# Patient Record
Sex: Male | Born: 1940 | Race: White | Hispanic: No | Marital: Married | Smoking: Former smoker
Health system: Southern US, Community
[De-identification: ages and names within clinical notes are randomized; demographics above are authoritative.]

## PROBLEM LIST (undated history)

## (undated) DIAGNOSIS — K219 Gastro-esophageal reflux disease without esophagitis: Secondary | ICD-10-CM

## (undated) DIAGNOSIS — N400 Enlarged prostate without lower urinary tract symptoms: Secondary | ICD-10-CM

## (undated) DIAGNOSIS — Z7901 Long term (current) use of anticoagulants: Secondary | ICD-10-CM

## (undated) DIAGNOSIS — I714 Abdominal aortic aneurysm, without rupture, unspecified: Secondary | ICD-10-CM

## (undated) DIAGNOSIS — Z9889 Other specified postprocedural states: Secondary | ICD-10-CM

## (undated) DIAGNOSIS — G43909 Migraine, unspecified, not intractable, without status migrainosus: Secondary | ICD-10-CM

## (undated) DIAGNOSIS — R001 Bradycardia, unspecified: Secondary | ICD-10-CM

## (undated) DIAGNOSIS — I10 Essential (primary) hypertension: Secondary | ICD-10-CM

## (undated) DIAGNOSIS — I351 Nonrheumatic aortic (valve) insufficiency: Secondary | ICD-10-CM

## (undated) DIAGNOSIS — K5790 Diverticulosis of intestine, part unspecified, without perforation or abscess without bleeding: Secondary | ICD-10-CM

## (undated) HISTORY — DX: Benign prostatic hyperplasia without lower urinary tract symptoms: N40.0

## (undated) HISTORY — DX: Essential (primary) hypertension: I10

## (undated) HISTORY — DX: Long term (current) use of anticoagulants: Z79.01

## (undated) HISTORY — DX: Gastro-esophageal reflux disease without esophagitis: K21.9

## (undated) HISTORY — PX: PROSTATE ABLATION: SHX6042

## (undated) HISTORY — DX: Migraine, unspecified, not intractable, without status migrainosus: G43.909

## (undated) HISTORY — DX: Bradycardia, unspecified: R00.1

## (undated) HISTORY — PX: TONSILLECTOMY: SUR1361

## (undated) HISTORY — DX: Abdominal aortic aneurysm, without rupture, unspecified: I71.40

## (undated) HISTORY — DX: Nonrheumatic aortic (valve) insufficiency: I35.1

## (undated) HISTORY — DX: Other specified postprocedural states: Z98.890

## (undated) HISTORY — PX: COLONOSCOPY: SHX174

## (undated) HISTORY — DX: Diverticulosis of intestine, part unspecified, without perforation or abscess without bleeding: K57.90

## (undated) HISTORY — PX: AORTIC VALVE REPLACEMENT: SHX41

## (undated) MED FILL — Decitabine For Inj 50 MG: INTRAVENOUS | Qty: 8 | Status: AC

---

## 1969-02-24 HISTORY — PX: RECONSTRUCTION MID-FACE: SUR1085

## 1999-12-24 ENCOUNTER — Encounter: Payer: Self-pay | Admitting: Cardiology

## 1999-12-24 ENCOUNTER — Encounter: Admission: RE | Admit: 1999-12-24 | Discharge: 1999-12-24 | Payer: Self-pay | Admitting: Cardiology

## 2000-12-24 ENCOUNTER — Encounter: Payer: Self-pay | Admitting: Internal Medicine

## 2000-12-24 ENCOUNTER — Ambulatory Visit (HOSPITAL_COMMUNITY): Admission: RE | Admit: 2000-12-24 | Discharge: 2000-12-24 | Payer: Self-pay | Admitting: Internal Medicine

## 2001-02-02 ENCOUNTER — Encounter: Admission: RE | Admit: 2001-02-02 | Discharge: 2001-02-02 | Payer: Self-pay | Admitting: Surgery

## 2001-02-02 ENCOUNTER — Encounter: Payer: Self-pay | Admitting: Surgery

## 2001-03-01 ENCOUNTER — Encounter: Payer: Self-pay | Admitting: Surgery

## 2001-03-02 ENCOUNTER — Inpatient Hospital Stay (HOSPITAL_COMMUNITY): Admission: RE | Admit: 2001-03-02 | Discharge: 2001-03-07 | Payer: Self-pay | Admitting: Cardiology

## 2001-03-02 ENCOUNTER — Encounter (INDEPENDENT_AMBULATORY_CARE_PROVIDER_SITE_OTHER): Payer: Self-pay | Admitting: *Deleted

## 2001-03-03 ENCOUNTER — Encounter: Payer: Self-pay | Admitting: Surgery

## 2001-03-04 ENCOUNTER — Encounter: Payer: Self-pay | Admitting: Surgery

## 2001-03-05 ENCOUNTER — Encounter: Payer: Self-pay | Admitting: Surgery

## 2001-03-23 ENCOUNTER — Encounter (HOSPITAL_COMMUNITY): Admission: RE | Admit: 2001-03-23 | Discharge: 2001-05-26 | Payer: Self-pay | Admitting: Cardiology

## 2001-03-24 ENCOUNTER — Encounter: Payer: Self-pay | Admitting: Cardiology

## 2001-03-24 ENCOUNTER — Ambulatory Visit (HOSPITAL_COMMUNITY): Admission: RE | Admit: 2001-03-24 | Discharge: 2001-03-24 | Payer: Self-pay | Admitting: Cardiology

## 2001-06-02 ENCOUNTER — Encounter (HOSPITAL_COMMUNITY): Admission: RE | Admit: 2001-06-02 | Discharge: 2001-08-31 | Payer: Self-pay | Admitting: Cardiology

## 2001-09-01 ENCOUNTER — Encounter (HOSPITAL_COMMUNITY): Admission: RE | Admit: 2001-09-01 | Discharge: 2001-11-30 | Payer: Self-pay | Admitting: Cardiology

## 2001-12-25 ENCOUNTER — Encounter (HOSPITAL_COMMUNITY): Admission: RE | Admit: 2001-12-25 | Discharge: 2002-03-25 | Payer: Self-pay | Admitting: Cardiology

## 2004-01-10 ENCOUNTER — Ambulatory Visit: Payer: Self-pay | Admitting: Internal Medicine

## 2004-01-15 ENCOUNTER — Ambulatory Visit: Payer: Self-pay | Admitting: Internal Medicine

## 2004-01-23 ENCOUNTER — Ambulatory Visit: Payer: Self-pay | Admitting: Cardiovascular Disease

## 2004-01-23 ENCOUNTER — Ambulatory Visit: Payer: Self-pay | Admitting: Cardiology

## 2004-01-26 ENCOUNTER — Ambulatory Visit: Payer: Self-pay

## 2004-02-01 ENCOUNTER — Ambulatory Visit: Payer: Self-pay | Admitting: Cardiology

## 2004-02-15 ENCOUNTER — Ambulatory Visit: Payer: Self-pay | Admitting: Internal Medicine

## 2004-02-28 ENCOUNTER — Ambulatory Visit: Payer: Self-pay | Admitting: Cardiology

## 2004-03-04 ENCOUNTER — Ambulatory Visit: Payer: Self-pay | Admitting: Internal Medicine

## 2004-03-07 ENCOUNTER — Ambulatory Visit: Payer: Self-pay | Admitting: Internal Medicine

## 2004-03-14 ENCOUNTER — Ambulatory Visit: Payer: Self-pay | Admitting: Internal Medicine

## 2004-04-15 ENCOUNTER — Ambulatory Visit: Payer: Self-pay | Admitting: Cardiology

## 2004-04-29 ENCOUNTER — Ambulatory Visit: Payer: Self-pay | Admitting: Cardiology

## 2004-05-10 ENCOUNTER — Ambulatory Visit: Payer: Self-pay | Admitting: Internal Medicine

## 2004-05-28 ENCOUNTER — Ambulatory Visit: Payer: Self-pay | Admitting: Cardiology

## 2004-06-20 ENCOUNTER — Ambulatory Visit: Payer: Self-pay | Admitting: Cardiology

## 2004-07-10 ENCOUNTER — Ambulatory Visit: Payer: Self-pay | Admitting: Internal Medicine

## 2004-07-24 ENCOUNTER — Ambulatory Visit: Payer: Self-pay | Admitting: Cardiology

## 2004-08-21 ENCOUNTER — Ambulatory Visit: Payer: Self-pay | Admitting: Cardiology

## 2004-09-20 ENCOUNTER — Ambulatory Visit: Payer: Self-pay | Admitting: Cardiology

## 2004-10-04 ENCOUNTER — Ambulatory Visit: Payer: Self-pay | Admitting: Cardiology

## 2004-10-30 ENCOUNTER — Ambulatory Visit: Payer: Self-pay | Admitting: Cardiology

## 2004-11-22 ENCOUNTER — Ambulatory Visit: Payer: Self-pay | Admitting: Cardiology

## 2004-12-19 ENCOUNTER — Ambulatory Visit: Payer: Self-pay | Admitting: Cardiology

## 2004-12-19 ENCOUNTER — Ambulatory Visit: Payer: Self-pay | Admitting: Internal Medicine

## 2004-12-25 ENCOUNTER — Ambulatory Visit: Payer: Self-pay | Admitting: Internal Medicine

## 2004-12-27 ENCOUNTER — Ambulatory Visit: Payer: Self-pay | Admitting: Cardiology

## 2005-01-24 ENCOUNTER — Ambulatory Visit: Payer: Self-pay | Admitting: Internal Medicine

## 2005-01-31 ENCOUNTER — Ambulatory Visit: Payer: Self-pay | Admitting: Cardiology

## 2005-02-14 ENCOUNTER — Ambulatory Visit: Payer: Self-pay | Admitting: Cardiology

## 2005-02-28 ENCOUNTER — Ambulatory Visit: Payer: Self-pay | Admitting: Cardiology

## 2005-03-19 ENCOUNTER — Ambulatory Visit: Payer: Self-pay | Admitting: Internal Medicine

## 2005-03-28 ENCOUNTER — Ambulatory Visit: Payer: Self-pay | Admitting: Cardiology

## 2005-04-11 ENCOUNTER — Ambulatory Visit: Payer: Self-pay | Admitting: Internal Medicine

## 2005-04-28 ENCOUNTER — Ambulatory Visit: Payer: Self-pay | Admitting: Cardiology

## 2005-05-12 ENCOUNTER — Ambulatory Visit: Payer: Self-pay | Admitting: Cardiology

## 2005-05-29 ENCOUNTER — Ambulatory Visit: Payer: Self-pay | Admitting: Cardiology

## 2005-06-09 ENCOUNTER — Ambulatory Visit: Payer: Self-pay | Admitting: Endocrinology

## 2005-06-13 ENCOUNTER — Ambulatory Visit: Payer: Self-pay | Admitting: Internal Medicine

## 2005-06-26 ENCOUNTER — Ambulatory Visit: Payer: Self-pay | Admitting: Cardiology

## 2005-07-24 ENCOUNTER — Ambulatory Visit: Payer: Self-pay | Admitting: Cardiology

## 2005-08-15 ENCOUNTER — Ambulatory Visit: Payer: Self-pay | Admitting: Cardiology

## 2005-09-08 ENCOUNTER — Ambulatory Visit: Payer: Self-pay | Admitting: Cardiology

## 2005-09-19 ENCOUNTER — Ambulatory Visit: Payer: Self-pay | Admitting: Internal Medicine

## 2005-10-03 ENCOUNTER — Ambulatory Visit: Payer: Self-pay | Admitting: Internal Medicine

## 2005-10-17 ENCOUNTER — Ambulatory Visit: Payer: Self-pay | Admitting: Cardiology

## 2005-10-30 ENCOUNTER — Ambulatory Visit: Payer: Self-pay | Admitting: Cardiology

## 2005-11-20 ENCOUNTER — Ambulatory Visit: Payer: Self-pay | Admitting: Cardiology

## 2005-12-04 ENCOUNTER — Ambulatory Visit: Payer: Self-pay | Admitting: Cardiology

## 2005-12-17 ENCOUNTER — Ambulatory Visit: Payer: Self-pay | Admitting: Internal Medicine

## 2006-01-05 ENCOUNTER — Ambulatory Visit: Payer: Self-pay | Admitting: Cardiology

## 2006-01-12 ENCOUNTER — Ambulatory Visit: Payer: Self-pay | Admitting: Internal Medicine

## 2006-01-12 LAB — CONVERTED CEMR LAB
AST: 32 units/L (ref 0–37)
Albumin: 3.9 g/dL (ref 3.5–5.2)
BUN: 14 mg/dL (ref 6–23)
Calcium: 9.1 mg/dL (ref 8.4–10.5)
Chol/HDL Ratio, serum: 3.2
Cholesterol: 143 mg/dL (ref 0–200)
Creatinine, Ser: 1.3 mg/dL (ref 0.4–1.5)
Eosinophil percent: 2.1 % (ref 0.0–5.0)
HCT: 47.6 % (ref 39.0–52.0)
HDL: 45.3 mg/dL (ref >39.0)
Hemoglobin: 15.9 g/dL (ref 13.0–17.0)
LDL Cholesterol: 82 mg/dL (ref 0–99)
Lymphocytes Relative: 14 % (ref 12.0–46.0)
MCHC: 33.5 g/dL (ref 30.0–36.0)
Neutrophils Relative %: 75.2 % (ref 43.0–77.0)
Nitrite: NEGATIVE
PSA: 6.09 ng/mL — ABNORMAL HIGH (ref 0.10–4.00)
Potassium: 4.4 meq/L (ref 3.5–5.1)
RDW: 12 % (ref 11.5–14.6)
Total Bilirubin: 1.1 mg/dL (ref 0.3–1.2)
Total Protein: 6.1 g/dL (ref 6.0–8.3)
Triglyceride fasting, serum: 80 mg/dL (ref 0–149)
Urine Glucose: NEGATIVE mg/dL

## 2006-01-19 ENCOUNTER — Ambulatory Visit: Payer: Self-pay | Admitting: Internal Medicine

## 2006-01-20 ENCOUNTER — Ambulatory Visit: Payer: Self-pay | Admitting: Cardiology

## 2006-01-27 ENCOUNTER — Encounter: Payer: Self-pay | Admitting: Internal Medicine

## 2006-01-27 ENCOUNTER — Ambulatory Visit: Payer: Self-pay

## 2006-02-10 ENCOUNTER — Ambulatory Visit: Payer: Self-pay | Admitting: Cardiology

## 2006-02-13 ENCOUNTER — Ambulatory Visit: Payer: Self-pay | Admitting: Internal Medicine

## 2006-02-16 ENCOUNTER — Ambulatory Visit (HOSPITAL_COMMUNITY): Admission: RE | Admit: 2006-02-16 | Discharge: 2006-02-16 | Payer: Self-pay | Admitting: Internal Medicine

## 2006-02-26 ENCOUNTER — Ambulatory Visit: Payer: Self-pay | Admitting: Cardiology

## 2006-03-09 ENCOUNTER — Encounter (INDEPENDENT_AMBULATORY_CARE_PROVIDER_SITE_OTHER): Payer: Self-pay | Admitting: *Deleted

## 2006-03-09 ENCOUNTER — Ambulatory Visit (HOSPITAL_COMMUNITY): Admission: RE | Admit: 2006-03-09 | Discharge: 2006-03-09 | Payer: Self-pay | Admitting: Urology

## 2006-03-11 ENCOUNTER — Ambulatory Visit: Payer: Self-pay | Admitting: Internal Medicine

## 2006-03-17 ENCOUNTER — Ambulatory Visit: Payer: Self-pay | Admitting: Cardiology

## 2006-03-23 ENCOUNTER — Ambulatory Visit: Payer: Self-pay | Admitting: Cardiology

## 2006-04-06 ENCOUNTER — Ambulatory Visit: Payer: Self-pay | Admitting: Cardiology

## 2006-04-17 ENCOUNTER — Ambulatory Visit: Payer: Self-pay | Admitting: Internal Medicine

## 2006-05-01 ENCOUNTER — Ambulatory Visit: Payer: Self-pay | Admitting: Internal Medicine

## 2006-05-01 ENCOUNTER — Ambulatory Visit: Payer: Self-pay | Admitting: Cardiology

## 2006-05-22 ENCOUNTER — Ambulatory Visit: Payer: Self-pay | Admitting: Cardiology

## 2006-06-18 ENCOUNTER — Ambulatory Visit (HOSPITAL_COMMUNITY): Admission: RE | Admit: 2006-06-18 | Discharge: 2006-06-18 | Payer: Self-pay | Admitting: Internal Medicine

## 2006-06-18 ENCOUNTER — Ambulatory Visit: Payer: Self-pay | Admitting: Internal Medicine

## 2006-06-19 ENCOUNTER — Ambulatory Visit: Payer: Self-pay | Admitting: Cardiology

## 2006-07-08 ENCOUNTER — Ambulatory Visit: Payer: Self-pay | Admitting: Internal Medicine

## 2006-07-17 ENCOUNTER — Ambulatory Visit: Payer: Self-pay | Admitting: Cardiovascular Disease

## 2006-08-14 ENCOUNTER — Ambulatory Visit: Payer: Self-pay | Admitting: Cardiology

## 2006-09-11 ENCOUNTER — Ambulatory Visit: Payer: Self-pay | Admitting: Cardiovascular Disease

## 2006-10-13 ENCOUNTER — Ambulatory Visit: Payer: Self-pay | Admitting: Cardiology

## 2006-11-10 ENCOUNTER — Ambulatory Visit: Payer: Self-pay | Admitting: Cardiology

## 2006-11-18 ENCOUNTER — Encounter: Payer: Self-pay | Admitting: *Deleted

## 2006-11-18 DIAGNOSIS — R972 Elevated prostate specific antigen [PSA]: Secondary | ICD-10-CM | POA: Insufficient documentation

## 2006-11-18 DIAGNOSIS — G43909 Migraine, unspecified, not intractable, without status migrainosus: Secondary | ICD-10-CM | POA: Insufficient documentation

## 2006-11-18 DIAGNOSIS — I10 Essential (primary) hypertension: Secondary | ICD-10-CM | POA: Insufficient documentation

## 2006-11-18 DIAGNOSIS — E785 Hyperlipidemia, unspecified: Secondary | ICD-10-CM | POA: Insufficient documentation

## 2006-12-08 ENCOUNTER — Ambulatory Visit: Payer: Self-pay | Admitting: Cardiovascular Disease

## 2006-12-28 ENCOUNTER — Ambulatory Visit: Payer: Self-pay | Admitting: Internal Medicine

## 2006-12-28 LAB — CONVERTED CEMR LAB
ALT: 36 units/L (ref 0–53)
Bilirubin Urine: NEGATIVE
Bilirubin, Direct: 0.1 mg/dL (ref 0.0–0.3)
Calcium: 9.5 mg/dL (ref 8.4–10.5)
Cholesterol: 168 mg/dL (ref 0–200)
Eosinophils Absolute: 0.1 10*3/uL (ref 0.0–0.6)
Eosinophils Relative: 1.4 % (ref 0.0–5.0)
GFR calc Af Amer: 78 mL/min
GFR calc non Af Amer: 65 mL/min
Glucose, Bld: 96 mg/dL (ref 70–99)
HDL: 52.1 mg/dL (ref >39.0)
Hemoglobin, Urine: NEGATIVE
Lymphocytes Relative: 11.3 % — ABNORMAL LOW (ref 12.0–46.0)
MCV: 93.8 fL (ref 78.0–100.0)
Monocytes Relative: 9.3 % (ref 3.0–11.0)
Neutro Abs: 5.9 10*3/uL (ref 1.4–7.7)
PSA: 2.65 ng/mL (ref 0.10–4.00)
Platelets: 191 10*3/uL (ref 150–400)
TSH: 0.68 microintl units/mL (ref 0.35–5.50)
Total CHOL/HDL Ratio: 3.2
Triglycerides: 139 mg/dL (ref 0–149)
Urine Glucose: NEGATIVE mg/dL
WBC: 7.5 10*3/uL (ref 4.5–10.5)

## 2007-01-08 ENCOUNTER — Ambulatory Visit: Payer: Self-pay | Admitting: Cardiovascular Disease

## 2007-01-11 ENCOUNTER — Ambulatory Visit: Payer: Self-pay | Admitting: Cardiology

## 2007-02-05 ENCOUNTER — Ambulatory Visit: Payer: Self-pay | Admitting: Internal Medicine

## 2007-03-05 ENCOUNTER — Ambulatory Visit: Payer: Self-pay | Admitting: Cardiology

## 2007-03-12 ENCOUNTER — Ambulatory Visit: Payer: Self-pay | Admitting: Internal Medicine

## 2007-03-12 DIAGNOSIS — R519 Headache, unspecified: Secondary | ICD-10-CM | POA: Insufficient documentation

## 2007-03-12 DIAGNOSIS — N400 Enlarged prostate without lower urinary tract symptoms: Secondary | ICD-10-CM | POA: Insufficient documentation

## 2007-03-12 DIAGNOSIS — R51 Headache: Secondary | ICD-10-CM

## 2007-04-07 ENCOUNTER — Ambulatory Visit: Payer: Self-pay | Admitting: Cardiovascular Disease

## 2007-05-11 ENCOUNTER — Ambulatory Visit: Payer: Self-pay | Admitting: Cardiology

## 2007-06-08 ENCOUNTER — Ambulatory Visit: Payer: Self-pay | Admitting: Cardiology

## 2007-07-06 ENCOUNTER — Ambulatory Visit: Payer: Self-pay | Admitting: Cardiology

## 2007-07-23 ENCOUNTER — Ambulatory Visit: Payer: Self-pay | Admitting: Cardiology

## 2007-07-26 ENCOUNTER — Ambulatory Visit: Payer: Self-pay | Admitting: Cardiology

## 2007-07-29 ENCOUNTER — Telehealth (INDEPENDENT_AMBULATORY_CARE_PROVIDER_SITE_OTHER): Payer: Self-pay | Admitting: *Deleted

## 2007-08-13 ENCOUNTER — Ambulatory Visit: Payer: Self-pay | Admitting: Cardiology

## 2007-09-10 ENCOUNTER — Ambulatory Visit: Payer: Self-pay | Admitting: Cardiovascular Disease

## 2007-09-13 ENCOUNTER — Ambulatory Visit: Payer: Self-pay | Admitting: Internal Medicine

## 2007-09-13 DIAGNOSIS — N281 Cyst of kidney, acquired: Secondary | ICD-10-CM | POA: Insufficient documentation

## 2007-09-13 DIAGNOSIS — M79609 Pain in unspecified limb: Secondary | ICD-10-CM | POA: Insufficient documentation

## 2007-09-14 ENCOUNTER — Encounter: Payer: Self-pay | Admitting: Internal Medicine

## 2007-09-15 LAB — CONVERTED CEMR LAB
BUN: 12 mg/dL (ref 6–23)
Basophils Relative: 1.1 % (ref 0.0–3.0)
CO2: 32 meq/L (ref 19–32)
Chloride: 106 meq/L (ref 96–112)
Creatinine, Ser: 1.2 mg/dL (ref 0.4–1.5)
Eosinophils Relative: 2.6 % (ref 0.0–5.0)
Glucose, Bld: 72 mg/dL (ref 70–99)
Lymphocytes Relative: 11.8 % — ABNORMAL LOW (ref 12.0–46.0)
MCV: 95.4 fL (ref 78.0–100.0)
Monocytes Relative: 6.6 % (ref 3.0–12.0)
Neutrophils Relative %: 77.9 % — ABNORMAL HIGH (ref 43.0–77.0)
RBC: 4.95 M/uL (ref 4.22–5.81)
WBC: 7.6 10*3/uL (ref 4.5–10.5)

## 2007-09-21 ENCOUNTER — Encounter: Payer: Self-pay | Admitting: Internal Medicine

## 2007-10-08 ENCOUNTER — Ambulatory Visit: Payer: Self-pay | Admitting: Cardiovascular Disease

## 2007-10-29 ENCOUNTER — Ambulatory Visit: Payer: Self-pay | Admitting: Cardiovascular Disease

## 2007-11-11 ENCOUNTER — Telehealth: Payer: Self-pay | Admitting: Internal Medicine

## 2007-11-12 ENCOUNTER — Ambulatory Visit: Payer: Self-pay | Admitting: Internal Medicine

## 2007-11-24 ENCOUNTER — Ambulatory Visit: Payer: Self-pay | Admitting: Internal Medicine

## 2007-11-24 DIAGNOSIS — M542 Cervicalgia: Secondary | ICD-10-CM | POA: Insufficient documentation

## 2007-12-10 ENCOUNTER — Ambulatory Visit: Payer: Self-pay | Admitting: Cardiology

## 2008-01-07 ENCOUNTER — Ambulatory Visit: Payer: Self-pay | Admitting: Cardiovascular Disease

## 2008-02-04 ENCOUNTER — Ambulatory Visit: Payer: Self-pay | Admitting: Cardiology

## 2008-02-04 ENCOUNTER — Ambulatory Visit: Payer: Self-pay | Admitting: Internal Medicine

## 2008-02-04 DIAGNOSIS — M545 Low back pain, unspecified: Secondary | ICD-10-CM | POA: Insufficient documentation

## 2008-02-29 ENCOUNTER — Ambulatory Visit: Payer: Self-pay | Admitting: Cardiology

## 2008-02-29 ENCOUNTER — Encounter: Payer: Self-pay | Admitting: Cardiology

## 2008-03-01 ENCOUNTER — Ambulatory Visit: Payer: Self-pay | Admitting: Internal Medicine

## 2008-03-02 ENCOUNTER — Ambulatory Visit: Payer: Self-pay | Admitting: Cardiology

## 2008-03-03 LAB — CONVERTED CEMR LAB
ALT: 41 units/L (ref 0–53)
Basophils Absolute: 0 10*3/uL (ref 0.0–0.1)
Bilirubin Urine: NEGATIVE
Bilirubin, Direct: 0.1 mg/dL (ref 0.0–0.3)
CO2: 32 meq/L (ref 19–32)
Calcium: 9.5 mg/dL (ref 8.4–10.5)
Creatinine, Ser: 1.2 mg/dL (ref 0.4–1.5)
Eosinophils Absolute: 0.1 10*3/uL (ref 0.0–0.7)
GFR calc Af Amer: 78 mL/min
GFR calc non Af Amer: 65 mL/min
HDL: 50.8 mg/dL (ref >39.0)
Hemoglobin: 16.3 g/dL (ref 13.0–17.0)
Ketones, ur: NEGATIVE mg/dL
LDL Cholesterol: 78 mg/dL (ref 0–99)
Leukocytes, UA: NEGATIVE
Lymphocytes Relative: 11.8 % — ABNORMAL LOW (ref 12.0–46.0)
MCHC: 35.1 g/dL (ref 30.0–36.0)
MCV: 95.4 fL (ref 78.0–100.0)
Neutro Abs: 6.2 10*3/uL (ref 1.4–7.7)
PSA: 3.5 ng/mL (ref 0.10–4.00)
RDW: 12.2 % (ref 11.5–14.6)
Sodium: 140 meq/L (ref 135–145)
TSH: 0.67 microintl units/mL (ref 0.35–5.50)
Total Bilirubin: 1.3 mg/dL — ABNORMAL HIGH (ref 0.3–1.2)
Total CHOL/HDL Ratio: 3
Triglycerides: 123 mg/dL (ref 0–149)
Urine Glucose: NEGATIVE mg/dL
Urobilinogen, UA: 0.2 (ref 0.0–1.0)
pH: 6 (ref 5.0–8.0)

## 2008-03-13 ENCOUNTER — Ambulatory Visit: Payer: Self-pay | Admitting: Internal Medicine

## 2008-03-23 ENCOUNTER — Ambulatory Visit: Payer: Self-pay | Admitting: Cardiology

## 2008-04-19 ENCOUNTER — Ambulatory Visit: Payer: Self-pay | Admitting: Cardiology

## 2008-05-15 ENCOUNTER — Ambulatory Visit: Payer: Self-pay | Admitting: Internal Medicine

## 2008-05-15 ENCOUNTER — Encounter: Payer: Self-pay | Admitting: Internal Medicine

## 2008-05-15 ENCOUNTER — Ambulatory Visit: Payer: Self-pay | Admitting: Cardiology

## 2008-05-15 DIAGNOSIS — D485 Neoplasm of uncertain behavior of skin: Secondary | ICD-10-CM | POA: Insufficient documentation

## 2008-06-09 ENCOUNTER — Ambulatory Visit: Payer: Self-pay | Admitting: Internal Medicine

## 2008-07-07 ENCOUNTER — Ambulatory Visit: Payer: Self-pay | Admitting: Cardiology

## 2008-07-18 ENCOUNTER — Encounter (INDEPENDENT_AMBULATORY_CARE_PROVIDER_SITE_OTHER): Payer: Self-pay | Admitting: *Deleted

## 2008-07-21 ENCOUNTER — Ambulatory Visit: Payer: Self-pay | Admitting: Internal Medicine

## 2008-07-21 LAB — CONVERTED CEMR LAB
POC INR: 1.9
Protime: 19.1

## 2008-07-25 ENCOUNTER — Encounter: Payer: Self-pay | Admitting: *Deleted

## 2008-08-11 ENCOUNTER — Ambulatory Visit: Payer: Self-pay | Admitting: Internal Medicine

## 2008-08-11 LAB — CONVERTED CEMR LAB
POC INR: 2.8
Protime: 20.2

## 2008-08-30 ENCOUNTER — Encounter: Payer: Self-pay | Admitting: *Deleted

## 2008-09-01 ENCOUNTER — Ambulatory Visit: Payer: Self-pay | Admitting: Internal Medicine

## 2008-09-01 DIAGNOSIS — D179 Benign lipomatous neoplasm, unspecified: Secondary | ICD-10-CM | POA: Insufficient documentation

## 2008-09-01 LAB — CONVERTED CEMR LAB
ALT: 29 units/L (ref 0–53)
BUN: 20 mg/dL (ref 6–23)
Basophils Relative: 0.2 % (ref 0.0–3.0)
Bilirubin, Direct: 0.2 mg/dL (ref 0.0–0.3)
Calcium: 9.7 mg/dL (ref 8.4–10.5)
Creatinine, Ser: 1.3 mg/dL (ref 0.4–1.5)
Eosinophils Absolute: 0.1 10*3/uL (ref 0.0–0.7)
Eosinophils Relative: 1.8 % (ref 0.0–5.0)
GFR calc non Af Amer: 58.68 mL/min (ref >60)
Hemoglobin: 16.9 g/dL (ref 13.0–17.0)
Lymphocytes Relative: 15.1 % (ref 12.0–46.0)
MCHC: 34.9 g/dL (ref 30.0–36.0)
MCV: 94.5 fL (ref 78.0–100.0)
Monocytes Absolute: 0.7 10*3/uL (ref 0.1–1.0)
Neutro Abs: 4.7 10*3/uL (ref 1.4–7.7)
Neutrophils Relative %: 72.2 % (ref 43.0–77.0)
PSA: 2.41 ng/mL (ref 0.10–4.00)
RBC: 5.14 M/uL (ref 4.22–5.81)
Total Bilirubin: 1.3 mg/dL — ABNORMAL HIGH (ref 0.3–1.2)
WBC: 6.5 10*3/uL (ref 4.5–10.5)

## 2008-09-05 ENCOUNTER — Encounter (INDEPENDENT_AMBULATORY_CARE_PROVIDER_SITE_OTHER): Payer: Self-pay | Admitting: Cardiology

## 2008-09-05 ENCOUNTER — Ambulatory Visit: Payer: Self-pay | Admitting: Cardiovascular Disease

## 2008-09-05 DIAGNOSIS — I712 Thoracic aortic aneurysm, without rupture, unspecified: Secondary | ICD-10-CM | POA: Insufficient documentation

## 2008-09-05 DIAGNOSIS — I359 Nonrheumatic aortic valve disorder, unspecified: Secondary | ICD-10-CM | POA: Insufficient documentation

## 2008-09-05 LAB — CONVERTED CEMR LAB: Prothrombin Time: 21.8 s

## 2008-09-13 ENCOUNTER — Ambulatory Visit: Payer: Self-pay | Admitting: Cardiology

## 2008-09-28 ENCOUNTER — Ambulatory Visit: Payer: Self-pay | Admitting: Cardiology

## 2008-09-28 LAB — CONVERTED CEMR LAB
POC INR: 1.8
Prothrombin Time: 16.4 s

## 2008-10-19 ENCOUNTER — Ambulatory Visit: Payer: Self-pay | Admitting: Internal Medicine

## 2008-10-19 LAB — CONVERTED CEMR LAB: POC INR: 2.2

## 2008-11-03 ENCOUNTER — Ambulatory Visit: Payer: Self-pay | Admitting: Internal Medicine

## 2008-11-03 ENCOUNTER — Encounter: Payer: Self-pay | Admitting: Internal Medicine

## 2008-11-03 DIAGNOSIS — L570 Actinic keratosis: Secondary | ICD-10-CM | POA: Insufficient documentation

## 2008-11-03 DIAGNOSIS — J3489 Other specified disorders of nose and nasal sinuses: Secondary | ICD-10-CM | POA: Insufficient documentation

## 2008-11-24 ENCOUNTER — Ambulatory Visit: Payer: Self-pay | Admitting: Internal Medicine

## 2008-12-15 ENCOUNTER — Ambulatory Visit: Payer: Self-pay | Admitting: Cardiology

## 2008-12-25 ENCOUNTER — Encounter (INDEPENDENT_AMBULATORY_CARE_PROVIDER_SITE_OTHER): Payer: Self-pay | Admitting: *Deleted

## 2008-12-27 ENCOUNTER — Encounter: Payer: Self-pay | Admitting: Internal Medicine

## 2009-01-02 ENCOUNTER — Ambulatory Visit: Payer: Self-pay | Admitting: Cardiology

## 2009-01-23 ENCOUNTER — Ambulatory Visit: Payer: Self-pay | Admitting: Cardiovascular Disease

## 2009-02-20 ENCOUNTER — Encounter (INDEPENDENT_AMBULATORY_CARE_PROVIDER_SITE_OTHER): Payer: Self-pay | Admitting: *Deleted

## 2009-02-20 ENCOUNTER — Ambulatory Visit: Payer: Self-pay | Admitting: Cardiovascular Disease

## 2009-02-27 ENCOUNTER — Ambulatory Visit: Payer: Self-pay | Admitting: Cardiology

## 2009-03-05 ENCOUNTER — Ambulatory Visit: Payer: Self-pay | Admitting: Internal Medicine

## 2009-03-05 LAB — CONVERTED CEMR LAB
BUN: 14 mg/dL (ref 6–23)
CO2: 32 meq/L (ref 19–32)
Glucose, Bld: 103 mg/dL — ABNORMAL HIGH (ref 70–99)
Hgb A1c MFr Bld: 5.4 % (ref 4.6–6.5)
Ketones, ur: NEGATIVE mg/dL
PSA: 2.63 ng/mL (ref 0.10–4.00)
Potassium: 4.9 meq/L (ref 3.5–5.1)
Sodium: 140 meq/L (ref 135–145)
Specific Gravity, Urine: 1.025 (ref 1.000–1.030)
Total Protein, Urine: NEGATIVE mg/dL
Urine Glucose: NEGATIVE mg/dL
pH: 5.5 (ref 5.0–8.0)

## 2009-03-09 ENCOUNTER — Ambulatory Visit: Payer: Self-pay | Admitting: Internal Medicine

## 2009-03-09 DIAGNOSIS — S91309A Unspecified open wound, unspecified foot, initial encounter: Secondary | ICD-10-CM | POA: Insufficient documentation

## 2009-03-20 ENCOUNTER — Ambulatory Visit (HOSPITAL_COMMUNITY): Admission: RE | Admit: 2009-03-20 | Discharge: 2009-03-20 | Payer: Self-pay | Admitting: Cardiology

## 2009-03-20 ENCOUNTER — Ambulatory Visit: Payer: Self-pay | Admitting: Cardiovascular Disease

## 2009-03-20 ENCOUNTER — Encounter: Payer: Self-pay | Admitting: Cardiology

## 2009-03-20 ENCOUNTER — Ambulatory Visit: Payer: Self-pay

## 2009-03-20 LAB — CONVERTED CEMR LAB: POC INR: 2.1

## 2009-04-17 ENCOUNTER — Ambulatory Visit: Payer: Self-pay | Admitting: Cardiology

## 2009-04-17 LAB — CONVERTED CEMR LAB: POC INR: 1.5

## 2009-05-01 ENCOUNTER — Encounter (INDEPENDENT_AMBULATORY_CARE_PROVIDER_SITE_OTHER): Payer: Self-pay | Admitting: Cardiology

## 2009-05-01 ENCOUNTER — Ambulatory Visit: Payer: Self-pay | Admitting: Cardiovascular Disease

## 2009-05-01 LAB — CONVERTED CEMR LAB: POC INR: 1.9

## 2009-05-21 ENCOUNTER — Ambulatory Visit: Payer: Self-pay | Admitting: Internal Medicine

## 2009-06-04 ENCOUNTER — Encounter: Payer: Self-pay | Admitting: Internal Medicine

## 2009-06-11 ENCOUNTER — Ambulatory Visit: Payer: Self-pay | Admitting: Cardiology

## 2009-07-05 ENCOUNTER — Ambulatory Visit: Payer: Self-pay | Admitting: Internal Medicine

## 2009-08-02 ENCOUNTER — Ambulatory Visit: Payer: Self-pay | Admitting: Cardiovascular Disease

## 2009-08-02 LAB — CONVERTED CEMR LAB: POC INR: 2.2

## 2009-08-30 ENCOUNTER — Ambulatory Visit: Payer: Self-pay | Admitting: Internal Medicine

## 2009-09-13 ENCOUNTER — Ambulatory Visit: Payer: Self-pay | Admitting: Cardiology

## 2009-09-14 ENCOUNTER — Ambulatory Visit: Payer: Self-pay | Admitting: Cardiology

## 2009-09-14 DIAGNOSIS — I498 Other specified cardiac arrhythmias: Secondary | ICD-10-CM | POA: Insufficient documentation

## 2009-09-14 DIAGNOSIS — I440 Atrioventricular block, first degree: Secondary | ICD-10-CM | POA: Insufficient documentation

## 2009-09-14 LAB — CONVERTED CEMR LAB
AST: 26 units/L (ref 0–37)
Albumin: 3.9 g/dL (ref 3.5–5.2)
HDL: 48.3 mg/dL (ref >39.00)
LDL Cholesterol: 85 mg/dL (ref 0–99)
Total CHOL/HDL Ratio: 3
Triglycerides: 99 mg/dL (ref 0.0–149.0)
VLDL: 19.8 mg/dL (ref 0.0–40.0)

## 2009-09-18 ENCOUNTER — Ambulatory Visit: Payer: Self-pay | Admitting: Internal Medicine

## 2009-09-18 DIAGNOSIS — R21 Rash and other nonspecific skin eruption: Secondary | ICD-10-CM | POA: Insufficient documentation

## 2009-09-19 LAB — CONVERTED CEMR LAB
Basophils Relative: 0.4 % (ref 0.0–3.0)
CO2: 35 meq/L — ABNORMAL HIGH (ref 19–32)
Calcium: 10.1 mg/dL (ref 8.4–10.5)
Hemoglobin: 16 g/dL (ref 13.0–17.0)
Lymphocytes Relative: 14.8 % (ref 12.0–46.0)
MCHC: 34.6 g/dL (ref 30.0–36.0)
Monocytes Relative: 11.2 % (ref 3.0–12.0)
Neutro Abs: 5.1 10*3/uL (ref 1.4–7.7)
PSA: 2.27 ng/mL (ref 0.10–4.00)
Potassium: 5.4 meq/L — ABNORMAL HIGH (ref 3.5–5.1)
RBC: 4.86 M/uL (ref 4.22–5.81)
Sodium: 140 meq/L (ref 135–145)
Specific Gravity, Urine: 1.015 (ref 1.000–1.030)
Total Protein, Urine: NEGATIVE mg/dL
Urine Glucose: NEGATIVE mg/dL

## 2009-09-27 ENCOUNTER — Ambulatory Visit: Payer: Self-pay | Admitting: Internal Medicine

## 2009-09-27 LAB — CONVERTED CEMR LAB: POC INR: 2.4

## 2009-10-25 ENCOUNTER — Ambulatory Visit: Payer: Self-pay | Admitting: Cardiology

## 2009-10-25 LAB — CONVERTED CEMR LAB: POC INR: 2.5

## 2009-11-26 ENCOUNTER — Ambulatory Visit: Payer: Self-pay | Admitting: Internal Medicine

## 2009-11-26 LAB — CONVERTED CEMR LAB: POC INR: 2.7

## 2009-11-28 ENCOUNTER — Ambulatory Visit: Payer: Self-pay | Admitting: Internal Medicine

## 2009-11-28 DIAGNOSIS — K140 Glossitis: Secondary | ICD-10-CM | POA: Insufficient documentation

## 2009-12-24 ENCOUNTER — Ambulatory Visit: Payer: Self-pay | Admitting: Internal Medicine

## 2009-12-24 LAB — CONVERTED CEMR LAB: POC INR: 2.2

## 2010-01-21 ENCOUNTER — Telehealth: Payer: Self-pay | Admitting: Internal Medicine

## 2010-01-21 ENCOUNTER — Ambulatory Visit: Payer: Self-pay | Admitting: Internal Medicine

## 2010-02-19 ENCOUNTER — Ambulatory Visit: Payer: Self-pay | Admitting: Cardiology

## 2010-03-05 ENCOUNTER — Encounter: Payer: Self-pay | Admitting: Cardiology

## 2010-03-05 ENCOUNTER — Ambulatory Visit
Admission: RE | Admit: 2010-03-05 | Discharge: 2010-03-05 | Payer: Self-pay | Source: Home / Self Care | Attending: Cardiology | Admitting: Cardiology

## 2010-03-17 ENCOUNTER — Encounter: Payer: Self-pay | Admitting: Internal Medicine

## 2010-03-18 ENCOUNTER — Ambulatory Visit: Admit: 2010-03-18 | Payer: Self-pay | Admitting: Cardiology

## 2010-03-19 ENCOUNTER — Ambulatory Visit
Admission: RE | Admit: 2010-03-19 | Discharge: 2010-03-19 | Payer: Self-pay | Source: Home / Self Care | Attending: Internal Medicine | Admitting: Internal Medicine

## 2010-03-19 ENCOUNTER — Ambulatory Visit
Admission: RE | Admit: 2010-03-19 | Discharge: 2010-03-19 | Payer: Self-pay | Source: Home / Self Care | Attending: Cardiovascular Disease | Admitting: Cardiovascular Disease

## 2010-03-21 ENCOUNTER — Encounter: Payer: Self-pay | Admitting: Gastroenterology

## 2010-03-26 NOTE — Medication Information (Signed)
Summary: rov/tm      Allergies Added:  Anticoagulant Therapy  Managed by: Reina Fuse, PharmD Referring MD: Valera Castle MD PCP: Georgina Quint [name] MD Supervising MD: Gala Romney MD, Reuel Boom Indication 1: Aortic Valve Replacement (ICD-V43.3) Indication 2: Aortic Arch Aneurysm (ICD-Aortic) Lab Used: [facility] Site: [facility] INR POC 2.7 INR RANGE 2 - 3  Dietary changes: no    Health status changes: no    Bleeding/hemorrhagic complications: no    Recent/future hospitalizations: no    Any changes in medication regimen? no    Recent/future dental: no  Any missed doses?: no       Is patient compliant with meds? yes       Current Medications (verified): 1)  Warfarin Sodium 5 Mg  Tabs (Warfarin Sodium) .... Take As Diected 2)  Tenormin 25 Mg  Tabs (Atenolol) .... Take 1 By Mouth Qd 3)  Lipitor 10 Mg  Tabs (Atorvastatin Calcium) .... Take 1 By Mouth Qd 4)  Imitrex 50 Mg  Tabs (Sumatriptan Succinate) .... Take 1 By Mouth Once Daily As Needed 5)  Avodart 0.5 Mg Caps (Dutasteride) .Marland Kitchen.. 1 By Mouth Qd 6)  Vitamin D3 1000 Unit  Tabs (Cholecalciferol) .Marland Kitchen.. 1 Once Daily 7)  Voltaren 1 %  Gel (Diclofenac Sodium) .... Two Times A Day  To Qid As Needed 8)  Lomotil 2.5-0.025 Mg Tabs (Diphenoxylate-Atropine) .Marland Kitchen.. 1-2 By Mouth Qid As Needed Diarrhea 9)  Ciprofloxacin Hcl 500 Mg Tabs (Ciprofloxacin Hcl) .... As Needed 10)  Metronidazole 500 Mg Tabs (Metronidazole) .... As Needed 11)  Triamcinolone Acetonide 0.5 % Crea (Triamcinolone Acetonide) .... Use Two Times A Day Prn  Allergies (verified): 1)  ! Codeine 2)  ! * Lamasil  Anticoagulation Management History:      The patient is taking warfarin and comes in today for a routine follow up visit.  Positive risk factors for bleeding include an age of 68 years or older.  The bleeding index is 'intermediate risk'.  Positive CHADS2 values include History of HTN.  Negative CHADS2 values include Age > 68 years old.  The start date was  03/05/2001.  Anticoagulation responsible provider: Jerauld Bostwick MD, Reuel Boom.  INR POC: 2.7.  Cuvette Lot#: [id number].  Exp: 11/2010.    Anticoagulation Management Assessment/Plan:      The patient's current anticoagulation dose is Warfarin sodium 5 mg  tabs: take as diected.  The target INR is 2 - 3.  The next INR is due 12/24/2009.  Anticoagulation instructions were given to patient.  Results were reviewed/authorized by Reina Fuse, PharmD.  He was notified by Reina Fuse PharmD.         Prior Anticoagulation Instructions: INR 2.5 Continue 7.5mg s daily except 5mg s on Tuesdays, Thursdays, and Sundays. Recheck in 4 weeks.   Current Anticoagulation Instructions: INR 2.7  Continue taking Coumadin 1 tab (5 mg) on Sun, Tues, Thur and Coumadin 1.5 tab (7.5 mg) on Mon, Wed, Fri, Sat. Return to clinic in 4 weeks.

## 2010-03-26 NOTE — Medication Information (Signed)
Summary: rov.mp  Anticoagulant Therapy  Managed by: Bethena Midget, RN, BSN Referring MD: Valera Castle MD PCP: Tresa Garter MD Supervising MD: Tenny Craw MD, Gunnar Fusi Indication 1: Aortic Valve Replacement (ICD-V43.3) Indication 2: Aortic Arch Aneurysm (ICD-Aortic) Lab Used: [facility] Site: [facility] INR POC 3.5 INR RANGE 2 - 3  Dietary changes: no    Health status changes: no    Bleeding/hemorrhagic complications: no    Recent/future hospitalizations: no    Any changes in medication regimen? yes       Details: Took Flaygl 500mg s BID for 5 days, finished regimen on Thursday,.  Recent/future dental: no  Any missed doses?: no       Is patient compliant with meds? yes       Allergies: 1)  ! Codeine 2)  ! * Lamasil  Anticoagulation Management History:      The patient is taking warfarin and comes in today for a routine follow up visit.  Positive risk factors for bleeding include an age of 70 years or older.  The bleeding index is 'intermediate risk'.  Positive CHADS2 values include History of HTN.  Negative CHADS2 values include Age > 67 years old.  The start date was 03/05/2001.  Anticoagulation responsible provider: Tenny Craw MD, Gunnar Fusi.  INR POC: 3.5.  Cuvette Lot#: [id number].  Exp: 06/2010.    Anticoagulation Management Assessment/Plan:      The patient's current anticoagulation dose is Warfarin sodium 5 mg  tabs: take as diected.  The target INR is 2 - 3.  The next INR is due 06/11/2009.  Anticoagulation instructions were given to patient.  Results were reviewed/authorized by Bethena Midget, RN, BSN.  He was notified by Bethena Midget, RN, BSN.         Prior Anticoagulation Instructions: INR 1.9  Take 2 tabs today, then 1.5 tabs each Monday, Wednesday, Friday, Saturday and 1 tab on all other days.  Recheck in 2 - 3 weeks.    Current Anticoagulation Instructions: INR 3.5 Skip today then resume 7.5mg s daily except 5mg s Tuesdays, Thursdays and Sundays. Recheck in 3 weeks.

## 2010-03-26 NOTE — Assessment & Plan Note (Signed)
Summary: BLISTERS ON TONGUE/ DIDN'T WANT TO WAIT FOR PM WITH AVP/NWS   Vital Signs:  Patient profile:   70 year old male Height:      72 inches (182.88 cm) Weight:      178 pounds (80.91 kg) O2 Sat:      98 % on Room air Temp:     97.0 degrees F (36.11 degrees C) oral Pulse rate:   54 / minute BP sitting:   142 / 72  (left arm) Cuff size:   regular  Vitals Entered By: Orlan Leavens RMA (November 28, 2009 8:37 AM)  O2 Flow:  Room air CC: Blisters on his tongue Is Patient Diabetic? No   Primary Care Provider:  Tresa Garter MD  CC:  Blisters on his tongue.  History of Present Illness: c/o sore area on mouth onset 10 days ago -progressive course with new blisters each day involves lateral edge and tip of tounge - shallow painful ulcerations - denies accidental trauma to tounge (biting) no other ulcers in mouth or throat no odynophagia; no fever - no other skin rash no hx same    Clinical Review Panels:  Immunizations   Last Tetanus Booster:  Tdap (09/18/2009)   Last Flu Vaccine:  Fluvax 3+ (11/28/2009)  Lipid Management   Cholesterol:  153 (09/13/2009)   LDL (bad choesterol):  85 (09/13/2009)   HDL (good cholesterol):  48.30 (09/13/2009)   Triglycerides:  80 (01/12/2006)  CBC   WBC:  7.1 (09/18/2009)   RBC:  4.86 (09/18/2009)   Hgb:  16.0 (09/18/2009)   Hct:  46.2 (09/18/2009)   Platelets:  177.0 (09/18/2009)   MCV  95.2 (09/18/2009)   MCHC  34.6 (09/18/2009)   RDW  13.2 (09/18/2009)   PMN:  72.1 (09/18/2009)   Lymphs:  14.8 (09/18/2009)   Monos:  11.2 (09/18/2009)   Eosinophils:  1.5 (09/18/2009)   Basophil:  0.4 (09/18/2009)  Complete Metabolic Panel   Glucose:  93 (09/18/2009)   Sodium:  140 (09/18/2009)   Potassium:  5.4 (09/18/2009)   Chloride:  104 (09/18/2009)   CO2:  35 (09/18/2009)   BUN:  15 (09/18/2009)   Creatinine:  1.3 (09/18/2009)   Albumin:  3.9 (09/13/2009)   Total Protein:  5.9 (09/13/2009)   Calcium:  10.1 (09/18/2009)  Total Bili:  0.9 (09/13/2009)   Alk Phos:  66 (09/13/2009)   SGPT (ALT):  30 (09/13/2009)   SGOT (AST):  26 (09/13/2009)   Current Medications (verified): 1)  Warfarin Sodium 5 Mg  Tabs (Warfarin Sodium) .... Take As Diected 2)  Tenormin 25 Mg  Tabs (Atenolol) .... Take 1 By Mouth Qd 3)  Lipitor 10 Mg  Tabs (Atorvastatin Calcium) .... Take 1 By Mouth Qd 4)  Imitrex 50 Mg  Tabs (Sumatriptan Succinate) .... Take 1 By Mouth Once Daily As Needed 5)  Avodart 0.5 Mg Caps (Dutasteride) .Marland Kitchen.. 1 By Mouth Qd 6)  Vitamin D3 1000 Unit  Tabs (Cholecalciferol) .Marland Kitchen.. 1 Once Daily 7)  Voltaren 1 %  Gel (Diclofenac Sodium) .... Two Times A Day  To Qid As Needed 8)  Lomotil 2.5-0.025 Mg Tabs (Diphenoxylate-Atropine) .Marland Kitchen.. 1-2 By Mouth Qid As Needed Diarrhea 9)  Triamcinolone Acetonide 0.5 % Crea (Triamcinolone Acetonide) .... Use Two Times A Day Prn  Allergies (verified): 1)  ! Codeine 2)  ! Corky Crafts  Past History:  Past Medical History: COUMADIN THERAPY (ICD-V58.61) ASCENDING AORTIC ANEURYSM (ICD-441.2) AORTIC INSUFFICIENCY (ICD-424.1) HYPERTENSION (ICD-401.9) HYPERLIPIDEMIA (ICD-272.4) LIPOMA NOS (ICD-214.9) NEOPLASM,  SKIN, UNCERTAIN BEHAVIOR (ICD-238.2) RENAL CYST (ICD-593.2) BENIGN PROSTATIC HYPERTROPHY (ICD-600.00) PROSTATE SPECIFIC ANTIGEN, ELEVATED (ICD-790.93) MIGRAINE, CHRONIC (ICD-346.90) GI Dr Russella Dar  colon 2005 Benign prostatic hypertrophy  Dr Earlyne Iba PSA   Bx 2005  Mild Mitral regurgitation Labile Hypertension  Review of Systems  The patient denies fever, weight loss, hoarseness, chest pain, syncope, and headaches.    Physical Exam  General:  alert, well-developed, well-nourished, and cooperative to examination.    Eyes:  vision grossly intact; pupils equal, round and reactive to light.  conjunctiva and lids normal.    Mouth:  teeth and gums in good repair; tounge with shallow base 1-19mm ulcerations along lateral edge and tip - no bleeding; no thrush; mucous membranes  moist without lesions or ulcers. oropharynx clear without exudate or erythema.  Lungs:  Normal respiratory effort, chest expands symmetrically. Lungs are clear to auscultation, no crackles or wheezes. Heart:  Normal rate and regular rhythm. S1 and S2 normal without gallop, murmur, click, rub or other extra sounds. Crisp valve sound   Impression & Recommendations:  Problem # 1:  GLOSSITIS (ICD-529.0)  suspect viral etiology - emperic valtrax - oral topical mouthwash for pain symptoms  f/u 7-10 with PCP if not improved  Orders: Prescription Created Electronically [phone])  Complete Medication List: 1)  Warfarin Sodium 5 Mg Tabs (Warfarin sodium) .... Take as diected 2)  Tenormin 25 Mg Tabs (Atenolol) .... Take 1 by mouth qd 3)  Lipitor 10 Mg Tabs (Atorvastatin calcium) .... Take 1 by mouth qd 4)  Imitrex 50 Mg Tabs (Sumatriptan succinate) .... Take 1 by mouth once daily as needed 5)  Avodart 0.5 Mg Caps (Dutasteride) .Marland Kitchen.. 1 by mouth qd 6)  Vitamin D3 1000 Unit Tabs (Cholecalciferol) .Marland Kitchen.. 1 once daily 7)  Voltaren 1 % Gel (Diclofenac sodium) .... Two times a day  to qid as needed 8)  Lomotil 2.5-0.025 Mg Tabs (Diphenoxylate-atropine) .Marland Kitchen.. 1-2 by mouth qid as needed diarrhea 9)  Triamcinolone Acetonide 0.5 % Crea (Triamcinolone acetonide) .... Use two times a day prn 10)  Valacyclovir Hcl 1 Gm Tabs (Valacyclovir hcl) .Marland Kitchen.. 1 by mouth three times a day x 7 days 11)  First-bxn Mouthwash Susp (Diphenhyd-lidocaine-nystatin) .... 5 cc by mouth three times a day swish, gargle and spit  Other Orders: Flu Vaccine 94yrs + MEDICARE PATIENTS (U0454) Administration Flu vaccine - MCR (U9811)  Patient Instructions: 1)  it was good to see you today. 2)  medications for mouth sores as discussed and listed below - your prescriptions have been electronically submitted to your pharmacy. Please take as directed. Contact our office if you believe you're having problems with the medication(s).  3)  flu shot  today and talk with the registration team about your coverage for shingles vaccination 4)  Get plenty of rest, drink lots of clear liquids, and use Tylenol or Ibuprofen for fever and comfort. Return in 7-10 days if you're not better:sooner if you're feeling worse. Prescriptions: FIRST-BXN MOUTHWASH  SUSP (DIPHENHYD-LIDOCAINE-NYSTATIN) 5 cc by mouth three times a day swish, gargle and spit  #200cc x 0   Entered and Authorized by:   Newt Lukes MD   Signed by:   Newt Lukes MD on 11/28/2009   Method used:   Electronically to        [facility] [phone]* (retail)       [address]       [place], [place]  [address]       Ph:  [phone]       Fax: [phone]   RxID:   [id number] VALACYCLOVIR HCL 1 GM TABS (VALACYCLOVIR HCL) 1 by mouth three times a day x 7 days  #21 x 0   Entered and Authorized by:   Newt Lukes MD   Signed by:   Newt Lukes MD on 11/28/2009   Method used:   Electronically to        [facility] [phone]* (retail)       [address]       [place], [place]  [address]       Ph: [phone]       Fax: [phone]   RxID:   [id number]  Flu Vaccine Consent Questions     Do you have a history of severe allergic reactions to this vaccine? no    Any prior history of allergic reactions to egg and/or gelatin? no    Do you have a sensitivity to the preservative Thimersol? no    Do you have a past history of Guillan-Barre Syndrome? no    Do you currently have an acute febrile illness? no    Have you ever had a severe reaction to latex? no    Vaccine information given and explained to patient? yes    Are you currently pregnant? no    Lot Number:[id number]   Exp Date:08/24/2010   Site Given  Left Deltoid IM       [facility]  [address] [phone]* (retail)       [address]       [place], [place]  [address]       Ph: [phone]       Fax: [phone]   RxID:   [id number]  .lbmedflu

## 2010-03-26 NOTE — Medication Information (Signed)
Summary: rov   js  Anticoagulant Therapy  Managed by: Shelby Dubin, PharmD, BCPS, CPP Referring MD: Valera Castle MD PCP: Tresa Garter MD Supervising MD: Eden Emms MD, Theron Arista Indication 1: Aortic Valve Replacement (ICD-V43.3) Indication 2: Aortic Arch Aneurysm (ICD-Aortic) Lab Used: [facility] Site: [facility] INR POC 2.1 INR RANGE 2 - 3  Dietary changes: no    Health status changes: no    Bleeding/hemorrhagic complications: no    Recent/future hospitalizations: no    Any changes in medication regimen? no    Recent/future dental: no  Any missed doses?: no       Is patient compliant with meds? yes       Allergies (verified): 1)  ! Codeine 2)  ! * Lamasil  Anticoagulation Management History:      The patient is taking warfarin and comes in today for a routine follow up visit.  Positive risk factors for bleeding include an age of 72 years or older.  The bleeding index is 'intermediate risk'.  Positive CHADS2 values include History of HTN.  Negative CHADS2 values include Age > 73 years old.  The start date was 03/05/2001.  Anticoagulation responsible provider: Eden Emms MD, Theron Arista.  INR POC: 2.1.  Cuvette Lot#: [id number].  Exp: 05/2010.    Anticoagulation Management Assessment/Plan:      The patient's current anticoagulation dose is Warfarin sodium 5 mg  tabs: take as diected.  The target INR is 2 - 3.  The next INR is due 04/17/2009.  Anticoagulation instructions were given to patient.  Results were reviewed/authorized by Shelby Dubin, PharmD, BCPS, CPP.  He was notified by Ysidro Evert, Pharm D Candidate.         Prior Anticoagulation Instructions: INR = 2.1  The patient is to continue with the same dose of coumadin.  This dosage includes: 1 tablet every day except 1.5 tablet on Mondays, Wednesdays, and Fridays.    Recheck in 4 weeks.  Current Anticoagulation Instructions: INR: 2.1 Continue with same dosage of 5mg  tablet daily except 7.5mg  on Mondays, Wednesdays and  Fridays Recheck in 4 weeks

## 2010-03-26 NOTE — Medication Information (Signed)
Summary: rov/sl  Anticoagulant Therapy  Managed by: Cloyde Reams, RN, BSN Referring MD: Valera Castle MD PCP: Tresa Garter MD Supervising MD: Tenny Craw MD, Gunnar Fusi Indication 1: Aortic Valve Replacement (ICD-V43.3) Indication 2: Aortic Arch Aneurysm (ICD-Aortic) Lab Used: [facility] Site: [facility] INR POC 2.2 INR RANGE 2 - 3  Dietary changes: no    Health status changes: no    Bleeding/hemorrhagic complications: no    Recent/future hospitalizations: no    Any changes in medication regimen? yes       Details: Completed anti-viral drug, 10 days ago.   Recent/future dental: no  Any missed doses?: no       Is patient compliant with meds? yes       Allergies: 1)  ! Codeine 2)  ! * Lamasil  Anticoagulation Management History:      The patient is taking warfarin and comes in today for a routine follow up visit.  Positive risk factors for bleeding include an age of 60 years or older.  The bleeding index is 'intermediate risk'.  Positive CHADS2 values include History of HTN.  Negative CHADS2 values include Age > 41 years old.  The start date was 03/05/2001.  Anticoagulation responsible provider: Tenny Craw MD, Gunnar Fusi.  INR POC: 2.2.  Cuvette Lot#: [id number].  Exp: 01/2011.    Anticoagulation Management Assessment/Plan:      The patient's current anticoagulation dose is Warfarin sodium 5 mg  tabs: take as diected.  The target INR is 2 - 3.  The next INR is due 01/21/2010.  Anticoagulation instructions were given to patient.  Results were reviewed/authorized by Cloyde Reams, RN, BSN.  He was notified by Cloyde Reams RN.         Prior Anticoagulation Instructions: INR 2.7  Continue taking Coumadin 1 tab (5 mg) on Sun, Tues, Thur and Coumadin 1.5 tab (7.5 mg) on Mon, Wed, Fri, Sat. Return to clinic in 4 weeks.   Current Anticoagulation Instructions: INR 2.2  Continue on same dosage 1.5 tablets daily except 1 tablet on Sundays, Tuesdays, and Thursdays.  Recheck in 4 weeks.

## 2010-03-26 NOTE — Medication Information (Signed)
Summary: rov/sp  Anticoagulant Therapy  Managed by: Bethena Midget, RN, BSN Referring MD: Valera Castle MD PCP: Tresa Garter MD Supervising MD: Jens Som MD, Arlys John Indication 1: Aortic Valve Replacement (ICD-V43.3) Indication 2: Aortic Arch Aneurysm (ICD-Aortic) Lab Used: [facility] Site: [facility] INR POC 2.5 INR RANGE 2 - 3  Dietary changes: no    Health status changes: no    Bleeding/hemorrhagic complications: no    Recent/future hospitalizations: no    Any changes in medication regimen? no    Recent/future dental: no  Any missed doses?: no       Is patient compliant with meds? yes       Allergies: 1)  ! Codeine 2)  ! * Lamasil  Anticoagulation Management History:      The patient is taking warfarin and comes in today for a routine follow up visit.  Positive risk factors for bleeding include an age of 18 years or older.  The bleeding index is 'intermediate risk'.  Positive CHADS2 values include History of HTN.  Negative CHADS2 values include Age > 47 years old.  The start date was 03/05/2001.  Anticoagulation responsible provider: Jens Som MD, Arlys John.  INR POC: 2.5.  Cuvette Lot#: [id number].  Exp: 11/2010.    Anticoagulation Management Assessment/Plan:      The patient's current anticoagulation dose is Warfarin sodium 5 mg  tabs: take as diected.  The target INR is 2 - 3.  The next INR is due 11/26/2009.  Anticoagulation instructions were given to patient.  Results were reviewed/authorized by Bethena Midget, RN, BSN.  He was notified by Bethena Midget, RN, BSN.         Prior Anticoagulation Instructions: INR 2.4  Continue same dose of 1 1/2 tablets every day except 1 tablet on Sunday, Tuesday and Thursday.  Recheck INR in 4 weeks.   Current Anticoagulation Instructions: INR 2.5 Continue 7.5mg s daily except 5mg s on Tuesdays, Thursdays, and Sundays. Recheck in 4 weeks.

## 2010-03-26 NOTE — Assessment & Plan Note (Signed)
Summary: 6 MTH FU $50--STC   Vital Signs:  Patient profile:   70 year old male Weight:      179 pounds Temp:     96.9 degrees F oral Pulse rate:   41 / minute BP sitting:   112 / 64  (left arm)  Vitals Entered By: Tora Perches (March 09, 2009 8:38 AM) CC: f/u Is Patient Diabetic? No   Primary Care Provider:  Tresa Garter MD  CC:  f/u.  History of Present Illness: The patient presents for a follow up of hypertension, hyperlipidemia. C/o wound on R foot.   Preventive Screening-Counseling & Management  Alcohol-Tobacco     Smoking Status: quit  Current Medications (verified): 1)  Warfarin Sodium 5 Mg  Tabs (Warfarin Sodium) .... Take As Diected 2)  Tenormin 25 Mg  Tabs (Atenolol) .... Take 1 By Mouth Qd 3)  Lipitor 10 Mg  Tabs (Atorvastatin Calcium) .... Take 1 By Mouth Qd 4)  Imitrex 50 Mg  Tabs (Sumatriptan Succinate) .... Take 1 By Mouth Once Daily As Needed 5)  Avodart 0.5 Mg Caps (Dutasteride) .Marland Kitchen.. 1 By Mouth Qd 6)  Vitamin D3 1000 Unit  Tabs (Cholecalciferol) .Marland Kitchen.. 1 Once Daily 7)  Voltaren 1 %  Gel (Diclofenac Sodium) .... Two Times A Day  To Qid As Needed 8)  Lomotil 2.5-0.025 Mg Tabs (Diphenoxylate-Atropine) .Marland Kitchen.. 1-2 By Mouth Qid As Needed Diarrhea 9)  Ciprofloxacin Hcl 500 Mg Tabs (Ciprofloxacin Hcl) .... As Needed 10)  Triamcinolone Acetonide 0.1 % Oint (Triamcinolone Acetonide) .... Use 1-2 Times A Day  Allergies: 1)  ! Codeine 2)  ! 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) AORTIC INSUFFICIENCY (ICD-424.1) HYPERTENSION (ICD-401.9) HYPERLIPIDEMIA (ICD-272.4) LIPOMA NOS (ICD-214.9) NEOPLASM, SKIN, UNCERTAIN BEHAVIOR (ICD-238.2) WELL ADULT EXAM (ICD-V70.0) LOW BACK PAIN (ICD-724.2) NECK PAIN (ICD-723.1) RENAL CYST (ICD-593.2) FOOT PAIN (ICD-729.5) BENIGN PROSTATIC HYPERTROPHY (ICD-600.00) HEADACHE (ICD-784.0) ROUTINE GENERAL MEDICAL  EXAM@HEALTH  CARE FACL (ICD-V70.0) PROSTATE SPECIFIC ANTIGEN, ELEVATED (ICD-790.93) MIGRAINE, CHRONIC (ICD-346.90) GI Dr Russella Dar  colon 2005 Benign prostatic hypertrophy  Dr Serena Colonel, Lelon Huh PSA   Bx 2005  Mild Mitral regurgitation Labile Hypertension  Social History: Last updated: 09/05/2008 Occupation: consulting Married Former Smoker..quit 1977  Review of Systems  The patient denies fever, chest pain, dyspnea on exertion, and melena.    Physical Exam  General:  Well-developed,well-nourished,in no acute distress; alert,appropriate and cooperative throughout examination Nose:  External nasal examination shows no deformity or inflammation. Nasal mucosa are pink and moist without lesions or exudates. Mouth:  Oral mucosa and oropharynx without lesions or exudates.  Teeth in good repair. Neck:  No deformities, masses, or tenderness noted. Lungs:  Normal respiratory effort, chest expands symmetrically. Lungs are clear to auscultation, no crackles or wheezes. Heart:  Normal rate and regular rhythm. S1 and S2 normal without gallop, murmur,There is a crisp click present  Abdomen:  Bowel sounds positive,abdomen soft and non-tender without masses, organomegaly or hernias noted. Msk:  No deformity or scoliosis noted of thoracic or lumbar spine.   Extremities:  No clubbing, cyanosis, edema, or deformity noted with normal full range of motion of all joints.   Neurologic:  No cranial nerve deficits noted. Station and gait are normal. Plantar reflexes are down-going bilaterally. DTRs are symmetrical throughout. Sensory, motor and coordinative functions appear intact. Skin:  R lat foot shws a 6x4 mm wound w/a scab Psych:  Cognition and judgment appear intact. Alert and cooperative with normal  attention span and concentration. No apparent delusions, illusions, hallucinations   Impression & Recommendations:  Problem # 1:  WOUND, FOOT (ICD-892.0) R lat 6 mm - chronic Assessment New Debrided and  dressed  Problem # 2:  HYPERTENSION (ICD-401.9) Assessment: Unchanged  His updated medication list for this problem includes:    Tenormin 25 Mg Tabs (Atenolol) .Marland Kitchen... Take 1 by mouth qd  Problem # 3:  HYPERLIPIDEMIA (ICD-272.4) Assessment: Unchanged  His updated medication list for this problem includes:    Lipitor 10 Mg Tabs (Atorvastatin calcium) .Marland Kitchen... Take 1 by mouth once daily on hold now  Problem # 4:  HYPERTENSION (ICD-401.9) Assessment: Unchanged  His updated medication list for this problem includes:    Tenormin 25 Mg Tabs (Atenolol) .Marland Kitchen... Take 1 by mouth qd  Problem # 5:  FOOT PAIN (ICD-729.5)  Problem # 6:  COUMADIN THERAPY (ICD-V58.61)  Complete Medication List: 1)  Warfarin Sodium 5 Mg Tabs (Warfarin sodium) .... Take as diected 2)  Tenormin 25 Mg Tabs (Atenolol) .... Take 1 by mouth qd 3)  Lipitor 10 Mg Tabs (Atorvastatin calcium) .... Take 1 by mouth qd 4)  Imitrex 50 Mg Tabs (Sumatriptan succinate) .... Take 1 by mouth once daily as needed 5)  Avodart 0.5 Mg Caps (Dutasteride) .Marland Kitchen.. 1 by mouth qd 6)  Vitamin D3 1000 Unit Tabs (Cholecalciferol) .Marland Kitchen.. 1 once daily 7)  Voltaren 1 % Gel (Diclofenac sodium) .... Two times a day  to qid as needed 8)  Lomotil 2.5-0.025 Mg Tabs (Diphenoxylate-atropine) .Marland Kitchen.. 1-2 by mouth qid as needed diarrhea 9)  Ciprofloxacin Hcl 500 Mg Tabs (Ciprofloxacin hcl) .... As needed 10)  Triamcinolone Acetonide 0.1 % Oint (Triamcinolone acetonide) .... Use 1-2 times a day 11)  Metronidazole 500 Mg Tabs (Metronidazole) .Marland Kitchen.. 1 by mouth qid  Patient Instructions: 1)  Please schedule a follow-up appointment in 6 months. 2)  Wound care as we discussed Prescriptions: METRONIDAZOLE 500 MG TABS (METRONIDAZOLE) 1 by mouth qid  #40 x 3   Entered and Authorized by:   Tresa Garter MD   Signed by:   Tresa Garter MD on 03/09/2009   Method used:   Electronically to        [facility] [phone]* (retail)       [address]        [place], [place]  [address]       Ph: [phone]       Fax: [phone]   RxID:   [id number] CIPROFLOXACIN HCL 500 MG TABS (CIPROFLOXACIN HCL) as needed  #20 x 2   Entered and Authorized by:   Tresa Garter MD   Signed by:   Tresa Garter MD on 03/09/2009   Method used:   Electronically to        [facility] [phone]* (retail)       [address]       [place], [place]  [address]       Ph: [phone]       Fax: [phone]   RxID:   [id number]
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
## 2010-03-26 NOTE — Progress Notes (Signed)
Summary: Cipro Rf  Phone Note From Pharmacy   Caller: [facility] [employer] [facility]: rec rf req for Cipro 500 take as directed. # 20. Med is not on list, please advise. Initial call taken by: Lanier Prude, [facility]),  January 21, 2010 9:40 AM  Follow-up for Phone Call        ok with 1 ref Follow-up by: Tresa Garter MD,  January 21, 2010 5:43 PM    New/Updated Medications: CIPRO 500 MG TABS (CIPROFLOXACIN HCL) take as directed by MD Prescriptions: CIPRO 500 MG TABS (CIPROFLOXACIN HCL) take as directed by MD  #20 x 1   Entered by:   Lamar Sprinkles, CMA   Authorized by:   Tresa Garter MD   Signed by:   Lamar Sprinkles, CMA on 01/21/2010   Method used:   Electronically to        [facility] [phone]* (retail)       [address]       [place], [place]  [address]       Ph: [phone]       Fax: [phone]   RxID:   [id number]

## 2010-03-26 NOTE — Medication Information (Signed)
Summary: rov/ewj  Anticoagulant Therapy  Managed by: Weston Brass, PharmD Referring MD: Valera Castle MD PCP: Tresa Garter MD Supervising MD: Gala Romney MD, Reuel Boom Indication 1: Aortic Valve Replacement (ICD-V43.3) Indication 2: Aortic Arch Aneurysm (ICD-Aortic) Lab Used: [facility] Site: [facility] INR POC 2.5 INR RANGE 2 - 3  Dietary changes: no    Health status changes: no    Bleeding/hemorrhagic complications: no    Recent/future hospitalizations: no    Any changes in medication regimen? yes       Details: on cipro a few weeks ago  Recent/future dental: no  Any missed doses?: no       Is patient compliant with meds? yes       Allergies: 1)  ! Codeine 2)  ! * Lamasil  Anticoagulation Management History:      The patient is taking warfarin and comes in today for a routine follow up visit.  Positive risk factors for bleeding include an age of 65 years or older.  The bleeding index is 'intermediate risk'.  Positive CHADS2 values include History of HTN.  Negative CHADS2 values include Age > 42 years old.  The start date was 03/05/2001.  Anticoagulation responsible provider: Bensimhon MD, Reuel Boom.  INR POC: 2.5.  Cuvette Lot#: [id number].  Exp: 09/2010.    Anticoagulation Management Assessment/Plan:      The patient's current anticoagulation dose is Warfarin sodium 5 mg  tabs: take as diected.  The target INR is 2 - 3.  The next INR is due 08/02/2009.  Anticoagulation instructions were given to patient.  Results were reviewed/authorized by Weston Brass, PharmD.  He was notified by Weston Brass PharmD.         Prior Anticoagulation Instructions: INR 2.0  Continue on same dosage 7.5mg  daily except 5mg  on Tuesdays, Thursdays, and Sundays.  Recheck in 4 weeks.    Current Anticoagulation Instructions: INR 2.5  Continue same dose of 1 1/2 tablets every day except 1 tablet on Sunday, Tuesday and Thursday.

## 2010-03-26 NOTE — Assessment & Plan Note (Signed)
Summary: F6M/ANAS  Medications Added CIPROFLOXACIN HCL 500 MG TABS (CIPROFLOXACIN HCL) as needed        Visit Type:  6 mo f/u Primary Provider:  Tresa Garter MD  CC:  sob..denies any other complaints today.  History of Present Illness: Patrick [name] returns today for followup evaluation of his history of aortic aneurysm repair, aortic valve replacement, anticoagulation and history of bradycardia.  His biggest complaint is muscle aches and pain. He also has a lot of fatigue. His concern that maybe the Lipitor.  He denies any chest pain, shortness of breath, tachycardia palpitations, syncope, or dyspnea on exertion. Laboratory data is stable.  His last echocardiogram was a year ago which showed moderate aortic root dilatation, mild aortic insufficiency which is slightly worse, but normal left ventricular chamber size and overall systolic function. There was no evidence of pulmonary hypertension.  Current Medications (verified): 1)  Warfarin Sodium 5 Mg  Tabs (Warfarin Sodium) .... Take As Diected 2)  Tenormin 25 Mg  Tabs (Atenolol) .... Take 1 By Mouth Qd 3)  Lipitor 10 Mg  Tabs (Atorvastatin Calcium) .... Take 1 By Mouth Qd 4)  Imitrex 50 Mg  Tabs (Sumatriptan Succinate) .... Take 1 By Mouth Once Daily As Needed 5)  Avodart 0.5 Mg Caps (Dutasteride) .Marland Kitchen.. 1 By Mouth Qd 6)  Vitamin D3 1000 Unit  Tabs (Cholecalciferol) .Marland Kitchen.. 1 Once Daily 7)  Voltaren 1 %  Gel (Diclofenac Sodium) .... Two Times A Day  To Qid As Needed 8)  Lomotil 2.5-0.025 Mg Tabs (Diphenoxylate-Atropine) .Marland Kitchen.. 1-2 By Mouth Qid As Needed Diarrhea 9)  Ciprofloxacin Hcl 500 Mg Tabs (Ciprofloxacin Hcl) .... As Needed 10)  Triamcinolone Acetonide 0.1 % Oint (Triamcinolone Acetonide) .... Use 1-2 Times A Day  Allergies: 1)  ! Codeine 2)  ! * Lamasil  Review of Systems       other than muscle aches and fatigue, and nothing other than history of present illness  Vital Signs:  Patient profile:   70 year old  male Height:      72 inches Weight:      180 pounds BMI:     24.50 Pulse rate:   58 / minute Pulse rhythm:   regular BP sitting:   138 / 80  (left arm) Cuff size:   large  Vitals Entered By: Danielle Rankin, CMA (February 27, 2009 2:48 PM)  Physical Exam  General:  Well developed, well nourished, in no acute distress. Head:  normocephalic and atraumatic Eyes:  PERRLA/EOM intact; conjunctiva and lids normal. Mouth:  Teeth, gums and palate normal. Oral mucosa normal. Neck:  Neck supple, no JVD. No masses, thyromegaly or abnormal cervical nodes. Chest Keiran Sias:  no deformities or breast masses noted Heart:  regular rate and rhythm, prosthetic S2 splits, no significant aortic insufficiency heard, PMI nondisplaced Abdomen:  no bruits or pulsatile mass Msk:  decreased ROM.  decreased ROM.   Pulses:  pulses normal in all 4 extremities Extremities:  No clubbing or cyanosis. Neurologic:  Alert and oriented x 3. Skin:  Intact without lesions or rashes. Psych:  Normal affect.   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 We will follow 2-D echocardiogram to assess his aortic root which is moderately dilated from his graft and his aortic valve. He had mild aortic insufficiency a year ago which was slightly worse. I suspect this is stable. No change in treatment. Orders: Echocardiogram (Echo)  Problem # 2:  AORTIC INSUFFICIENCY (ICD-424.1)  His updated medication list for this problem includes:    Tenormin 25 Mg Tabs (Atenolol) .Marland Kitchen... Take 1 by mouth qd  His updated medication list for this problem includes:    Tenormin 25 Mg Tabs (Atenolol) .Marland Kitchen... Take 1 by mouth qd  Problem # 3:  HYPERLIPIDEMIA (ICD-272.4) I have asked him to stop his Lipitor for 4 weeks. If his muscle aches get remarkably better, we'll try Crestor 5 mg per day. His updated medication list for this problem includes:    Lipitor 10 Mg Tabs  (Atorvastatin calcium) .Marland Kitchen... Take 1 by mouth qd  His updated medication list for this problem includes:    Lipitor 10 Mg Tabs (Atorvastatin calcium) .Marland Kitchen... Take 1 by mouth qd  Patient Instructions: 1)  Your physician recommends that you schedule a follow-up appointment in: 6 months 2)  Your physician has recommended you make the following change in your medication: please discontinue lipitor for 1 month 3)  Your physician has requested that you have an echocardiogram.  Echocardiography is a painless test that uses sound waves to create images of your heart. It provides your doctor with information about the size and shape of your heart and how well your heart's chambers and valves are working.  This procedure takes approximately one hour. There are no restrictions for this procedure.

## 2010-03-26 NOTE — Letter (Signed)
[facility]   Imported By: Sherian Rein 07/12/2009 09:51:07  _____________________________________________________________________  External Attachment:    Type:   Image     Comment:   External Document

## 2010-03-26 NOTE — Medication Information (Signed)
Summary: rov/tm  Anticoagulant Therapy  Managed by: Weston Brass, PharmD Referring MD: Valera Castle MD PCP: Georgina Quint [name] MD Supervising MD: Tenny Craw MD, Gunnar Fusi Indication 1: Aortic Valve Replacement (ICD-V43.3) Indication 2: Aortic Arch Aneurysm (ICD-Aortic) Lab Used: [facility] Site: [facility] INR POC 2.4 INR RANGE 2 - 3  Dietary changes: no    Health status changes: no    Bleeding/hemorrhagic complications: no    Recent/future hospitalizations: no    Any changes in medication regimen? no    Recent/future dental: no  Any missed doses?: no       Is patient compliant with meds? yes       Allergies: 1)  ! Codeine 2)  ! * Lamasil  Anticoagulation Management History:      The patient is taking warfarin and comes in today for a routine follow up visit.  Positive risk factors for bleeding include an age of 68 years or older.  The bleeding index is 'intermediate risk'.  Positive CHADS2 values include History of HTN.  Negative CHADS2 values include Age > 68 years old.  The start date was 03/05/2001.  Anticoagulation responsible Harrison Zetina: Tenny Craw MD, Gunnar Fusi.  INR POC: 2.4.  Cuvette Lot#: [id number].  Exp: 11/2010.    Anticoagulation Management Assessment/Plan:      The patient's current anticoagulation dose is Warfarin sodium 5 mg  tabs: take as diected.  The target INR is 2 - 3.  The next INR is due 10/25/2009.  Anticoagulation instructions were given to patient.  Results were reviewed/authorized by Weston Brass, PharmD.  He was notified by Weston Brass PharmD.         Prior Anticoagulation Instructions: INR 2.4 Continue 7.5mg s daily except 5mg s on Tuesdays, Thursdays and Sundays. Recheck in 4 weeks.   Current Anticoagulation Instructions: INR 2.4  Continue same dose of 1 1/2 tablets every day except 1 tablet on Sunday, Tuesday and Thursday.  Recheck INR in 4 weeks.

## 2010-03-26 NOTE — Medication Information (Signed)
Summary: rov/ez  Anticoagulant Therapy  Managed by: Bethena Midget, RN, BSN Referring MD: Valera Castle MD PCP: Tresa Garter MD Supervising MD: Jens Som MD, Arlys John Indication 1: Aortic Valve Replacement (ICD-V43.3) Indication 2: Aortic Arch Aneurysm (ICD-Aortic) Lab Used: [facility] Site: [facility] INR POC 1.5 INR RANGE 2 - 3  Dietary changes: no    Health status changes: no    Bleeding/hemorrhagic complications: no    Recent/future hospitalizations: no    Any changes in medication regimen? no    Recent/future dental: no  Any missed doses?: no       Is patient compliant with meds? yes       Allergies: 1)  ! Codeine 2)  ! * Lamasil  Anticoagulation Management History:      The patient is taking warfarin and comes in today for a routine follow up visit.  Positive risk factors for bleeding include an age of 68 years or older.  The bleeding index is 'intermediate risk'.  Positive CHADS2 values include History of HTN.  Negative CHADS2 values include Age > 68 years old.  The start date was 03/05/2001.  Anticoagulation responsible provider: Jens Som MD, Arlys John.  INR POC: 1.5.  Cuvette Lot#: [id number].  Exp: 05/2010.    Anticoagulation Management Assessment/Plan:      The patient's current anticoagulation dose is Warfarin sodium 5 mg  tabs: take as diected.  The target INR is 2 - 3.  The next INR is due 05/01/2009.  Anticoagulation instructions were given to patient.  Results were reviewed/authorized by Bethena Midget, RN, BSN.  He was notified by Bethena Midget, RN, BSN.         Prior Anticoagulation Instructions: INR: 2.1 Continue with same dosage of 5mg  tablet daily except 7.5mg  on Mondays, Wednesdays and Fridays Recheck in 4 weeks   Current Anticoagulation Instructions: INR 1.5 Today 10mg s then resume 5mg s daily except 7.5mg s on Mondays, Wednesdays, and Fridays Recheck in 2 weeks.

## 2010-03-26 NOTE — Letter (Signed)
Summary: Handout Printed  Printed Handout:  - Coumadin Instructions-w/out Meds 

## 2010-03-26 NOTE — Medication Information (Signed)
Summary: rov/tm  Anticoagulant Therapy  Managed by: Bethena Midget, RN, BSN Referring MD: Valera Castle MD PCP: Tresa Garter MD Supervising MD: Jens Som MD, Arlys John Indication 1: Aortic Valve Replacement (ICD-V43.3) Indication 2: Aortic Arch Aneurysm (ICD-Aortic) Lab Used: [facility] Site: [facility] INR POC 1.9 INR RANGE 2 - 3  Dietary changes: no    Health status changes: no    Bleeding/hemorrhagic complications: no    Recent/future hospitalizations: no    Any changes in medication regimen? no    Recent/future dental: no  Any missed doses?: no       Is patient compliant with meds? yes       Current Medications (verified): 1)  Warfarin Sodium 5 Mg  Tabs (Warfarin Sodium) .... Take As Diected 2)  Tenormin 25 Mg  Tabs (Atenolol) .... Take 1 By Mouth Qd 3)  Lipitor 10 Mg  Tabs (Atorvastatin Calcium) .... Take 1 By Mouth Qd 4)  Imitrex 50 Mg  Tabs (Sumatriptan Succinate) .... Take 1 By Mouth Once Daily As Needed 5)  Avodart 0.5 Mg Caps (Dutasteride) .Marland Kitchen.. 1 By Mouth Qd 6)  Vitamin D3 1000 Unit  Tabs (Cholecalciferol) .Marland Kitchen.. 1 Once Daily 7)  Voltaren 1 %  Gel (Diclofenac Sodium) .... Two Times A Day  To Qid As Needed 8)  Lomotil 2.5-0.025 Mg Tabs (Diphenoxylate-Atropine) .Marland Kitchen.. 1-2 By Mouth Qid As Needed Diarrhea 9)  Ciprofloxacin Hcl 500 Mg Tabs (Ciprofloxacin Hcl) .... As Needed 10)  Triamcinolone Acetonide 0.1 % Oint (Triamcinolone Acetonide) .... Use 1-2 Times A Day 11)  Metronidazole 500 Mg Tabs (Metronidazole) .Marland Kitchen.. 1 By Mouth Qid  Allergies (verified): 1)  ! Codeine 2)  ! * Lamasil  Anticoagulation Management History:      The patient is taking warfarin and comes in today for a routine follow up visit.  Positive risk factors for bleeding include an age of 65 years or older.  The bleeding index is 'intermediate risk'.  Positive CHADS2 values include History of HTN.  Negative CHADS2 values include Age > 70 years old.  The start date was 03/05/2001.  Anticoagulation  responsible provider: Jens Som MD, Arlys John.  INR POC: 1.9.  Cuvette Lot#: [id number].  Exp: 06/2010.    Anticoagulation Management Assessment/Plan:      The patient's current anticoagulation dose is Warfarin sodium 5 mg  tabs: take as diected.  The target INR is 2 - 3.  The next INR is due 05/22/2009.  Anticoagulation instructions were given to patient.  Results were reviewed/authorized by Bethena Midget, RN, BSN.  He was notified by Shelby Dubin PharmD, BCPS, CPP.         Prior Anticoagulation Instructions: INR 1.5 Today 10mg s then resume 5mg s daily except 7.5mg s on Mondays, Wednesdays, and Fridays Recheck in 2 weeks.   Current Anticoagulation Instructions: INR 1.9  Take 2 tabs today, then 1.5 tabs each Monday, Wednesday, Friday, Saturday and 1 tab on all other days.  Recheck in 2 - 3 weeks.

## 2010-03-26 NOTE — Medication Information (Signed)
Summary: rov/ewj   Anticoagulant Therapy  Managed by: Weston Brass, PharmD Referring MD: Valera Castle MD PCP: Georgina Quint [name] MD Supervising MD: Tenny Craw MD, Gunnar Fusi Indication 1: Aortic Valve Replacement (ICD-V43.3) Indication 2: Aortic Arch Aneurysm (ICD-Aortic) Lab Used: [facility] Site: [facility] INR POC 3.2 INR RANGE 2 - 3  Dietary changes: no    Health status changes: no    Bleeding/hemorrhagic complications: no    Recent/future hospitalizations: no    Any changes in medication regimen? yes       Details: Took ciprofloxacin, about 2 weeks ago was last dose  Recent/future dental: no  Any missed doses?: no       Is patient compliant with meds? yes       Allergies: 1)  ! Codeine 2)  ! * Lamasil  Anticoagulation Management History:      The patient is taking warfarin and comes in today for a routine follow up visit.  Positive risk factors for bleeding include an age of 65 years or older.  The bleeding index is 'intermediate risk'.  Positive CHADS2 values include History of HTN.  Negative CHADS2 values include Age > 92 years old.  The start date was 03/05/2001.  Anticoagulation responsible provider: Tenny Craw MD, Gunnar Fusi.  INR POC: 3.2.  Cuvette Lot#: [id number].  Exp: 01/2011.    Anticoagulation Management Assessment/Plan:      The patient's current anticoagulation dose is Warfarin sodium 5 mg  tabs: take as diected.  The target INR is 2 - 3.  The next INR is due 02/19/2010.  Anticoagulation instructions were given to patient.  Results were reviewed/authorized by Weston Brass, PharmD.  He was notified by Hoy Register, PharmD [place].         Prior Anticoagulation Instructions: INR 2.2  Continue on same dosage 1.5 tablets daily except 1 tablet on Sundays, Tuesdays, and Thursdays.  Recheck in 4 weeks.    Current Anticoagulation Instructions: INR 3.2 Take 1 today, then resume dose of 7.5 mg everyday except 5 mg on Sunday, Tuesday, and Thursday Recheck INR in 4 weeks

## 2010-03-26 NOTE — Medication Information (Signed)
Summary: rov/sp  Anticoagulant Therapy  Managed by: Weston Brass, PharmD Referring MD: Valera Castle MD PCP: Tresa Garter MD Supervising MD: Eden Emms MD, Theron Arista Indication 1: Aortic Valve Replacement (ICD-V43.3) Indication 2: Aortic Arch Aneurysm (ICD-Aortic) Lab Used: [facility] Site: [facility] INR POC 2.2 INR RANGE 2 - 3  Dietary changes: no    Health status changes: no    Bleeding/hemorrhagic complications: yes       Details: small nosebleeds; suggested normal saline nasal spray  Recent/future hospitalizations: no    Any changes in medication regimen? no    Recent/future dental: no  Any missed doses?: no       Is patient compliant with meds? yes       Allergies: 1)  ! Codeine 2)  ! * Lamasil  Anticoagulation Management History:      The patient is taking warfarin and comes in today for a routine follow up visit.  Positive risk factors for bleeding include an age of 68 years or older.  The bleeding index is 'intermediate risk'.  Positive CHADS2 values include History of HTN.  Negative CHADS2 values include Age > 79 years old.  The start date was 03/05/2001.  Anticoagulation responsible provider: Eden Emms MD, Theron Arista.  INR POC: 2.2.  Cuvette Lot#: [id number].  Exp: 09/2010.    Anticoagulation Management Assessment/Plan:      The patient's current anticoagulation dose is Warfarin sodium 5 mg  tabs: take as diected.  The target INR is 2 - 3.  The next INR is due 08/30/2009.  Anticoagulation instructions were given to patient.  Results were reviewed/authorized by Weston Brass, PharmD.  He was notified by Weston Brass PharmD.         Prior Anticoagulation Instructions: INR 2.5  Continue same dose of 1 1/2 tablets every day except 1 tablet on Sunday, Tuesday and Thursday.   Current Anticoagulation Instructions: INR 2.1  Continue same dose of 7.5mg  daily except 5mg  on Sunday, Tuesday and Thursday

## 2010-03-26 NOTE — Medication Information (Signed)
Summary: rov/sp  Anticoagulant Therapy  Managed by: Bethena Midget, RN, BSN Referring MD: Valera Castle MD PCP: Tresa Garter MD Supervising MD: Tenny Craw MD, Gunnar Fusi Indication 1: Aortic Valve Replacement (ICD-V43.3) Indication 2: Aortic Arch Aneurysm (ICD-Aortic) Lab Used: [facility] Site: [facility] INR POC 2.4 INR RANGE 2 - 3  Dietary changes: no    Health status changes: no    Bleeding/hemorrhagic complications: no    Recent/future hospitalizations: no    Any changes in medication regimen? no    Recent/future dental: no  Any missed doses?: no       Is patient compliant with meds? yes       Allergies: 1)  ! Codeine 2)  ! * Lamasil  Anticoagulation Management History:      The patient is taking warfarin and comes in today for a routine follow up visit.  Positive risk factors for bleeding include an age of 68 years or older.  The bleeding index is 'intermediate risk'.  Positive CHADS2 values include History of HTN.  Negative CHADS2 values include Age > 47 years old.  The start date was 03/05/2001.  Anticoagulation responsible provider: Tenny Craw MD, Gunnar Fusi.  INR POC: 2.4.  Cuvette Lot#: [id number].  Exp: 10/2010.    Anticoagulation Management Assessment/Plan:      The patient's current anticoagulation dose is Warfarin sodium 5 mg  tabs: take as diected.  The target INR is 2 - 3.  The next INR is due 09/27/2009.  Anticoagulation instructions were given to patient.  Results were reviewed/authorized by Bethena Midget, RN, BSN.  He was notified by Bethena Midget, RN, BSN.         Prior Anticoagulation Instructions: INR 2.1  Continue same dose of 7.5mg  daily except 5mg  on Sunday, Tuesday and Thursday   Current Anticoagulation Instructions: INR 2.4 Continue 7.5mg s daily except 5mg s on Tuesdays, Thursdays and Sundays. Recheck in 4 weeks.

## 2010-03-26 NOTE — Medication Information (Signed)
Summary: rov./tm  Anticoagulant Therapy  Managed by: Cloyde Reams, RN, BSN Referring MD: Valera Castle MD PCP: Tresa Garter MD Supervising MD: Shirlee Latch MD, Hashem Goynes Indication 1: Aortic Valve Replacement (ICD-V43.3) Indication 2: Aortic Arch Aneurysm (ICD-Aortic) Lab Used: [facility] Site: [facility] INR POC 2.0 INR RANGE 2 - 3  Dietary changes: no    Health status changes: no    Bleeding/hemorrhagic complications: no    Recent/future hospitalizations: no    Any changes in medication regimen? no    Recent/future dental: no  Any missed doses?: no       Is patient compliant with meds? yes       Allergies: 1)  ! Codeine 2)  ! * Lamasil  Anticoagulation Management History:      The patient is taking warfarin and comes in today for a routine follow up visit.  Positive risk factors for bleeding include an age of 23 years or older.  The bleeding index is 'intermediate risk'.  Positive CHADS2 values include History of HTN.  Negative CHADS2 values include Age > 101 years old.  The start date was 03/05/2001.  Anticoagulation responsible provider: Shirlee Latch MD, Harland Aguiniga.  INR POC: 2.0.  Cuvette Lot#: [id number].  Exp: 06/2010.    Anticoagulation Management Assessment/Plan:      The patient's current anticoagulation dose is Warfarin sodium 5 mg  tabs: take as diected.  The target INR is 2 - 3.  The next INR is due 07/05/2009.  Anticoagulation instructions were given to patient.  Results were reviewed/authorized by Cloyde Reams, RN, BSN.  He was notified by Cloyde Reams RN.         Prior Anticoagulation Instructions: INR 3.5 Skip today then resume 7.5mg s daily except 5mg s Tuesdays, Thursdays and Sundays. Recheck in 3 weeks.   Current Anticoagulation Instructions: INR 2.0  Continue on same dosage 7.5mg  daily except 5mg  on Tuesdays, Thursdays, and Sundays.  Recheck in 4 weeks.
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
## 2010-03-28 NOTE — Medication Information (Signed)
Summary: rov/nb      Allergies Added:  Anticoagulant Therapy  Managed by: Samantha Crimes, PharmD Referring MD: Valera Castle MD PCP: Tresa Garter MD Supervising MD: Myrtis Ser MD, Tinnie Gens Indication 1: Aortic Valve Replacement (ICD-V43.3) Indication 2: Aortic Arch Aneurysm (ICD-Aortic) Lab Used: [facility] Site: [facility] INR POC 2.8 INR RANGE 2 - 3  Dietary changes: no    Health status changes: no    Bleeding/hemorrhagic complications: no    Recent/future hospitalizations: no    Any changes in medication regimen? no    Recent/future dental: no  Any missed doses?: no       Is patient compliant with meds? yes       Current Medications (verified): 1)  Warfarin Sodium 5 Mg  Tabs (Warfarin Sodium) .... Take As Diected 2)  Tenormin 25 Mg  Tabs (Atenolol) .... Take 1 By Mouth Qd 3)  Lipitor 10 Mg  Tabs (Atorvastatin Calcium) .... Take 1 By Mouth Qd 4)  Imitrex 50 Mg  Tabs (Sumatriptan Succinate) .... Take 1 By Mouth Once Daily As Needed 5)  Avodart 0.5 Mg Caps (Dutasteride) .Marland Kitchen.. 1 By Mouth Qd 6)  Vitamin D3 1000 Unit  Tabs (Cholecalciferol) .Marland Kitchen.. 1 Once Daily 7)  Voltaren 1 %  Gel (Diclofenac Sodium) .... Two Times A Day  To Qid As Needed 8)  Lomotil 2.5-0.025 Mg Tabs (Diphenoxylate-Atropine) .Marland Kitchen.. 1-2 By Mouth Qid As Needed Diarrhea 9)  Triamcinolone Acetonide 0.5 % Crea (Triamcinolone Acetonide) .... Use Two Times A Day Prn 10)  First-Bxn Mouthwash  Susp (Diphenhyd-Lidocaine-Nystatin) .... 5 Cc By Mouth Three Times A Day Swish, Gargle and Spit 11)  Cipro 500 Mg Tabs (Ciprofloxacin Hcl) .... Take As Directed By Md  Allergies (verified): 1)  ! Codeine 2)  ! Corky Crafts  Anticoagulation Management History:      Positive risk factors for bleeding include an age of 3 years or older.  The bleeding index is 'intermediate risk'.  Positive CHADS2 values include History of HTN.  Negative CHADS2 values include Age > 76 years old.  The start date was 03/05/2001.  Anticoagulation  responsible provider: Myrtis Ser MD, Tinnie Gens.  INR POC: 2.8.  Exp: 01/2011.    Anticoagulation Management Assessment/Plan:      The patient's current anticoagulation dose is Warfarin sodium 5 mg  tabs: take as diected.  The target INR is 2 - 3.  The next INR is due 03/19/2010.  Anticoagulation instructions were given to patient.  Results were reviewed/authorized by Samantha Crimes, PharmD.         Prior Anticoagulation Instructions: INR 3.2 Take 1 today, then resume dose of 7.5 mg everyday except 5 mg on Sunday, Tuesday, and Thursday Recheck INR in 4 weeks   Current Anticoagulation Instructions: Cont with current regimen Return to clinic on Jan 23rd, 0900 am

## 2010-03-28 NOTE — Assessment & Plan Note (Signed)
Summary: 6 MTH FU--STC   Vital Signs:  Patient profile:   70 year old male Height:      72 inches Weight:      180 pounds BMI:     24.50 Temp:     97.9 degrees F oral Pulse rate:   72 / minute Pulse rhythm:   regular Resp:     16 per minute BP sitting:   120 / 80  (left arm) Cuff size:   regular  Vitals Entered By: Lanier Prude, CMA(AAMA) (March 19, 2010 10:51 AM) CC: 6 mo f/u  Is Patient Diabetic? No   Primary Care Provider:  Tresa Garter MD  CC:  6 mo f/u .  History of Present Illness: The patient presents for a follow up of hypertension, diabetes  Current Medications (verified): 1)  Warfarin Sodium 5 Mg  Tabs (Warfarin Sodium) .... Take As Diected 2)  Tenormin 25 Mg  Tabs (Atenolol) .... Take 1 By Mouth Qd 3)  Lipitor 10 Mg  Tabs (Atorvastatin Calcium) .... Take 1 By Mouth Qd 4)  Imitrex 50 Mg  Tabs (Sumatriptan Succinate) .... Take 1 By Mouth Once Daily As Needed 5)  Avodart 0.5 Mg Caps (Dutasteride) .Marland Kitchen.. 1 By Mouth Qd 6)  Vitamin D3 1000 Unit  Tabs (Cholecalciferol) .Marland Kitchen.. 1 Once Daily 7)  Voltaren 1 %  Gel (Diclofenac Sodium) .... Two Times A Day  To Qid As Needed 8)  Lomotil 2.5-0.025 Mg Tabs (Diphenoxylate-Atropine) .Marland Kitchen.. 1-2 By Mouth Qid As Needed Diarrhea 9)  Triamcinolone Acetonide 0.5 % Crea (Triamcinolone Acetonide) .... Use Two Times A Day Prn 10)  First-Bxn Mouthwash  Susp (Diphenhyd-Lidocaine-Nystatin) .... 5 Cc By Mouth Three Times A Day Swish, Gargle and Spit 11)  Cipro 500 Mg Tabs (Ciprofloxacin Hcl) .... Take As Directed By Md  Allergies (verified): 1)  ! Codeine 2)  ! 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) AORTIC INSUFFICIENCY (ICD-424.1) HYPERTENSION (ICD-401.9) HYPERLIPIDEMIA (ICD-272.4) LIPOMA NOS (ICD-214.9) NEOPLASM, SKIN, UNCERTAIN BEHAVIOR (ICD-238.2) RENAL CYST (ICD-593.2) BENIGN PROSTATIC HYPERTROPHY (ICD-600.00) PROSTATE  SPECIFIC ANTIGEN, ELEVATED (ICD-790.93) MIGRAINE, CHRONIC (ICD-346.90) GI Dr Russella Dar  colon 2005 Benign prostatic hypertrophy  Dr Serena Colonel, Lelon Huh PSA   Bx 2005  Mild Mitral regurgitation Labile Hypertension  Social History: Last updated: 09/05/2008 Occupation: consulting Married Former Smoker..quit 1977  Review of Systems  The patient denies fever, weight loss, dyspnea on exertion, and abdominal pain.         face and neck skin irritation post Rx  Physical Exam  General:  alert, well-developed, well-nourished, and cooperative to examination.    Nose:  External nasal examination shows no deformity or inflammation. Nasal mucosa are pink and moist without lesions or exudates. Mouth:  teeth and gums in good repair; tounge with shallow base 1-32mm ulcerations along lateral edge and tip - no bleeding; no thrush; mucous membranes moist without lesions or ulcers. oropharynx clear without exudate or erythema.  Lungs:  Normal respiratory effort, chest expands symmetrically. Lungs are clear to auscultation, no crackles or wheezes. Heart:  Normal rate and regular rhythm. S1 and S2 normal without gallop, murmur, rub  Crisp valve sound Abdomen:  Bowel sounds positive,abdomen soft and non-tender without masses, organomegaly or hernias noted. Msk:  No deformity or scoliosis noted of thoracic or lumbar spine.   Neurologic:  No cranial nerve deficits noted. Station and gait are normal. Plantar reflexes are down-going bilaterally. DTRs are symmetrical throughout. Sensory, motor and  coordinative functions appear intact. Skin:  erythematous confluent rash on face and neck Psych:  Cognition and judgment appear intact. Alert and cooperative with normal attention span and concentration. No apparent delusions, illusions, hallucinations   Impression & Recommendations:  Problem # 1:  COUMADIN THERAPY (ICD-V58.61) Assessment Unchanged On the regimen of medicine(s) reflected in the chart    Problem # 2:   BRADYCARDIA (ICD-427.89) Assessment: Unchanged  His updated medication list for this problem includes:    Warfarin Sodium 5 Mg Tabs (Warfarin sodium) .Marland Kitchen... Take as diected    Tenormin 25 Mg Tabs (Atenolol) .Marland Kitchen... Take 1 by mouth qd  Echocardiogram:  Study Conclusions    - Left ventricle: The cavity size was normal. Wall thickness was     normal. Systolic function was normal. The estimated ejection     fraction was in the range of 55% to 60%.   - Aortic valve: Normal appearing tissue valve with trivial     periprosthetic AR   - Mitral valve: Mild regurgitation.   - Left atrium: The atrium was mildly dilated.   - Atrial septum: No defect or patent foramen ovale was identified.   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 Consider F/u CT or MRI     to make sure aortic root stable.   Impressions:    - Aortic root has the impression of a graft wrapped in native Ao.     Consider F/u CT or MRI     to make sure aortic root stable.   Transthoracic echocardiography. M-mode, complete 2D, spectral   Doppler, and color Doppler. Height: Height: 195.6cm. Height: 77in.   Weight: Weight: 81.8kg. Weight: 180lb. Body mass index: BMI:   21.4kg/m^2. Body surface area: BSA: 2.74m^2. Blood pressure: 124/72.   Patient status: Outpatient. Location: [facility]  (03/20/2009)  Labs Reviewed: Na: 140 (09/18/2009)   K+: 5.4 (09/18/2009)   CL: 104 (09/18/2009)   HCO3: 35 (09/18/2009) Ca: 10.1 (09/18/2009)   TSH: 0.63 (09/18/2009)   HCO3: 35 (09/18/2009)  Problem # 3:  SKIN RASH (ICD-782.1) post Rx 5FU cream Assessment: New  Problem # 4:  Travel Assessment: Unchanged Cipro as needed Rx refilled  Complete Medication List: 1)  Warfarin Sodium 5 Mg Tabs (Warfarin sodium) .... Take as diected 2)  Tenormin 25 Mg Tabs (Atenolol) .... Take 1 by mouth qd 3)  Lipitor 10 Mg Tabs (Atorvastatin calcium) .... Take 1 by mouth qd 4)  Imitrex 50 Mg Tabs (Sumatriptan  succinate) .... Take 1 by mouth once daily as needed 5)  Avodart 0.5 Mg Caps (Dutasteride) .Marland Kitchen.. 1 by mouth qd 6)  Vitamin D3 1000 Unit Tabs (Cholecalciferol) .Marland Kitchen.. 1 once daily 7)  Voltaren 1 % Gel (Diclofenac sodium) .... Two times a day  to qid as needed 8)  Lomotil 2.5-0.025 Mg Tabs (Diphenoxylate-atropine) .Marland Kitchen.. 1-2 by mouth qid as needed diarrhea 9)  Triamcinolone Acetonide 0.5 % Crea (Triamcinolone acetonide) .... Use two times a day prn 10)  First-bxn Mouthwash Susp (Diphenhyd-lidocaine-nystatin) .... 5 cc by mouth three times a day swish, gargle and spit 11)  Cipro 500 Mg Tabs (Ciprofloxacin hcl) .... Take as directed by md  Patient Instructions: 1)  Please schedule a follow-up appointment in 6 months well w/labs. Prescriptions: CIPRO 500 MG TABS (CIPROFLOXACIN HCL) take as directed by MD  #20 x 1   Entered and Authorized by:   Tresa Garter MD   Signed by:  Tresa Garter MD on 03/19/2010   Method used:   Electronically to        [facility] [phone]* (retail)       [address]       [place], [place]  [address]       Ph: [phone]       Fax: [phone]   RxID:   [id number]    Orders Added: 1)  Est. Patient Level III [44034]

## 2010-03-28 NOTE — Letter (Signed)
Summary: Colonoscopy Date Change Letter  [facility]  [address] [place], [place] [address]   Phone: [phone]  Fax: [phone]      March 21, 2010 MRN: [id number]   Patrick [name] [address] [place], [place]  [address]   Dear [name],   Previously you were recommended to have a repeat colonoscopy around this time. Your chart was recently reviewed by Dr. Claudette Head of [facility]. Follow up colonoscopy is now recommended in February 2015. This revised recommendation is based on current, nationally recognized guidelines for colorectal cancer screening and polyp surveillance. These guidelines are endorsed by the American Cancer Society, [employer] on Colorectal Cancer as well as numerous other major medical organizations.  Please understand that our recommendation assumes that you do not have any new symptoms such as bleeding, a change in bowel habits, anemia, or significant abdominal discomfort. If you do have any concerning GI symptoms or want to discuss the guideline recommendations, please call to arrange an office visit at your earliest convenience. Otherwise we will keep you in our reminder system and contact you 1-2 months prior to the date listed above to schedule your next colonoscopy.  Thank you,  Judie Petit T. Russella Dar, M.D.  [facility] [phone]

## 2010-03-28 NOTE — Medication Information (Signed)
Summary: rov/tm   Anticoagulant Therapy  Managed by: Weston Brass, PharmD Referring MD: Valera Castle MD PCP: Georgina Quint [name] MD Supervising MD: Eden Emms MD, Theron Arista Indication 1: Aortic Valve Replacement (ICD-V43.3) Indication 2: Aortic Arch Aneurysm (ICD-Aortic) Lab Used: [facility] Site: [facility] INR POC 1.8 INR RANGE 2 - 3  Dietary changes: no    Health status changes: no    Bleeding/hemorrhagic complications: no    Recent/future hospitalizations: no    Any changes in medication regimen? no    Recent/future dental: no  Any missed doses?: yes     Details: Missed 1/2 tablet a few days ago  Is patient compliant with meds? yes       Allergies: 1)  ! Codeine 2)  ! * Lamasil  Anticoagulation Management History:      The patient is taking warfarin and comes in today for a routine follow up visit.  Positive risk factors for bleeding include an age of 68 years or older.  The bleeding index is 'intermediate risk'.  Positive CHADS2 values include History of HTN.  Negative CHADS2 values include Age > 68 years old.  The start date was 03/05/2001.  Anticoagulation responsible provider: Eden Emms MD, Theron Arista.  INR POC: 1.8.  Cuvette Lot#: [id number].  Exp: 01/2011.    Anticoagulation Management Assessment/Plan:      The patient's current anticoagulation dose is Warfarin sodium 5 mg  tabs: take as diected.  The target INR is 2 - 3.  The next INR is due 04/15/2010.  Anticoagulation instructions were given to patient.  Results were reviewed/authorized by Weston Brass, PharmD.  He was notified by Linward Headland, PharmD candidate.         Prior Anticoagulation Instructions: Cont with current regimen Return to clinic on Jan 23rd, 0900 am  Current Anticoagulation Instructions: INR 1.8 (goal INR: 2-3)  Take an extra half tablet today (Tuesday).  Resume normal schedule on Wednesday of 1 and 1/2 tablets everyday except 1 tablet on Sundays, Tuesdays, and Thursdays.  Recheck in 4 weeks.

## 2010-04-15 ENCOUNTER — Encounter (INDEPENDENT_AMBULATORY_CARE_PROVIDER_SITE_OTHER): Payer: Medicare Other

## 2010-04-15 ENCOUNTER — Encounter: Payer: Self-pay | Admitting: Cardiovascular Disease

## 2010-04-15 DIAGNOSIS — I359 Nonrheumatic aortic valve disorder, unspecified: Secondary | ICD-10-CM

## 2010-04-15 DIAGNOSIS — Z954 Presence of other heart-valve replacement: Secondary | ICD-10-CM

## 2010-04-15 DIAGNOSIS — I712 Thoracic aortic aneurysm, without rupture, unspecified: Secondary | ICD-10-CM

## 2010-04-15 DIAGNOSIS — Z7901 Long term (current) use of anticoagulants: Secondary | ICD-10-CM

## 2010-04-23 NOTE — Medication Information (Signed)
Summary: rov/sp  Anticoagulant Therapy  Managed by: Bethena Midget, RN, BSN Referring MD: Valera Castle MD PCP: Tresa Garter MD Supervising MD: Clifton James MD, Cristal Deer Indication 1: Aortic Valve Replacement (ICD-V43.3) Indication 2: Aortic Arch Aneurysm (ICD-Aortic) Lab Used: [facility] Site: [facility] INR POC 2.2 INR RANGE 2 - 3  Dietary changes: no    Health status changes: no    Bleeding/hemorrhagic complications: no    Recent/future hospitalizations: no    Any changes in medication regimen? no    Recent/future dental: no  Any missed doses?: no       Is patient compliant with meds? yes       Allergies: 1)  ! Codeine 2)  ! * Lamasil  Anticoagulation Management History:      The patient is taking warfarin and comes in today for a routine follow up visit.  Positive risk factors for bleeding include an age of 68 years or older.  The bleeding index is 'intermediate risk'.  Positive CHADS2 values include History of HTN.  Negative CHADS2 values include Age > 14 years old.  The start date was 03/05/2001.  Anticoagulation responsible provider: Clifton James MD, Cristal Deer.  INR POC: 2.2.  Cuvette Lot#: [id number].  Exp: 03/2011.    Anticoagulation Management Assessment/Plan:      The patient's current anticoagulation dose is Warfarin sodium 5 mg  tabs: take as diected.  The target INR is 2 - 3.  The next INR is due 05/13/2010.  Anticoagulation instructions were given to patient.  Results were reviewed/authorized by Bethena Midget, RN, BSN.  He was notified by Bethena Midget, RN, BSN.         Prior Anticoagulation Instructions: INR 1.8 (goal INR: 2-3)  Take an extra half tablet today (Tuesday).  Resume normal schedule on Wednesday of 1 and 1/2 tablets everyday except 1 tablet on Sundays, Tuesdays, and Thursdays.  Recheck in 4 weeks.    Current Anticoagulation Instructions: INR 2.2 Continue 7.5mg s daily except 5mg s on Sundays, Tuesdays and Thrusdays. Recheck in 4 weeks.

## 2010-05-13 ENCOUNTER — Encounter (INDEPENDENT_AMBULATORY_CARE_PROVIDER_SITE_OTHER): Payer: Medicare Other

## 2010-05-13 ENCOUNTER — Encounter: Payer: Self-pay | Admitting: Cardiology

## 2010-05-13 DIAGNOSIS — Z954 Presence of other heart-valve replacement: Secondary | ICD-10-CM

## 2010-05-13 DIAGNOSIS — I2699 Other pulmonary embolism without acute cor pulmonale: Secondary | ICD-10-CM

## 2010-05-23 NOTE — Medication Information (Signed)
Summary: rov/tm      Allergies Added:  Anticoagulant Therapy  Managed by: Samantha Crimes, PharmD Referring MD: Valera Castle MD PCP: Georgina Quint [name] MD Supervising MD: Jens Som MD, Arlys John Indication 1: Aortic Valve Replacement (ICD-V43.3) Indication 2: Aortic Arch Aneurysm (ICD-Aortic) Lab Used: [facility]  Site: [facility] INR POC 2.3 INR RANGE 2 - 3  Dietary changes: no    Health status changes: no    Bleeding/hemorrhagic complications: no    Recent/future hospitalizations: no    Any changes in medication regimen? no    Recent/future dental: no  Any missed doses?: no       Is patient compliant with meds? yes       Current Medications (verified): 1)  Warfarin Sodium 5 Mg  Tabs (Warfarin Sodium) .... Take As Diected 2)  Tenormin 25 Mg  Tabs (Atenolol) .... Take 1 By Mouth Qd 3)  Lipitor 10 Mg  Tabs (Atorvastatin Calcium) .... Take 1 By Mouth Qd 4)  Imitrex 50 Mg  Tabs (Sumatriptan Succinate) .... Take 1 By Mouth Once Daily As Needed 5)  Avodart 0.5 Mg Caps (Dutasteride) .Marland Kitchen.. 1 By Mouth Qd 6)  Vitamin D3 1000 Unit  Tabs (Cholecalciferol) .Marland Kitchen.. 1 Once Daily 7)  Voltaren 1 %  Gel (Diclofenac Sodium) .... Two Times A Day  To Qid As Needed 8)  Lomotil 2.5-0.025 Mg Tabs (Diphenoxylate-Atropine) .Marland Kitchen.. 1-2 By Mouth Qid As Needed Diarrhea 9)  Triamcinolone Acetonide 0.5 % Crea (Triamcinolone Acetonide) .... Use Two Times A Day Prn 10)  First-Bxn Mouthwash  Susp (Diphenhyd-Lidocaine-Nystatin) .... 5 Cc By Mouth Three Times A Day Swish, Gargle and Spit 11)  Cipro 500 Mg Tabs (Ciprofloxacin Hcl) .... Take As Directed By Md  Allergies (verified): 1)  ! Codeine 2)  ! Corky Crafts  Anticoagulation Management History:      Positive risk factors for bleeding include an age of 68 years or older.  The bleeding index is 'intermediate risk'.  Positive CHADS2 values include History of HTN.  Negative CHADS2 values include Age > 13 years old.  The start date was 03/05/2001.  Anticoagulation  responsible provider: Jens Som MD, Arlys John.  INR POC: 2.3.  Exp: 03/2011.    Anticoagulation Management Assessment/Plan:      The patient's current anticoagulation dose is Warfarin sodium 5 mg  tabs: take as diected.  The target INR is 2 - 3.  The next INR is due 06/04/2010.  Anticoagulation instructions were given to patient.  Results were reviewed/authorized by Samantha Crimes, PharmD.         Prior Anticoagulation Instructions: INR 2.2 Continue 7.5mg s daily except 5mg s on Sundays, Tuesdays and Thrusdays. Recheck in 4 weeks.   Current Anticoagulation Instructions: Return to clinic in 3 weeks 4/10 @ 9:15 am Cont with current regimen

## 2010-05-24 ENCOUNTER — Other Ambulatory Visit: Payer: Self-pay | Admitting: Internal Medicine

## 2010-06-04 ENCOUNTER — Ambulatory Visit (INDEPENDENT_AMBULATORY_CARE_PROVIDER_SITE_OTHER): Payer: Medicare Other | Admitting: *Deleted

## 2010-06-04 DIAGNOSIS — Z7901 Long term (current) use of anticoagulants: Secondary | ICD-10-CM

## 2010-06-04 DIAGNOSIS — I359 Nonrheumatic aortic valve disorder, unspecified: Secondary | ICD-10-CM

## 2010-06-04 DIAGNOSIS — I712 Thoracic aortic aneurysm, without rupture, unspecified: Secondary | ICD-10-CM

## 2010-07-01 ENCOUNTER — Ambulatory Visit (INDEPENDENT_AMBULATORY_CARE_PROVIDER_SITE_OTHER): Payer: Medicare Other | Admitting: *Deleted

## 2010-07-01 DIAGNOSIS — I712 Thoracic aortic aneurysm, without rupture, unspecified: Secondary | ICD-10-CM

## 2010-07-01 DIAGNOSIS — Z7901 Long term (current) use of anticoagulants: Secondary | ICD-10-CM

## 2010-07-01 DIAGNOSIS — I359 Nonrheumatic aortic valve disorder, unspecified: Secondary | ICD-10-CM

## 2010-07-01 LAB — POCT INR: INR: 2.3

## 2010-07-09 NOTE — Assessment & Plan Note (Signed)
[facility]                            CARDIOLOGY OFFICE NOTE   NAME:Patrick [name], Patrick [name]                       MRN:          [id number]  DATE:02/29/2008                            DOB:          12-10-1940    Mr. Hoagland comes in today for followup.   PROBLEM LIST:  1. 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 Jude conduit replacement in January 2003.  His last 2D      echocardiogram in December 2007 was stable.  2. Normal coronary arteries at the time of that catheterization and      procedure.  3. Normal left ventricular function.  4. Labile hypertension.  5. Asymptomatic sinus bradycardia.  He is still asymptomatic.  There      was heart rates in the mid 40s.  6. Hyperlipidemia followed by Dr. Posey Rea.  7. Anticoagulation followed here in our [facility].   He has got a lot of stress going on in his life.  His son has become  disabled with a back injury and his daughter-in-law who just had a baby  6 months ago has developed aggressive MS.  He is on the road all the  time to [place] to take care of his grandchild along with his wife.  He still looks great and still has a great spirit.   MEDICATIONS:  Unchanged since his last visit.  Please refer to the  maintenance medication list.   PHYSICAL EXAMINATION:  VITAL SIGNS:  His blood pressure is 136/80, his  pulse is 46 and regular, his weight is 178.  HEENT:  Normal.  NECK:  Carotid upstrokes were equal bilaterally without bruits, no JVD.  Thyroid is not enlarged.  Trachea is midline.  LUNGS:  Clear to auscultation and percussion.  HEART:  Nondisplaced PMI.  There is a slow rate and rhythm.  He has a  prosthetic S2.  There is no diastolic component.  There is no gallop.  There is no right ventricular lift.  ABDOMEN:  Soft, good bowel sounds.  No midline bruit.  No pulsatile  mass.  EXTREMITIES:  No cyanosis, clubbing, or edema.  Pulses are intact.  NEUROLOGIC:  Intact.   His electrocardiogram shows sinus bradycardia with first-degree AV block  borderline, normal QRS, normal QTC interval.   ASSESSMENT AND PLAN:  Mr. Hoagland is doing well.  We will line him up for  a 2D echocardiogram to reassess his aortic valve, his aortic root, and  his left ventricular function.  He  knows his symptoms are bradycardia which are reviewed again today with  him.  If he is doing well, we will see him back again in 6 months.     Thomas C. Daleen Squibb, MD, [facility]  Electronically Signed    TCW/MedQ  DD: 02/29/2008  DT: 03/01/2008  Job #: [id number]

## 2010-07-09 NOTE — Assessment & Plan Note (Signed)
[facility]                            CARDIOLOGY OFFICE NOTE   NAME:HOLMESBrendyn, Mclaren                       MRN:          [id number]  DATE:01/11/2007                            DOB:          1940-07-03    SUBJECTIVE:  Mr. Woodberry returns today for the management of the  following issues:  1. History of ascending aortic aneurysm with aortic insufficiency,      status post St. Jude conduit replacement in January 2003.  A 2-D      echocardiogram on January 27, 2006, ws stable.  2. Normal coronary arteries.  3. Normal left ventricular systolic function.  4. Labile hypertension.  5. Asymptomatic sinus bradycardia.  6. Hyperlipidemia, followed by Dr. [name], at goal.  7. Anticoagulation.   His biggest problem has been migraines, about three times a week.  He  says he has them when he pushes himself exercising.  He says if he  breathes a little bit deeper and more methodical, it lessens the  intensity or frequency of these.  He denies any other neurological  symptoms.   CURRENT MEDICATIONS:  Are unchanged since his last visit.   OBJECTIVE:  VITAL SIGNS:  Blood pressure today 115/70, pulse 45 and  sinus bradycardia.  His electrocardiogram shows a first-degree AV block  of 220 msec with some T-wave changes inferiorly which is different.  His  weight is 178 pounds.  HEENT:  Ruddy complexion.  Normocephalic and atraumatic  Pupils equal,  round, reactive to light and accommodation.  Extraocular movements  intact.  Sclerae clear.  Facial symmetry is normal.  NECK:  Carotid upstrokes equal bilaterally without bruits.  There is no  jugular venous distention. Thyroid is not enlarged.  Trachea is midline.  LUNGS:  Clear.  HEART:  Reveals a crisp S2.  There is no diastolic murmur, no gallop.  He has a slow rate.  ABDOMEN:  Soft, good bowel sounds, no midline bruits.  EXTREMITIES:  No clubbing, cyanosis or edema.  Pulses intact.  NEUROLOGIC:   Intact.   PLAN:  I have spoken to Dr. Posey Rea about his migraines.  Will try low-  dose verapamil 120 mg sustained daily.  We will stop his Tenormin.   FOLLOWUP:  I will plan on seeing him back from a cardiac standpoint in  six months.    Thomas C. Daleen Squibb, MD, [facility]  Electronically Signed   TCW/MedQ  DD: 01/11/2007  DT: 01/11/2007  Job #: [id number]   cc:   [name], MD
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
## 2010-07-12 NOTE — Procedures (Signed)
[place]. [facility]  Patient:    Patrick [name], Patrick [name] Visit Number: [id number] MRN: [id number]          Service Type: MED Location: 2300 [id number] 01 Attending Physician:  Cleatrice Burke Dictated by:   Guadalupe Maple, M.D. Proc. Date: 03/03/01 Admit Date:  03/02/2001                             Procedure Report  PROCEDURE: Intraoperative transesophageal echocardiography.  ANESTHESIOLOGIST: Guadalupe Maple, M.D.  INDICATIONS FOR PROCEDURE: [name] is a 70 year old white male, with a history of an aneurysm of his ascending aorta and aortic insufficiency.  He is scheduled to undergo aortic valve replacement and root replacement by Dr. Laneta Simmers under general anesthesia.  I was requested to insert a transesophageal echocardiography probe to evaluate the extent of the aortic valve pathology, determine the size of the aneurysm, to assess left ventricular function, and to determine if any other valvular pathology was present.  DESCRIPTION OF PROCEDURE: The patient was brought to the operating room at [facility] and general anesthesia was induced without difficulty.  The trachea was then intubated easily and the transesophageal echocardiography probe inserted into the esophagus without difficulty.  IMPRESSION, PREBYPASS FINDINGS:  1. 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  The valve annulus     measured 2.6 cm.  There was a jet of aortic insufficiency which was     diagonally directed from the area of the medial commissure directed     laterally toward the lateral commissure of the bicuspid valve, with the     jet coming in contact with the anterior leaflet of the mitral valve.     It was graded at 2+ jet.  There was no restriction to opening of the     aortic valve and no significant calcification of the leaflets.  The     aortic root at size of Valsalva measured 4.3 cm, at the sinotubular     ridge measured 3.65 cm in  diameter, and the proximal aorta above the     sinotubular ridge measured 4.7-4.8 cm diameter.  2. Mitral valve appeared normal.  There was good coaptation of the leaflets     without prolapse or fluttering.  There was 1+ to trace mitral     insufficiency.  3. Mild to moderate left ventricular dilation, with normal left ventricular     function and mild to moderate left ventricular hypertrophy.  There was     good contractility in all segments.  The left ventricular wall measured     1.4 cm in diastole at the mid papillary level.  4. Normal appearing right ventricular function.  5. Intact interatrial septum without atrioseptal defect or patent foramen     ovale.  6. The left atrium appeared to be within normal limits of size, without     thrombus in the left atrium or left atrial appendage.  7. Tricuspid valve showed trace to 1+ tricuspid insufficiency.  IMPRESSION, POSTBYPASS FINDINGS:  1. Aortic root and valve replacement using a conduit.  The prosthetic     aortic valve appeared well seated, with no paravalvular insufficiency     appreciated.  There was no pericardial effusion.  2. Left ventricular function again appeared normal, with good left     ventricular contractility.  Ejection fraction estimated at 55-60%.  3. Mitral valve appeared unchanged from the prebypass  study, with normal     appearing mitral valve function. Dictated by:   Guadalupe Maple, M.D. Attending Physician:  Cleatrice Burke DD:  03/03/01 TD:  03/04/01 Job: [id number]

## 2010-07-12 NOTE — Assessment & Plan Note (Signed)
[facility]                           PRIMARY CARE OFFICE NOTE   NAME:Patrick [name], Patrick [name]                       MRN:          [id number]  DATE:01/19/2006                            DOB:          Jan 08, 1941    The patient is a 70 year old male who presents for wellness examination.   PAST MEDICAL HISTORY/FAMILY HISTORY/SOCIAL HISTORY:  As per December 25, 2004 note.   CURRENT MEDICATIONS:  Reviewed with the patient.   ALLERGIES:  Includes CODEINE and LAMISIL.   REVIEW OF SYSTEMS:  No chest pain or shortness of breath.  Chronic  problems with prostatism, occasional migraines.  The rest of the 18-  system review is unremarkable.   PHYSICAL:  Weight 176 pounds, temp 97.5, pulse 47, blood pressure  132/70.  He looks well.  He is in no acute distress.  HEENT:  With moist mucosa.  NECK:  Supple.  No thyromegaly or bruit.  LUNGS:  Clear to auscultation and percussion.  No wheeze or rales.  HEART:  S1, S2, click.  ABDOMEN:  Soft and nontender.  No organomegaly or mass felt.  LOWER EXTREMITIES:  Without edema.  He is alert, oriented, and cooperative.  Denies being depressed.  SKIN:  With aging changes.  RECTAL:  Not done due to pending urology visit.   LABS:  Done January 12, 2006, CBC normal, CMET normal, cholesterol 143,  LDL 82, HDL 45.3, and TSH normal, PSA 6.09.  Urinalysis normal.  EKG  with sinus rhythm, no acute changes.   ASSESSMENT AND PLAN:  1. Wellness examination.  Age/health issues discussed.  Healthy life-      style discussed.  Repeat exam in 12 months.  Will obtain chest x-      ray.  2. Elevated prostate specific antigen/benign prostatic hypertrophy.      He has an appointment with Dr. Aldean Ast next week.  3. Migraine headaches.  He is using Imitrex 100 mg daily p.r.n.,      infrequently.  4. Dyslipidemia.  Continue current therapy.  5. International travel.  I gave him a prescription for Cipro 500 p.o.      b.i.d. p.r.n., Z-Pak  as needed p.r.n. Given the second hepatitis A      vaccine and a flu shot.  6. Artificial valve.  Amoxicillin 2 g 1 hour prior to dental work.  7. Chronic anticoagulation for Coumadin clinic.  Followup with Dr.      Daleen Squibb.     [name], MD  Electronically Signed    AVP/MedQ  DD: 01/20/2006  DT: 01/20/2006  Job #: [id number]   cc:   Courtney [name], M.D.  Thomas C. Wall, MD, [facility]
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
## 2010-07-12 NOTE — Cardiovascular Report (Signed)
[place]. [facility]  Patient:    Patrick [name], Patrick [name] Visit Number: [id number] MRN: [id number]          Service Type: CAT Location: [facility] 2871 01 Attending Physician:  Lenoria Farrier Dictated by:   Everardo Beals Juanda Chance, M.D. [facility] Proc. Date: 03/02/01 Admit Date:  03/02/2001   CC:         Patrick [name], M.D. [facility]  Alleen Borne, M.D.  Cardiopulmonary Lab   Cardiac Catheterization  CLINICAL HISTORY:  [name] is 70 years old and has a known ascending aortic aneurysm.  This has been followed by Dr. Daleen [name] and Dr. Laneta Simmers.  Recently by CT scan, the aneurysm was found to have increased in size to 5.2 cm, and Dr. Laneta Simmers felt it was time to repair the aneurysm.  He is now scheduled for preoperative cardiac catheterization.  PROCEDURE: 1. Cardiac catheterization. 2. Aortic root injection.  CARDIOLOGISTEverardo Beals Juanda Chance, M.D. [facility]  PROCEDURE IN DETAIL:  The procedure was performed via the right femoral artery using a arterial sheath and 6-French preformed coronary catheters.  A frontal arterial punch was performed, and Omnipaque contrast was used.  A #6 left diagnostic catheter was used.  Aortic root injection was performed to evaluate the aneurysm.  The patient tolerated the procedure well and left the laboratory in satisfactory condition.  RESULTS:  Left main coronary artery was free of significant disease.  Left anterior descending artery gave rise to a large diagonal branch, a large septal perforator, two small septal perforators, and a small diagonal branch.  The study was irregular, but there was no significant obstruction.  The left circumflex artery gave rise to a first marginal branch, a second marginal branch, and a posterolateral branch.  These vessels were free of significant disease.  The right coronary artery was a dominate vessel that gave rise to a conus branch, a right ventricular branch, a posterior descending branch, and  two posterolateral branches.  These vessels were free of significant disease.  The left ventriculogram performed in the RAO projection showed good wall motion with no areas of hypokinesis.  The estimated ejection fraction of 60%.  The aortic root injection showed a markedly dilated ascending aortic root. We were unable to assess the aortic insufficiency very well.  HEMODYNAMIC DATA:  The aortic pressure was 125/68 with mean of 89.  The left ventricular pressure was 125/15.  CONCLUSIONS: 1. Large ascending aortic aneurysm. 2. Normal coronary angiography. 3. Normal left ventricular function.  RECOMMENDATIONS:  The patient is scheduled for aneurysm repair by Dr. Laneta Simmers tomorrow. Dictated by:   Everardo Beals Juanda Chance, M.D. [facility] Attending Physician:  Lenoria Farrier DD:  03/02/01 TD:  03/02/01 Job: [id number]

## 2010-07-12 NOTE — Op Note (Signed)
[place]. [facility]  Patient:    Patrick, [name] Visit Number: [id number] MRN: [id number]          Service Type: MED Location: 2300 2305 01 Attending Physician:  Cleatrice Burke Dictated by:   Alleen Borne, M.D. Proc. Date: 03/03/01 Admit Date:  03/02/2001   CC:         CVTS Office  Thomas C. Wall, M.D. [facility]  Cardiac Cath Lab   Operative Report  PREOPERATIVE DIAGNOSES: 1. 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 Ascending aortic aneurysm.  POSTOPERATIVE DIAGNOSES: 1. 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 Ascending aortic aneurysm.  OPERATIONS PERFORMED: 1. Median sternotomy. 2. Extracorporeal circulation. 3. Aortic valve replacement. 4. Replacement of the ascending aorta with a 25 mm St. Jude valve conduit    with reimplantation of the coronary arteries.  SURGEON:  Alleen Borne, M.D.  ASSISTANT:  Gwenith Daily. Tyrone Sage, M.D.  ANESTHESIA:  General endotracheal.  CLINICAL HISTORY:  This patient is a 70 year old gentleman who was previously healthy and who was diagnosed with bicuspid aortic valve around 1984 or 1985. He has been followed with periodic echocardiograms.  His last echocardiogram in November of 2002 showed mild aortic insufficiency with bicuspid aortic valve.  The aortic root measurement was about 4.9 cm.  He underwent a CT scan of the chest at that time that showed his ascending aorta was dilated to a maximum dimension of 5.3 cm.  I saw him recently and repeat CT scan showed an ascending aortic aneurysm beginning at the level of the valve and extending to the takeoff of the innominate artery.  The maximum dimension was about 5 cm to 5.2 cm just above the aortic valve and appeared unchanged from his previous CT scan one year ago.  The descending aorta measured 2.5 cm.  After discussion with the patient it appeared that he was beginning to have symptoms  attributable to his aortic insufficiency with generalized weakness and shortness of breath with exertion.  He also had several episodes of lightheadedness.  After review of his CT scan and echocardiogram, and review of his history I felt the best treatment would be to proceed with aortic valve replacement and replacement of his ascending aorta using a St. Jude valve conduit.  He was brought in for cardiac catheterization, which showed normal coronary arteries and normal left ventricular function.  A root injection showed the ascending aortic aneurysm.  I discussed the operative procedure with the patient and his wife and son, including alternatives, benefits and risks including bleeding, possible blood transfusion, infection, stroke, myocardial infarction, and death.  I also discussed the need for chronic anticoagulation with Coumadin for the rest of his life.  They understood and agreed to proceed.  OPERATIVE PROCEDURE:  The patient was brought to the operating room and placed on the table in the supine position.  After induction of general endotracheal anesthesia a Foley catheter was placed in his bladder using sterile technique.  Then the chest, abdomen and both lower extremities were prepped and draped in the usual sterile manner.  Transesophageal echocardiogram was performed by anesthesiology.  This showed a bicuspid aortic valve with moderate aortic insufficiency.  Left ventricular function was well preserved with some left ventricular hypertrophy.  The right ventricular function appeared normal.  Then the chest was entered through a median sternotomy incision and the pericardium opened in the midline.  Examination of the heart showed good ventricular contractility.  The ascending aorta  was dilated to about 5 cm. The aneurysmal segment began at the level of the annulus and extended up to a point not quite at the innominate artery.  Then the patient was heparinized and when an  adequate activated clotting time was achieved the distal ascending aorta was cannulated using a 20 French aortic cannula for arterial input. Venous outflow was achieved using a two-stage venous cannula for the right atrial appendage.  An antegrade cardioplegia and vent cannula was inserted in the aortic root.  The patient was placed on cardiopulmonary bypass and distal coronary artery was identified.  The patient had no coronary plaque.  Then the aorta was crossclamped and 500 cc of cold blood antegrade cardioplegia was administered in the aortic root.  This was followed by 500 cc of cold blood retrograde cardioplegia through a coronary sinus catheter inserted through the right atrium.  A left ventricular vent had been placed through the right superior pulmonary vein.  Additional doses of cardioplegia were given at about 30-minute intervals to maintain myocardial temperature around 10-15 degrees centigrade.  The ascending aorta was then opened.  The position of the coronary arteries was identified.  The native valve was a functionally bicuspid valve.  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  The valve leaflets were larger than normal and floppy.  The annulus appeared mildly dilated.  Then the right and left coronary ostia were excised as buttons from the aortic wall.  Care was taken to prevent rotation.  The native valve was excised.  The ascending aortic aneurysm was excised.  Both specimens were sent to pathology.  The annulus was incised and a 25 mm St. Jude valve conduit was chosen.  Then a series of pledgetted 2-0 Ethibond horizontal mattress sutures were placed around the annulus with the pledgets in the subannular position.  The sutures  were placed the sewing ring on the valve and the valve lower into place. Sutures were tied sequentially and the valve appeared to seat well.  The leaflets were functioning  normally.  Then the left coronary button was anastomosed to the left side of the conduit in an end-to-side manner using continued 5-0 Prolene suture.  Then the graft was cut to the appropriate length.  The distal anastomosis was then performed between the graft and the distal ascending aorta in an end-to-end manner using continuous 3-0 Prolene suture.  Then the aortic graft was distended with blood.  The right side of the heart was filled with blood and the position of the right coronary button was measured on the graft.  Then the right coronary button was anastomosed to the aortic graft in an end-to-side manner using continuous 5-0 Prolene suture.  The proximal and distal anastomoses of the aorta and the two coronary anastomoses were then lightly coated with BioGlue to aid in hemostasis.  The patient was rewarmed to 37 degrees centigrade.  The left side of the heart was deaired.  Then the head was placed in the Trendelenburg position.  The crossclamp was then removed with a time of 129 minutes.  There was spontaneous return of sinus rhythm.  The proximal and distal aortic suture line appeared hemostatic.  The coronary button anastomoses appeared hemostatic.  The temporary right ventricular and right atrial pacing wires were _____ and brought out through skin.  When the patient was rewarmed to 37 degrees centigrade he was weaned from cardiopulmonary bypass on low-dose dopamine.  Total bypass time  was 175 minutes.  Cardiac function appeared good with a cardiac output of 5 liters a minute.  Transesophageal echocardiogram was performed and the St. Jude valve prosthesis appeared to be functioning normally.  There was no evidence of aortic insufficiency.  There was no mitral regurgitation.  Left ventricular function appeared well preserved.  Right ventricular function appeared well preserved.  Then protamine was given and the venous and aortic cannulae were removed without difficulty.   Hemostasis was achieved without difficulty.  Three chest tubes were placed with two in the posterior pericardium and one in the anterior mediastinum.  The pericardium was reapproximated over the heart.  The sternum was closed with #6 stainless steel wires.  The fascia was closed with a continuous #1 Vicryl suture.  Subcutaneous tissue was closed with continuous 2-0 Vicryl and the skin with 3-0 Vicryl subcuticular closure.  The sponge, needle and instrument counts were correct according to the scrub nurse.  Dry sterile dressings were applied the incisions, around the chest tubes, which were hooked to Pleur-Evac suction.  The patient remained hemodynamically stable and was transported to the SICU in guarded, but stable condition. Dictated by:   Alleen Borne, M.D. Attending Physician:  Cleatrice Burke DD:  03/03/01 TD:  03/03/01 Job: [id number]

## 2010-07-12 NOTE — Assessment & Plan Note (Signed)
[facility]                              CARDIOLOGY OFFICE NOTE   NAME:HOLMESHuber, Mathers                       MRN:          [id number]  DATE:01/20/2006                            DOB:          1940/12/14    Mr. Charters returns today for further management of the following issues:   1. History of ascending aortic aneurysm with aortic insufficiency, status      post St. Jude conduit replacement, January 2003.  He had normal      coronary arteries at that time and a normal left ventricular systolic      function.  A 2-D echocardiogram stable 2005.  He is totally      asymptomatic.  2. Labile hypertension.  3. Hyperlipidemia:  His lipids are at goal as checked by Dr. Posey Rea      recently.  Specifically, cholesterol is 143, HDL 45.3, LDL was 82,      triglycerides 80, LFTs were normal.  4. Anticoagulation:  INR 3.4 today followed by our Coumadin Clinic.   He has no cardiovascular complaints.  He denies any presyncope or syncope.  He is always bradycardic in the high 40s and low 50s on low-dose Tenormin.   His medicines today are Coumadin as directed, Flomax 0.4 mg a day, vitamin C  daily, calcium daily, Lipitor 10 mg daily, Saw palmetto 2 daily, selenium 1  daily, Tenormin 25 mg p.o. daily.   His blood pressure is 130/68.  His pulse is 48 and sinus bradycardic.  EKG  shows mild LVH with sinus bradycardia.  His weight is 174, down 12.  HEENT  is normocephalic and atraumatic.  PERRLA.  Extraocular movements intact.  Sclerae are clear.  Facial symmetry is normal.  Carotid upstrokes are equal  bilaterally without bruits.  There is no JVD.  Thyroid is not enlarged.  Trachea is midline.  Lungs are clear.  Heart reveals a regular rate and  rhythm with a crisp S2.  No diastolic component could be appreciated.  S2  splits.  Abdominal exam is soft with good bowel sounds.  There is no midline  bruit.  There is no hepatomegaly.  Extremities reveal no clubbing,  cyanosis,  or edema.  Pulses are intact.   I am very pleased with how Mr. Yetta Barre is doing.  We have arranged for him to  have a 2-D echocardiogram to follow up on his aortic valve and any aortic  insufficiency.  I have made no changes in his medical program.  We will plan  on seeing him back in a year.     Thomas C. Daleen Squibb, MD, [facility]  Electronically Signed    TCW/MedQ  DD: 01/20/2006  DT: 01/21/2006  Job #: [id number]

## 2010-07-12 NOTE — Consult Note (Signed)
[place]. [facility]  Patient:    Patrick [name], Patrick [name] Visit Number: [id number] MRN: [id number]          Service Type: MED Location: 2000 2039 01 Attending Physician:  Cleatrice Burke Dictated by:   Tereso Newcomer, P.A. Admit Date:  03/02/2001 Discharge Date: 03/07/2001   CC:         Alleen Borne, M.D.             Thomas C. Wall, M.D. [facility]             Sonda Primes, M.D. [facility]                          Consultation Report  DATE OF BIRTH:  Mar 13, 1940  HISTORY OF PRESENT ILLNESS:  Patrick [name] is a 70 year old male who is status post aortic valve replacement with a mechanical St. Jude valve on March 03, 2001 by Dr. Laneta Simmers.  The patient did well postoperatively and had an uneventful hospital course.  He was anticoagulated with Coumadin and is currently being followed in our Coumadin clinic.  Patrick [name] was in cardiac rehab today and noted to the staff that he was having right flank pain for the past 4-5 days.  The first day that he developed this pain, he awoke with it.  He had no pain prior to this time.  He denies any radiation into his groin.  He denies any hematuria.  His pain is worse with deep inspiration.  He has had problems since his surgery due to the endotracheal tube with coughing when he takes deep breaths.  He denies any chest pain.  He has had some migraine headaches over the last several weeks and he has recently been set up with a neurology appointment through our office.  This appointment was scheduled for tomorrow but he is awaiting followup with Dr. Daleen Squibb on Friday, January 31, before he sees the neurologist. He does have a history of pneumothorax.  His pain today is nothing like that. He also has a history of nephrolithiasis and this pain is not like his pain that he has had with kidney stones.  He is walking about 20 minutes a day.  He denies any exertional pain.  He denies any orthopnea, PND, increased lower extremity edema, or  syncope.  He does, however, note myalgias in different areas.  He has noted a burning-type sensation and cramping sensation in his legs at times, his arms at times, etc.  He denies any trauma to his back.  PAST MEDICAL HISTORY:  Significant for BPH, hypertension, hyperlipidemia, spontaneous pneumothorax, nephrolithiasis, and history of ruptured lumbar disk.  MEDICATIONS: 1. Tylox p.r.n. 2. Atenolol 25 mg a day. 3. Lipitor 10 mg a day. 4. Coumadin.  ALLERGIES:  CODEINE.  SOCIAL HISTORY:  He is an ex-smoker.  He quit in 1977.  REVIEW OF SYSTEMS:  Please refer to the HPI.  He denies any dysuria or hematuria.  He does note increased frequency and nocturia.  He denies melena. He did have some scant bright red blood per rectum after a period of constipation a few days ago but has had no bleeding since.  He denies any rashes, fevers, chills.  His cough is nonproductive.  PHYSICAL EXAMINATION:  VITAL SIGNS:  Blood pressure 106/72, pulse 83, weight 166 pounds.  His weight was 167.8 pounds on January 28.  HEENT:  Unremarkable.  NECK:  Without JVD.  LUNGS:  Clear  to auscultation bilaterally with good breath sounds.  HEART:  Normal S1, increased S2.  Regular rate and rhythm.  Good valve sounds.  ABDOMEN:  Positive bowel sounds.  No tenderness, soft.  BACK:  No CVA tenderness noted to percussion.  He does have a localized area in the right flank just posterior to the midaxillary line at about the level of the 10th to 11th rib that is exquisitely tender upon palpation.  EXTREMITIES:  Extremities x4 without edema, full range of motion.  NEUROLOGIC:  Grossly intact.  ASSESSMENT: 1. 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 2. Right flank pain. 3. Myalgias. 4. Hypertension. 5. Hyperlipidemia. 6. History of spontaneous pneumothorax (right  lung). 7. History of nephrolithiasis. 8. History of ruptured lumbar disk.  PLAN:  I discussed the case over the phone with Dr. Daleen Squibb today.  We will go ahead and get an abdominal series to include a chest x-ray to rule out recurrence of his pneumothorax or any other intrathoracic abnormality.  His flank pain does have a strong pleuritic component.  We will also check a KUB and the abdominal series to rule out nephrolithiasis.  We will also send him to the lab today to get a CMET to include LFTs to rule out any elevation in his liver function tests, since he is on Lipitor.  We will also check his electrolytes with a CMP.  We will check a CBC to make sure he does not have any elevations in his white count or anemia.  We will also check a urinalysis to rule out hematuria, which could signify recurrence of his nephrolithiasis. I have given the patient a prescription for Motrin 800 mg t.i.d.  I have discussed the case with Shelby Dubin, our clinical pharmacist.  His INR was 3.0 on January 20.  She feels the Motrin should have no significant interaction with the Coumadin at this point.  I will also ask Patrick [name] to hold his Lipitor until seen back in the office on Friday with Dr. Daleen Squibb.  I have also told Patrick [name] if he has any increase in his symptoms, he is to report to the emergency room as soon as possible.  This patient does have an appointment with Dr. Daleen Squibb on Friday of this week, January 31. Dictated by:   Tereso Newcomer, P.A. Attending Physician:  Cleatrice Burke DD:  03/24/01 TD:  03/24/01 Job: [id number]

## 2010-07-12 NOTE — Procedures (Signed)
[facility]                                  PROCEDURE NOTE   NAME:HOLMESDavier, Tramell                       MRN:          [id number]  DATE:12/17/2005                            DOB:          10-22-1940    PROCEDURE PERFORMED:  Skin biopsy.   PHYSICIAN:  [name], M.D.   INDICATIONS:  Irritated mole, left upper chest wall, 6 mm.  Risks including  excessive bleeding on Coumadin, infection, incomplete procedure and etc were  explained to him in detail.  He agreed to proceed.   DESCRIPTION OF THE PROCEDURE:  The patient was placed in the right decubitus  position.  The area was prepped with Betadine and alcohol.  Local anesthesia  with 0.5 mL of 2% lidocaine with epinephrine was given.  A shaved biopsy  with a sterile blade was performed.  The specimen was sent to the lab.  The  wound was treated with Hyfrecater to complete hemostasis and a Band-Aid with  antibiotic ointment was applied.   CONDITION:  The patient tolerated the procedure well.   COMPLICATIONS:  There were no complications.   SPECIMENS:  The specimen was sent to the lab.   INSTRUCTIONS:  Wound instructions were provided.  I gave him silver nitrate  sticks to use at home as needed for bleeding.            ______________________________  [name], MD      AVP/MedQ  DD:  12/17/2005  DT:  12/18/2005  Job #:  [id number]

## 2010-07-12 NOTE — Discharge Summary (Signed)
[place]. [facility]  Patient:    JAISON, PETRAGLIA Visit Number: [id number] MRN: [id number]          Service Type: MED Location: 2000 2039 01 Attending Physician:  Cleatrice Burke Dictated by:   Adair Patter, P.A. Admit Date:  03/02/2001 Discharge Date: 03/07/2001   CC:         CVTS office  Maisie Fus C. Wall, M.D. [facility]  Patients chart   Discharge Summary  DATE OF BIRTH:  10-Aug-1940.  ADMITTING DIAGNOSIS:  Aortic valve disease.  DISCHARGE DIAGNOSIS:  Aortic valve disease.  HOSPITAL COURSE:  Mr. Spadafore was admitted to [facility] on March 02, 2001.  He was admitted to the hospital secondary to having aortic insufficiency and a bicuspid aortic valve.  He has been followed for this problem for many years.  He was seen and evaluated by Dr. Alleen Borne as an outpatient.  He was also found to have ascending aortic aneurysm.  Dr. Alleen Borne recommended the patient undergo surgical correction of this problem.  The patient was admitted to [facility] on March 02, 2001; at which time, he underwent cardiac catheterization which revealed the patient did not have any coronary artery disease requiring surgical bypass.  On March 03, 2001, the patient underwent an event wall procedure with the placement of an aortic valve in ascending aorta with a St. Jude valve conduit. No complications were noted during the procedure.  Postoperatively, the patient had an uneventful hospital course.  He was anticoagulated with Coumadin and subsequently discharged home on March 07, 2001.  DISCHARGE MEDICATIONS: 1. Tylox 1-2 tablets q.4-6h. p.r.n. pain. 2. Atenolol 25 mg 1 q.d. 3. Lipitor 10 mg 1 q.d. 4. Coumadin (his final Coumadin dose will be dictated by his INR at the time    of discharge).  ACTIVITY:  The patient was to avoid driving, strenuous activity, and lifting heavy objects.  He was told to walk daily.  Continue  his breathing exercises.  DIET:  Low fat and low salt.  WOUND CARE:  The patient was told he could shower and clean his incision with soap and water.  DISPOSITION:  To home.  FOLLOW-UP:  The patient was told to call his cardiologist, Dr. Maisie Fus C. Wall, for an appointment in two weeks.  He was also told to have his INR checked by Dr. Maisie Fus C. Wall in two to three days.  His appointment with Dr. Alleen Borne will be in three weeks.  The office will call to verify time of this appointment.  He was told to bring chest x-rays to Dr. Judithann Sheen office with him. Dictated by:   Adair Patter, P.A. Attending Physician:  Cleatrice Burke DD:  03/06/01 TD:  03/07/01 Job: [id number]

## 2010-07-29 ENCOUNTER — Encounter (INDEPENDENT_AMBULATORY_CARE_PROVIDER_SITE_OTHER): Payer: Medicare Other | Admitting: *Deleted

## 2010-07-29 DIAGNOSIS — Z7901 Long term (current) use of anticoagulants: Secondary | ICD-10-CM

## 2010-07-29 DIAGNOSIS — R0989 Other specified symptoms and signs involving the circulatory and respiratory systems: Secondary | ICD-10-CM

## 2010-07-29 DIAGNOSIS — I712 Thoracic aortic aneurysm, without rupture, unspecified: Secondary | ICD-10-CM

## 2010-07-29 DIAGNOSIS — I359 Nonrheumatic aortic valve disorder, unspecified: Secondary | ICD-10-CM

## 2010-08-01 ENCOUNTER — Other Ambulatory Visit: Payer: Self-pay | Admitting: Internal Medicine

## 2010-08-05 ENCOUNTER — Encounter: Payer: Self-pay | Admitting: Internal Medicine

## 2010-08-05 ENCOUNTER — Other Ambulatory Visit: Payer: Self-pay | Admitting: Internal Medicine

## 2010-08-05 ENCOUNTER — Ambulatory Visit (INDEPENDENT_AMBULATORY_CARE_PROVIDER_SITE_OTHER): Payer: Medicare Other | Admitting: Internal Medicine

## 2010-08-05 DIAGNOSIS — I10 Essential (primary) hypertension: Secondary | ICD-10-CM

## 2010-08-05 DIAGNOSIS — Z7901 Long term (current) use of anticoagulants: Secondary | ICD-10-CM

## 2010-08-05 DIAGNOSIS — E785 Hyperlipidemia, unspecified: Secondary | ICD-10-CM

## 2010-08-05 DIAGNOSIS — R071 Chest pain on breathing: Secondary | ICD-10-CM

## 2010-08-05 DIAGNOSIS — R0789 Other chest pain: Secondary | ICD-10-CM

## 2010-08-05 MED ORDER — CLOTRIMAZOLE-BETAMETHASONE 1-0.05 % EX CREA: TOPICAL_CREAM | Freq: Two times a day (BID) | CUTANEOUS | Status: AC

## 2010-08-05 MED ORDER — CIPROFLOXACIN HCL 500 MG PO TABS: 500.0000 mg | ORAL_TABLET | Freq: Two times a day (BID) | ORAL | Status: DC

## 2010-08-05 NOTE — Telephone Encounter (Signed)
Ok to Rf? 

## 2010-08-05 NOTE — Assessment & Plan Note (Addendum)
Meds given  Procedure Note :    Procedure :   Sonography examination - chest   Indication: R chest pain    Equipment used: Sonosite M-Turbo with HFL38x/13-6 MHz transducer linear probe. The images were stored in the unit and later transferred in storage.  The patient was placed in a sitting position. Both lungs were scanned with most attention to the area of pain.  This study revealed no rib fractures, masses, pneumothorax or effusion  Impression: negative chest [place] scan

## 2010-08-05 NOTE — Patient Instructions (Signed)
Call if worse

## 2010-08-05 NOTE — Progress Notes (Signed)
  Subjective:    Patient ID: Patrick [name], male    DOB: [birth year], 71 y.o.   MRN: [id number]  HPI  C/o R sided rib cage pain that started 4-5 h after playing golf on Sat. Worse with movement. It is better today. He is on Coumadin, statin. F/u HTN  Review of Systems  Constitutional: Negative for fever, chills, diaphoresis, appetite change and fatigue.  HENT: Negative for nosebleeds.   Eyes: Negative for pain.  Respiratory: Negative for cough, choking, chest tightness and wheezing.   Cardiovascular: Negative for leg swelling.  Gastrointestinal: Negative for nausea and constipation.  Genitourinary: Negative for decreased urine volume and genital sores.  Musculoskeletal: Negative for myalgias and back pain.  Skin: Negative for rash.  Neurological: Negative for weakness and light-headedness.  Psychiatric/Behavioral: The patient is not nervous/anxious.      R lower rib cage poster painful on palpation    Objective:   Physical Exam  Constitutional: He is oriented to person, place, and time. He appears well-developed.  HENT:  Mouth/Throat: Oropharynx is clear and moist.  Eyes: Conjunctivae are normal. Pupils are equal, round, and reactive to light.  Neck: Normal range of motion. No JVD present. No thyromegaly present.  Cardiovascular: Normal rate, regular rhythm, normal heart sounds and intact distal pulses.  Exam reveals no gallop and no friction rub.   No murmur heard. Pulmonary/Chest: Effort normal and breath sounds normal. No respiratory distress. He has no wheezes. He has no rales. Tenderness: A couple lower posterior floating ribs are tender to palpation; no crepitus.  Abdominal: Soft. Bowel sounds are normal. He exhibits no distension and no mass. There is no tenderness. There is no rebound and no guarding.  Musculoskeletal: Normal range of motion. He exhibits no edema and no tenderness.  Lymphadenopathy:    He has no cervical adenopathy.  Neurological: He is alert and  oriented to person, place, and time. He has normal reflexes. No cranial nerve deficit. He exhibits normal muscle tone. Coordination normal.  Skin: Skin is warm and dry. No rash noted.  Psychiatric: He has a normal mood and affect. His behavior is normal. Judgment and thought content normal.         L achilles tendon w/rash Assessment & Plan:

## 2010-08-05 NOTE — Assessment & Plan Note (Signed)
Cont Rx 

## 2010-08-18 ENCOUNTER — Encounter: Payer: Self-pay | Admitting: Internal Medicine

## 2010-08-18 NOTE — Assessment & Plan Note (Signed)
On Rx 

## 2010-08-18 NOTE — Assessment & Plan Note (Signed)
Cont Rx 

## 2010-08-26 ENCOUNTER — Ambulatory Visit (INDEPENDENT_AMBULATORY_CARE_PROVIDER_SITE_OTHER): Payer: Medicare Other | Admitting: Internal Medicine

## 2010-08-26 ENCOUNTER — Encounter: Payer: Self-pay | Admitting: Internal Medicine

## 2010-08-26 ENCOUNTER — Ambulatory Visit (INDEPENDENT_AMBULATORY_CARE_PROVIDER_SITE_OTHER)
Admission: RE | Admit: 2010-08-26 | Discharge: 2010-08-26 | Disposition: A | Discharge status: Home / Self Care | Payer: Medicare Other | Source: Ambulatory Visit | Attending: Internal Medicine | Admitting: Internal Medicine

## 2010-08-26 ENCOUNTER — Ambulatory Visit (INDEPENDENT_AMBULATORY_CARE_PROVIDER_SITE_OTHER): Payer: Medicare Other | Admitting: *Deleted

## 2010-08-26 ENCOUNTER — Telehealth: Payer: Self-pay | Admitting: Internal Medicine

## 2010-08-26 DIAGNOSIS — J40 Bronchitis, not specified as acute or chronic: Secondary | ICD-10-CM

## 2010-08-26 DIAGNOSIS — I712 Thoracic aortic aneurysm, without rupture, unspecified: Secondary | ICD-10-CM

## 2010-08-26 DIAGNOSIS — R059 Cough, unspecified: Secondary | ICD-10-CM

## 2010-08-26 DIAGNOSIS — Z7901 Long term (current) use of anticoagulants: Secondary | ICD-10-CM

## 2010-08-26 DIAGNOSIS — R05 Cough: Secondary | ICD-10-CM | POA: Insufficient documentation

## 2010-08-26 DIAGNOSIS — I359 Nonrheumatic aortic valve disorder, unspecified: Secondary | ICD-10-CM

## 2010-08-26 DIAGNOSIS — J310 Chronic rhinitis: Secondary | ICD-10-CM

## 2010-08-26 MED ORDER — BENZONATATE 100 MG PO CAPS: 100.0000 mg | ORAL_CAPSULE | Freq: Three times a day (TID) | ORAL | Status: DC | PRN

## 2010-08-26 MED ORDER — AMOXICILLIN 500 MG PO CAPS: ORAL_CAPSULE | ORAL | Status: DC

## 2010-08-26 MED ORDER — AZITHROMYCIN 250 MG PO TABS: 250.0000 mg | ORAL_TABLET | Freq: Every day | ORAL | Status: AC

## 2010-08-26 MED ORDER — BUDESONIDE-FORMOTEROL FUMARATE 160-4.5 MCG/ACT IN AERO: 2.0000 | INHALATION_SPRAY | Freq: Two times a day (BID) | RESPIRATORY_TRACT | Status: DC

## 2010-08-26 MED ORDER — IPRATROPIUM BROMIDE 0.06 % NA SOLN: 2.0000 | Freq: Three times a day (TID) | NASAL | Status: DC

## 2010-08-26 NOTE — Progress Notes (Signed)
Subjective:    Patient ID: Patrick [name], male    DOB: 06/06/1940, 70 y.o.   MRN: [id number]  HPI  C/o coughing spells x 10 secs x 1 month, dry - day and night, spastic. No fever, no SOB. Worse lately. C/o L nostril running all the time   Review of Systems  Constitutional: Negative for appetite change, fatigue and unexpected weight change.  HENT: Positive for rhinorrhea (left). Negative for nosebleeds, congestion, sore throat, sneezing, trouble swallowing and neck pain.   Eyes: Negative for itching and visual disturbance.  Respiratory: Positive for cough.   Cardiovascular: Negative for chest pain, palpitations and leg swelling.  Gastrointestinal: Negative for nausea, diarrhea, blood in stool and abdominal distention.  Genitourinary: Negative for frequency and hematuria.  Musculoskeletal: Negative for back pain, joint swelling and gait problem.  Skin: Negative for rash.  Neurological: Negative for dizziness, tremors, speech difficulty and weakness.  Psychiatric/Behavioral: Negative for sleep disturbance, dysphoric mood and agitation. The patient is not nervous/anxious.        Objective: Subjective:    Patient ID: Patrick [name], male    DOB: 08/18/1940, 70 y.o.   MRN: [id number]  HPI  C/o R sided rib cage pain that started 4-5 h after playing golf on Sat. Worse with movement. It is better today. He is on Coumadin, statin. F/u HTN  Review of Systems  Constitutional: Negative for fever, chills, diaphoresis, appetite change and fatigue.  HENT: Negative for nosebleeds.   Eyes: Negative for pain.  Respiratory: Negative for cough, choking, chest tightness and wheezing.   Cardiovascular: Negative for leg swelling.  Gastrointestinal: Negative for nausea and constipation.  Genitourinary: Negative for decreased urine volume and genital sores.  Musculoskeletal: Negative for myalgias and back pain.  Skin: Negative for rash.  Neurological: Negative for weakness and light-headedness.    Psychiatric/Behavioral: The patient is not nervous/anxious.      R lower rib cage poster painful on palpation    Objective:   Physical Exam  Constitutional: He is oriented to person, place, and time. He appears well-developed.  HENT:  Mouth/Throat: Oropharynx is clear and moist.  Eyes: Conjunctivae are normal. Pupils are equal, round, and reactive to light.  Neck: Normal range of motion. No JVD present. No thyromegaly present.  Cardiovascular: Normal rate, regular rhythm, normal heart sounds and intact distal pulses.  Exam reveals no gallop and no friction rub.   No murmur heard. Pulmonary/Chest: Effort normal and breath sounds normal. No respiratory distress. He has no wheezes. He has no rales. Tenderness: A couple lower posterior floating ribs are tender to palpation; no crepitus.  Abdominal: Soft. Bowel sounds are normal. He exhibits no distension and no mass. There is no tenderness. There is no rebound and no guarding.  Musculoskeletal: Normal range of motion. He exhibits no edema and no tenderness.  Lymphadenopathy:    He has no cervical adenopathy.  Neurological: He is alert and oriented to person, place, and time. He has normal reflexes. No cranial nerve deficit. He exhibits normal muscle tone. Coordination normal.  Skin: Skin is warm and dry. No rash noted.  Psychiatric: He has a normal mood and affect. His behavior is normal. Judgment and thought content normal.         L achilles tendon w/rash Assessment & Plan:   Subjective:    Patient ID: Patrick [name], male    DOB: 1940/06/17, 70 y.o.   MRN: [id number]  HPI  C/o R sided rib cage pain that started 4-5  h after playing golf on Sat. Worse with movement. It is better today. He is on Coumadin, statin. F/u HTN  Review of Systems  Constitutional: Negative for fever, chills, diaphoresis, appetite change and fatigue.  HENT: Negative for nosebleeds.   Eyes: Negative for pain.  Respiratory: Negative for cough, choking,  chest tightness and wheezing.   Cardiovascular: Negative for leg swelling.  Gastrointestinal: Negative for nausea and constipation.  Genitourinary: Negative for decreased urine volume and genital sores.  Musculoskeletal: Negative for myalgias and back pain.  Skin: Negative for rash.  Neurological: Negative for weakness and light-headedness.  Psychiatric/Behavioral: The patient is not nervous/anxious.      R lower rib cage poster painful on palpation    Objective:   Physical Exam  Constitutional: He is oriented to person, place, and time. He appears well-developed.  HENT:  Mouth/Throat: Oropharynx is clear and moist.  Eyes: Conjunctivae are normal. Pupils are equal, round, and reactive to light.  Neck: Normal range of motion. No JVD present. No thyromegaly present.  Cardiovascular: Normal rate, regular rhythm, normal heart sounds and intact distal pulses.  Exam reveals no gallop and no friction rub.   No murmur heard. Pulmonary/Chest: Effort normal and breath sounds normal. No respiratory distress. He has no wheezes. He has no rales. Tenderness: A couple lower posterior floating ribs are tender to palpation; no crepitus.  Abdominal: Soft. Bowel sounds are normal. He exhibits no distension and no mass. There is no tenderness. There is no rebound and no guarding.  Musculoskeletal: Normal range of motion. He exhibits no edema and no tenderness.  Lymphadenopathy:    He has no cervical adenopathy.  Neurological: He is alert and oriented to person, place, and time. He has normal reflexes. No cranial nerve deficit. He exhibits normal muscle tone. Coordination normal.  Skin: Skin is warm and dry. No rash noted.  Psychiatric: He has a normal mood and affect. His behavior is normal. Judgment and thought content normal.         L achilles tendon w/rash Assessment & Plan:     Physical Exam  Constitutional: He is oriented to person, place, and time. He appears well-developed.  HENT:    Mouth/Throat: Oropharynx is clear and moist.  Eyes: Conjunctivae are normal. Pupils are equal, round, and reactive to light.  Neck: Normal range of motion. No JVD present. No thyromegaly present.  Cardiovascular: Normal rate, regular rhythm, normal heart sounds and intact distal pulses.  Exam reveals no gallop and no friction rub.   No murmur heard.      click  Pulmonary/Chest: Effort normal and breath sounds normal. No respiratory distress. He has no wheezes. He has no rales. He exhibits no tenderness.  Abdominal: Soft. Bowel sounds are normal. He exhibits no distension and no mass. There is no tenderness. There is no rebound and no guarding.  Musculoskeletal: Normal range of motion. He exhibits no edema and no tenderness.  Lymphadenopathy:    He has no cervical adenopathy.  Neurological: He is alert and oriented to person, place, and time. He has normal reflexes. No cranial nerve deficit. He exhibits normal muscle tone. Coordination normal.  Skin: Skin is warm and dry. No rash noted.  Psychiatric: He has a normal mood and affect. His behavior is normal. Judgment and thought content normal.          Assessment & Plan:

## 2010-08-26 NOTE — Assessment & Plan Note (Signed)
Zpac CXR 

## 2010-08-26 NOTE — Telephone Encounter (Signed)
Patrick [name], please, inform patient that CXR is normal Thx

## 2010-08-26 NOTE — Assessment & Plan Note (Signed)
Atrovent nasal 

## 2010-08-26 NOTE — Assessment & Plan Note (Signed)
Symbicort bid See Meds

## 2010-08-26 NOTE — Patient Instructions (Signed)
Call if not better 

## 2010-08-26 NOTE — Assessment & Plan Note (Signed)
Amoxicillin prior to dental work

## 2010-08-27 NOTE — Telephone Encounter (Signed)
Pts wife informed.

## 2010-09-12 ENCOUNTER — Other Ambulatory Visit: Payer: Self-pay | Admitting: Internal Medicine

## 2010-09-13 NOTE — Telephone Encounter (Signed)
Rf phoned in.  

## 2010-09-23 ENCOUNTER — Ambulatory Visit (INDEPENDENT_AMBULATORY_CARE_PROVIDER_SITE_OTHER): Payer: Medicare Other | Admitting: *Deleted

## 2010-09-23 DIAGNOSIS — I712 Thoracic aortic aneurysm, without rupture, unspecified: Secondary | ICD-10-CM

## 2010-09-23 DIAGNOSIS — I359 Nonrheumatic aortic valve disorder, unspecified: Secondary | ICD-10-CM

## 2010-09-23 DIAGNOSIS — Z7901 Long term (current) use of anticoagulants: Secondary | ICD-10-CM

## 2010-10-21 ENCOUNTER — Ambulatory Visit (INDEPENDENT_AMBULATORY_CARE_PROVIDER_SITE_OTHER): Payer: Medicare Other | Admitting: *Deleted

## 2010-10-21 DIAGNOSIS — I712 Thoracic aortic aneurysm, without rupture, unspecified: Secondary | ICD-10-CM

## 2010-10-21 DIAGNOSIS — I359 Nonrheumatic aortic valve disorder, unspecified: Secondary | ICD-10-CM

## 2010-10-21 DIAGNOSIS — Z7901 Long term (current) use of anticoagulants: Secondary | ICD-10-CM

## 2010-10-21 LAB — POCT INR: INR: 2.4

## 2010-11-18 ENCOUNTER — Ambulatory Visit (INDEPENDENT_AMBULATORY_CARE_PROVIDER_SITE_OTHER): Payer: Medicare Other | Admitting: *Deleted

## 2010-11-18 DIAGNOSIS — Z7901 Long term (current) use of anticoagulants: Secondary | ICD-10-CM

## 2010-11-18 DIAGNOSIS — I359 Nonrheumatic aortic valve disorder, unspecified: Secondary | ICD-10-CM

## 2010-11-18 DIAGNOSIS — I712 Thoracic aortic aneurysm, without rupture, unspecified: Secondary | ICD-10-CM

## 2010-11-18 LAB — POCT INR: INR: 3

## 2010-11-21 ENCOUNTER — Other Ambulatory Visit: Payer: Self-pay | Admitting: *Deleted

## 2010-11-21 MED ORDER — ATORVASTATIN CALCIUM 10 MG PO TABS: 10.0000 mg | ORAL_TABLET | Freq: Every day | ORAL | Status: DC

## 2010-12-16 ENCOUNTER — Ambulatory Visit (INDEPENDENT_AMBULATORY_CARE_PROVIDER_SITE_OTHER): Payer: Medicare Other | Admitting: *Deleted

## 2010-12-16 DIAGNOSIS — I712 Thoracic aortic aneurysm, without rupture, unspecified: Secondary | ICD-10-CM

## 2010-12-16 DIAGNOSIS — I359 Nonrheumatic aortic valve disorder, unspecified: Secondary | ICD-10-CM

## 2010-12-16 DIAGNOSIS — Z7901 Long term (current) use of anticoagulants: Secondary | ICD-10-CM

## 2010-12-16 LAB — POCT INR: INR: 1.4

## 2010-12-25 ENCOUNTER — Ambulatory Visit (INDEPENDENT_AMBULATORY_CARE_PROVIDER_SITE_OTHER): Payer: Medicare Other | Admitting: *Deleted

## 2010-12-25 DIAGNOSIS — I712 Thoracic aortic aneurysm, without rupture, unspecified: Secondary | ICD-10-CM

## 2010-12-25 DIAGNOSIS — Z7901 Long term (current) use of anticoagulants: Secondary | ICD-10-CM

## 2010-12-25 DIAGNOSIS — I359 Nonrheumatic aortic valve disorder, unspecified: Secondary | ICD-10-CM

## 2010-12-25 LAB — POCT INR: INR: 1.8

## 2011-01-15 ENCOUNTER — Ambulatory Visit (INDEPENDENT_AMBULATORY_CARE_PROVIDER_SITE_OTHER): Payer: Medicare Other | Admitting: *Deleted

## 2011-01-15 DIAGNOSIS — I359 Nonrheumatic aortic valve disorder, unspecified: Secondary | ICD-10-CM

## 2011-01-15 DIAGNOSIS — I712 Thoracic aortic aneurysm, without rupture, unspecified: Secondary | ICD-10-CM

## 2011-01-15 DIAGNOSIS — Z7901 Long term (current) use of anticoagulants: Secondary | ICD-10-CM

## 2011-01-21 ENCOUNTER — Other Ambulatory Visit: Payer: Self-pay | Admitting: *Deleted

## 2011-01-21 MED ORDER — WARFARIN SODIUM 5 MG PO TABS: 5.0000 mg | ORAL_TABLET | ORAL | Status: DC

## 2011-02-03 ENCOUNTER — Ambulatory Visit (INDEPENDENT_AMBULATORY_CARE_PROVIDER_SITE_OTHER): Payer: Medicare Other | Admitting: *Deleted

## 2011-02-03 DIAGNOSIS — I359 Nonrheumatic aortic valve disorder, unspecified: Secondary | ICD-10-CM

## 2011-02-03 DIAGNOSIS — Z7901 Long term (current) use of anticoagulants: Secondary | ICD-10-CM

## 2011-02-03 DIAGNOSIS — I712 Thoracic aortic aneurysm, without rupture, unspecified: Secondary | ICD-10-CM

## 2011-02-26 ENCOUNTER — Ambulatory Visit (INDEPENDENT_AMBULATORY_CARE_PROVIDER_SITE_OTHER): Payer: Medicare Other | Admitting: *Deleted

## 2011-02-26 ENCOUNTER — Encounter: Payer: Self-pay | Admitting: Cardiology

## 2011-02-26 ENCOUNTER — Ambulatory Visit (INDEPENDENT_AMBULATORY_CARE_PROVIDER_SITE_OTHER): Payer: Medicare Other | Admitting: Cardiology

## 2011-02-26 VITALS — BP 140/72 | HR 45 | Ht 72.0 in | Wt 179.0 lb

## 2011-02-26 DIAGNOSIS — Z7901 Long term (current) use of anticoagulants: Secondary | ICD-10-CM | POA: Diagnosis not present

## 2011-02-26 DIAGNOSIS — I712 Thoracic aortic aneurysm, without rupture, unspecified: Secondary | ICD-10-CM

## 2011-02-26 DIAGNOSIS — Z952 Presence of prosthetic heart valve: Secondary | ICD-10-CM

## 2011-02-26 DIAGNOSIS — I359 Nonrheumatic aortic valve disorder, unspecified: Secondary | ICD-10-CM | POA: Diagnosis not present

## 2011-02-26 DIAGNOSIS — I498 Other specified cardiac arrhythmias: Secondary | ICD-10-CM

## 2011-02-26 NOTE — Assessment & Plan Note (Signed)
He is relatively asymptomatic but will taper off his low-dose atenolol to see if his heart rate will increase and his energy levels will improve.

## 2011-02-26 NOTE — Assessment & Plan Note (Signed)
Status post repair. Stable

## 2011-02-26 NOTE — Patient Instructions (Signed)
Your physician wants you to follow-up in: 1 year.  You will receive a reminder letter in the mail two months in advance. If you don't receive a letter, please call our office to schedule the follow-up appointment.  Your physician has recommended you make the following change in your medication: Change Atenolol dose to 12.5mg  for 1 week then stop taking.

## 2011-02-26 NOTE — Progress Notes (Signed)
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 He is totally asymptomatic with no complaints. He also denies bleeding or melena.  Past Medical History  Diagnosis Date  . Anticoagulant long-term use   . BPH (benign prostatic hyperplasia)     Current Outpatient Prescriptions  Medication Sig Dispense Refill  . amoxicillin (AMOXIL) 500 MG capsule Take 4 caps 1 h prior to dental appointment  20 capsule  0  . atenolol (TENORMIN) 25 MG tablet Take 0.5 tablets (12.5 mg total) by mouth daily.      Marland Kitchen atorvastatin (LIPITOR) 10 MG tablet Take 1 tablet (10 mg total) by mouth daily.  30 tablet  5  . benzonatate (TESSALON PERLES) 100 MG capsule Take 1 capsule (100 mg total) by mouth 3 (three) times daily as needed for cough.  100 capsule  1  . budesonide-formoterol (SYMBICORT) 160-4.5 MCG/ACT inhaler Inhale 2 puffs into the lungs 2 (two) times daily.  1 Inhaler  6  . ciprofloxacin (CIPRO) 500 MG tablet as needed.       . clotrimazole-betamethasone (LOTRISONE) cream Apply topically 2 (two) times daily.  45 g  3  . dutasteride (AVODART) 0.5 MG capsule Take 0.5 mg by mouth daily.        Marland Kitchen ipratropium (ATROVENT) 0.06 % nasal spray Place 2 sprays into the nose 3 (three) times daily.  15 mL  6  . SUMAtriptan (IMITREX) 50 MG tablet Take 25 mg by mouth every 2 (two) hours as needed.       . warfarin (COUMADIN) 5 MG tablet Take 1 tablet (5 mg total) by mouth as directed.  90 tablet  1    Allergies  Allergen Reactions  . Codeine     rash    Family History  Problem Relation Age of Onset  . Hypertension Father   . Hyperlipidemia Father     History   Social History  . Marital Status: Married    Spouse Name: N/A    Number of Children: N/A  . Years of Education: N/A   Occupational History  . Not on file.   Social History Main Topics  . Smoking status: Former Games developer  . Smokeless tobacco: Not on file  . Alcohol Use: Yes  . Drug Use: No  . Sexually  Active: Yes   Other Topics Concern  . Not on file   Social History Narrative  . No narrative on file    ROS ALL NEGATIVE EXCEPT THOSE NOTED IN HPI  PE  General Appearance: well developed, well nourished in no acute distress HEENT: symmetrical face, PERRLA, good dentition  Neck: no JVD, thyromegaly, or adenopathy, trachea midline Chest: symmetric without deformity Cardiac: PMI non-displaced, RRR, normal S1,prosthetic S2 no gallop or murmur Lung: clear to ausculation and percussion Vascular: all pulses full without bruits  Abdominal: nondistended, nontender, good bowel sounds, no HSM, no bruits Extremities: no cyanosis, clubbing or edema, no sign of DVT, no varicosities  Skin: normal color, no rashes Neuro: alert and oriented x 3, non-focal Pysch: normal affect  EKG Sinus bradycardia, rate 45 first degree AV block BMET    Component Value Date/Time   NA 140 09/18/2009 1116   K 5.4* 09/18/2009 1116   CL 104 09/18/2009 1116   CO2 35* 09/18/2009 1116   GLUCOSE 93 09/18/2009 1116   GLUCOSE 96 01/12/2006 1138   BUN 15 09/18/2009 1116   CREATININE 1.3 09/18/2009 1116   CALCIUM 10.1 09/18/2009 1116   GFRNONAA  59.55 09/18/2009 1116   GFRAA 78 03/01/2008 1428    Lipid Panel     Component Value Date/Time   CHOL 153 09/13/2009 0811   TRIG 99.0 09/13/2009 0811   HDL 48.30 09/13/2009 0811   CHOLHDL 3 09/13/2009 0811   VLDL 19.8 09/13/2009 0811   LDLCALC 85 09/13/2009 0811    CBC    Component Value Date/Time   WBC 7.1 09/18/2009 1116   RBC 4.86 09/18/2009 1116   HGB 16.0 09/18/2009 1116   HCT 46.2 09/18/2009 1116   PLT 177.0 09/18/2009 1116   MCV 95.2 09/18/2009 1116   MCHC 34.6 09/18/2009 1116   RDW 13.2 09/18/2009 1116   LYMPHSABS 1.1 09/18/2009 1116   MONOABS 0.8 09/18/2009 1116   EOSABS 0.1 09/18/2009 1116   BASOSABS 0.0 09/18/2009 1116

## 2011-03-26 ENCOUNTER — Ambulatory Visit (INDEPENDENT_AMBULATORY_CARE_PROVIDER_SITE_OTHER): Payer: Medicare Other | Admitting: Endocrinology

## 2011-03-26 ENCOUNTER — Other Ambulatory Visit (INDEPENDENT_AMBULATORY_CARE_PROVIDER_SITE_OTHER): Payer: Medicare Other

## 2011-03-26 ENCOUNTER — Encounter: Payer: Self-pay | Admitting: Endocrinology

## 2011-03-26 DIAGNOSIS — Z7901 Long term (current) use of anticoagulants: Secondary | ICD-10-CM | POA: Insufficient documentation

## 2011-03-26 DIAGNOSIS — R972 Elevated prostate specific antigen [PSA]: Secondary | ICD-10-CM | POA: Diagnosis not present

## 2011-03-26 DIAGNOSIS — Z79899 Other long term (current) drug therapy: Secondary | ICD-10-CM

## 2011-03-26 DIAGNOSIS — I10 Essential (primary) hypertension: Secondary | ICD-10-CM

## 2011-03-26 DIAGNOSIS — E785 Hyperlipidemia, unspecified: Secondary | ICD-10-CM | POA: Diagnosis not present

## 2011-03-26 LAB — CBC WITH DIFFERENTIAL/PLATELET
Basophils Absolute: 0 10*3/uL (ref 0.0–0.1)
Eosinophils Relative: 1.8 % (ref 0.0–5.0)
HCT: 47.9 % (ref 39.0–52.0)
Lymphocytes Relative: 7.9 % — ABNORMAL LOW (ref 12.0–46.0)
Lymphs Abs: 0.6 10*3/uL — ABNORMAL LOW (ref 0.7–4.0)
Monocytes Relative: 10.9 % (ref 3.0–12.0)
Platelets: 158 10*3/uL (ref 150.0–400.0)
WBC: 8 10*3/uL (ref 4.5–10.5)

## 2011-03-26 LAB — URINALYSIS, ROUTINE W REFLEX MICROSCOPIC
Hgb urine dipstick: NEGATIVE
Ketones, ur: NEGATIVE
Specific Gravity, Urine: >=1.03 (ref 1.000–1.030)
Total Protein, Urine: NEGATIVE
Urine Glucose: NEGATIVE
Urobilinogen, UA: 0.2 (ref 0.0–1.0)

## 2011-03-26 LAB — BASIC METABOLIC PANEL
Chloride: 104 mEq/L (ref 96–112)
Potassium: 4.3 mEq/L (ref 3.5–5.1)
Sodium: 141 mEq/L (ref 135–145)

## 2011-03-26 LAB — LIPID PANEL
Cholesterol: 144 mg/dL (ref 0–200)
LDL Cholesterol: 78 mg/dL (ref 0–99)
Triglycerides: 87 mg/dL (ref 0.0–149.0)
VLDL: 17.4 mg/dL (ref 0.0–40.0)

## 2011-03-26 LAB — HEPATIC FUNCTION PANEL
ALT: 30 U/L (ref 0–53)
AST: 38 U/L — ABNORMAL HIGH (ref 0–37)
Alkaline Phosphatase: 69 U/L (ref 39–117)
Bilirubin, Direct: 0.2 mg/dL (ref 0.0–0.3)
Total Bilirubin: 1.3 mg/dL — ABNORMAL HIGH (ref 0.3–1.2)

## 2011-03-26 MED ORDER — CEFUROXIME AXETIL 250 MG PO TABS: 250.0000 mg | ORAL_TABLET | Freq: Two times a day (BID) | ORAL | Status: AC

## 2011-03-26 MED ORDER — PROMETHAZINE-DM 6.25-15 MG/5ML PO SYRP: 5.0000 mL | ORAL_SOLUTION | Freq: Four times a day (QID) | ORAL | Status: AC | PRN

## 2011-03-26 NOTE — Progress Notes (Signed)
Subjective:    Patient ID: Patrick [name], male    DOB: 10/25/1940, 71 y.o.   MRN: [id number]  HPI Pt states 1 week of moderate prod-quality cough in the chest, and slight assoc wheezing.   He recently returned from [place].   He has 8 mos of bilat moderate shoulder pain, in the context of rom.   Past Medical History  Diagnosis Date  . Anticoagulant long-term use   . BPH (benign prostatic hyperplasia)     Past Surgical History  Procedure Date  . Cardiac valve replacement     AVR    History   Social History  . Marital Status: Married    Spouse Name: N/A    Number of Children: N/A  . Years of Education: N/A   Occupational History  . Not on file.   Social History Main Topics  . Smoking status: Former Games developer  . Smokeless tobacco: Not on file  . Alcohol Use: Yes  . Drug Use: No  . Sexually Active: Yes   Other Topics Concern  . Not on file   Social History Narrative  . No narrative on file    Current Outpatient Prescriptions on File Prior to Visit  Medication Sig Dispense Refill  . atorvastatin (LIPITOR) 10 MG tablet Take 1 tablet (10 mg total) by mouth daily.  30 tablet  5  . ciprofloxacin (CIPRO) 500 MG tablet as needed.       . clotrimazole-betamethasone (LOTRISONE) cream Apply topically 2 (two) times daily.  45 g  3  . dutasteride (AVODART) 0.5 MG capsule Take 0.5 mg by mouth daily.        Marland Kitchen ipratropium (ATROVENT) 0.06 % nasal spray Place 2 sprays into the nose 3 (three) times daily.  15 mL  6  . SUMAtriptan (IMITREX) 50 MG tablet Take 25 mg by mouth every 2 (two) hours as needed.       . warfarin (COUMADIN) 5 MG tablet Take 1 tablet (5 mg total) by mouth as directed.  90 tablet  1  . amoxicillin (AMOXIL) 500 MG capsule Take 4 caps 1 h prior to dental appointment  20 capsule  0    Allergies  Allergen Reactions  . Codeine     rash    Family History  Problem Relation Age of Onset  . Hypertension Father   . Hyperlipidemia Father     BP 140/88  Pulse  71  Temp(Src) 97.2 F (36.2 C) (Oral)  Wt 179 lb (81.194 kg)  SpO2 95%  Review of Systems Denies fever, but he has slight sob    Objective:   Physical Exam VITAL SIGNS:  See vs page GENERAL: no distress head: no deformity eyes: no periorbital swelling, no proptosis external nose and ears are normal mouth: no lesion seen Both eac's and tm's are slightly red NECK: There is no palpable thyroid enlargement.  No thyroid nodule is palpable.  No palpable lymphadenopathy at the anterior neck. LUNGS:  Clear to auscultation MSK:  Abduction of both shoulders is limited to 90 degrees, by pain   Lab Results  Component Value Date   WBC 8.0 03/26/2011   HGB 16.6 03/26/2011   HCT 47.9 03/26/2011   PLT 158.0 03/26/2011   GLUCOSE 126* 03/26/2011   CHOL 144 03/26/2011   TRIG 87.0 03/26/2011   HDL 48.90 03/26/2011   LDLCALC 78 03/26/2011   ALT 30 03/26/2011   AST 38* 03/26/2011   NA 141 03/26/2011   K 4.3 03/26/2011  CL 104 03/26/2011   CREATININE 1.1 03/26/2011   BUN 14 03/26/2011   CO2 32 03/26/2011   TSH 0.54 03/26/2011   PSA 2.27 09/18/2009   INR 2.3 03/28/2011   HGBA1C 5.4 03/05/2009      Assessment & Plan:  Acute bronchitis, new.  He declines mdi Htn, with ? Of situational component Shoulder pain, uncertain etiology elev ast, prob due to viral illness

## 2011-03-26 NOTE — Patient Instructions (Addendum)
i have sent 2 prescriptions to your pharmacy:  antibiotic, and cough syrup.  I hope you feel better soon.  If you don't feel better by next week, please call back.    Please schedule a regular physical with dr plotnikov.   blood tests are being requested for you today.  please call [phone] to hear your test results.  You will be prompted to enter the 9-digit "MRN" number that appears at the top left of this page, followed by #.  Then you will hear the message. Call if you want any of these for your shoulder pain:  X-ray, pain medication, or orthopedic doctor. (update: i left message on phone-tree:  You should have liver panel rechecked when you see dr Macario Golds.

## 2011-03-28 ENCOUNTER — Ambulatory Visit (INDEPENDENT_AMBULATORY_CARE_PROVIDER_SITE_OTHER): Payer: Medicare Other

## 2011-03-28 DIAGNOSIS — I712 Thoracic aortic aneurysm, without rupture, unspecified: Secondary | ICD-10-CM | POA: Diagnosis not present

## 2011-03-28 DIAGNOSIS — I359 Nonrheumatic aortic valve disorder, unspecified: Secondary | ICD-10-CM | POA: Diagnosis not present

## 2011-03-28 DIAGNOSIS — Z7901 Long term (current) use of anticoagulants: Secondary | ICD-10-CM

## 2011-03-28 LAB — POCT INR: INR: 2.3

## 2011-04-07 ENCOUNTER — Telehealth: Payer: Self-pay | Admitting: *Deleted

## 2011-04-07 ENCOUNTER — Other Ambulatory Visit: Payer: Self-pay | Admitting: *Deleted

## 2011-04-07 NOTE — Telephone Encounter (Signed)
OK to fill this prescription with additional refills x1 year Thank you!  

## 2011-04-07 NOTE — Telephone Encounter (Signed)
Rf req for warfarin 5 mg is written as 1 po qd as directed. Please advise - pt states he takes 1.5 on some days. Is this correct, if so what should refill quantity be? #90, 135?

## 2011-04-08 ENCOUNTER — Other Ambulatory Visit: Payer: Self-pay | Admitting: Internal Medicine

## 2011-04-08 MED ORDER — WARFARIN SODIUM 5 MG PO TABS: 5.0000 mg | ORAL_TABLET | ORAL | Status: DC

## 2011-04-09 ENCOUNTER — Telehealth: Payer: Self-pay | Admitting: *Deleted

## 2011-04-09 MED ORDER — CIPROFLOXACIN HCL 500 MG PO TABS: 500.0000 mg | ORAL_TABLET | Freq: Two times a day (BID) | ORAL | Status: DC

## 2011-04-09 NOTE — Telephone Encounter (Signed)
Requested Medications     ciprofloxacin (CIPRO) 500 MG tablet [Pharmacy Med Name: CIPROFLOXACIN HCL 500 MG TAB]    take 1 tablet by mouth twice a day for 14 days    Disp: 28 tablet R: 1 Start: 04/08/2011 Class: Normal    Requested on: 08/05/2010    Originally ordered on: 08/05/2010 Last refill: 12/31/2010 Order History and Details

## 2011-04-09 NOTE — Telephone Encounter (Signed)
OK to fill this prescription with additional refills x1 Thank you!  

## 2011-05-09 ENCOUNTER — Ambulatory Visit (INDEPENDENT_AMBULATORY_CARE_PROVIDER_SITE_OTHER): Payer: Medicare Other | Admitting: *Deleted

## 2011-05-09 DIAGNOSIS — I712 Thoracic aortic aneurysm, without rupture, unspecified: Secondary | ICD-10-CM

## 2011-05-09 DIAGNOSIS — Z7901 Long term (current) use of anticoagulants: Secondary | ICD-10-CM

## 2011-05-09 DIAGNOSIS — I359 Nonrheumatic aortic valve disorder, unspecified: Secondary | ICD-10-CM

## 2011-05-27 ENCOUNTER — Encounter: Payer: Self-pay | Admitting: Internal Medicine

## 2011-05-27 ENCOUNTER — Ambulatory Visit (INDEPENDENT_AMBULATORY_CARE_PROVIDER_SITE_OTHER): Payer: Medicare Other | Admitting: Internal Medicine

## 2011-05-27 VITALS — BP 170/90 | HR 84 | Temp 98.2°F | Resp 16 | Wt 179.0 lb

## 2011-05-27 DIAGNOSIS — I10 Essential (primary) hypertension: Secondary | ICD-10-CM | POA: Diagnosis not present

## 2011-05-27 DIAGNOSIS — K5792 Diverticulitis of intestine, part unspecified, without perforation or abscess without bleeding: Secondary | ICD-10-CM

## 2011-05-27 DIAGNOSIS — K5732 Diverticulitis of large intestine without perforation or abscess without bleeding: Secondary | ICD-10-CM

## 2011-05-27 DIAGNOSIS — R05 Cough: Secondary | ICD-10-CM

## 2011-05-27 DIAGNOSIS — R059 Cough, unspecified: Secondary | ICD-10-CM

## 2011-05-27 DIAGNOSIS — G43909 Migraine, unspecified, not intractable, without status migrainosus: Secondary | ICD-10-CM

## 2011-05-27 MED ORDER — DESONIDE 0.05 % EX CREA: 1.0000 "application " | TOPICAL_CREAM | Freq: Every day | CUTANEOUS | Status: DC | PRN

## 2011-05-27 MED ORDER — AMOXICILLIN 500 MG PO CAPS: ORAL_CAPSULE | ORAL | Status: DC

## 2011-05-27 MED ORDER — PSEUDOEPHEDRINE HCL ER 120 MG PO TB12: 120.0000 mg | ORAL_TABLET | Freq: Two times a day (BID) | ORAL | Status: DC | PRN

## 2011-05-27 MED ORDER — CEFUROXIME AXETIL 500 MG PO TABS: ORAL_TABLET | ORAL | Status: DC

## 2011-05-27 MED ORDER — ATENOLOL 25 MG PO TABS: 25.0000 mg | ORAL_TABLET | Freq: Every day | ORAL | Status: DC

## 2011-05-27 MED ORDER — BECLOMETHASONE DIPROPIONATE 80 MCG/ACT IN AERS: 2.0000 | INHALATION_SPRAY | RESPIRATORY_TRACT | Status: DC | PRN

## 2011-05-27 MED ORDER — PROMETHAZINE-CODEINE 6.25-10 MG/5ML PO SYRP: 5.0000 mL | ORAL_SOLUTION | ORAL | Status: DC | PRN

## 2011-05-27 MED ORDER — HYDROCOD POLST-CPM POLST ER 10-8 MG PO CP12: 1.0000 | ORAL_CAPSULE | Freq: Two times a day (BID) | ORAL | Status: DC | PRN

## 2011-05-27 MED ORDER — CIPROFLOXACIN HCL 500 MG PO TABS: 500.0000 mg | ORAL_TABLET | Freq: Two times a day (BID) | ORAL | Status: DC

## 2011-05-27 NOTE — Assessment & Plan Note (Signed)
Qvar x 2-3 wks Tussiocaps prn

## 2011-05-27 NOTE — Assessment & Plan Note (Signed)
Cipro Low res diet x 10 d

## 2011-05-27 NOTE — Assessment & Plan Note (Signed)
Worse - restart Atenolol

## 2011-05-27 NOTE — Patient Instructions (Signed)
Use over-the-counter  "cold" medicines  such as "Tylenol cold" , "Advil cold",  "Mucinex" or" Mucinex D"  for cough and congestion.   Avoid decongestants if you have high blood pressure and use "Afrin" nasal spray for nasal congestion as directed instead. Use" Delsym" or" Robitussin" cough syrup varietis for cough.  You can use plain "Tylenol" or "Advil" for fever, chills and achyness.  

## 2011-05-27 NOTE — Assessment & Plan Note (Signed)
Worse off atenolol - will restart

## 2011-05-27 NOTE — Progress Notes (Signed)
Patient ID: Patrick [name], male   DOB: 1940/09/30, 71 y.o.   MRN: [id number]  Subjective:    Patient ID: Patrick [name], male    DOB: 1940/07/13, 71 y.o.   MRN: [id number]  Cough Associated symptoms include rhinorrhea (left). Pertinent negatives include no chest pain, rash or sore throat.  Abdominal Pain Pertinent negatives include no diarrhea, frequency, hematuria or nausea.    C/o another bout of coughing spells x 10 secs x 1 month, dry - day and night, spastic. No fever, no SOB. Worse lately. C/o nose running all the time C/o abd pain in LLQ - took Cipro - 5 d much better  BP Readings from Last 3 Encounters:  05/27/11 170/90  03/26/11 140/88  02/26/11 140/72   Wt Readings from Last 3 Encounters:  05/27/11 179 lb (81.194 kg)  03/26/11 179 lb (81.194 kg)  02/26/11 179 lb (81.194 kg)      Review of Systems  Constitutional: Negative for appetite change, fatigue and unexpected weight change.  HENT: Positive for rhinorrhea (left). Negative for nosebleeds, congestion, sore throat, sneezing, trouble swallowing and neck pain.   Eyes: Negative for itching and visual disturbance.  Respiratory: Positive for cough.   Cardiovascular: Negative for chest pain, palpitations and leg swelling.  Gastrointestinal: Positive for abdominal pain. Negative for nausea, diarrhea, blood in stool and abdominal distention.  Genitourinary: Negative for frequency and hematuria.  Musculoskeletal: Negative for back pain, joint swelling and gait problem.  Skin: Negative for rash.  Neurological: Negative for dizziness, tremors, speech difficulty and weakness.  Psychiatric/Behavioral: Negative for sleep disturbance, dysphoric mood and agitation. The patient is not nervous/anxious.        Objective: Subjective:    Patient ID: Patrick [name], male    DOB: June 10, 1940, 71 y.o.   MRN: [id number]  HPI  C/o R sided rib cage pain that started 4-5 h after playing golf on Sat. Worse with movement. It is better  today. He is on Coumadin, statin. F/u HTN  Review of Systems  Constitutional: Negative for fever, chills, diaphoresis, appetite change and fatigue.  HENT: Negative for nosebleeds.   Eyes: Negative for pain.  Respiratory: Negative for cough, choking, chest tightness and wheezing.   Cardiovascular: Negative for leg swelling.  Gastrointestinal: Negative for nausea and constipation.  Genitourinary: Negative for decreased urine volume and genital sores.  Musculoskeletal: Negative for myalgias and back pain.  Skin: Negative for rash.  Neurological: Negative for weakness and light-headedness.  Psychiatric/Behavioral: The patient is not nervous/anxious.      R lower rib cage poster painful on palpation    Objective:   Physical Exam  Constitutional: He is oriented to person, place, and time. He appears well-developed.  HENT:  Mouth/Throat: Oropharynx is clear and moist.  Eyes: Conjunctivae are normal. Pupils are equal, round, and reactive to light.  Neck: Normal range of motion. No JVD present. No thyromegaly present.  Cardiovascular: Normal rate, regular rhythm, normal heart sounds and intact distal pulses.  Exam reveals no gallop and no friction rub.   No murmur heard. Pulmonary/Chest: Effort normal and breath sounds normal. No respiratory distress. He has no wheezes. He has no rales. Tenderness: A couple lower posterior floating ribs are tender to palpation; no crepitus.  Abdominal: Soft. Bowel sounds are normal. He exhibits no distension and no mass. There is no tenderness. There is no rebound and no guarding.  Musculoskeletal: Normal range of motion. He exhibits no edema and no tenderness.  Lymphadenopathy:    He  has no cervical adenopathy.  Neurological: He is alert and oriented to person, place, and time. He has normal reflexes. No cranial nerve deficit. He exhibits normal muscle tone. Coordination normal.  Skin: Skin is warm and dry. No rash noted.  Psychiatric: He has a normal  mood and affect. His behavior is normal. Judgment and thought content normal.         L achilles tendon w/rash Assessment & Plan:   Subjective:    Patient ID: Patrick [name], male    DOB: 1940-07-06, 71 y.o.   MRN: [id number]  HPI  C/o R sided rib cage pain that started 4-5 h after playing golf on Sat. Worse with movement. It is better today. He is on Coumadin, statin. F/u HTN  Review of Systems  Constitutional: Negative for fever, chills, diaphoresis, appetite change and fatigue.  HENT: Negative for nosebleeds.   Eyes: Negative for pain.  Respiratory: Negative for cough, choking, chest tightness and wheezing.   Cardiovascular: Negative for leg swelling.  Gastrointestinal: Negative for nausea and constipation.  Genitourinary: Negative for decreased urine volume and genital sores.  Musculoskeletal: Negative for myalgias and back pain.  Skin: Negative for rash.  Neurological: Negative for weakness and light-headedness.  Psychiatric/Behavioral: The patient is not nervous/anxious.      R lower rib cage poster painful on palpation    Objective:   Physical Exam  Constitutional: He is oriented to person, place, and time. He appears well-developed.  HENT:  Mouth/Throat: Oropharynx is clear and moist.  Eyes: Conjunctivae are normal. Pupils are equal, round, and reactive to light.  Neck: Normal range of motion. No JVD present. No thyromegaly present.  Cardiovascular: Normal rate, regular rhythm, normal heart sounds and intact distal pulses.  Exam reveals no gallop and no friction rub.   No murmur heard. Pulmonary/Chest: Effort normal and breath sounds normal. No respiratory distress. He has no wheezes. He has no rales. Tenderness: A couple lower posterior floating ribs are tender to palpation; no crepitus.  Abdominal: Soft. Bowel sounds are normal. He exhibits no distension and no mass. There is no tenderness. There is no rebound and no guarding.  Musculoskeletal: Normal range of  motion. He exhibits no edema and no tenderness.  Lymphadenopathy:    He has no cervical adenopathy.  Neurological: He is alert and oriented to person, place, and time. He has normal reflexes. No cranial nerve deficit. He exhibits normal muscle tone. Coordination normal.  Skin: Skin is warm and dry. No rash noted.  Psychiatric: He has a normal mood and affect. His behavior is normal. Judgment and thought content normal.         L achilles tendon w/rash Assessment & Plan:     Physical Exam  Constitutional: He is oriented to person, place, and time. He appears well-developed.  HENT:  Mouth/Throat: Oropharynx is clear and moist.       Swollen nasal mucosa  Eyes: Conjunctivae are normal. Pupils are equal, round, and reactive to light.  Neck: Normal range of motion. No JVD present. No thyromegaly present.  Cardiovascular: Normal rate, regular rhythm, normal heart sounds and intact distal pulses.  Exam reveals no gallop and no friction rub.   No murmur heard.      click  Pulmonary/Chest: Effort normal and breath sounds normal. No respiratory distress. He has no wheezes. He has no rales. He exhibits no tenderness.  Abdominal: Soft. Bowel sounds are normal. He exhibits no distension and no mass. There is no tenderness. There is  no rebound and no guarding.  Musculoskeletal: Normal range of motion. He exhibits no edema and no tenderness.  Lymphadenopathy:    He has no cervical adenopathy.  Neurological: He is alert and oriented to person, place, and time. He has normal reflexes. No cranial nerve deficit. He exhibits normal muscle tone. Coordination normal.  Skin: Skin is warm and dry. No rash noted.  Psychiatric: He has a normal mood and affect. His behavior is normal. Judgment and thought content normal.          Assessment & Plan:

## 2011-05-29 ENCOUNTER — Other Ambulatory Visit: Payer: Self-pay | Admitting: *Deleted

## 2011-05-29 MED ORDER — TRIAMCINOLONE ACETONIDE 0.1 % EX CREA: 1.0000 "application " | TOPICAL_CREAM | Freq: Two times a day (BID) | CUTANEOUS | Status: DC

## 2011-05-29 NOTE — Telephone Encounter (Signed)
R'cd fax from [facility] for refill of Triamcinolone cream

## 2011-06-20 ENCOUNTER — Ambulatory Visit (INDEPENDENT_AMBULATORY_CARE_PROVIDER_SITE_OTHER): Payer: Medicare Other | Admitting: Pharmacist

## 2011-06-20 DIAGNOSIS — I712 Thoracic aortic aneurysm, without rupture, unspecified: Secondary | ICD-10-CM

## 2011-06-20 DIAGNOSIS — I359 Nonrheumatic aortic valve disorder, unspecified: Secondary | ICD-10-CM

## 2011-06-20 DIAGNOSIS — Z7901 Long term (current) use of anticoagulants: Secondary | ICD-10-CM | POA: Diagnosis not present

## 2011-06-24 ENCOUNTER — Other Ambulatory Visit: Payer: Self-pay | Admitting: Internal Medicine

## 2011-08-01 ENCOUNTER — Ambulatory Visit (INDEPENDENT_AMBULATORY_CARE_PROVIDER_SITE_OTHER): Payer: Medicare Other | Admitting: *Deleted

## 2011-08-01 DIAGNOSIS — Z7901 Long term (current) use of anticoagulants: Secondary | ICD-10-CM

## 2011-08-01 DIAGNOSIS — I359 Nonrheumatic aortic valve disorder, unspecified: Secondary | ICD-10-CM

## 2011-08-01 DIAGNOSIS — I712 Thoracic aortic aneurysm, without rupture, unspecified: Secondary | ICD-10-CM

## 2011-08-15 ENCOUNTER — Other Ambulatory Visit: Payer: Self-pay | Admitting: *Deleted

## 2011-08-15 MED ORDER — SUMATRIPTAN SUCCINATE 50 MG PO TABS: 25.0000 mg | ORAL_TABLET | ORAL | Status: DC | PRN

## 2011-09-12 ENCOUNTER — Ambulatory Visit (INDEPENDENT_AMBULATORY_CARE_PROVIDER_SITE_OTHER): Payer: Medicare Other | Admitting: *Deleted

## 2011-09-12 DIAGNOSIS — I712 Thoracic aortic aneurysm, without rupture, unspecified: Secondary | ICD-10-CM

## 2011-09-12 DIAGNOSIS — Z7901 Long term (current) use of anticoagulants: Secondary | ICD-10-CM | POA: Diagnosis not present

## 2011-09-12 DIAGNOSIS — I359 Nonrheumatic aortic valve disorder, unspecified: Secondary | ICD-10-CM | POA: Diagnosis not present

## 2011-09-22 ENCOUNTER — Telehealth: Payer: Self-pay | Admitting: Internal Medicine

## 2011-09-22 ENCOUNTER — Ambulatory Visit (INDEPENDENT_AMBULATORY_CARE_PROVIDER_SITE_OTHER): Payer: Medicare Other | Admitting: Physician Assistant

## 2011-09-22 ENCOUNTER — Telehealth: Payer: Self-pay | Admitting: Cardiology

## 2011-09-22 ENCOUNTER — Encounter: Payer: Self-pay | Admitting: Physician Assistant

## 2011-09-22 VITALS — BP 166/72 | HR 41 | Ht 72.0 in | Wt 177.0 lb

## 2011-09-22 DIAGNOSIS — Z952 Presence of prosthetic heart valve: Secondary | ICD-10-CM

## 2011-09-22 DIAGNOSIS — Z954 Presence of other heart-valve replacement: Secondary | ICD-10-CM | POA: Diagnosis not present

## 2011-09-22 DIAGNOSIS — I498 Other specified cardiac arrhythmias: Secondary | ICD-10-CM

## 2011-09-22 DIAGNOSIS — G43909 Migraine, unspecified, not intractable, without status migrainosus: Secondary | ICD-10-CM | POA: Diagnosis not present

## 2011-09-22 DIAGNOSIS — I10 Essential (primary) hypertension: Secondary | ICD-10-CM

## 2011-09-22 MED ORDER — DIPHENOXYLATE-ATROPINE 2.5-0.025 MG PO TABS: ORAL_TABLET | ORAL | Status: DC

## 2011-09-22 MED ORDER — ATENOLOL 25 MG PO TABS: ORAL_TABLET | ORAL | Status: DC

## 2011-09-22 MED ORDER — AMLODIPINE BESYLATE 5 MG PO TABS: 5.0000 mg | ORAL_TABLET | Freq: Every day | ORAL | Status: DC

## 2011-09-22 NOTE — Telephone Encounter (Signed)
Patient seen in office today. See encounter note. Tereso Newcomer, PA-C  6:07 PM 09/22/2011

## 2011-09-22 NOTE — Telephone Encounter (Signed)
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

## 2011-09-22 NOTE — Patient Instructions (Addendum)
Your physician has recommended you make the following change in your medication: START NORVASC 5 MG DAILY;  DECREASE ATENOLOL TO 1/2 TABLET DAILY X 2 WEEKS THEN STOP PER SCOTT WEAVER, [facility]  Your physician recommends that you schedule a follow-up appointment in: 11/03/11 @ 8:30 am with Tereso Newcomer, PAC  LOMOTIL 1-2 TABS EVERY 8 HOURS AS NEEDED

## 2011-09-22 NOTE — Telephone Encounter (Signed)
Caller: Patrick [name]; PCP: Sonda Primes; CB#: [phone];  Call regarding Migraine With BP of 150/110 and Heart Rate of 39. Vision changes.   Wants appointment with PCP.  Other emergent sx ruled out.  Advised ED per HTN, Dx or Suspected protocol; Caller repeatedly protested; reinforced need to go to ED immediately and that if sx increase he needs to call 911. Caller states plan to call his cardiologist.

## 2011-09-22 NOTE — Telephone Encounter (Signed)
Pt calls today with increasing blood pressure. His atenolol was restarted by his pcp around early March. He is asymptomatic at this time. He denies Migraine this afternoon. He did have one this morning ( hx chronic migraines)  Pt  called his pcp office and spoke with Amy who according to pt was told to go to the ED. Pt called our office. Pt felt he did NOT need to go to the ED.  BP this morning after Atenolol : 190/110  Hr 39  And 185/106   Hr 36   With migraine  He did not take his Imitrex. He did feel "woozy"  This afternoon:  Blood pressure: 170/101  Hr 41-42.     NO migraine. Asymptomatic. Concerned about blood pressure.  He is also on Coumadin.  Appt for today at 4pm offered to pt after talking with both Lorin Picket and Okey Regal. Pt accepted appointment.  Mylo Red RN

## 2011-09-22 NOTE — Telephone Encounter (Signed)
Patient request return call concerning BP, he can be reached at [phone]

## 2011-09-22 NOTE — Progress Notes (Signed)
[address] [place], [place]  [address] Phone: [phone] Fax:  [phone]  Date:  09/22/2011   Name:  Patrick [name]   DOB:  [birth year]   MRN:  [id number]  PCP:  Sonda Primes, MD  Primary Cardiologist:  Dr. Valera Castle  Primary Electrophysiologist:  None    History of Present Illness: Patrick [name] is a 71 y.o. male who returns for evaluation of BP.    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  [facility] in 2003 demonstrated normal coronary arteries.  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   Last echo 02/2009: EF 55-60%, Normal appearing "tissue valve" with trivial periprosthetic AR, mild MR, mild LAE.  Last seen by Dr. Daleen Squibb 02/2011.  He was bradycardic and his beta blocker titrated down and d/c'd.  Patient had his atenolol restarted by his PCP in April due to worsening migraines.  He has classic migraines with aura.  Patient called his PCP today with elevated blood pressures and migraine headache.  He was advised to go to the emergency room.  He then called our office and was added onto my schedule.  Patient notes visual aura this am.  BP was 190/110.  He has not had his headache begin yet.  BP is better in our office.  No current headache.  No chest pain, dyspnea, syncope, orthopnea, PND, edema.  No facial droop, unilateral weakness, amaurosis fugax.    Potassium  Date/Time Value Range Status  03/26/2011 11:26 AM 4.3  3.5 - 5.1 mEq/L Final     Creatinine, Ser  Date/Time Value Range Status  03/26/2011 11:26 AM 1.1  0.4 - 1.5 mg/dL Final    Past Medical History  Diagnosis Date  . Anticoagulant long-term use   . BPH (benign prostatic hyperplasia)   . 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   Last echo 02/2009: EF 55-60%, Normal appearing "tissue valve" with trivial  periprosthetic AR, mild MR, mild LAE  . HTN (hypertension)   . Migraine headache   . S/P cardiac cath     [facility] in 2003 demonstrated normal coronary arteries    Current Outpatient Prescriptions  Medication Sig Dispense Refill  . amoxicillin (AMOXIL) 500 MG capsule Take 4 caps 1 h prior to dental appointment  20 capsule  0  . atenolol (TENORMIN) 25 MG tablet Take 1 tablet (25 mg total) by mouth daily.  90 tablet  3  . atorvastatin (LIPITOR) 10 MG tablet take 1 tablet by mouth once daily  30 tablet  6  . beclomethasone (QVAR) 80 MCG/ACT inhaler Inhale 2 puffs into the lungs as needed.  1 Inhaler  3  . ciprofloxacin (CIPRO) 500 MG tablet Take 1 tablet (500 mg total) by mouth 2 (two) times daily.  28 tablet  1  . desonide (DESOWEN) 0.05 % cream Apply 1 application topically daily as needed.  60 g  3  . dutasteride (AVODART) 0.5 MG capsule Take 0.5 mg by mouth daily.        Marland Kitchen ipratropium (ATROVENT) 0.06 % nasal spray Place 2 sprays into the nose 3 (three) times daily.  15 mL  6  . SUMAtriptan (IMITREX) 50 MG tablet Take 0.5 tablets (25 mg total) by mouth every 2 (two) hours as needed.  10 tablet  5  .  triamcinolone cream (KENALOG) 0.1 % Apply 1 application topically 2 (two) times daily.  30 g  3  . warfarin (COUMADIN) 5 MG tablet Take 1 tablet (5 mg total) by mouth as directed.  135 tablet  3    Allergies: Allergies  Allergen Reactions  . Codeine     rash    History  Substance Use Topics  . Smoking status: Former Games developer  . Smokeless tobacco: Not on file  . Alcohol Use: Yes     ROS:  Please see the history of present illness.     All other systems reviewed and negative.   PHYSICAL EXAM: VS:  BP 166/72  Pulse 41  Ht 6' (1.829 m)  Wt 177 lb (80.287 kg)  BMI 24.01 kg/m2 Well nourished, well developed, in no acute distress HEENT: normal Neck: no JVD Cardiac:  normal S1, prosthetic S2; RRR; no murmur Lungs:  clear to auscultation bilaterally, no wheezing, rhonchi or rales Abd:  soft, nontender, no hepatomegaly Ext: no edema Skin: warm and dry Neuro:  CNs 2-12 intact, no focal abnormalities noted  EKG:  Sinus Bradycardia, heart rate 45, normal axis, PACs      ASSESSMENT AND PLAN:  1.  Hypertension He had worsening migraine headaches without atenolol.  He is not on a very high dose of atenolol.  This is likely not adequate to control his blood pressure.  I have recommended starting amlodipine 5 mg daily.  Followup in one month.  2.  Migraine headaches I will titrate him off of atenolol.  Calcium channel blockers can also be used for migraine prophylaxis.  He may obtain a benefit from Norvasc.  I have asked him to followup with his PCP as he may need a different prophylactic medication to prevent headaches.  3.  Bradycardia He did feel somewhat lightheaded today.  I think he is symptomatic with his bradycardia.  He will decrease his atenolol to 12.5 mg daily.  I will keep him on this for 2 weeks after starting Norvasc.  This is in an effort to help reduce the frequency of headaches.  After 2 weeks, he will discontinue his atenolol.  4.  Aortic insufficiency, status post aortic valve replacement Coumadin managed by Coumadin clinic.  Recent echo stable.   Signed, Tereso Newcomer, PA-C  5:10 PM 09/22/2011

## 2011-09-23 ENCOUNTER — Ambulatory Visit (INDEPENDENT_AMBULATORY_CARE_PROVIDER_SITE_OTHER): Payer: Medicare Other | Admitting: Internal Medicine

## 2011-09-23 ENCOUNTER — Encounter: Payer: Self-pay | Admitting: Internal Medicine

## 2011-09-23 VITALS — BP 150/100 | HR 64 | Temp 97.5°F | Resp 16 | Wt 176.0 lb

## 2011-09-23 DIAGNOSIS — I498 Other specified cardiac arrhythmias: Secondary | ICD-10-CM | POA: Diagnosis not present

## 2011-09-23 DIAGNOSIS — G43909 Migraine, unspecified, not intractable, without status migrainosus: Secondary | ICD-10-CM

## 2011-09-23 DIAGNOSIS — N529 Male erectile dysfunction, unspecified: Secondary | ICD-10-CM | POA: Diagnosis not present

## 2011-09-23 DIAGNOSIS — I10 Essential (primary) hypertension: Secondary | ICD-10-CM

## 2011-09-23 DIAGNOSIS — M79603 Pain in arm, unspecified: Secondary | ICD-10-CM | POA: Insufficient documentation

## 2011-09-23 DIAGNOSIS — M79609 Pain in unspecified limb: Secondary | ICD-10-CM

## 2011-09-23 MED ORDER — DIPHENOXYLATE-ATROPINE 2.5-0.025 MG PO TABS: ORAL_TABLET | ORAL | Status: DC

## 2011-09-23 MED ORDER — CIPROFLOXACIN HCL 500 MG PO TABS: 500.0000 mg | ORAL_TABLET | Freq: Two times a day (BID) | ORAL | Status: DC

## 2011-09-23 MED ORDER — VARDENAFIL HCL 10 MG PO TBDP: 10.0000 mg | ORAL_TABLET | Freq: Every day | ORAL | Status: DC | PRN

## 2011-09-23 NOTE — Assessment & Plan Note (Signed)
Atenolol was helping w/migraine prophylaxis

## 2011-09-23 NOTE — Telephone Encounter (Signed)
appt today at4:15 

## 2011-09-23 NOTE — Assessment & Plan Note (Signed)
Not too well controlled May need to add ARB

## 2011-09-23 NOTE — Assessment & Plan Note (Signed)
Resolved

## 2011-09-23 NOTE — Progress Notes (Signed)
  Subjective:    Patient ID: Patrick [name], male    DOB: Nov 24, 1940, 71 y.o.   MRN: [id number]  HPI C/o high BP He had a migraine yesterday F/u bradycardia - resolved F/u L arm pain x weeks   Review of Systems  Constitutional: Negative for appetite change, fatigue and unexpected weight change.  HENT: Negative for nosebleeds, congestion, sore throat, sneezing, trouble swallowing and neck pain.   Eyes: Negative for itching and visual disturbance.  Respiratory: Negative for cough.   Cardiovascular: Negative for chest pain, palpitations and leg swelling.  Gastrointestinal: Negative for nausea, diarrhea, blood in stool and abdominal distention.  Genitourinary: Negative for dysuria, urgency, frequency, hematuria and enuresis.  Musculoskeletal: Negative for back pain, joint swelling and gait problem.  Skin: Negative for rash.  Neurological: Positive for headaches. Negative for dizziness, tremors, speech difficulty, weakness and light-headedness.  Psychiatric/Behavioral: Negative for suicidal ideas, disturbed wake/sleep cycle, dysphoric mood and agitation. The patient is not nervous/anxious.     Wt Readings from Last 3 Encounters:  09/23/11 176 lb (79.833 kg)  09/22/11 177 lb (80.287 kg)  05/27/11 179 lb (81.194 kg)   BP Readings from Last 3 Encounters:  09/23/11 150/100  09/22/11 166/72  05/27/11 170/90        Objective:   Physical Exam  Constitutional: He is oriented to person, place, and time. He appears well-developed and well-nourished.  HENT:  Mouth/Throat: Oropharynx is clear and moist.  Eyes: Conjunctivae are normal. Pupils are equal, round, and reactive to light.  Neck: Normal range of motion. No JVD present. No thyromegaly present.  Cardiovascular: Normal rate, regular rhythm, normal heart sounds and intact distal pulses.  Exam reveals no gallop and no friction rub.   No murmur heard. Pulmonary/Chest: Effort normal and breath sounds normal. No respiratory distress. He  has no wheezes. He has no rales. He exhibits no tenderness.  Abdominal: Soft. Bowel sounds are normal. He exhibits no distension and no mass. There is no tenderness. There is no rebound and no guarding.  Musculoskeletal: Normal range of motion. He exhibits tenderness. He exhibits no edema.       L dist deltoid is tender  Lymphadenopathy:    He has no cervical adenopathy.  Neurological: He is alert and oriented to person, place, and time. He has normal reflexes. No cranial nerve deficit. He exhibits normal muscle tone. Coordination normal.  Skin: Skin is warm and dry. No rash noted.  Psychiatric: He has a normal mood and affect. His behavior is normal. Judgment and thought content normal.   Lab Results  Component Value Date   WBC 8.0 03/26/2011   HGB 16.6 03/26/2011   HCT 47.9 03/26/2011   PLT 158.0 03/26/2011   GLUCOSE 126* 03/26/2011   CHOL 144 03/26/2011   TRIG 87.0 03/26/2011   HDL 48.90 03/26/2011   LDLCALC 78 03/26/2011   ALT 30 03/26/2011   AST 38* 03/26/2011   NA 141 03/26/2011   K 4.3 03/26/2011   CL 104 03/26/2011   CREATININE 1.1 03/26/2011   BUN 14 03/26/2011   CO2 32 03/26/2011   TSH 0.54 03/26/2011   PSA 2.27 09/18/2009   INR 3.2 09/12/2011   HGBA1C 5.4 03/05/2009          Assessment & Plan:

## 2011-09-23 NOTE — Assessment & Plan Note (Signed)
staxyn 10 mg prn

## 2011-09-23 NOTE — Assessment & Plan Note (Signed)
We can inject w/seroid He wants to use Voltaren gel first

## 2011-09-23 NOTE — Patient Instructions (Addendum)
BP Readings from Last 3 Encounters:  09/23/11 150/100  09/22/11 166/72  05/27/11 170/90    Goal BP<130/85  Try Staxyn 1 tab as needed

## 2011-10-13 ENCOUNTER — Ambulatory Visit (INDEPENDENT_AMBULATORY_CARE_PROVIDER_SITE_OTHER): Payer: Medicare Other | Admitting: Pharmacist

## 2011-10-13 DIAGNOSIS — I359 Nonrheumatic aortic valve disorder, unspecified: Secondary | ICD-10-CM | POA: Diagnosis not present

## 2011-10-13 DIAGNOSIS — Z7901 Long term (current) use of anticoagulants: Secondary | ICD-10-CM | POA: Diagnosis not present

## 2011-10-13 DIAGNOSIS — I712 Thoracic aortic aneurysm, without rupture, unspecified: Secondary | ICD-10-CM

## 2011-10-13 LAB — POCT INR: INR: 3.6

## 2011-10-24 ENCOUNTER — Ambulatory Visit (INDEPENDENT_AMBULATORY_CARE_PROVIDER_SITE_OTHER): Payer: Medicare Other | Admitting: Pharmacist

## 2011-10-24 DIAGNOSIS — I712 Thoracic aortic aneurysm, without rupture, unspecified: Secondary | ICD-10-CM

## 2011-10-24 DIAGNOSIS — Z7901 Long term (current) use of anticoagulants: Secondary | ICD-10-CM | POA: Diagnosis not present

## 2011-10-24 DIAGNOSIS — I359 Nonrheumatic aortic valve disorder, unspecified: Secondary | ICD-10-CM

## 2011-11-03 ENCOUNTER — Ambulatory Visit (INDEPENDENT_AMBULATORY_CARE_PROVIDER_SITE_OTHER): Payer: Medicare Other | Admitting: Physician Assistant

## 2011-11-03 ENCOUNTER — Encounter: Payer: Self-pay | Admitting: Physician Assistant

## 2011-11-03 VITALS — BP 142/90 | HR 70 | Ht 72.0 in | Wt 176.4 lb

## 2011-11-03 DIAGNOSIS — Z954 Presence of other heart-valve replacement: Secondary | ICD-10-CM

## 2011-11-03 DIAGNOSIS — I10 Essential (primary) hypertension: Secondary | ICD-10-CM | POA: Diagnosis not present

## 2011-11-03 DIAGNOSIS — I498 Other specified cardiac arrhythmias: Secondary | ICD-10-CM | POA: Diagnosis not present

## 2011-11-03 DIAGNOSIS — Z952 Presence of prosthetic heart valve: Secondary | ICD-10-CM

## 2011-11-03 NOTE — Progress Notes (Signed)
[address] [place], [place]  [address] Phone: [phone] Fax:  [phone]  Date:  11/03/2011   Name:  Patrick [name]   DOB:  Dec 30, 1940   MRN:  [id number]  PCP:  Sonda Primes, MD  Primary Cardiologist:  Dr. Valera Castle  Primary Electrophysiologist:  None    History of Present Illness: Patrick [name] is a 71 y.o. male who returns for f/u on BP.    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 Jude conduit in 02/2001 and migraine headaches.  He has had his beta blocker stopped in the past due to bradycardia.  However, due to worsening headaches his beta blocker was restarted. I saw him 09/22/11 due to elevated blood pressures. I started him on amlodipine 5 mg daily and titrated him off of his beta blocker.  He is doing well. Denies chest pain, shortness of breath, syncope. His frequency of migraines has gone down considerably. He has had increasing problems with low back pain. He has a history of HNP. He has been taking large amounts of ibuprofen.   Past Medical History  Diagnosis Date  . Anticoagulant long-term use   . BPH (benign prostatic hyperplasia)   . 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   Last echo 02/2009: EF 55-60%, Normal appearing "tissue valve" with trivial periprosthetic AR, mild MR, mild LAE  . HTN (hypertension)   . Migraine headache   . S/P cardiac cath     [facility] in [facility] demonstrated normal coronary arteries    Current Outpatient Prescriptions  Medication Sig Dispense Refill  . amLODipine (NORVASC) 5 MG tablet Take 1 tablet (5 mg total) by mouth daily.  180 tablet  3  . amoxicillin (AMOXIL) 500 MG capsule Take 4 caps 1 h prior to dental appointment  20 capsule  0  . atorvastatin (LIPITOR) 10 MG tablet take 1 tablet by mouth once daily  30 tablet  6  .  beclomethasone (QVAR) 80 MCG/ACT inhaler Inhale 2 puffs into the lungs as needed.  1 Inhaler  3  . ciprofloxacin (CIPRO) 500 MG tablet Take 1 tablet (500 mg total) by mouth 2 (two) times daily.  28 tablet  1  . desonide (DESOWEN) 0.05 % cream Apply 1 application topically daily as needed.  60 g  3  . diphenoxylate-atropine (LOMOTIL) 2.5-0.025 MG per tablet TAKE 1-2 TABLETS EVERY 8 HOURS AS NEEDED  60 tablet  0  . dutasteride (AVODART) 0.5 MG capsule Take 0.5 mg by mouth daily.        Marland Kitchen ipratropium (ATROVENT) 0.06 % nasal spray Place 2 sprays into the nose 3 (three) times daily.  15 mL  6  . SUMAtriptan (IMITREX) 50 MG tablet Take 0.5 tablets (25 mg total) by mouth every 2 (two) hours as needed.  10 tablet  5  . triamcinolone cream (KENALOG) 0.1 % Apply 1 application topically 2 (two) times daily.  30 g  3  . Vardenafil HCl (STAXYN) 10 MG TBDP Take 10 mg by mouth daily as needed.  10 tablet  1  . warfarin (COUMADIN) 5 MG tablet Take 1 tablet (5 mg total) by mouth as directed.  135 tablet  3    Allergies: Allergies  Allergen Reactions  . Codeine     rash  History  Substance Use Topics  . Smoking status: Former Games developer  . Smokeless tobacco: Not on file  . Alcohol Use: Yes     PHYSICAL EXAM: VS:  BP 142/90  Pulse 70  Ht 6' (1.829 m)  Wt 176 lb 6.4 oz (80.015 kg)  BMI 23.92 kg/m2 Repeat blood pressure by me: 130/80 on the left  Well nourished, well developed, in no acute distress HEENT: normal Neck: no JVD Cardiac:  normal S1, prosthetic S2; RRR; no murmur Lungs:  clear to auscultation bilaterally, no wheezing, rhonchi or rales Abd: soft, nontender, no hepatomegaly Ext: no edema Skin: warm and dry Neuro:  CNs 2-12 intact, no focal abnormalities noted  EKG:  Sinus rhythm, heart rate 70, normal axis, first degree AV block, PR interval 218 ms     ASSESSMENT AND PLAN:  1.  Hypertension:  Better controlled. Continue current therapy.  2.  Migraine headaches:  Management per  primary care.  3.  Bradycardia:  Resolved.  4.  Aortic insufficiency, s/p AVR:  Coumadin managed by Coumadin clinic.    5. Back Pain: Followup with primary care. I advised him to reduce the amount of ibuprofen taken in one day. He can try Tylenol 1000 mg twice a day on a scheduled basis to help until he can see his PCP.  Signed, Tereso Newcomer, PA-C  8:28 AM 11/03/2011

## 2011-11-03 NOTE — Patient Instructions (Addendum)
Your physician wants you to follow-up in: January 2014. You will receive a reminder letter in the mail two months in advance. If you don't receive a letter, please call our office to schedule the follow-up appointment.   NO CHANGES WERE MADE TODAY

## 2011-11-17 ENCOUNTER — Ambulatory Visit (INDEPENDENT_AMBULATORY_CARE_PROVIDER_SITE_OTHER): Payer: Medicare Other

## 2011-11-17 DIAGNOSIS — I359 Nonrheumatic aortic valve disorder, unspecified: Secondary | ICD-10-CM | POA: Diagnosis not present

## 2011-11-17 DIAGNOSIS — Z7901 Long term (current) use of anticoagulants: Secondary | ICD-10-CM | POA: Diagnosis not present

## 2011-11-17 DIAGNOSIS — I712 Thoracic aortic aneurysm, without rupture, unspecified: Secondary | ICD-10-CM | POA: Diagnosis not present

## 2011-12-18 ENCOUNTER — Ambulatory Visit (INDEPENDENT_AMBULATORY_CARE_PROVIDER_SITE_OTHER): Payer: Medicare Other | Admitting: *Deleted

## 2011-12-18 DIAGNOSIS — I712 Thoracic aortic aneurysm, without rupture, unspecified: Secondary | ICD-10-CM | POA: Diagnosis not present

## 2011-12-18 DIAGNOSIS — I359 Nonrheumatic aortic valve disorder, unspecified: Secondary | ICD-10-CM

## 2011-12-18 DIAGNOSIS — Z7901 Long term (current) use of anticoagulants: Secondary | ICD-10-CM

## 2011-12-23 ENCOUNTER — Encounter: Payer: Self-pay | Admitting: Internal Medicine

## 2011-12-23 ENCOUNTER — Ambulatory Visit (INDEPENDENT_AMBULATORY_CARE_PROVIDER_SITE_OTHER): Payer: Medicare Other | Admitting: Internal Medicine

## 2011-12-23 VITALS — BP 130/78 | HR 76 | Temp 97.6°F | Resp 16 | Wt 178.0 lb

## 2011-12-23 DIAGNOSIS — R51 Headache: Secondary | ICD-10-CM

## 2011-12-23 DIAGNOSIS — R222 Localized swelling, mass and lump, trunk: Secondary | ICD-10-CM

## 2011-12-23 DIAGNOSIS — Z7901 Long term (current) use of anticoagulants: Secondary | ICD-10-CM | POA: Diagnosis not present

## 2011-12-23 DIAGNOSIS — I10 Essential (primary) hypertension: Secondary | ICD-10-CM

## 2011-12-23 DIAGNOSIS — Z23 Encounter for immunization: Secondary | ICD-10-CM

## 2011-12-23 NOTE — Assessment & Plan Note (Signed)
Continue with current prescription therapy as reflected on the Med list.  

## 2011-12-23 NOTE — Progress Notes (Signed)
   Subjective:    Patient ID: Patrick [name], male    DOB: 1940/08/31, 71 y.o.   MRN: [id number]  HPI  C/o swelling on thor back and neck -- long time  Review of Systems  Constitutional: Negative for appetite change, fatigue and unexpected weight change.  HENT: Negative for nosebleeds, congestion, sore throat, sneezing, trouble swallowing and neck pain.   Eyes: Negative for itching and visual disturbance.  Respiratory: Negative for cough.   Cardiovascular: Negative for chest pain, palpitations and leg swelling.  Gastrointestinal: Negative for nausea, diarrhea, blood in stool and abdominal distention.  Genitourinary: Negative for dysuria, urgency, frequency, hematuria and enuresis.  Musculoskeletal: Negative for back pain, joint swelling and gait problem.  Skin: Negative for rash.  Neurological: Positive for headaches. Negative for dizziness, tremors, speech difficulty, weakness and light-headedness.  Psychiatric/Behavioral: Negative for suicidal ideas, disturbed wake/sleep cycle, dysphoric mood and agitation. The patient is not nervous/anxious.     Wt Readings from Last 3 Encounters:  12/23/11 178 lb (80.74 kg)  11/03/11 176 lb 6.4 oz (80.015 kg)  09/23/11 176 lb (79.833 kg)   BP Readings from Last 3 Encounters:  12/23/11 130/78  11/03/11 142/90  09/23/11 150/100        Objective:   Physical Exam  Constitutional: He is oriented to person, place, and time. He appears well-developed and well-nourished.  HENT:  Mouth/Throat: Oropharynx is clear and moist.  Eyes: Conjunctivae normal are normal. Pupils are equal, round, and reactive to light.  Neck: Normal range of motion. No JVD present. No thyromegaly present.  Cardiovascular: Normal rate, regular rhythm, normal heart sounds and intact distal pulses.  Exam reveals no gallop and no friction rub.   No murmur heard. Pulmonary/Chest: Effort normal and breath sounds normal. No respiratory distress. He has no wheezes. He has no  rales. He exhibits no tenderness.  Abdominal: Soft. Bowel sounds are normal. He exhibits no distension and no mass. There is no tenderness. There is no rebound and no guarding.  Musculoskeletal: Normal range of motion. He exhibits tenderness. He exhibits no edema.       L dist deltoid is tender  Lymphadenopathy:    He has no cervical adenopathy.  Neurological: He is alert and oriented to person, place, and time. He has normal reflexes. No cranial nerve deficit. He exhibits normal muscle tone. Coordination normal.  Skin: Skin is warm and dry. No rash noted.  Psychiatric: He has a normal mood and affect. His behavior is normal. Judgment and thought content normal.  L scap area oval 6x5 cm swelling, NT, small swelling L posterior knee Lab Results  Component Value Date   WBC 8.0 03/26/2011   HGB 16.6 03/26/2011   HCT 47.9 03/26/2011   PLT 158.0 03/26/2011   GLUCOSE 126* 03/26/2011   CHOL 144 03/26/2011   TRIG 87.0 03/26/2011   HDL 48.90 03/26/2011   LDLCALC 78 03/26/2011   ALT 30 03/26/2011   AST 38* 03/26/2011   NA 141 03/26/2011   K 4.3 03/26/2011   CL 104 03/26/2011   CREATININE 1.1 03/26/2011   BUN 14 03/26/2011   CO2 32 03/26/2011   TSH 0.54 03/26/2011   PSA 2.27 09/18/2009   INR 1.9 12/18/2011   HGBA1C 5.4 03/05/2009          Assessment & Plan:

## 2011-12-23 NOTE — Assessment & Plan Note (Signed)
Continue with current prn prescription therapy as reflected on the Med list.  

## 2011-12-23 NOTE — Assessment & Plan Note (Addendum)
10/13 L upper post chest - probable sebaceous cyst vs other. H also has a small cyst on his L posterior neck Surgical consult Dr Johna Sheriff

## 2012-01-06 ENCOUNTER — Encounter (INDEPENDENT_AMBULATORY_CARE_PROVIDER_SITE_OTHER): Payer: Self-pay | Admitting: General Surgery

## 2012-01-06 ENCOUNTER — Ambulatory Visit (INDEPENDENT_AMBULATORY_CARE_PROVIDER_SITE_OTHER): Payer: Medicare Other | Admitting: General Surgery

## 2012-01-06 VITALS — BP 132/84 | HR 85 | Temp 97.9°F | Ht 72.0 in | Wt 174.6 lb

## 2012-01-06 DIAGNOSIS — L723 Sebaceous cyst: Secondary | ICD-10-CM | POA: Diagnosis not present

## 2012-01-06 DIAGNOSIS — D179 Benign lipomatous neoplasm, unspecified: Secondary | ICD-10-CM

## 2012-01-06 NOTE — Patient Instructions (Signed)
Will need cardiac clearance before scheduling

## 2012-01-19 ENCOUNTER — Ambulatory Visit (INDEPENDENT_AMBULATORY_CARE_PROVIDER_SITE_OTHER): Payer: Medicare Other

## 2012-01-19 DIAGNOSIS — Z7901 Long term (current) use of anticoagulants: Secondary | ICD-10-CM | POA: Diagnosis not present

## 2012-01-19 DIAGNOSIS — I712 Thoracic aortic aneurysm, without rupture, unspecified: Secondary | ICD-10-CM | POA: Diagnosis not present

## 2012-01-19 DIAGNOSIS — I359 Nonrheumatic aortic valve disorder, unspecified: Secondary | ICD-10-CM

## 2012-01-20 ENCOUNTER — Other Ambulatory Visit (INDEPENDENT_AMBULATORY_CARE_PROVIDER_SITE_OTHER): Payer: Self-pay | Admitting: General Surgery

## 2012-01-20 ENCOUNTER — Telehealth (INDEPENDENT_AMBULATORY_CARE_PROVIDER_SITE_OTHER): Payer: Self-pay | Admitting: General Surgery

## 2012-01-20 ENCOUNTER — Encounter (INDEPENDENT_AMBULATORY_CARE_PROVIDER_SITE_OTHER): Payer: Self-pay

## 2012-01-20 ENCOUNTER — Other Ambulatory Visit: Payer: Self-pay | Admitting: Internal Medicine

## 2012-01-20 NOTE — Progress Notes (Signed)
Subjective:     Patient ID: Patrick [name], male   DOB: 1940-07-19, 71 y.o.   MRN: [id number]  HPI We're asked to see the patient in consultation by Dr.[name] to evaluate him for a cyst on his neck and mass on his back. The patient is a 71 year old white male who has had a mass on his back for quite some time. The mass is gradually gotten a little bit larger. It does cause him some discomfort. The area has never drained any fluid. He denies any fevers. He also has a cyst at the angle of his left jaw that periodically gets inflamed and then goes down.  Review of Systems  Constitutional: Negative.   HENT: Negative.   Eyes: Negative.   Respiratory: Negative.   Cardiovascular: Negative.   Gastrointestinal: Negative.   Genitourinary: Negative.   Musculoskeletal: Positive for back pain.  Skin: Negative.   Neurological: Negative.   Hematological: Negative.   Psychiatric/Behavioral: Negative.        Objective:   Physical Exam  Constitutional: He is oriented to person, place, and time. He appears well-developed and well-nourished.  HENT:  Head: Normocephalic and atraumatic.  Eyes: Conjunctivae normal and EOM are normal. Pupils are equal, round, and reactive to light.  Neck: Normal range of motion. Neck supple.       There appears to be a small sebaceous cyst at the angle of his mandible on the left. This is palpable and mobile and isolated and the skin  Cardiovascular: Normal rate, regular rhythm and normal heart sounds.        There is an audible click  Pulmonary/Chest: Effort normal and breath sounds normal.  Abdominal: Soft. Bowel sounds are normal.  Musculoskeletal: Normal range of motion.       The patient has what feels like a larger lipoma on his upper back  Lymphadenopathy:    He has no cervical adenopathy.  Neurological: He is alert and oriented to person, place, and time.  Skin: Skin is warm and dry.  Psychiatric: He has a normal mood and affect. His behavior is normal.        Assessment:     The patient has a lipoma on his upper back that is uncomfortable as well as a sebaceous cyst on the angle of his left jaw that periodically drains. I think he would be reasonable to remove both of these areas. He would also like to have this done. I've discussed with him in detail the risk and benefits of the operation removed both of these areas as well as some of the technical aspects and he understands and wishes to proceed.    Plan:     We will plan to excise the sebaceous cyst on his left jaw and the lipoma on his upper back once we have cardiac clearance and are able to stop his Coumadin.

## 2012-01-20 NOTE — Telephone Encounter (Signed)
Spoke with pt and informed him that I have received his cardiac clearance from Dr. Daleen Squibb and that he can stop his coumadin 5 days before the surgery date.  Informed him that I am taking his surgery orders around to our surgery schedulers and that they should be calling him in the next couple of days to schedule that with him over the phone.  He said this would be fine.

## 2012-02-09 ENCOUNTER — Ambulatory Visit (INDEPENDENT_AMBULATORY_CARE_PROVIDER_SITE_OTHER): Payer: Medicare Other | Admitting: Pharmacist

## 2012-02-09 DIAGNOSIS — Z7901 Long term (current) use of anticoagulants: Secondary | ICD-10-CM

## 2012-02-09 DIAGNOSIS — I712 Thoracic aortic aneurysm, without rupture, unspecified: Secondary | ICD-10-CM | POA: Diagnosis not present

## 2012-02-09 DIAGNOSIS — I359 Nonrheumatic aortic valve disorder, unspecified: Secondary | ICD-10-CM | POA: Diagnosis not present

## 2012-02-11 DIAGNOSIS — N400 Enlarged prostate without lower urinary tract symptoms: Secondary | ICD-10-CM | POA: Diagnosis not present

## 2012-02-12 DIAGNOSIS — Z79899 Other long term (current) drug therapy: Secondary | ICD-10-CM | POA: Diagnosis not present

## 2012-02-20 ENCOUNTER — Encounter (HOSPITAL_COMMUNITY): Payer: Self-pay | Admitting: Respiratory Therapy

## 2012-02-24 NOTE — Pre-Procedure Instructions (Signed)
20 Patrick [name]  02/24/2012   Your procedure is scheduled on:  Wednesday, January 8th   Report to [facility] at 6:30 AM.  Call this number if you have problems the morning of surgery: [phone]   Remember:   Do not eat food or drink any liquids:After Midnight Tuesday.    Take these medicines the morning of surgery with A SIP OF WATER: Norvasc, Avodart   Do not wear jewelry. Do not wear lotions, powders, or colognes. You may  NOT wear deodorant.   Men may shave face and neck.   Do not bring valuables to the hospital.  Contacts, dentures or bridgework may not be worn into surgery.   Leave suitcase in the car. After surgery it may be brought to your room.  For patients admitted to the hospital, checkout time is 11:00 AM the day of discharge.   Patients discharged the day of surgery will not be allowed to drive home AND someone needs to stay with him overnight for the first 24 hrs after surgery.   Name and phone number of your driver:   Special Instructions: Shower using CHG 2 nights before surgery and the night before surgery.  If you shower the day of surgery use CHG.  Use special wash - you have one bottle of CHG for all showers.  You should use approximately 1/3 of the bottle for each shower.   Please read over the following fact sheets that you were given: Pain Booklet, MRSA Information and Surgical Site Infection Prevention

## 2012-02-26 ENCOUNTER — Ambulatory Visit (HOSPITAL_COMMUNITY)
Admission: RE | Admit: 2012-02-26 | Discharge: 2012-02-26 | Disposition: A | Discharge status: Home / Self Care | Payer: Medicare Other | Source: Ambulatory Visit | Attending: Anesthesiology | Admitting: Anesthesiology

## 2012-02-26 ENCOUNTER — Encounter (HOSPITAL_COMMUNITY)
Admission: RE | Admit: 2012-02-26 | Discharge: 2012-02-26 | Disposition: A | Discharge status: Home / Self Care | Payer: Medicare Other | Source: Ambulatory Visit | Attending: General Surgery | Admitting: General Surgery

## 2012-02-26 ENCOUNTER — Encounter (HOSPITAL_COMMUNITY): Payer: Self-pay

## 2012-02-26 DIAGNOSIS — Z01818 Encounter for other preprocedural examination: Secondary | ICD-10-CM | POA: Insufficient documentation

## 2012-02-26 DIAGNOSIS — I719 Aortic aneurysm of unspecified site, without rupture: Secondary | ICD-10-CM | POA: Diagnosis not present

## 2012-02-26 DIAGNOSIS — I10 Essential (primary) hypertension: Secondary | ICD-10-CM | POA: Insufficient documentation

## 2012-02-26 LAB — PROTIME-INR
INR: 1.43 (ref 0.00–1.49)
Prothrombin Time: 17.1 seconds — ABNORMAL HIGH (ref 11.6–15.2)

## 2012-02-26 LAB — COMPREHENSIVE METABOLIC PANEL
ALT: 33 U/L (ref 0–53)
AST: 35 U/L (ref 0–37)
CO2: 27 mEq/L (ref 19–32)
Chloride: 105 mEq/L (ref 96–112)
Creatinine, Ser: 1.06 mg/dL (ref 0.50–1.35)
GFR calc Af Amer: 81 mL/min — ABNORMAL LOW (ref >90)
GFR calc non Af Amer: 70 mL/min — ABNORMAL LOW (ref >90)
Glucose, Bld: 86 mg/dL (ref 70–99)
Sodium: 140 mEq/L (ref 135–145)
Total Bilirubin: 0.6 mg/dL (ref 0.3–1.2)

## 2012-02-26 LAB — CBC
MCH: 31.8 pg (ref 26.0–34.0)
MCHC: 35.7 g/dL (ref 30.0–36.0)
Platelets: 183 10*3/uL (ref 150–400)
RDW: 12.7 % (ref 11.5–15.5)

## 2012-02-26 LAB — SURGICAL PCR SCREEN: Staphylococcus aureus: POSITIVE — AB

## 2012-02-26 NOTE — Progress Notes (Addendum)
02/26/2012  Thursday....Marland KitchenMarland KitchenPatient had AVR & Aneurysm repair with [employer] conduit in Jan. 2003--ejection fraction was 55-60% then...Marland KitchenMarland KitchenDA He will stop his coumadin as instructed by surgeon's office....da Dr. Juanito Doom is cardio....notes in epic and clearance note in chart...da

## 2012-02-27 DIAGNOSIS — N400 Enlarged prostate without lower urinary tract symptoms: Secondary | ICD-10-CM | POA: Diagnosis not present

## 2012-02-27 DIAGNOSIS — N529 Male erectile dysfunction, unspecified: Secondary | ICD-10-CM | POA: Diagnosis not present

## 2012-02-27 DIAGNOSIS — M545 Low back pain, unspecified: Secondary | ICD-10-CM | POA: Diagnosis not present

## 2012-03-02 MED ORDER — CEFAZOLIN SODIUM-DEXTROSE 2-3 GM-% IV SOLR
2.0000 g | INTRAVENOUS | Status: AC
  Administered 2012-03-03: 2 g via INTRAVENOUS
  Filled 2012-03-02: qty 50

## 2012-03-03 ENCOUNTER — Encounter (HOSPITAL_COMMUNITY): Payer: Self-pay | Admitting: Anesthesiology

## 2012-03-03 ENCOUNTER — Encounter (HOSPITAL_COMMUNITY)
Admission: RE | Disposition: A | Discharge status: Home / Self Care | Payer: Self-pay | Source: Ambulatory Visit | Attending: General Surgery

## 2012-03-03 ENCOUNTER — Ambulatory Visit (HOSPITAL_COMMUNITY): Payer: Medicare Other | Admitting: Anesthesiology

## 2012-03-03 ENCOUNTER — Ambulatory Visit (HOSPITAL_COMMUNITY)
Admission: RE | Admit: 2012-03-03 | Discharge: 2012-03-03 | Disposition: A | Discharge status: Home / Self Care | Payer: Medicare Other | Source: Ambulatory Visit | Attending: General Surgery | Admitting: General Surgery

## 2012-03-03 ENCOUNTER — Encounter (HOSPITAL_COMMUNITY): Payer: Self-pay | Admitting: Surgery

## 2012-03-03 DIAGNOSIS — D179 Benign lipomatous neoplasm, unspecified: Secondary | ICD-10-CM

## 2012-03-03 DIAGNOSIS — M2749 Other cysts of jaw: Secondary | ICD-10-CM | POA: Diagnosis not present

## 2012-03-03 DIAGNOSIS — D1739 Benign lipomatous neoplasm of skin and subcutaneous tissue of other sites: Secondary | ICD-10-CM | POA: Diagnosis not present

## 2012-03-03 DIAGNOSIS — I10 Essential (primary) hypertension: Secondary | ICD-10-CM | POA: Insufficient documentation

## 2012-03-03 DIAGNOSIS — L723 Sebaceous cyst: Secondary | ICD-10-CM | POA: Insufficient documentation

## 2012-03-03 HISTORY — PX: EAR CYST EXCISION: SHX22

## 2012-03-03 HISTORY — PX: LIPOMA EXCISION: SHX5283

## 2012-03-03 SURGERY — EXCISION LIPOMA
Anesthesia: Monitor Anesthesia Care | Site: Neck | Wound class: Clean

## 2012-03-03 MED ORDER — PROPOFOL INFUSION 10 MG/ML OPTIME
INTRAVENOUS | Status: DC | PRN
  Administered 2012-03-03: 75 ug/kg/min via INTRAVENOUS

## 2012-03-03 MED ORDER — CHLORHEXIDINE GLUCONATE 4 % EX LIQD: 1.0000 "application " | Freq: Once | CUTANEOUS | Status: DC

## 2012-03-03 MED ORDER — LIDOCAINE HCL (PF) 1 % IJ SOLN
INTRAMUSCULAR | Status: AC
  Filled 2012-03-03: qty 30

## 2012-03-03 MED ORDER — ONDANSETRON HCL 4 MG/2ML IJ SOLN
INTRAMUSCULAR | Status: DC | PRN
  Administered 2012-03-03: 4 mg via INTRAVENOUS

## 2012-03-03 MED ORDER — FENTANYL CITRATE 0.05 MG/ML IJ SOLN
INTRAMUSCULAR | Status: DC | PRN
  Administered 2012-03-03: 50 ug via INTRAVENOUS

## 2012-03-03 MED ORDER — CHLORHEXIDINE GLUCONATE 4 % EX LIQD
1.0000 | Freq: Once | CUTANEOUS | Status: DC
Start: 2012-03-03 — End: 2012-03-03

## 2012-03-03 MED ORDER — PROMETHAZINE HCL 25 MG/ML IJ SOLN: 6.2500 mg | INTRAMUSCULAR | Status: DC | PRN

## 2012-03-03 MED ORDER — OXYCODONE HCL 5 MG/5ML PO SOLN: 5.0000 mg | Freq: Once | ORAL | Status: DC | PRN

## 2012-03-03 MED ORDER — OXYCODONE HCL 5 MG PO TABS: 5.0000 mg | ORAL_TABLET | Freq: Once | ORAL | Status: DC | PRN

## 2012-03-03 MED ORDER — BUPIVACAINE-EPINEPHRINE 0.25% -1:200000 IJ SOLN
INTRAMUSCULAR | Status: DC | PRN
  Administered 2012-03-03: 10 mL

## 2012-03-03 MED ORDER — BUPIVACAINE-EPINEPHRINE PF 0.25-1:200000 % IJ SOLN
INTRAMUSCULAR | Status: AC
  Filled 2012-03-03: qty 30

## 2012-03-03 MED ORDER — MIDAZOLAM HCL 5 MG/5ML IJ SOLN
INTRAMUSCULAR | Status: DC | PRN
  Administered 2012-03-03 (×2): 1 mg via INTRAVENOUS

## 2012-03-03 MED ORDER — HYDROCODONE-ACETAMINOPHEN 5-325 MG PO TABS: 1.0000 | ORAL_TABLET | Freq: Four times a day (QID) | ORAL | Status: DC | PRN

## 2012-03-03 MED ORDER — FENTANYL CITRATE 0.05 MG/ML IJ SOLN: 25.0000 ug | INTRAMUSCULAR | Status: DC | PRN

## 2012-03-03 MED ORDER — LIDOCAINE HCL (CARDIAC) 20 MG/ML IV SOLN
INTRAVENOUS | Status: DC | PRN
  Administered 2012-03-03: 50 mg via INTRAVENOUS

## 2012-03-03 MED ORDER — MIDAZOLAM HCL 2 MG/2ML IJ SOLN: 0.5000 mg | Freq: Once | INTRAMUSCULAR | Status: DC | PRN

## 2012-03-03 MED ORDER — LIDOCAINE HCL 1 % IJ SOLN
INTRAMUSCULAR | Status: DC | PRN
  Administered 2012-03-03: 4 mL

## 2012-03-03 MED ORDER — MEPERIDINE HCL 25 MG/ML IJ SOLN: 6.2500 mg | INTRAMUSCULAR | Status: DC | PRN

## 2012-03-03 MED ORDER — 0.9 % SODIUM CHLORIDE (POUR BTL) OPTIME
TOPICAL | Status: DC | PRN
  Administered 2012-03-03: 1000 mL

## 2012-03-03 MED ORDER — LACTATED RINGERS IV SOLN
INTRAVENOUS | Status: DC | PRN
  Administered 2012-03-03: 08:00:00 via INTRAVENOUS

## 2012-03-03 SURGICAL SUPPLY — 45 items
BLADE SURG 10 STRL SS (BLADE) ×3 IMPLANT
BLADE SURG 15 STRL LF DISP TIS (BLADE) ×2 IMPLANT
BLADE SURG 15 STRL SS (BLADE) ×1
CANISTER SUCTION 2500CC (MISCELLANEOUS) ×3 IMPLANT
CHLORAPREP W/TINT 26ML (MISCELLANEOUS) ×3 IMPLANT
CLEANER TIP ELECTROSURG 2X2 (MISCELLANEOUS) ×3 IMPLANT
CLOTH BEACON ORANGE TIMEOUT ST (SAFETY) ×3 IMPLANT
COVER SURGICAL LIGHT HANDLE (MISCELLANEOUS) ×3 IMPLANT
DECANTER SPIKE VIAL GLASS SM (MISCELLANEOUS) ×3 IMPLANT
DERMABOND ADVANCED (GAUZE/BANDAGES/DRESSINGS) ×2
DERMABOND ADVANCED .7 DNX12 (GAUZE/BANDAGES/DRESSINGS) ×4 IMPLANT
DRAPE EXTREMITY T 121X128X90 (DRAPE) IMPLANT
DRAPE LAPAROSCOPIC ABDOMINAL (DRAPES) IMPLANT
DRAPE ORTHO SPLIT 77X108 STRL (DRAPES) ×1
DRAPE PED LAPAROTOMY (DRAPES) ×3 IMPLANT
DRAPE SURG ORHT 6 SPLT 77X108 (DRAPES) ×2 IMPLANT
DRAPE UTILITY 15X26 W/TAPE STR (DRAPE) ×6 IMPLANT
ELECT CAUTERY BLADE 6.4 (BLADE) ×3 IMPLANT
ELECT REM PT RETURN 9FT ADLT (ELECTROSURGICAL) ×3
ELECTRODE REM PT RTRN 9FT ADLT (ELECTROSURGICAL) ×2 IMPLANT
GAUZE SPONGE 4X4 16PLY XRAY LF (GAUZE/BANDAGES/DRESSINGS) IMPLANT
GLOVE BIO SURGEON STRL SZ7 (GLOVE) ×3 IMPLANT
GLOVE BIO SURGEON STRL SZ7.5 (GLOVE) ×6 IMPLANT
GOWN STRL NON-REIN LRG LVL3 (GOWN DISPOSABLE) ×9 IMPLANT
KIT BASIN OR (CUSTOM PROCEDURE TRAY) ×3 IMPLANT
KIT ROOM TURNOVER OR (KITS) ×3 IMPLANT
NEEDLE HYPO 25X1 1.5 SAFETY (NEEDLE) ×3 IMPLANT
NEEDLE HYPO 30X.5 LL (NEEDLE) ×3 IMPLANT
NS IRRIG 1000ML POUR BTL (IV SOLUTION) ×3 IMPLANT
PACK SURGICAL SETUP 50X90 (CUSTOM PROCEDURE TRAY) ×3 IMPLANT
PAD ARMBOARD 7.5X6 YLW CONV (MISCELLANEOUS) ×6 IMPLANT
PENCIL BUTTON HOLSTER BLD 10FT (ELECTRODE) ×3 IMPLANT
SPECIMEN JAR SMALL (MISCELLANEOUS) ×6 IMPLANT
SPONGE GAUZE 4X4 12PLY (GAUZE/BANDAGES/DRESSINGS) ×3 IMPLANT
SPONGE LAP 18X18 X RAY DECT (DISPOSABLE) ×3 IMPLANT
SUT MNCRL AB 4-0 PS2 18 (SUTURE) ×3 IMPLANT
SUT VIC AB 3-0 SH 27 (SUTURE) ×1
SUT VIC AB 3-0 SH 27XBRD (SUTURE) ×2 IMPLANT
SYR BULB 3OZ (MISCELLANEOUS) ×3 IMPLANT
SYR CONTROL 10ML LL (SYRINGE) ×3 IMPLANT
TOWEL OR 17X24 6PK STRL BLUE (TOWEL DISPOSABLE) ×3 IMPLANT
TOWEL OR 17X26 10 PK STRL BLUE (TOWEL DISPOSABLE) ×3 IMPLANT
TUBE CONNECTING 12X1/4 (SUCTIONS) ×3 IMPLANT
WATER STERILE IRR 1000ML POUR (IV SOLUTION) IMPLANT
YANKAUER SUCT BULB TIP NO VENT (SUCTIONS) ×3 IMPLANT

## 2012-03-03 NOTE — Transfer of Care (Signed)
Immediate Anesthesia Transfer of Care Note  Patient: Patrick [name]  Procedure(s) Performed: Procedure(s) (LRB) with comments: EXCISION LIPOMA (N/A) - lipoma back CYST REMOVAL (Left) - excision cyst left jaw  Patient Location: PACU  Anesthesia Type:MAC  Level of Consciousness: awake, alert  and oriented  Airway & Oxygen Therapy: Patient Spontanous Breathing and Patient connected to nasal cannula oxygen  Post-op Assessment: Report given to PACU RN, Post -op Vital signs reviewed and stable and Patient moving all extremities X 4  Post vital signs: Reviewed and stable  Complications: No apparent anesthesia complications

## 2012-03-03 NOTE — Interval H&P Note (Signed)
History and Physical Interval Note:  03/03/2012 9:47 AM  Patrick [name]  has presented today for surgery, with the diagnosis of cyst left jaw/lipoma back  The various methods of treatment have been discussed with the patient and family. After consideration of risks, benefits and other options for treatment, the patient has consented to  Procedure(s) (LRB) with comments: EXCISION LIPOMA (N/A) - lipoma back CYST REMOVAL (Left) - excision cyst left jaw as a surgical intervention .  The patient's history has been reviewed, patient examined, no change in status, stable for surgery.  I have reviewed the patient's chart and labs.  Questions were answered to the patient's satisfaction.     TOTH III,Mikal Blasdell S

## 2012-03-03 NOTE — Preoperative (Signed)
Beta Blockers   Reason not to administer Beta Blockers: not prescribed 

## 2012-03-03 NOTE — Anesthesia Preprocedure Evaluation (Signed)
Anesthesia Evaluation  Patient identified by MRN, date of birth, ID band Patient awake    Reviewed: Allergy & Precautions, H&P , NPO status , Patient's Chart, lab work & pertinent test results  History of Anesthesia Complications Negative for: history of anesthetic complications  Airway Mallampati: I TM Distance: >3 FB Neck ROM: Full    Dental  (+) Teeth Intact, Caps and Dental Advisory Given   Pulmonary former smoker,  breath sounds clear to auscultation  Pulmonary exam normal       Cardiovascular hypertension, - dysrhythmias + Valvular Problems/Murmurs (s/p AVR, [place], on coumadin (off since 02/27/12), INR 1.4 on 02/26/12) Rhythm:Regular Rate:Normal     Neuro/Psych negative neurological ROS     GI/Hepatic negative GI ROS, Neg liver ROS,   Endo/Other  negative endocrine ROS  Renal/GU negative Renal ROS     Musculoskeletal   Abdominal   Peds  Hematology   Anesthesia Other Findings   Reproductive/Obstetrics                           Anesthesia Physical Anesthesia Plan  ASA: III  Anesthesia Plan: MAC   Post-op Pain Management:    Induction: Intravenous  Airway Management Planned: Simple Face Mask and Natural Airway  Additional Equipment:   Intra-op Plan:   Post-operative Plan:   Informed Consent: I have reviewed the patients History and Physical, chart, labs and discussed the procedure including the risks, benefits and alternatives for the proposed anesthesia with the patient or authorized representative who has indicated his/her understanding and acceptance.   Dental advisory given  Plan Discussed with: CRNA and Surgeon  Anesthesia Plan Comments: (Plan routine monitors, MAC)        Anesthesia Quick Evaluation

## 2012-03-03 NOTE — OR Nursing (Signed)
Procedure 1: Excision left jaw cyst Start: 0846 End: 0856  Procedure 2: Excision of back lipoma Start: 1610 End: 9604

## 2012-03-03 NOTE — Anesthesia Postprocedure Evaluation (Signed)
  Anesthesia Post-op Note  Patient: Patrick [name]  Procedure(s) Performed: Procedure(s) (LRB) with comments: EXCISION LIPOMA (N/A) - lipoma back CYST REMOVAL (Left) - excision cyst left jaw  Patient Location: PACU  Anesthesia Type:MAC  Level of Consciousness: awake, alert , oriented and patient cooperative  Airway and Oxygen Therapy: Patient Spontanous Breathing  Post-op Pain: none  Post-op Assessment: Post-op Vital signs reviewed, Patient's Cardiovascular Status Stable, Respiratory Function Stable, Patent Airway, No signs of Nausea or vomiting and Pain level controlled  Post-op Vital Signs: Reviewed and stable  Complications: No apparent anesthesia complications

## 2012-03-03 NOTE — H&P (Signed)
[name]   01/06/2012 10:10 AM Office Visit  MRN: [id number]   Description: 72 year old male  Provider: Robyne Askew, MD  Department: [facility]        Diagnoses     Lipoma of unspecified site   - Primary    214.9    Sebaceous cyst     706.2      Reason for Visit     Pre-op Exam    eval chest wall mass        Vitals - Last Recorded       BP Pulse Temp Ht Wt BMI    132/84 85 97.9 F (36.6 C) (Temporal) 6' (1.829 m) 174 lb 9.6 oz (79.198 kg) 23.68 kg/m2         SpO2              98%                 Progress Notes     Robyne Askew, MD  01/20/2012 10:08 AM  SignedSubjective:      Patient ID: Patrick [name], male   DOB: [birth year], 72 y.o.   MRN: [id number]   HPI We're asked to see the patient in consultation by Dr.[name] to evaluate him for a cyst on his neck and mass on his back. The patient is a 72 year old white male who has had a mass on his back for quite some time. The mass is gradually gotten a little bit larger. It does cause him some discomfort. The area has never drained any fluid. He denies any fevers. He also has a cyst at the angle of his left jaw that periodically gets inflamed and then goes down.   Review of Systems  Constitutional: Negative.   HENT: Negative.   Eyes: Negative.   Respiratory: Negative.   Cardiovascular: Negative.   Gastrointestinal: Negative.   Genitourinary: Negative.   Musculoskeletal: Positive for back pain.  Skin: Negative.   Neurological: Negative.   Hematological: Negative.   Psychiatric/Behavioral: Negative.       Objective:    Physical Exam  Constitutional: He is oriented to person, place, and time. He appears well-developed and well-nourished.  HENT:   Head: Normocephalic and atraumatic.  Eyes: Conjunctivae normal and EOM are normal. Pupils are equal, round, and reactive to light.  Neck: Normal range of motion. Neck supple.       There appears to be a small sebaceous cyst at the angle  of his mandible on the left. This is palpable and mobile and isolated and the skin  Cardiovascular: Normal rate, regular rhythm and normal heart sounds.        There is an audible click  Pulmonary/Chest: Effort normal and breath sounds normal.  Abdominal: Soft. Bowel sounds are normal.  Musculoskeletal: Normal range of motion.       The patient has what feels like a larger lipoma on his upper back  Lymphadenopathy:    He has no cervical adenopathy.  Neurological: He is alert and oriented to person, place, and time.  Skin: Skin is warm and dry.  Psychiatric: He has a normal mood and affect. His behavior is normal.      Assessment:    The patient has a lipoma on his upper back that is uncomfortable as well as a sebaceous cyst on the angle of his left jaw that periodically drains. I think he would be reasonable to remove both of these areas.  He would also like to have this done. I've discussed with him in detail the risk and benefits of the operation removed both of these areas as well as some of the technical aspects and he understands and wishes to proceed.   Plan:    We will plan to excise the sebaceous cyst on his left jaw and the lipoma on his upper back once we have cardiac clearance and are able to stop his Coumadin.                Not recorded             Patient Instructions     Will need cardiac clearance before scheduling       Level of Service     PR OFFICE CONSULTATION NEW/ESTAB PATIENT 60 MIN [95621]         All Flowsheet Templates (all recorded)     Encounter Vitals Flowsheet    Custom Formula Data Flowsheet    Anthropometrics Flowsheet                   Letters     Letter Information         Status    Caleen Essex III on 01/06/2012 Sent           Referring Provider     Tresa Garter, MD       All Charges for This Encounter       Code Description Service Date Service Provider Modifiers Quantity    [phone] PR OFFICE  OUTPATIENT NEW 60 MINUTES 01/06/2012 Robyne Askew, MD   1    [phone] PR CURRENT TOBACCO NON-USER 01/06/2012 Robyne Askew, MD   1      Routing History       Recipient Method User Date Routed To    Gaylord Shih, MD In [facility] [place] [[id number]] 01/06/2012 (none on file)     Letter: created on 01/06/2012 by Robyne Askew, MD              Other Encounter Related Information     Allergies & Medications         Problem List         History         Patient-Entered Questionnaires   Printed AVS Reports       Printed at   Printed by    01/06/2012 10:37 AM After Visit Summary Robyne Askew, MD        No data filed

## 2012-03-03 NOTE — Op Note (Signed)
03/03/2012  9:32 AM  PATIENT:  Patrick [name]  72 y.o. male  PRE-OPERATIVE DIAGNOSIS:  cyst left jaw/lipoma back  POST-OPERATIVE DIAGNOSIS:  cyst left jaw/lipoma back  PROCEDURE:  Procedure(s) (LRB) with comments: EXCISION LIPOMA (N/A) - lipoma back CYST REMOVAL (Left) - excision cyst left jaw  SURGEON:  Surgeon(s) and Role:    * Robyne Askew, MD - Primary  PHYSICIAN ASSISTANT:   ASSISTANTS: none   ANESTHESIA:   local and IV sedation  EBL:     BLOOD ADMINISTERED:none  DRAINS: none   LOCAL MEDICATIONS USED:  MARCAINE    and LIDOCAINE   SPECIMEN:  Source of Specimen:  cyst from neck and lipoma from back  DISPOSITION OF SPECIMEN:  PATHOLOGY  COUNTS:  YES  TOURNIQUET:  * No tourniquets in log *  DICTATION: .Dragon Dictation After informed consent was obtained patient brought to the operating room and placed in the supine position on the operating table. After adequate IV sedation be given the patient's left neck was prepped with ChloraPrep, allowed to dry, and draped in usual sterile manner. The area around the cyst was infiltrated with quarter percent Marcaine with epinephrine until a good field block was created. The cyst was then excised sharply with a 15 blade knife in an elliptical fashion until the entire cyst was removed. Hemostasis was achieved using the electrocautery. The wound was then closed with interrupted 4-0 Monocryl subcuticular stitches. Dermabond dressings were applied. The patient was then moved into a left side up lateral position and all pressure points were padded. The lipoma on his upper left back was then prepped with ChloraPrep, allowed to dry, and draped in usual sterile manner. The area around the lipoma was infiltrated with 1% lidocaine and quarter percent Marcaine with epinephrine until good field block was created. A small transversely oriented incision was then made overlying the palpable fatty mass with a 15 blade knife. The incision was carried  to the skin and subcutaneous tissue sharply with electrocautery until the lipoma was encountered. The lipoma was then braided from the rest of the tissue by blunt hemostat dissection. The area where the lipoma was was then fulgurated with cautery. The lipoma was removed and sent to pathology for further evaluation. The deep layer the wound was then closed with interrupted 3 Marcos stitches. The skin was then closed with interrupted 4 Monocryl subcuticular stitches. Dermabond dressings were applied. The patient tolerated the procedure well. At the end of the case all needle sponge and instrument counts are correct. The patient was then awakened and taken recovery in stable condition.  PLAN OF CARE: Discharge to home after PACU  PATIENT DISPOSITION:  PACU - hemodynamically stable.   Delay start of Pharmacological VTE agent (>24hrs) due to surgical blood loss or risk of bleeding: not applicable

## 2012-03-04 ENCOUNTER — Encounter (HOSPITAL_COMMUNITY): Payer: Self-pay | Admitting: General Surgery

## 2012-03-09 ENCOUNTER — Ambulatory Visit (INDEPENDENT_AMBULATORY_CARE_PROVIDER_SITE_OTHER): Payer: Medicare Other

## 2012-03-09 DIAGNOSIS — I712 Thoracic aortic aneurysm, without rupture, unspecified: Secondary | ICD-10-CM

## 2012-03-09 DIAGNOSIS — Z7901 Long term (current) use of anticoagulants: Secondary | ICD-10-CM

## 2012-03-09 DIAGNOSIS — I359 Nonrheumatic aortic valve disorder, unspecified: Secondary | ICD-10-CM | POA: Diagnosis not present

## 2012-03-11 ENCOUNTER — Encounter: Payer: Self-pay | Admitting: Cardiology

## 2012-03-26 ENCOUNTER — Ambulatory Visit (INDEPENDENT_AMBULATORY_CARE_PROVIDER_SITE_OTHER): Payer: Medicare Other | Admitting: *Deleted

## 2012-03-26 DIAGNOSIS — Z7901 Long term (current) use of anticoagulants: Secondary | ICD-10-CM | POA: Diagnosis not present

## 2012-03-26 DIAGNOSIS — I712 Thoracic aortic aneurysm, without rupture, unspecified: Secondary | ICD-10-CM

## 2012-03-26 DIAGNOSIS — I359 Nonrheumatic aortic valve disorder, unspecified: Secondary | ICD-10-CM | POA: Diagnosis not present

## 2012-03-29 ENCOUNTER — Encounter: Payer: Self-pay | Admitting: Internal Medicine

## 2012-03-29 ENCOUNTER — Ambulatory Visit (INDEPENDENT_AMBULATORY_CARE_PROVIDER_SITE_OTHER): Payer: Medicare Other | Admitting: Internal Medicine

## 2012-03-29 VITALS — BP 134/84 | HR 91 | Temp 97.5°F | Ht 72.0 in | Wt 173.5 lb

## 2012-03-29 DIAGNOSIS — J069 Acute upper respiratory infection, unspecified: Secondary | ICD-10-CM

## 2012-03-29 NOTE — Progress Notes (Signed)
HPI  Pt presents to the clinic today with c/o cold symptoms x 2 weeks. The worst part is the dry cough. He does not produce any sputum. He was running fevers last week but none this week. He has not taken anything OTC.  He does have sick contacts.  Review of Systems      Past Medical History  Diagnosis Date  . Anticoagulant long-term use   . BPH (benign prostatic hyperplasia)   . 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   Last echo 02/2009: EF 55-60%, Normal appearing "tissue valve" with trivial periprosthetic AR, mild MR, mild LAE  . HTN (hypertension)   . Migraine headache   . S/P cardiac cath     [facility] in 2003 demonstrated normal coronary arteries  . Bradycardia     Beta blocker discontinued    Family History  Problem Relation Age of Onset  . Hypertension Father   . Hyperlipidemia Father   . Cancer Mother     ovarian    History   Social History  . Marital Status: Married    Spouse Name: N/A    Number of Children: N/A  . Years of Education: N/A   Occupational History  . Not on file.   Social History Main Topics  . Smoking status: Former Smoker    Quit date: 08/08/1975  . Smokeless tobacco: Not on file  . Alcohol Use: Yes  . Drug Use: No  . Sexually Active: Yes   Other Topics Concern  . Not on file   Social History Narrative  . No narrative on file    Allergies  Allergen Reactions  . Codeine     rash     Constitutional: Positive headache. Denies fatigue, fever or abrupt weight changes.  HEENT:  Denies eye redness, eye pain, pressure behind the eyes, facial pain, nasal congestion, ear pain, ringing in the ears, wax buildup, runny nose or bloody nose. Respiratory: Positive cough. Denies difficulty breathing or shortness of breath.  Cardiovascular: Denies chest pain, chest tightness, palpitations or swelling in the hands or feet.   No other specific complaints in a complete review of systems  (except as listed in HPI above).  Objective:   BP 134/84  Pulse 91  Temp 97.5 F (36.4 C) (Oral)  Ht 6' (1.829 m)  Wt 173 lb 8 oz (78.699 kg)  BMI 23.53 kg/m2  SpO2 98% Wt Readings from Last 3 Encounters:  03/29/12 173 lb 8 oz (78.699 kg)  03/03/12 170 lb (77.111 kg)  03/03/12 170 lb (77.111 kg)     General: Appears his stated age, well developed, well nourished in NAD. HEENT: Head: normal shape and size; Eyes: sclera white, no icterus, conjunctiva pink, PERRLA and EOMs intact; Ears: Tm's gray and intact, normal light reflex; Nose: mucosa pink and moist, septum midline; Throat/Mouth: + PND. Teeth present, mucosa erythematous and moist, no exudate noted, no lesions or ulcerations noted.  Neck: Neck supple, trachea midline. No massses, lumps or thyromegaly present.  Cardiovascular: Normal rate and rhythm. S1,S2 noted.  No murmur, rubs or gallops noted. No JVD or BLE edema. No carotid bruits noted. Pulmonary/Chest: Normal effort and positive vesicular breath sounds. No respiratory distress. No wheezes, rales or ronchi noted.      Assessment & Plan:   Upper Respiratory Infection, new onset with additional workup required:  Get some rest and drink plenty of water Do salt water gargles for  the sore throat Continue tussi- caps Get OTC delsym as needed  RTC as needed or if symptoms persist.

## 2012-03-29 NOTE — Patient Instructions (Signed)

## 2012-03-30 ENCOUNTER — Encounter (INDEPENDENT_AMBULATORY_CARE_PROVIDER_SITE_OTHER): Payer: Self-pay | Admitting: General Surgery

## 2012-03-30 ENCOUNTER — Ambulatory Visit (INDEPENDENT_AMBULATORY_CARE_PROVIDER_SITE_OTHER): Payer: Medicare Other | Admitting: General Surgery

## 2012-03-30 ENCOUNTER — Ambulatory Visit: Payer: Medicare Other | Admitting: Cardiology

## 2012-03-30 VITALS — BP 122/88 | HR 57 | Temp 97.0°F | Resp 16 | Ht 72.0 in | Wt 174.6 lb

## 2012-03-30 DIAGNOSIS — D179 Benign lipomatous neoplasm, unspecified: Secondary | ICD-10-CM

## 2012-03-30 NOTE — Patient Instructions (Signed)
May return to all normal activities 

## 2012-03-30 NOTE — Progress Notes (Signed)
Subjective:     Patient ID: Patrick [name], male   DOB: November 02, 1940, 72 y.o.   MRN: [id number]  HPI The patient is a 72 year old white male who is about 3 weeks status post excision of a cyst from the left jaw and lipoma from the back. He has done well. He has no complaints today. He denies any pain at either of the operative areas.  Review of Systems     Objective:   Physical Exam On exam both incisions are healing nicely with no evidence of infection or seroma    Assessment:     3 weeks status post excision of cyst and lipoma    Plan:     At this point he may return to all his normal activities without any restrictions. We will plan to see him back on a when necessary basis

## 2012-04-07 ENCOUNTER — Ambulatory Visit (INDEPENDENT_AMBULATORY_CARE_PROVIDER_SITE_OTHER): Payer: Medicare Other | Admitting: Pharmacist

## 2012-04-07 DIAGNOSIS — I712 Thoracic aortic aneurysm, without rupture, unspecified: Secondary | ICD-10-CM | POA: Diagnosis not present

## 2012-04-07 DIAGNOSIS — Z7901 Long term (current) use of anticoagulants: Secondary | ICD-10-CM

## 2012-04-07 DIAGNOSIS — I359 Nonrheumatic aortic valve disorder, unspecified: Secondary | ICD-10-CM

## 2012-04-07 LAB — POCT INR: INR: 2.8

## 2012-05-03 ENCOUNTER — Ambulatory Visit (INDEPENDENT_AMBULATORY_CARE_PROVIDER_SITE_OTHER): Payer: Medicare Other

## 2012-05-03 DIAGNOSIS — I712 Thoracic aortic aneurysm, without rupture, unspecified: Secondary | ICD-10-CM | POA: Diagnosis not present

## 2012-05-03 DIAGNOSIS — I359 Nonrheumatic aortic valve disorder, unspecified: Secondary | ICD-10-CM

## 2012-05-03 DIAGNOSIS — Z7901 Long term (current) use of anticoagulants: Secondary | ICD-10-CM

## 2012-05-04 ENCOUNTER — Encounter: Payer: Self-pay | Admitting: Internal Medicine

## 2012-05-04 ENCOUNTER — Ambulatory Visit (INDEPENDENT_AMBULATORY_CARE_PROVIDER_SITE_OTHER): Payer: Medicare Other | Admitting: Internal Medicine

## 2012-05-04 VITALS — BP 120/70 | HR 72 | Temp 97.2°F | Resp 16 | Wt 174.0 lb

## 2012-05-04 DIAGNOSIS — M545 Low back pain, unspecified: Secondary | ICD-10-CM | POA: Diagnosis not present

## 2012-05-04 NOTE — Assessment & Plan Note (Signed)
R SI area MSK pain - better Stretch, use heat and a spiky ball for massage Naproxen prn w/caution

## 2012-05-04 NOTE — Patient Instructions (Addendum)
Masage, heat, stretching

## 2012-05-04 NOTE — Progress Notes (Signed)
Subjective:     Hip Pain  The incident occurred 2 days ago. The incident occurred in the yard. There was no injury mechanism. The pain is present in the right hip (R sacral area). The quality of the pain is described as burning. The pain is severe. The pain has been improving (resolved after 2 Aleves) since onset. He has tried NSAIDs for the symptoms. The treatment provided significant relief.  Back Pain This is a recurrent problem. The problem occurs 2 to 4 times per day. Associated symptoms include headaches. Pertinent negatives include no chest pain, dysuria or weakness. He has tried NSAIDs for the symptoms. The treatment provided significant relief.      Review of Systems  Constitutional: Negative for appetite change, fatigue and unexpected weight change.  HENT: Negative for nosebleeds, congestion, sore throat, sneezing, trouble swallowing and neck pain.   Eyes: Negative for itching and visual disturbance.  Respiratory: Negative for cough.   Cardiovascular: Negative for chest pain, palpitations and leg swelling.  Gastrointestinal: Negative for nausea, diarrhea, blood in stool and abdominal distention.  Genitourinary: Negative for dysuria, urgency, frequency, hematuria and enuresis.  Musculoskeletal: Positive for back pain. Negative for joint swelling and gait problem.  Skin: Negative for rash.  Neurological: Positive for headaches. Negative for dizziness, tremors, speech difficulty, weakness and light-headedness.  Psychiatric/Behavioral: Negative for suicidal ideas, sleep disturbance, dysphoric mood and agitation. The patient is not nervous/anxious.     Wt Readings from Last 3 Encounters:  05/04/12 174 lb (78.926 kg)  03/30/12 174 lb 9.6 oz (79.198 kg)  03/29/12 173 lb 8 oz (78.699 kg)   BP Readings from Last 3 Encounters:  05/04/12 120/70  03/30/12 122/88  03/29/12 134/84        Objective:   Physical Exam  Constitutional: He is oriented to person, place, and time. He  appears well-developed and well-nourished.  HENT:  Mouth/Throat: Oropharynx is clear and moist.  Eyes: Conjunctivae are normal. Pupils are equal, round, and reactive to light.  Neck: Normal range of motion. No JVD present. No thyromegaly present.  Cardiovascular: Normal rate, regular rhythm, normal heart sounds and intact distal pulses.  Exam reveals no gallop and no friction rub.   No murmur heard. Pulmonary/Chest: Effort normal and breath sounds normal. No respiratory distress. He has no wheezes. He has no rales. He exhibits no tenderness.  Abdominal: Soft. Bowel sounds are normal. He exhibits no distension and no mass. There is no tenderness. There is no rebound and no guarding.  Musculoskeletal: Normal range of motion. He exhibits tenderness. He exhibits no edema.  L dist deltoid is tender  Lymphadenopathy:    He has no cervical adenopathy.  Neurological: He is alert and oriented to person, place, and time. He has normal reflexes. No cranial nerve deficit. He exhibits normal muscle tone. Coordination normal.  Skin: Skin is warm and dry. No rash noted.  Psychiatric: He has a normal mood and affect. His behavior is normal. Judgment and thought content normal.  L scap area oval 6x5 cm swelling, NT, small swelling L posterior knee Lab Results  Component Value Date   WBC 6.9 02/26/2012   HGB 16.3 02/26/2012   HCT 45.6 02/26/2012   PLT 183 02/26/2012   GLUCOSE 86 02/26/2012   CHOL 144 03/26/2011   TRIG 87.0 03/26/2011   HDL 48.90 03/26/2011   LDLCALC 78 03/26/2011   ALT 33 02/26/2012   AST 35 02/26/2012   NA 140 02/26/2012   K 4.5 02/26/2012  CL 105 02/26/2012   CREATININE 1.06 02/26/2012   BUN 17 02/26/2012   CO2 27 02/26/2012   TSH 0.54 03/26/2011   PSA 2.27 09/18/2009   INR 2.3 05/03/2012   HGBA1C 5.4 03/05/2009          Assessment & Plan:

## 2012-05-18 ENCOUNTER — Other Ambulatory Visit: Payer: Self-pay | Admitting: Internal Medicine

## 2012-05-24 ENCOUNTER — Encounter: Payer: Self-pay | Admitting: Cardiology

## 2012-05-24 ENCOUNTER — Ambulatory Visit (INDEPENDENT_AMBULATORY_CARE_PROVIDER_SITE_OTHER): Payer: Medicare Other | Admitting: Cardiology

## 2012-05-24 VITALS — BP 110/70 | HR 66 | Ht 72.0 in | Wt 173.0 lb

## 2012-05-24 DIAGNOSIS — Z954 Presence of other heart-valve replacement: Secondary | ICD-10-CM

## 2012-05-24 DIAGNOSIS — I712 Thoracic aortic aneurysm, without rupture, unspecified: Secondary | ICD-10-CM

## 2012-05-24 NOTE — Assessment & Plan Note (Signed)
He is status post repair and aortic valve replacement in 2003. His last echocardiogram was in 2011. He attributes aortic insufficiency then with mild mitral regurgitation. We'll followup echocardiogram. I'll schedule him for followup in a year with Dr.McAlhaney.

## 2012-05-24 NOTE — Progress Notes (Signed)
HPI Patrick [name] comes in today for evaluation and management of his history of aortic valve replacement and aneurysm repair in 2003. He has done remarkably well. He denies any symptoms of chest pain, angina, palpitations, presyncope, shortness of breath, or dyspnea on exertion.  Past Medical History  Diagnosis Date  . Anticoagulant long-term use   . BPH (benign prostatic hyperplasia)   . 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   Last echo 02/2009: EF 55-60%, Normal appearing "tissue valve" with trivial periprosthetic AR, mild MR, mild LAE  . HTN (hypertension)   . Migraine headache   . S/P cardiac cath     [facility] in 2003 demonstrated normal coronary arteries  . Bradycardia     Beta blocker discontinued    Current Outpatient Prescriptions  Medication Sig Dispense Refill  . amLODipine (NORVASC) 5 MG tablet Take 1 tablet (5 mg total) by mouth daily.  180 tablet  3  . amoxicillin (AMOXIL) 500 MG capsule Take 4 caps 1 h prior to dental appointment  20 capsule  0  . atorvastatin (LIPITOR) 10 MG tablet take 1 tablet by mouth once daily  30 tablet  6  . ciprofloxacin (CIPRO) 500 MG tablet Take 500 mg by mouth 2 (two) times daily. FOR TRAVEL USE WHEN GOING OUT OF THE COUNTRY      . desonide (DESOWEN) 0.05 % cream Apply 1 application topically daily as needed.  60 g  3  . dutasteride (AVODART) 0.5 MG capsule Take 0.5 mg by mouth daily.        Marland Kitchen itraconazole (SPORANOX) 100 MG capsule Take 200 mg by mouth every evening. Take for 7 days      . SUMAtriptan (IMITREX) 50 MG tablet Take 0.5 tablets (25 mg total) by mouth every 2 (two) hours as needed.  10 tablet  5  . warfarin (COUMADIN) 5 MG tablet take 1 tablet by mouth once daily as directed  135 tablet  3   No current facility-administered medications for this visit.    Allergies  Allergen Reactions  . Codeine     rash    Family History  Problem Relation Age of Onset  .  Hypertension Father   . Hyperlipidemia Father   . Cancer Mother     ovarian    History   Social History  . Marital Status: Married    Spouse Name: N/A    Number of Children: N/A  . Years of Education: N/A   Occupational History  . Not on file.   Social History Main Topics  . Smoking status: Former Smoker    Quit date: 08/08/1975  . Smokeless tobacco: Not on file  . Alcohol Use: Yes  . Drug Use: No  . Sexually Active: Yes   Other Topics Concern  . Not on file   Social History Narrative  . No narrative on file    ROS ALL NEGATIVE EXCEPT THOSE NOTED IN HPI  PE  General Appearance: well developed, well nourished in no acute distress HEENT: symmetrical face, PERRLA, good dentition  Neck: no JVD, thyromegaly, or adenopathy, trachea midline Chest: symmetric without deformity Cardiac: PMI non-displaced, RRR, normal S1, prosthetic S2, no gallop, soft systolic murmur muscle border Lung: clear to ausculation and percussion Vascular: all pulses full without bruits  Abdominal: nondistended, nontender, good bowel sounds, no HSM, no bruits Extremities: no cyanosis, clubbing or edema, no sign of DVT, no varicosities  Skin:  normal color, no rashes Neuro: alert and oriented x 3, non-focal Pysch: normal affect  EKG Not repeated  BMET    Component Value Date/Time   NA 140 02/26/2012 1100   K 4.5 02/26/2012 1100   CL 105 02/26/2012 1100   CO2 27 02/26/2012 1100   GLUCOSE 86 02/26/2012 1100   GLUCOSE 96 01/12/2006 1138   BUN 17 02/26/2012 1100   CREATININE 1.06 02/26/2012 1100   CALCIUM 9.7 02/26/2012 1100   GFRNONAA 70* 02/26/2012 1100   GFRAA 81* 02/26/2012 1100    Lipid Panel     Component Value Date/Time   CHOL 144 03/26/2011 1126   TRIG 87.0 03/26/2011 1126   HDL 48.90 03/26/2011 1126   CHOLHDL 3 03/26/2011 1126   VLDL 17.4 03/26/2011 1126   LDLCALC 78 03/26/2011 1126    CBC    Component Value Date/Time   WBC 6.9 02/26/2012 1100   RBC 5.12 02/26/2012 1100   HGB 16.3 02/26/2012 1100    HCT 45.6 02/26/2012 1100   PLT 183 02/26/2012 1100   MCV 89.1 02/26/2012 1100   MCH 31.8 02/26/2012 1100   MCHC 35.7 02/26/2012 1100   RDW 12.7 02/26/2012 1100   LYMPHSABS 0.6* 03/26/2011 1126   MONOABS 0.9 03/26/2011 1126   EOSABS 0.1 03/26/2011 1126   BASOSABS 0.0 03/26/2011 1126

## 2012-05-24 NOTE — Patient Instructions (Addendum)
Your physician recommends that you continue on your current medications as directed. Please refer to the Current Medication list given to you today.  Your physician has requested that you have an echocardiogram. Echocardiography is a painless test that uses sound waves to create images of your heart. It provides your doctor with information about the size and shape of your heart and how well your heart's chambers and valves are working. This procedure takes approximately one hour. There are no restrictions for this procedure.   Your physician wants you to follow-up in: 1 year You will receive a reminder letter in the mail two months in advance. If you don't receive a letter, please call our office to schedule the follow-up appointment.   

## 2012-06-07 ENCOUNTER — Encounter: Payer: Self-pay | Admitting: Cardiology

## 2012-06-07 ENCOUNTER — Ambulatory Visit (INDEPENDENT_AMBULATORY_CARE_PROVIDER_SITE_OTHER): Payer: Medicare Other | Admitting: Pharmacist

## 2012-06-07 DIAGNOSIS — I712 Thoracic aortic aneurysm, without rupture, unspecified: Secondary | ICD-10-CM

## 2012-06-07 DIAGNOSIS — I359 Nonrheumatic aortic valve disorder, unspecified: Secondary | ICD-10-CM

## 2012-06-07 DIAGNOSIS — Z7901 Long term (current) use of anticoagulants: Secondary | ICD-10-CM | POA: Diagnosis not present

## 2012-06-07 LAB — POCT INR: INR: 2.3

## 2012-06-21 ENCOUNTER — Encounter: Payer: Self-pay | Admitting: Internal Medicine

## 2012-06-21 ENCOUNTER — Ambulatory Visit (INDEPENDENT_AMBULATORY_CARE_PROVIDER_SITE_OTHER): Payer: Medicare Other | Admitting: Internal Medicine

## 2012-06-21 ENCOUNTER — Ambulatory Visit (INDEPENDENT_AMBULATORY_CARE_PROVIDER_SITE_OTHER)
Admission: RE | Admit: 2012-06-21 | Discharge: 2012-06-21 | Disposition: A | Discharge status: Home / Self Care | Payer: Medicare Other | Source: Ambulatory Visit | Attending: Internal Medicine | Admitting: Internal Medicine

## 2012-06-21 VITALS — BP 120/64 | HR 64 | Temp 98.6°F | Resp 16 | Wt 172.2 lb

## 2012-06-21 DIAGNOSIS — M25559 Pain in unspecified hip: Secondary | ICD-10-CM

## 2012-06-21 DIAGNOSIS — M545 Low back pain, unspecified: Secondary | ICD-10-CM

## 2012-06-21 DIAGNOSIS — M25552 Pain in left hip: Secondary | ICD-10-CM

## 2012-06-21 DIAGNOSIS — R937 Abnormal findings on diagnostic imaging of other parts of musculoskeletal system: Secondary | ICD-10-CM | POA: Insufficient documentation

## 2012-06-21 DIAGNOSIS — M1610 Unilateral primary osteoarthritis, unspecified hip: Secondary | ICD-10-CM | POA: Diagnosis not present

## 2012-06-21 DIAGNOSIS — M169 Osteoarthritis of hip, unspecified: Secondary | ICD-10-CM | POA: Diagnosis not present

## 2012-06-21 DIAGNOSIS — M5416 Radiculopathy, lumbar region: Secondary | ICD-10-CM | POA: Insufficient documentation

## 2012-06-21 DIAGNOSIS — M47817 Spondylosis without myelopathy or radiculopathy, lumbosacral region: Secondary | ICD-10-CM | POA: Diagnosis not present

## 2012-06-21 MED ORDER — OXYCODONE-ACETAMINOPHEN 7.5-325 MG PO TABS: 1.0000 | ORAL_TABLET | Freq: Four times a day (QID) | ORAL | Status: DC | PRN

## 2012-06-21 NOTE — Progress Notes (Signed)
Subjective:    Patient ID: Patrick [name], male    DOB: 04/14/1940, 72 y.o.   MRN: [id number]  Back Pain This is a recurrent problem. The current episode started more than 1 month ago. The problem occurs constantly. The problem has been gradually worsening since onset. The pain is present in the lumbar spine. The quality of the pain is described as aching. The pain radiates to the left thigh. The pain is at a severity of 6/10. The pain is moderate. The pain is worse during the night. The symptoms are aggravated by bending, position and standing. Stiffness is present at night. Associated symptoms include leg pain (left hip and left thigh). Pertinent negatives include no abdominal pain, bladder incontinence, bowel incontinence, chest pain, dysuria, fever, headaches, numbness, paresis, paresthesias, pelvic pain, perianal numbness, tingling, weakness or weight loss. He has tried walking (tramadol, apap, ibuprofen, hydrocodone) for the symptoms. The treatment provided no relief.      Review of Systems  Constitutional: Negative.  Negative for fever, chills, weight loss, diaphoresis, activity change, appetite change, fatigue and unexpected weight change.  HENT: Negative.   Eyes: Negative.   Respiratory: Negative.  Negative for cough, chest tightness, shortness of breath, wheezing and stridor.   Cardiovascular: Negative.  Negative for chest pain, palpitations and leg swelling.  Gastrointestinal: Negative.  Negative for nausea, vomiting, abdominal pain, diarrhea, constipation and bowel incontinence.  Endocrine: Negative.   Genitourinary: Negative.  Negative for bladder incontinence, dysuria and pelvic pain.  Musculoskeletal: Positive for back pain and arthralgias (left hip). Negative for myalgias, joint swelling and gait problem.  Skin: Negative.  Negative for color change, pallor, rash and wound.  Allergic/Immunologic: Negative.   Neurological: Negative.  Negative for dizziness, tingling, tremors,  seizures, syncope, facial asymmetry, speech difficulty, weakness, light-headedness, numbness, headaches and paresthesias.  Hematological: Negative.  Negative for adenopathy. Does not bruise/bleed easily.  Psychiatric/Behavioral: Negative.        Objective:   Physical Exam  Vitals reviewed. Constitutional: He is oriented to person, place, and time. He appears well-developed and well-nourished. No distress.  HENT:  Head: Normocephalic and atraumatic.  Mouth/Throat: Oropharynx is clear and moist.  Eyes: Conjunctivae are normal. Right eye exhibits no discharge. Left eye exhibits no discharge. No scleral icterus.  Neck: Normal range of motion. Neck supple. No JVD present. No tracheal deviation present. No thyromegaly present.  Cardiovascular: Normal rate, regular rhythm, normal heart sounds and intact distal pulses.  Exam reveals no gallop.   No murmur heard. Pulmonary/Chest: Effort normal and breath sounds normal. No stridor. No respiratory distress. He has no wheezes. He has no rales. He exhibits no tenderness.  Abdominal: Soft. Bowel sounds are normal. He exhibits no distension and no mass. There is no tenderness. There is no rebound and no guarding.  Musculoskeletal: Normal range of motion. He exhibits no edema and no tenderness.       Left hip: Normal. He exhibits normal range of motion, normal strength, no tenderness, no bony tenderness, no swelling, no crepitus, no deformity and no laceration.       Lumbar back: Normal. He exhibits normal range of motion, no tenderness, no bony tenderness, no swelling, no edema, no deformity, no laceration, no pain, no spasm and normal pulse.  Lymphadenopathy:    He has no cervical adenopathy.  Neurological: He is alert and oriented to person, place, and time. He has normal strength. He displays abnormal reflex. He displays no atrophy and no tremor. No cranial nerve deficit or sensory  deficit. He exhibits normal muscle tone. He displays a negative Romberg  sign. He displays no seizure activity. Coordination and gait normal. He displays no Babinski's sign on the right side. He displays no Babinski's sign on the left side.  Reflex Scores:      Tricep reflexes are 1+ on the right side and 1+ on the left side.      Bicep reflexes are 1+ on the right side and 1+ on the left side.      Brachioradialis reflexes are 1+ on the right side and 1+ on the left side.      Patellar reflexes are 1+ on the right side and 0 on the left side.      Achilles reflexes are 1+ on the right side and 0 on the left side. Pos SLR in LLE  Skin: Skin is warm and dry. No rash noted. He is not diaphoretic. No erythema. No pallor.  Psychiatric: He has a normal mood and affect. His behavior is normal. Judgment and thought content normal.      Lab Results  Component Value Date   WBC 6.9 02/26/2012   HGB 16.3 02/26/2012   HCT 45.6 02/26/2012   PLT 183 02/26/2012   GLUCOSE 86 02/26/2012   CHOL 144 03/26/2011   TRIG 87.0 03/26/2011   HDL 48.90 03/26/2011   LDLCALC 78 03/26/2011   ALT 33 02/26/2012   AST 35 02/26/2012   NA 140 02/26/2012   K 4.5 02/26/2012   CL 105 02/26/2012   CREATININE 1.06 02/26/2012   BUN 17 02/26/2012   CO2 27 02/26/2012   TSH 0.54 03/26/2011   PSA 2.27 09/18/2009   INR 2.3 06/07/2012   HGBA1C 5.4 03/05/2009  Dg Lumbar Spine Complete  06/21/2012  *RADIOLOGY REPORT*  Clinical Data: Left lower back and leg pain.  LUMBAR SPINE - COMPLETE 4+ VIEW  Comparison: None.  Findings: Five non-rib bearing lumbar vertebrae.  Minimal dextroconvex scoliosis.  Disc space narrowing and anterior lateral spur formation throughout the lumbar and lower thoracic spine.  No fractures, pars defects or subluxations.  Facet degenerative changes throughout the lumbar spine.  IMPRESSION: Degenerative changes, as described above.   Original Report Authenticated By: Beckie Salts, M.D.    Dg Hip Complete Left  06/21/2012  *RADIOLOGY REPORT*  Clinical Data:  left hip pain, no known injury  LEFT HIP - COMPLETE  2+ VIEW  Comparison: None.  Findings: Three views of the left hip submitted.  No acute fracture or subluxation.  Mild degenerative changes bilateral hip joint with superior acetabular spurring.  IMPRESSION: No acute fracture or subluxation.  Mild degenerative changes bilateral hip joints.   Original Report Authenticated By: Natasha Mead, M.D.       Assessment & Plan:

## 2012-06-21 NOTE — Patient Instructions (Signed)
Back Pain, Adult Low back pain is very common. About 1 in 5 people have back pain.The cause of low back pain is rarely dangerous. The pain often gets better over time.About half of people with a sudden onset of back pain feel better in just 2 weeks. About 8 in 10 people feel better by 6 weeks.  CAUSES Some common causes of back pain include:  Strain of the muscles or ligaments supporting the spine.  Wear and tear (degeneration) of the spinal discs.  Arthritis.  Direct injury to the back. DIAGNOSIS Most of the time, the direct cause of low back pain is not known.However, back pain can be treated effectively even when the exact cause of the pain is unknown.Answering your caregiver's questions about your overall health and symptoms is one of the most accurate ways to make sure the cause of your pain is not dangerous. If your caregiver needs more information, he or she may order lab work or imaging tests (X-rays or MRIs).However, even if imaging tests show changes in your back, this usually does not require surgery. HOME CARE INSTRUCTIONS For many people, back pain returns.Since low back pain is rarely dangerous, it is often a condition that people can learn to manageon their own.   Remain active. It is stressful on the back to sit or stand in one place. Do not sit, drive, or stand in one place for more than 30 minutes at a time. Take short walks on level surfaces as soon as pain allows.Try to increase the length of time you walk each day.  Do not stay in bed.Resting more than 1 or 2 days can delay your recovery.  Do not avoid exercise or work.Your body is made to move.It is not dangerous to be active, even though your back may hurt.Your back will likely heal faster if you return to being active before your pain is gone.  Pay attention to your body when you bend and lift. Many people have less discomfortwhen lifting if they bend their knees, keep the load close to their bodies,and  avoid twisting. Often, the most comfortable positions are those that put less stress on your recovering back.  Find a comfortable position to sleep. Use a firm mattress and lie on your side with your knees slightly bent. If you lie on your back, put a pillow under your knees.  Only take over-the-counter or prescription medicines as directed by your caregiver. Over-the-counter medicines to reduce pain and inflammation are often the most helpful.Your caregiver may prescribe muscle relaxant drugs.These medicines help dull your pain so you can more quickly return to your normal activities and healthy exercise.  Put ice on the injured area.  Put ice in a plastic bag.  Place a towel between your skin and the bag.  Leave the ice on for 15 to 20 minutes, 3 to 4 times a day for the first 2 to 3 days. After that, ice and heat may be alternated to reduce pain and spasms.  Ask your caregiver about trying back exercises and gentle massage. This may be of some benefit.  Avoid feeling anxious or stressed.Stress increases muscle tension and can worsen back pain.It is important to recognize when you are anxious or stressed and learn ways to manage it.Exercise is a great option. SEEK MEDICAL CARE IF:  You have pain that is not relieved with rest or medicine.  You have pain that does not improve in 1 week.  You have new symptoms.  You are generally   not feeling well. SEEK IMMEDIATE MEDICAL CARE IF:   You have pain that radiates from your back into your legs.  You develop new bowel or bladder control problems.  You have unusual weakness or numbness in your arms or legs.  You develop nausea or vomiting.  You develop abdominal pain.  You feel faint. Document Released: 02/10/2005 Document Revised: 08/12/2011 Document Reviewed: 07/01/2010 Exit[employer] Patient Information 2013 [employer].  

## 2012-06-21 NOTE — Assessment & Plan Note (Signed)
He has mild DJD, will try percocet for the pain

## 2012-06-21 NOTE — Assessment & Plan Note (Signed)
He will try percocet for the pain I have ordered an MRI to see if he has spinal stenosis, nerve impingement, spur, tumor, mass, etc

## 2012-06-22 ENCOUNTER — Encounter: Payer: Self-pay | Admitting: Internal Medicine

## 2012-06-22 ENCOUNTER — Telehealth: Payer: Self-pay | Admitting: Cardiology

## 2012-06-22 NOTE — Telephone Encounter (Signed)
New problem    H/O st. Jude aorta valve. 02/2001. Patient does not want to take any chances.    Dr. Yetta Barre is requesting patient to have a MRI.   Patient is having lower back issues.    Please advise

## 2012-06-22 NOTE — Telephone Encounter (Signed)
Called patient back after discussing with Dr.Cooper (DOD). Radiology needs to make decision if MRI is safe because he has a mechanical aortic valve. Spoke with Alycia Rossetti at [facility] at [employer] ( MRI tech) and he advised that the patient call back with his implant card information and he would research this for him. Called patient back and left above information on patient's voice mail.

## 2012-06-23 ENCOUNTER — Other Ambulatory Visit: Payer: Self-pay | Admitting: Internal Medicine

## 2012-06-23 ENCOUNTER — Telehealth: Payer: Self-pay | Admitting: Internal Medicine

## 2012-06-23 DIAGNOSIS — M5416 Radiculopathy, lumbar region: Secondary | ICD-10-CM

## 2012-06-23 DIAGNOSIS — I10 Essential (primary) hypertension: Secondary | ICD-10-CM

## 2012-06-23 DIAGNOSIS — R937 Abnormal findings on diagnostic imaging of other parts of musculoskeletal system: Secondary | ICD-10-CM

## 2012-06-23 NOTE — Telephone Encounter (Signed)
Pt scheduled for  His CT LUMBAR SPINE W CONTRAST on 06-28-2012@1 :00. Pt need an order for BUN & CREATININE THE LAST ONE WAS IN January  PER [facility] TO USE .

## 2012-06-23 NOTE — Telephone Encounter (Signed)
LA - please ask him to come do the required lab work for the CT scan  T. Yetta Barre

## 2012-06-24 ENCOUNTER — Other Ambulatory Visit (INDEPENDENT_AMBULATORY_CARE_PROVIDER_SITE_OTHER): Payer: Medicare Other

## 2012-06-24 ENCOUNTER — Inpatient Hospital Stay: Admission: RE | Admit: 2012-06-24 | Payer: Medicare Other | Source: Ambulatory Visit

## 2012-06-24 DIAGNOSIS — I10 Essential (primary) hypertension: Secondary | ICD-10-CM | POA: Diagnosis not present

## 2012-06-24 LAB — BASIC METABOLIC PANEL
BUN: 20 mg/dL (ref 6–23)
CO2: 29 mEq/L (ref 19–32)
Calcium: 9.4 mg/dL (ref 8.4–10.5)
Creatinine, Ser: 1 mg/dL (ref 0.4–1.5)
Glucose, Bld: 108 mg/dL — ABNORMAL HIGH (ref 70–99)

## 2012-06-24 NOTE — Telephone Encounter (Signed)
LMOVM advising lab work needed and to stop by and have collected.

## 2012-06-28 ENCOUNTER — Encounter: Payer: Self-pay | Admitting: Internal Medicine

## 2012-06-28 ENCOUNTER — Ambulatory Visit (INDEPENDENT_AMBULATORY_CARE_PROVIDER_SITE_OTHER): Payer: Medicare Other

## 2012-06-28 ENCOUNTER — Ambulatory Visit (INDEPENDENT_AMBULATORY_CARE_PROVIDER_SITE_OTHER)
Admission: RE | Admit: 2012-06-28 | Discharge: 2012-06-28 | Disposition: A | Discharge status: Home / Self Care | Payer: Medicare Other | Source: Ambulatory Visit | Attending: Internal Medicine | Admitting: Internal Medicine

## 2012-06-28 ENCOUNTER — Ambulatory Visit (HOSPITAL_COMMUNITY): Payer: Medicare Other | Attending: Cardiology | Admitting: Radiology

## 2012-06-28 DIAGNOSIS — IMO0002 Reserved for concepts with insufficient information to code with codable children: Secondary | ICD-10-CM | POA: Diagnosis not present

## 2012-06-28 DIAGNOSIS — Z7901 Long term (current) use of anticoagulants: Secondary | ICD-10-CM | POA: Diagnosis not present

## 2012-06-28 DIAGNOSIS — I712 Thoracic aortic aneurysm, without rupture, unspecified: Secondary | ICD-10-CM | POA: Diagnosis not present

## 2012-06-28 DIAGNOSIS — M48061 Spinal stenosis, lumbar region without neurogenic claudication: Secondary | ICD-10-CM | POA: Diagnosis not present

## 2012-06-28 DIAGNOSIS — I10 Essential (primary) hypertension: Secondary | ICD-10-CM | POA: Insufficient documentation

## 2012-06-28 DIAGNOSIS — I359 Nonrheumatic aortic valve disorder, unspecified: Secondary | ICD-10-CM

## 2012-06-28 DIAGNOSIS — M5137 Other intervertebral disc degeneration, lumbosacral region: Secondary | ICD-10-CM | POA: Diagnosis not present

## 2012-06-28 DIAGNOSIS — R937 Abnormal findings on diagnostic imaging of other parts of musculoskeletal system: Secondary | ICD-10-CM

## 2012-06-28 DIAGNOSIS — E785 Hyperlipidemia, unspecified: Secondary | ICD-10-CM | POA: Insufficient documentation

## 2012-06-28 DIAGNOSIS — Z09 Encounter for follow-up examination after completed treatment for conditions other than malignant neoplasm: Secondary | ICD-10-CM | POA: Insufficient documentation

## 2012-06-28 DIAGNOSIS — Z954 Presence of other heart-valve replacement: Secondary | ICD-10-CM | POA: Diagnosis not present

## 2012-06-28 DIAGNOSIS — M5416 Radiculopathy, lumbar region: Secondary | ICD-10-CM

## 2012-06-28 DIAGNOSIS — M545 Low back pain, unspecified: Secondary | ICD-10-CM

## 2012-06-28 DIAGNOSIS — M5126 Other intervertebral disc displacement, lumbar region: Secondary | ICD-10-CM | POA: Diagnosis not present

## 2012-06-28 MED ORDER — IOHEXOL 300 MG/ML  SOLN
100.0000 mL | Freq: Once | INTRAMUSCULAR | Status: AC | PRN
  Administered 2012-06-28: 100 mL via INTRAVENOUS

## 2012-06-28 NOTE — Progress Notes (Signed)
Echocardiogram performed.  

## 2012-06-29 ENCOUNTER — Encounter: Payer: Self-pay | Admitting: Internal Medicine

## 2012-06-29 MED ORDER — PREDNISONE 10 MG PO TABS: ORAL_TABLET | ORAL | Status: DC

## 2012-07-02 ENCOUNTER — Encounter: Payer: Self-pay | Admitting: Internal Medicine

## 2012-07-05 DIAGNOSIS — L570 Actinic keratosis: Secondary | ICD-10-CM | POA: Diagnosis not present

## 2012-07-14 ENCOUNTER — Ambulatory Visit: Payer: Medicare Other | Attending: Internal Medicine | Admitting: Physical Therapy

## 2012-07-14 DIAGNOSIS — M545 Low back pain, unspecified: Secondary | ICD-10-CM | POA: Insufficient documentation

## 2012-07-14 DIAGNOSIS — M25559 Pain in unspecified hip: Secondary | ICD-10-CM | POA: Insufficient documentation

## 2012-07-14 DIAGNOSIS — IMO0001 Reserved for inherently not codable concepts without codable children: Secondary | ICD-10-CM | POA: Diagnosis not present

## 2012-07-16 ENCOUNTER — Ambulatory Visit (INDEPENDENT_AMBULATORY_CARE_PROVIDER_SITE_OTHER): Payer: Medicare Other | Admitting: Pharmacist

## 2012-07-16 DIAGNOSIS — I359 Nonrheumatic aortic valve disorder, unspecified: Secondary | ICD-10-CM

## 2012-07-16 DIAGNOSIS — Z7901 Long term (current) use of anticoagulants: Secondary | ICD-10-CM | POA: Diagnosis not present

## 2012-07-16 DIAGNOSIS — I712 Thoracic aortic aneurysm, without rupture, unspecified: Secondary | ICD-10-CM

## 2012-07-16 LAB — POCT INR: INR: 3

## 2012-07-21 ENCOUNTER — Ambulatory Visit: Payer: Medicare Other | Admitting: Physical Therapy

## 2012-07-21 DIAGNOSIS — M545 Low back pain, unspecified: Secondary | ICD-10-CM | POA: Diagnosis not present

## 2012-07-21 DIAGNOSIS — M25559 Pain in unspecified hip: Secondary | ICD-10-CM | POA: Diagnosis not present

## 2012-07-21 DIAGNOSIS — IMO0001 Reserved for inherently not codable concepts without codable children: Secondary | ICD-10-CM | POA: Diagnosis not present

## 2012-07-28 ENCOUNTER — Ambulatory Visit: Payer: Medicare Other | Attending: Internal Medicine | Admitting: Physical Therapy

## 2012-07-28 DIAGNOSIS — M545 Low back pain, unspecified: Secondary | ICD-10-CM | POA: Insufficient documentation

## 2012-07-28 DIAGNOSIS — M25559 Pain in unspecified hip: Secondary | ICD-10-CM | POA: Diagnosis not present

## 2012-07-28 DIAGNOSIS — IMO0001 Reserved for inherently not codable concepts without codable children: Secondary | ICD-10-CM | POA: Insufficient documentation

## 2012-08-04 ENCOUNTER — Ambulatory Visit: Payer: Medicare Other | Admitting: Physical Therapy

## 2012-08-06 ENCOUNTER — Other Ambulatory Visit: Payer: Self-pay | Admitting: Dermatology

## 2012-08-06 DIAGNOSIS — D485 Neoplasm of uncertain behavior of skin: Secondary | ICD-10-CM | POA: Diagnosis not present

## 2012-08-06 DIAGNOSIS — W57XXXA Bitten or stung by nonvenomous insect and other nonvenomous arthropods, initial encounter: Secondary | ICD-10-CM | POA: Diagnosis not present

## 2012-08-06 DIAGNOSIS — L570 Actinic keratosis: Secondary | ICD-10-CM | POA: Diagnosis not present

## 2012-08-06 DIAGNOSIS — T148 Other injury of unspecified body region: Secondary | ICD-10-CM | POA: Diagnosis not present

## 2012-08-06 DIAGNOSIS — Z85828 Personal history of other malignant neoplasm of skin: Secondary | ICD-10-CM | POA: Diagnosis not present

## 2012-08-06 DIAGNOSIS — L821 Other seborrheic keratosis: Secondary | ICD-10-CM | POA: Diagnosis not present

## 2012-08-11 ENCOUNTER — Ambulatory Visit: Payer: Medicare Other | Admitting: Physical Therapy

## 2012-08-13 ENCOUNTER — Ambulatory Visit (INDEPENDENT_AMBULATORY_CARE_PROVIDER_SITE_OTHER): Payer: Medicare Other | Admitting: Pharmacist

## 2012-08-13 DIAGNOSIS — Z7901 Long term (current) use of anticoagulants: Secondary | ICD-10-CM

## 2012-08-13 DIAGNOSIS — I712 Thoracic aortic aneurysm, without rupture, unspecified: Secondary | ICD-10-CM | POA: Diagnosis not present

## 2012-08-13 DIAGNOSIS — I359 Nonrheumatic aortic valve disorder, unspecified: Secondary | ICD-10-CM | POA: Diagnosis not present

## 2012-08-13 LAB — POCT INR: INR: 2.6

## 2012-08-18 ENCOUNTER — Ambulatory Visit: Payer: Medicare Other | Admitting: Physical Therapy

## 2012-09-24 ENCOUNTER — Ambulatory Visit (INDEPENDENT_AMBULATORY_CARE_PROVIDER_SITE_OTHER): Payer: Medicare Other | Admitting: *Deleted

## 2012-09-24 DIAGNOSIS — Z7901 Long term (current) use of anticoagulants: Secondary | ICD-10-CM | POA: Diagnosis not present

## 2012-09-24 DIAGNOSIS — I712 Thoracic aortic aneurysm, without rupture, unspecified: Secondary | ICD-10-CM

## 2012-09-24 DIAGNOSIS — I359 Nonrheumatic aortic valve disorder, unspecified: Secondary | ICD-10-CM | POA: Diagnosis not present

## 2012-09-29 ENCOUNTER — Other Ambulatory Visit: Payer: Self-pay

## 2012-10-06 ENCOUNTER — Other Ambulatory Visit: Payer: Self-pay | Admitting: Internal Medicine

## 2012-11-05 ENCOUNTER — Ambulatory Visit (INDEPENDENT_AMBULATORY_CARE_PROVIDER_SITE_OTHER): Payer: Medicare Other | Admitting: Pharmacist

## 2012-11-05 DIAGNOSIS — I712 Thoracic aortic aneurysm, without rupture, unspecified: Secondary | ICD-10-CM | POA: Diagnosis not present

## 2012-11-05 DIAGNOSIS — I359 Nonrheumatic aortic valve disorder, unspecified: Secondary | ICD-10-CM

## 2012-11-05 DIAGNOSIS — Z7901 Long term (current) use of anticoagulants: Secondary | ICD-10-CM | POA: Diagnosis not present

## 2012-11-22 ENCOUNTER — Other Ambulatory Visit: Payer: Self-pay | Admitting: Dermatology

## 2012-11-22 ENCOUNTER — Other Ambulatory Visit: Payer: Self-pay | Admitting: Internal Medicine

## 2012-11-22 DIAGNOSIS — Z85828 Personal history of other malignant neoplasm of skin: Secondary | ICD-10-CM | POA: Diagnosis not present

## 2012-11-22 DIAGNOSIS — C44319 Basal cell carcinoma of skin of other parts of face: Secondary | ICD-10-CM | POA: Diagnosis not present

## 2012-11-22 DIAGNOSIS — D485 Neoplasm of uncertain behavior of skin: Secondary | ICD-10-CM | POA: Diagnosis not present

## 2012-11-22 DIAGNOSIS — L219 Seborrheic dermatitis, unspecified: Secondary | ICD-10-CM | POA: Diagnosis not present

## 2012-11-22 DIAGNOSIS — L570 Actinic keratosis: Secondary | ICD-10-CM | POA: Diagnosis not present

## 2012-11-24 ENCOUNTER — Other Ambulatory Visit: Payer: Self-pay | Admitting: Internal Medicine

## 2012-11-26 ENCOUNTER — Ambulatory Visit (INDEPENDENT_AMBULATORY_CARE_PROVIDER_SITE_OTHER): Payer: Medicare Other

## 2012-11-26 ENCOUNTER — Ambulatory Visit (INDEPENDENT_AMBULATORY_CARE_PROVIDER_SITE_OTHER): Payer: Medicare Other | Admitting: *Deleted

## 2012-11-26 DIAGNOSIS — Z7901 Long term (current) use of anticoagulants: Secondary | ICD-10-CM | POA: Diagnosis not present

## 2012-11-26 DIAGNOSIS — I712 Thoracic aortic aneurysm, without rupture, unspecified: Secondary | ICD-10-CM

## 2012-11-26 DIAGNOSIS — Z23 Encounter for immunization: Secondary | ICD-10-CM

## 2012-11-26 DIAGNOSIS — I359 Nonrheumatic aortic valve disorder, unspecified: Secondary | ICD-10-CM

## 2012-11-26 LAB — POCT INR: INR: 2.1

## 2012-11-30 ENCOUNTER — Other Ambulatory Visit: Payer: Self-pay | Admitting: *Deleted

## 2012-11-30 MED ORDER — AMOXICILLIN 500 MG PO CAPS: 500.0000 mg | ORAL_CAPSULE | Freq: Four times a day (QID) | ORAL | Status: DC

## 2012-12-14 ENCOUNTER — Other Ambulatory Visit: Payer: Self-pay | Admitting: Physician Assistant

## 2012-12-17 ENCOUNTER — Ambulatory Visit (INDEPENDENT_AMBULATORY_CARE_PROVIDER_SITE_OTHER): Payer: Medicare Other

## 2012-12-17 ENCOUNTER — Encounter: Payer: Self-pay | Admitting: Podiatrist

## 2012-12-17 ENCOUNTER — Ambulatory Visit (INDEPENDENT_AMBULATORY_CARE_PROVIDER_SITE_OTHER): Payer: Medicare Other | Admitting: Podiatrist

## 2012-12-17 VITALS — BP 141/74 | HR 58 | Resp 16 | Ht 72.0 in | Wt 170.0 lb

## 2012-12-17 DIAGNOSIS — R52 Pain, unspecified: Secondary | ICD-10-CM

## 2012-12-17 DIAGNOSIS — L923 Foreign body granuloma of the skin and subcutaneous tissue: Secondary | ICD-10-CM | POA: Diagnosis not present

## 2012-12-17 NOTE — Patient Instructions (Signed)
Soak in epsom salt water.  If the pain continues let us know- it may be a pinched nerve.
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
## 2012-12-24 ENCOUNTER — Ambulatory Visit (INDEPENDENT_AMBULATORY_CARE_PROVIDER_SITE_OTHER): Payer: Medicare Other | Admitting: General Practice

## 2012-12-24 DIAGNOSIS — I359 Nonrheumatic aortic valve disorder, unspecified: Secondary | ICD-10-CM | POA: Diagnosis not present

## 2012-12-24 DIAGNOSIS — I712 Thoracic aortic aneurysm, without rupture, unspecified: Secondary | ICD-10-CM | POA: Diagnosis not present

## 2012-12-24 DIAGNOSIS — Z7901 Long term (current) use of anticoagulants: Secondary | ICD-10-CM

## 2012-12-24 LAB — POCT INR: INR: 2.4

## 2013-01-03 DIAGNOSIS — C44319 Basal cell carcinoma of skin of other parts of face: Secondary | ICD-10-CM | POA: Diagnosis not present

## 2013-01-03 DIAGNOSIS — Z85828 Personal history of other malignant neoplasm of skin: Secondary | ICD-10-CM | POA: Diagnosis not present

## 2013-01-28 ENCOUNTER — Ambulatory Visit (INDEPENDENT_AMBULATORY_CARE_PROVIDER_SITE_OTHER): Payer: Medicare Other | Admitting: *Deleted

## 2013-01-28 DIAGNOSIS — I712 Thoracic aortic aneurysm, without rupture, unspecified: Secondary | ICD-10-CM | POA: Diagnosis not present

## 2013-01-28 DIAGNOSIS — Z7901 Long term (current) use of anticoagulants: Secondary | ICD-10-CM

## 2013-01-28 DIAGNOSIS — I359 Nonrheumatic aortic valve disorder, unspecified: Secondary | ICD-10-CM | POA: Diagnosis not present

## 2013-02-04 ENCOUNTER — Encounter: Payer: Self-pay | Admitting: Gastroenterology

## 2013-02-11 ENCOUNTER — Ambulatory Visit (INDEPENDENT_AMBULATORY_CARE_PROVIDER_SITE_OTHER): Payer: Medicare Other | Admitting: *Deleted

## 2013-02-11 DIAGNOSIS — I359 Nonrheumatic aortic valve disorder, unspecified: Secondary | ICD-10-CM

## 2013-02-11 DIAGNOSIS — I712 Thoracic aortic aneurysm, without rupture, unspecified: Secondary | ICD-10-CM

## 2013-02-11 DIAGNOSIS — Z7901 Long term (current) use of anticoagulants: Secondary | ICD-10-CM

## 2013-02-11 LAB — POCT INR: INR: 2.4

## 2013-03-04 DIAGNOSIS — N529 Male erectile dysfunction, unspecified: Secondary | ICD-10-CM | POA: Diagnosis not present

## 2013-03-04 DIAGNOSIS — R39198 Other difficulties with micturition: Secondary | ICD-10-CM | POA: Diagnosis not present

## 2013-03-04 DIAGNOSIS — Z125 Encounter for screening for malignant neoplasm of prostate: Secondary | ICD-10-CM | POA: Diagnosis not present

## 2013-03-04 DIAGNOSIS — N400 Enlarged prostate without lower urinary tract symptoms: Secondary | ICD-10-CM | POA: Diagnosis not present

## 2013-03-11 ENCOUNTER — Ambulatory Visit (INDEPENDENT_AMBULATORY_CARE_PROVIDER_SITE_OTHER): Payer: Medicare Other | Admitting: *Deleted

## 2013-03-11 DIAGNOSIS — I712 Thoracic aortic aneurysm, without rupture, unspecified: Secondary | ICD-10-CM | POA: Diagnosis not present

## 2013-03-11 DIAGNOSIS — I359 Nonrheumatic aortic valve disorder, unspecified: Secondary | ICD-10-CM

## 2013-03-11 DIAGNOSIS — Z7901 Long term (current) use of anticoagulants: Secondary | ICD-10-CM | POA: Diagnosis not present

## 2013-03-11 LAB — POCT INR: INR: 1.5

## 2013-03-30 ENCOUNTER — Other Ambulatory Visit: Payer: Self-pay | Admitting: Pharmacist

## 2013-03-30 ENCOUNTER — Other Ambulatory Visit: Payer: Self-pay | Admitting: General Practice

## 2013-03-30 ENCOUNTER — Ambulatory Visit (INDEPENDENT_AMBULATORY_CARE_PROVIDER_SITE_OTHER): Payer: Medicare Other | Admitting: *Deleted

## 2013-03-30 ENCOUNTER — Other Ambulatory Visit: Payer: Self-pay | Admitting: Cardiovascular Disease

## 2013-03-30 DIAGNOSIS — I359 Nonrheumatic aortic valve disorder, unspecified: Secondary | ICD-10-CM

## 2013-03-30 DIAGNOSIS — I712 Thoracic aortic aneurysm, without rupture, unspecified: Secondary | ICD-10-CM | POA: Diagnosis not present

## 2013-03-30 DIAGNOSIS — Z5181 Encounter for therapeutic drug level monitoring: Secondary | ICD-10-CM | POA: Diagnosis not present

## 2013-03-30 DIAGNOSIS — Z7901 Long term (current) use of anticoagulants: Secondary | ICD-10-CM | POA: Diagnosis not present

## 2013-03-30 LAB — POCT INR: INR: 4.1

## 2013-03-30 MED ORDER — AMLODIPINE BESYLATE 5 MG PO TABS: 5.0000 mg | ORAL_TABLET | Freq: Every day | ORAL | Status: DC

## 2013-03-30 MED ORDER — WARFARIN SODIUM 5 MG PO TABS: ORAL_TABLET | ORAL | Status: DC

## 2013-04-14 ENCOUNTER — Other Ambulatory Visit: Payer: Self-pay | Admitting: Dermatology

## 2013-04-14 ENCOUNTER — Ambulatory Visit (INDEPENDENT_AMBULATORY_CARE_PROVIDER_SITE_OTHER): Payer: Medicare Other

## 2013-04-14 DIAGNOSIS — D485 Neoplasm of uncertain behavior of skin: Secondary | ICD-10-CM | POA: Diagnosis not present

## 2013-04-14 DIAGNOSIS — L723 Sebaceous cyst: Secondary | ICD-10-CM | POA: Diagnosis not present

## 2013-04-14 DIAGNOSIS — L678 Other hair color and hair shaft abnormalities: Secondary | ICD-10-CM | POA: Diagnosis not present

## 2013-04-14 DIAGNOSIS — I712 Thoracic aortic aneurysm, without rupture, unspecified: Secondary | ICD-10-CM

## 2013-04-14 DIAGNOSIS — Z85828 Personal history of other malignant neoplasm of skin: Secondary | ICD-10-CM | POA: Diagnosis not present

## 2013-04-14 DIAGNOSIS — L905 Scar conditions and fibrosis of skin: Secondary | ICD-10-CM | POA: Diagnosis not present

## 2013-04-14 DIAGNOSIS — Z5181 Encounter for therapeutic drug level monitoring: Secondary | ICD-10-CM | POA: Diagnosis not present

## 2013-04-14 DIAGNOSIS — L738 Other specified follicular disorders: Secondary | ICD-10-CM | POA: Diagnosis not present

## 2013-04-14 DIAGNOSIS — Z7901 Long term (current) use of anticoagulants: Secondary | ICD-10-CM | POA: Diagnosis not present

## 2013-04-14 DIAGNOSIS — I359 Nonrheumatic aortic valve disorder, unspecified: Secondary | ICD-10-CM

## 2013-04-14 DIAGNOSIS — L570 Actinic keratosis: Secondary | ICD-10-CM | POA: Diagnosis not present

## 2013-04-14 LAB — POCT INR: INR: 3.4

## 2013-04-28 ENCOUNTER — Encounter: Payer: Self-pay | Admitting: Internal Medicine

## 2013-05-02 ENCOUNTER — Ambulatory Visit (INDEPENDENT_AMBULATORY_CARE_PROVIDER_SITE_OTHER): Payer: Medicare Other

## 2013-05-02 DIAGNOSIS — I712 Thoracic aortic aneurysm, without rupture, unspecified: Secondary | ICD-10-CM | POA: Diagnosis not present

## 2013-05-02 DIAGNOSIS — Z5181 Encounter for therapeutic drug level monitoring: Secondary | ICD-10-CM | POA: Diagnosis not present

## 2013-05-02 DIAGNOSIS — Z7901 Long term (current) use of anticoagulants: Secondary | ICD-10-CM | POA: Diagnosis not present

## 2013-05-02 DIAGNOSIS — I359 Nonrheumatic aortic valve disorder, unspecified: Secondary | ICD-10-CM | POA: Diagnosis not present

## 2013-05-02 LAB — POCT INR: INR: 1.8

## 2013-05-03 ENCOUNTER — Encounter: Payer: Self-pay | Admitting: Cardiovascular Disease

## 2013-05-03 ENCOUNTER — Telehealth: Payer: Self-pay | Admitting: Cardiovascular Disease

## 2013-05-03 ENCOUNTER — Ambulatory Visit (INDEPENDENT_AMBULATORY_CARE_PROVIDER_SITE_OTHER): Payer: Medicare Other | Admitting: Cardiovascular Disease

## 2013-05-03 VITALS — BP 130/88 | HR 57 | Ht 72.0 in | Wt 176.0 lb

## 2013-05-03 DIAGNOSIS — I712 Thoracic aortic aneurysm, without rupture, unspecified: Secondary | ICD-10-CM | POA: Diagnosis not present

## 2013-05-03 DIAGNOSIS — Z7901 Long term (current) use of anticoagulants: Secondary | ICD-10-CM | POA: Diagnosis not present

## 2013-05-03 DIAGNOSIS — I359 Nonrheumatic aortic valve disorder, unspecified: Secondary | ICD-10-CM

## 2013-05-03 DIAGNOSIS — R0989 Other specified symptoms and signs involving the circulatory and respiratory systems: Secondary | ICD-10-CM

## 2013-05-03 DIAGNOSIS — R0600 Dyspnea, unspecified: Secondary | ICD-10-CM

## 2013-05-03 DIAGNOSIS — R0609 Other forms of dyspnea: Secondary | ICD-10-CM

## 2013-05-03 DIAGNOSIS — I10 Essential (primary) hypertension: Secondary | ICD-10-CM | POA: Diagnosis not present

## 2013-05-03 NOTE — Telephone Encounter (Signed)
Per Dr. Angelena Form he suggested Dr. Risa Grill or Dr. Diona Fanti. I spoke with pt and gave him this information

## 2013-05-03 NOTE — Telephone Encounter (Signed)
New message    Patient was seen today was told about 2 MD at [facility]. Patient stated he did not write it down .     Asking to sent information to  my chart.
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
## 2013-05-03 NOTE — Patient Instructions (Signed)
Your physician wants you to follow-up in:  12 months. You will receive a reminder letter in the mail two months in advance. If you don't receive a letter, please call our office to schedule the follow-up appointment.  Your physician has requested that you have an exercise tolerance test. For further information please visit [email or web address]. Please also follow instruction sheet, as given.    

## 2013-05-04 ENCOUNTER — Telehealth (HOSPITAL_COMMUNITY): Payer: Self-pay

## 2013-05-05 ENCOUNTER — Telehealth (HOSPITAL_COMMUNITY): Payer: Self-pay

## 2013-05-06 ENCOUNTER — Ambulatory Visit (HOSPITAL_COMMUNITY)
Admission: RE | Admit: 2013-05-06 | Discharge: 2013-05-06 | Disposition: A | Discharge status: Home / Self Care | Payer: Medicare Other | Source: Ambulatory Visit | Attending: Cardiovascular Disease | Admitting: Cardiovascular Disease

## 2013-05-06 DIAGNOSIS — I359 Nonrheumatic aortic valve disorder, unspecified: Secondary | ICD-10-CM | POA: Insufficient documentation

## 2013-05-09 ENCOUNTER — Other Ambulatory Visit: Payer: Self-pay | Admitting: Internal Medicine

## 2013-05-09 MED ORDER — SILODOSIN 8 MG PO CAPS: 8.0000 mg | ORAL_CAPSULE | Freq: Every day | ORAL | Status: DC

## 2013-05-10 ENCOUNTER — Telehealth: Payer: Self-pay | Admitting: *Deleted

## 2013-05-10 NOTE — Telephone Encounter (Signed)
Note below copied from message from Dr. Angelena Form. I spoke with pt and reviewed results with him.    -------------------------------------------------------------------------------------------------------- Patrick Massed, MD     Sent: Mon May 09, 2013  3:58 PM      To: Thompson Grayer, RN         Message      Stress test normal. Gerald Stabs

## 2013-05-16 NOTE — Telephone Encounter (Signed)
Patient needs his silodosin (RAPAFLO) 8 MG CAPS medication re-sent to [facility] on [address]. It was sent to [facility] in [place].

## 2013-05-19 ENCOUNTER — Other Ambulatory Visit: Payer: Self-pay | Admitting: *Deleted

## 2013-05-19 MED ORDER — SILODOSIN 8 MG PO CAPS: 8.0000 mg | ORAL_CAPSULE | Freq: Every day | ORAL | Status: DC

## 2013-05-22 ENCOUNTER — Encounter: Payer: Self-pay | Admitting: Internal Medicine

## 2013-05-23 ENCOUNTER — Ambulatory Visit (INDEPENDENT_AMBULATORY_CARE_PROVIDER_SITE_OTHER): Payer: Medicare Other | Admitting: Pharmacist

## 2013-05-23 DIAGNOSIS — I712 Thoracic aortic aneurysm, without rupture, unspecified: Secondary | ICD-10-CM | POA: Diagnosis not present

## 2013-05-23 DIAGNOSIS — I359 Nonrheumatic aortic valve disorder, unspecified: Secondary | ICD-10-CM

## 2013-05-23 DIAGNOSIS — Z5181 Encounter for therapeutic drug level monitoring: Secondary | ICD-10-CM | POA: Diagnosis not present

## 2013-05-23 DIAGNOSIS — Z7901 Long term (current) use of anticoagulants: Secondary | ICD-10-CM | POA: Diagnosis not present

## 2013-05-23 LAB — POCT INR: INR: 1.7

## 2013-05-24 ENCOUNTER — Encounter: Payer: Self-pay | Admitting: Internal Medicine

## 2013-05-25 ENCOUNTER — Other Ambulatory Visit: Payer: Self-pay | Admitting: *Deleted

## 2013-05-25 ENCOUNTER — Other Ambulatory Visit: Payer: Self-pay | Admitting: Internal Medicine

## 2013-05-25 MED ORDER — SILODOSIN 8 MG PO CAPS: 8.0000 mg | ORAL_CAPSULE | Freq: Every day | ORAL | Status: DC

## 2013-05-25 MED ORDER — TAMSULOSIN HCL 0.4 MG PO CAPS: 0.4000 mg | ORAL_CAPSULE | Freq: Every day | ORAL | Status: DC

## 2013-05-30 DIAGNOSIS — N400 Enlarged prostate without lower urinary tract symptoms: Secondary | ICD-10-CM | POA: Diagnosis not present

## 2013-05-30 DIAGNOSIS — R39198 Other difficulties with micturition: Secondary | ICD-10-CM | POA: Diagnosis not present

## 2013-06-01 ENCOUNTER — Encounter: Payer: Self-pay | Admitting: Podiatrist

## 2013-06-01 ENCOUNTER — Ambulatory Visit: Payer: Self-pay | Admitting: Podiatrist

## 2013-06-01 VITALS — BP 141/74 | HR 54 | Resp 12

## 2013-06-01 DIAGNOSIS — B351 Tinea unguium: Secondary | ICD-10-CM

## 2013-06-01 NOTE — Progress Notes (Signed)
Nails 1,2,5 right foot and left 1st  Subjective: Patient presents today for re\re lasering of toenails x4 on his right foot. He states he's very happy that the majority of the toenails on the left foot have resolved. He would like to have the laser treatment performed again and we are starting over today. He denies any new changes or concerns. He is excited to have the laser treatment performed.  Objective: Mycotic appearance of nails 1, 2, 5 right foot and first toenail left foot are noted. The remainder of the nails have gone on to heal completely and appear normal.  Assessment: Mycotic toenails x4  Plan: Lasering carried out today without complication and all protocols were followed. I will see him back in 1-2 months for followup. This will be lasering #2. At this time a thorough debridement will be performed as well.

## 2013-06-06 DIAGNOSIS — N529 Male erectile dysfunction, unspecified: Secondary | ICD-10-CM | POA: Diagnosis not present

## 2013-06-06 DIAGNOSIS — N400 Enlarged prostate without lower urinary tract symptoms: Secondary | ICD-10-CM | POA: Diagnosis not present

## 2013-06-06 DIAGNOSIS — R972 Elevated prostate specific antigen [PSA]: Secondary | ICD-10-CM | POA: Diagnosis not present

## 2013-06-07 ENCOUNTER — Ambulatory Visit (INDEPENDENT_AMBULATORY_CARE_PROVIDER_SITE_OTHER): Payer: Medicare Other | Admitting: *Deleted

## 2013-06-07 DIAGNOSIS — I712 Thoracic aortic aneurysm, without rupture, unspecified: Secondary | ICD-10-CM

## 2013-06-07 DIAGNOSIS — I359 Nonrheumatic aortic valve disorder, unspecified: Secondary | ICD-10-CM | POA: Diagnosis not present

## 2013-06-07 DIAGNOSIS — Z7901 Long term (current) use of anticoagulants: Secondary | ICD-10-CM

## 2013-06-07 DIAGNOSIS — Z5181 Encounter for therapeutic drug level monitoring: Secondary | ICD-10-CM | POA: Diagnosis not present

## 2013-06-07 LAB — POCT INR: INR: 2.4

## 2013-06-10 ENCOUNTER — Other Ambulatory Visit: Payer: Self-pay

## 2013-06-10 MED ORDER — AMLODIPINE BESYLATE 5 MG PO TABS: ORAL_TABLET | ORAL | Status: DC

## 2013-06-21 ENCOUNTER — Ambulatory Visit (INDEPENDENT_AMBULATORY_CARE_PROVIDER_SITE_OTHER): Payer: Medicare Other | Admitting: Pharmacist Clinician (PhC)/ Clinical Pharmacy Specialist

## 2013-06-21 DIAGNOSIS — Z7901 Long term (current) use of anticoagulants: Secondary | ICD-10-CM | POA: Diagnosis not present

## 2013-06-21 DIAGNOSIS — Z5181 Encounter for therapeutic drug level monitoring: Secondary | ICD-10-CM | POA: Diagnosis not present

## 2013-06-21 DIAGNOSIS — I712 Thoracic aortic aneurysm, without rupture, unspecified: Secondary | ICD-10-CM

## 2013-06-21 DIAGNOSIS — I359 Nonrheumatic aortic valve disorder, unspecified: Secondary | ICD-10-CM

## 2013-06-21 LAB — POCT INR: INR: 1.7

## 2013-06-21 MED ORDER — WARFARIN SODIUM 7.5 MG PO TABS: ORAL_TABLET | ORAL | Status: DC

## 2013-07-04 ENCOUNTER — Other Ambulatory Visit: Payer: Self-pay | Admitting: Internal Medicine

## 2013-07-04 ENCOUNTER — Ambulatory Visit (INDEPENDENT_AMBULATORY_CARE_PROVIDER_SITE_OTHER): Payer: Medicare Other

## 2013-07-04 DIAGNOSIS — Z7901 Long term (current) use of anticoagulants: Secondary | ICD-10-CM

## 2013-07-04 DIAGNOSIS — I712 Thoracic aortic aneurysm, without rupture, unspecified: Secondary | ICD-10-CM

## 2013-07-04 DIAGNOSIS — I359 Nonrheumatic aortic valve disorder, unspecified: Secondary | ICD-10-CM

## 2013-07-04 DIAGNOSIS — Z5181 Encounter for therapeutic drug level monitoring: Secondary | ICD-10-CM | POA: Diagnosis not present

## 2013-07-04 DIAGNOSIS — L820 Inflamed seborrheic keratosis: Secondary | ICD-10-CM | POA: Diagnosis not present

## 2013-07-04 DIAGNOSIS — D2360 Other benign neoplasm of skin of unspecified upper limb, including shoulder: Secondary | ICD-10-CM | POA: Diagnosis not present

## 2013-07-04 DIAGNOSIS — L821 Other seborrheic keratosis: Secondary | ICD-10-CM | POA: Diagnosis not present

## 2013-07-04 DIAGNOSIS — L570 Actinic keratosis: Secondary | ICD-10-CM | POA: Diagnosis not present

## 2013-07-04 DIAGNOSIS — L219 Seborrheic dermatitis, unspecified: Secondary | ICD-10-CM | POA: Diagnosis not present

## 2013-07-04 DIAGNOSIS — L819 Disorder of pigmentation, unspecified: Secondary | ICD-10-CM | POA: Diagnosis not present

## 2013-07-04 DIAGNOSIS — Z85828 Personal history of other malignant neoplasm of skin: Secondary | ICD-10-CM | POA: Diagnosis not present

## 2013-07-04 DIAGNOSIS — D239 Other benign neoplasm of skin, unspecified: Secondary | ICD-10-CM | POA: Diagnosis not present

## 2013-07-04 LAB — POCT INR: INR: 2.5

## 2013-07-10 ENCOUNTER — Other Ambulatory Visit: Payer: Self-pay | Admitting: Internal Medicine

## 2013-07-25 ENCOUNTER — Ambulatory Visit (INDEPENDENT_AMBULATORY_CARE_PROVIDER_SITE_OTHER): Payer: Medicare Other | Admitting: *Deleted

## 2013-07-25 DIAGNOSIS — I712 Thoracic aortic aneurysm, without rupture, unspecified: Secondary | ICD-10-CM | POA: Diagnosis not present

## 2013-07-25 DIAGNOSIS — Z7901 Long term (current) use of anticoagulants: Secondary | ICD-10-CM | POA: Diagnosis not present

## 2013-07-25 DIAGNOSIS — Z5181 Encounter for therapeutic drug level monitoring: Secondary | ICD-10-CM

## 2013-07-25 DIAGNOSIS — I359 Nonrheumatic aortic valve disorder, unspecified: Secondary | ICD-10-CM

## 2013-07-25 LAB — POCT INR: INR: 2.6

## 2013-08-08 ENCOUNTER — Encounter: Payer: Self-pay | Admitting: Internal Medicine

## 2013-08-12 ENCOUNTER — Encounter: Payer: Self-pay | Admitting: Podiatrist

## 2013-08-12 ENCOUNTER — Ambulatory Visit (INDEPENDENT_AMBULATORY_CARE_PROVIDER_SITE_OTHER): Payer: Medicare Other | Admitting: Podiatrist

## 2013-08-12 VITALS — BP 147/84 | HR 61 | Resp 18

## 2013-08-12 DIAGNOSIS — B351 Tinea unguium: Secondary | ICD-10-CM

## 2013-08-12 NOTE — Progress Notes (Signed)
Nails 1,2,5 right foot and left 1st  Subjective: Patient presents today for re\re lasering of toenails x4 on his right foot. He states he's very happy that the majority of the toenails on the left foot have resolved. He would like to have the laser treatment performed again and we are starting over today. He denies any new changes or concerns. He is excited to have the laser treatment performed.  Objective: Mycotic appearance of nails 1, 2, 5 right foot and first toenail left foot are noted. The remainder of the nails have gone on to heal completely and appear normal.  Assessment: Mycotic toenails x4  Plan: Lasering carried out today without complication and all protocols were followed. I will see him back in 1-2 months for followup. This will be lasering #3. At this time a thorough debridement will be performed as well.

## 2013-08-22 ENCOUNTER — Ambulatory Visit (INDEPENDENT_AMBULATORY_CARE_PROVIDER_SITE_OTHER): Payer: Medicare Other | Admitting: Pharmacist

## 2013-08-22 DIAGNOSIS — I359 Nonrheumatic aortic valve disorder, unspecified: Secondary | ICD-10-CM

## 2013-08-22 DIAGNOSIS — I712 Thoracic aortic aneurysm, without rupture, unspecified: Secondary | ICD-10-CM | POA: Diagnosis not present

## 2013-08-22 DIAGNOSIS — Z7901 Long term (current) use of anticoagulants: Secondary | ICD-10-CM

## 2013-08-22 DIAGNOSIS — Z5181 Encounter for therapeutic drug level monitoring: Secondary | ICD-10-CM | POA: Diagnosis not present

## 2013-08-22 LAB — POCT INR: INR: 2.9

## 2013-08-25 ENCOUNTER — Ambulatory Visit (INDEPENDENT_AMBULATORY_CARE_PROVIDER_SITE_OTHER): Payer: Medicare Other | Admitting: Internal Medicine

## 2013-08-25 ENCOUNTER — Encounter: Payer: Self-pay | Admitting: Internal Medicine

## 2013-08-25 VITALS — BP 138/78 | HR 64 | Temp 98.4°F | Resp 16 | Wt 174.0 lb

## 2013-08-25 DIAGNOSIS — M65849 Other synovitis and tenosynovitis, unspecified hand: Secondary | ICD-10-CM | POA: Diagnosis not present

## 2013-08-25 DIAGNOSIS — M65839 Other synovitis and tenosynovitis, unspecified forearm: Secondary | ICD-10-CM | POA: Diagnosis not present

## 2013-08-25 DIAGNOSIS — M778 Other enthesopathies, not elsewhere classified: Secondary | ICD-10-CM | POA: Insufficient documentation

## 2013-08-25 MED ORDER — TADALAFIL 5 MG PO TABS: 5.0000 mg | ORAL_TABLET | Freq: Every day | ORAL | Status: DC

## 2013-08-25 NOTE — Progress Notes (Signed)
Pre visit review using our clinic review tool, if applicable. No additional management support is needed unless otherwise documented below in the visit note. 

## 2013-08-25 NOTE — Progress Notes (Signed)
Subjective:     Hip Pain  The incident occurred 2 days ago. The incident occurred in the yard. There was no injury mechanism. The pain is present in the right hip (R sacral area). The quality of the pain is described as burning. The pain is severe. The pain has been improving (resolved after 2 Aleves) since onset. He has tried NSAIDs for the symptoms. The treatment provided significant relief.  Back Pain This is a recurrent problem. The problem occurs 2 to 4 times per day. Associated symptoms include headaches. Pertinent negatives include no chest pain, dysuria or weakness. He has tried NSAIDs for the symptoms. The treatment provided significant relief.  Hand Pain  The incident occurred more than 1 week ago. [place]. Pertinent negatives include no chest pain.      Review of Systems  Constitutional: Negative for appetite change, fatigue and unexpected weight change.  HENT: Negative for congestion, nosebleeds, sneezing, sore throat and trouble swallowing.   Eyes: Negative for itching and visual disturbance.  Respiratory: Negative for cough.   Cardiovascular: Negative for chest pain, palpitations and leg swelling.  Gastrointestinal: Negative for nausea, diarrhea, blood in stool and abdominal distention.  Genitourinary: Negative for dysuria, urgency, frequency, hematuria and enuresis.  Musculoskeletal: Positive for back pain. Negative for gait problem, joint swelling and neck pain.  Skin: Negative for rash.  Neurological: Positive for headaches. Negative for dizziness, tremors, speech difficulty, weakness and light-headedness.  Psychiatric/Behavioral: Negative for suicidal ideas, sleep disturbance, dysphoric mood and agitation. The patient is not nervous/anxious.   AK on legs    Wt Readings from Last 3 Encounters:  08/25/13 174 lb (78.926 kg)  05/03/13 176 lb (79.833 kg)  12/17/12 170 lb (77.111 kg)   BP Readings from Last 3 Encounters:  08/25/13 138/78   08/12/13 147/84  06/01/13 141/74        Objective:   Physical Exam  Constitutional: He is oriented to person, place, and time. He appears well-developed and well-nourished.  HENT:  Mouth/Throat: Oropharynx is clear and moist.  Eyes: Conjunctivae are normal. Pupils are equal, round, and reactive to light.  Neck: Normal range of motion. No JVD present. No thyromegaly present.  Cardiovascular: Normal rate, regular rhythm, normal heart sounds and intact distal pulses.  Exam reveals no gallop and no friction rub.   No murmur heard. Pulmonary/Chest: Effort normal and breath sounds normal. No respiratory distress. He has no wheezes. He has no rales. He exhibits no tenderness.  Abdominal: Soft. Bowel sounds are normal. He exhibits no distension and no mass. There is no tenderness. There is no rebound and no guarding.  Musculoskeletal: Normal range of motion. He exhibits tenderness. He exhibits no edema.  L dist deltoid is tender  Lymphadenopathy:    He has no cervical adenopathy.  Neurological: He is alert and oriented to person, place, and time. He has normal reflexes. No cranial nerve deficit. He exhibits normal muscle tone. Coordination normal.  Skin: Skin is warm and dry. No rash noted.  Psychiatric: He has a normal mood and affect. His behavior is normal. Judgment and thought content normal.    Lab Results  Component Value Date   WBC 6.9 02/26/2012   HGB 16.3 02/26/2012   HCT 45.6 02/26/2012   PLT 183 02/26/2012   GLUCOSE 108* 06/24/2012   CHOL 144 03/26/2011   TRIG 87.0 03/26/2011   HDL 48.90 03/26/2011   LDLCALC 78 03/26/2011   ALT 33 02/26/2012   AST 35  02/26/2012   NA 134* 06/24/2012   K 4.6 06/24/2012   CL 102 06/24/2012   CREATININE 1.0 06/24/2012   BUN 20 06/24/2012   CO2 29 06/24/2012   TSH 0.54 03/26/2011   PSA 2.27 09/18/2009   INR 2.9 08/22/2013   HGBA1C 5.4 03/05/2009          Assessment & Plan:

## 2013-08-25 NOTE — Assessment & Plan Note (Addendum)
7/15 wrist/hand extensors are inflamed ACE wrap Splint Pt declined Xrays

## 2013-08-25 NOTE — Patient Instructions (Signed)
wrist/hand extensors are inflamed - rest

## 2013-08-28 ENCOUNTER — Encounter: Payer: Self-pay | Admitting: Internal Medicine

## 2013-09-01 NOTE — Telephone Encounter (Signed)
Encounter complete. 

## 2013-09-05 DIAGNOSIS — A059 Bacterial foodborne intoxication, unspecified: Secondary | ICD-10-CM | POA: Diagnosis not present

## 2013-09-05 DIAGNOSIS — Z7729 Contact with and (suspected ) exposure to other hazardous substances: Secondary | ICD-10-CM | POA: Diagnosis not present

## 2013-09-08 ENCOUNTER — Encounter: Payer: Self-pay | Admitting: Gastroenterology

## 2013-09-09 ENCOUNTER — Telehealth: Payer: Self-pay | Admitting: *Deleted

## 2013-09-09 NOTE — Telephone Encounter (Signed)
Pt called and inform us that he took some Flagyl last weekend just few tablets, it was an old Rx he states. He took it because he had gone out of the country and started feeling bad. He went to a doctor on Monday and was Dx with Giardia infection. He was prescribed on Monday Flagyl 500mg s TID. He states stool is watery, he feels very weak. He forgot to call us to inform us of med. He denies any HX of stroke or clot, thus instructed pt to skip today and tomorrow dosage, take normal dose Sunday and come in on Monday for appt.

## 2013-09-12 ENCOUNTER — Ambulatory Visit (INDEPENDENT_AMBULATORY_CARE_PROVIDER_SITE_OTHER): Payer: Medicare Other | Admitting: Pharmacist

## 2013-09-12 DIAGNOSIS — Z5181 Encounter for therapeutic drug level monitoring: Secondary | ICD-10-CM | POA: Diagnosis not present

## 2013-09-12 DIAGNOSIS — I712 Thoracic aortic aneurysm, without rupture, unspecified: Secondary | ICD-10-CM | POA: Diagnosis not present

## 2013-09-12 DIAGNOSIS — I359 Nonrheumatic aortic valve disorder, unspecified: Secondary | ICD-10-CM | POA: Diagnosis not present

## 2013-09-12 DIAGNOSIS — Z7901 Long term (current) use of anticoagulants: Secondary | ICD-10-CM | POA: Diagnosis not present

## 2013-09-12 LAB — POCT INR: INR: 4

## 2013-09-16 ENCOUNTER — Telehealth: Payer: Self-pay | Admitting: Internal Medicine

## 2013-09-16 MED ORDER — HYDROCOD POLST-CPM POLST ER 10-8 MG PO CP12: 1.0000 | ORAL_CAPSULE | Freq: Two times a day (BID) | ORAL | Status: DC | PRN

## 2013-09-16 NOTE — Telephone Encounter (Signed)
OK - done Thx 

## 2013-09-16 NOTE — Telephone Encounter (Signed)
Rx faxed to [facility]. Pt informed

## 2013-09-16 NOTE — Telephone Encounter (Signed)
Patient has a cough.  He stated last time he was prescribed tusin tablets.  He is requesting the same again.  He uses [facility] on [address].

## 2013-09-19 ENCOUNTER — Ambulatory Visit (INDEPENDENT_AMBULATORY_CARE_PROVIDER_SITE_OTHER): Payer: Medicare Other | Admitting: Pharmacist

## 2013-09-19 DIAGNOSIS — I712 Thoracic aortic aneurysm, without rupture, unspecified: Secondary | ICD-10-CM | POA: Diagnosis not present

## 2013-09-19 DIAGNOSIS — I359 Nonrheumatic aortic valve disorder, unspecified: Secondary | ICD-10-CM | POA: Diagnosis not present

## 2013-09-19 DIAGNOSIS — Z5181 Encounter for therapeutic drug level monitoring: Secondary | ICD-10-CM

## 2013-09-19 DIAGNOSIS — Z7901 Long term (current) use of anticoagulants: Secondary | ICD-10-CM

## 2013-09-19 LAB — PROTIME-INR
INR: 7.9 ratio (ref 0.8–1.0)
PROTHROMBIN TIME: 82.7 s — AB (ref 9.6–13.1)

## 2013-09-19 LAB — POCT INR: INR: 8

## 2013-09-26 ENCOUNTER — Ambulatory Visit (INDEPENDENT_AMBULATORY_CARE_PROVIDER_SITE_OTHER): Payer: Medicare Other | Admitting: Pharmacist

## 2013-09-26 DIAGNOSIS — Z7901 Long term (current) use of anticoagulants: Secondary | ICD-10-CM

## 2013-09-26 DIAGNOSIS — Z5181 Encounter for therapeutic drug level monitoring: Secondary | ICD-10-CM

## 2013-09-26 DIAGNOSIS — I712 Thoracic aortic aneurysm, without rupture, unspecified: Secondary | ICD-10-CM | POA: Diagnosis not present

## 2013-09-26 DIAGNOSIS — I359 Nonrheumatic aortic valve disorder, unspecified: Secondary | ICD-10-CM | POA: Diagnosis not present

## 2013-09-26 LAB — POCT INR: INR: 3.1

## 2013-10-07 ENCOUNTER — Telehealth: Payer: Self-pay | Admitting: Internal Medicine

## 2013-10-07 ENCOUNTER — Ambulatory Visit (INDEPENDENT_AMBULATORY_CARE_PROVIDER_SITE_OTHER): Payer: Medicare Other | Admitting: Pharmacist

## 2013-10-07 DIAGNOSIS — Z952 Presence of prosthetic heart valve: Secondary | ICD-10-CM

## 2013-10-07 DIAGNOSIS — Z7901 Long term (current) use of anticoagulants: Secondary | ICD-10-CM | POA: Diagnosis not present

## 2013-10-07 DIAGNOSIS — Z5181 Encounter for therapeutic drug level monitoring: Secondary | ICD-10-CM | POA: Diagnosis not present

## 2013-10-07 DIAGNOSIS — I712 Thoracic aortic aneurysm, without rupture, unspecified: Secondary | ICD-10-CM

## 2013-10-07 DIAGNOSIS — R972 Elevated prostate specific antigen [PSA]: Secondary | ICD-10-CM

## 2013-10-07 DIAGNOSIS — I359 Nonrheumatic aortic valve disorder, unspecified: Secondary | ICD-10-CM | POA: Diagnosis not present

## 2013-10-07 DIAGNOSIS — E785 Hyperlipidemia, unspecified: Secondary | ICD-10-CM

## 2013-10-07 LAB — POCT INR: INR: 2.8

## 2013-10-07 NOTE — Telephone Encounter (Signed)
Patient has an appointment on Wed, 8/19  For a follow-up and is calling to ask if [name] can order labs for him to have before his OV. He wants to have his cholesterol checked to see if he may be able to come off of his low dose cholesterol medication and he also wants his PSA level checked. Please advise. Patient would like a call back once lab orders are put in.

## 2013-10-10 NOTE — Telephone Encounter (Signed)
Ok Done Thx 

## 2013-10-11 ENCOUNTER — Other Ambulatory Visit (INDEPENDENT_AMBULATORY_CARE_PROVIDER_SITE_OTHER): Payer: Medicare Other

## 2013-10-11 DIAGNOSIS — Z954 Presence of other heart-valve replacement: Secondary | ICD-10-CM | POA: Diagnosis not present

## 2013-10-11 DIAGNOSIS — E785 Hyperlipidemia, unspecified: Secondary | ICD-10-CM | POA: Diagnosis not present

## 2013-10-11 DIAGNOSIS — Z952 Presence of prosthetic heart valve: Secondary | ICD-10-CM

## 2013-10-11 DIAGNOSIS — R972 Elevated prostate specific antigen [PSA]: Secondary | ICD-10-CM

## 2013-10-11 LAB — CBC WITH DIFFERENTIAL/PLATELET
Basophils Absolute: 0 10*3/uL (ref 0.0–0.1)
Basophils Relative: 0.4 % (ref 0.0–3.0)
EOS ABS: 0.1 10*3/uL (ref 0.0–0.7)
Eosinophils Relative: 1.9 % (ref 0.0–5.0)
HCT: 46.2 % (ref 39.0–52.0)
HEMOGLOBIN: 15.5 g/dL (ref 13.0–17.0)
LYMPHS PCT: 18.4 % (ref 12.0–46.0)
Lymphs Abs: 1 10*3/uL (ref 0.7–4.0)
MCHC: 33.5 g/dL (ref 30.0–36.0)
MCV: 94.6 fl (ref 78.0–100.0)
MONOS PCT: 9.8 % (ref 3.0–12.0)
Monocytes Absolute: 0.5 10*3/uL (ref 0.1–1.0)
NEUTROS ABS: 3.8 10*3/uL (ref 1.4–7.7)
NEUTROS PCT: 69.5 % (ref 43.0–77.0)
Platelets: 163 10*3/uL (ref 150.0–400.0)
RBC: 4.89 Mil/uL (ref 4.22–5.81)
RDW: 13.2 % (ref 11.5–15.5)
WBC: 5.5 10*3/uL (ref 4.0–10.5)

## 2013-10-11 LAB — URINALYSIS
BILIRUBIN URINE: NEGATIVE
Ketones, ur: NEGATIVE
Leukocytes, UA: NEGATIVE
Nitrite: NEGATIVE
Specific Gravity, Urine: 1.025 (ref 1.000–1.030)
TOTAL PROTEIN, URINE-UPE24: NEGATIVE
Urine Glucose: NEGATIVE
Urobilinogen, UA: 0.2 (ref 0.0–1.0)
pH: 5.5 (ref 5.0–8.0)

## 2013-10-11 LAB — TSH: TSH: 0.88 u[IU]/mL (ref 0.35–4.50)

## 2013-10-11 LAB — BASIC METABOLIC PANEL
BUN: 17 mg/dL (ref 6–23)
CALCIUM: 9.2 mg/dL (ref 8.4–10.5)
CO2: 28 meq/L (ref 19–32)
CREATININE: 1.1 mg/dL (ref 0.4–1.5)
Chloride: 106 mEq/L (ref 96–112)
GFR: 72.36 mL/min (ref >60.00)
Glucose, Bld: 83 mg/dL (ref 70–99)
Potassium: 4.4 mEq/L (ref 3.5–5.1)
SODIUM: 140 meq/L (ref 135–145)

## 2013-10-11 LAB — LIPID PANEL
CHOLESTEROL: 114 mg/dL (ref 0–200)
HDL: 39.1 mg/dL (ref >39.00)
LDL Cholesterol: 54 mg/dL (ref 0–99)
NonHDL: 74.9
Total CHOL/HDL Ratio: 3
Triglycerides: 103 mg/dL (ref 0.0–149.0)
VLDL: 20.6 mg/dL (ref 0.0–40.0)

## 2013-10-11 LAB — PSA: PSA: 3.68 ng/mL (ref 0.10–4.00)

## 2013-10-11 NOTE — Telephone Encounter (Signed)
Notified pt with md response.../lmb 

## 2013-10-12 ENCOUNTER — Encounter: Payer: Self-pay | Admitting: Internal Medicine

## 2013-10-12 ENCOUNTER — Ambulatory Visit (INDEPENDENT_AMBULATORY_CARE_PROVIDER_SITE_OTHER): Payer: Medicare Other | Admitting: Internal Medicine

## 2013-10-12 VITALS — BP 122/80 | HR 80 | Temp 98.3°F | Resp 16 | Wt 166.0 lb

## 2013-10-12 DIAGNOSIS — M778 Other enthesopathies, not elsewhere classified: Secondary | ICD-10-CM

## 2013-10-12 DIAGNOSIS — M65839 Other synovitis and tenosynovitis, unspecified forearm: Secondary | ICD-10-CM | POA: Diagnosis not present

## 2013-10-12 DIAGNOSIS — M25569 Pain in unspecified knee: Secondary | ICD-10-CM | POA: Diagnosis not present

## 2013-10-12 DIAGNOSIS — E785 Hyperlipidemia, unspecified: Secondary | ICD-10-CM

## 2013-10-12 DIAGNOSIS — L570 Actinic keratosis: Secondary | ICD-10-CM | POA: Diagnosis not present

## 2013-10-12 DIAGNOSIS — M25562 Pain in left knee: Secondary | ICD-10-CM

## 2013-10-12 DIAGNOSIS — M65849 Other synovitis and tenosynovitis, unspecified hand: Secondary | ICD-10-CM | POA: Diagnosis not present

## 2013-10-12 MED ORDER — ATORVASTATIN CALCIUM 10 MG PO TABS: 5.0000 mg | ORAL_TABLET | Freq: Every day | ORAL | Status: DC

## 2013-10-12 MED ORDER — AMLODIPINE BESYLATE 5 MG PO TABS: 2.5000 mg | ORAL_TABLET | Freq: Every day | ORAL | Status: DC

## 2013-10-12 NOTE — Progress Notes (Signed)
Pre visit review using our clinic review tool, if applicable. No additional management support is needed unless otherwise documented below in the visit note. 

## 2013-10-12 NOTE — Assessment & Plan Note (Addendum)
Medial aspect 8/15 Ice  Voltaren gel Hold/reduce Lipitor

## 2013-10-12 NOTE — Assessment & Plan Note (Signed)
Reduce Lipitor to 5 mg/d or qod

## 2013-10-12 NOTE — Progress Notes (Signed)
   Subjective:    Patient ID: Patrick [name], male    DOB: Mar 17, 1940, 73 y.o.   MRN: [id number]  HPI  The patient presents for a follow-up of  chronic hypertension, chronic dyslipidemia, anticoagulation Patrick [name] would like to reduce or to stop his Norvasc, Lipitor C/o skin lesion on R shin   Review of Systems  Constitutional: Negative for appetite change, fatigue and unexpected weight change.  HENT: Negative for congestion, nosebleeds, sneezing, sore throat and trouble swallowing.   Eyes: Negative for itching and visual disturbance.  Respiratory: Negative for cough.   Cardiovascular: Negative for chest pain, palpitations and leg swelling.  Gastrointestinal: Negative for nausea, diarrhea, blood in stool and abdominal distention.  Genitourinary: Negative for frequency and hematuria.  Musculoskeletal: Positive for arthralgias. Negative for back pain, gait problem, joint swelling and neck pain.  Skin: Negative for rash.  Neurological: Negative for dizziness, tremors, speech difficulty and weakness.  Psychiatric/Behavioral: Negative for sleep disturbance, dysphoric mood and agitation. The patient is not nervous/anxious.        Objective:   Physical Exam  Constitutional: He is oriented to person, place, and time. He appears well-developed. No distress.  NAD  HENT:  Mouth/Throat: Oropharynx is clear and moist.  Eyes: Conjunctivae are normal. Pupils are equal, round, and reactive to light.  Neck: Normal range of motion. No JVD present. No thyromegaly present.  Cardiovascular: Normal rate, regular rhythm, normal heart sounds and intact distal pulses.  Exam reveals no gallop and no friction rub.   No murmur heard. Pulmonary/Chest: Effort normal and breath sounds normal. No respiratory distress. He has no wheezes. He has no rales. He exhibits no tenderness.  Abdominal: Soft. Bowel sounds are normal. He exhibits no distension and no mass. There is no tenderness. There is no rebound and no  guarding.  Musculoskeletal: Normal range of motion. He exhibits no edema and no tenderness.  Lymphadenopathy:    He has no cervical adenopathy.  Neurological: He is alert and oriented to person, place, and time. He has normal reflexes. No cranial nerve deficit. He exhibits normal muscle tone. He displays a negative Romberg sign. Coordination and gait normal.  Skin: Skin is warm and dry. No rash noted.  Psychiatric: He has a normal mood and affect. His behavior is normal. Judgment and thought content normal.  R shin lesion     Procedure Note :     Procedure : Cryosurgery   Indication: Actinic keratosis(es) R lower shin   Risks including unsuccessful procedure , bleeding, infection, bruising, scar, a need for a repeat  procedure and others were explained to the patient in detail as well as the benefits. Informed consent was obtained verbally.    1 lesion(s)  on RLE   was/were treated with liquid nitrogen on a Q-tip in a usual fasion . Band-Aid was applied and antibiotic ointment was given for a later use.   Tolerated well. Complications none.         Assessment & Plan:

## 2013-10-12 NOTE — Assessment & Plan Note (Signed)
Better  Reduce Lipitor

## 2013-10-14 ENCOUNTER — Encounter: Payer: Self-pay | Admitting: Podiatrist

## 2013-10-14 ENCOUNTER — Ambulatory Visit (INDEPENDENT_AMBULATORY_CARE_PROVIDER_SITE_OTHER): Payer: Medicare Other | Admitting: Podiatrist

## 2013-10-14 VITALS — BP 135/77 | HR 64 | Resp 18

## 2013-10-14 DIAGNOSIS — B351 Tinea unguium: Secondary | ICD-10-CM

## 2013-10-14 NOTE — Progress Notes (Signed)
   Subjective:    Patient ID: Patrick [name], male    DOB: Jun 19, 1940, 73 y.o.   MRN: [id number]  HPI i am here to get another laser treatment done and i can tell a little bit of a difference but not much.  Nails 1,2,5 right foot and left 1st     Review of Systems     Objective:   Physical Exam Objective: Mycotic appearance of nails 1, 2, 5 right foot and first toenail left foot are noted. The remainder of the nails have gone on to heal completely and appear normal.      Assessment & Plan:  Assessment: Mycotic toenails x4  Plan: Lasering carried out today without complication and all protocols were followed. I will see him back in 1-2 months for followup. This will be lasering #4.

## 2013-10-27 ENCOUNTER — Ambulatory Visit (INDEPENDENT_AMBULATORY_CARE_PROVIDER_SITE_OTHER): Payer: Medicare Other | Admitting: *Deleted

## 2013-10-27 DIAGNOSIS — Z7901 Long term (current) use of anticoagulants: Secondary | ICD-10-CM

## 2013-10-27 DIAGNOSIS — I712 Thoracic aortic aneurysm, without rupture, unspecified: Secondary | ICD-10-CM

## 2013-10-27 DIAGNOSIS — Z5181 Encounter for therapeutic drug level monitoring: Secondary | ICD-10-CM | POA: Diagnosis not present

## 2013-10-27 DIAGNOSIS — I359 Nonrheumatic aortic valve disorder, unspecified: Secondary | ICD-10-CM

## 2013-10-27 LAB — POCT INR: INR: 1.9

## 2013-11-08 DIAGNOSIS — Z85828 Personal history of other malignant neoplasm of skin: Secondary | ICD-10-CM | POA: Diagnosis not present

## 2013-11-08 DIAGNOSIS — L570 Actinic keratosis: Secondary | ICD-10-CM | POA: Diagnosis not present

## 2013-11-08 DIAGNOSIS — L820 Inflamed seborrheic keratosis: Secondary | ICD-10-CM | POA: Diagnosis not present

## 2013-11-11 ENCOUNTER — Ambulatory Visit (INDEPENDENT_AMBULATORY_CARE_PROVIDER_SITE_OTHER): Payer: Medicare Other

## 2013-11-11 DIAGNOSIS — Z5181 Encounter for therapeutic drug level monitoring: Secondary | ICD-10-CM | POA: Diagnosis not present

## 2013-11-11 DIAGNOSIS — I712 Thoracic aortic aneurysm, without rupture, unspecified: Secondary | ICD-10-CM

## 2013-11-11 DIAGNOSIS — I359 Nonrheumatic aortic valve disorder, unspecified: Secondary | ICD-10-CM

## 2013-11-11 DIAGNOSIS — Z7901 Long term (current) use of anticoagulants: Secondary | ICD-10-CM | POA: Diagnosis not present

## 2013-11-11 LAB — POCT INR: INR: 2.5

## 2013-12-05 ENCOUNTER — Ambulatory Visit (INDEPENDENT_AMBULATORY_CARE_PROVIDER_SITE_OTHER): Payer: Medicare Other | Admitting: *Deleted

## 2013-12-05 DIAGNOSIS — Z7901 Long term (current) use of anticoagulants: Secondary | ICD-10-CM | POA: Diagnosis not present

## 2013-12-05 DIAGNOSIS — Z5181 Encounter for therapeutic drug level monitoring: Secondary | ICD-10-CM

## 2013-12-05 DIAGNOSIS — I712 Thoracic aortic aneurysm, without rupture, unspecified: Secondary | ICD-10-CM

## 2013-12-05 DIAGNOSIS — I359 Nonrheumatic aortic valve disorder, unspecified: Secondary | ICD-10-CM | POA: Diagnosis not present

## 2013-12-05 LAB — POCT INR: INR: 2.3

## 2013-12-21 ENCOUNTER — Other Ambulatory Visit: Payer: Self-pay | Admitting: *Deleted

## 2013-12-21 MED ORDER — WARFARIN SODIUM 7.5 MG PO TABS: ORAL_TABLET | ORAL | Status: DC

## 2013-12-30 DIAGNOSIS — N400 Enlarged prostate without lower urinary tract symptoms: Secondary | ICD-10-CM | POA: Diagnosis not present

## 2014-01-02 ENCOUNTER — Ambulatory Visit (INDEPENDENT_AMBULATORY_CARE_PROVIDER_SITE_OTHER): Payer: Medicare Other | Admitting: *Deleted

## 2014-01-02 ENCOUNTER — Other Ambulatory Visit: Payer: Self-pay | Admitting: Dermatology

## 2014-01-02 DIAGNOSIS — L821 Other seborrheic keratosis: Secondary | ICD-10-CM | POA: Diagnosis not present

## 2014-01-02 DIAGNOSIS — D485 Neoplasm of uncertain behavior of skin: Secondary | ICD-10-CM | POA: Diagnosis not present

## 2014-01-02 DIAGNOSIS — D2271 Melanocytic nevi of right lower limb, including hip: Secondary | ICD-10-CM | POA: Diagnosis not present

## 2014-01-02 DIAGNOSIS — I712 Thoracic aortic aneurysm, without rupture, unspecified: Secondary | ICD-10-CM

## 2014-01-02 DIAGNOSIS — Z7901 Long term (current) use of anticoagulants: Secondary | ICD-10-CM

## 2014-01-02 DIAGNOSIS — I359 Nonrheumatic aortic valve disorder, unspecified: Secondary | ICD-10-CM

## 2014-01-02 DIAGNOSIS — Z5181 Encounter for therapeutic drug level monitoring: Secondary | ICD-10-CM

## 2014-01-02 DIAGNOSIS — D225 Melanocytic nevi of trunk: Secondary | ICD-10-CM | POA: Diagnosis not present

## 2014-01-02 DIAGNOSIS — D2272 Melanocytic nevi of left lower limb, including hip: Secondary | ICD-10-CM | POA: Diagnosis not present

## 2014-01-02 DIAGNOSIS — Z85828 Personal history of other malignant neoplasm of skin: Secondary | ICD-10-CM | POA: Diagnosis not present

## 2014-01-02 DIAGNOSIS — L812 Freckles: Secondary | ICD-10-CM | POA: Diagnosis not present

## 2014-01-02 DIAGNOSIS — L820 Inflamed seborrheic keratosis: Secondary | ICD-10-CM | POA: Diagnosis not present

## 2014-01-02 DIAGNOSIS — L218 Other seborrheic dermatitis: Secondary | ICD-10-CM | POA: Diagnosis not present

## 2014-01-02 DIAGNOSIS — D2361 Other benign neoplasm of skin of right upper limb, including shoulder: Secondary | ICD-10-CM | POA: Diagnosis not present

## 2014-01-02 DIAGNOSIS — D3612 Benign neoplasm of peripheral nerves and autonomic nervous system, upper limb, including shoulder: Secondary | ICD-10-CM | POA: Diagnosis not present

## 2014-01-02 LAB — POCT INR: INR: 1.9

## 2014-01-03 DIAGNOSIS — R972 Elevated prostate specific antigen [PSA]: Secondary | ICD-10-CM | POA: Diagnosis not present

## 2014-01-17 ENCOUNTER — Ambulatory Visit (INDEPENDENT_AMBULATORY_CARE_PROVIDER_SITE_OTHER): Payer: Medicare Other | Admitting: *Deleted

## 2014-01-17 DIAGNOSIS — Z5181 Encounter for therapeutic drug level monitoring: Secondary | ICD-10-CM | POA: Diagnosis not present

## 2014-01-17 DIAGNOSIS — I712 Thoracic aortic aneurysm, without rupture, unspecified: Secondary | ICD-10-CM

## 2014-01-17 DIAGNOSIS — Z7901 Long term (current) use of anticoagulants: Secondary | ICD-10-CM

## 2014-01-17 DIAGNOSIS — I359 Nonrheumatic aortic valve disorder, unspecified: Secondary | ICD-10-CM

## 2014-01-17 LAB — POCT INR: INR: 2.2

## 2014-02-03 ENCOUNTER — Ambulatory Visit (INDEPENDENT_AMBULATORY_CARE_PROVIDER_SITE_OTHER): Payer: Medicare Other | Admitting: *Deleted

## 2014-02-03 ENCOUNTER — Telehealth: Payer: Self-pay | Admitting: Internal Medicine

## 2014-02-03 DIAGNOSIS — Z5181 Encounter for therapeutic drug level monitoring: Secondary | ICD-10-CM | POA: Diagnosis not present

## 2014-02-03 DIAGNOSIS — Z7901 Long term (current) use of anticoagulants: Secondary | ICD-10-CM | POA: Diagnosis not present

## 2014-02-03 DIAGNOSIS — I712 Thoracic aortic aneurysm, without rupture, unspecified: Secondary | ICD-10-CM

## 2014-02-03 DIAGNOSIS — I359 Nonrheumatic aortic valve disorder, unspecified: Secondary | ICD-10-CM

## 2014-02-03 LAB — POCT INR: INR: 2.9

## 2014-02-03 MED ORDER — CIPROFLOXACIN HCL 500 MG PO TABS: 500.0000 mg | ORAL_TABLET | Freq: Two times a day (BID) | ORAL | Status: DC

## 2014-02-03 NOTE — Telephone Encounter (Signed)
Needs Cipro for travel

## 2014-03-03 ENCOUNTER — Ambulatory Visit (INDEPENDENT_AMBULATORY_CARE_PROVIDER_SITE_OTHER): Payer: Medicare Other | Admitting: Pharmacist

## 2014-03-03 DIAGNOSIS — I712 Thoracic aortic aneurysm, without rupture, unspecified: Secondary | ICD-10-CM

## 2014-03-03 DIAGNOSIS — Z5181 Encounter for therapeutic drug level monitoring: Secondary | ICD-10-CM

## 2014-03-03 DIAGNOSIS — Z7901 Long term (current) use of anticoagulants: Secondary | ICD-10-CM

## 2014-03-03 DIAGNOSIS — I359 Nonrheumatic aortic valve disorder, unspecified: Secondary | ICD-10-CM | POA: Diagnosis not present

## 2014-03-03 LAB — POCT INR: INR: 1.5

## 2014-03-09 ENCOUNTER — Encounter: Payer: Self-pay | Admitting: Family Medicine

## 2014-03-09 ENCOUNTER — Ambulatory Visit (INDEPENDENT_AMBULATORY_CARE_PROVIDER_SITE_OTHER): Payer: Medicare Other | Admitting: Family Medicine

## 2014-03-09 ENCOUNTER — Other Ambulatory Visit (INDEPENDENT_AMBULATORY_CARE_PROVIDER_SITE_OTHER): Payer: Medicare Other

## 2014-03-09 VITALS — BP 138/70 | HR 69 | Ht 72.0 in | Wt 173.0 lb

## 2014-03-09 DIAGNOSIS — M25511 Pain in right shoulder: Secondary | ICD-10-CM

## 2014-03-09 DIAGNOSIS — M7551 Bursitis of right shoulder: Secondary | ICD-10-CM

## 2014-03-09 MED ORDER — MELOXICAM 15 MG PO TABS: 15.0000 mg | ORAL_TABLET | Freq: Every day | ORAL | Status: DC

## 2014-03-09 NOTE — Assessment & Plan Note (Signed)
Patient tolerated procedure very well. We discussed over-the-counter medicines and patient was given a prescription for a anti-inflammatory. Discussed this though with patient's Coumadin 20 be wary of thinning of his blood. We discussed icing regimen and home exercises. Patient was given handout. Patient will try these different conservative measures and come back in 3 weeks for further evaluation. Patient does have severe osteophytic changes of the before meals joint and continued have pain I would consider an injection there. Also patient having positive impingement sign we can also do a specific injection into the bursa itself is necessary.

## 2014-03-09 NOTE — Patient Instructions (Addendum)
Good to see you.   Ice 20 minutes 2 times daily. Usually after activity and before bed. Exercises 3 times a week.  Vitamin D 2000 IU daily.  Turmeric 500mg  twice daily.  Meloxicam when you need it. Careful with the blood thinner.  See me again in 3 weeks.

## 2014-03-09 NOTE — Progress Notes (Signed)
Pre visit review using our clinic review tool, if applicable. No additional management support is needed unless otherwise documented below in the visit note. 

## 2014-03-09 NOTE — Progress Notes (Signed)
[name] [address] [place], [place] [address] Phone: [phone] Subjective:    I'm seeing this patient by the request  of: Walker Kehr, MD   CC: right shoulder pain.   [id number] Patrick [name] is a 74 y.o. male coming in with complaint of right shoulder pain.  Patient says he has had this pain for greater than 2 months. Patient notices it first when he was throwing a tennis ball repetitively for his dog. Patient states even with golf it gives him some discomfort. Patient states the pain seems to be worse when he is extending his arm. Patient denies any weakness or numbness. States that he feels a band around the upper humerus. Patient states at night to it is starting to wake him up. Denies any association with any neck pain. It abuts the severity of 7 out of 10 and it is changing his daily activities.     Past medical history, social, surgical and family history all reviewed in electronic medical record.   Review of Systems: No headache, visual changes, nausea, vomiting, diarrhea, constipation, dizziness, abdominal pain, skin rash, fevers, chills, night sweats, weight loss, swollen lymph nodes, body aches, joint swelling, muscle aches, chest pain, shortness of breath, mood changes.   Objective Blood pressure 138/70, pulse 69, height 6' (1.829 m), weight 173 lb (78.472 kg), SpO2 97 %.  General: No apparent distress alert and oriented x3 mood and affect normal, dressed appropriately.  HEENT: Pupils equal, extraocular movements intact  Respiratory: Patient's speak in full sentences and does not appear short of breath  Cardiovascular: No lower extremity edema, non tender, no erythema  Skin: Warm dry intact with no signs of infection or rash on extremities or on axial skeleton.  Abdomen: Soft nontender  Neuro: Cranial nerves II through XII are intact, neurovascularly intact in all extremities with 2+ DTRs and 2+ pulses.  Lymph: No lymphadenopathy  of posterior or anterior cervical chain or axillae bilaterally.  Gait normal with good balance and coordination.  MSK:  Non tender with full range of motion and good stability and symmetric strength and tone of  elbows, wrist, hip, knee and ankles bilaterally.  Shoulder: Right Inspection reveals no abnormalities, atrophy or asymmetry. Palpation is normal with no tenderness over AC joint or bicipital groove. ROM is full in all planes passively. Rotator cuff strength normal throughout. signs of impingement with positive Neer and Hawkin's tests, but negative empty can sign. Speeds and Yergason's tests normal. No labral pathology noted with negative Obrien's, negative clunk and good stability. Normal scapular function observed. No painful arc and no drop arm sign. No apprehension sign  MSK US performed of: Right This study was ordered, performed, and interpreted by Charlann Boxer D.O.  Shoulder:   Supraspinatus:  Appears normal on long and transverse views, Bursal bulge seen with shoulder abduction on impingement view.  Infraspinatus:  Appears normal on long and transverse views. Significant increase in Doppler flow Subscapularis:  Appears normal on long and transverse views. Positive bursa  +++ impingement view! Teres Minor:  Appears normal on long and transverse views. AC joint:  Severe arthritis Glenohumeral Joint:  Appears normal without effusion. Glenoid Labrum:  Intact without visualized tears. Biceps Tendon:  Appears normal on long and transverse views, no fraying of tendon, tendon located in intertubercular groove, no subluxation with shoulder internal or external rotation.  Impression: Subacromial bursitis  Procedure: Real-time Ultrasound Guided Injection of right glenohumeral joint Device: GE Logiq E  Ultrasound  guided injection is preferred based studies that show increased duration, increased effect, greater accuracy, decreased procedural pain, increased response rate with  ultrasound guided versus blind injection.  Verbal informed consent obtained.  Time-out conducted.  Noted no overlying erythema, induration, or other signs of local infection.  Skin prepped in a sterile fashion.  Local anesthesia: Topical Ethyl chloride.  With sterile technique and under real time ultrasound guidance:  Joint visualized.  23g 1  inch needle inserted posterior approach. Pictures taken for needle placement. Patient did have injection of 2 cc of 1% lidocaine, 2 cc of 0.5% Marcaine, and 1.0 cc of Kenalog 40 mg/dL. Completed without difficulty  Pain immediately resolved suggesting accurate placement of the medication.  Advised to call if fevers/chills, erythema, induration, drainage, or persistent bleeding.  Images permanently stored and available for review in the ultrasound unit.  Impression: Technically successful ultrasound guided injection.     Impression and Recommendations:     This case required medical decision making of moderate complexity.

## 2014-03-16 ENCOUNTER — Ambulatory Visit (INDEPENDENT_AMBULATORY_CARE_PROVIDER_SITE_OTHER): Payer: Medicare Other | Admitting: Podiatrist

## 2014-03-16 ENCOUNTER — Ambulatory Visit (INDEPENDENT_AMBULATORY_CARE_PROVIDER_SITE_OTHER): Payer: Medicare Other | Admitting: *Deleted

## 2014-03-16 DIAGNOSIS — I359 Nonrheumatic aortic valve disorder, unspecified: Secondary | ICD-10-CM

## 2014-03-16 DIAGNOSIS — I712 Thoracic aortic aneurysm, without rupture, unspecified: Secondary | ICD-10-CM

## 2014-03-16 DIAGNOSIS — B351 Tinea unguium: Secondary | ICD-10-CM

## 2014-03-16 DIAGNOSIS — Z23 Encounter for immunization: Secondary | ICD-10-CM

## 2014-03-16 DIAGNOSIS — Z5181 Encounter for therapeutic drug level monitoring: Secondary | ICD-10-CM | POA: Diagnosis not present

## 2014-03-16 DIAGNOSIS — Z7901 Long term (current) use of anticoagulants: Secondary | ICD-10-CM | POA: Diagnosis not present

## 2014-03-16 LAB — POCT INR: INR: 1.8

## 2014-03-16 NOTE — Progress Notes (Signed)
   Subjective:    Patient ID: Patrick [name], male    DOB: [birth year], 74 y.o.   MRN: [id number]  Patient presents today for lasering of toenails. He relates that minimal improvement has been noted since the last visit. --Nails 1,2,5 right foot and left 1st       Objective:   Physical Exam Objective: Mycotic appearance of nails 1, 2, 5 right foot and first toenail left foot are noted. The remainder of the nails have gone on to heal completely and appear normal.      Assessment & Plan:  Assessment: Mycotic toenails x4  Plan: Lasering carried out today without complication and all protocols were followed. I will see him back in 1-2 months for followup. This will be lasering #5

## 2014-03-17 ENCOUNTER — Ambulatory Visit: Payer: Medicare Other | Admitting: Podiatrist

## 2014-03-28 ENCOUNTER — Other Ambulatory Visit: Payer: Self-pay | Admitting: Dermatology

## 2014-03-28 DIAGNOSIS — L570 Actinic keratosis: Secondary | ICD-10-CM | POA: Diagnosis not present

## 2014-03-28 DIAGNOSIS — R972 Elevated prostate specific antigen [PSA]: Secondary | ICD-10-CM | POA: Diagnosis not present

## 2014-03-28 DIAGNOSIS — L821 Other seborrheic keratosis: Secondary | ICD-10-CM | POA: Diagnosis not present

## 2014-03-28 DIAGNOSIS — Z85828 Personal history of other malignant neoplasm of skin: Secondary | ICD-10-CM | POA: Diagnosis not present

## 2014-03-28 DIAGNOSIS — D485 Neoplasm of uncertain behavior of skin: Secondary | ICD-10-CM | POA: Diagnosis not present

## 2014-03-31 ENCOUNTER — Ambulatory Visit (INDEPENDENT_AMBULATORY_CARE_PROVIDER_SITE_OTHER): Payer: Medicare Other

## 2014-03-31 DIAGNOSIS — I712 Thoracic aortic aneurysm, without rupture, unspecified: Secondary | ICD-10-CM

## 2014-03-31 DIAGNOSIS — I359 Nonrheumatic aortic valve disorder, unspecified: Secondary | ICD-10-CM | POA: Diagnosis not present

## 2014-03-31 DIAGNOSIS — Z7901 Long term (current) use of anticoagulants: Secondary | ICD-10-CM

## 2014-03-31 DIAGNOSIS — Z5181 Encounter for therapeutic drug level monitoring: Secondary | ICD-10-CM | POA: Diagnosis not present

## 2014-03-31 LAB — POCT INR: INR: 2.5

## 2014-04-06 ENCOUNTER — Other Ambulatory Visit: Payer: Self-pay

## 2014-04-06 DIAGNOSIS — N529 Male erectile dysfunction, unspecified: Secondary | ICD-10-CM | POA: Diagnosis not present

## 2014-04-06 DIAGNOSIS — R972 Elevated prostate specific antigen [PSA]: Secondary | ICD-10-CM | POA: Diagnosis not present

## 2014-04-06 DIAGNOSIS — N400 Enlarged prostate without lower urinary tract symptoms: Secondary | ICD-10-CM | POA: Diagnosis not present

## 2014-04-06 MED ORDER — AMLODIPINE BESYLATE 5 MG PO TABS: 2.5000 mg | ORAL_TABLET | Freq: Every day | ORAL | Status: DC

## 2014-04-18 DIAGNOSIS — L720 Epidermal cyst: Secondary | ICD-10-CM | POA: Diagnosis not present

## 2014-04-18 DIAGNOSIS — Z85828 Personal history of other malignant neoplasm of skin: Secondary | ICD-10-CM | POA: Diagnosis not present

## 2014-04-20 ENCOUNTER — Telehealth: Payer: Self-pay | Admitting: Family Medicine

## 2014-04-20 MED ORDER — MELOXICAM 15 MG PO TABS: 15.0000 mg | ORAL_TABLET | Freq: Every day | ORAL | Status: DC

## 2014-04-20 NOTE — Telephone Encounter (Signed)
Patient requesting refill of meloxicam (MOBIC) 15 MG tablet [[id number]]

## 2014-04-20 NOTE — Telephone Encounter (Signed)
Refill done.  

## 2014-04-24 ENCOUNTER — Ambulatory Visit (INDEPENDENT_AMBULATORY_CARE_PROVIDER_SITE_OTHER): Payer: Medicare Other

## 2014-04-24 DIAGNOSIS — Z7901 Long term (current) use of anticoagulants: Secondary | ICD-10-CM | POA: Diagnosis not present

## 2014-04-24 DIAGNOSIS — I712 Thoracic aortic aneurysm, without rupture, unspecified: Secondary | ICD-10-CM

## 2014-04-24 DIAGNOSIS — I359 Nonrheumatic aortic valve disorder, unspecified: Secondary | ICD-10-CM | POA: Diagnosis not present

## 2014-04-24 DIAGNOSIS — Z5181 Encounter for therapeutic drug level monitoring: Secondary | ICD-10-CM

## 2014-04-24 LAB — POCT INR: INR: 2.4

## 2014-05-04 ENCOUNTER — Other Ambulatory Visit: Payer: Self-pay | Admitting: Internal Medicine

## 2014-05-16 ENCOUNTER — Ambulatory Visit (INDEPENDENT_AMBULATORY_CARE_PROVIDER_SITE_OTHER): Payer: Medicare Other | Admitting: *Deleted

## 2014-05-16 ENCOUNTER — Encounter: Payer: Self-pay | Admitting: Cardiovascular Disease

## 2014-05-16 ENCOUNTER — Ambulatory Visit (INDEPENDENT_AMBULATORY_CARE_PROVIDER_SITE_OTHER): Payer: Medicare Other | Admitting: Cardiovascular Disease

## 2014-05-16 VITALS — BP 140/80 | HR 57 | Ht 72.0 in | Wt 172.0 lb

## 2014-05-16 DIAGNOSIS — I712 Thoracic aortic aneurysm, without rupture, unspecified: Secondary | ICD-10-CM

## 2014-05-16 DIAGNOSIS — I359 Nonrheumatic aortic valve disorder, unspecified: Secondary | ICD-10-CM

## 2014-05-16 DIAGNOSIS — Z5181 Encounter for therapeutic drug level monitoring: Secondary | ICD-10-CM

## 2014-05-16 DIAGNOSIS — Z7901 Long term (current) use of anticoagulants: Secondary | ICD-10-CM | POA: Diagnosis not present

## 2014-05-16 DIAGNOSIS — I10 Essential (primary) hypertension: Secondary | ICD-10-CM

## 2014-05-16 LAB — POCT INR: INR: 2.4
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
## 2014-05-16 NOTE — Patient Instructions (Signed)
Your physician wants you to follow-up in:  12 months.  You will receive a reminder letter in the mail two months in advance. If you don't receive a letter, please call our office to schedule the follow-up appointment.   

## 2014-05-22 ENCOUNTER — Ambulatory Visit (INDEPENDENT_AMBULATORY_CARE_PROVIDER_SITE_OTHER): Payer: Medicare Other | Admitting: Family

## 2014-05-22 ENCOUNTER — Encounter: Payer: Self-pay | Admitting: Family

## 2014-05-22 VITALS — BP 120/78 | HR 58 | Temp 97.7°F | Ht 72.0 in | Wt 174.0 lb

## 2014-05-22 DIAGNOSIS — R6884 Jaw pain: Secondary | ICD-10-CM

## 2014-05-22 MED ORDER — CYCLOBENZAPRINE HCL 10 MG PO TABS: 10.0000 mg | ORAL_TABLET | Freq: Every day | ORAL | Status: DC

## 2014-05-22 MED ORDER — CIPROFLOXACIN HCL 500 MG PO TABS: 500.0000 mg | ORAL_TABLET | Freq: Two times a day (BID) | ORAL | Status: DC

## 2014-05-22 NOTE — Progress Notes (Signed)
   Subjective:    Patient ID: Patrick [name], male    DOB: [birth year], 74 y.o.   MRN: [id number]   HPI:  Patrick [name] is a 74 y.o. male who presents today for an acute visit.  This is a new problem. Associated symptom of jaw pain has been going on for about 3 weeks plus. Indicates no obvious mechanism, but woke up one morning with it. Functionally decreased about to eat secondary pain. Pain is described as achy and pain intensity is dependent upon mouth movement. Has been on mobic for his shoulder which did not help with his jaw. Denies popping, snapping or locking of his jaw. Previous history of grinding his teeth.   Allergies  Allergen Reactions  . Codeine Rash    rash    Current Outpatient Prescriptions on File Prior to Visit  Medication Sig Dispense Refill  . amLODipine (NORVASC) 5 MG tablet Take 0.5 tablets (2.5 mg total) by mouth daily. 30 tablet 5  . amoxicillin (AMOXIL) 500 MG capsule take 1 capsule by mouth four times a day 20 capsule 3  . atorvastatin (LIPITOR) 10 MG tablet Take 1/2 tablet every other day    . desonide (DESOWEN) 0.05 % cream Apply 1 application topically daily as needed. 60 g 3  . meloxicam (MOBIC) 15 MG tablet Take 1 tablet (15 mg total) by mouth daily. 30 tablet 3  . saw palmetto 500 MG capsule Take 500 mg by mouth 2 (two) times daily.    . SUMAtriptan (IMITREX) 50 MG tablet take 1/2 tablet by mouth every 2 hours if needed as directed 10 tablet 5  . tadalafil (CIALIS) 5 MG tablet Take 1 tablet (5 mg total) by mouth daily. 90 tablet 3  . warfarin (COUMADIN) 7.5 MG tablet Take 1 tablet by mouth daily or as directed 90 tablet 1   No current facility-administered medications on file prior to visit.    Review of Systems  Constitutional: Negative for fever and chills.  HENT: Negative for tinnitus.        Positive for jaw pain.       Objective:    BP 120/78 mmHg  Pulse 58  Temp(Src) 97.7 F (36.5 C) (Oral)  Ht 6' (1.829 m)  Wt 174 lb (78.926 kg)   BMI 23.59 kg/m2  SpO2 97% Nursing note and vital signs reviewed.  Physical Exam  Constitutional: He is oriented to person, place, and time. He appears well-developed and well-nourished. No distress.  HENT:  Mouth/Throat: Uvula is midline, oropharynx is clear and moist and mucous membranes are normal. No oral lesions. Normal dentition. No dental abscesses, uvula swelling or dental caries.  No obvious deformity or discoloration. Mild edema of jaw medial to middle of left mandible. No obvious signs of infection. Mild tenderness of jaw present.  Cardiovascular: Normal rate, regular rhythm, normal heart sounds and intact distal pulses.   Pulmonary/Chest: Effort normal and breath sounds normal.  Neurological: He is alert and oriented to person, place, and time.  Skin: Skin is warm and dry.  Psychiatric: He has a normal mood and affect. His behavior is normal. Judgment and thought content normal.       Assessment & Plan:

## 2014-05-22 NOTE — Patient Instructions (Signed)
Thank you for choosing [facility].  Summary/Instructions:  Your prescription(s) have been submitted to your pharmacy or been printed and provided for you. Please take as directed and contact our office if you believe you are having problem(s) with the medication(s) or have any questions.  If your symptoms worsen or fail to improve, please contact our office for further instruction, or in case of emergency go directly to the emergency room at the closest medical facility.   Please follow up with your dentist regarding grinding your teeth.

## 2014-05-22 NOTE — Assessment & Plan Note (Signed)
Patient presents with jaw pain mainly on the left side. Cannot rule out temporomandibular disorder or temporomandibular joint dysfunction. Given history of grinding teeth this is most likely the cause of his jaw pain. Previous NSAIDs have not helped. Start cyclobenzaprine. Follow-up with dentistry for potential mouthguard. Follow-up if symptoms worsen or fail to improve.

## 2014-05-22 NOTE — Progress Notes (Signed)
Pre visit review using our clinic review tool, if applicable. No additional management support is needed unless otherwise documented below in the visit note. 

## 2014-06-05 ENCOUNTER — Ambulatory Visit (INDEPENDENT_AMBULATORY_CARE_PROVIDER_SITE_OTHER): Payer: Medicare Other | Admitting: *Deleted

## 2014-06-05 DIAGNOSIS — Z5181 Encounter for therapeutic drug level monitoring: Secondary | ICD-10-CM

## 2014-06-05 DIAGNOSIS — Z7901 Long term (current) use of anticoagulants: Secondary | ICD-10-CM

## 2014-06-05 DIAGNOSIS — I712 Thoracic aortic aneurysm, without rupture, unspecified: Secondary | ICD-10-CM

## 2014-06-05 DIAGNOSIS — I359 Nonrheumatic aortic valve disorder, unspecified: Secondary | ICD-10-CM

## 2014-06-05 LAB — POCT INR: INR: 1.5

## 2014-06-07 ENCOUNTER — Ambulatory Visit: Payer: Medicare Other | Admitting: Podiatrist

## 2014-06-13 ENCOUNTER — Ambulatory Visit (INDEPENDENT_AMBULATORY_CARE_PROVIDER_SITE_OTHER): Payer: Medicare Other | Admitting: *Deleted

## 2014-06-13 DIAGNOSIS — I712 Thoracic aortic aneurysm, without rupture, unspecified: Secondary | ICD-10-CM

## 2014-06-13 DIAGNOSIS — Z7901 Long term (current) use of anticoagulants: Secondary | ICD-10-CM | POA: Diagnosis not present

## 2014-06-13 DIAGNOSIS — I359 Nonrheumatic aortic valve disorder, unspecified: Secondary | ICD-10-CM

## 2014-06-13 DIAGNOSIS — Z5181 Encounter for therapeutic drug level monitoring: Secondary | ICD-10-CM

## 2014-06-13 LAB — POCT INR: INR: 1.5

## 2014-06-16 ENCOUNTER — Ambulatory Visit: Payer: Medicare Other | Admitting: Podiatrist

## 2014-06-23 ENCOUNTER — Ambulatory Visit (INDEPENDENT_AMBULATORY_CARE_PROVIDER_SITE_OTHER): Payer: Medicare Other | Admitting: *Deleted

## 2014-06-23 DIAGNOSIS — Z7901 Long term (current) use of anticoagulants: Secondary | ICD-10-CM | POA: Diagnosis not present

## 2014-06-23 DIAGNOSIS — I712 Thoracic aortic aneurysm, without rupture, unspecified: Secondary | ICD-10-CM

## 2014-06-23 DIAGNOSIS — Z5181 Encounter for therapeutic drug level monitoring: Secondary | ICD-10-CM

## 2014-06-23 DIAGNOSIS — I359 Nonrheumatic aortic valve disorder, unspecified: Secondary | ICD-10-CM | POA: Diagnosis not present

## 2014-06-23 LAB — POCT INR: INR: 2.2

## 2014-06-28 ENCOUNTER — Ambulatory Visit (INDEPENDENT_AMBULATORY_CARE_PROVIDER_SITE_OTHER): Payer: Medicare Other | Admitting: Podiatrist

## 2014-06-28 ENCOUNTER — Encounter: Payer: Self-pay | Admitting: Podiatrist

## 2014-06-28 DIAGNOSIS — B351 Tinea unguium: Secondary | ICD-10-CM

## 2014-06-30 NOTE — Progress Notes (Signed)
[name] presents today for his second round of lasering treatments on toenails 1, 2 and 5 of the right foot and the left hallux nail. We originally performed the laser treatment in the past which she states successfully cleared multiple nails and he has decided to undergo a second round of treatment of which she has purchased a second package.     Objective:   Physical Exam Objective: Mycotic appearance of nails 1, 2, 5 right foot and first toenail left foot are noted. The remainder of the nails have gone on to clear and appear normal.      Assessment & Plan:  Assessment: Mycotic toenails x4   Plan: Lasering carried out today without complication and all protocols were followed. I will see him back in 1-2 months for followup.

## 2014-07-03 DIAGNOSIS — L812 Freckles: Secondary | ICD-10-CM | POA: Diagnosis not present

## 2014-07-03 DIAGNOSIS — D1801 Hemangioma of skin and subcutaneous tissue: Secondary | ICD-10-CM | POA: Diagnosis not present

## 2014-07-03 DIAGNOSIS — Z85828 Personal history of other malignant neoplasm of skin: Secondary | ICD-10-CM | POA: Diagnosis not present

## 2014-07-03 DIAGNOSIS — L821 Other seborrheic keratosis: Secondary | ICD-10-CM | POA: Diagnosis not present

## 2014-07-03 DIAGNOSIS — L570 Actinic keratosis: Secondary | ICD-10-CM | POA: Diagnosis not present

## 2014-07-03 DIAGNOSIS — D225 Melanocytic nevi of trunk: Secondary | ICD-10-CM | POA: Diagnosis not present

## 2014-07-03 DIAGNOSIS — L218 Other seborrheic dermatitis: Secondary | ICD-10-CM | POA: Diagnosis not present

## 2014-07-04 ENCOUNTER — Telehealth: Payer: Self-pay | Admitting: Internal Medicine

## 2014-07-04 ENCOUNTER — Ambulatory Visit (INDEPENDENT_AMBULATORY_CARE_PROVIDER_SITE_OTHER): Payer: Medicare Other

## 2014-07-04 DIAGNOSIS — Z5181 Encounter for therapeutic drug level monitoring: Secondary | ICD-10-CM

## 2014-07-04 DIAGNOSIS — I712 Thoracic aortic aneurysm, without rupture, unspecified: Secondary | ICD-10-CM

## 2014-07-04 DIAGNOSIS — Z7901 Long term (current) use of anticoagulants: Secondary | ICD-10-CM

## 2014-07-04 DIAGNOSIS — K5730 Diverticulosis of large intestine without perforation or abscess without bleeding: Secondary | ICD-10-CM

## 2014-07-04 DIAGNOSIS — I359 Nonrheumatic aortic valve disorder, unspecified: Secondary | ICD-10-CM

## 2014-07-04 LAB — POCT INR: INR: 2.4

## 2014-07-04 NOTE — Telephone Encounter (Signed)
Pt called in and needs a referral to get a Colonoscopy

## 2014-07-04 NOTE — Telephone Encounter (Signed)
Patient has not been seen by you in the last year, please advise, thanks---i have left message on patients answering machine advising that dr plotnikov is out of office this week but will return next week and we will call him back

## 2014-07-05 NOTE — Telephone Encounter (Signed)
Ok: I'll will refer to GI. Is it for screening? He will need an appt w/GI due to coumadin. Who would he like to see? Thx Thx

## 2014-07-06 ENCOUNTER — Other Ambulatory Visit: Payer: Self-pay | Admitting: *Deleted

## 2014-07-06 MED ORDER — WARFARIN SODIUM 7.5 MG PO TABS: ORAL_TABLET | ORAL | Status: DC

## 2014-07-07 ENCOUNTER — Other Ambulatory Visit: Payer: Self-pay | Admitting: Internal Medicine

## 2014-07-10 NOTE — Telephone Encounter (Signed)
Ok Thx 

## 2014-07-10 NOTE — Telephone Encounter (Signed)
Patient wants to see gi here on 3rd floor, doesn't matter which doctor, and he is currently having INR managed by [address] location

## 2014-07-27 ENCOUNTER — Encounter: Payer: Self-pay | Admitting: Gastroenterology

## 2014-07-27 ENCOUNTER — Ambulatory Visit (INDEPENDENT_AMBULATORY_CARE_PROVIDER_SITE_OTHER): Payer: Medicare Other | Admitting: Pharmacist

## 2014-07-27 DIAGNOSIS — I712 Thoracic aortic aneurysm, without rupture, unspecified: Secondary | ICD-10-CM

## 2014-07-27 DIAGNOSIS — Z5181 Encounter for therapeutic drug level monitoring: Secondary | ICD-10-CM

## 2014-07-27 DIAGNOSIS — I359 Nonrheumatic aortic valve disorder, unspecified: Secondary | ICD-10-CM

## 2014-07-27 DIAGNOSIS — Z7901 Long term (current) use of anticoagulants: Secondary | ICD-10-CM

## 2014-07-27 LAB — POCT INR: INR: 3.5

## 2014-08-07 ENCOUNTER — Encounter: Payer: Self-pay | Admitting: Gastroenterology

## 2014-08-17 ENCOUNTER — Encounter: Payer: Self-pay | Admitting: Podiatry

## 2014-08-17 ENCOUNTER — Ambulatory Visit (INDEPENDENT_AMBULATORY_CARE_PROVIDER_SITE_OTHER): Payer: Medicare Other | Admitting: Podiatry

## 2014-08-17 VITALS — BP 155/92 | HR 60 | Resp 12

## 2014-08-17 DIAGNOSIS — B351 Tinea unguium: Secondary | ICD-10-CM

## 2014-08-17 MED ORDER — FLUCONAZOLE 150 MG PO TABS: ORAL_TABLET | ORAL | Status: DC

## 2014-08-18 ENCOUNTER — Ambulatory Visit (INDEPENDENT_AMBULATORY_CARE_PROVIDER_SITE_OTHER): Payer: Medicare Other

## 2014-08-18 DIAGNOSIS — I712 Thoracic aortic aneurysm, without rupture, unspecified: Secondary | ICD-10-CM

## 2014-08-18 DIAGNOSIS — Z7901 Long term (current) use of anticoagulants: Secondary | ICD-10-CM

## 2014-08-18 DIAGNOSIS — I359 Nonrheumatic aortic valve disorder, unspecified: Secondary | ICD-10-CM

## 2014-08-18 DIAGNOSIS — Z5181 Encounter for therapeutic drug level monitoring: Secondary | ICD-10-CM

## 2014-08-18 LAB — POCT INR: INR: 2.6

## 2014-08-19 NOTE — Progress Notes (Signed)
Subjective:     Patient ID: Patrick [name], male   DOB: Jun 26, 1940, 74 y.o.   MRN: [id number]  HPI patient has 3 nails that are giving him trouble and wants to have them looked at for possible treatment   Review of Systems     Objective:   Physical Exam Neurovascular status intact no health history changes with 3 nails that are quite yellow being present on the big toes and fourth    Assessment:     Mycotic nail infection    Plan:     Were placing him one pill a week Diflucan to toleration for 6 weeks and today laser was accomplished approximate 1500 pulses

## 2014-09-01 ENCOUNTER — Ambulatory Visit (INDEPENDENT_AMBULATORY_CARE_PROVIDER_SITE_OTHER): Payer: Medicare Other | Admitting: *Deleted

## 2014-09-01 DIAGNOSIS — Z7901 Long term (current) use of anticoagulants: Secondary | ICD-10-CM

## 2014-09-01 DIAGNOSIS — I359 Nonrheumatic aortic valve disorder, unspecified: Secondary | ICD-10-CM | POA: Diagnosis not present

## 2014-09-01 DIAGNOSIS — Z5181 Encounter for therapeutic drug level monitoring: Secondary | ICD-10-CM | POA: Diagnosis not present

## 2014-09-01 DIAGNOSIS — I712 Thoracic aortic aneurysm, without rupture, unspecified: Secondary | ICD-10-CM

## 2014-09-01 LAB — POCT INR: INR: 3.3

## 2014-09-14 ENCOUNTER — Telehealth: Payer: Self-pay | Admitting: Internal Medicine

## 2014-09-14 ENCOUNTER — Ambulatory Visit (INDEPENDENT_AMBULATORY_CARE_PROVIDER_SITE_OTHER): Payer: Medicare Other | Admitting: Pharmacist

## 2014-09-14 DIAGNOSIS — I712 Thoracic aortic aneurysm, without rupture, unspecified: Secondary | ICD-10-CM

## 2014-09-14 DIAGNOSIS — Z5181 Encounter for therapeutic drug level monitoring: Secondary | ICD-10-CM

## 2014-09-14 DIAGNOSIS — I359 Nonrheumatic aortic valve disorder, unspecified: Secondary | ICD-10-CM

## 2014-09-14 DIAGNOSIS — Z7901 Long term (current) use of anticoagulants: Secondary | ICD-10-CM

## 2014-09-14 LAB — POCT INR: INR: 6.5

## 2014-09-14 MED ORDER — TADALAFIL 5 MG PO TABS: 5.0000 mg | ORAL_TABLET | Freq: Every day | ORAL | Status: DC

## 2014-09-14 NOTE — Telephone Encounter (Signed)
Patient is requesting a prescription for tadalafil (CIALIS) 5 MG tablet [[id number].  He'll need the prescription for an online [place]

## 2014-09-14 NOTE — Telephone Encounter (Signed)
Done. Thx.

## 2014-09-14 NOTE — Telephone Encounter (Signed)
Left message with male who answered to have pt call back.   RE: I need the fax number to the [facility] to send the pt rx to.

## 2014-09-14 NOTE — Telephone Encounter (Signed)
Fax # is [phone]

## 2014-09-22 ENCOUNTER — Ambulatory Visit (INDEPENDENT_AMBULATORY_CARE_PROVIDER_SITE_OTHER): Payer: Medicare Other | Admitting: *Deleted

## 2014-09-22 DIAGNOSIS — Z7901 Long term (current) use of anticoagulants: Secondary | ICD-10-CM | POA: Diagnosis not present

## 2014-09-22 DIAGNOSIS — I712 Thoracic aortic aneurysm, without rupture, unspecified: Secondary | ICD-10-CM

## 2014-09-22 DIAGNOSIS — I359 Nonrheumatic aortic valve disorder, unspecified: Secondary | ICD-10-CM | POA: Diagnosis not present

## 2014-09-22 DIAGNOSIS — Z5181 Encounter for therapeutic drug level monitoring: Secondary | ICD-10-CM | POA: Diagnosis not present

## 2014-09-22 LAB — POCT INR: INR: 1.6

## 2014-09-28 ENCOUNTER — Telehealth: Payer: Self-pay

## 2014-09-28 NOTE — Telephone Encounter (Signed)
  09/28/2014   RE: Patrick [name] DOB: 1940/05/20 MRN: [id number]   Dear Dr. Angelena Form,    We have scheduled the above patient for an endoscopic procedure. Our records show that he is on anticoagulation therapy.   Please advise as to how long the patient may come off his therapy of coumadin prior to the procedure, which is scheduled for 10/16/14.  Please fax back/ or route the completed form to Marlon Pel, [place]  Sincerely,    Marlon Pel, CMA

## 2014-09-29 ENCOUNTER — Ambulatory Visit (INDEPENDENT_AMBULATORY_CARE_PROVIDER_SITE_OTHER): Payer: Medicare Other | Admitting: *Deleted

## 2014-09-29 DIAGNOSIS — Z5181 Encounter for therapeutic drug level monitoring: Secondary | ICD-10-CM | POA: Diagnosis not present

## 2014-09-29 DIAGNOSIS — I712 Thoracic aortic aneurysm, without rupture, unspecified: Secondary | ICD-10-CM

## 2014-09-29 DIAGNOSIS — I359 Nonrheumatic aortic valve disorder, unspecified: Secondary | ICD-10-CM | POA: Diagnosis not present

## 2014-09-29 DIAGNOSIS — Z7901 Long term (current) use of anticoagulants: Secondary | ICD-10-CM

## 2014-09-29 DIAGNOSIS — R972 Elevated prostate specific antigen [PSA]: Secondary | ICD-10-CM | POA: Diagnosis not present

## 2014-09-29 LAB — POCT INR: INR: 3.6

## 2014-09-29 NOTE — Telephone Encounter (Signed)
Information sent back to GI office.  No further letter required.

## 2014-09-29 NOTE — Telephone Encounter (Signed)
Pt has a mechanical AVR but no other risk factors for stroke.  Per protocol, okay to hold Coumadin x 5 days prior to procedure.  Pt has an appt on 8/16 to discuss instructions

## 2014-09-29 NOTE — Telephone Encounter (Signed)
Thanks. Do I need to write a letter stating this or does the coumadin clinic? Patrick [name]

## 2014-10-03 ENCOUNTER — Encounter: Payer: Self-pay | Admitting: Family Medicine

## 2014-10-03 ENCOUNTER — Other Ambulatory Visit (INDEPENDENT_AMBULATORY_CARE_PROVIDER_SITE_OTHER): Payer: Medicare Other

## 2014-10-03 ENCOUNTER — Ambulatory Visit (INDEPENDENT_AMBULATORY_CARE_PROVIDER_SITE_OTHER): Payer: Medicare Other | Admitting: Family Medicine

## 2014-10-03 VITALS — BP 122/70 | HR 56 | Ht 72.0 in | Wt 175.0 lb

## 2014-10-03 DIAGNOSIS — M129 Arthropathy, unspecified: Secondary | ICD-10-CM

## 2014-10-03 DIAGNOSIS — IMO0002 Reserved for concepts with insufficient information to code with codable children: Secondary | ICD-10-CM

## 2014-10-03 DIAGNOSIS — M25562 Pain in left knee: Secondary | ICD-10-CM

## 2014-10-03 DIAGNOSIS — M1990 Unspecified osteoarthritis, unspecified site: Secondary | ICD-10-CM | POA: Insufficient documentation

## 2014-10-03 NOTE — Telephone Encounter (Signed)
Ladene Artist, MD  Marzella Schlein, CMA           Yes, please do.       Previous Messages     ----- Message -----   From: Marzella Schlein, CMA   Sent: 10/02/2014  3:25 PM    To: Ladene Artist, MD   This patient is on your schedule for 10/11/14 to discuss colon and he is on coumadin. I noticed that they kept his colonoscopy date which is scheduled for 10/16/14. I took it upon myself so the patient would not be inconvenienced to go ahead and send a anticoag letter to his Cardiologist and the coumadin clinic. They answered me back stating patient can hold coumadin 5 days prior. Can I go ahead and at least contact the patient to let him know that he can hold the coumadin but give him instructions the day of the appt?

## 2014-10-03 NOTE — Telephone Encounter (Signed)
Patient notified to hold coumadin 5 days prior to his procedure per coumadin clinic and Dr. Angelena Form. Pt verbalized understanding.

## 2014-10-03 NOTE — Progress Notes (Signed)
Pre visit review using our clinic review tool, if applicable. No additional management support is needed unless otherwise documented below in the visit note. 

## 2014-10-03 NOTE — Assessment & Plan Note (Addendum)
Asian was given an injection today. We discussed home exercises icing protocol and patient will try topical anti-inflammatory. Patient come back in 3-4 weeks for further evaluation. Patient declined physical therapy as well as brace today. Patient will come back sooner if any instability is noted.

## 2014-10-03 NOTE — Patient Instructions (Signed)
Good to see you Ice 20 minutes after activity and before bed pennsaid pinkie amount topically 2 times daily as needed.  Stay active See me again in 3 weeks.

## 2014-10-03 NOTE — Progress Notes (Signed)
[name] [place], [place] [address] Phone: [phone] Subjective:    I'm seeing this patient by the request  of: Walker Kehr, MD   CC: right shoulder pain.   [id number] Patrick [name] is a 74 y.o. male coming in with complaint of left knee pain. Patient was playing golf and states that he had to stop his round secondary to the pain. Patient was given a twisting motion when he felt a severe pain. Patient has had this pain intermittently for multiple months if not years. Patient states that there is some feeling of a mild instability. Patient denies any swelling, denies any radiation of pain just seems to be on the medial aspect of the knee. Rates the severity of pain a 7 out of 10. States that he can even hurt him when he is trying to follow sleep.     Past medical history, social, surgical and family history all reviewed in electronic medical record.   Review of Systems: No headache, visual changes, nausea, vomiting, diarrhea, constipation, dizziness, abdominal pain, skin rash, fevers, chills, night sweats, weight loss, swollen lymph nodes, body aches, joint swelling, muscle aches, chest pain, shortness of breath, mood changes.   Objective Blood pressure 122/70, pulse 56, height 6' (1.829 m), weight 175 lb (79.379 kg), SpO2 98 %.  General: No apparent distress alert and oriented x3 mood and affect normal, dressed appropriately.  HEENT: Pupils equal, extraocular movements intact  Respiratory: Patient's speak in full sentences and does not appear short of breath  Cardiovascular: No lower extremity edema, non tender, no erythema  Skin: Warm dry intact with no signs of infection or rash on extremities or on axial skeleton.  Abdomen: Soft nontender  Neuro: Cranial nerves II through XII are intact, neurovascularly intact in all extremities with 2+ DTRs and 2+ pulses.  Lymph: No lymphadenopathy of posterior or anterior cervical chain or  axillae bilaterally.  Gait normal with good balance and coordination.  MSK:  Non tender with full range of motion and good stability and symmetric strength and tone of  elbows, wrist, hip, and ankles bilaterally.  Knee: Left Normal to inspection with no erythema or effusion or obvious bony abnormalities. Severe tenderness over the medial joint line ROM full in flexion and extension and lower leg rotation. Ligaments with solid consistent endpoints including ACL, PCL, LCL, MCL. Positive Mcmurray's, Apley's, and Thessalonian tests. Non painful patellar compression. Patellar glide with mild crepitus. Patellar and quadriceps tendons unremarkable. Hamstring and quadriceps strength is normal.  Contralateral knee unremarkable  MSK US performed of: Left This study was ordered, performed, and interpreted by Charlann Boxer D.O.  Knee: All structures visualized. Chronic degenerative changes of the medial meniscus noted with some mild displacement but no acute findings. Moderate osteophytic changes noted Patellar Tendon unremarkable on long and transverse views without effusion. No abnormality of prepatellar bursa. LCL and MCL unremarkable on long and transverse views. No abnormality of origin of medial or lateral head of the gastrocnemius.  IMPRESSION:  Moderate arthritis with chronic degenerative meniscus  Procedure: Real-time Ultrasound Guided Injection of left knee Device: GE Logiq E  Ultrasound guided injection is preferred based studies that show increased duration, increased effect, greater accuracy, decreased procedural pain, increased response rate, and decreased cost with ultrasound guided versus blind injection.  Verbal informed consent obtained.  Time-out conducted.  Noted no overlying erythema, induration, or other signs of local infection.  Skin prepped in a sterile fashion.  Local anesthesia:  Topical Ethyl chloride.  With sterile technique and under real time ultrasound guidance:  With a 22-gauge 2 inch needle patient was injected with 4 cc of 0.5% Marcaine and 1 cc of Kenalog 40 mg/dL. This was from a superior lateral approach.  Completed without difficulty  Pain immediately resolved suggesting accurate placement of the medication.  Advised to call if fevers/chills, erythema, induration, drainage, or persistent bleeding.  Images permanently stored and available for review in the ultrasound unit.  Impression: Technically successful ultrasound guided injection.       Impression and Recommendations:     This case required medical decision making of moderate complexity.

## 2014-10-06 ENCOUNTER — Telehealth: Payer: Self-pay | Admitting: *Deleted

## 2014-10-06 ENCOUNTER — Other Ambulatory Visit: Payer: Self-pay | Admitting: *Deleted

## 2014-10-06 DIAGNOSIS — N5201 Erectile dysfunction due to arterial insufficiency: Secondary | ICD-10-CM | POA: Diagnosis not present

## 2014-10-06 DIAGNOSIS — R972 Elevated prostate specific antigen [PSA]: Secondary | ICD-10-CM | POA: Diagnosis not present

## 2014-10-06 MED ORDER — ATORVASTATIN CALCIUM 10 MG PO TABS: 10.0000 mg | ORAL_TABLET | Freq: Every day | ORAL | Status: DC

## 2014-10-06 NOTE — Telephone Encounter (Signed)
Received call from Kristen/pharmacist Atorvastin need to clarify directions. Stacy sent rx in with two direction pls verify what pt need to take...Johny Chess

## 2014-10-06 NOTE — Telephone Encounter (Signed)
[name] does the pt need to take 1/2 tablet or whole tablet on the atorvastatin...Johny Chess

## 2014-10-06 NOTE — Telephone Encounter (Signed)
Pls ask the pt Thx

## 2014-10-09 MED ORDER — ATORVASTATIN CALCIUM 10 MG PO TABS: 5.0000 mg | ORAL_TABLET | ORAL | Status: DC

## 2014-10-09 NOTE — Telephone Encounter (Signed)
Called pt no answer LMOM RTC.../lmb 

## 2014-10-09 NOTE — Telephone Encounter (Signed)
Patient called back and he states he cuts a whole in half and so takes half a pill

## 2014-10-09 NOTE — Telephone Encounter (Signed)
Ok updated record & notified pharmacy with correct sig...Patrick [name]

## 2014-10-10 ENCOUNTER — Ambulatory Visit (INDEPENDENT_AMBULATORY_CARE_PROVIDER_SITE_OTHER): Payer: Medicare Other | Admitting: *Deleted

## 2014-10-10 DIAGNOSIS — Z5181 Encounter for therapeutic drug level monitoring: Secondary | ICD-10-CM

## 2014-10-10 DIAGNOSIS — I712 Thoracic aortic aneurysm, without rupture, unspecified: Secondary | ICD-10-CM

## 2014-10-10 DIAGNOSIS — I359 Nonrheumatic aortic valve disorder, unspecified: Secondary | ICD-10-CM | POA: Diagnosis not present

## 2014-10-10 DIAGNOSIS — Z7901 Long term (current) use of anticoagulants: Secondary | ICD-10-CM | POA: Diagnosis not present

## 2014-10-10 LAB — POCT INR: INR: 1.5

## 2014-10-11 ENCOUNTER — Encounter: Payer: Self-pay | Admitting: Gastroenterology

## 2014-10-11 ENCOUNTER — Ambulatory Visit (INDEPENDENT_AMBULATORY_CARE_PROVIDER_SITE_OTHER): Payer: Medicare Other | Admitting: Gastroenterology

## 2014-10-11 VITALS — BP 128/66 | HR 65 | Ht 72.0 in | Wt 171.2 lb

## 2014-10-11 DIAGNOSIS — Z1211 Encounter for screening for malignant neoplasm of colon: Secondary | ICD-10-CM

## 2014-10-11 DIAGNOSIS — Z7901 Long term (current) use of anticoagulants: Secondary | ICD-10-CM

## 2014-10-11 MED ORDER — NA SULFATE-K SULFATE-MG SULF 17.5-3.13-1.6 GM/177ML PO SOLN: 1.0000 | Freq: Once | ORAL | Status: DC

## 2014-10-11 NOTE — Patient Instructions (Signed)
You have been scheduled for a colonoscopy. Please follow written instructions given to you at your visit today.  Please pick up your prep supplies at the pharmacy within the next 1-3 days. If you use inhalers (even only as needed), please bring them with you on the day of your procedure.  Thank you for choosing me and [facility].  Malcolm T. Stark, Jr., MD., FACG  

## 2014-10-11 NOTE — Progress Notes (Addendum)
    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 He previously underwent colonoscopy in February 2005 which showed sigmoid colon diverticulosis and was otherwise unremarkable. He has no gastrointestinal complaints. Denies weight loss, abdominal pain, constipation, diarrhea, change in stool caliber, melena, hematochezia, nausea, vomiting, dysphagia, reflux symptoms, chest pain.   Review of Systems: Pertinent positive and negative review of systems were noted in the above HPI section. All other review of systems were otherwise negative.  Current Medications, Allergies, Past Medical History, Past Surgical History, Family History and Social History were reviewed in [employer] record.  Physical Exam: General: Well developed, well nourished, no acute distress Head: Normocephalic and atraumatic Eyes:  sclerae anicteric, EOMI Ears: Normal auditory acuity Mouth: No deformity or lesions Neck: Supple, no masses or thyromegaly Lungs: Clear throughout to auscultation Heart: Regular rate and rhythm; no murmurs, rubs or bruits Abdomen: Soft, non tender and non distended. No masses, hepatosplenomegaly or hernias noted. Normal Bowel sounds Rectal: deferred to colonoscopy Musculoskeletal: Symmetrical with no gross deformities  Skin: No lesions on visible extremities Pulses:  Normal pulses noted Extremities: No clubbing, cyanosis, edema or deformities noted Neurological: Alert oriented x 4, grossly nonfocal Cervical Nodes:  No significant cervical adenopathy Inguinal Nodes: No significant inguinal adenopathy Psychological:  Alert and cooperative. Normal mood and affect  Assessment and Recommendations:  1. CRC screening, average risk. The risks (including bleeding, perforation, infection, missed lesions, medication reactions and possible hospitalization or  surgery if complications occur), benefits, and alternatives to colonoscopy with possible biopsy and possible polypectomy were discussed with the patient and they consent to proceed.   2. 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 Hold warfarin 5 days before procedure - will instruct when and how to resume after procedure. Low but real risk of cardiovascular event such as heart attack, stroke, embolism, thrombosis or ischemia/infarct of other organs off warfarin explained and need to seek urgent help if this occurs. The patient consents to proceed. Will communicate by phone or EMR with patient's prescribing provider to confirm that holding warfarin is reasonable in this case.    cc: Cassandria Anger, MD [address] [place], [place] [address]

## 2014-10-13 ENCOUNTER — Encounter: Payer: Self-pay | Admitting: Gastroenterology

## 2014-10-16 ENCOUNTER — Ambulatory Visit (AMBULATORY_SURGERY_CENTER): Payer: Medicare Other | Admitting: Gastroenterology

## 2014-10-16 ENCOUNTER — Encounter: Payer: Self-pay | Admitting: Gastroenterology

## 2014-10-16 VITALS — BP 156/69 | HR 52 | Temp 96.8°F | Resp 16 | Ht 72.0 in | Wt 171.0 lb

## 2014-10-16 DIAGNOSIS — Z886 Allergy status to analgesic agent status: Secondary | ICD-10-CM | POA: Diagnosis not present

## 2014-10-16 DIAGNOSIS — Z1211 Encounter for screening for malignant neoplasm of colon: Secondary | ICD-10-CM

## 2014-10-16 DIAGNOSIS — I10 Essential (primary) hypertension: Secondary | ICD-10-CM | POA: Diagnosis not present

## 2014-10-16 DIAGNOSIS — D120 Benign neoplasm of cecum: Secondary | ICD-10-CM

## 2014-10-16 DIAGNOSIS — M545 Low back pain: Secondary | ICD-10-CM | POA: Diagnosis not present

## 2014-10-16 DIAGNOSIS — G43909 Migraine, unspecified, not intractable, without status migrainosus: Secondary | ICD-10-CM | POA: Diagnosis not present

## 2014-10-16 MED ORDER — SODIUM CHLORIDE 0.9 % IV SOLN: 500.0000 mL | INTRAVENOUS | Status: DC

## 2014-10-16 NOTE — Patient Instructions (Signed)
Discharge instructions given. ]handouts on polyps and diverticulosis. Hold aspirin and all other NSAIDS for 2 weeks. Resume Coumadin today. Check PT/INR WITHIN 1 WEEK. YOU HAD AN ENDOSCOPIC PROCEDURE TODAY AT [facility]:   Refer to the procedure report that was given to you for any specific questions about what was found during the examination.  If the procedure report does not answer your questions, please call your gastroenterologist to clarify.  If you requested that your care partner not be given the details of your procedure findings, then the procedure report has been included in a sealed envelope for you to review at your convenience later.  YOU SHOULD EXPECT: Some feelings of bloating in the abdomen. Passage of more gas than usual.  Walking can help get rid of the air that was put into your GI tract during the procedure and reduce the bloating. If you had a lower endoscopy (such as a colonoscopy or flexible sigmoidoscopy) you may notice spotting of blood in your stool or on the toilet paper. If you underwent a bowel prep for your procedure, you may not have a normal bowel movement for a few days.  Please Note:  You might notice some irritation and congestion in your nose or some drainage.  This is from the oxygen used during your procedure.  There is no need for concern and it should clear up in a day or so.  SYMPTOMS TO REPORT IMMEDIATELY:   Following lower endoscopy (colonoscopy or flexible sigmoidoscopy):  Excessive amounts of blood in the stool  Significant tenderness or worsening of abdominal pains  Swelling of the abdomen that is new, acute  Fever of 100F or higher   For urgent or emergent issues, a gastroenterologist can be reached at any hour by calling [phone].   DIET: Your first meal following the procedure should be a small meal and then it is ok to progress to your normal diet. Heavy or fried foods are harder to digest and may make you feel  nauseous or bloated.  Likewise, meals heavy in dairy and vegetables can increase bloating.  Drink plenty of fluids but you should avoid alcoholic beverages for 24 hours.  ACTIVITY:  You should plan to take it easy for the rest of today and you should NOT DRIVE or use heavy machinery until tomorrow (because of the sedation medicines used during the test).    FOLLOW UP: Our staff will call the number listed on your records the next business day following your procedure to check on you and address any questions or concerns that you may have regarding the information given to you following your procedure. If we do not reach you, we will leave a message.  However, if you are feeling well and you are not experiencing any problems, there is no need to return our call.  We will assume that you have returned to your regular daily activities without incident.  If any biopsies were taken you will be contacted by phone or by letter within the next 1-3 weeks.  Please call us at [phone] if you have not heard about the biopsies in 3 weeks.    SIGNATURES/CONFIDENTIALITY: You and/or your care partner have signed paperwork which will be entered into your electronic medical record.  These signatures attest to the fact that that the information above on your After Visit Summary has been reviewed and is understood.  Full responsibility of the confidentiality of this discharge information lies with you and/or your care-partner.

## 2014-10-16 NOTE — Op Note (Addendum)
[place]  [address] [place], [address]   COLONOSCOPY PROCEDURE REPORT PATIENT: Patrick [name], Patrick [name]  MR#: [id number] BIRTHDATE: 07/07/1940 , 74  yrs. old GENDER: male ENDOSCOPIST: Ladene Artist, MD, [facility] PROCEDURE DATE:  10/16/2014 PROCEDURE:   Colonoscopy, screening and Colonoscopy with snare polypectomy First Screening Colonoscopy - Avg.  risk and is 50 yrs.  old or older - No.  Prior Negative Screening - Now for repeat screening. 10 or more years since last screening  History of Adenoma - Now for follow-up colonoscopy & has been > or = to 3 yrs.  N/A  Polyps removed today? Yes Polyps Removed Today ASA CLASS:   Class III INDICATIONS:Screening for colonic neoplasia and Colorectal Neoplasm Risk Assessment for this procedure is average risk. MEDICATIONS: Monitored anesthesia care and Propofol 200 mg IV DESCRIPTION OF PROCEDURE:   After the risks benefits and alternatives of the procedure were thoroughly explained, informed consent was obtained.  The digital rectal exam revealed no abnormalities of the rectum.   The LB [id number]-H190 [id number]  endoscope was introduced through the anus and advanced to the cecum, which was identified by both the appendix and ileocecal valve. No adverse events experienced.   The quality of the prep was excellent. (Suprep was used)  The instrument was then slowly withdrawn as the colon was fully examined. Estimated blood loss is zero unless otherwise noted in this procedure report.    COLON FINDINGS: A sessile polyp measuring 5 mm in size was found at the cecum.  A polypectomy was performed with a cold snare.  The resection was complete, the polyp tissue was completely retrieved and sent to histology.   There was moderate diverticulosis noted in the sigmoid colon and descending colon with associated luminal narrowing and muscular hypertrophy.   The examination was otherwise normal.  Retroflexed views revealed no abnormalities. The time  to cecum = 4.0 Withdrawal time = 10.2   The scope was withdrawn and the procedure completed. COMPLICATIONS: There were no immediate complications.  ENDOSCOPIC IMPRESSION: 1.   Sessile polyp at the cecum; polypectomy performed with a cold snare 2.   Moderate diverticulosis in the sigmoid colon and descending colon 3.   The examination was otherwise normal  RECOMMENDATIONS: 1.  Hold Aspirin and all other NSAIDS for 2 weeks. 2.  Await pathology results 3.  Repeat colonoscopy in 5 years if polyp adenomatous; otherwise, given your age, no plans for future screening colonoscopies. These type of exam usually stop around age 72. 64.  Resume Coumadin (warfarin) today and have your PT/INR checked within 1 week.  eSigned:  Ladene Artist, MD, [facility] 10/16/2014 8:50 AM Revised: 10/16/2014 8:50 AM

## 2014-10-16 NOTE — Progress Notes (Signed)
Called to room to assist during endoscopic procedure.  Patient ID and intended procedure confirmed with present staff. Received instructions for my participation in the procedure from the performing physician.  

## 2014-10-16 NOTE — Progress Notes (Signed)
Report to PACU, RN, vss, BBS= Clear.  

## 2014-10-17 ENCOUNTER — Telehealth: Payer: Self-pay

## 2014-10-17 NOTE — Telephone Encounter (Signed)
  Follow up Call-  Call back number 10/16/2014  Post procedure Call Back phone  # [phone]  Permission to leave phone message Yes     Patient questions:  Do you have a fever, pain , or abdominal swelling? No. Pain Score  0 *  Have you tolerated food without any problems? Yes.    Have you been able to return to your normal activities? Yes.    Do you have any questions about your discharge instructions: Diet   No. Medications  No. Follow up visit  No.  Do you have questions or concerns about your Care? No.  Actions: * If pain score is 4 or above: No action needed, pain <4.

## 2014-10-23 ENCOUNTER — Encounter: Payer: Self-pay | Admitting: Gastroenterology

## 2014-10-23 ENCOUNTER — Ambulatory Visit (INDEPENDENT_AMBULATORY_CARE_PROVIDER_SITE_OTHER): Payer: Medicare Other | Admitting: *Deleted

## 2014-10-23 DIAGNOSIS — I359 Nonrheumatic aortic valve disorder, unspecified: Secondary | ICD-10-CM

## 2014-10-23 DIAGNOSIS — I712 Thoracic aortic aneurysm, without rupture, unspecified: Secondary | ICD-10-CM

## 2014-10-23 DIAGNOSIS — Z5181 Encounter for therapeutic drug level monitoring: Secondary | ICD-10-CM | POA: Diagnosis not present

## 2014-10-23 DIAGNOSIS — Z7901 Long term (current) use of anticoagulants: Secondary | ICD-10-CM

## 2014-10-23 LAB — POCT INR: INR: 2.1

## 2014-10-31 ENCOUNTER — Telehealth: Payer: Self-pay | Admitting: Family Medicine

## 2014-10-31 NOTE — Telephone Encounter (Signed)
Scheduled pt for 9am tomorrow.

## 2014-10-31 NOTE — Telephone Encounter (Signed)
Had to cancel appt for Friday.  He is requesting to be worked in sooner

## 2014-10-31 NOTE — Telephone Encounter (Signed)
Left msg for pt to return call.

## 2014-11-01 ENCOUNTER — Ambulatory Visit: Payer: Medicare Other | Admitting: Family Medicine

## 2014-11-02 ENCOUNTER — Other Ambulatory Visit (INDEPENDENT_AMBULATORY_CARE_PROVIDER_SITE_OTHER): Payer: Medicare Other

## 2014-11-02 ENCOUNTER — Ambulatory Visit (INDEPENDENT_AMBULATORY_CARE_PROVIDER_SITE_OTHER)
Admission: RE | Admit: 2014-11-02 | Discharge: 2014-11-02 | Disposition: A | Discharge status: Home / Self Care | Payer: Medicare Other | Source: Ambulatory Visit | Attending: Family Medicine | Admitting: Family Medicine

## 2014-11-02 ENCOUNTER — Ambulatory Visit (INDEPENDENT_AMBULATORY_CARE_PROVIDER_SITE_OTHER): Payer: Medicare Other | Admitting: Family Medicine

## 2014-11-02 ENCOUNTER — Encounter: Payer: Self-pay | Admitting: Family Medicine

## 2014-11-02 VITALS — BP 122/82 | HR 53 | Wt 174.0 lb

## 2014-11-02 DIAGNOSIS — M1712 Unilateral primary osteoarthritis, left knee: Secondary | ICD-10-CM | POA: Diagnosis not present

## 2014-11-02 DIAGNOSIS — M25562 Pain in left knee: Secondary | ICD-10-CM

## 2014-11-02 DIAGNOSIS — M2352 Chronic instability of knee, left knee: Secondary | ICD-10-CM

## 2014-11-02 DIAGNOSIS — M25362 Other instability, left knee: Secondary | ICD-10-CM

## 2014-11-02 DIAGNOSIS — M2350 Chronic instability of knee, unspecified knee: Secondary | ICD-10-CM | POA: Insufficient documentation

## 2014-11-02 NOTE — Assessment & Plan Note (Signed)
Patient's pain is still consistent more with a meniscal injury itself of the medial not lateral aspect of the meniscus. We discussed different treatment options and patient elected to try conservative therapy for another 3 weeks. We discussed the possibility of needing advanced imaging the patient has a pacemaker and is unable to do an MRI. Imaging of the ultrasound is consistent and likely enough if surgical intervention is necessary. Patient likely has mild to moderate osteophytic changes and we will get x-rays to further evaluate. This immediately would be a candidate for arthroscopic procedure if needed. Patient will call me in 1-2 weeks until me how he is doing. At that time if any worsening symptoms I would consider referral for surgical intervention. Otherwise hopefully patient will heal in the next 6 weeks conservatively.  Spent  25 minutes with patient face-to-face and had greater than 50% of counseling including as described above in assessment and plan.

## 2014-11-02 NOTE — Patient Instructions (Signed)
Good to see you Lets try physical therapy they will call you Ice is your friend do 2 times daily Wear brace daily for next week then go back to compression sleeve.  We will get xray today and no news is good news  I would consider limiting golf to 1 time a week for 3 weeks.  See me again in 3 weeks.

## 2014-11-02 NOTE — Progress Notes (Signed)
  [name] [address] [place], [place] [address] Phone: [phone] Subjective:    I'm seeing this patient by the request  of: Walker Kehr, MD   CC: right shoulder pain.   [id number] Patrick [name] is a 74 y.o. male coming in with complaint of left knee pain. 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 Patient elected try conservative therapy and injection. States that it approximately do the exercises one may be 2 times a week but continued to have pain. Patient states that maybe he has made 15-20% improvement. Still has a sensation when he makes a twisting motion of severe pain on the medial aspect of the knee. States that going downhill can be very difficult as well as going down stairs. States that it does have some instability noted.     Past medical history, social, surgical and family history all reviewed in electronic medical record.   Review of Systems: No headache, visual changes, nausea, vomiting, diarrhea, constipation, dizziness, abdominal pain, skin rash, fevers, chills, night sweats, weight loss, swollen lymph nodes, body aches, joint swelling, muscle aches, chest pain, shortness of breath, mood changes.   Objective Blood pressure 122/82, pulse 53, weight 174 lb (78.926 kg), SpO2 99 %.  General: No apparent distress alert and oriented x3 mood and affect normal, dressed appropriately.  HEENT: Pupils equal, extraocular movements intact  Respiratory: Patient's speak in full sentences and does not appear short of breath  Cardiovascular: No lower extremity edema, non tender, no erythema  Skin: Warm dry intact with no signs of infection or rash on extremities or on axial skeleton.  Abdomen: Soft nontender  Neuro: Cranial nerves II through XII are intact, neurovascularly intact in all extremities with 2+ DTRs and 2+ pulses.  Lymph: No lymphadenopathy of posterior or  anterior cervical chain or axillae bilaterally.  Gait normal with good balance and coordination.  MSK:  Non tender with full range of motion and good stability and symmetric strength and tone of  elbows, wrist, hip, and ankles bilaterally.  Knee: Left Normal to inspection with no erythema or effusion or obvious bony abnormalities. Severe tenderness over the medial joint line ROM full in flexion and extension and lower leg rotation. Ligaments with solid consistent endpoints including ACL, PCL, LCL, MCL. Pain though with valgus stress Positive Mcmurray's, Apley's, and Thessalonian tests. Non painful patellar compression. Patellar glide with mild crepitus. Patellar and quadriceps tendons unremarkable. Hamstring and quadriceps strength is normal.  Contralateral knee unremarkable  MSK US performed of: Left This study was ordered, performed, and interpreted by Charlann Boxer D.O.  Knee: All structures visualized. Chronic degenerative changes of the medial meniscus noted with continued to space been but decreased hypoechoic changes Patellar Tendon unremarkable on long and transverse views without effusion. No abnormality of prepatellar bursa. LCL and MCL unremarkable on long and transverse views. No abnormality of origin of medial or lateral head of the gastrocnemius.  IMPRESSION:  Moderate arthritis with chronic degenerative meniscus with continued displacement        Impression and Recommendations:     This case required medical decision making of moderate complexity.

## 2014-11-02 NOTE — Progress Notes (Signed)
Pre visit review using our clinic review tool, if applicable. No additional management support is needed unless otherwise documented below in the visit note. 

## 2014-11-03 ENCOUNTER — Ambulatory Visit: Payer: Medicare Other | Admitting: Family Medicine

## 2014-11-07 ENCOUNTER — Telehealth: Payer: Self-pay | Admitting: Cardiovascular Disease

## 2014-11-07 NOTE — Telephone Encounter (Signed)
New message    What dental office are you calling from? Dr Docia Barrier What is your office phone and fax number? Fax [phone] 1. What type of procedure is the patient having performed? Tooth extraction  2. What date is procedure scheduled?  Not scheduled  3. What is your question (ex. Antibiotics prior to procedure, holding medication-we need to know how long dentist wants pt to hold med)?  Can pt hold coumadin and if so for how long

## 2014-11-07 NOTE — Telephone Encounter (Signed)
[facility] for patient to hold Coumadin  X 5 days prior to procedure.  Spoke with pt.  His tooth broke in half today.  Will start holding now in anticipation of removal more quickly.  Information faxed to Dr. Laurice Record office.

## 2014-11-07 NOTE — Telephone Encounter (Signed)
Addendum to previous note: Discussed clearance with Elberta Leatherwood, PharmD.  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

## 2014-11-07 NOTE — Telephone Encounter (Signed)
Will forward to Dr. Angelena Form for review and advisement on pt holding Coumadin for tooth extraction.

## 2014-11-08 NOTE — Telephone Encounter (Signed)
Agree. Thanks

## 2014-11-10 ENCOUNTER — Encounter: Payer: Self-pay | Admitting: Family Medicine

## 2014-11-10 ENCOUNTER — Ambulatory Visit (INDEPENDENT_AMBULATORY_CARE_PROVIDER_SITE_OTHER): Payer: Medicare Other | Admitting: Family Medicine

## 2014-11-10 ENCOUNTER — Other Ambulatory Visit (INDEPENDENT_AMBULATORY_CARE_PROVIDER_SITE_OTHER): Payer: Medicare Other

## 2014-11-10 VITALS — BP 114/72 | HR 54 | Ht 72.0 in | Wt 171.0 lb

## 2014-11-10 DIAGNOSIS — S42432A Displaced fracture (avulsion) of lateral epicondyle of left humerus, initial encounter for closed fracture: Secondary | ICD-10-CM | POA: Diagnosis not present

## 2014-11-10 DIAGNOSIS — M25522 Pain in left elbow: Secondary | ICD-10-CM

## 2014-11-10 NOTE — Progress Notes (Signed)
  [name] [address] [place], [place] [address] Phone: [phone] Subjective:     CC: Left elbow pain  Patrick [name] is a 74 y.o. male coming in with complaint of left elbow pain. Patient states he was putting golf and went to swelling and had severe pain over the left elbow. Patient states then had swelling and significant bruising the next 24 hours. Has been icing it and wearing compression. Noticed that that is improving. Patient initially though did have significant weakness in his wrist as well as hand. Patient states that it seems to be improving slowly. Rates the severity of pain when it occurred as 10 out of 10 and now 6 out of 10. Has taken anti-inflammatory one time and states it was helpful. States that the icing seems to be the best. No numbness noted.     Past medical history, social, surgical and family history all reviewed in electronic medical record.   Review of Systems: No headache, visual changes, nausea, vomiting, diarrhea, constipation, dizziness, abdominal pain, skin rash, fevers, chills, night sweats, weight loss, swollen lymph nodes, body aches, joint swelling, muscle aches, chest pain, shortness of breath, mood changes.   Objective Blood pressure 114/72, pulse 54, height 6' (1.829 m), weight 171 lb (77.565 kg), SpO2 99 %.  General: No apparent distress alert and oriented x3 mood and affect normal, dressed appropriately.  HEENT: Pupils equal, extraocular movements intact  Respiratory: Patient's speak in full sentences and does not appear short of breath  Cardiovascular: No lower extremity edema, non tender, no erythema  Skin: Warm dry intact with no signs of infection or rash on extremities or on axial skeleton.  Abdomen: Soft nontender  Neuro: Cranial nerves II through XII are intact, neurovascularly intact in all extremities with 2+ DTRs and 2+ pulses.  Lymph: No lymphadenopathy of posterior or anterior cervical  chain or axillae bilaterally.  Gait normal with good balance and coordination.  MSK:  Non tender with full range of motion and good stability and symmetric strength and tone of shoulders,  wrist, hip, knee and ankles bilaterally.   Elbow: Left Intermittent swelling noted over the lateral epicondylar region. Patient does have some weakness with resisted wrist extension but full range of motion of the elbow Stable to varus, valgus stress. Negative moving valgus stress test. Severely tender to palpation over the common extensor muscle Ulnar nerve does not sublux. Negative cubital tunnel Tinel's. Contralateral elbow unremarkable  Musculoskeletal ultrasound was performed and interpreted by Charlann Boxer D.O.   Elbow: Left Lateral epicondyle and common extensor tendon origin visualized. Avulsion noted with significant hypoechoic changes with what seems to be a full-thickness tear of the common  extensor tendon  Radial head unremarkable and located in annular ligament Medial epicondyle and common flexor tendon origin visualized.  No edema, effusions, or avulsions seen. Ulnar nerve in cubital tunnel unremarkable. Olecranon and triceps insertion visualized and unremarkable without edema, effusion, or avulsion.  No signs olecranon bursitis. Power doppler signal normal.  IMPRESSION:  Complete tear of the extensor common tendon     Impression and Recommendations:     This case required medical decision making of moderate complexity.

## 2014-11-10 NOTE — Patient Instructions (Signed)
Good to see you Wrist brace day and night for 2 weeks then nightly for another 2 weeks.  Ice is your friend as much as you want pennsaid pinkie amount topically 2 times daily as needed.  Avoid really any lifting if possible See me after [place]

## 2014-11-10 NOTE — Progress Notes (Signed)
Pre visit review using our clinic review tool, if applicable. No additional management support is needed unless otherwise documented below in the visit note. 

## 2014-11-10 NOTE — Assessment & Plan Note (Signed)
Patient doing well overall. Full thickness tear on u/s today.  Concern for possible not healing but strength is good today, mild weakness likely more from pain.  Wrist brace, avoid extension, ice,topical nsaids and patient will stop golf. Patient will do this but will be traveling out of the country for the next 2 weeks. Patient will come back afterwards and we will further evaluate with another ultrasound. Patient will call if any significant weakness or numbness occurs.

## 2014-11-20 ENCOUNTER — Ambulatory Visit (INDEPENDENT_AMBULATORY_CARE_PROVIDER_SITE_OTHER): Payer: Medicare Other | Admitting: *Deleted

## 2014-11-20 DIAGNOSIS — I359 Nonrheumatic aortic valve disorder, unspecified: Secondary | ICD-10-CM | POA: Diagnosis not present

## 2014-11-20 DIAGNOSIS — I712 Thoracic aortic aneurysm, without rupture, unspecified: Secondary | ICD-10-CM

## 2014-11-20 DIAGNOSIS — Z5181 Encounter for therapeutic drug level monitoring: Secondary | ICD-10-CM

## 2014-11-20 DIAGNOSIS — Z7901 Long term (current) use of anticoagulants: Secondary | ICD-10-CM | POA: Diagnosis not present

## 2014-11-20 LAB — POCT INR: INR: 1.8

## 2014-11-23 ENCOUNTER — Ambulatory Visit: Payer: Medicare Other | Admitting: Family Medicine

## 2014-12-06 ENCOUNTER — Ambulatory Visit: Payer: Medicare Other | Admitting: Family Medicine

## 2014-12-11 ENCOUNTER — Ambulatory Visit (INDEPENDENT_AMBULATORY_CARE_PROVIDER_SITE_OTHER): Payer: Medicare Other | Admitting: Family Medicine

## 2014-12-11 ENCOUNTER — Other Ambulatory Visit (INDEPENDENT_AMBULATORY_CARE_PROVIDER_SITE_OTHER): Payer: Medicare Other

## 2014-12-11 ENCOUNTER — Encounter: Payer: Self-pay | Admitting: Family Medicine

## 2014-12-11 ENCOUNTER — Ambulatory Visit (INDEPENDENT_AMBULATORY_CARE_PROVIDER_SITE_OTHER): Payer: Medicare Other | Admitting: *Deleted

## 2014-12-11 ENCOUNTER — Ambulatory Visit: Payer: Medicare Other | Admitting: Family Medicine

## 2014-12-11 VITALS — BP 134/80 | HR 65 | Ht 72.0 in | Wt 175.0 lb

## 2014-12-11 DIAGNOSIS — S42432A Displaced fracture (avulsion) of lateral epicondyle of left humerus, initial encounter for closed fracture: Secondary | ICD-10-CM

## 2014-12-11 DIAGNOSIS — Z5181 Encounter for therapeutic drug level monitoring: Secondary | ICD-10-CM | POA: Diagnosis not present

## 2014-12-11 DIAGNOSIS — I712 Thoracic aortic aneurysm, without rupture, unspecified: Secondary | ICD-10-CM

## 2014-12-11 DIAGNOSIS — M25362 Other instability, left knee: Secondary | ICD-10-CM

## 2014-12-11 DIAGNOSIS — Z7901 Long term (current) use of anticoagulants: Secondary | ICD-10-CM

## 2014-12-11 DIAGNOSIS — I359 Nonrheumatic aortic valve disorder, unspecified: Secondary | ICD-10-CM | POA: Diagnosis not present

## 2014-12-11 DIAGNOSIS — M25522 Pain in left elbow: Secondary | ICD-10-CM

## 2014-12-11 DIAGNOSIS — M25562 Pain in left knee: Secondary | ICD-10-CM | POA: Diagnosis not present

## 2014-12-11 DIAGNOSIS — M2352 Chronic instability of knee, left knee: Secondary | ICD-10-CM

## 2014-12-11 LAB — POCT INR: INR: 2.4

## 2014-12-11 NOTE — Progress Notes (Signed)
[name] [address] [place], [place] [address] Phone: [phone] Subjective:     CC: Left elbow pain  [id number] Patrick [name] is a 74 y.o. male coming in with complaint of left elbow pain. Patient was found to have a full-thickness tear of the aches extensor common tendon. Patient was put in a wrist brace. Patient had to go out of town for the last 3 weeks. Coming back. Started doing some the exercises and has some discomfort but states that he has noticed significant improvement in strength by about 50-75%. Patient denies any numbness, denies any significant weakness and states once again everyday seems to be better. Continues to wear the brace on a very regular basis. Comes out and does the wrist exercises 2-3 times daily.   Patient is still complaining of left knee pain. Patient was seen quite some time and seems to be doing better. Patient was found to have some mild arthritis but otherwise fairly unremarkable. Patient did take a step on some vacation and felt a severe pain on the medial aspect and knee and since then does not feel correct. 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 Patient is having pain when the knee feels unstable.     Past medical history, social, surgical and family history all reviewed in electronic medical record.   Review of Systems: No headache, visual changes, nausea, vomiting, diarrhea, constipation, dizziness, abdominal pain, skin rash, fevers, chills, night sweats, weight loss, swollen lymph nodes, body aches, joint swelling, muscle aches, chest pain, shortness of breath, mood changes.   Objective Blood pressure 134/80, pulse 65, height 6' (1.829 m), weight 175 lb (79.379 kg), SpO2 97 %.  General: No apparent distress alert and oriented x3 mood and affect normal, dressed appropriately.  HEENT: Pupils equal, extraocular movements intact  Respiratory: Patient's speak in full  sentences and does not appear short of breath  Cardiovascular: No lower extremity edema, non tender, no erythema  Skin: Warm dry intact with no signs of infection or rash on extremities or on axial skeleton.  Abdomen: Soft nontender  Neuro: Cranial nerves II through XII are intact, neurovascularly intact in all extremities with 2+ DTRs and 2+ pulses.  Lymph: No lymphadenopathy of posterior or anterior cervical chain or axillae bilaterally.  Gait normal with good balance and coordination.  MSK:  Non tender with full range of motion and good stability and symmetric strength and tone of shoulders,  wrist, hip, and ankles bilaterally.   Knee: Left Normal to inspection with no erythema or effusion or obvious bony abnormalities. Severe tenderness to palpation over the medial joint line ROM full in flexion and extension and lower leg rotation. Ligaments with solid consistent endpoints including ACL, PCL, LCL, MCL. Positive Mcmurray's, Apley's, and Thessalonian tests. Non painful patellar compression. Patellar glide with mild crepitus. Patellar and quadriceps tendons unremarkable. Hamstring and quadriceps strength is normal.    Elbow: Left Decreased swelling compared to previous exam. Still tender to palpation over the lateral epicondylar region and the common extensor muscle. extension but full range of motion of the elbow Stable to varus, valgus stress. Negative moving valgus stress test.  Ulnar nerve does not sublux. Negative cubital tunnel Tinel's. Contralateral elbow unremarkable  Musculoskeletal ultrasound was performed and interpreted by Charlann Boxer D.O.   Elbow: Left Lateral epicondyle and common extensor tendon origin visualized.  Patient continues to have a small avulsion but has had some healing noted. Radial head  unremarkable and located in annular ligament Medial epicondyle and common flexor tendon origin visualized.  No edema, effusions, or avulsions seen. Ulnar nerve in  cubital tunnel unremarkable. Olecranon and triceps insertion visualized and unremarkable without edema, effusion, or avulsion.  No signs olecranon bursitis. Power doppler signal normal.  IMPRESSION:   45% tear still of the extensor common tendon.   MSK US performed of: Left knee This study was ordered, performed, and interpreted by Charlann Boxer D.O.  Knee: All structures visualized. New anterior medial meniscal tear noted with no significant displacement but severe increase in Doppler flow. Patellar Tendon unremarkable on long and transverse views without effusion. No abnormality of prepatellar bursa. MCL has a very small tear distally on the articular side of her proximal he 5-10%.  IMPRESSION:  New medial meniscal tear with partial tear of MCL grade 1  Procedure note After informed written and verbal consent, patient was seated on exam table. Left knee was prepped with alcohol swab and utilizing anterolateral approach, patient's left knee space was injected with 4:1  marcaine 0.5%: Kenalog 40mg /dL. Patient tolerated the procedure well without immediate complications.   Impression and Recommendations:     This case required medical decision making of moderate complexity.

## 2014-12-11 NOTE — Patient Instructions (Addendum)
Great to see you and welcome back Start new exercises for the elbow PT will call you or call us in 48 hours If no better by end of furniture market I will get you in with [facility] or graves

## 2014-12-11 NOTE — Progress Notes (Signed)
Pre visit review using our clinic review tool, if applicable. No additional management support is needed unless otherwise documented below in the visit note. 

## 2014-12-11 NOTE — Assessment & Plan Note (Signed)
Patient is in a new acute tear of the anterior medial horn. This is different than previous exam. Patient was given an injection today with good resolution of pain. Am hoping that this will be beneficial. If he does not respond or the injection wears off too quickly and continues to have instability I do think patient should be referred for possible surgical intervention. Patient does have mild osteophytic changes and he would be a candidate for arthroscopic procedure.  Spent  25 minutes with patient face-to-face and had greater than 50% of counseling including as described above in assessment and plan.

## 2014-12-11 NOTE — Assessment & Plan Note (Signed)
Continues to give patient discomfort. This will be a long process. We discussed again about advance imaging which cannot be done secondary to patient's pacemaker. Patient will continue with the conservative therapy and go to formal physical therapy. This was prescribed today. We discussed continuing the icing regimen. Patient will come back and see me again in 4 weeks for further evaluation and treatment.

## 2014-12-13 ENCOUNTER — Encounter: Payer: Self-pay | Admitting: Family Medicine

## 2014-12-14 ENCOUNTER — Encounter: Payer: Self-pay | Admitting: Family Medicine

## 2014-12-16 ENCOUNTER — Encounter: Payer: Self-pay | Admitting: Family Medicine

## 2014-12-19 MED ORDER — TRAMADOL HCL 50 MG PO TABS: 50.0000 mg | ORAL_TABLET | Freq: Two times a day (BID) | ORAL | Status: DC | PRN

## 2014-12-21 ENCOUNTER — Encounter: Payer: Self-pay | Admitting: Physical Therapy

## 2014-12-21 ENCOUNTER — Ambulatory Visit: Payer: Medicare Other | Attending: Internal Medicine | Admitting: Physical Therapy

## 2014-12-21 DIAGNOSIS — M25562 Pain in left knee: Secondary | ICD-10-CM | POA: Diagnosis not present

## 2014-12-21 DIAGNOSIS — M25522 Pain in left elbow: Secondary | ICD-10-CM | POA: Diagnosis not present

## 2014-12-21 DIAGNOSIS — R262 Difficulty in walking, not elsewhere classified: Secondary | ICD-10-CM | POA: Diagnosis not present
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
## 2014-12-25 ENCOUNTER — Ambulatory Visit: Payer: Medicare Other | Admitting: Podiatry

## 2014-12-27 ENCOUNTER — Ambulatory Visit: Payer: Medicare Other | Attending: Internal Medicine | Admitting: Physical Therapy

## 2014-12-27 ENCOUNTER — Encounter: Payer: Self-pay | Admitting: Physical Therapy

## 2014-12-27 DIAGNOSIS — M25522 Pain in left elbow: Secondary | ICD-10-CM | POA: Insufficient documentation

## 2014-12-27 DIAGNOSIS — M25562 Pain in left knee: Secondary | ICD-10-CM | POA: Diagnosis not present

## 2014-12-27 DIAGNOSIS — R262 Difficulty in walking, not elsewhere classified: Secondary | ICD-10-CM | POA: Insufficient documentation
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
## 2015-01-04 ENCOUNTER — Ambulatory Visit: Payer: Medicare Other | Admitting: Physical Therapy

## 2015-01-04 ENCOUNTER — Encounter: Payer: Self-pay | Admitting: Physical Therapy

## 2015-01-04 DIAGNOSIS — M25522 Pain in left elbow: Secondary | ICD-10-CM | POA: Diagnosis not present

## 2015-01-04 DIAGNOSIS — R262 Difficulty in walking, not elsewhere classified: Secondary | ICD-10-CM

## 2015-01-04 DIAGNOSIS — M25562 Pain in left knee: Secondary | ICD-10-CM | POA: Diagnosis not present
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
## 2015-01-05 ENCOUNTER — Encounter: Payer: Self-pay | Admitting: Podiatry

## 2015-01-05 ENCOUNTER — Ambulatory Visit (INDEPENDENT_AMBULATORY_CARE_PROVIDER_SITE_OTHER): Payer: Medicare Other | Admitting: Podiatry

## 2015-01-05 DIAGNOSIS — B351 Tinea unguium: Secondary | ICD-10-CM

## 2015-01-07 NOTE — Progress Notes (Signed)
Subjective:     Patient ID: Patrick [name], male   DOB: 02-13-1941, 74 y.o.   MRN: [id number]  HPI patient presents for laser #3 stating they are improved but still present and that the Diflucan did raise his bleeding levels   Review of Systems     Objective:   Physical Exam Neurovascular status intact with yellow brittle nailbeds 1-5 both feet that have improved but are still present    Assessment:     Mycotic infection that's improved with laser still present    Plan:     Laser administered today approximate 1500 pulses and advised that we may need to still utilize medicine in the future. Reappoint as needed to reevaluate

## 2015-01-08 ENCOUNTER — Ambulatory Visit (INDEPENDENT_AMBULATORY_CARE_PROVIDER_SITE_OTHER): Payer: Medicare Other | Admitting: *Deleted

## 2015-01-08 DIAGNOSIS — I359 Nonrheumatic aortic valve disorder, unspecified: Secondary | ICD-10-CM | POA: Diagnosis not present

## 2015-01-08 DIAGNOSIS — I712 Thoracic aortic aneurysm, without rupture, unspecified: Secondary | ICD-10-CM

## 2015-01-08 DIAGNOSIS — L812 Freckles: Secondary | ICD-10-CM | POA: Diagnosis not present

## 2015-01-08 DIAGNOSIS — Z7901 Long term (current) use of anticoagulants: Secondary | ICD-10-CM

## 2015-01-08 DIAGNOSIS — L218 Other seborrheic dermatitis: Secondary | ICD-10-CM | POA: Diagnosis not present

## 2015-01-08 DIAGNOSIS — Z5181 Encounter for therapeutic drug level monitoring: Secondary | ICD-10-CM

## 2015-01-08 DIAGNOSIS — D225 Melanocytic nevi of trunk: Secondary | ICD-10-CM | POA: Diagnosis not present

## 2015-01-08 DIAGNOSIS — Z85828 Personal history of other malignant neoplasm of skin: Secondary | ICD-10-CM | POA: Diagnosis not present

## 2015-01-08 DIAGNOSIS — L821 Other seborrheic keratosis: Secondary | ICD-10-CM | POA: Diagnosis not present

## 2015-01-08 DIAGNOSIS — L570 Actinic keratosis: Secondary | ICD-10-CM | POA: Diagnosis not present

## 2015-01-08 LAB — POCT INR: INR: 2.2

## 2015-01-10 ENCOUNTER — Encounter: Payer: Self-pay | Admitting: Physical Therapy

## 2015-01-10 ENCOUNTER — Ambulatory Visit: Payer: Medicare Other | Admitting: Physical Therapy

## 2015-01-10 DIAGNOSIS — M25522 Pain in left elbow: Secondary | ICD-10-CM

## 2015-01-10 DIAGNOSIS — M25562 Pain in left knee: Secondary | ICD-10-CM | POA: Diagnosis not present

## 2015-01-10 DIAGNOSIS — R262 Difficulty in walking, not elsewhere classified: Secondary | ICD-10-CM
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
## 2015-01-12 ENCOUNTER — Other Ambulatory Visit (INDEPENDENT_AMBULATORY_CARE_PROVIDER_SITE_OTHER): Payer: Medicare Other

## 2015-01-12 ENCOUNTER — Encounter: Payer: Self-pay | Admitting: Family Medicine

## 2015-01-12 ENCOUNTER — Telehealth: Payer: Self-pay | Admitting: Internal Medicine

## 2015-01-12 ENCOUNTER — Ambulatory Visit (INDEPENDENT_AMBULATORY_CARE_PROVIDER_SITE_OTHER): Payer: Medicare Other | Admitting: Family Medicine

## 2015-01-12 VITALS — BP 128/82 | HR 56 | Ht 72.0 in | Wt 175.0 lb

## 2015-01-12 DIAGNOSIS — M25562 Pain in left knee: Secondary | ICD-10-CM

## 2015-01-12 DIAGNOSIS — S42432A Displaced fracture (avulsion) of lateral epicondyle of left humerus, initial encounter for closed fracture: Secondary | ICD-10-CM | POA: Diagnosis not present

## 2015-01-12 DIAGNOSIS — E785 Hyperlipidemia, unspecified: Secondary | ICD-10-CM

## 2015-01-12 DIAGNOSIS — N320 Bladder-neck obstruction: Secondary | ICD-10-CM

## 2015-01-12 DIAGNOSIS — M25362 Other instability, left knee: Secondary | ICD-10-CM

## 2015-01-12 DIAGNOSIS — M2352 Chronic instability of knee, left knee: Secondary | ICD-10-CM

## 2015-01-12 NOTE — Assessment & Plan Note (Signed)
Seems to be healed at this moment. Mild positive McMurray still on exam. Patient does have some mild underlying arthritic changes that are likely also contribute. I do not feel that advance imaging is necessary. Encourage him to start increasing activity. We discussed possible bracing of the knee which patient declined. Patient come back in 3 weeks for further evaluation. Likely at that time we will release him.

## 2015-01-12 NOTE — Progress Notes (Signed)
Pre visit review using our clinic review tool, if applicable. No additional management support is needed unless otherwise documented below in the visit note. 

## 2015-01-12 NOTE — Progress Notes (Signed)
[name] [place], [place] [address] Phone: [phone] Subjective:     CC: Left elbow pain follow up  [id number] Patrick [name] is a 74 y.o. male coming in with complaint of left elbow pain. Patient was found to have a full-thickness tear of the  extensor common tendon. Patient has been doing very well. States that there isn't any significant pain but continues to have weakness. Able to do all daily activities. Did well with formal physical therapy. No new symptoms.   Patient is still complaining of left knee pain. Patient was found to have a grade 1 tear of the MCL as well as a meniscal tear. Patient elected try an injection as well as ago to formal physical therapy. Patient states the knee pain is significantly less at night. Patient states that during the day some mild pain especially when he stresses the inside of his knee. Otherwise no significant pain that stops him from activity. Patient was to start golfing that is concerning. It is finishing up with physical therapy at this time.     Past medical history, social, surgical and family history all reviewed in electronic medical record.   Review of Systems: No headache, visual changes, nausea, vomiting, diarrhea, constipation, dizziness, abdominal pain, skin rash, fevers, chills, night sweats, weight loss, swollen lymph nodes, body aches, joint swelling, muscle aches, chest pain, shortness of breath, mood changes.   Objective Blood pressure 128/82, pulse 56, height 6' (1.829 m), weight 175 lb (79.379 kg), SpO2 97 %.  General: No apparent distress alert and oriented x3 mood and affect normal, dressed appropriately.  HEENT: Pupils equal, extraocular movements intact  Respiratory: Patient's speak in full sentences and does not appear short of breath  Cardiovascular: No lower extremity edema, non tender, no erythema  Skin: Warm dry intact with no signs of infection or rash on extremities  or on axial skeleton.  Abdomen: Soft nontender  Neuro: Cranial nerves II through XII are intact, neurovascularly intact in all extremities with 2+ DTRs and 2+ pulses.  Lymph: No lymphadenopathy of posterior or anterior cervical chain or axillae bilaterally.  Gait normal with good balance and coordination.  MSK:  Non tender with full range of motion and good stability and symmetric strength and tone of shoulders,  wrist, hip, and ankles bilaterally.   Knee: Left Normal to inspection with no erythema or effusion or obvious bony abnormalities. Moderate tenderness to palpation over the medial joint line ROM full in flexion and extension and lower leg rotation. Ligaments with solid consistent endpoints including ACL, PCL, LCL, MCL. Mild positive Mcmurray's, Apley's, and Thessalonian tests. Non painful patellar compression. Patellar glide with mild crepitus. Patellar and quadriceps tendons unremarkable. Hamstring and quadriceps strength is normal.    Elbow: Left Mild abnormality noted visually compared to the contralateral side but no significant swelling around common extensor mild discomfort with extension of the wrist first significant amount of force Stable to varus, valgus stress. Negative moving valgus stress test.  Ulnar nerve does not sublux. Negative cubital tunnel Tinel's. Contralateral elbow unremarkable  Musculoskeletal ultrasound was performed and interpreted by Charlann Boxer D.O.   Elbow: Left Lateral epicondyle and common extensor tendon origin visualized. Patient does have what appears to be callus formation noted. Patient's avulsion fracture that was seen still seems to be there at all. No significant healing. Tear of the tendon is significantly improved so. Radial head unremarkable and located in annular ligament Medial epicondyle and common flexor  tendon origin visualized.  No edema, effusions, or avulsions seen. Ulnar nerve in cubital tunnel unremarkable. Olecranon and  triceps insertion visualized and unremarkable without edema, effusion, or avulsion.  No signs olecranon bursitis. Power doppler signal normal.  IMPRESSION:   25% tear still of the extensor common tendon continues to have the small avulsion   MSK US performed of: Left knee This study was ordered, performed, and interpreted by Charlann Boxer D.O.  Knee: All structures visualized. New anterior medial meniscal tear noted with no significant displacement but severe increase in Doppler flow. Patellar Tendon unremarkable on long and transverse views without effusion. No abnormality of prepatellar bursa. MCL has a very small tear distally on the articular side of her proximal he 5-10%.  IMPRESSION:  Meniscus well healed at this time and patient's MCL healed as well.    Impression and Recommendations:     This case required medical decision making of moderate complexity.

## 2015-01-12 NOTE — Assessment & Plan Note (Signed)
Continues to improve. Patient continues to have the avulsion. Patient did have a full-thickness tear and it is impressive how much patient has improve with conservative therapy at this time. We discussed with him though that I do not know if he will have full strength. As long as he does not have any significant discomfort at the cubital be well. Patient will start increasing his activity and get back to golfing but avoid any significant strain on this wrist if possible. Patient then will come back and see me again in 3 weeks after increasing and we'll make sure he is continuing to do well. If not I would consider advanced imaging. She would've completed formal physical therapy by follow-up in 3 weeks.
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
## 2015-01-12 NOTE — Telephone Encounter (Signed)
Pt is needing blood work for PSA, cholesterol, blood profile

## 2015-01-15 NOTE — Telephone Encounter (Signed)
Ok Thx 

## 2015-01-16 NOTE — Telephone Encounter (Signed)
Pt informed

## 2015-01-30 ENCOUNTER — Telehealth: Payer: Self-pay

## 2015-01-30 NOTE — Telephone Encounter (Signed)
Call to the patient to discuss AWV; He has labs to be drawn and hoping we can coordinate visit with lab draw as well as fup with [name], as he has not seen since 2015. Left direct line for call back

## 2015-01-31 ENCOUNTER — Other Ambulatory Visit: Payer: Self-pay | Admitting: *Deleted

## 2015-01-31 ENCOUNTER — Telehealth: Payer: Self-pay | Admitting: Pharmacist

## 2015-01-31 ENCOUNTER — Other Ambulatory Visit (INDEPENDENT_AMBULATORY_CARE_PROVIDER_SITE_OTHER): Payer: Medicare Other

## 2015-01-31 DIAGNOSIS — N320 Bladder-neck obstruction: Secondary | ICD-10-CM

## 2015-01-31 DIAGNOSIS — E785 Hyperlipidemia, unspecified: Secondary | ICD-10-CM | POA: Diagnosis not present

## 2015-01-31 LAB — CBC WITH DIFFERENTIAL/PLATELET
BASOS ABS: 0 10*3/uL (ref 0.0–0.1)
BASOS PCT: 0.3 % (ref 0.0–3.0)
Eosinophils Absolute: 0.1 10*3/uL (ref 0.0–0.7)
Eosinophils Relative: 1.7 % (ref 0.0–5.0)
HCT: 47.9 % (ref 39.0–52.0)
Hemoglobin: 16.3 g/dL (ref 13.0–17.0)
LYMPHS PCT: 18.9 % (ref 12.0–46.0)
Lymphs Abs: 1.1 10*3/uL (ref 0.7–4.0)
MCHC: 34 g/dL (ref 30.0–36.0)
MCV: 96 fl (ref 78.0–100.0)
MONO ABS: 0.6 10*3/uL (ref 0.1–1.0)
Monocytes Relative: 10.6 % (ref 3.0–12.0)
NEUTROS PCT: 68.5 % (ref 43.0–77.0)
Neutro Abs: 4.1 10*3/uL (ref 1.4–7.7)
PLATELETS: 172 10*3/uL (ref 150.0–400.0)
RBC: 4.99 Mil/uL (ref 4.22–5.81)
RDW: 13 % (ref 11.5–15.5)
WBC: 6 10*3/uL (ref 4.0–10.5)

## 2015-01-31 LAB — LIPID PANEL
CHOL/HDL RATIO: 4
Cholesterol: 178 mg/dL (ref 0–200)
HDL: 47.3 mg/dL (ref >39.00)
LDL Cholesterol: 100 mg/dL — ABNORMAL HIGH (ref 0–99)
NONHDL: 130.28
TRIGLYCERIDES: 153 mg/dL — AB (ref 0.0–149.0)
VLDL: 30.6 mg/dL (ref 0.0–40.0)

## 2015-01-31 LAB — HEPATIC FUNCTION PANEL
ALBUMIN: 4.1 g/dL (ref 3.5–5.2)
ALT: 17 U/L (ref 0–53)
AST: 19 U/L (ref 0–37)
Alkaline Phosphatase: 67 U/L (ref 39–117)
Bilirubin, Direct: 0.1 mg/dL (ref 0.0–0.3)
TOTAL PROTEIN: 6 g/dL (ref 6.0–8.3)
Total Bilirubin: 0.6 mg/dL (ref 0.2–1.2)

## 2015-01-31 LAB — PSA: PSA: 10.52 ng/mL — AB (ref 0.10–4.00)

## 2015-01-31 LAB — BASIC METABOLIC PANEL
BUN: 15 mg/dL (ref 6–23)
CHLORIDE: 106 meq/L (ref 96–112)
CO2: 32 mEq/L (ref 19–32)
CREATININE: 1.15 mg/dL (ref 0.40–1.50)
Calcium: 10 mg/dL (ref 8.4–10.5)
GFR: 66.34 mL/min (ref >60.00)
Glucose, Bld: 84 mg/dL (ref 70–99)
Potassium: 5.7 mEq/L — ABNORMAL HIGH (ref 3.5–5.1)
Sodium: 142 mEq/L (ref 135–145)

## 2015-01-31 LAB — TSH: TSH: 0.61 u[IU]/mL (ref 0.35–4.50)

## 2015-01-31 MED ORDER — WARFARIN SODIUM 7.5 MG PO TABS: ORAL_TABLET | ORAL | Status: DC

## 2015-01-31 NOTE — Telephone Encounter (Signed)
ENw Message  Pt requested to speak w/ coumadin clinic about his warfarin rx. Please call back and discuss.

## 2015-01-31 NOTE — Telephone Encounter (Signed)
Called pt back & he requested his Coumadin refill, sent as requested. Please refer to refill encounter.

## 2015-02-01 NOTE — Telephone Encounter (Signed)
Follow up with Patrick [name] regarding AWV; Noted that he has not seen [name] since 2015; has seen [name]; Summaries from [facility] and [place];  The patient needs to schedule annual with [name] to review hx; meds and refills or other new issues.

## 2015-02-07 NOTE — Telephone Encounter (Signed)
This patient called back for AWV; stated he completed labs and did not know the results; PSA was elevated (did not state result) but made 30 minute apt for CPE next Wed (12/21)  at 8:15;  K  Also elevated  Needs AWV; but you can defer to schedule at a later time this coming year.   Tks sh

## 2015-02-08 ENCOUNTER — Ambulatory Visit (INDEPENDENT_AMBULATORY_CARE_PROVIDER_SITE_OTHER): Payer: Medicare Other | Admitting: *Deleted

## 2015-02-08 DIAGNOSIS — I712 Thoracic aortic aneurysm, without rupture, unspecified: Secondary | ICD-10-CM

## 2015-02-08 DIAGNOSIS — I359 Nonrheumatic aortic valve disorder, unspecified: Secondary | ICD-10-CM

## 2015-02-08 DIAGNOSIS — Z5181 Encounter for therapeutic drug level monitoring: Secondary | ICD-10-CM | POA: Diagnosis not present

## 2015-02-08 LAB — POCT INR: INR: 2.2

## 2015-02-08 MED ORDER — WARFARIN SODIUM 7.5 MG PO TABS: ORAL_TABLET | ORAL | Status: DC

## 2015-02-08 NOTE — Telephone Encounter (Signed)
Thx

## 2015-02-12 DIAGNOSIS — Z85828 Personal history of other malignant neoplasm of skin: Secondary | ICD-10-CM | POA: Diagnosis not present

## 2015-02-12 DIAGNOSIS — L300 Nummular dermatitis: Secondary | ICD-10-CM | POA: Diagnosis not present

## 2015-02-12 DIAGNOSIS — D234 Other benign neoplasm of skin of scalp and neck: Secondary | ICD-10-CM | POA: Diagnosis not present

## 2015-02-12 DIAGNOSIS — D485 Neoplasm of uncertain behavior of skin: Secondary | ICD-10-CM | POA: Diagnosis not present

## 2015-02-12 DIAGNOSIS — D239 Other benign neoplasm of skin, unspecified: Secondary | ICD-10-CM | POA: Diagnosis not present

## 2015-02-12 DIAGNOSIS — L570 Actinic keratosis: Secondary | ICD-10-CM | POA: Diagnosis not present

## 2015-02-14 ENCOUNTER — Telehealth: Payer: Self-pay

## 2015-02-14 ENCOUNTER — Ambulatory Visit (INDEPENDENT_AMBULATORY_CARE_PROVIDER_SITE_OTHER): Payer: Medicare Other | Admitting: Internal Medicine

## 2015-02-14 ENCOUNTER — Encounter: Payer: Self-pay | Admitting: Internal Medicine

## 2015-02-14 VITALS — BP 140/90 | HR 63 | Ht 72.0 in | Wt 177.0 lb

## 2015-02-14 DIAGNOSIS — N400 Enlarged prostate without lower urinary tract symptoms: Secondary | ICD-10-CM

## 2015-02-14 DIAGNOSIS — Z0000 Encounter for general adult medical examination without abnormal findings: Secondary | ICD-10-CM | POA: Diagnosis not present

## 2015-02-14 DIAGNOSIS — R972 Elevated prostate specific antigen [PSA]: Secondary | ICD-10-CM | POA: Diagnosis not present

## 2015-02-14 DIAGNOSIS — Z23 Encounter for immunization: Secondary | ICD-10-CM

## 2015-02-14 DIAGNOSIS — J069 Acute upper respiratory infection, unspecified: Secondary | ICD-10-CM

## 2015-02-14 DIAGNOSIS — I10 Essential (primary) hypertension: Secondary | ICD-10-CM | POA: Diagnosis not present

## 2015-02-14 MED ORDER — CIPROFLOXACIN HCL 500 MG PO TABS: 500.0000 mg | ORAL_TABLET | Freq: Two times a day (BID) | ORAL | Status: DC

## 2015-02-14 MED ORDER — LEVOFLOXACIN 500 MG PO TABS: 500.0000 mg | ORAL_TABLET | Freq: Every day | ORAL | Status: DC

## 2015-02-14 MED ORDER — AMOXICILLIN 500 MG PO CAPS: 500.0000 mg | ORAL_CAPSULE | Freq: Four times a day (QID) | ORAL | Status: DC

## 2015-02-14 NOTE — Assessment & Plan Note (Signed)
Dr Risa Grill Levaquin qd Free PSA in 2-3 weeks

## 2015-02-14 NOTE — Assessment & Plan Note (Signed)
On Amlodipine 

## 2015-02-14 NOTE — Patient Instructions (Addendum)
Patrick [name] , Thank you for taking time to come for your Medicare Wellness Visit. I appreciate your ongoing commitment to your health goals. Please review the following plan we discussed and let me know if I can assist you in the future.   Will schedule nurse visit to take Prevnar 13 when you get labs redrawn  I am sending a copy of the [facility] Advanced Directive; Please call for questions.  Added:  See the website for the [employer] for more information regarding Triglycerides.  [employer].es There is an A- Z index with health information;    These are the goals we discussed: Goals    . patient      Will move to [facility]; enjoy the sun and fish       This is a list of the screening recommended for you and due dates:  Health Maintenance  Topic Date Due  . Pneumonia vaccines (2 of 2 - PCV13) 11/26/2013  . Flu Shot  09/25/2014  . Tetanus Vaccine  09/19/2019  . Colon Cancer Screening  10/16/2019  . Shingles Vaccine  Completed     Health Maintenance, Male A healthy lifestyle and preventative care can promote health and wellness.  Maintain regular health, dental, and eye exams.  Eat a healthy diet. Foods like vegetables, fruits, whole grains, low-fat dairy products, and lean protein foods contain the nutrients you need and are low in calories. Decrease your intake of foods high in solid fats, added sugars, and salt. Get information about a proper diet from your health care provider, if necessary.  Regular physical exercise is one of the most important things you can do for your health. Most adults should get at least 150 minutes of moderate-intensity exercise (any activity that increases your heart rate and causes you to sweat) each week. In addition, most adults need muscle-strengthening exercises on 2 or more days a week.   Maintain a healthy weight. The body mass index (BMI) is a screening tool to identify possible weight problems. It  provides an estimate of body fat based on height and weight. Your health care provider can find your BMI and can help you achieve or maintain a healthy weight. For males 20 years and older:  A BMI below 18.5 is considered underweight.  A BMI of 18.5 to 24.9 is normal.  A BMI of 25 to 29.9 is considered overweight.  A BMI of 30 and above is considered obese.  Maintain normal blood lipids and cholesterol by exercising and minimizing your intake of saturated fat. Eat a balanced diet with plenty of fruits and vegetables. Blood tests for lipids and cholesterol should begin at age 84 and be repeated every 5 years. If your lipid or cholesterol levels are high, you are over age 79, or you are at high risk for heart disease, you may need your cholesterol levels checked more frequently.Ongoing high lipid and cholesterol levels should be treated with medicines if diet and exercise are not working.  If you smoke, find out from your health care provider how to quit. If you do not use tobacco, do not start.  Lung cancer screening is recommended for adults aged 2-80 years who are at high risk for developing lung cancer because of a history of smoking. A yearly low-dose CT scan of the lungs is recommended for people who have at least a 30-pack-year history of smoking and are current smokers or have quit within the past 15 years. A pack year of smoking  is smoking an average of 1 pack of cigarettes a day for 1 year (for example, a 30-pack-year history of smoking could mean smoking 1 pack a day for 30 years or 2 packs a day for 15 years). Yearly screening should continue until the smoker has stopped smoking for at least 15 years. Yearly screening should be stopped for people who develop a health problem that would prevent them from having lung cancer treatment.  If you choose to drink alcohol, do not have more than 2 drinks per day. One drink is considered to be 12 oz (360 mL) of beer, 5 oz (150 mL) of wine, or 1.5  oz (45 mL) of liquor.  Avoid the use of street drugs. Do not share needles with anyone. Ask for help if you need support or instructions about stopping the use of drugs.  High blood pressure causes heart disease and increases the risk of stroke. High blood pressure is more likely to develop in:  People who have blood pressure in the end of the normal range (100-139/85-89 mm Hg).  People who are overweight or obese.  People who are African American.  If you are 16-58 years of age, have your blood pressure checked every 3-5 years. If you are 77 years of age or older, have your blood pressure checked every year. You should have your blood pressure measured twice--once when you are at a hospital or clinic, and once when you are not at a hospital or clinic. Record the average of the two measurements. To check your blood pressure when you are not at a hospital or clinic, you can use:  An automated blood pressure machine at a pharmacy.  A home blood pressure monitor.  If you are 18-52 years old, ask your health care provider if you should take aspirin to prevent heart disease.  Diabetes screening involves taking a blood sample to check your fasting blood sugar level. This should be done once every 3 years after age 48 if you are at a normal weight and without risk factors for diabetes. Testing should be considered at a younger age or be carried out more frequently if you are overweight and have at least 1 risk factor for diabetes.  Colorectal cancer can be detected and often prevented. Most routine colorectal cancer screening begins at the age of 38 and continues through age 15. However, your health care provider may recommend screening at an earlier age if you have risk factors for colon cancer. On a yearly basis, your health care provider may provide home test kits to check for hidden blood in the stool. A small camera at the end of a tube may be used to directly examine the colon (sigmoidoscopy or  colonoscopy) to detect the earliest forms of colorectal cancer. Talk to your health care provider about this at age 72 when routine screening begins. A direct exam of the colon should be repeated every 5-10 years through age 1, unless early forms of precancerous polyps or small growths are found.  People who are at an increased risk for hepatitis B should be screened for this virus. You are considered at high risk for hepatitis B if:  You were born in a country where hepatitis B occurs often. Talk with your health care provider about which countries are considered high risk.  Your parents were born in a high-risk country and you have not received a shot to protect against hepatitis B (hepatitis B vaccine).  You have HIV or AIDS.  You use needles to inject street drugs.  You live with, or have sex with, someone who has hepatitis B.  You are a man who has sex with other men (MSM).  You get hemodialysis treatment.  You take certain medicines for conditions like cancer, organ transplantation, and autoimmune conditions.  Hepatitis C blood testing is recommended for all people born from [birth year] through 1963 and any individual with known risk factors for hepatitis C.  Healthy men should no longer receive prostate-specific antigen (PSA) blood tests as part of routine cancer screening. Talk to your health care provider about prostate cancer screening.  Testicular cancer screening is not recommended for adolescents or adult males who have no symptoms. Screening includes self-exam, a health care provider exam, and other screening tests. Consult with your health care provider about any symptoms you have or any concerns you have about testicular cancer.  Practice safe sex. Use condoms and avoid high-risk sexual practices to reduce the spread of sexually transmitted infections (STIs).  You should be screened for STIs, including gonorrhea and chlamydia if:  You are sexually active and are younger than  24 years.  You are older than 24 years, and your health care provider tells you that you are at risk for this type of infection.  Your sexual activity has changed since you were last screened, and you are at an increased risk for chlamydia or gonorrhea. Ask your health care provider if you are at risk.  If you are at risk of being infected with HIV, it is recommended that you take a prescription medicine daily to prevent HIV infection. This is called pre-exposure prophylaxis (PrEP). You are considered at risk if:  You are a man who has sex with other men (MSM).  You are a heterosexual man who is sexually active with multiple partners.  You take drugs by injection.  You are sexually active with a partner who has HIV.  Talk with your health care provider about whether you are at high risk of being infected with HIV. If you choose to begin PrEP, you should first be tested for HIV. You should then be tested every 3 months for as long as you are taking PrEP.  Use sunscreen. Apply sunscreen liberally and repeatedly throughout the day. You should seek shade when your shadow is shorter than you. Protect yourself by wearing long sleeves, pants, a wide-brimmed hat, and sunglasses year round whenever you are outdoors.  Tell your health care provider of new moles or changes in moles, especially if there is a change in shape or color. Also, tell your health care provider if a mole is larger than the size of a pencil eraser.  A one-time screening for abdominal aortic aneurysm (AAA) and surgical repair of large AAAs by ultrasound is recommended for men aged 81-75 years who are current or former smokers.  Stay current with your vaccines (immunizations).   This information is not intended to replace advice given to you by your health care provider. Make sure you discuss any questions you have with your health care provider.   Document Released: 08/09/2007 Document Revised: 03/03/2014 Document Reviewed:  07/08/2010 Elsevier Interactive Patient Education [employer].

## 2015-02-14 NOTE — Progress Notes (Signed)
Pre visit review using our clinic review tool, if applicable. No additional management support is needed unless otherwise documented below in the visit note. 

## 2015-02-14 NOTE — Progress Notes (Signed)
Subjective:  Patient ID: Patrick [name], male    DOB: 20-Oct-1940  Age: 74 y.o. MRN: [id number]  CC: No chief complaint on file.   HPI [name] presents for a well exam.  Outpatient Prescriptions Prior to Visit  Medication Sig Dispense Refill  . amLODipine (NORVASC) 5 MG tablet Take 0.5 tablets (2.5 mg total) by mouth daily. 30 tablet 5  . atorvastatin (LIPITOR) 10 MG tablet Take 0.5 tablets (5 mg total) by mouth every other day. 30 tablet 11  . cyclobenzaprine (FLEXERIL) 10 MG tablet Take 1 tablet (10 mg total) by mouth at bedtime. 20 tablet 0  . desonide (DESOWEN) 0.05 % cream Apply 1 application topically daily as needed. 60 g 3  . STAXYN 10 MG TBDP Take 10 mg by mouth every morning.  0  . SUMAtriptan (IMITREX) 50 MG tablet take 1/2 tablet by mouth every 2 hours if needed as directed 9 tablet 3  . tadalafil (CIALIS) 5 MG tablet Take 1 tablet (5 mg total) by mouth daily. 90 tablet 3  . warfarin (COUMADIN) 7.5 MG tablet Take 1 tablet by mouth daily or as directed 100 tablet 1  . amoxicillin (AMOXIL) 500 MG capsule take 1 capsule by mouth four times a day 20 capsule 3  . traMADol (ULTRAM) 50 MG tablet Take 1 tablet (50 mg total) by mouth every 12 (twelve) hours as needed. (Patient not taking: Reported on 02/14/2015) 60 tablet 3  . ciprofloxacin (CIPRO) 500 MG tablet Take 1 tablet (500 mg total) by mouth 2 (two) times daily. (Patient not taking: Reported on 02/14/2015) 20 tablet 2   No facility-administered medications prior to visit.    ROS Review of Systems  Constitutional: Negative for appetite change, fatigue and unexpected weight change.  HENT: Negative for congestion, nosebleeds, sneezing, sore throat and trouble swallowing.   Eyes: Negative for itching and visual disturbance.  Respiratory: Positive for shortness of breath. Negative for cough.   Cardiovascular: Negative for chest pain, palpitations and leg swelling.  Gastrointestinal: Negative for nausea, diarrhea, blood  in stool and abdominal distention.  Genitourinary: Negative for frequency and hematuria.  Musculoskeletal: Negative for back pain, joint swelling, gait problem and neck pain.  Skin: Negative for rash.  Neurological: Negative for dizziness, tremors, speech difficulty and weakness.  Psychiatric/Behavioral: Negative for sleep disturbance, dysphoric mood and agitation. The patient is nervous/anxious.   prostate NE (per urology pending)  Objective:  BP 140/90 mmHg  Pulse 63  Ht 6' (1.829 m)  Wt 177 lb (80.287 kg)  BMI 24.00 kg/m2  SpO2 96%  BP Readings from Last 3 Encounters:  02/14/15 140/90  01/12/15 128/82  12/11/14 134/80    Wt Readings from Last 3 Encounters:  02/14/15 177 lb (80.287 kg)  01/12/15 175 lb (79.379 kg)  12/11/14 175 lb (79.379 kg)    Physical Exam  Constitutional: He is oriented to person, place, and time. He appears well-developed. No distress.  NAD  HENT:  Mouth/Throat: Oropharynx is clear and moist.  Eyes: Conjunctivae are normal. Pupils are equal, round, and reactive to light.  Neck: Normal range of motion. No JVD present. No thyromegaly present.  Cardiovascular: Normal rate, regular rhythm, normal heart sounds and intact distal pulses.  Exam reveals no gallop and no friction rub.   No murmur heard. Pulmonary/Chest: Effort normal and breath sounds normal. No respiratory distress. He has no wheezes. He has no rales. He exhibits no tenderness.  Abdominal: Soft. Bowel sounds are normal. He exhibits no  distension and no mass. There is no tenderness. There is no rebound and no guarding.  Musculoskeletal: Normal range of motion. He exhibits no edema or tenderness.  Lymphadenopathy:    He has no cervical adenopathy.  Neurological: He is alert and oriented to person, place, and time. He has normal reflexes. No cranial nerve deficit. He exhibits normal muscle tone. He displays a negative Romberg sign. Coordination and gait normal.  Skin: Skin is warm and dry. No  rash noted.  Psychiatric: He has a normal mood and affect. His behavior is normal. Judgment and thought content normal.  mech heart valve click   Lab Results  Component Value Date   WBC 6.0 01/31/2015   HGB 16.3 01/31/2015   HCT 47.9 01/31/2015   PLT 172.0 01/31/2015   GLUCOSE 84 01/31/2015   CHOL 178 01/31/2015   TRIG 153.0* 01/31/2015   HDL 47.30 01/31/2015   LDLCALC 100* 01/31/2015   ALT 17 01/31/2015   AST 19 01/31/2015   NA 142 01/31/2015   K 5.7* 01/31/2015   CL 106 01/31/2015   CREATININE 1.15 01/31/2015   BUN 15 01/31/2015   CO2 32 01/31/2015   TSH 0.61 01/31/2015   PSA 10.52* 01/31/2015   INR 2.2 02/08/2015   HGBA1C 5.4 03/05/2009    [place] Extrem Low Left Ltd  11/03/2014  MSK US performed of: Left This study was ordered, performed, and interpreted by Charlann Boxer D.O. Knee: All structures visualized. Chronic degenerative changes of the medial meniscus noted with continued to space been but decreased hypoechoic changes Patellar Tendon unremarkable on long and transverse views without effusion. No abnormality of prepatellar bursa. LCL and MCL unremarkable on long and transverse views. No abnormality of origin of medial or lateral head of the gastrocnemius. 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 EXAM: LEFT KNEE - COMPLETE 4+ VIEW COMPARISON:  None in PACs FINDINGS: The bones are adequately mineralized. There is minimal narrowing of the medial joint compartment. There is beaking of the lateral tibial spine. There is minimal calcification of the lateral meniscus. There is also mild calcification of the articular cartilage of the patella. There is a spur from the superior articular margin of the patella. The proximal fibula is intact. There is no significant joint effusion. 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 There is no acute bony abnormality. Electronically Signed   By: David  Martinique M.D.   On: 11/02/2014 12:43    Assessment & Plan:   Diagnoses and all orders for this visit:  Well adult exam  Essential hypertension  Acute upper respiratory infection  BPH (benign prostatic hyperplasia) -     PSA, total and free; Future  Routine history and physical examination of adult  Need for immunization against influenza -     Flu vaccine HIGH DOSE PF (Fluzone High dose)  Elevated PSA  Other orders -     ciprofloxacin (CIPRO) 500 MG tablet; Take 1 tablet (500 mg total) by mouth 2 (two) times daily. -     amoxicillin (AMOXIL) 500 MG capsule; Take 1 capsule (500 mg total) by mouth 4 (four) times daily. -     levofloxacin (LEVAQUIN) 500 MG tablet; Take 1 tablet (500 mg total) by mouth daily.   I have changed Mr. Grein's amoxicillin. I am also having him start on levofloxacin.  Additionally, I am having him maintain his desonide, amLODipine, cyclobenzaprine, SUMAtriptan, tadalafil, atorvastatin, STAXYN, traMADol, warfarin, and ciprofloxacin.  Meds ordered this encounter  Medications  . ciprofloxacin (CIPRO) 500 MG tablet    Sig: Take 1 tablet (500 mg total) by mouth 2 (two) times daily.    Dispense:  20 tablet    Refill:  2  . amoxicillin (AMOXIL) 500 MG capsule    Sig: Take 1 capsule (500 mg total) by mouth 4 (four) times daily.    Dispense:  20 capsule    Refill:  3  . levofloxacin (LEVAQUIN) 500 MG tablet    Sig: Take 1 tablet (500 mg total) by mouth daily.    Dispense:  10 tablet    Refill:  0     Follow-up: Return for a follow-up visit.  Walker Kehr, MD

## 2015-02-14 NOTE — Assessment & Plan Note (Signed)
12/16 Dr Risa Grill Levaquin x 10 d Repeat PSA/free PSA in 2-3 wks before appt w/Dr Risa Grill 03/05/15

## 2015-02-14 NOTE — Assessment & Plan Note (Signed)
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
## 2015-02-14 NOTE — Assessment & Plan Note (Signed)
Levaquin

## 2015-02-14 NOTE — Telephone Encounter (Signed)
Stated that I have a copy of cone's AD and will leave it in an envelope for him to pick up when he comes in for lab and agreed with plan Advanced Directive in envelope

## 2015-02-27 ENCOUNTER — Other Ambulatory Visit: Payer: Medicare Other

## 2015-02-27 DIAGNOSIS — N400 Enlarged prostate without lower urinary tract symptoms: Secondary | ICD-10-CM

## 2015-02-27 DIAGNOSIS — R972 Elevated prostate specific antigen [PSA]: Secondary | ICD-10-CM | POA: Diagnosis not present

## 2015-02-28 LAB — PSA, TOTAL AND FREE
PSA FREE PCT: 23 % — AB (ref >25)
PSA, Free: 1.81 ng/mL
PSA: 7.89 ng/mL — AB (ref <=4.00)

## 2015-03-01 ENCOUNTER — Encounter: Payer: Self-pay | Admitting: Internal Medicine

## 2015-03-05 DIAGNOSIS — Z0000 Encounter for general adult medical examination without abnormal findings: Secondary | ICD-10-CM | POA: Diagnosis not present

## 2015-03-05 DIAGNOSIS — R972 Elevated prostate specific antigen [PSA]: Secondary | ICD-10-CM | POA: Diagnosis not present

## 2015-03-05 DIAGNOSIS — R338 Other retention of urine: Secondary | ICD-10-CM | POA: Diagnosis not present

## 2015-03-05 DIAGNOSIS — N401 Enlarged prostate with lower urinary tract symptoms: Secondary | ICD-10-CM | POA: Diagnosis not present

## 2015-03-05 DIAGNOSIS — R3912 Poor urinary stream: Secondary | ICD-10-CM | POA: Diagnosis not present

## 2015-03-23 ENCOUNTER — Ambulatory Visit (INDEPENDENT_AMBULATORY_CARE_PROVIDER_SITE_OTHER): Payer: Medicare Other

## 2015-03-23 ENCOUNTER — Ambulatory Visit: Payer: Medicare Other | Admitting: Internal Medicine

## 2015-03-23 DIAGNOSIS — Z5181 Encounter for therapeutic drug level monitoring: Secondary | ICD-10-CM | POA: Diagnosis not present

## 2015-03-23 DIAGNOSIS — I712 Thoracic aortic aneurysm, without rupture, unspecified: Secondary | ICD-10-CM

## 2015-03-23 DIAGNOSIS — I359 Nonrheumatic aortic valve disorder, unspecified: Secondary | ICD-10-CM | POA: Diagnosis not present

## 2015-03-23 LAB — POCT INR: INR: 2

## 2015-04-16 ENCOUNTER — Other Ambulatory Visit: Payer: Self-pay | Admitting: Cardiovascular Disease

## 2015-04-16 ENCOUNTER — Telehealth: Payer: Self-pay | Admitting: *Deleted

## 2015-04-16 NOTE — Telephone Encounter (Signed)
Received clearance form from [employer]. Pt needs dental implant. They are asking if pt can come off coumadin and for how many days.  Pt stopped last fall when he had tooth removed.  Discussed with Fuller Canada, PharmD and need to know how many days dentist would like pt to hold.  I spoke with Linus Orn at dental office and she will check with dentist.  Linus Orn aware pt needs antibiotic prior to dental work

## 2015-04-16 NOTE — Telephone Encounter (Signed)
Rx request sent to pharmacy.  

## 2015-04-17 NOTE — Telephone Encounter (Signed)
Spoke with Olivia Mackie at Dr. Laurice Record office. Dr. Docia Barrier gave ok for pt to hold Coumadin for only 3 days prior to dental implant. Pt is ok to hold without Lovenox bridge. He has AVR but no other risk factors for stroke. Has held Coumadin x5 days last fall for procedure with no Lovenox bridge (see phone note 11/07/14). Dentist office will prescribe abx prior to dental work. Clearance form faxed back to dental office [phone] on 04/17/15.

## 2015-04-17 NOTE — Telephone Encounter (Signed)
Follow up      Pt needs to stay off coumadin for 5 days prior to dental implant

## 2015-04-26 ENCOUNTER — Telehealth: Payer: Self-pay | Admitting: *Deleted

## 2015-04-26 NOTE — Telephone Encounter (Signed)
Pt called for instructions in regards to restarting Coumadin, he held Monday, Tuesday and Wednesdays dose for oral surgery today. Thus after discussing with Dr Hedy Camara, Sherian Rein D he will just restart normal dose without any booster doses. He has F/U on 05/04/15.

## 2015-05-03 ENCOUNTER — Telehealth: Payer: Self-pay | Admitting: Internal Medicine

## 2015-05-03 MED ORDER — ATORVASTATIN CALCIUM 10 MG PO TABS: 5.0000 mg | ORAL_TABLET | Freq: Every day | ORAL | Status: DC

## 2015-05-03 NOTE — Telephone Encounter (Signed)
Called left vm advising  °

## 2015-05-03 NOTE — Telephone Encounter (Signed)
rx done erx 

## 2015-05-03 NOTE — Telephone Encounter (Signed)
Please advise in PCP's absence. Thanks! 

## 2015-05-03 NOTE — Telephone Encounter (Signed)
Patient called in regarding atorvastatin (LIPITOR) 10 MG tablet [id number] . There has been a discrepancy with the dosage,   We had RX .5 every other day At dr plotnikov's direction, which was to monitor as the patient sees fit, the patient switch the dosage himself to .5 daily. He has run out of the prescription early due to this change. The pharmacy will not agree to a refill because it is early. The patient states that he is out of the medication and is buying per diem at this point. He is asking that we call in a new script at this point to [facility] [address].   Please give him a call to provide clarity as to how we will need to proceed.

## 2015-05-04 ENCOUNTER — Ambulatory Visit (INDEPENDENT_AMBULATORY_CARE_PROVIDER_SITE_OTHER): Payer: Medicare Other | Admitting: *Deleted

## 2015-05-04 DIAGNOSIS — I359 Nonrheumatic aortic valve disorder, unspecified: Secondary | ICD-10-CM

## 2015-05-04 DIAGNOSIS — I712 Thoracic aortic aneurysm, without rupture, unspecified: Secondary | ICD-10-CM

## 2015-05-04 DIAGNOSIS — Z5181 Encounter for therapeutic drug level monitoring: Secondary | ICD-10-CM | POA: Diagnosis not present

## 2015-05-04 LAB — POCT INR: INR: 2.1

## 2015-05-16 ENCOUNTER — Other Ambulatory Visit (INDEPENDENT_AMBULATORY_CARE_PROVIDER_SITE_OTHER): Payer: Medicare Other

## 2015-05-16 ENCOUNTER — Ambulatory Visit (INDEPENDENT_AMBULATORY_CARE_PROVIDER_SITE_OTHER): Payer: Medicare Other | Admitting: Internal Medicine

## 2015-05-16 ENCOUNTER — Encounter: Payer: Self-pay | Admitting: Internal Medicine

## 2015-05-16 VITALS — BP 150/80 | HR 70 | Temp 98.4°F | Wt 177.0 lb

## 2015-05-16 DIAGNOSIS — G8929 Other chronic pain: Secondary | ICD-10-CM | POA: Diagnosis not present

## 2015-05-16 DIAGNOSIS — K219 Gastro-esophageal reflux disease without esophagitis: Secondary | ICD-10-CM

## 2015-05-16 DIAGNOSIS — R1013 Epigastric pain: Secondary | ICD-10-CM

## 2015-05-16 LAB — HEPATIC FUNCTION PANEL
ALT: 19 U/L (ref 0–53)
AST: 22 U/L (ref 0–37)
Albumin: 4.2 g/dL (ref 3.5–5.2)
Alkaline Phosphatase: 67 U/L (ref 39–117)
BILIRUBIN DIRECT: 0.2 mg/dL (ref 0.0–0.3)
BILIRUBIN TOTAL: 0.9 mg/dL (ref 0.2–1.2)
Total Protein: 6.5 g/dL (ref 6.0–8.3)

## 2015-05-16 LAB — CBC WITH DIFFERENTIAL/PLATELET
BASOS ABS: 0 10*3/uL (ref 0.0–0.1)
BASOS PCT: 0.1 % (ref 0.0–3.0)
EOS ABS: 0.1 10*3/uL (ref 0.0–0.7)
Eosinophils Relative: 0.8 % (ref 0.0–5.0)
HEMATOCRIT: 45.8 % (ref 39.0–52.0)
Hemoglobin: 15.8 g/dL (ref 13.0–17.0)
LYMPHS PCT: 9.9 % — AB (ref 12.0–46.0)
Lymphs Abs: 0.8 10*3/uL (ref 0.7–4.0)
MCHC: 34.5 g/dL (ref 30.0–36.0)
MCV: 93.8 fl (ref 78.0–100.0)
MONO ABS: 0.5 10*3/uL (ref 0.1–1.0)
Monocytes Relative: 6.5 % (ref 3.0–12.0)
NEUTROS ABS: 6.5 10*3/uL (ref 1.4–7.7)
Neutrophils Relative %: 82.7 % — ABNORMAL HIGH (ref 43.0–77.0)
PLATELETS: 162 10*3/uL (ref 150.0–400.0)
RBC: 4.89 Mil/uL (ref 4.22–5.81)
RDW: 13.1 % (ref 11.5–15.5)
WBC: 7.9 10*3/uL (ref 4.0–10.5)

## 2015-05-16 LAB — BASIC METABOLIC PANEL
BUN: 14 mg/dL (ref 6–23)
CALCIUM: 9.8 mg/dL (ref 8.4–10.5)
CO2: 31 meq/L (ref 19–32)
CREATININE: 1.27 mg/dL (ref 0.40–1.50)
Chloride: 104 mEq/L (ref 96–112)
GFR: 59.11 mL/min — ABNORMAL LOW (ref >60.00)
Glucose, Bld: 166 mg/dL — ABNORMAL HIGH (ref 70–99)
Potassium: 4.4 mEq/L (ref 3.5–5.1)
Sodium: 140 mEq/L (ref 135–145)

## 2015-05-16 LAB — SEDIMENTATION RATE: Sed Rate: 3 mm/hr (ref 0–22)

## 2015-05-16 LAB — H. PYLORI ANTIBODY, IGG: H PYLORI IGG: POSITIVE — AB

## 2015-05-16 MED ORDER — PANTOPRAZOLE SODIUM 40 MG PO TBEC: 40.0000 mg | DELAYED_RELEASE_TABLET | Freq: Every day | ORAL | Status: DC

## 2015-05-16 NOTE — Progress Notes (Signed)
Pre visit review using our clinic review tool, if applicable. No additional management support is needed unless otherwise documented below in the visit note. 

## 2015-05-16 NOTE — Progress Notes (Signed)
Subjective:  Patient ID: Patrick [name], male    DOB: [birth year]  Age: 75 y.o. MRN: [id number]  CC: No chief complaint on file.   HPI Patrick [name] presents for abd pain and acid -Jan 2017 after eating a burger and a milk shake. Pepcid AC helps. GERD at night if he quits Pepcid  Outpatient Prescriptions Prior to Visit  Medication Sig Dispense Refill  . amLODipine (NORVASC) 5 MG tablet take 1/2 tablet by mouth once daily 30 tablet 10  . atorvastatin (LIPITOR) 10 MG tablet Take 0.5 tablets (5 mg total) by mouth daily. 30 tablet 11  . ciprofloxacin (CIPRO) 500 MG tablet Take 1 tablet (500 mg total) by mouth 2 (two) times daily. 20 tablet 2  . cyclobenzaprine (FLEXERIL) 10 MG tablet Take 1 tablet (10 mg total) by mouth at bedtime. 20 tablet 0  . desonide (DESOWEN) 0.05 % cream Apply 1 application topically daily as needed. 60 g 3  . STAXYN 10 MG TBDP Take 10 mg by mouth every morning.  0  . SUMAtriptan (IMITREX) 50 MG tablet take 1/2 tablet by mouth every 2 hours if needed as directed 9 tablet 3  . tadalafil (CIALIS) 5 MG tablet Take 1 tablet (5 mg total) by mouth daily. 90 tablet 3  . traMADol (ULTRAM) 50 MG tablet Take 1 tablet (50 mg total) by mouth every 12 (twelve) hours as needed. 60 tablet 3  . warfarin (COUMADIN) 7.5 MG tablet Take 1 tablet by mouth daily or as directed 100 tablet 1  . amoxicillin (AMOXIL) 500 MG capsule Take 1 capsule (500 mg total) by mouth 4 (four) times daily. 20 capsule 3  . levofloxacin (LEVAQUIN) 500 MG tablet Take 1 tablet (500 mg total) by mouth daily. 10 tablet 0   No facility-administered medications prior to visit.    ROS Review of Systems  Constitutional: Negative for appetite change, fatigue and unexpected weight change.  HENT: Negative for congestion, nosebleeds, sneezing, sore throat and trouble swallowing.   Eyes: Negative for itching and visual disturbance.  Respiratory: Negative for cough.   Cardiovascular: Negative for chest pain,  palpitations and leg swelling.  Gastrointestinal: Positive for abdominal pain. Negative for nausea, vomiting, diarrhea, blood in stool, abdominal distention and rectal pain.  Genitourinary: Negative for frequency and hematuria.  Musculoskeletal: Negative for back pain, joint swelling, gait problem and neck pain.  Skin: Negative for rash.  Neurological: Negative for dizziness, tremors, speech difficulty and weakness.  Psychiatric/Behavioral: Negative for sleep disturbance, dysphoric mood and agitation. The patient is not nervous/anxious.     Objective:  BP 150/80 mmHg  Pulse 70  Temp(Src) 98.4 F (36.9 C) (Oral)  Wt 177 lb (80.287 kg)  SpO2 96%  BP Readings from Last 3 Encounters:  05/16/15 150/80  02/14/15 140/90  01/12/15 128/82    Wt Readings from Last 3 Encounters:  05/16/15 177 lb (80.287 kg)  02/14/15 177 lb (80.287 kg)  01/12/15 175 lb (79.379 kg)    Physical Exam  Constitutional: He is oriented to person, place, and time. He appears well-developed. No distress.  NAD  HENT:  Mouth/Throat: Oropharynx is clear and moist.  Eyes: Conjunctivae are normal. Pupils are equal, round, and reactive to light.  Neck: Normal range of motion. No JVD present. No thyromegaly present.  Cardiovascular: Normal rate, regular rhythm, normal heart sounds and intact distal pulses.  Exam reveals no gallop and no friction rub.   No murmur heard. Pulmonary/Chest: Effort normal and breath sounds normal. No  respiratory distress. He has no wheezes. He has no rales. He exhibits no tenderness.  Abdominal: Soft. Bowel sounds are normal. He exhibits no distension and no mass. There is no tenderness. There is no rebound and no guarding.  Musculoskeletal: Normal range of motion. He exhibits no edema or tenderness.  Lymphadenopathy:    He has no cervical adenopathy.  Neurological: He is alert and oriented to person, place, and time. He has normal reflexes. No cranial nerve deficit. He exhibits normal  muscle tone. He displays a negative Romberg sign. Coordination and gait normal.  Skin: Skin is warm and dry. No rash noted.  Psychiatric: He has a normal mood and affect. His behavior is normal. Judgment and thought content normal.    Lab Results  Component Value Date   WBC 7.9 05/16/2015   HGB 15.8 05/16/2015   HCT 45.8 05/16/2015   PLT 162.0 05/16/2015   GLUCOSE 166* 05/16/2015   CHOL 178 01/31/2015   TRIG 153.0* 01/31/2015   HDL 47.30 01/31/2015   LDLCALC 100* 01/31/2015   ALT 19 05/16/2015   AST 22 05/16/2015   NA 140 05/16/2015   K 4.4 05/16/2015   CL 104 05/16/2015   CREATININE 1.27 05/16/2015   BUN 14 05/16/2015   CO2 31 05/16/2015   TSH 0.61 01/31/2015   PSA 7.89* 02/27/2015   INR 2.1 05/04/2015   HGBA1C 5.4 03/05/2009    [place] Extrem Low Left Ltd  11/03/2014  MSK US performed of: Left This study was ordered, performed, and interpreted by Charlann Boxer D.O. Knee: All structures visualized. Chronic degenerative changes of the medial meniscus noted with continued to space been but decreased hypoechoic changes Patellar Tendon unremarkable on long and transverse views without effusion. No abnormality of prepatellar bursa. LCL and MCL unremarkable on long and transverse views. No abnormality of origin of medial or lateral head of the gastrocnemius. 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 EXAM: LEFT KNEE - COMPLETE 4+ VIEW COMPARISON:  None in PACs FINDINGS: The bones are adequately mineralized. There is minimal narrowing of the medial joint compartment. There is beaking of the lateral tibial spine. There is minimal calcification of the lateral meniscus. There is also mild calcification of the articular cartilage of the patella. There is a spur from the superior articular margin of the patella. The proximal fibula is  intact. There is no significant joint effusion. 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 There is no acute bony abnormality. Electronically Signed   By: David  Martinique M.D.   On: 11/02/2014 12:43    Assessment & Plan:   Diagnoses and all orders for this visit:  Gastroesophageal reflux disease without esophagitis -     Basic metabolic panel; Future -     CBC with Differential/Platelet; Future -     H. pylori antibody, IgG; Future -     Hepatic function panel; Future -     Sedimentation rate; Future -     US Abdomen Complete  Abdominal pain, chronic, epigastric -     Basic metabolic panel; Future -     CBC with Differential/Platelet; Future -     H. pylori antibody, IgG; Future -     Hepatic function panel; Future -     Sedimentation rate; Future -     US Abdomen Complete  Other  orders -     pantoprazole (PROTONIX) 40 MG tablet; Take 1 tablet (40 mg total) by mouth daily.  I have discontinued Mr. Blaisdell's amoxicillin and levofloxacin. I am also having him start on pantoprazole. Additionally, I am having him maintain his desonide, cyclobenzaprine, SUMAtriptan, tadalafil, STAXYN, traMADol, warfarin, ciprofloxacin, amLODipine, and atorvastatin.  Meds ordered this encounter  Medications  . pantoprazole (PROTONIX) 40 MG tablet    Sig: Take 1 tablet (40 mg total) by mouth daily.    Dispense:  30 tablet    Refill:  5     Follow-up: Return in about 6 weeks (around 06/27/2015) for a follow-up visit.  Walker Kehr, MD

## 2015-05-17 ENCOUNTER — Telehealth: Payer: Self-pay | Admitting: *Deleted

## 2015-05-17 DIAGNOSIS — R1013 Epigastric pain: Secondary | ICD-10-CM

## 2015-05-17 NOTE — Telephone Encounter (Signed)
Per [facility] Kidney needing order change to go to [place]...Johny Chess

## 2015-05-19 ENCOUNTER — Encounter: Payer: Self-pay | Admitting: Internal Medicine

## 2015-05-21 ENCOUNTER — Other Ambulatory Visit: Payer: Self-pay | Admitting: Internal Medicine

## 2015-05-21 MED ORDER — METRONIDAZOLE 250 MG PO TABS: 250.0000 mg | ORAL_TABLET | Freq: Four times a day (QID) | ORAL | Status: DC

## 2015-05-21 MED ORDER — BISMUTH 262 MG PO CHEW: 524.0000 mg | CHEWABLE_TABLET | Freq: Four times a day (QID) | ORAL | Status: DC

## 2015-05-21 MED ORDER — DOXYCYCLINE HYCLATE 100 MG PO TABS: 100.0000 mg | ORAL_TABLET | Freq: Two times a day (BID) | ORAL | Status: DC

## 2015-05-21 MED ORDER — PANTOPRAZOLE SODIUM 40 MG PO TBEC: 40.0000 mg | DELAYED_RELEASE_TABLET | Freq: Every day | ORAL | Status: DC

## 2015-05-22 ENCOUNTER — Encounter: Payer: Self-pay | Admitting: Internal Medicine

## 2015-06-01 ENCOUNTER — Ambulatory Visit
Admission: RE | Admit: 2015-06-01 | Discharge: 2015-06-01 | Disposition: A | Discharge status: Home / Self Care | Payer: Medicare Other | Source: Ambulatory Visit | Attending: Internal Medicine | Admitting: Internal Medicine

## 2015-06-01 ENCOUNTER — Other Ambulatory Visit: Payer: Medicare Other

## 2015-06-01 DIAGNOSIS — N281 Cyst of kidney, acquired: Secondary | ICD-10-CM | POA: Diagnosis not present

## 2015-06-04 ENCOUNTER — Ambulatory Visit (INDEPENDENT_AMBULATORY_CARE_PROVIDER_SITE_OTHER): Payer: Medicare Other

## 2015-06-04 DIAGNOSIS — I712 Thoracic aortic aneurysm, without rupture, unspecified: Secondary | ICD-10-CM

## 2015-06-04 DIAGNOSIS — Z5181 Encounter for therapeutic drug level monitoring: Secondary | ICD-10-CM | POA: Diagnosis not present

## 2015-06-04 DIAGNOSIS — I359 Nonrheumatic aortic valve disorder, unspecified: Secondary | ICD-10-CM

## 2015-06-04 LAB — POCT INR: INR: 4.9

## 2015-06-11 ENCOUNTER — Ambulatory Visit (INDEPENDENT_AMBULATORY_CARE_PROVIDER_SITE_OTHER): Payer: Medicare Other | Admitting: Cardiovascular Disease

## 2015-06-11 ENCOUNTER — Ambulatory Visit (INDEPENDENT_AMBULATORY_CARE_PROVIDER_SITE_OTHER): Payer: Medicare Other | Admitting: Pharmacist

## 2015-06-11 ENCOUNTER — Encounter: Payer: Self-pay | Admitting: Cardiovascular Disease

## 2015-06-11 VITALS — BP 130/80 | HR 62 | Ht 72.0 in | Wt 172.4 lb

## 2015-06-11 DIAGNOSIS — I10 Essential (primary) hypertension: Secondary | ICD-10-CM | POA: Diagnosis not present

## 2015-06-11 DIAGNOSIS — I359 Nonrheumatic aortic valve disorder, unspecified: Secondary | ICD-10-CM

## 2015-06-11 DIAGNOSIS — Z5181 Encounter for therapeutic drug level monitoring: Secondary | ICD-10-CM

## 2015-06-11 DIAGNOSIS — I712 Thoracic aortic aneurysm, without rupture, unspecified: Secondary | ICD-10-CM

## 2015-06-11 LAB — POCT INR: INR: 2.4

## 2015-06-11 NOTE — Patient Instructions (Signed)
Medication Instructions:  Your physician recommends that you continue on your current medications as directed. Please refer to the Current Medication list given to you today.   Labwork: none  Testing/Procedures: Your physician has requested that you have an echocardiogram. Echocardiography is a painless test that uses sound waves to create images of your heart. It provides your doctor with information about the size and shape of your heart and how well your heart's chambers and valves are working. This procedure takes approximately one hour. There are no restrictions for this procedure. To be done in one year--week or so prior to appt with Dr. Angelena Form    Follow-Up: Your physician wants you to follow-up in: 12 months. You will receive a reminder letter in the mail two months in advance. If you don't receive a letter, please call our office to schedule the follow-up appointment.   Any Other Special Instructions Will Be Listed Below (If Applicable).     If you need a refill on your cardiac medications before your next appointment, please call your pharmacy.
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
## 2015-06-27 ENCOUNTER — Ambulatory Visit (INDEPENDENT_AMBULATORY_CARE_PROVIDER_SITE_OTHER): Payer: Medicare Other | Admitting: Internal Medicine

## 2015-06-27 ENCOUNTER — Encounter: Payer: Self-pay | Admitting: Internal Medicine

## 2015-06-27 VITALS — BP 120/74 | HR 64 | Wt 174.0 lb

## 2015-06-27 DIAGNOSIS — Z7901 Long term (current) use of anticoagulants: Secondary | ICD-10-CM

## 2015-06-27 DIAGNOSIS — K219 Gastro-esophageal reflux disease without esophagitis: Secondary | ICD-10-CM | POA: Diagnosis not present

## 2015-06-27 DIAGNOSIS — Z7189 Other specified counseling: Secondary | ICD-10-CM

## 2015-06-27 DIAGNOSIS — Z7184 Encounter for health counseling related to travel: Secondary | ICD-10-CM | POA: Insufficient documentation

## 2015-06-27 MED ORDER — METRONIDAZOLE 500 MG PO TABS: 500.0000 mg | ORAL_TABLET | Freq: Three times a day (TID) | ORAL | Status: DC

## 2015-06-27 NOTE — Assessment & Plan Note (Signed)
Flagyl Rx Cipro Rx Vaccination reviewed

## 2015-06-27 NOTE — Assessment & Plan Note (Signed)
On Coumadin 

## 2015-06-27 NOTE — Progress Notes (Signed)
Pre visit review using our clinic review tool, if applicable. No additional management support is needed unless otherwise documented below in the visit note. 

## 2015-06-27 NOTE — Progress Notes (Signed)
Subjective:  Patient ID: Patrick [name], male    DOB: 1940/12/06  Age: 75 y.o. MRN: [id number]  CC: No chief complaint on file.   HPI Patrick [name] presents for GERD f/u - doing well on Protonix  Outpatient Prescriptions Prior to Visit  Medication Sig Dispense Refill  . amLODipine (NORVASC) 5 MG tablet take 1/2 tablet by mouth once daily 30 tablet 10  . atorvastatin (LIPITOR) 10 MG tablet Take 0.5 tablets (5 mg total) by mouth daily. 30 tablet 11  . desonide (DESOWEN) 0.05 % cream Apply 1 application topically daily as needed. 60 g 3  . pantoprazole (PROTONIX) 40 MG tablet Take 1 tablet (40 mg total) by mouth daily. 30 tablet 5  . STAXYN 10 MG TBDP Take 10 mg by mouth daily as needed.   0  . SUMAtriptan (IMITREX) 50 MG tablet take 1/2 tablet by mouth every 2 hours if needed as directed 9 tablet 3  . tadalafil (CIALIS) 5 MG tablet Take 1 tablet (5 mg total) by mouth daily. 90 tablet 3  . warfarin (COUMADIN) 7.5 MG tablet Take 1 tablet by mouth daily or as directed 100 tablet 1   No facility-administered medications prior to visit.    ROS Review of Systems  Constitutional: Negative for appetite change, fatigue and unexpected weight change.  HENT: Negative for congestion, nosebleeds, sneezing, sore throat and trouble swallowing.   Eyes: Negative for itching and visual disturbance.  Respiratory: Negative for cough.   Cardiovascular: Negative for chest pain, palpitations and leg swelling.  Gastrointestinal: Negative for nausea, vomiting, abdominal pain, diarrhea, blood in stool and abdominal distention.  Genitourinary: Negative for frequency and hematuria.  Musculoskeletal: Negative for back pain, joint swelling, gait problem and neck pain.  Skin: Negative for rash.  Neurological: Negative for dizziness, tremors, speech difficulty and weakness.  Psychiatric/Behavioral: Negative for sleep disturbance, dysphoric mood and agitation. The patient is not nervous/anxious.     Objective:    BP 120/74 mmHg  Pulse 64  Wt 174 lb (78.926 kg)  SpO2 96%  BP Readings from Last 3 Encounters:  06/27/15 120/74  06/11/15 130/80  05/16/15 150/80    Wt Readings from Last 3 Encounters:  06/27/15 174 lb (78.926 kg)  06/11/15 172 lb 6.4 oz (78.2 kg)  05/16/15 177 lb (80.287 kg)    Physical Exam  Constitutional: He is oriented to person, place, and time. He appears well-developed. No distress.  NAD  HENT:  Mouth/Throat: Oropharynx is clear and moist.  Eyes: Conjunctivae are normal. Pupils are equal, round, and reactive to light.  Neck: Normal range of motion. No JVD present. No thyromegaly present.  Cardiovascular: Normal rate, regular rhythm, normal heart sounds and intact distal pulses.  Exam reveals no gallop and no friction rub.   No murmur heard. Pulmonary/Chest: Effort normal and breath sounds normal. No respiratory distress. He has no wheezes. He has no rales. He exhibits no tenderness.  Abdominal: Soft. Bowel sounds are normal. He exhibits no distension and no mass. There is no tenderness. There is no rebound and no guarding.  Musculoskeletal: Normal range of motion. He exhibits no edema or tenderness.  Lymphadenopathy:    He has no cervical adenopathy.  Neurological: He is alert and oriented to person, place, and time. He has normal reflexes. No cranial nerve deficit. He exhibits normal muscle tone. He displays a negative Romberg sign. Coordination and gait normal.  Skin: Skin is warm and dry. No rash noted.  Psychiatric: He has a  normal mood and affect. His behavior is normal. Judgment and thought content normal.    Lab Results  Component Value Date   WBC 7.9 05/16/2015   HGB 15.8 05/16/2015   HCT 45.8 05/16/2015   PLT 162.0 05/16/2015   GLUCOSE 166* 05/16/2015   CHOL 178 01/31/2015   TRIG 153.0* 01/31/2015   HDL 47.30 01/31/2015   LDLCALC 100* 01/31/2015   ALT 19 05/16/2015   AST 22 05/16/2015   NA 140 05/16/2015   K 4.4 05/16/2015   CL 104 05/16/2015    CREATININE 1.27 05/16/2015   BUN 14 05/16/2015   CO2 31 05/16/2015   TSH 0.61 01/31/2015   PSA 7.89* 02/27/2015   INR 2.4 06/11/2015   HGBA1C 5.4 03/05/2009    US Abdomen Complete  06/01/2015  CLINICAL DATA:  Chronic epigastric abdominal pain. EXAM: ABDOMEN ULTRASOUND COMPLETE COMPARISON:  CT scan of March 10, 2006. FINDINGS: Gallbladder: 2 non mobile echogenic foci are noted which are non shadowing, most consistent with small polyps. These measure 4 mm. No definite gallstones or gallbladder wall thickening is noted. No pericholecystic fluid is noted. No sonographic Murphy's sign is noted. Common bile duct: Diameter: 2.2 mm which is within normal limits. Liver: Multiple cysts are noted, with the largest measuring 4.8 cm in the right hepatic lobe. Within normal limits in parenchymal echogenicity. IVC: No abnormality visualized. Pancreas: Visualized portion unremarkable. Spleen: Size and appearance within normal limits. Right Kidney: Length: 10.4 cm. Multiple simple cysts are noted with the largest measuring 9 cm. Echogenicity within normal limits. No mass or hydronephrosis visualized. Left Kidney: Length: 11.5 cm. 4.7 cm simple cyst is noted in midpole. Echogenicity within normal limits. No mass or hydronephrosis visualized. Abdominal aorta: No aneurysm visualized. Other findings: None. IMPRESSION: Simple hepatic and bilateral renal cysts are noted. 2 small gallbladder polyps are noted. No other significant abnormality seen in the abdomen. Electronically Signed   By: Marijo Conception, M.D.   On: 06/01/2015 08:55    Assessment & Plan:   There are no diagnoses linked to this encounter. I am having Patrick [name] maintain his desonide, SUMAtriptan, tadalafil, STAXYN, warfarin, amLODipine, atorvastatin, and pantoprazole.  No orders of the defined types were placed in this encounter.     Follow-up: No Follow-up on file.  Patrick Kehr, MD

## 2015-06-27 NOTE — Assessment & Plan Note (Addendum)
5/17 doing well on Protonix Ba swallow or EGD suggested, especially if sx's change

## 2015-07-02 ENCOUNTER — Ambulatory Visit (INDEPENDENT_AMBULATORY_CARE_PROVIDER_SITE_OTHER): Payer: Medicare Other | Admitting: *Deleted

## 2015-07-02 DIAGNOSIS — I359 Nonrheumatic aortic valve disorder, unspecified: Secondary | ICD-10-CM | POA: Diagnosis not present

## 2015-07-02 DIAGNOSIS — I712 Thoracic aortic aneurysm, without rupture, unspecified: Secondary | ICD-10-CM

## 2015-07-02 DIAGNOSIS — Z5181 Encounter for therapeutic drug level monitoring: Secondary | ICD-10-CM

## 2015-07-02 LAB — POCT INR: INR: 1.9

## 2015-07-05 ENCOUNTER — Encounter: Payer: Self-pay | Admitting: Podiatry

## 2015-07-05 ENCOUNTER — Ambulatory Visit (INDEPENDENT_AMBULATORY_CARE_PROVIDER_SITE_OTHER): Payer: Medicare Other | Admitting: Podiatry

## 2015-07-05 DIAGNOSIS — B351 Tinea unguium: Secondary | ICD-10-CM

## 2015-07-06 NOTE — Progress Notes (Signed)
Subjective:     Patient ID: Patrick [name], male   DOB: [birth year], 75 y.o.   MRN: [id number]  HPI patient presents stating my nails are still thickened   Review of Systems     Objective:   Physical Exam Neurovascular status intact with continued thickness of hallux nail bilateral and several smaller nails despite laser and Diflucan    Assessment:     Resistant fungal infection    Plan:     Mechanical debridement of nails accomplished and discussed that there is nothing else at this point that we can do and I do not recommend further laser

## 2015-07-10 DIAGNOSIS — L738 Other specified follicular disorders: Secondary | ICD-10-CM | POA: Diagnosis not present

## 2015-07-10 DIAGNOSIS — L812 Freckles: Secondary | ICD-10-CM | POA: Diagnosis not present

## 2015-07-10 DIAGNOSIS — L570 Actinic keratosis: Secondary | ICD-10-CM | POA: Diagnosis not present

## 2015-07-10 DIAGNOSIS — Z85828 Personal history of other malignant neoplasm of skin: Secondary | ICD-10-CM | POA: Diagnosis not present

## 2015-07-10 DIAGNOSIS — D485 Neoplasm of uncertain behavior of skin: Secondary | ICD-10-CM | POA: Diagnosis not present

## 2015-07-10 DIAGNOSIS — L309 Dermatitis, unspecified: Secondary | ICD-10-CM | POA: Diagnosis not present

## 2015-07-10 DIAGNOSIS — L821 Other seborrheic keratosis: Secondary | ICD-10-CM | POA: Diagnosis not present

## 2015-07-19 ENCOUNTER — Encounter: Payer: Self-pay | Admitting: Family Medicine

## 2015-07-19 ENCOUNTER — Ambulatory Visit (INDEPENDENT_AMBULATORY_CARE_PROVIDER_SITE_OTHER): Payer: Medicare Other | Admitting: Family Medicine

## 2015-07-19 ENCOUNTER — Other Ambulatory Visit: Payer: Self-pay

## 2015-07-19 VITALS — BP 120/80 | HR 50 | Ht 72.0 in | Wt 177.0 lb

## 2015-07-19 DIAGNOSIS — M25511 Pain in right shoulder: Secondary | ICD-10-CM | POA: Diagnosis not present

## 2015-07-19 NOTE — Patient Instructions (Signed)
Good to see you  Ice 20 minutes 2 times daily. Usually after activity and before bed. Avoid any movement where you can't see your hands in your peripheral vision.  pennsaid pinkie amount topically 2 times daily as needed.  Stay active  [place] day  See me again in 4 weeks if not gone.
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
## 2015-07-19 NOTE — Progress Notes (Signed)
Pre visit review using our clinic review tool, if applicable. No additional management support is needed unless otherwise documented below in the visit note. 

## 2015-07-24 ENCOUNTER — Ambulatory Visit (INDEPENDENT_AMBULATORY_CARE_PROVIDER_SITE_OTHER): Payer: Medicare Other

## 2015-07-24 DIAGNOSIS — I712 Thoracic aortic aneurysm, without rupture, unspecified: Secondary | ICD-10-CM

## 2015-07-24 DIAGNOSIS — Z5181 Encounter for therapeutic drug level monitoring: Secondary | ICD-10-CM | POA: Diagnosis not present

## 2015-07-24 DIAGNOSIS — I359 Nonrheumatic aortic valve disorder, unspecified: Secondary | ICD-10-CM

## 2015-07-24 LAB — POCT INR: INR: 2.2

## 2015-08-06 DIAGNOSIS — H43812 Vitreous degeneration, left eye: Secondary | ICD-10-CM | POA: Diagnosis not present

## 2015-08-09 ENCOUNTER — Other Ambulatory Visit: Payer: Self-pay | Admitting: Cardiovascular Disease

## 2015-08-24 ENCOUNTER — Ambulatory Visit (INDEPENDENT_AMBULATORY_CARE_PROVIDER_SITE_OTHER): Payer: Medicare Other

## 2015-08-24 DIAGNOSIS — I359 Nonrheumatic aortic valve disorder, unspecified: Secondary | ICD-10-CM | POA: Diagnosis not present

## 2015-08-24 DIAGNOSIS — I712 Thoracic aortic aneurysm, without rupture, unspecified: Secondary | ICD-10-CM

## 2015-08-24 DIAGNOSIS — R972 Elevated prostate specific antigen [PSA]: Secondary | ICD-10-CM | POA: Diagnosis not present

## 2015-08-24 DIAGNOSIS — Z5181 Encounter for therapeutic drug level monitoring: Secondary | ICD-10-CM

## 2015-08-24 LAB — POCT INR: INR: 4.1

## 2015-09-03 DIAGNOSIS — N401 Enlarged prostate with lower urinary tract symptoms: Secondary | ICD-10-CM | POA: Diagnosis not present

## 2015-09-03 DIAGNOSIS — R972 Elevated prostate specific antigen [PSA]: Secondary | ICD-10-CM | POA: Diagnosis not present

## 2015-09-03 DIAGNOSIS — R3912 Poor urinary stream: Secondary | ICD-10-CM | POA: Diagnosis not present

## 2015-09-04 DIAGNOSIS — H43812 Vitreous degeneration, left eye: Secondary | ICD-10-CM | POA: Diagnosis not present

## 2015-09-14 ENCOUNTER — Ambulatory Visit (INDEPENDENT_AMBULATORY_CARE_PROVIDER_SITE_OTHER): Payer: Medicare Other | Admitting: *Deleted

## 2015-09-14 DIAGNOSIS — I712 Thoracic aortic aneurysm, without rupture, unspecified: Secondary | ICD-10-CM

## 2015-09-14 DIAGNOSIS — I359 Nonrheumatic aortic valve disorder, unspecified: Secondary | ICD-10-CM

## 2015-09-14 DIAGNOSIS — Z5181 Encounter for therapeutic drug level monitoring: Secondary | ICD-10-CM | POA: Diagnosis not present

## 2015-09-14 LAB — POCT INR: INR: 2.3

## 2015-09-20 DIAGNOSIS — Z85828 Personal history of other malignant neoplasm of skin: Secondary | ICD-10-CM | POA: Diagnosis not present

## 2015-09-20 DIAGNOSIS — L570 Actinic keratosis: Secondary | ICD-10-CM | POA: Diagnosis not present

## 2015-09-21 ENCOUNTER — Other Ambulatory Visit: Payer: Self-pay | Admitting: *Deleted

## 2015-09-21 MED ORDER — TADALAFIL 5 MG PO TABS: 5.0000 mg | ORAL_TABLET | Freq: Every day | ORAL | 1 refills | Status: DC

## 2015-09-21 NOTE — Telephone Encounter (Signed)
Rec'd fax from [facility] pt requesting refills on his Cialis. Faxed form back to [phone] with approval.../lmb

## 2015-10-12 ENCOUNTER — Ambulatory Visit (INDEPENDENT_AMBULATORY_CARE_PROVIDER_SITE_OTHER): Payer: Medicare Other | Admitting: Pharmacist

## 2015-10-12 DIAGNOSIS — Z5181 Encounter for therapeutic drug level monitoring: Secondary | ICD-10-CM | POA: Diagnosis not present

## 2015-10-12 DIAGNOSIS — I359 Nonrheumatic aortic valve disorder, unspecified: Secondary | ICD-10-CM | POA: Diagnosis not present

## 2015-10-12 DIAGNOSIS — I712 Thoracic aortic aneurysm, without rupture, unspecified: Secondary | ICD-10-CM

## 2015-10-12 LAB — POCT INR: INR: 2.2

## 2015-11-09 ENCOUNTER — Ambulatory Visit (INDEPENDENT_AMBULATORY_CARE_PROVIDER_SITE_OTHER): Payer: Medicare Other | Admitting: Pharmacist

## 2015-11-09 DIAGNOSIS — I359 Nonrheumatic aortic valve disorder, unspecified: Secondary | ICD-10-CM

## 2015-11-09 DIAGNOSIS — Z5181 Encounter for therapeutic drug level monitoring: Secondary | ICD-10-CM

## 2015-11-09 DIAGNOSIS — I712 Thoracic aortic aneurysm, without rupture, unspecified: Secondary | ICD-10-CM

## 2015-11-09 LAB — POCT INR: INR: 3.6

## 2015-11-30 ENCOUNTER — Ambulatory Visit (INDEPENDENT_AMBULATORY_CARE_PROVIDER_SITE_OTHER): Payer: Medicare Other | Admitting: Pharmacist

## 2015-11-30 DIAGNOSIS — I359 Nonrheumatic aortic valve disorder, unspecified: Secondary | ICD-10-CM

## 2015-11-30 DIAGNOSIS — I712 Thoracic aortic aneurysm, without rupture, unspecified: Secondary | ICD-10-CM

## 2015-11-30 DIAGNOSIS — Z5181 Encounter for therapeutic drug level monitoring: Secondary | ICD-10-CM

## 2015-11-30 LAB — POCT INR: INR: 2

## 2015-12-19 ENCOUNTER — Other Ambulatory Visit: Payer: Self-pay | Admitting: Internal Medicine

## 2015-12-28 ENCOUNTER — Ambulatory Visit (INDEPENDENT_AMBULATORY_CARE_PROVIDER_SITE_OTHER): Payer: Medicare Other | Admitting: *Deleted

## 2015-12-28 DIAGNOSIS — I359 Nonrheumatic aortic valve disorder, unspecified: Secondary | ICD-10-CM | POA: Diagnosis not present

## 2015-12-28 DIAGNOSIS — I712 Thoracic aortic aneurysm, without rupture, unspecified: Secondary | ICD-10-CM

## 2015-12-28 DIAGNOSIS — Z5181 Encounter for therapeutic drug level monitoring: Secondary | ICD-10-CM

## 2015-12-28 LAB — POCT INR: INR: 2.9

## 2016-01-15 DIAGNOSIS — L821 Other seborrheic keratosis: Secondary | ICD-10-CM | POA: Diagnosis not present

## 2016-01-15 DIAGNOSIS — Z85828 Personal history of other malignant neoplasm of skin: Secondary | ICD-10-CM | POA: Diagnosis not present

## 2016-01-15 DIAGNOSIS — L218 Other seborrheic dermatitis: Secondary | ICD-10-CM | POA: Diagnosis not present

## 2016-01-15 DIAGNOSIS — L812 Freckles: Secondary | ICD-10-CM | POA: Diagnosis not present

## 2016-01-15 DIAGNOSIS — L570 Actinic keratosis: Secondary | ICD-10-CM | POA: Diagnosis not present

## 2016-01-15 DIAGNOSIS — L853 Xerosis cutis: Secondary | ICD-10-CM | POA: Diagnosis not present

## 2016-01-19 DIAGNOSIS — Z23 Encounter for immunization: Secondary | ICD-10-CM | POA: Diagnosis not present

## 2016-01-21 ENCOUNTER — Other Ambulatory Visit: Payer: Self-pay | Admitting: Internal Medicine

## 2016-01-22 ENCOUNTER — Ambulatory Visit (INDEPENDENT_AMBULATORY_CARE_PROVIDER_SITE_OTHER): Payer: Medicare Other | Admitting: Pharmacist

## 2016-01-22 DIAGNOSIS — I359 Nonrheumatic aortic valve disorder, unspecified: Secondary | ICD-10-CM

## 2016-01-22 DIAGNOSIS — I712 Thoracic aortic aneurysm, without rupture, unspecified: Secondary | ICD-10-CM

## 2016-01-22 DIAGNOSIS — Z5181 Encounter for therapeutic drug level monitoring: Secondary | ICD-10-CM

## 2016-01-22 LAB — POCT INR: INR: 2.7

## 2016-02-08 ENCOUNTER — Ambulatory Visit (INDEPENDENT_AMBULATORY_CARE_PROVIDER_SITE_OTHER): Payer: Medicare Other | Admitting: Internal Medicine

## 2016-02-08 ENCOUNTER — Encounter: Payer: Self-pay | Admitting: Internal Medicine

## 2016-02-08 DIAGNOSIS — M7711 Lateral epicondylitis, right elbow: Secondary | ICD-10-CM

## 2016-02-08 DIAGNOSIS — M7710 Lateral epicondylitis, unspecified elbow: Secondary | ICD-10-CM | POA: Insufficient documentation

## 2016-02-08 MED ORDER — DICLOFENAC SODIUM 2 % TD SOLN: 1.0000 "application " | Freq: Four times a day (QID) | TRANSDERMAL | 3 refills | Status: DC | PRN

## 2016-02-08 NOTE — Progress Notes (Signed)
Pre visit review using our clinic review tool, if applicable. No additional management support is needed unless otherwise documented below in the visit note. 

## 2016-02-08 NOTE — Progress Notes (Signed)
Subjective:  Patient ID: Patrick [name], male    DOB: 11-Apr-1940  Age: 75 y.o. MRN: [id number]  CC: Elbow Injury (Right elbow pain after repetitive heavy lifting while in [place]. States he hears "a popping" sound now when bending)   HPI Patrick [name] presents for R elbow pain and popping - better  C/o R inguinal area was painful - now resolved  Outpatient Medications Prior to Visit  Medication Sig Dispense Refill  . amLODipine (NORVASC) 5 MG tablet take 1/2 tablet by mouth once daily 30 tablet 10  . amoxicillin (AMOXIL) 500 MG capsule take 1 capsule by mouth four times a day 20 capsule 3  . atorvastatin (LIPITOR) 10 MG tablet Take 0.5 tablets (5 mg total) by mouth daily. 30 tablet 11  . pantoprazole (PROTONIX) 40 MG tablet take 1 tablet by mouth twice a day for 2 weeks WITH ANTIBIOTIC-THEN CONTINUE ONCE A DAY 30 tablet 5  . STAXYN 10 MG TBDP Take 10 mg by mouth daily as needed.   0  . SUMAtriptan (IMITREX) 50 MG tablet take 1/2 tablet by mouth every 2 hours if needed as directed 9 tablet 3  . tadalafil (CIALIS) 5 MG tablet Take 1 tablet (5 mg total) by mouth daily. 90 tablet 1  . warfarin (COUMADIN) 7.5 MG tablet take 1 tablet by mouth once daily 90 tablet 1  . desonide (DESOWEN) 0.05 % cream Apply 1 application topically daily as needed. (Patient not taking: Reported on 02/08/2016) 60 g 3   No facility-administered medications prior to visit.     ROS Review of Systems  Constitutional: Negative for appetite change, fatigue and unexpected weight change.  HENT: Negative for congestion, nosebleeds, sneezing, sore throat and trouble swallowing.   Eyes: Negative for itching and visual disturbance.  Respiratory: Negative for cough.   Cardiovascular: Negative for chest pain, palpitations and leg swelling.  Gastrointestinal: Negative for abdominal distention, blood in stool, diarrhea and nausea.  Genitourinary: Negative for frequency and hematuria.  Musculoskeletal: Positive for  arthralgias. Negative for back pain, gait problem, joint swelling and neck pain.  Skin: Negative for rash.  Neurological: Negative for dizziness, tremors, speech difficulty and weakness.  Psychiatric/Behavioral: Negative for agitation, dysphoric mood and sleep disturbance. The patient is not nervous/anxious.     Objective:  BP 140/82   Pulse (!) 51   Wt 179 lb (81.2 kg)   SpO2 97%   BMI 24.28 kg/m   BP Readings from Last 3 Encounters:  02/08/16 140/82  07/19/15 120/80  06/27/15 120/74    Wt Readings from Last 3 Encounters:  02/08/16 179 lb (81.2 kg)  07/19/15 177 lb (80.3 kg)  06/27/15 174 lb (78.9 kg)    Physical Exam  Constitutional: He is oriented to person, place, and time. He appears well-developed. No distress.  NAD  HENT:  Mouth/Throat: Oropharynx is clear and moist.  Eyes: Conjunctivae are normal. Pupils are equal, round, and reactive to light.  Neck: Normal range of motion. No JVD present. No thyromegaly present.  Cardiovascular: Normal rate, regular rhythm, normal heart sounds and intact distal pulses.  Exam reveals no gallop and no friction rub.   No murmur heard. Pulmonary/Chest: Effort normal and breath sounds normal. No respiratory distress. He has no wheezes. He has no rales. He exhibits no tenderness.  Abdominal: Soft. Bowel sounds are normal. He exhibits no distension and no mass. There is no tenderness. There is no rebound and no guarding.  Musculoskeletal: Normal range of motion. He exhibits  no edema or tenderness.  Lymphadenopathy:    He has no cervical adenopathy.  Neurological: He is alert and oriented to person, place, and time. He has normal reflexes. No cranial nerve deficit. He exhibits normal muscle tone. He displays a negative Romberg sign. Coordination and gait normal.  Skin: Skin is warm and dry. No rash noted.  Psychiatric: He has a normal mood and affect. His behavior is normal. Judgment and thought content normal.  R lat epicondyl is tender;  some clicking is noticeable with movement, fullness on palpation  Lab Results  Component Value Date   WBC 7.9 05/16/2015   HGB 15.8 05/16/2015   HCT 45.8 05/16/2015   PLT 162.0 05/16/2015   GLUCOSE 166 (H) 05/16/2015   CHOL 178 01/31/2015   TRIG 153.0 (H) 01/31/2015   HDL 47.30 01/31/2015   LDLCALC 100 (H) 01/31/2015   ALT 19 05/16/2015   AST 22 05/16/2015   NA 140 05/16/2015   K 4.4 05/16/2015   CL 104 05/16/2015   CREATININE 1.27 05/16/2015   BUN 14 05/16/2015   CO2 31 05/16/2015   TSH 0.61 01/31/2015   PSA 7.89 (H) 02/27/2015   INR 2.7 01/22/2016   HGBA1C 5.4 03/05/2009    US Abdomen Complete  Result Date: 06/01/2015 CLINICAL DATA:  Chronic epigastric abdominal pain. EXAM: ABDOMEN ULTRASOUND COMPLETE COMPARISON:  CT scan of March 10, 2006. FINDINGS: Gallbladder: 2 non mobile echogenic foci are noted which are non shadowing, most consistent with small polyps. These measure 4 mm. No definite gallstones or gallbladder wall thickening is noted. No pericholecystic fluid is noted. No sonographic Murphy's sign is noted. Common bile duct: Diameter: 2.2 mm which is within normal limits. Liver: Multiple cysts are noted, with the largest measuring 4.8 cm in the right hepatic lobe. Within normal limits in parenchymal echogenicity. IVC: No abnormality visualized. Pancreas: Visualized portion unremarkable. Spleen: Size and appearance within normal limits. Right Kidney: Length: 10.4 cm. Multiple simple cysts are noted with the largest measuring 9 cm. Echogenicity within normal limits. No mass or hydronephrosis visualized. Left Kidney: Length: 11.5 cm. 4.7 cm simple cyst is noted in midpole. Echogenicity within normal limits. No mass or hydronephrosis visualized. Abdominal aorta: No aneurysm visualized. Other findings: None. IMPRESSION: Simple hepatic and bilateral renal cysts are noted. 2 small gallbladder polyps are noted. No other significant abnormality seen in the abdomen. Electronically Signed    By: Marijo Conception, M.D.   On: 06/01/2015 08:55    Assessment & Plan:   There are no diagnoses linked to this encounter. I am having Patrick [name] maintain his desonide, SUMAtriptan, STAXYN, amLODipine, atorvastatin, warfarin, tadalafil, pantoprazole, and amoxicillin.  No orders of the defined types were placed in this encounter.    Follow-up: No Follow-up on file.  Walker Kehr, MD

## 2016-02-08 NOTE — Assessment & Plan Note (Signed)
Pennsaid Elbow sleeve Ice/heat Sports Med consult if not better

## 2016-02-08 NOTE — Patient Instructions (Signed)
McKenzie inflatable back pillow   Tennis Elbow Tennis elbow (lateral epicondylitis) is inflammation of the outer tendons of your forearm close to your elbow. Your tendons attach your muscles to your bones. The outer tendons of your forearm are used to extend your wrist, and they attach on the outside part of your elbow. Tennis elbow is often found in people who play tennis, but anyone may get the condition from repeatedly extending the wrist or turning the forearm. What are the causes? This condition is caused by repeatedly extending your wrist and using your hands. It can result from sports or work that requires repetitive forearm movements. Tennis elbow may also be caused by an injury. What increases the risk? You have a higher risk of developing tennis elbow if you play tennis or another racquet sport. You also have a higher risk if you frequently use your hands for work. This condition is also more likely to develop in:  Musicians.  Carpenters, painters, and plumbers.  Cooks.  Cashiers.  People who work in [employer].  Architect workers.  Butchers.  People who use computers. What are the signs or symptoms? Symptoms of this condition include:  Pain and tenderness in your forearm and the outer part of your elbow. You may only feel the pain when you use your arm, or you may feel it even when you are not using your arm.  A burning feeling that runs from your elbow through your arm.  Weak grip in your hands. How is this diagnosed? This condition may be diagnosed by medical history and physical exam. You may also have other tests, including:  X-rays.  MRI. How is this treated? Your health care provider will recommend lifestyle adjustments, such as resting and icing your arm. Treatment may also include:  Medicines for inflammation. This may include shots of cortisone if your pain continues.  Physical therapy. This may include massage or exercises.  An elbow  brace. Surgery may eventually be recommended if your pain does not go away with treatment. Follow these instructions at home: Activity  Rest your elbow and wrist as directed by your health care provider. Try to avoid any activities that caused the problem until your health care provider says that you can do them again.  If a physical therapist teaches you exercises, do all of them as directed.  If you lift an object, lift it with your palm facing upward. This lowers the stress on your elbow. Lifestyle  If your tennis elbow is caused by sports, check your equipment and make sure that:  You are using it correctly.  It is the best fit for you.  If your tennis elbow is caused by work, take breaks frequently, if you are able. Talk with your manager about how to best perform tasks in a way that is safe.  If your tennis elbow is caused by computer use, talk with your manager about any changes that can be made to your work environment. General instructions  If directed, apply ice to the painful area:  Put ice in a plastic bag.  Place a towel between your skin and the bag.  Leave the ice on for 20 minutes, 2-3 times per day.  Take medicines only as directed by your health care provider.  If you were given a brace, wear it as directed by your health care provider.  Keep all follow-up visits as directed by your health care provider. This is important. Contact a health care provider if:  Your pain  does not get better with treatment.  Your pain gets worse.  You have numbness or weakness in your forearm, hand, or fingers. This information is not intended to replace advice given to you by your health care provider. Make sure you discuss any questions you have with your health care provider. Document Released: 02/10/2005 Document Revised: 10/11/2015 Document Reviewed: 02/06/2014 Elsevier Interactive Patient Education  2017 [employer].

## 2016-02-12 ENCOUNTER — Other Ambulatory Visit: Payer: Self-pay | Admitting: Cardiovascular Disease

## 2016-02-14 ENCOUNTER — Telehealth: Payer: Self-pay | Admitting: Internal Medicine

## 2016-02-14 NOTE — Telephone Encounter (Signed)
Spoke with patient to schedule annual wellness visit. Pt stated that he would like to wait to schedule his annual wellness visit for the later part of 2018.

## 2016-03-04 ENCOUNTER — Ambulatory Visit (INDEPENDENT_AMBULATORY_CARE_PROVIDER_SITE_OTHER): Payer: Medicare Other | Admitting: Pharmacist

## 2016-03-04 DIAGNOSIS — I712 Thoracic aortic aneurysm, without rupture, unspecified: Secondary | ICD-10-CM

## 2016-03-04 DIAGNOSIS — Z5181 Encounter for therapeutic drug level monitoring: Secondary | ICD-10-CM

## 2016-03-04 DIAGNOSIS — I359 Nonrheumatic aortic valve disorder, unspecified: Secondary | ICD-10-CM | POA: Diagnosis not present

## 2016-03-04 LAB — POCT INR: INR: 2.6

## 2016-03-14 DIAGNOSIS — R972 Elevated prostate specific antigen [PSA]: Secondary | ICD-10-CM | POA: Diagnosis not present

## 2016-03-26 DIAGNOSIS — N401 Enlarged prostate with lower urinary tract symptoms: Secondary | ICD-10-CM | POA: Diagnosis not present

## 2016-03-26 DIAGNOSIS — R3912 Poor urinary stream: Secondary | ICD-10-CM | POA: Diagnosis not present

## 2016-03-26 DIAGNOSIS — R972 Elevated prostate specific antigen [PSA]: Secondary | ICD-10-CM | POA: Diagnosis not present

## 2016-03-26 DIAGNOSIS — R3914 Feeling of incomplete bladder emptying: Secondary | ICD-10-CM | POA: Diagnosis not present

## 2016-03-28 ENCOUNTER — Encounter: Payer: Self-pay | Admitting: Cardiovascular Disease

## 2016-03-30 ENCOUNTER — Encounter: Payer: Self-pay | Admitting: Internal Medicine

## 2016-03-31 ENCOUNTER — Encounter: Payer: Self-pay | Admitting: Cardiovascular Disease

## 2016-03-31 ENCOUNTER — Other Ambulatory Visit: Payer: Self-pay | Admitting: Internal Medicine

## 2016-03-31 MED ORDER — AMLODIPINE BESYLATE 5 MG PO TABS: 5.0000 mg | ORAL_TABLET | Freq: Every day | ORAL | 11 refills | Status: DC

## 2016-04-15 ENCOUNTER — Ambulatory Visit (INDEPENDENT_AMBULATORY_CARE_PROVIDER_SITE_OTHER): Payer: Medicare Other | Admitting: *Deleted

## 2016-04-15 DIAGNOSIS — I712 Thoracic aortic aneurysm, without rupture, unspecified: Secondary | ICD-10-CM

## 2016-04-15 DIAGNOSIS — Z5181 Encounter for therapeutic drug level monitoring: Secondary | ICD-10-CM

## 2016-04-15 DIAGNOSIS — I359 Nonrheumatic aortic valve disorder, unspecified: Secondary | ICD-10-CM | POA: Diagnosis not present

## 2016-04-15 LAB — POCT INR: INR: 2.6

## 2016-04-28 DIAGNOSIS — R972 Elevated prostate specific antigen [PSA]: Secondary | ICD-10-CM | POA: Diagnosis not present

## 2016-05-02 ENCOUNTER — Other Ambulatory Visit: Payer: Self-pay | Admitting: Cardiovascular Disease

## 2016-05-02 ENCOUNTER — Other Ambulatory Visit: Payer: Self-pay | Admitting: Internal Medicine

## 2016-05-05 ENCOUNTER — Ambulatory Visit (INDEPENDENT_AMBULATORY_CARE_PROVIDER_SITE_OTHER): Payer: Medicare Other

## 2016-05-05 DIAGNOSIS — I359 Nonrheumatic aortic valve disorder, unspecified: Secondary | ICD-10-CM

## 2016-05-05 DIAGNOSIS — I712 Thoracic aortic aneurysm, without rupture, unspecified: Secondary | ICD-10-CM

## 2016-05-05 DIAGNOSIS — R972 Elevated prostate specific antigen [PSA]: Secondary | ICD-10-CM | POA: Diagnosis not present

## 2016-05-05 DIAGNOSIS — R3912 Poor urinary stream: Secondary | ICD-10-CM | POA: Diagnosis not present

## 2016-05-05 DIAGNOSIS — N401 Enlarged prostate with lower urinary tract symptoms: Secondary | ICD-10-CM | POA: Diagnosis not present

## 2016-05-05 DIAGNOSIS — Z5181 Encounter for therapeutic drug level monitoring: Secondary | ICD-10-CM | POA: Diagnosis not present

## 2016-05-05 LAB — POCT INR: INR: 1.6

## 2016-05-05 MED ORDER — AMLODIPINE BESYLATE 5 MG PO TABS: 5.0000 mg | ORAL_TABLET | Freq: Every day | ORAL | 11 refills | Status: DC

## 2016-05-05 NOTE — Telephone Encounter (Signed)
Order Providers   Prescribing Provider Encounter Provider  Cassandria Anger, MD Cassandria Anger, MD  Medication Detail    Disp Refills Start End   amLODipine (NORVASC) 5 MG tablet 30 tablet 11 03/31/2016    Sig - Route: Take 1 tablet (5 mg total) by mouth daily. - Oral   E-Prescribing Status: Receipt confirmed by pharmacy (03/31/2016 5:43 PM EST)   Pharmacy   [facility] [place]

## 2016-05-07 ENCOUNTER — Other Ambulatory Visit: Payer: Self-pay | Admitting: Internal Medicine

## 2016-05-07 ENCOUNTER — Encounter: Payer: Self-pay | Admitting: Internal Medicine

## 2016-05-08 ENCOUNTER — Ambulatory Visit (INDEPENDENT_AMBULATORY_CARE_PROVIDER_SITE_OTHER): Payer: Medicare Other | Admitting: General Practice

## 2016-05-08 DIAGNOSIS — Z23 Encounter for immunization: Secondary | ICD-10-CM

## 2016-05-09 ENCOUNTER — Telehealth: Payer: Self-pay

## 2016-05-09 ENCOUNTER — Other Ambulatory Visit: Payer: Self-pay | Admitting: Internal Medicine

## 2016-05-09 MED ORDER — TADALAFIL 5 MG PO TABS: 5.0000 mg | ORAL_TABLET | Freq: Every day | ORAL | 5 refills | Status: DC

## 2016-05-09 NOTE — Telephone Encounter (Signed)
Rx Tadalafil 5 mg sent to [facility] per patient request and not [facility] @ fax number [phone]

## 2016-05-12 MED ORDER — TADALAFIL 5 MG PO TABS: 5.0000 mg | ORAL_TABLET | Freq: Every day | ORAL | 1 refills | Status: DC | PRN

## 2016-05-16 ENCOUNTER — Other Ambulatory Visit: Payer: Self-pay | Admitting: Internal Medicine

## 2016-05-16 MED ORDER — TADALAFIL 5 MG PO TABS: 5.0000 mg | ORAL_TABLET | Freq: Every day | ORAL | 6 refills | Status: DC | PRN

## 2016-05-19 ENCOUNTER — Telehealth: Payer: Self-pay | Admitting: Internal Medicine

## 2016-05-19 ENCOUNTER — Ambulatory Visit (INDEPENDENT_AMBULATORY_CARE_PROVIDER_SITE_OTHER): Payer: Medicare Other | Admitting: *Deleted

## 2016-05-19 DIAGNOSIS — I712 Thoracic aortic aneurysm, without rupture, unspecified: Secondary | ICD-10-CM

## 2016-05-19 DIAGNOSIS — Z5181 Encounter for therapeutic drug level monitoring: Secondary | ICD-10-CM | POA: Diagnosis not present

## 2016-05-19 DIAGNOSIS — I359 Nonrheumatic aortic valve disorder, unspecified: Secondary | ICD-10-CM | POA: Diagnosis not present

## 2016-05-19 LAB — POCT INR: INR: 2.3

## 2016-05-19 NOTE — Telephone Encounter (Signed)
Spoke with patient, he stated that he is not interested in scheduling awv. He stated that he will call office back to schedule a visit with [name].

## 2016-05-27 ENCOUNTER — Encounter: Payer: Self-pay | Admitting: Internal Medicine

## 2016-05-28 ENCOUNTER — Other Ambulatory Visit: Payer: Self-pay | Admitting: Internal Medicine

## 2016-05-28 MED ORDER — PANTOPRAZOLE SODIUM 40 MG PO TBEC: 40.0000 mg | DELAYED_RELEASE_TABLET | Freq: Every day | ORAL | 3 refills | Status: DC

## 2016-06-05 DIAGNOSIS — H43393 Other vitreous opacities, bilateral: Secondary | ICD-10-CM | POA: Diagnosis not present

## 2016-06-16 ENCOUNTER — Ambulatory Visit (INDEPENDENT_AMBULATORY_CARE_PROVIDER_SITE_OTHER): Payer: Medicare Other | Admitting: *Deleted

## 2016-06-16 DIAGNOSIS — Z5181 Encounter for therapeutic drug level monitoring: Secondary | ICD-10-CM

## 2016-06-16 DIAGNOSIS — I359 Nonrheumatic aortic valve disorder, unspecified: Secondary | ICD-10-CM

## 2016-06-16 DIAGNOSIS — I712 Thoracic aortic aneurysm, without rupture, unspecified: Secondary | ICD-10-CM

## 2016-06-16 LAB — POCT INR: INR: 3.3

## 2016-06-17 ENCOUNTER — Encounter: Payer: Self-pay | Admitting: Internal Medicine

## 2016-06-19 ENCOUNTER — Other Ambulatory Visit: Payer: Self-pay | Admitting: Internal Medicine

## 2016-06-19 NOTE — Telephone Encounter (Signed)
Last filled 06/2013, okay to fill?

## 2016-06-21 ENCOUNTER — Encounter: Payer: Self-pay | Admitting: Internal Medicine

## 2016-06-23 NOTE — Telephone Encounter (Signed)
Please advise 

## 2016-06-30 ENCOUNTER — Ambulatory Visit (HOSPITAL_COMMUNITY): Payer: Medicare Other | Attending: Cardiology

## 2016-06-30 ENCOUNTER — Other Ambulatory Visit: Payer: Self-pay

## 2016-06-30 DIAGNOSIS — I359 Nonrheumatic aortic valve disorder, unspecified: Secondary | ICD-10-CM | POA: Diagnosis not present

## 2016-06-30 DIAGNOSIS — Z952 Presence of prosthetic heart valve: Secondary | ICD-10-CM | POA: Diagnosis not present

## 2016-06-30 DIAGNOSIS — Z9889 Other specified postprocedural states: Secondary | ICD-10-CM | POA: Diagnosis not present

## 2016-07-01 ENCOUNTER — Encounter: Payer: Self-pay | Admitting: Internal Medicine

## 2016-07-02 ENCOUNTER — Telehealth: Payer: Self-pay | Admitting: Cardiovascular Disease

## 2016-07-02 NOTE — Telephone Encounter (Signed)
f/u Message  Pt returning RN call about echo. Please call back to discuss

## 2016-07-02 NOTE — Telephone Encounter (Signed)
I spoke with pt and reviewed echo results with him.  

## 2016-07-07 ENCOUNTER — Ambulatory Visit (INDEPENDENT_AMBULATORY_CARE_PROVIDER_SITE_OTHER): Payer: Medicare Other

## 2016-07-07 ENCOUNTER — Encounter: Payer: Self-pay | Admitting: Internal Medicine

## 2016-07-07 DIAGNOSIS — Z5181 Encounter for therapeutic drug level monitoring: Secondary | ICD-10-CM | POA: Diagnosis not present

## 2016-07-07 DIAGNOSIS — I359 Nonrheumatic aortic valve disorder, unspecified: Secondary | ICD-10-CM

## 2016-07-07 DIAGNOSIS — I712 Thoracic aortic aneurysm, without rupture, unspecified: Secondary | ICD-10-CM

## 2016-07-07 LAB — POCT INR: INR: 2.4

## 2016-07-07 MED ORDER — TADALAFIL 5 MG PO TABS: 5.0000 mg | ORAL_TABLET | Freq: Every day | ORAL | 0 refills | Status: DC | PRN

## 2016-07-07 NOTE — Telephone Encounter (Signed)
MD ok'd to refill Cialis see attached Printed script and faxed to [facility]aph[facility],  Please print/fax  Thx  ----- Message -----  From: Earnstine Regal, MA  Sent: 07/07/2016 10:59 AM  To: Cassandria Anger, MD  Subject: FW: Non-Urgent Medical Question

## 2016-07-14 DIAGNOSIS — L814 Other melanin hyperpigmentation: Secondary | ICD-10-CM | POA: Diagnosis not present

## 2016-07-14 DIAGNOSIS — L812 Freckles: Secondary | ICD-10-CM | POA: Diagnosis not present

## 2016-07-14 DIAGNOSIS — L820 Inflamed seborrheic keratosis: Secondary | ICD-10-CM | POA: Diagnosis not present

## 2016-07-14 DIAGNOSIS — L821 Other seborrheic keratosis: Secondary | ICD-10-CM | POA: Diagnosis not present

## 2016-07-14 DIAGNOSIS — Z85828 Personal history of other malignant neoplasm of skin: Secondary | ICD-10-CM | POA: Diagnosis not present

## 2016-07-14 DIAGNOSIS — L570 Actinic keratosis: Secondary | ICD-10-CM | POA: Diagnosis not present

## 2016-07-14 DIAGNOSIS — L720 Epidermal cyst: Secondary | ICD-10-CM | POA: Diagnosis not present

## 2016-08-04 ENCOUNTER — Ambulatory Visit (INDEPENDENT_AMBULATORY_CARE_PROVIDER_SITE_OTHER): Payer: Medicare Other

## 2016-08-04 ENCOUNTER — Other Ambulatory Visit: Payer: Self-pay | Admitting: Cardiovascular Disease

## 2016-08-04 DIAGNOSIS — I712 Thoracic aortic aneurysm, without rupture, unspecified: Secondary | ICD-10-CM

## 2016-08-04 DIAGNOSIS — Z5181 Encounter for therapeutic drug level monitoring: Secondary | ICD-10-CM | POA: Diagnosis not present

## 2016-08-04 DIAGNOSIS — I359 Nonrheumatic aortic valve disorder, unspecified: Secondary | ICD-10-CM

## 2016-08-04 LAB — POCT INR: INR: 2.3

## 2016-08-13 ENCOUNTER — Encounter: Payer: Self-pay | Admitting: Cardiovascular Disease

## 2016-08-19 ENCOUNTER — Encounter: Payer: Self-pay | Admitting: Internal Medicine

## 2016-08-19 ENCOUNTER — Ambulatory Visit (INDEPENDENT_AMBULATORY_CARE_PROVIDER_SITE_OTHER): Payer: Medicare Other | Admitting: Internal Medicine

## 2016-08-19 VITALS — BP 132/84 | HR 69 | Ht 72.0 in | Wt 175.0 lb

## 2016-08-19 DIAGNOSIS — I10 Essential (primary) hypertension: Secondary | ICD-10-CM

## 2016-08-19 DIAGNOSIS — R1031 Right lower quadrant pain: Secondary | ICD-10-CM

## 2016-08-19 DIAGNOSIS — R109 Unspecified abdominal pain: Secondary | ICD-10-CM | POA: Insufficient documentation

## 2016-08-19 MED ORDER — DOXYCYCLINE HYCLATE 100 MG PO TABS: 100.0000 mg | ORAL_TABLET | Freq: Two times a day (BID) | ORAL | 0 refills | Status: DC

## 2016-08-19 NOTE — Assessment & Plan Note (Signed)
stable overall by history and exam, recent data reviewed with pt, and pt to continue medical treatment as before,  to f/u any worsening symptoms or concerns BP Readings from Last 3 Encounters:  08/19/16 132/84  02/08/16 140/82  07/19/15 120/80

## 2016-08-19 NOTE — Patient Instructions (Signed)
Please take all new medication as prescribed - the antibiotic  Please continue all other medications as before, and refills have been done if requested.  Please have the pharmacy call with any other refills you may need.  Please keep your appointments with your specialists as you may have planned  You will be contacted regarding the referral for: CT scan for tomorrow  Please go to the LAB in the Basement (turn left off the elevator) for the tests to be done FIRST THING in the AM  You will be contacted by phone if any changes need to be made immediately.  Otherwise, you will receive a letter about your results with an explanation, but please check with MyChart first.  Please remember to sign up for MyChart if you have not done so, as this will be important to you in the future with finding out test results, communicating by private email, and scheduling acute appointments online when needed.

## 2016-08-19 NOTE — Assessment & Plan Note (Signed)
RLQ/pelvic in nature of unclear etiology, exam benign but has significant discomfort, have high suspicion for stone but cannot r/o lower grade prostatitis given recent biopsy; for doxy course asd, labs in AM including UA and cx, and CT abd/pelvis no CM to r/o stone or other signifcant abnormality
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
## 2016-08-20 ENCOUNTER — Other Ambulatory Visit (INDEPENDENT_AMBULATORY_CARE_PROVIDER_SITE_OTHER): Payer: Medicare Other

## 2016-08-20 DIAGNOSIS — I440 Atrioventricular block, first degree: Secondary | ICD-10-CM | POA: Diagnosis not present

## 2016-08-20 DIAGNOSIS — I10 Essential (primary) hypertension: Secondary | ICD-10-CM | POA: Diagnosis not present

## 2016-08-20 DIAGNOSIS — N401 Enlarged prostate with lower urinary tract symptoms: Secondary | ICD-10-CM | POA: Diagnosis not present

## 2016-08-20 DIAGNOSIS — R1031 Right lower quadrant pain: Secondary | ICD-10-CM

## 2016-08-20 DIAGNOSIS — R3914 Feeling of incomplete bladder emptying: Secondary | ICD-10-CM | POA: Diagnosis not present

## 2016-08-20 LAB — URINALYSIS, ROUTINE W REFLEX MICROSCOPIC
Bilirubin Urine: NEGATIVE
HGB URINE DIPSTICK: NEGATIVE
Ketones, ur: NEGATIVE
LEUKOCYTES UA: NEGATIVE
NITRITE: NEGATIVE
PH: 5.5 (ref 5.0–8.0)
Specific Gravity, Urine: >=1.03 — AB (ref 1.000–1.030)
TOTAL PROTEIN, URINE-UPE24: NEGATIVE
URINE GLUCOSE: NEGATIVE
Urobilinogen, UA: 0.2 (ref 0.0–1.0)
WBC, UA: NONE SEEN (ref 0–?)

## 2016-08-20 LAB — CBC WITH DIFFERENTIAL/PLATELET
BASOS ABS: 0 10*3/uL (ref 0.0–0.1)
Basophils Relative: 0.3 % (ref 0.0–3.0)
Eosinophils Absolute: 0.1 10*3/uL (ref 0.0–0.7)
Eosinophils Relative: 1.2 % (ref 0.0–5.0)
HCT: 42.8 % (ref 39.0–52.0)
Hemoglobin: 15 g/dL (ref 13.0–17.0)
LYMPHS ABS: 0.5 10*3/uL — AB (ref 0.7–4.0)
Lymphocytes Relative: 8.8 % — ABNORMAL LOW (ref 12.0–46.0)
MCHC: 35.1 g/dL (ref 30.0–36.0)
MCV: 96.7 fl (ref 78.0–100.0)
MONO ABS: 0.5 10*3/uL (ref 0.1–1.0)
Monocytes Relative: 8.8 % (ref 3.0–12.0)
NEUTROS ABS: 4.5 10*3/uL (ref 1.4–7.7)
Neutrophils Relative %: 80.9 % — ABNORMAL HIGH (ref 43.0–77.0)
PLATELETS: 118 10*3/uL — AB (ref 150.0–400.0)
RBC: 4.42 Mil/uL (ref 4.22–5.81)
RDW: 14.2 % (ref 11.5–15.5)
WBC: 5.6 10*3/uL (ref 4.0–10.5)

## 2016-08-20 LAB — BASIC METABOLIC PANEL
BUN: 16 mg/dL (ref 6–23)
CHLORIDE: 104 meq/L (ref 96–112)
CO2: 29 meq/L (ref 19–32)
Calcium: 9.8 mg/dL (ref 8.4–10.5)
Creatinine, Ser: 1.24 mg/dL (ref 0.40–1.50)
GFR: 60.55 mL/min (ref >60.00)
GLUCOSE: 128 mg/dL — AB (ref 70–99)
POTASSIUM: 4.2 meq/L (ref 3.5–5.1)
SODIUM: 137 meq/L (ref 135–145)

## 2016-08-20 LAB — HEMOGLOBIN A1C: Hgb A1c MFr Bld: 5.3 % (ref 4.6–6.5)

## 2016-08-20 LAB — HEPATIC FUNCTION PANEL
ALBUMIN: 4.1 g/dL (ref 3.5–5.2)
ALK PHOS: 76 U/L (ref 39–117)
ALT: 16 U/L (ref 0–53)
AST: 22 U/L (ref 0–37)
BILIRUBIN DIRECT: 0.1 mg/dL (ref 0.0–0.3)
TOTAL PROTEIN: 6 g/dL (ref 6.0–8.3)
Total Bilirubin: 0.8 mg/dL (ref 0.2–1.2)

## 2016-08-20 LAB — LIPASE: Lipase: 26 U/L (ref 11.0–59.0)

## 2016-08-21 ENCOUNTER — Ambulatory Visit (INDEPENDENT_AMBULATORY_CARE_PROVIDER_SITE_OTHER)
Admission: RE | Admit: 2016-08-21 | Discharge: 2016-08-21 | Disposition: A | Discharge status: Home / Self Care | Payer: Medicare Other | Source: Ambulatory Visit | Attending: Internal Medicine | Admitting: Internal Medicine

## 2016-08-21 DIAGNOSIS — K5730 Diverticulosis of large intestine without perforation or abscess without bleeding: Secondary | ICD-10-CM | POA: Diagnosis not present

## 2016-08-21 DIAGNOSIS — R1031 Right lower quadrant pain: Secondary | ICD-10-CM

## 2016-08-22 LAB — URINE CULTURE: Organism ID, Bacteria: NO GROWTH

## 2016-09-01 ENCOUNTER — Ambulatory Visit (INDEPENDENT_AMBULATORY_CARE_PROVIDER_SITE_OTHER): Payer: Medicare Other | Admitting: Cardiovascular Disease

## 2016-09-01 ENCOUNTER — Encounter: Payer: Self-pay | Admitting: Cardiovascular Disease

## 2016-09-01 ENCOUNTER — Ambulatory Visit (INDEPENDENT_AMBULATORY_CARE_PROVIDER_SITE_OTHER): Payer: Medicare Other

## 2016-09-01 VITALS — BP 110/64 | HR 56 | Ht 72.0 in | Wt 174.0 lb

## 2016-09-01 DIAGNOSIS — I712 Thoracic aortic aneurysm, without rupture, unspecified: Secondary | ICD-10-CM

## 2016-09-01 DIAGNOSIS — I359 Nonrheumatic aortic valve disorder, unspecified: Secondary | ICD-10-CM

## 2016-09-01 DIAGNOSIS — I714 Abdominal aortic aneurysm, without rupture, unspecified: Secondary | ICD-10-CM

## 2016-09-01 DIAGNOSIS — I10 Essential (primary) hypertension: Secondary | ICD-10-CM

## 2016-09-01 DIAGNOSIS — Z5181 Encounter for therapeutic drug level monitoring: Secondary | ICD-10-CM

## 2016-09-01 LAB — POCT INR: INR: 2.5
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
## 2016-09-01 NOTE — Patient Instructions (Addendum)
Medication Instructions:  Your physician recommends that you continue on your current medications as directed. Please refer to the Current Medication list given to you today.   Labwork: NONE ORDERED  Testing/Procedures: NONE ORDERED  Follow-Up: 1. Your physician wants you to follow-up in: [place] DR. Angelena Form You will receive a reminder letter in the mail two months in advance. If you don't receive a letter, please call our office to schedule the follow-up appointment.  2. YOU ARE BEING REFERRED TO [facility] TO SEE EITHER DR. Trula Slade OR DR. CAIN DX AAA; PER DR. MCALHANY  Any Other Special Instructions Will Be Listed Below (If Applicable).     If you need a refill on your cardiac medications before your next appointment, please call your pharmacy.

## 2016-09-05 ENCOUNTER — Encounter: Payer: Self-pay | Admitting: Internal Medicine

## 2016-09-15 ENCOUNTER — Other Ambulatory Visit: Payer: Self-pay

## 2016-09-15 ENCOUNTER — Ambulatory Visit: Payer: Medicare Other | Admitting: Internal Medicine

## 2016-09-16 ENCOUNTER — Encounter: Payer: Self-pay | Admitting: Vascular Surgery

## 2016-10-01 ENCOUNTER — Ambulatory Visit (INDEPENDENT_AMBULATORY_CARE_PROVIDER_SITE_OTHER): Payer: Medicare Other | Admitting: Vascular Surgery

## 2016-10-01 ENCOUNTER — Encounter: Payer: Self-pay | Admitting: Vascular Surgery

## 2016-10-01 DIAGNOSIS — I714 Abdominal aortic aneurysm, without rupture, unspecified: Secondary | ICD-10-CM

## 2016-10-01 NOTE — Progress Notes (Signed)
Patient name: Patrick [name] MRN: [id number] DOB: 1940/08/11 Sex: male   REASON FOR CONSULT:    Saccular abdominal aortic aneurysm. The the consult is requested by Dr. Darlina Guys.  HPI:   Patrick [name] is a pleasant 76 y.o. 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 I have reviewed the records that were sent with the patient. The patient was seen on 09/01/2016. 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 He is maintained on chronic Coumadin therapy.   He had a CT scan recently looking for a kidney stone and an incidental finding was a saccular abdominal aortic aneurysm. The patient denies any abdominal pain or back pain. Of note, no renal or ureteral calculi was identified.  He is unaware of any family history of aneurysmal disease.  Past Medical History:  Diagnosis Date  . Anticoagulant long-term use   . 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   Last echo 02/2009: EF 55-60%, Normal appearing "tissue valve" with trivial periprosthetic AR, mild MR, mild LAE  . BPH (benign prostatic hyperplasia)   . Bradycardia    Beta blocker discontinued  . Diverticulosis   . HTN (hypertension)   . Migraine headache   . S/P cardiac cath    [facility] in 2003 demonstrated normal coronary arteries    Family History  Problem Relation Age of Onset  . Cancer Mother        ovarian  . CAD Father   . Colon cancer Neg Hx     SOCIAL HISTORY: He quit tobacco in 1977. Social History   Social History  . Marital status: Married    Spouse name: N/A  . Number of children: 2  . Years of education: N/A   Occupational History  . [employer] Self Employed   Social History Main Topics  . Smoking status: Former Smoker    Packs/day: 1.00    Years: 10.00    Types: Cigarettes    Quit date:  08/08/1975  . Smokeless tobacco: Never Used  . Alcohol use 0.0 oz/week     Comment: ONLY DRINK ON FRIDAYS  . Drug use: No  . Sexual activity: Yes   Other Topics Concern  . Not on file   Social History Narrative  . No narrative on file    Allergies  Allergen Reactions  . Codeine Rash    rash    Current Outpatient Prescriptions  Medication Sig Dispense Refill  . amLODipine (NORVASC) 5 MG tablet Take 1 tablet (5 mg total) by mouth daily. 30 tablet 11  . amoxicillin (AMOXIL) 500 MG capsule take 1 capsule by mouth four times a day 20 capsule 3  . atorvastatin (LIPITOR) 10 MG tablet take 1/2 tablet by mouth once daily 30 tablet 2  . desonide (DESOWEN) 0.05 % cream Apply 1 application topically daily as needed. 60 g 3  . pantoprazole (PROTONIX) 40 MG tablet Take 1 tablet (40 mg total) by mouth daily. 90 tablet 3  . STAXYN 10 MG TBDP Take 10 mg by mouth daily as needed.   0  . SUMAtriptan (IMITREX) 50 MG tablet take 1/2 tablet by mouth every 2 hours if needed as directed 9 tablet 3  . tadalafil (CIALIS) 5 MG tablet Take 1 tablet (5 mg total) by mouth  daily as needed for erectile dysfunction. 90 tablet 0  . warfarin (COUMADIN) 7.5 MG tablet take 1 tablet by mouth once daily 90 tablet 1   No current facility-administered medications for this visit.     REVIEW OF SYSTEMS:  [X]  denotes positive finding, [ ]  denotes negative finding Cardiac  Comments:  Chest pain or chest pressure:    Shortness of breath upon exertion:    Short of breath when lying flat:    Irregular heart rhythm:        Vascular    Pain in calf, thigh, or hip brought on by ambulation:    Pain in feet at night that wakes you up from your sleep:     Blood clot in your veins:    Leg swelling:         Pulmonary    Oxygen at home:    Productive cough:     Wheezing:         Neurologic    Sudden weakness in arms or legs:     Sudden numbness in arms or legs:     Sudden onset of difficulty speaking or slurred  speech:    Temporary loss of vision in one eye:     Problems with dizziness:         Gastrointestinal    Blood in stool:     Vomited blood:         Genitourinary    Burning when urinating:     Blood in urine:        Psychiatric    Major depression:         Hematologic    Bleeding problems:    Problems with blood clotting too easily:        Skin    Rashes or ulcers:        Constitutional    Fever or chills:     PHYSICAL EXAM:   There were no vitals filed for this visit. Epic was down during his visit and his vitals are documented as follows. Blood pressure 143/84. Saturation 97%. Heart rate is 65. Temperature is 97.7.  GENERAL: The patient is a well-nourished male, in no acute distress. The vital signs are documented above. CARDIAC: There is a regular rate and rhythm.  VASCULAR: I do not detect carotid bruits. He has palpable femoral, popliteal, and pedal pulses bilaterally. PULMONARY: There is good air exchange bilaterally without wheezing or rales. ABDOMEN: Soft and non-tender with normal pitched bowel sounds. I cannot palpate his aneurysm. MUSCULOSKELETAL: There are no major deformities or cyanosis. NEUROLOGIC: No focal weakness or paresthesias are detected. SKIN: There are no ulcers or rashes noted. PSYCHIATRIC: The patient has a normal affect.  DATA:    CT ANGIOGRAM ABDOMEN PELVIS: I have reviewed his CT angiogram which shows a focal saccular aneurysm in the mid infrarenal aorta on the left lateral side. The aorta above and below this is not dilated.  ECHO: I have reviewed his echo that was done on 06/30/2016. This showed an ejection fraction of 60-65%. He had normal LV function.  MEDICAL ISSUES:   SACCULAR INFRARENAL ABDOMINAL AORTIC ANEURYSM: Although the patient's aneurysm is very small, this is a focal saccular aneurysm which I think needs to be addressed given the risk of rupture. I think he could likely be addressed with an endovascular graft using one piece.  I will need to discuss preoperative cardiac clearance and a Coumadin bridge with Dr. Angelena Form. The patient has Atripla and and would  not like to schedule his surgery until after September 13. Although ideally be nice to have this fixed before then he states that this trip cannot be canceled and he understands the risks involved. We have reviewed the indications for repair and the potential complications. We'll hopefully get his surgery scheduled in early September.  Deitra Mayo [facility] [phone]

## 2016-10-02 ENCOUNTER — Telehealth: Payer: Self-pay | Admitting: *Deleted

## 2016-10-02 NOTE — Telephone Encounter (Signed)
-----   Message from Angelia Mould, MD sent at 10/02/2016  9:29 AM EDT ----- Regarding: FW: AAA No cardiac clearance and stop Coumadin 5 days prior to EVAR See below. Thanks Gerald Stabs  ----- Message ----- From: Burnell Blanks, MD Sent: 10/02/2016   8:38 AM To: Angelia Mould, MD Subject: RE: AAA                                        Gerald Stabs, I just saw him in July and he was doing well. He does not need stress testing. He has no prior stroke so we do not need to bridge coumadin for his procedure with his mechanical aortic valve. If he had atrial fib, prior stroke or mechanical mitral valve then we would brdige.  He can hold coumadin 5 days before your planned procedure. Do you need me to put this in a letter for the chart? Gerald Stabs  ----- Message ----- From: Angelia Mould, MD Sent: 10/01/2016   6:59 PM To: Burnell Blanks, MD Subject: AAA                                            Gerald Stabs,   This patient has a saccular aneurysm which puts it at increased risk for rupture despite its small size. I think this can likely be addressed with an endovascular graft fairly simply. He does have a significant cardiac history and has had previous aortic valve replacement.   Does he need preoperative cardiac clearance?  What we do recommend with respect to a bridge for his Coumadin?  Thanks, Gerald Stabs

## 2016-10-02 NOTE — Telephone Encounter (Signed)
Coumadin instruction as per Dr. Angelena Form;  Cleared from cardiac standpoint.

## 2016-10-09 ENCOUNTER — Other Ambulatory Visit: Payer: Self-pay | Admitting: *Deleted

## 2016-10-13 ENCOUNTER — Ambulatory Visit (INDEPENDENT_AMBULATORY_CARE_PROVIDER_SITE_OTHER): Payer: Medicare Other | Admitting: *Deleted

## 2016-10-13 DIAGNOSIS — I712 Thoracic aortic aneurysm, without rupture, unspecified: Secondary | ICD-10-CM

## 2016-10-13 DIAGNOSIS — I359 Nonrheumatic aortic valve disorder, unspecified: Secondary | ICD-10-CM

## 2016-10-13 DIAGNOSIS — Z5181 Encounter for therapeutic drug level monitoring: Secondary | ICD-10-CM | POA: Diagnosis not present

## 2016-10-13 LAB — POCT INR: INR: 3.2

## 2016-10-13 NOTE — Patient Instructions (Addendum)
Last dose on 11/07/16 for procedure on 11/13/16.   Resume Coumadin after Surgery at normal dose.   Call with any questions [phone]

## 2016-11-07 ENCOUNTER — Ambulatory Visit (INDEPENDENT_AMBULATORY_CARE_PROVIDER_SITE_OTHER): Payer: Medicare Other | Admitting: Internal Medicine

## 2016-11-07 ENCOUNTER — Ambulatory Visit (INDEPENDENT_AMBULATORY_CARE_PROVIDER_SITE_OTHER): Payer: Medicare Other | Admitting: Pharmacist

## 2016-11-07 ENCOUNTER — Encounter: Payer: Self-pay | Admitting: Internal Medicine

## 2016-11-07 ENCOUNTER — Other Ambulatory Visit (INDEPENDENT_AMBULATORY_CARE_PROVIDER_SITE_OTHER): Payer: Medicare Other

## 2016-11-07 VITALS — BP 154/82 | HR 49 | Temp 98.0°F | Ht 72.0 in | Wt 174.0 lb

## 2016-11-07 DIAGNOSIS — R55 Syncope and collapse: Secondary | ICD-10-CM

## 2016-11-07 DIAGNOSIS — N320 Bladder-neck obstruction: Secondary | ICD-10-CM | POA: Diagnosis not present

## 2016-11-07 DIAGNOSIS — E785 Hyperlipidemia, unspecified: Secondary | ICD-10-CM

## 2016-11-07 DIAGNOSIS — R21 Rash and other nonspecific skin eruption: Secondary | ICD-10-CM

## 2016-11-07 DIAGNOSIS — Z5181 Encounter for therapeutic drug level monitoring: Secondary | ICD-10-CM

## 2016-11-07 DIAGNOSIS — H538 Other visual disturbances: Secondary | ICD-10-CM | POA: Diagnosis not present

## 2016-11-07 DIAGNOSIS — Z0000 Encounter for general adult medical examination without abnormal findings: Secondary | ICD-10-CM | POA: Diagnosis not present

## 2016-11-07 LAB — CBC WITH DIFFERENTIAL/PLATELET
BASOS PCT: 0.3 % (ref 0.0–3.0)
Basophils Absolute: 0 10*3/uL (ref 0.0–0.1)
EOS PCT: 1.4 % (ref 0.0–5.0)
Eosinophils Absolute: 0.1 10*3/uL (ref 0.0–0.7)
HCT: 44.2 % (ref 39.0–52.0)
Hemoglobin: 15.4 g/dL (ref 13.0–17.0)
LYMPHS ABS: 0.6 10*3/uL — AB (ref 0.7–4.0)
Lymphocytes Relative: 16.6 % (ref 12.0–46.0)
MCHC: 34.9 g/dL (ref 30.0–36.0)
MCV: 97.6 fl (ref 78.0–100.0)
MONO ABS: 0.3 10*3/uL (ref 0.1–1.0)
Monocytes Relative: 9.3 % (ref 3.0–12.0)
NEUTROS ABS: 2.6 10*3/uL (ref 1.4–7.7)
NEUTROS PCT: 72.4 % (ref 43.0–77.0)
PLATELETS: 121 10*3/uL — AB (ref 150.0–400.0)
RBC: 4.53 Mil/uL (ref 4.22–5.81)
RDW: 13.1 % (ref 11.5–15.5)
WBC: 3.6 10*3/uL — ABNORMAL LOW (ref 4.0–10.5)

## 2016-11-07 LAB — BASIC METABOLIC PANEL
BUN: 16 mg/dL (ref 6–23)
CHLORIDE: 104 meq/L (ref 96–112)
CO2: 31 mEq/L (ref 19–32)
CREATININE: 1.14 mg/dL (ref 0.40–1.50)
Calcium: 9.7 mg/dL (ref 8.4–10.5)
GFR: 66.68 mL/min (ref >60.00)
Glucose, Bld: 92 mg/dL (ref 70–99)
Potassium: 4.7 mEq/L (ref 3.5–5.1)
Sodium: 140 mEq/L (ref 135–145)

## 2016-11-07 LAB — HEPATIC FUNCTION PANEL
ALT: 16 U/L (ref 0–53)
AST: 18 U/L (ref 0–37)
Albumin: 4.3 g/dL (ref 3.5–5.2)
Alkaline Phosphatase: 83 U/L (ref 39–117)
BILIRUBIN TOTAL: 0.7 mg/dL (ref 0.2–1.2)
Bilirubin, Direct: 0.1 mg/dL (ref 0.0–0.3)
Total Protein: 6.2 g/dL (ref 6.0–8.3)

## 2016-11-07 LAB — URINALYSIS
BILIRUBIN URINE: NEGATIVE
HGB URINE DIPSTICK: NEGATIVE
KETONES UR: NEGATIVE
Leukocytes, UA: NEGATIVE
Nitrite: NEGATIVE
Specific Gravity, Urine: 1.015 (ref 1.000–1.030)
TOTAL PROTEIN, URINE-UPE24: NEGATIVE
URINE GLUCOSE: NEGATIVE
UROBILINOGEN UA: 0.2 (ref 0.0–1.0)
pH: 7 (ref 5.0–8.0)

## 2016-11-07 LAB — PROTIME-INR
INR: 4.2 ratio — AB (ref 0.8–1.0)
Prothrombin Time: 45.2 s — ABNORMAL HIGH (ref 9.6–13.1)

## 2016-11-07 LAB — LIPID PANEL
CHOL/HDL RATIO: 2
Cholesterol: 151 mg/dL (ref 0–200)
HDL: 63.7 mg/dL (ref >39.00)
LDL Cholesterol: 69 mg/dL (ref 0–99)
NONHDL: 87.32
TRIGLYCERIDES: 91 mg/dL (ref 0.0–149.0)
VLDL: 18.2 mg/dL (ref 0.0–40.0)

## 2016-11-07 LAB — TSH: TSH: 0.8 u[IU]/mL (ref 0.35–4.50)

## 2016-11-07 LAB — PSA: PSA: 8.48 ng/mL — ABNORMAL HIGH (ref 0.10–4.00)

## 2016-11-07 MED ORDER — KETOCONAZOLE 2 % EX CREA: 1.0000 "application " | TOPICAL_CREAM | Freq: Two times a day (BID) | CUTANEOUS | 1 refills | Status: DC

## 2016-11-07 MED ORDER — ATORVASTATIN CALCIUM 10 MG PO TABS: 10.0000 mg | ORAL_TABLET | Freq: Every day | ORAL | 11 refills | Status: DC

## 2016-11-07 NOTE — Assessment & Plan Note (Signed)

## 2016-11-07 NOTE — Progress Notes (Signed)
Subjective:  Patient ID: Patrick [name], male    DOB: 20-Oct-1940  Age: 76 y.o. MRN: [id number]  CC: No chief complaint on file.   HPI Patrick [name] presents for dyslipidemia, HTN, migraines f/u. Needs a [facility] well exam C/o weak spells in [place] last week - almost passed out x2 in two days. He felt very weak and his vision was distorted x 30 seconds each time...  Surgery is pending on 9/20. Patrick [name] is supposed to hold his coumadin starting today:   "SACCULAR INFRARENAL ABDOMINAL AORTIC ANEURYSM: Although the patient's aneurysm is very small, this is a focal saccular aneurysm which I think needs to be addressed given the risk of rupture. I think he could likely be addressed with an endovascular graft using one piece. I will need to discuss preoperative cardiac clearance and a Coumadin bridge with Dr. Angelena Form. The patient has Atripla and and would not like to schedule his surgery until after September 13. Although ideally be nice to have this fixed before then he states that this trip cannot be canceled and he understands the risks involved. We have reviewed the indications for repair and the potential complications. We'll hopefully get his surgery scheduled in early September.  Patrick [name] [facility] [phone]"  Outpatient Medications Prior to Visit  Medication Sig Dispense Refill  . amLODipine (NORVASC) 5 MG tablet Take 1 tablet (5 mg total) by mouth daily. 30 tablet 11  . amoxicillin (AMOXIL) 500 MG capsule take 1 capsule by mouth four times a day 20 capsule 3  . atorvastatin (LIPITOR) 10 MG tablet take 1/2 tablet by mouth once daily 30 tablet 2  . desonide (DESOWEN) 0.05 % cream Apply 1 application topically daily as needed. 60 g 3  . pantoprazole (PROTONIX) 40 MG tablet Take 1 tablet (40 mg total) by mouth daily. 90 tablet 3  . STAXYN 10 MG TBDP Take 10 mg by mouth daily as needed.   0  . SUMAtriptan (IMITREX) 50 MG tablet take 1/2  tablet by mouth every 2 hours if needed as directed 9 tablet 3  . tadalafil (CIALIS) 5 MG tablet Take 1 tablet (5 mg total) by mouth daily as needed for erectile dysfunction. 90 tablet 0  . warfarin (COUMADIN) 7.5 MG tablet take 1 tablet by mouth once daily 90 tablet 1   No facility-administered medications prior to visit.     ROS Review of Systems  Constitutional: Negative for appetite change, fatigue and unexpected weight change.  HENT: Negative for congestion, nosebleeds, sneezing, sore throat and trouble swallowing.   Eyes: Negative for itching and visual disturbance.  Respiratory: Negative for cough.   Cardiovascular: Negative for chest pain, palpitations and leg swelling.  Gastrointestinal: Negative for abdominal distention, blood in stool, diarrhea and nausea.  Genitourinary: Negative for frequency and hematuria.  Musculoskeletal: Negative for back pain, gait problem, joint swelling and neck pain.  Skin: Negative for rash.  Neurological: Positive for syncope. Negative for dizziness, tremors, speech difficulty and weakness.  Psychiatric/Behavioral: Negative for agitation, dysphoric mood and sleep disturbance. The patient is not nervous/anxious.     Objective:  BP (!) 154/82 (BP Location: Left Arm, Patient Position: Sitting, Cuff Size: Normal)   Pulse (!) 49   Temp 98 F (36.7 C) (Oral)   Ht 6' (1.829 m)   Wt 174 lb (78.9 kg)   SpO2 98%   BMI 23.60 kg/m   BP Readings from Last 3 Encounters:  11/07/16 (!) 154/82  09/01/16 110/64  08/19/16 132/84    Wt Readings from Last 3 Encounters:  11/07/16 174 lb (78.9 kg)  09/01/16 174 lb (78.9 kg)  08/19/16 175 lb (79.4 kg)    Physical Exam  Constitutional: He is oriented to person, place, and time. He appears well-developed. No distress.  NAD  HENT:  Mouth/Throat: Oropharynx is clear and moist.  Eyes: Pupils are equal, round, and reactive to light. Conjunctivae are normal.  Neck: Normal range of motion. No JVD present. No  thyromegaly present.  Cardiovascular: Normal rate, regular rhythm, normal heart sounds and intact distal pulses.  Exam reveals no gallop and no friction rub.   No murmur heard. Pulmonary/Chest: Effort normal and breath sounds normal. No respiratory distress. He has no wheezes. He has no rales. He exhibits no tenderness.  Abdominal: Soft. Bowel sounds are normal. He exhibits no distension and no mass. There is no tenderness. There is no rebound and no guarding.  Musculoskeletal: Normal range of motion. He exhibits no edema or tenderness.  Lymphadenopathy:    He has no cervical adenopathy.  Neurological: He is alert and oriented to person, place, and time. He has normal reflexes. No cranial nerve deficit. He exhibits normal muscle tone. He displays a negative Romberg sign. Coordination and gait normal.  Skin: Skin is warm and dry. No rash noted.  Psychiatric: He has a normal mood and affect. His behavior is normal. Judgment and thought content normal.  rash on L ankle Heart click is crisp  Procedure: EKG Indication: near-syncope Impression: NSR. No acute changes.   Lab Results  Component Value Date   WBC 5.6 08/20/2016   HGB 15.0 08/20/2016   HCT 42.8 08/20/2016   PLT 118.0 (L) 08/20/2016   GLUCOSE 128 (H) 08/20/2016   CHOL 178 01/31/2015   TRIG 153.0 (H) 01/31/2015   HDL 47.30 01/31/2015   LDLCALC 100 (H) 01/31/2015   ALT 16 08/20/2016   AST 22 08/20/2016   NA 137 08/20/2016   K 4.2 08/20/2016   CL 104 08/20/2016   CREATININE 1.24 08/20/2016   BUN 16 08/20/2016   CO2 29 08/20/2016   TSH 0.61 01/31/2015   PSA 7.89 (H) 02/27/2015   INR 3.2 10/13/2016   HGBA1C 5.3 08/20/2016    Ct Renal Stone Study  Result Date: 08/21/2016 CLINICAL DATA:  Persistent right lower quadrant tenderness over the last 6 weeks, history of kidney stones EXAM: CT ABDOMEN AND PELVIS WITHOUT CONTRAST TECHNIQUE: Multidetector CT imaging of the abdomen and pelvis was performed following the standard  protocol without IV contrast. COMPARISON:  CT urogram 03/10/2006 FINDINGS: Lower chest: The lung bases are clear. The heart is mildly enlarged. Median sternotomy sutures are present. Hepatobiliary: Well-circumscribed low-attenuation structures scattered throughout the liver consistent with hepatic cysts several of which have developed in the interval. No suspicious hepatic abnormality is seen. The gallbladder is contracted and no calcified gallstones are seen. Pancreas: The pancreas is normal in size and the pancreatic duct is not dilated. Spleen: The spleen is unremarkable. Adrenals/Urinary Tract: The adrenal glands appear normal. Bilateral renal cysts have enlarged since the prior CT of 2008. The largest cyst emanates from the lateral right kidney measuring 8.9 x 6.4 cm. A cyst in the mid left kidney measures 4.9 x 5.0 cm. No hydronephrosis is seen. No renal calculi are seen. The ureters are normal in caliber. The urinary bladder is not well distended and appears slightly thick-walled possibly due to a degree of bladder outlet obstruction. Stomach/Bowel: The stomach is  moderately distended with fluid and food debris. No small bowel distention is seen. Multiple rectosigmoid colon diverticula are present. No diverticulitis is seen. No other abnormality of the colon is seen. The terminal ileum and the appendix are moderately well visualized with no abnormality noted. Vascular/Lymphatic: Mild to moderate abdominal aortic atherosclerosis is present. There is a saccular aneurysm of the immediately infrarenal abdominal aorta on the left measuring 1.1 x 1.6 cm on coronal image. No adenopathy is seen. Reproductive: The prostate is significantly enlarged measuring 6.2 x 5.4 cm. This may result in a degree of bladder outlet obstruction. Other: None. Musculoskeletal: There is diffuse degenerative disc disease throughout the lumbar spine. A sclerotic lesion is noted within the left anterior acetabulum most likely benign. This  focus of sclerosis was present in 2008 and is slightly more prominent currently. IMPRESSION: 1. No renal or ureteral calculi are seen. No hydronephrosis is noted. 2. Significant enlargement of the prostate may result in a degree of bladder outlet obstruction. 3. Multiple rectosigmoid colon diverticula but no diverticulitis. 4. Mild to moderate abdominal aortic atherosclerosis. 5. Saccular aneurysm from the immediate infrarenal abdominal aorta extending to the left measuring 1.1 x 1.6 cm. Electronically Signed   By: Ivar Drape M.D.   On: 08/21/2016 11:37    Assessment & Plan:   There are no diagnoses linked to this encounter. I am having Mr. Cordaro maintain his desonide, STAXYN, amoxicillin, atorvastatin, amLODipine, pantoprazole, SUMAtriptan, tadalafil, and warfarin.  No orders of the defined types were placed in this encounter.    Follow-up: No Follow-up on file.  Walker Kehr, MD

## 2016-11-07 NOTE — Assessment & Plan Note (Addendum)
?  TIA vs other  Go to ER if re-occurred   EKG Labs Cardiol ref Carotid doppler US Head CT Do not stop coumadin - we will need postpone surgery w/Dr Scot Dock on 11/13/16 I spoke w/Dr Linard Millers, cardiology

## 2016-11-07 NOTE — Assessment & Plan Note (Signed)
L ankle ?ringworm vs other Rx ketoconazole cream bid

## 2016-11-07 NOTE — Assessment & Plan Note (Signed)
Go to ER if re-occurred   EKG Labs Cardiol ref Carotid doppler US Head CT Do not stop coumadin - we will need postpone surgery w/Dr Scot Dock on 11/13/16 I spoke w/Dr Linard Millers, cardiology

## 2016-11-10 ENCOUNTER — Telehealth: Payer: Self-pay | Admitting: Cardiovascular Disease

## 2016-11-10 ENCOUNTER — Encounter: Payer: Self-pay | Admitting: Internal Medicine

## 2016-11-10 DIAGNOSIS — R972 Elevated prostate specific antigen [PSA]: Secondary | ICD-10-CM | POA: Diagnosis not present

## 2016-11-10 NOTE — Telephone Encounter (Signed)
I spoke with pt and scheduled him to see Dr. Angelena Form on 11/13/16 at 10:20

## 2016-11-10 NOTE — Telephone Encounter (Signed)
Can we add him onto my schedule Thursday at 7:40 or 10:20? Thanks, chris

## 2016-11-10 NOTE — Telephone Encounter (Signed)
New message   Outgoing call to patient due to epic referral -   Patient stated he wanted to be seen by MD not the APP.  Patient aware that MD nurse will be calling him back .

## 2016-11-11 ENCOUNTER — Other Ambulatory Visit: Payer: Self-pay

## 2016-11-11 ENCOUNTER — Ambulatory Visit (INDEPENDENT_AMBULATORY_CARE_PROVIDER_SITE_OTHER)
Admission: RE | Admit: 2016-11-11 | Discharge: 2016-11-11 | Disposition: A | Discharge status: Home / Self Care | Payer: Medicare Other | Source: Ambulatory Visit | Attending: Internal Medicine | Admitting: Internal Medicine

## 2016-11-11 DIAGNOSIS — R55 Syncope and collapse: Secondary | ICD-10-CM | POA: Diagnosis not present

## 2016-11-11 DIAGNOSIS — H538 Other visual disturbances: Secondary | ICD-10-CM

## 2016-11-12 ENCOUNTER — Ambulatory Visit (HOSPITAL_COMMUNITY)
Admission: RE | Admit: 2016-11-12 | Discharge: 2016-11-12 | Disposition: A | Discharge status: Home / Self Care | Payer: Medicare Other | Source: Ambulatory Visit | Attending: Cardiovascular Disease | Admitting: Cardiovascular Disease

## 2016-11-12 DIAGNOSIS — I10 Essential (primary) hypertension: Secondary | ICD-10-CM | POA: Diagnosis not present

## 2016-11-12 DIAGNOSIS — H538 Other visual disturbances: Secondary | ICD-10-CM | POA: Diagnosis not present

## 2016-11-12 DIAGNOSIS — R55 Syncope and collapse: Secondary | ICD-10-CM | POA: Insufficient documentation

## 2016-11-12 DIAGNOSIS — Z87891 Personal history of nicotine dependence: Secondary | ICD-10-CM | POA: Diagnosis not present

## 2016-11-13 ENCOUNTER — Inpatient Hospital Stay (HOSPITAL_COMMUNITY): Admission: RE | Admit: 2016-11-13 | Payer: Medicare Other | Source: Ambulatory Visit | Admitting: Vascular Surgery

## 2016-11-13 ENCOUNTER — Encounter: Payer: Self-pay | Admitting: Cardiovascular Disease

## 2016-11-13 ENCOUNTER — Encounter (HOSPITAL_COMMUNITY): Admission: RE | Payer: Self-pay | Source: Ambulatory Visit

## 2016-11-13 ENCOUNTER — Ambulatory Visit (INDEPENDENT_AMBULATORY_CARE_PROVIDER_SITE_OTHER): Payer: Medicare Other | Admitting: Cardiovascular Disease

## 2016-11-13 VITALS — BP 128/82 | HR 62 | Ht 72.0 in | Wt 174.0 lb

## 2016-11-13 DIAGNOSIS — I359 Nonrheumatic aortic valve disorder, unspecified: Secondary | ICD-10-CM | POA: Diagnosis not present

## 2016-11-13 DIAGNOSIS — I714 Abdominal aortic aneurysm, without rupture, unspecified: Secondary | ICD-10-CM

## 2016-11-13 DIAGNOSIS — I712 Thoracic aortic aneurysm, without rupture, unspecified: Secondary | ICD-10-CM

## 2016-11-13 DIAGNOSIS — R42 Dizziness and giddiness: Secondary | ICD-10-CM

## 2016-11-13 DIAGNOSIS — I10 Essential (primary) hypertension: Secondary | ICD-10-CM

## 2016-11-13 SURGERY — INSERTION, ENDOVASCULAR STENT GRAFT, AORTA, ABDOMINAL
Anesthesia: General
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
## 2016-11-13 NOTE — Patient Instructions (Signed)
Medication Instructions:  Your physician recommends that you continue on your current medications as directed. Please refer to the Current Medication list given to you today.   Labwork: none  Testing/Procedures: none  Follow-Up: Your physician recommends that you schedule a follow-up appointment in: 12 months. Please call our office in June 2019 to schedule this appointment.     Any Other Special Instructions Will Be Listed Below (If Applicable).     If you need a refill on your cardiac medications before your next appointment, please call your pharmacy.

## 2016-11-17 ENCOUNTER — Other Ambulatory Visit: Payer: Self-pay | Admitting: *Deleted

## 2016-11-17 ENCOUNTER — Ambulatory Visit (INDEPENDENT_AMBULATORY_CARE_PROVIDER_SITE_OTHER): Payer: Medicare Other | Admitting: *Deleted

## 2016-11-17 DIAGNOSIS — Z5181 Encounter for therapeutic drug level monitoring: Secondary | ICD-10-CM | POA: Diagnosis not present

## 2016-11-17 DIAGNOSIS — N401 Enlarged prostate with lower urinary tract symptoms: Secondary | ICD-10-CM | POA: Diagnosis not present

## 2016-11-17 DIAGNOSIS — I359 Nonrheumatic aortic valve disorder, unspecified: Secondary | ICD-10-CM | POA: Diagnosis not present

## 2016-11-17 DIAGNOSIS — I712 Thoracic aortic aneurysm, without rupture, unspecified: Secondary | ICD-10-CM

## 2016-11-17 DIAGNOSIS — R3912 Poor urinary stream: Secondary | ICD-10-CM | POA: Diagnosis not present

## 2016-11-17 DIAGNOSIS — R972 Elevated prostate specific antigen [PSA]: Secondary | ICD-10-CM | POA: Diagnosis not present

## 2016-11-17 LAB — POCT INR: INR: 2

## 2016-11-21 NOTE — Pre-Procedure Instructions (Signed)
Patrick [name]  11/21/2016      [id number], [place] [address] Phone: [phone] Fax: [phone]    Your procedure is scheduled on November 28, 2016.  Report to [facility] Admitting at 530 AM.  Call this number if you have problems the morning of surgery:  [phone]   Remember:  Do not eat food or drink liquids after midnight.  Take these medicines the morning of surgery with A SIP OF WATER amlodipine (norvasc), pantoprazole (protonix).  Stop coumadin as instructed by your surgeon  7 days prior to surgery STOP taking any Aspirin, Aleve, Naproxen, Ibuprofen, Motrin, Advil, Goody's, BC's, all herbal medications, fish oil, and all vitamins   Do not wear jewelry, make-up or nail polish.  Do not wear lotions, powders, or perfumes, or deoderant.  Do not shave 48 hours prior to surgery.  Men may shave face and neck.  Do not bring valuables to the hospital.  [facility] is not responsible for any belongings or valuables.  Contacts, dentures or bridgework may not be worn into surgery.  Leave your suitcase in the car.  After surgery it may be brought to your room.  For patients admitted to the hospital, discharge time will be determined by your treatment team.  Patients discharged the day of surgery will not be allowed to drive home.   Special instructions:   [place]- Preparing For Surgery  Before surgery, you can play an important role. Because skin is not sterile, your skin needs to be as free of germs as possible. You can reduce the number of germs on your skin by washing with CHG (chlorahexidine gluconate) Soap before surgery.  CHG is an antiseptic cleaner which kills germs and bonds with the skin to continue killing germs even after washing.  Please do not use if you have an allergy to CHG or antibacterial soaps. If your skin becomes reddened/irritated stop using the CHG.   Do not shave (including legs and underarms) for at least 48 hours prior to first CHG shower. It is OK to shave your face.  Please follow these instructions carefully.   1. Shower the NIGHT BEFORE SURGERY and the MORNING OF SURGERY with CHG.   2. If you chose to wash your hair, wash your hair first as usual with your normal shampoo.  3. After you shampoo, rinse your hair and body thoroughly to remove the shampoo.  4. Use CHG as you would any other liquid soap. You can apply CHG directly to the skin and wash gently with a scrungie or a clean washcloth.   5. Apply the CHG Soap to your body ONLY FROM THE NECK DOWN.  Do not use on open wounds or open sores. Avoid contact with your eyes, ears, mouth and genitals (private parts). Wash genitals (private parts) with your normal soap.  6. Wash thoroughly, paying special attention to the area where your surgery will be performed.  7. Thoroughly rinse your body with warm water from the neck down.  8. DO NOT shower/wash with your normal soap after using and rinsing off the CHG Soap.  9. Pat yourself dry with a CLEAN TOWEL.   10. Wear CLEAN PAJAMAS   11. Place CLEAN SHEETS on your bed the night of your first shower and DO NOT SLEEP WITH PETS.    Day of Surgery: Do not apply any deodorants/lotions. Please wear clean clothes to the hospital/surgery  center.     Please read over the following fact sheets that you were given. Pain Booklet, Coughing and Deep Breathing, MRSA Information and Surgical Site Infection Prevention

## 2016-11-24 ENCOUNTER — Ambulatory Visit (INDEPENDENT_AMBULATORY_CARE_PROVIDER_SITE_OTHER): Payer: Medicare Other

## 2016-11-24 ENCOUNTER — Encounter (HOSPITAL_COMMUNITY)
Admission: RE | Admit: 2016-11-24 | Discharge: 2016-11-24 | Disposition: A | Discharge status: Home / Self Care | Payer: Medicare Other | Source: Ambulatory Visit | Attending: Vascular Surgery | Admitting: Vascular Surgery

## 2016-11-24 ENCOUNTER — Encounter (HOSPITAL_COMMUNITY): Payer: Self-pay

## 2016-11-24 DIAGNOSIS — Z01812 Encounter for preprocedural laboratory examination: Secondary | ICD-10-CM | POA: Insufficient documentation

## 2016-11-24 DIAGNOSIS — I10 Essential (primary) hypertension: Secondary | ICD-10-CM | POA: Diagnosis not present

## 2016-11-24 DIAGNOSIS — Z87891 Personal history of nicotine dependence: Secondary | ICD-10-CM | POA: Diagnosis not present

## 2016-11-24 DIAGNOSIS — Z01818 Encounter for other preprocedural examination: Secondary | ICD-10-CM | POA: Insufficient documentation

## 2016-11-24 DIAGNOSIS — Z0183 Encounter for blood typing: Secondary | ICD-10-CM | POA: Diagnosis not present

## 2016-11-24 DIAGNOSIS — Z7901 Long term (current) use of anticoagulants: Secondary | ICD-10-CM | POA: Diagnosis not present

## 2016-11-24 DIAGNOSIS — Z952 Presence of prosthetic heart valve: Secondary | ICD-10-CM | POA: Insufficient documentation

## 2016-11-24 DIAGNOSIS — Z23 Encounter for immunization: Secondary | ICD-10-CM | POA: Diagnosis not present

## 2016-11-24 DIAGNOSIS — Z79899 Other long term (current) drug therapy: Secondary | ICD-10-CM | POA: Diagnosis not present

## 2016-11-24 LAB — COMPREHENSIVE METABOLIC PANEL
ALBUMIN: 3.9 g/dL (ref 3.5–5.0)
ALK PHOS: 69 U/L (ref 38–126)
ALT: 20 U/L (ref 17–63)
ANION GAP: 7 (ref 5–15)
AST: 23 U/L (ref 15–41)
BUN: 18 mg/dL (ref 6–20)
CALCIUM: 9.1 mg/dL (ref 8.9–10.3)
CO2: 21 mmol/L — AB (ref 22–32)
Chloride: 109 mmol/L (ref 101–111)
Creatinine, Ser: 1.3 mg/dL — ABNORMAL HIGH (ref 0.61–1.24)
GFR calc Af Amer: >60 mL/min (ref >60)
GFR calc non Af Amer: 52 mL/min — ABNORMAL LOW (ref >60)
GLUCOSE: 102 mg/dL — AB (ref 65–99)
Potassium: 4.4 mmol/L (ref 3.5–5.1)
SODIUM: 137 mmol/L (ref 135–145)
Total Bilirubin: 1.3 mg/dL — ABNORMAL HIGH (ref 0.3–1.2)
Total Protein: 5.9 g/dL — ABNORMAL LOW (ref 6.5–8.1)

## 2016-11-24 LAB — TYPE AND SCREEN
ABO/RH(D): O POS
ANTIBODY SCREEN: NEGATIVE

## 2016-11-24 LAB — URINALYSIS, ROUTINE W REFLEX MICROSCOPIC
Bilirubin Urine: NEGATIVE
GLUCOSE, UA: NEGATIVE mg/dL
HGB URINE DIPSTICK: NEGATIVE
Ketones, ur: NEGATIVE mg/dL
Leukocytes, UA: NEGATIVE
Nitrite: NEGATIVE
PH: 5 (ref 5.0–8.0)
Protein, ur: NEGATIVE mg/dL
SPECIFIC GRAVITY, URINE: 1.019 (ref 1.005–1.030)

## 2016-11-24 LAB — BLOOD GAS, ARTERIAL
Acid-Base Excess: 0.4 mmol/L (ref 0.0–2.0)
Bicarbonate: 24.1 mmol/L (ref 20.0–28.0)
DRAWN BY: 470591
FIO2: 21
O2 Saturation: 95.7 %
PATIENT TEMPERATURE: 98.6
PH ART: 7.44 (ref 7.350–7.450)
pCO2 arterial: 36 mmHg (ref 32.0–48.0)
pO2, Arterial: 79.1 mmHg — ABNORMAL LOW (ref 83.0–108.0)

## 2016-11-24 LAB — SURGICAL PCR SCREEN
MRSA, PCR: NEGATIVE
STAPHYLOCOCCUS AUREUS: NEGATIVE

## 2016-11-24 LAB — CBC
HCT: 40.6 % (ref 39.0–52.0)
HEMOGLOBIN: 14 g/dL (ref 13.0–17.0)
MCH: 32.3 pg (ref 26.0–34.0)
MCHC: 34.5 g/dL (ref 30.0–36.0)
MCV: 93.8 fL (ref 78.0–100.0)
Platelets: 118 10*3/uL — ABNORMAL LOW (ref 150–400)
RBC: 4.33 MIL/uL (ref 4.22–5.81)
RDW: 13.2 % (ref 11.5–15.5)
WBC: 3.6 10*3/uL — AB (ref 4.0–10.5)

## 2016-11-24 LAB — PROTIME-INR
INR: 1.87
Prothrombin Time: 21.3 seconds — ABNORMAL HIGH (ref 11.4–15.2)

## 2016-11-24 LAB — APTT: aPTT: 35 seconds (ref 24–36)

## 2016-11-24 LAB — ABO/RH: ABO/RH(D): O POS

## 2016-11-24 MED ORDER — CHLORHEXIDINE GLUCONATE CLOTH 2 % EX PADS: 6.0000 | MEDICATED_PAD | Freq: Once | CUTANEOUS | Status: DC

## 2016-11-25 NOTE — Progress Notes (Signed)
Anesthesia Chart Review:  Pt is a 76 year old male scheduled for abdominal aortic endovascular stent graft on 11/28/2016 with Deitra Mayo, MD  - PCP is Lew Dawes, MS - Cardiologist is Lauree Chandler, MD who cleared pt for surgery at last office visit 11/13/16  PMH includes:  HTN, aortic insufficiency (s/p AV replacement and aortic root replacement 2003), bradycardia, HTN. Former smoker. BMI 24  Medications include: Amlodipine, Lipitor, Protonix, tadalfil, Coumadin. Pt stopped coumadin 11/22/16.   BP 139/64   Pulse 65   Temp 36.5 C   Resp 18   Ht 6' (1.829 m)   Wt 174 lb 1.6 oz (79 kg)   SpO2 96%   BMI 23.61 kg/m   Preoperative labs reviewed.  PT 21.3, PTT normal. Will recheck PT DOS.   EKG 11/07/16: sinus bradycardia (53 bpm).   Carotid duplex 11/12/16:  - Normal carotid arteries, bilaterally. - Normal subclavian arteries, bilaterally. - Patent vertebral arteries with antegrade flow.  Echo 06/30/16:  - Left ventricle: The cavity size was normal. Wall thickness was increased in a pattern of mild LVH. Systolic function was normal. The estimated ejection fraction was in the range of 60% to 65%. Wall motion was normal; there were no regional wall motion abnormalities. Doppler parameters are consistent with abnormal left ventricular relaxation (grade 1 diastolic dysfunction). - Aortic valve: A mechanical prosthesis was present. There was trivial regurgitation. - Aortic root: The aortic root was mildly dilated. - Mitral valve: There was mild regurgitation. - Right ventricle: The cavity size was mildly dilated. - Right atrium: The atrium was mildly dilated. - Impressions:  Normal LV function; mild diastolic dysfunction; s/p AVR with normal gradients and trace AI; mildly dilated aortic root; mild MR; mild RAE and RVE; mild TR.  Cardiac cath 03/02/01:  1. Large ascending aortic aneurysm. 2. Normal coronary angiography. 3. Normal left ventricular function.  If labs  acceptable DOS, I anticipate pt can proceed with surgery as scheduled.   Willeen Cass, FNP-BC [facility] Stay [facility]/Anesthesiology Phone: [phone] 11/25/2016 3:11 PM

## 2016-11-27 NOTE — Anesthesia Preprocedure Evaluation (Addendum)
Anesthesia Evaluation  Patient identified by MRN, date of birth, ID band Patient awake    Reviewed: Allergy & Precautions, H&P , NPO status , Patient's Chart, lab work & pertinent test results  History of Anesthesia Complications Negative for: history of anesthetic complications  Airway Mallampati: I  TM Distance: >3 FB Neck ROM: Full    Dental  (+) Teeth Intact, Caps, Dental Advisory Given   Pulmonary former smoker,    breath sounds clear to auscultation       Cardiovascular Exercise Tolerance: Good hypertension, + Peripheral Vascular Disease  (-) dysrhythmias + Valvular Problems/Murmurs (s/p AVR, [place], on coumadin (off since 02/27/12), INR 1.4 on 02/26/12)  Rhythm:Regular Rate:Bradycardia + Systolic Click TTE 08/6718 - Mild LVH. Ejection fraction was in the range of 60% to 65%.Grade 1 diastolic dysfunction. - Aortic valve: A mechanical prosthesis was present. There was trivial regurgitation. - Aortic root: The aortic root was mildly dilated. - Mitral valve: There was mild regurgitation. - Right ventricle: The cavity size was mildly dilated. - Right atrium: The atrium was mildly dilated.   Neuro/Psych negative neurological ROS  negative psych ROS   GI/Hepatic Neg liver ROS, GERD  Controlled and Medicated,  Endo/Other  negative endocrine ROS  Renal/GU Renal InsufficiencyRenal disease     Musculoskeletal  (+) Arthritis ,   Abdominal   Peds  Hematology Thrombocytopenia   Anesthesia Other Findings   Reproductive/Obstetrics                            Anesthesia Physical  Anesthesia Plan  ASA: III  Anesthesia Plan: General   Post-op Pain Management:    Induction: Intravenous  PONV Risk Score and Plan: 3 and Ondansetron, Dexamethasone and Treatment may vary due to age or medical condition  Airway Management Planned: Oral ETT  Additional Equipment: Arterial line  Intra-op Plan:    Post-operative Plan: Extubation in OR  Informed Consent: I have reviewed the patients History and Physical, chart, labs and discussed the procedure including the risks, benefits and alternatives for the proposed anesthesia with the patient or authorized representative who has indicated his/her understanding and acceptance.   Dental advisory given  Plan Discussed with: CRNA  Anesthesia Plan Comments:         Anesthesia Quick Evaluation

## 2016-11-28 ENCOUNTER — Inpatient Hospital Stay (HOSPITAL_COMMUNITY): Payer: Medicare Other | Admitting: Emergency Medicine

## 2016-11-28 ENCOUNTER — Inpatient Hospital Stay (HOSPITAL_COMMUNITY): Payer: Medicare Other

## 2016-11-28 ENCOUNTER — Encounter (HOSPITAL_COMMUNITY)
Admission: RE | Disposition: A | Discharge status: Home / Self Care | Payer: Self-pay | Source: Ambulatory Visit | Attending: Vascular Surgery

## 2016-11-28 ENCOUNTER — Other Ambulatory Visit: Payer: Self-pay

## 2016-11-28 ENCOUNTER — Inpatient Hospital Stay (HOSPITAL_COMMUNITY)
Admission: RE | Admit: 2016-11-28 | Discharge: 2016-11-29 | DRG: 269 | Disposition: A | Discharge status: Home / Self Care | Payer: Medicare Other | Source: Ambulatory Visit | Attending: Vascular Surgery | Admitting: Vascular Surgery

## 2016-11-28 ENCOUNTER — Inpatient Hospital Stay (HOSPITAL_COMMUNITY): Payer: Medicare Other | Admitting: Vascular Surgery

## 2016-11-28 ENCOUNTER — Encounter (HOSPITAL_COMMUNITY): Payer: Self-pay

## 2016-11-28 DIAGNOSIS — G43909 Migraine, unspecified, not intractable, without status migrainosus: Secondary | ICD-10-CM | POA: Diagnosis present

## 2016-11-28 DIAGNOSIS — Z9889 Other specified postprocedural states: Secondary | ICD-10-CM

## 2016-11-28 DIAGNOSIS — N400 Enlarged prostate without lower urinary tract symptoms: Secondary | ICD-10-CM | POA: Diagnosis present

## 2016-11-28 DIAGNOSIS — I714 Abdominal aortic aneurysm, without rupture, unspecified: Secondary | ICD-10-CM | POA: Diagnosis present

## 2016-11-28 DIAGNOSIS — Z87891 Personal history of nicotine dependence: Secondary | ICD-10-CM | POA: Diagnosis not present

## 2016-11-28 DIAGNOSIS — Z5181 Encounter for therapeutic drug level monitoring: Secondary | ICD-10-CM | POA: Diagnosis not present

## 2016-11-28 DIAGNOSIS — Z8249 Family history of ischemic heart disease and other diseases of the circulatory system: Secondary | ICD-10-CM | POA: Diagnosis not present

## 2016-11-28 DIAGNOSIS — Z95828 Presence of other vascular implants and grafts: Secondary | ICD-10-CM | POA: Diagnosis not present

## 2016-11-28 DIAGNOSIS — Z952 Presence of prosthetic heart valve: Secondary | ICD-10-CM

## 2016-11-28 DIAGNOSIS — Z885 Allergy status to narcotic agent status: Secondary | ICD-10-CM

## 2016-11-28 DIAGNOSIS — N289 Disorder of kidney and ureter, unspecified: Secondary | ICD-10-CM | POA: Diagnosis present

## 2016-11-28 DIAGNOSIS — E785 Hyperlipidemia, unspecified: Secondary | ICD-10-CM | POA: Diagnosis not present

## 2016-11-28 DIAGNOSIS — Z8041 Family history of malignant neoplasm of ovary: Secondary | ICD-10-CM | POA: Diagnosis not present

## 2016-11-28 DIAGNOSIS — K5790 Diverticulosis of intestine, part unspecified, without perforation or abscess without bleeding: Secondary | ICD-10-CM | POA: Diagnosis present

## 2016-11-28 DIAGNOSIS — Z7901 Long term (current) use of anticoagulants: Secondary | ICD-10-CM

## 2016-11-28 DIAGNOSIS — K219 Gastro-esophageal reflux disease without esophagitis: Secondary | ICD-10-CM | POA: Diagnosis present

## 2016-11-28 DIAGNOSIS — Z8679 Personal history of other diseases of the circulatory system: Secondary | ICD-10-CM

## 2016-11-28 DIAGNOSIS — I739 Peripheral vascular disease, unspecified: Secondary | ICD-10-CM | POA: Diagnosis present

## 2016-11-28 DIAGNOSIS — I10 Essential (primary) hypertension: Secondary | ICD-10-CM | POA: Diagnosis present

## 2016-11-28 DIAGNOSIS — I517 Cardiomegaly: Secondary | ICD-10-CM | POA: Diagnosis not present

## 2016-11-28 HISTORY — PX: ABDOMINAL AORTIC ENDOVASCULAR STENT GRAFT: SHX5707

## 2016-11-28 LAB — CBC
HEMATOCRIT: 38.4 % — AB (ref 39.0–52.0)
HEMOGLOBIN: 13.4 g/dL (ref 13.0–17.0)
MCH: 32.9 pg (ref 26.0–34.0)
MCHC: 34.9 g/dL (ref 30.0–36.0)
MCV: 94.3 fL (ref 78.0–100.0)
Platelets: 95 10*3/uL — ABNORMAL LOW (ref 150–400)
RBC: 4.07 MIL/uL — AB (ref 4.22–5.81)
RDW: 13.2 % (ref 11.5–15.5)
WBC: 4.1 10*3/uL (ref 4.0–10.5)

## 2016-11-28 LAB — BASIC METABOLIC PANEL
Anion gap: 4 — ABNORMAL LOW (ref 5–15)
BUN: 15 mg/dL (ref 6–20)
CHLORIDE: 109 mmol/L (ref 101–111)
CO2: 24 mmol/L (ref 22–32)
Calcium: 8.5 mg/dL — ABNORMAL LOW (ref 8.9–10.3)
Creatinine, Ser: 1.08 mg/dL (ref 0.61–1.24)
GFR calc Af Amer: >60 mL/min (ref >60)
GFR calc non Af Amer: >60 mL/min (ref >60)
GLUCOSE: 113 mg/dL — AB (ref 65–99)
POTASSIUM: 4.1 mmol/L (ref 3.5–5.1)
SODIUM: 137 mmol/L (ref 135–145)

## 2016-11-28 LAB — MAGNESIUM: MAGNESIUM: 1.9 mg/dL (ref 1.7–2.4)

## 2016-11-28 LAB — PROTIME-INR
INR: 1.1
INR: 1.18
PROTHROMBIN TIME: 14.1 s (ref 11.4–15.2)
Prothrombin Time: 14.9 seconds (ref 11.4–15.2)

## 2016-11-28 LAB — APTT: aPTT: 38 seconds — ABNORMAL HIGH (ref 24–36)

## 2016-11-28 SURGERY — INSERTION, ENDOVASCULAR STENT GRAFT, AORTA, ABDOMINAL
Anesthesia: General

## 2016-11-28 MED ORDER — OXYCODONE-ACETAMINOPHEN 5-325 MG PO TABS: 1.0000 | ORAL_TABLET | Freq: Four times a day (QID) | ORAL | 0 refills | Status: DC | PRN

## 2016-11-28 MED ORDER — AMLODIPINE BESYLATE 5 MG PO TABS
5.0000 mg | ORAL_TABLET | Freq: Every day | ORAL | Status: DC
  Administered 2016-11-29: 5 mg via ORAL
  Filled 2016-11-28: qty 1

## 2016-11-28 MED ORDER — ONDANSETRON HCL 4 MG/2ML IJ SOLN
INTRAMUSCULAR | Status: AC
  Filled 2016-11-28: qty 2

## 2016-11-28 MED ORDER — DOCUSATE SODIUM 100 MG PO CAPS
100.0000 mg | ORAL_CAPSULE | Freq: Every day | ORAL | Status: DC
  Administered 2016-11-29: 100 mg via ORAL
  Filled 2016-11-28: qty 1

## 2016-11-28 MED ORDER — PROTAMINE SULFATE 10 MG/ML IV SOLN
INTRAVENOUS | Status: DC | PRN
  Administered 2016-11-28: 40 mg via INTRAVENOUS

## 2016-11-28 MED ORDER — HYDRALAZINE HCL 20 MG/ML IJ SOLN: 5.0000 mg | INTRAMUSCULAR | Status: DC | PRN

## 2016-11-28 MED ORDER — HEPARIN SODIUM (PORCINE) 1000 UNIT/ML IJ SOLN
INTRAMUSCULAR | Status: AC
  Filled 2016-11-28: qty 1

## 2016-11-28 MED ORDER — HEPARIN SODIUM (PORCINE) 1000 UNIT/ML IJ SOLN
INTRAMUSCULAR | Status: DC | PRN
  Administered 2016-11-28: 8000 [IU] via INTRAVENOUS

## 2016-11-28 MED ORDER — PHENOL 1.4 % MT LIQD: 1.0000 | OROMUCOSAL | Status: DC | PRN

## 2016-11-28 MED ORDER — ACETAMINOPHEN 650 MG RE SUPP: 325.0000 mg | RECTAL | Status: DC | PRN

## 2016-11-28 MED ORDER — ATORVASTATIN CALCIUM 10 MG PO TABS
10.0000 mg | ORAL_TABLET | Freq: Every day | ORAL | Status: DC
  Administered 2016-11-28: 10 mg via ORAL
  Filled 2016-11-28: qty 1

## 2016-11-28 MED ORDER — MIDAZOLAM HCL 2 MG/2ML IJ SOLN
INTRAMUSCULAR | Status: DC | PRN
  Administered 2016-11-28: 1 mg via INTRAVENOUS

## 2016-11-28 MED ORDER — SUGAMMADEX SODIUM 200 MG/2ML IV SOLN
INTRAVENOUS | Status: AC
  Filled 2016-11-28: qty 2

## 2016-11-28 MED ORDER — HYDROMORPHONE HCL 1 MG/ML IJ SOLN: 0.5000 mg | INTRAMUSCULAR | Status: DC | PRN

## 2016-11-28 MED ORDER — PANTOPRAZOLE SODIUM 40 MG PO TBEC
40.0000 mg | DELAYED_RELEASE_TABLET | Freq: Every day | ORAL | Status: DC
  Administered 2016-11-28 – 2016-11-29 (×2): 40 mg via ORAL
  Filled 2016-11-28 (×2): qty 1

## 2016-11-28 MED ORDER — LIDOCAINE 2% (20 MG/ML) 5 ML SYRINGE
INTRAMUSCULAR | Status: DC | PRN
  Administered 2016-11-28: 60 mg via INTRAVENOUS

## 2016-11-28 MED ORDER — FENTANYL CITRATE (PF) 100 MCG/2ML IJ SOLN
INTRAMUSCULAR | Status: AC
  Administered 2016-11-28: 50 ug via INTRAVENOUS
  Filled 2016-11-28: qty 2

## 2016-11-28 MED ORDER — LABETALOL HCL 5 MG/ML IV SOLN: 10.0000 mg | INTRAVENOUS | Status: DC | PRN

## 2016-11-28 MED ORDER — FENTANYL CITRATE (PF) 100 MCG/2ML IJ SOLN
INTRAMUSCULAR | Status: DC | PRN
  Administered 2016-11-28 (×3): 50 ug via INTRAVENOUS

## 2016-11-28 MED ORDER — SODIUM CHLORIDE 0.9 % IV SOLN
INTRAVENOUS | Status: DC
  Administered 2016-11-28 – 2016-11-29 (×2): via INTRAVENOUS

## 2016-11-28 MED ORDER — PROPOFOL 10 MG/ML IV BOLUS
INTRAVENOUS | Status: DC | PRN
  Administered 2016-11-28: 150 mg via INTRAVENOUS

## 2016-11-28 MED ORDER — ROCURONIUM BROMIDE 10 MG/ML (PF) SYRINGE
PREFILLED_SYRINGE | INTRAVENOUS | Status: DC | PRN
  Administered 2016-11-28: 60 mg via INTRAVENOUS

## 2016-11-28 MED ORDER — METOPROLOL TARTRATE 5 MG/5ML IV SOLN: 2.0000 mg | INTRAVENOUS | Status: DC | PRN

## 2016-11-28 MED ORDER — WARFARIN SODIUM 7.5 MG PO TABS
7.5000 mg | ORAL_TABLET | Freq: Once | ORAL | Status: AC
  Administered 2016-11-28: 7.5 mg via ORAL
  Filled 2016-11-28: qty 1

## 2016-11-28 MED ORDER — HEPARIN (PORCINE) IN NACL 100-0.45 UNIT/ML-% IJ SOLN: 1200.0000 [IU]/h | INTRAMUSCULAR | Status: DC

## 2016-11-28 MED ORDER — ALUM & MAG HYDROXIDE-SIMETH 200-200-20 MG/5ML PO SUSP: 15.0000 mL | ORAL | Status: DC | PRN

## 2016-11-28 MED ORDER — SODIUM CHLORIDE 0.9 % IV SOLN: INTRAVENOUS | Status: DC

## 2016-11-28 MED ORDER — MAGNESIUM SULFATE 2 GM/50ML IV SOLN
2.0000 g | Freq: Every day | INTRAVENOUS | Status: DC | PRN
  Filled 2016-11-28: qty 50

## 2016-11-28 MED ORDER — DESONIDE 0.05 % EX OINT
1.0000 "application " | TOPICAL_OINTMENT | Freq: Every day | CUTANEOUS | Status: DC | PRN
  Filled 2016-11-28: qty 15

## 2016-11-28 MED ORDER — PHENYLEPHRINE HCL 10 MG/ML IJ SOLN
INTRAVENOUS | Status: DC | PRN
  Administered 2016-11-28: 50 ug/min via INTRAVENOUS

## 2016-11-28 MED ORDER — PROTAMINE SULFATE 10 MG/ML IV SOLN
INTRAVENOUS | Status: AC
  Filled 2016-11-28: qty 5

## 2016-11-28 MED ORDER — ENOXAPARIN SODIUM 40 MG/0.4ML ~~LOC~~ SOLN: 40.0000 mg | SUBCUTANEOUS | Status: DC

## 2016-11-28 MED ORDER — SUGAMMADEX SODIUM 200 MG/2ML IV SOLN
INTRAVENOUS | Status: DC | PRN
  Administered 2016-11-28: 200 mg via INTRAVENOUS

## 2016-11-28 MED ORDER — ACETAMINOPHEN 325 MG PO TABS: 325.0000 mg | ORAL_TABLET | ORAL | Status: DC | PRN

## 2016-11-28 MED ORDER — POTASSIUM CHLORIDE CRYS ER 20 MEQ PO TBCR
20.0000 meq | EXTENDED_RELEASE_TABLET | Freq: Every day | ORAL | Status: DC | PRN
Start: 2016-11-28 — End: 2016-11-29

## 2016-11-28 MED ORDER — MIDAZOLAM HCL 2 MG/2ML IJ SOLN
INTRAMUSCULAR | Status: AC
  Filled 2016-11-28: qty 2

## 2016-11-28 MED ORDER — 0.9 % SODIUM CHLORIDE (POUR BTL) OPTIME
TOPICAL | Status: DC | PRN
Start: 2016-11-28 — End: 2016-11-28
  Administered 2016-11-28: 1000 mL

## 2016-11-28 MED ORDER — ROCURONIUM BROMIDE 10 MG/ML (PF) SYRINGE
PREFILLED_SYRINGE | INTRAVENOUS | Status: AC
  Filled 2016-11-28: qty 5

## 2016-11-28 MED ORDER — DEXAMETHASONE SODIUM PHOSPHATE 10 MG/ML IJ SOLN
INTRAMUSCULAR | Status: AC
  Filled 2016-11-28: qty 1

## 2016-11-28 MED ORDER — WARFARIN SODIUM 5 MG PO TABS: 5.0000 mg | ORAL_TABLET | Freq: Once | ORAL | Status: DC

## 2016-11-28 MED ORDER — SODIUM CHLORIDE 0.9 % IV SOLN
INTRAVENOUS | Status: DC | PRN
  Administered 2016-11-28: 07:00:00

## 2016-11-28 MED ORDER — FENTANYL CITRATE (PF) 250 MCG/5ML IJ SOLN
INTRAMUSCULAR | Status: AC
  Filled 2016-11-28: qty 5

## 2016-11-28 MED ORDER — ONDANSETRON HCL 4 MG/2ML IJ SOLN
INTRAMUSCULAR | Status: DC | PRN
  Administered 2016-11-28: 4 mg via INTRAVENOUS

## 2016-11-28 MED ORDER — WARFARIN - PHYSICIAN DOSING INPATIENT: Freq: Every day | Status: DC

## 2016-11-28 MED ORDER — SODIUM CHLORIDE 0.9 % IV SOLN: 500.0000 mL | Freq: Once | INTRAVENOUS | Status: DC | PRN

## 2016-11-28 MED ORDER — LACTATED RINGERS IV SOLN
INTRAVENOUS | Status: DC | PRN
  Administered 2016-11-28: 07:00:00 via INTRAVENOUS

## 2016-11-28 MED ORDER — GUAIFENESIN-DM 100-10 MG/5ML PO SYRP: 15.0000 mL | ORAL_SOLUTION | ORAL | Status: DC | PRN

## 2016-11-28 MED ORDER — ONDANSETRON HCL 4 MG/2ML IJ SOLN: 4.0000 mg | Freq: Once | INTRAMUSCULAR | Status: DC | PRN

## 2016-11-28 MED ORDER — TADALAFIL 5 MG PO TABS
5.0000 mg | ORAL_TABLET | Freq: Every day | ORAL | Status: DC | PRN
Start: 2016-11-28 — End: 2016-11-28

## 2016-11-28 MED ORDER — PROPOFOL 10 MG/ML IV BOLUS
INTRAVENOUS | Status: AC
  Filled 2016-11-28: qty 20

## 2016-11-28 MED ORDER — GLYCOPYRROLATE 0.2 MG/ML IJ SOLN
INTRAMUSCULAR | Status: DC | PRN
  Administered 2016-11-28: 0.2 mg via INTRAVENOUS

## 2016-11-28 MED ORDER — AMOXICILLIN 500 MG PO CAPS
500.0000 mg | ORAL_CAPSULE | Freq: Three times a day (TID) | ORAL | Status: DC
  Administered 2016-11-28 – 2016-11-29 (×3): 500 mg via ORAL
  Filled 2016-11-28 (×3): qty 1

## 2016-11-28 MED ORDER — LIDOCAINE 2% (20 MG/ML) 5 ML SYRINGE
INTRAMUSCULAR | Status: AC
  Filled 2016-11-28: qty 5

## 2016-11-28 MED ORDER — DEXTROSE 5 % IV SOLN
INTRAVENOUS | Status: AC
  Filled 2016-11-28: qty 1.5

## 2016-11-28 MED ORDER — OXYCODONE-ACETAMINOPHEN 5-325 MG PO TABS
1.0000 | ORAL_TABLET | ORAL | Status: DC | PRN
  Administered 2016-11-28: 2 via ORAL
  Administered 2016-11-29: 1 via ORAL
  Filled 2016-11-28: qty 2
  Filled 2016-11-28: qty 1

## 2016-11-28 MED ORDER — SUMATRIPTAN SUCCINATE 50 MG PO TABS
50.0000 mg | ORAL_TABLET | ORAL | Status: DC | PRN
  Filled 2016-11-28: qty 1

## 2016-11-28 MED ORDER — FENTANYL CITRATE (PF) 100 MCG/2ML IJ SOLN
25.0000 ug | INTRAMUSCULAR | Status: DC | PRN
  Administered 2016-11-28 (×2): 50 ug via INTRAVENOUS

## 2016-11-28 MED ORDER — DEXAMETHASONE SODIUM PHOSPHATE 10 MG/ML IJ SOLN
INTRAMUSCULAR | Status: DC | PRN
  Administered 2016-11-28: 10 mg via INTRAVENOUS

## 2016-11-28 MED ORDER — PHENYLEPHRINE 40 MCG/ML (10ML) SYRINGE FOR IV PUSH (FOR BLOOD PRESSURE SUPPORT)
PREFILLED_SYRINGE | INTRAVENOUS | Status: DC | PRN
  Administered 2016-11-28: 120 ug via INTRAVENOUS
  Administered 2016-11-28: 80 ug via INTRAVENOUS
  Administered 2016-11-28: 120 ug via INTRAVENOUS

## 2016-11-28 MED ORDER — IODIXANOL 320 MG/ML IV SOLN
INTRAVENOUS | Status: DC | PRN
Start: 2016-11-28 — End: 2016-11-28
  Administered 2016-11-28: 40.4 mL via INTRAVENOUS

## 2016-11-28 MED ORDER — DEXTROSE 5 % IV SOLN
1.5000 g | INTRAVENOUS | Status: AC
  Administered 2016-11-28: 1.5 g via INTRAVENOUS

## 2016-11-28 MED ORDER — HEPARIN (PORCINE) IN NACL 100-0.45 UNIT/ML-% IJ SOLN
1200.0000 [IU]/h | INTRAMUSCULAR | Status: DC
  Administered 2016-11-28: 1200 [IU]/h via INTRAVENOUS
  Filled 2016-11-28 (×2): qty 250

## 2016-11-28 MED ORDER — ONDANSETRON HCL 4 MG/2ML IJ SOLN: 4.0000 mg | Freq: Four times a day (QID) | INTRAMUSCULAR | Status: DC | PRN

## 2016-11-28 SURGICAL SUPPLY — 49 items
BLADE CLIPPER SURG (BLADE) ×2 IMPLANT
CANISTER SUCT 3000ML PPV (MISCELLANEOUS) ×2 IMPLANT
CATH ACCU-VU SIZ PIG 5F 70CM (CATHETERS) IMPLANT
CATH ANGIO 5F BER2 65CM (CATHETERS) ×4 IMPLANT
CATH BEACON 5.038 65CM KMP-01 (CATHETERS) IMPLANT
CATH OMNI FLUSH .035X70CM (CATHETERS) ×4 IMPLANT
COVER PROBE W GEL 5X96 (DRAPES) ×2 IMPLANT
DERMABOND ADVANCED (GAUZE/BANDAGES/DRESSINGS) ×1
DERMABOND ADVANCED .7 DNX12 (GAUZE/BANDAGES/DRESSINGS) ×1 IMPLANT
DEVICE CLOSURE PERCLS PRGLD 6F (VASCULAR PRODUCTS) ×2 IMPLANT
DRSG TEGADERM 2-3/8X2-3/4 SM (GAUZE/BANDAGES/DRESSINGS) ×4 IMPLANT
DRSG TEGADERM 4X4.75 (GAUZE/BANDAGES/DRESSINGS) ×2 IMPLANT
DRYSEAL FLEXSHEATH 18FR 33CM (SHEATH) ×2
ELECT REM PT RETURN 9FT ADLT (ELECTROSURGICAL) ×4
ELECTRODE REM PT RTRN 9FT ADLT (ELECTROSURGICAL) ×2 IMPLANT
ENDOPROSTHESIS THORAC 21X21X10 (Endovascular Graft) ×1 IMPLANT
ENDOPROTH THORACIC 21X21X10 (Endovascular Graft) ×2 IMPLANT
GAUZE SPONGE 2X2 8PLY STRL LF (GAUZE/BANDAGES/DRESSINGS) ×1 IMPLANT
GLOVE BIO SURGEON STRL SZ7.5 (GLOVE) ×2 IMPLANT
GLOVE BIOGEL PI IND STRL 8 (GLOVE) ×1 IMPLANT
GLOVE BIOGEL PI INDICATOR 8 (GLOVE) ×1
GLOVE ECLIPSE 7.5 STRL STRAW (GLOVE) ×4 IMPLANT
GLOVE SURG SS PI 7.0 STRL IVOR (GLOVE) ×2 IMPLANT
GOWN STRL REUS W/ TWL LRG LVL3 (GOWN DISPOSABLE) ×2 IMPLANT
GOWN STRL REUS W/TWL LRG LVL3 (GOWN DISPOSABLE) ×2
GRAFT BALLN CATH 65CM (STENTS) ×1 IMPLANT
KIT BASIN OR (CUSTOM PROCEDURE TRAY) ×2 IMPLANT
KIT ROOM TURNOVER OR (KITS) ×2 IMPLANT
NEEDLE PERC 18GX7CM (NEEDLE) ×2 IMPLANT
NS IRRIG 1000ML POUR BTL (IV SOLUTION) ×2 IMPLANT
PACK ENDOVASCULAR (PACKS) ×2 IMPLANT
PAD ARMBOARD 7.5X6 YLW CONV (MISCELLANEOUS) ×4 IMPLANT
PERCLOSE PROGLIDE 6F (VASCULAR PRODUCTS) ×4
SET MICROPUNCTURE 5F STIFF (MISCELLANEOUS) ×2 IMPLANT
SHEATH AVANTI 11CM 8FR (MISCELLANEOUS) IMPLANT
SHEATH BRITE TIP 8FR 23CM (MISCELLANEOUS) IMPLANT
SHEATH DRYSEAL FLEX 18FR 33CM (SHEATH) ×2 IMPLANT
SHEATH PINNACLE 8F 10CM (SHEATH) ×2 IMPLANT
SPONGE GAUZE 2X2 STER 10/PKG (GAUZE/BANDAGES/DRESSINGS) ×1
STAPLER VISISTAT (STAPLE) IMPLANT
STENT GRAFT BALLN CATH 65CM (STENTS) ×1
STOPCOCK MORSE 400PSI 3WAY (MISCELLANEOUS) ×2 IMPLANT
SUT PROLENE 5 0 C 1 24 (SUTURE) ×2 IMPLANT
SUT VICRYL 4-0 PS2 18IN ABS (SUTURE) ×4 IMPLANT
SYR 30ML LL (SYRINGE) ×2 IMPLANT
TRAY FOLEY W/METER SILVER 16FR (SET/KITS/TRAYS/PACK) ×2 IMPLANT
TUBING HIGH PRESSURE 120CM (CONNECTOR) ×2 IMPLANT
WIRE AMPLATZ SS-J .035X180CM (WIRE) ×4 IMPLANT
WIRE BENTSON .035X145CM (WIRE) ×2 IMPLANT

## 2016-11-28 NOTE — Transfer of Care (Signed)
Immediate Anesthesia Transfer of Care Note  Patient: ORVAN PAPADAKIS  Procedure(s) Performed: ABDOMINAL AORTIC ENDOVASCULAR STENT GRAFT GORE ONE PIECE STENT (N/A )  Patient Location: PACU  Anesthesia Type:General  Level of Consciousness: awake, alert , oriented and patient cooperative  Airway & Oxygen Therapy: Patient Spontanous Breathing and Patient connected to nasal cannula oxygen  Post-op Assessment: Report given to RN, Post -op Vital signs reviewed and stable and Patient moving all extremities  Post vital signs: Reviewed and stable  Last Vitals:  Vitals:   11/28/16 0723 11/28/16 0724  BP:    Pulse: (!) 51 (!) 49  Resp: 10 (!) 7  Temp:    SpO2: 97% 97%    Last Pain:  Vitals:   11/28/16 0603  TempSrc: Oral      Patients Stated Pain Goal: 3 (31/59/45 8592)  Complications: No apparent anesthesia complications

## 2016-11-28 NOTE — Progress Notes (Signed)
    Will give one dose of Coumadin 5 mg tonight.  Patient has mechanical aortic valve.  Pending cardiology note if he needs to be bridged with Lovenox or just restart normal Coumadin dose plan for discharge home tomorrow.      Instruction from DR. [facility] Cardiology consult called for instruction on Lovenox verses resume Coumadin dose.   Isra Lindy MAUREEN PA-C

## 2016-11-28 NOTE — Consult Note (Signed)
Cardiology Consult    Patient ID: Patrick [name]; [id number]; Mar 06, 1940   Admit date: 11/28/2016 Date of Consult: 11/28/2016  Primary Care Provider: Cassandria Anger, MD Primary Cardiologist: Dr. Angelena Form   Patient Profile    Patrick [name] is a 76 y.o. 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 Scot Dock.   History of Present Illness    Patrick [name] was evaluated by Dr. Angelena Form in 08/2016 and reported doing well from a cardiac perspective at that time, denying any recent chest pain or dyspnea on exertion. 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 He was evaluated by Dr. Scot Dock and surgical repair was recommended due to the risk of rupture. This was reviewed with Dr. Angelena Form and it was recommended he plan to hold Coumadin for 5 days prior to the procedure and that he would not need bridging due to no history of PAF or prior CVA.    He presented to [facility] on 11/28/2016 for repair of the aneurysm with placement of a 21 mm x 21 mm x 10 cm CTAG device. Arteriogram showed no evidence of a Type 1 endoleak but there was a small Type 2 endoleak.   In talking with the patient in PACU, he reports his pain is currently well-controlled. No chest pain or palpitations. Breathing is at baseline. He is maintaining sinus bradycardia on telemetry with HR in the mid-40's to 50's.   He denies any prior history of significant bleeding on Coumadin. No known history of CVA, TIA's, PE, or DVT.   Past Medical History   Past Medical History:  Diagnosis Date  . Anticoagulant long-term use   . Aortic insufficiency    a. s/p aortic valve replacement and aneurysm repair with  a St. Jude conduit in 02/2001.  b. 06/2016: echo showed EF of 60-65%, Grade 1 DD, and normal functioning of the mechanical aortic prosthesis.  Marland Kitchen BPH (benign prostatic hyperplasia)   . Bradycardia    Beta blocker discontinued  . Diverticulosis   . HTN (hypertension)   . Migraine headache   . S/P cardiac cath    [facility] in 2003 demonstrated normal coronary arteries     Allergies:   Allergies  Allergen Reactions  . Codeine Rash    Home Medications:   Home Medications:  Prior to Admission medications   Medication Sig Start Date End Date Taking? Authorizing Provider  amLODipine (NORVASC) 5 MG tablet Take 1 tablet (5 mg total) by mouth daily. 05/05/16  Yes Jerline Pain, MD  amoxicillin (AMOXIL) 500 MG capsule take 1 capsule by mouth four times a day Patient taking differently: premed 01/21/16  Yes [name], [name], MD  atorvastatin (LIPITOR) 10 MG tablet Take 1 tablet (10 mg total) by mouth daily. 11/07/16  Yes [name], [name], MD  desonide (DESOWEN) 0.05 % cream Apply 1 application topically daily as needed. Patient taking differently: Apply 1 application topically daily as needed (rash).  05/27/11  Yes [name], [name], MD  pantoprazole (PROTONIX) 40 MG tablet Take 1 tablet (40 mg total) by mouth daily. 05/28/16  Yes [name], [name], MD  SUMAtriptan (IMITREX) 50 MG tablet take  1/2 tablet by mouth every 2 hours if needed as directed 07/16/16  Yes [name], [name], MD  tadalafil (CIALIS) 5 MG tablet Take 1 tablet (5 mg total) by mouth daily as needed for erectile dysfunction. Patient taking differently: Take 5 mg by mouth daily as needed (blood pressure).  07/07/16  Yes [name], [name], MD  warfarin (COUMADIN) 7.5 MG tablet take 1 tablet by mouth once daily Patient taking differently: Take 7.5mg  by mouth once daily 08/05/16  Yes Burnell Blanks, MD    Inpatient Medications    Scheduled Meds: . warfarin  5 mg Oral ONCE-1800   Continuous Infusions: . sodium  chloride    . sodium chloride     PRN Meds: sodium chloride, fentaNYL (SUBLIMAZE) injection, hydrALAZINE, labetalol, metoprolol tartrate, ondansetron (ZOFRAN) IV  Family History    Family History  Problem Relation Age of Onset  . Cancer Mother        ovarian  . CAD Father   . Colon cancer Neg Hx     Social History    Social History   Social History  . Marital status: Married    Spouse name: N/A  . Number of children: 2  . Years of education: N/A   Occupational History  . [employer] Self Employed   Social History Main Topics  . Smoking status: Former Smoker    Packs/day: 1.00    Years: 10.00    Types: Cigarettes    Quit date: 08/08/1975  . Smokeless tobacco: Never Used  . Alcohol use 0.0 oz/week     Comment: ONLY DRINK ON FRIDAYS  . Drug use: No  . Sexual activity: Yes   Other Topics Concern  . Not on file   Social History Narrative  . No narrative on file     Review of Systems    General:  No chills, fever, night sweats or weight changes.  Cardiovascular:  No chest pain, dyspnea on exertion, edema, orthopnea, palpitations, paroxysmal nocturnal dyspnea. Dermatological: No rash, lesions/masses Respiratory: No cough, dyspnea Urologic: No hematuria, dysuria Abdominal:   No nausea, vomiting, diarrhea, bright red blood per rectum, melena, or hematemesis. Positive for mild abdominal pain.  Neurologic:  No visual changes, wkns, changes in mental status.  All other systems reviewed and are otherwise negative except as noted above.  Physical Exam/Data    Blood pressure 106/65, pulse (!) 52, temperature (!) 97.4 F (36.3 C), resp. rate 12, height 6' (1.829 m), weight 174 lb (78.9 kg), SpO2 97 %.  General: Pleasant, Caucasian male appearing in NAD Psych: Normal affect. Neuro: Alert and oriented X 3. Moves all extremities spontaneously. HEENT: Normal  Neck: Supple without bruits or JVD. Lungs:  Resp regular and unlabored, CTA without wheezing or  rales. Heart: RRR no s3, s4, crisp mechanical valve sounds present.  Abdomen: Soft, non-tender, non-distended, BS + x 4.  Extremities: No clubbing, cyanosis or edema. DP/PT/Radials 2+ and equal bilaterally.   EKG:  No new tracings. EKG from 11/07/2016 showed sinus bradycardia, HR 53, with no acute ST or T-wave changes.   Telemetry:  Telemetry was personally reviewed and demonstrates:  Sinus bradycardia, HR in mid-40's to 50's with occasional PAC's.    Labs/Studies     Relevant CV Studies:  Echocardiogram: 06/2016 Study Conclusions  - Left ventricle: The cavity size was normal. Wall thickness was   increased in a pattern of mild LVH. Systolic function was normal.   The estimated ejection fraction was in the range of 60% to  65%.   Wall motion was normal; there were no regional wall motion   abnormalities. Doppler parameters are consistent with abnormal   left ventricular relaxation (grade 1 diastolic dysfunction). - Aortic valve: A mechanical prosthesis was present. There was   trivial regurgitation. - Aortic root: The aortic root was mildly dilated. - Mitral valve: There was mild regurgitation. - Right ventricle: The cavity size was mildly dilated. - Right atrium: The atrium was mildly dilated.  Impressions:  - Normal LV function; mild diastolic dysfunction; s/p AVR with   normal gradients and trace AI; mildly dilated aortic root; mild   MR; mild RAE and RVE; mild TR.  Laboratory Data:  Chemistry  Recent Labs Lab 11/24/16 1340 11/28/16 0900  NA 137 137  K 4.4 4.1  CL 109 109  CO2 21* 24  GLUCOSE 102* 113*  BUN 18 15  CREATININE 1.30* 1.08  CALCIUM 9.1 8.5*  GFRNONAA 52* >60  GFRAA >60 >60  ANIONGAP 7 4*     Recent Labs Lab 11/24/16 1340  PROT 5.9*  ALBUMIN 3.9  AST 23  ALT 20  ALKPHOS 69  BILITOT 1.3*   Hematology  Recent Labs Lab 11/24/16 1340 11/28/16 0900  WBC 3.6* 4.1  RBC 4.33 4.07*  HGB 14.0 13.4  HCT 40.6 38.4*  MCV 93.8 94.3   MCH 32.3 32.9  MCHC 34.5 34.9  RDW 13.2 13.2  PLT 118* 95*   Cardiac EnzymesNo results for input(s): TROPONINI in the last 168 hours. No results for input(s): TROPIPOC in the last 168 hours.  BNPNo results for input(s): BNP, PROBNP in the last 168 hours.  DDimer No results for input(s): DDIMER in the last 168 hours.  Radiology/Studies:  Dg Chest Port 1 View  Result Date: 11/28/2016 CLINICAL DATA:  Postop.  Aortic stent graft. EXAM: PORTABLE CHEST 1 VIEW COMPARISON:  02/26/2012 FINDINGS: Postop changes from aortic valve replacement are noted. The heart is mildly enlarged. Normal vascularity. Visualize lungs are clear. Left costophrenic angle excluded from the study. No pneumothorax. IMPRESSION: Cardiomegaly without edema. Electronically Signed   By: Marybelle Killings M.D.   On: 11/28/2016 09:38   Dg Abd Portable 1v  Result Date: 11/28/2016 CLINICAL DATA:  stent graft placement EXAM: PORTABLE ABDOMEN - 1 VIEW COMPARISON:  CT abdomen and pelvis August 21, 2016 FINDINGS: These in the stent the midline in the expected level of the aorta extending from the wound L4. There is no bowel dilatation or air-fluid level to suggest bowel obstruction. No free air. There is the 6 x 3 mm calcification to the right A1 L2 common the potential calculus. There is a Foley catheter in urinary bladder. IMPRESSION: Stent in the midline of the abdomen, presumably within the aorta. No bowel obstruction or free air. Questionable calculus medial right kidney region. Electronically Signed   By: Lowella Grip III M.D.   On: 11/28/2016 09:39   Assessment & Plan    1. History of Bicuspid Aortic Valve - s/p AVR with St. Jude mechanical valve in 2003 with aortic root replacement. Most recent echo in 06/2016 showed the mechanical prosthesis was functioning normally.  - Coumadin was held for 5 days prior to his procedure and he was not bridged due to no known history of CVA, TIA's, PE, or DVT.  - will review with Dr. Irish Lack, but  would recommend restarting Coumadin (scheduled to receive 5mg  at 1800) with Heparin bridge once safe from a surgical perspective as anticoagulation has already been held for 5 days.  Likely does not need to be therapeutic prior to discharge but should be trending toward goal of > 2.0.  2. 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  - underwent surgical repair by Dr. Scot Dock with placement of a 21 mm x 21 mm x 10 cm CTAG device. Arteriogram showed no evidence of a Type 1 endoleak but there was a small Type 2 endoleak.  - per Vascular Surgery.   3. HTN - BP stable at 106/65 - 142/79 since admission.  - continue PTA Amlodipine 5mg  daily.   Signed, Erma Heritage, PA-C 11/28/2016, 10:35 AM Pager: [phone]  I have examined the patient and reviewed assessment and plan and discussed with patient.  Agree with above as stated.  Patient with St. Jude Aortic valve.  Crisp S2 click.  THis appears to be functioning well.  If safe from a femoral access standpoint, would start IV heparin drip for the time he is in the hospital.  Start COumadin tonight.  He takes 7.5 mg daily, so would give that dose.  He has been off of Coumadin for 5 days.  OK to be off for 7 days consecultively, so if heparin can be given safely while here, that would be fine.  WOuld not do Lovenox bridge given that it cannot be reversed easily or quickly.  He will need an INR check in the office next week.   Larae Grooms

## 2016-11-28 NOTE — Anesthesia Postprocedure Evaluation (Signed)
Anesthesia Post Note  Patient: Patrick [name]  Procedure(s) Performed: ABDOMINAL AORTIC ENDOVASCULAR STENT GRAFT GORE ONE PIECE STENT (N/A )     Patient location during evaluation: PACU Anesthesia Type: General Level of consciousness: awake and alert Pain management: pain level controlled Vital Signs Assessment: post-procedure vital signs reviewed and stable Respiratory status: spontaneous breathing, nonlabored ventilation and respiratory function stable Cardiovascular status: blood pressure returned to baseline and stable Postop Assessment: no apparent nausea or vomiting Anesthetic complications: no    Last Vitals:  Vitals:   11/28/16 0945 11/28/16 1000  BP: 106/68 106/65  Pulse: (!) 53 (!) 52  Resp: 12 12  Temp:    SpO2:      Last Pain:  Vitals:   11/28/16 1012  TempSrc:   PainSc: Asleep    LLE Motor Response: Purposeful movement;Responds to commands (11/28/16 1000) LLE Sensation: Full sensation (11/28/16 1000) RLE Motor Response: Purposeful movement;Responds to commands (11/28/16 1000) RLE Sensation: Full sensation (11/28/16 1000)      Audry Pili

## 2016-11-28 NOTE — Op Note (Signed)
    NAME: Patrick [name]    MRN: [id number] DOB: 1940/04/21    DATE OF OPERATION: 11/28/2016  PREOP DIAGNOSIS:    Saccular aneurysm of infrarenal abdominal aorta  POSTOP DIAGNOSIS:    Same  PROCEDURE:    Ultrasound-guided access to the right common femoral artery Perclose right common femoral artery 2 Aortogram Same Endovascular repair of saccular aneurysm infrarenal abdominal aorta  SURGEON: Judeth Cornfield. Scot Dock, MD, FACS  ASSIST: Benjamine Sprague PA  ANESTHESIA:  Gen.   EBL:  Minimal  INDICATIONS:    Patrick [name] is a 76 y.o. male  who was found to have a saccular aneurysm of his infrarenal aorta. He presents for elective repair.  FINDINGS:   Completion arteriogram shows a graft in excellent position with no evidence of type I endoleak.. There was a small type II endoleak.  TECHNIQUE:    The patient was taken to the operating room and received a general anesthetic. Both groins and the abdomen were prepped and draped in usual sterile fashion. Under ultrasound guidance, after the skin was anesthetized, the right common femoral artery was cannulated with a micropuncture needle and a micropuncture sheath introduced over a wire. I then advanced a Benson wire through the micropuncture sheath. The tract over the wire was dilated with an 8 [place] dilator. 2 Perclose devices were then deployed. The initial device was rotated 15 medially. The second device was rotated 15 laterally. Next an 8 French sheath was placed.  I then exchanged the [facility] wire for a Amplatz wire using a Omni Flush catheter. The 8 French sheath was then removed and an 76 French sheath placed over the wire and positioned above the aortic bifurcation. I then used a buddy wire system to place the Omni flush catheter at the level of the renal arteries. An arteriotomy was obtained to demonstrate the location of the renal arteries, the saccular aneurysm, and the aortic bifurcation. The 21 mm x 21 mm x 10 cm CTAG  device was selected and positioned below the renal arteries and above the bifurcation to exclude the saccular aneurysm. This was deployed without difficulty. In pulling system out of the 18 French sheath the distal end of the deployment device fractured and was remaining within the 18 [place] sheath. Therefore this 16 French sheath was removed which contained the fragment and pressure held while a new 18 French sheath was placed over the Amplatz wire.  The sheath was positioned below the graft. Then advanced the wire through the graft and a pigtail catheter was positioned above the graft and completion arteriogram was obtained. This showed an excellent result with the graft in good position. It was positioned below the renal arteries and above the aortic bifurcation. There was a long seal zone both proximally and distally with no evidence of type I endoleak. There was a slight blush in the saccular aneurysm related to a small type II endoleak.  This sheath was removed and pressure held for hemostasis. The 2 previously placed Perclose devices were secured with good hemostasis. The wire was then removed and again the sutures secured. The sutures were then cut and the skin closed with a 4-0 Vicryl suture. There was good hemostasis. Dermabond was applied. Patient tolerated the procedure well and was transferred to the recovery stable condition. All needle and sponge counts were correct.  Deitra Mayo, MD, FACS [name]  DATE OF DICTATION:   11/28/2016

## 2016-11-28 NOTE — Anesthesia Procedure Notes (Signed)
Arterial Line Insertion Start/End10/06/2016 7:10 AM, 11/28/2016 7:15 AM Performed by: Lerry Cordrey E, CRNA  Lidocaine 1% used for infiltration and patient sedated Right, radial was placed Catheter size: 20 G  Attempts: 1 (inserted by Lerry Paterson, SRNA) Procedure performed without using ultrasound guided technique. Following insertion, dressing applied and Biopatch. Post procedure assessment: normal

## 2016-11-28 NOTE — H&P (Signed)
Patient name: Patrick [name]           MRN: [id number]        DOB: 12/20/1940          Sex: male   REASON FOR ADMISSION:    Saccular abdominal aortic aneurysm. The the consult is requested by Dr. Darlina Guys.  HPI:   Patrick [name] is a pleasant 76 y.o. 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 I have reviewed the records that were sent with the patient. The patient was seen on 09/01/2016. 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 He is maintained on chronic Coumadin therapy.   He had a CT scan recently looking for a kidney stone and an incidental finding was a saccular abdominal aortic aneurysm. The patient denies any abdominal pain or back pain. Of note, no renal or ureteral calculi was identified.  He is unaware of any family history of aneurysmal disease.      Past Medical History:  Diagnosis Date  . Anticoagulant long-term use   . 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   Last echo 02/2009: EF 55-60%, Normal appearing "tissue valve" with trivial periprosthetic AR, mild MR, mild LAE  . BPH (benign prostatic hyperplasia)   . Bradycardia    Beta blocker discontinued  . Diverticulosis   . HTN (hypertension)   . Migraine headache   . S/P cardiac cath    [facility] in 2003 demonstrated normal coronary arteries         Family History  Problem Relation Age of Onset  . Cancer Mother        ovarian  . CAD Father   . Colon cancer Neg Hx     SOCIAL HISTORY: He quit tobacco in 1977. Social History        Social History  . Marital status: Married    Spouse name: N/A  . Number of children: 2  . Years of education: N/A       Occupational History  . [employer] Employ[employer]         Social History Main Topics  . Smoking  status: Former Smoker    Packs/day: 1.00    Years: 10.00    Types: Cigarettes    Quit date: 08/08/1975  . Smokeless tobacco: Never Used  . Alcohol use 0.0 oz/week     Comment: ONLY DRINK ON FRIDAYS  . Drug use: No  . Sexual activity: Yes       Other Topics Concern  . Not on file      Social History Narrative  . No narrative on file         Allergies  Allergen Reactions  . Codeine Rash    rash          Current Outpatient Prescriptions  Medication Sig Dispense Refill  . amLODipine (NORVASC) 5 MG tablet Take 1 tablet (5 mg total) by mouth daily. 30 tablet 11  . amoxicillin (AMOXIL) 500 MG capsule take 1 capsule by mouth four times a day 20 capsule 3  . atorvastatin (LIPITOR) 10 MG tablet take 1/2 tablet by mouth once daily 30 tablet 2  . desonide (DESOWEN) 0.05 % cream Apply 1 application topically daily as needed. 60 g 3  . pantoprazole (PROTONIX) 40  MG tablet Take 1 tablet (40 mg total) by mouth daily. 90 tablet 3  . STAXYN 10 MG TBDP Take 10 mg by mouth daily as needed.   0  . SUMAtriptan (IMITREX) 50 MG tablet take 1/2 tablet by mouth every 2 hours if needed as directed 9 tablet 3  . tadalafil (CIALIS) 5 MG tablet Take 1 tablet (5 mg total) by mouth daily as needed for erectile dysfunction. 90 tablet 0  . warfarin (COUMADIN) 7.5 MG tablet take 1 tablet by mouth once daily 90 tablet 1   No current facility-administered medications for this visit.     REVIEW OF SYSTEMS:  [X]  denotes positive finding, [ ]  denotes negative finding Cardiac  Comments:  Chest pain or chest pressure:    Shortness of breath upon exertion:    Short of breath when lying flat:    Irregular heart rhythm:        Vascular    Pain in calf, thigh, or hip brought on by ambulation:    Pain in feet at night that wakes you up from your sleep:     Blood clot in your veins:    Leg swelling:         Pulmonary    Oxygen at home:    Productive  cough:     Wheezing:         Neurologic    Sudden weakness in arms or legs:     Sudden numbness in arms or legs:     Sudden onset of difficulty speaking or slurred speech:    Temporary loss of vision in one eye:     Problems with dizziness:         Gastrointestinal    Blood in stool:     Vomited blood:         Genitourinary    Burning when urinating:     Blood in urine:        Psychiatric    Major depression:         Hematologic    Bleeding problems:    Problems with blood clotting too easily:        Skin    Rashes or ulcers:        Constitutional    Fever or chills:     PHYSICAL EXAM:   Vitals:   11/28/16 0603  BP: (!) 142/79  Pulse: (!) 57  Resp: 20  Temp: 97.9 F (36.6 C)  SpO2: 98%   GENERAL: The patient is a well-nourished male, in no acute distress. The vital signs are documented above. CARDIAC: There is a regular rate and rhythm.  VASCULAR: I do not detect carotid bruits. He has palpable femoral, popliteal, and pedal pulses bilaterally. PULMONARY: There is good air exchange bilaterally without wheezing or rales. ABDOMEN: Soft and non-tender with normal pitched bowel sounds. I cannot palpate his aneurysm. MUSCULOSKELETAL: There are no major deformities or cyanosis. NEUROLOGIC: No focal weakness or paresthesias are detected. SKIN: There are no ulcers or rashes noted. PSYCHIATRIC: The patient has a normal affect.  DATA:    CT ANGIOGRAM ABDOMEN PELVIS: I have reviewed his CT angiogram which shows a focal saccular aneurysm in the mid infrarenal aorta on the left lateral side. The aorta above and below this is not dilated.  ECHO: I have reviewed his echo that was done on 06/30/2016. This showed an ejection fraction of 60-65%. He had normal LV function.  MEDICAL ISSUES:   SACCULAR INFRARENAL ABDOMINAL AORTIC ANEURYSM: Although  the patient's aneurysm is very small, this is a focal  saccular aneurysm which I think needs to be addressed given the risk of rupture. I think he could likely be addressed with an endovascular graft using one piece. I have discussed preoperative cardiac clearance and a Coumadin bridge with Dr. Angelena Form.  We have reviewed the indications for repair and the potential complications. All of his questions were answered and he is agreeable to proceed.   Deitra Mayo

## 2016-11-28 NOTE — Anesthesia Procedure Notes (Addendum)
Procedure Name: Intubation Date/Time: 11/28/2016 7:41 AM Performed by: Sampson Si E Pre-anesthesia Checklist: Patient identified, Suction available and Patient being monitored Patient Re-evaluated:Patient Re-evaluated prior to induction Oxygen Delivery Method: Circle system utilized Preoxygenation: Pre-oxygenation with 100% oxygen Induction Type: IV induction Ventilation: Mask ventilation without difficulty Laryngoscope Size: Mac and 4 Grade View: Grade II Tube type: Oral Tube size: 7.5 mm Number of attempts: 1 Airway Equipment and Method: Stylet and Oral airway Placement Confirmation: ETT inserted through vocal cords under direct vision,  positive ETCO2 and breath sounds checked- equal and bilateral Secured at: 22 cm Tube secured with: Tape Dental Injury: Teeth and Oropharynx as per pre-operative assessment  Comments: Intubation performed by Lerry Paterson, SRNA

## 2016-11-28 NOTE — Progress Notes (Addendum)
[facility] for heparin  Indication: mechanical AVR  Allergies  Allergen Reactions  . Codeine Rash    Patient Measurements: Height: 6' (182.9 cm) Weight: 174 lb (78.9 kg) IBW/kg (Calculated) : 77.6  Vital Signs: Temp: 97.5 F (36.4 C) (10/05 1300) Temp Source: Oral (10/05 0603) BP: 108/68 (10/05 1300) Pulse Rate: 55 (10/05 1300)  Labs:  Recent Labs  11/28/16 0612 11/28/16 0900  HGB  --  13.4  HCT  --  38.4*  PLT  --  95*  APTT  --  38*  LABPROT 14.1 14.9  INR 1.10 1.18  CREATININE  --  1.08    Estimated Creatinine Clearance: 65.9 mL/min (by C-G formula based on SCr of 1.08 mg/dL).   Medical History: Past Medical History:  Diagnosis Date  . Anticoagulant long-term use   . Aortic insufficiency    a. 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  b. 06/2016: echo showed EF of 60-65%, Grade 1 DD, and normal functioning of the mechanical aortic prosthesis.  Marland Kitchen BPH (benign prostatic hyperplasia)   . Bradycardia    Beta blocker discontinued  . Diverticulosis   . HTN (hypertension)   . Migraine headache   . S/P cardiac cath    [facility] in 2003 demonstrated normal coronary arteries     Assessment: 76 yo male on warfarin PTA mechanical AVR. Warfarin held 5 days prior to planned vascular procedure. S/p sx for saccular infrarenal abdominal aortic aneurysm on 10/5. Per vascular start heparin infusion this evening at 15:00, warfarin also to resume tonight with dosing per vascular.  PTA dosing: 7.5 mg daily   Goal of Therapy:  INR 2-3 Heparin level 0.3-0.5 units/ml Monitor platelets by anticoagulation protocol: Yes   Plan:  1. Begin heparin infusion at 1200 units/hr at 15:00 this evening  2. Heparin level 8 hours after staring heparin drip  3. Warfarin dosing per MD 4. Daily INR's ordered   Vincenza Hews, PharmD, BCPS 11/28/2016, 1:37 PM

## 2016-11-29 DIAGNOSIS — Z5181 Encounter for therapeutic drug level monitoring: Secondary | ICD-10-CM

## 2016-11-29 DIAGNOSIS — Z7901 Long term (current) use of anticoagulants: Secondary | ICD-10-CM

## 2016-11-29 LAB — CBC
HEMATOCRIT: 36.8 % — AB (ref 39.0–52.0)
HEMOGLOBIN: 12.8 g/dL — AB (ref 13.0–17.0)
MCH: 33 pg (ref 26.0–34.0)
MCHC: 34.8 g/dL (ref 30.0–36.0)
MCV: 94.8 fL (ref 78.0–100.0)
Platelets: 106 10*3/uL — ABNORMAL LOW (ref 150–400)
RBC: 3.88 MIL/uL — AB (ref 4.22–5.81)
RDW: 13.1 % (ref 11.5–15.5)
WBC: 7.8 10*3/uL (ref 4.0–10.5)

## 2016-11-29 LAB — BASIC METABOLIC PANEL
Anion gap: 8 (ref 5–15)
BUN: 16 mg/dL (ref 6–20)
CHLORIDE: 106 mmol/L (ref 101–111)
CO2: 22 mmol/L (ref 22–32)
CREATININE: 1.19 mg/dL (ref 0.61–1.24)
Calcium: 8.8 mg/dL — ABNORMAL LOW (ref 8.9–10.3)
GFR calc non Af Amer: 58 mL/min — ABNORMAL LOW (ref >60)
Glucose, Bld: 169 mg/dL — ABNORMAL HIGH (ref 65–99)
POTASSIUM: 4.1 mmol/L (ref 3.5–5.1)
Sodium: 136 mmol/L (ref 135–145)

## 2016-11-29 LAB — PROTIME-INR
INR: 1.22
Prothrombin Time: 15.3 seconds — ABNORMAL HIGH (ref 11.4–15.2)

## 2016-11-29 LAB — HEPARIN LEVEL (UNFRACTIONATED)
Heparin Unfractionated: 0.54 IU/mL (ref 0.30–0.70)
Heparin Unfractionated: 0.59 IU/mL (ref 0.30–0.70)

## 2016-11-29 NOTE — Progress Notes (Signed)
[facility] for heparin  Indication: mechanical AVR  Allergies  Allergen Reactions  . Codeine Rash    Patient Measurements: Height: 6' (182.9 cm) Weight: 174 lb (78.9 kg) IBW/kg (Calculated) : 77.6  Vital Signs: Temp: 97.9 F (36.6 C) (10/05 2000) Temp Source: Axillary (10/05 2000) BP: 100/60 (10/06 0000) Pulse Rate: 53 (10/06 0000)  Labs:  Recent Labs  11/28/16 0612 11/28/16 0900 11/29/16 0004  HGB  --  13.4  --   HCT  --  38.4*  --   PLT  --  95*  --   APTT  --  38*  --   LABPROT 14.1 14.9  --   INR 1.10 1.18  --   HEPARINUNFRC  --   --  0.54  CREATININE  --  1.08  --     Estimated Creatinine Clearance: 65.9 mL/min (by C-G formula based on SCr of 1.08 mg/dL).   Assessment: 76 yo male on warfarin PTA mechanical AVR. Warfarin held 5 days prior to planned vascular procedure. S/p sx for saccular infrarenal abdominal aortic aneurysm on 10/5. Per vascular start heparin infusion this evening at 15:00, warfarin also to resume tonight with dosing per vascular.  PTA dosing: 7.5 mg daily   Initial hep lvl within goal  Goal of Therapy:  INR 2-3 Heparin level 0.3-0.5 units/ml Monitor platelets by anticoagulation protocol: Yes   Plan:  1. Continue heparin infusion 1200 units/hr 2. Warfarin dosing per MD 3. Daily INR HL  Levester Fresh, PharmD, BCPS, BCCCP Clinical Pharmacist Clinical phone for 11/29/2016 from 7a-3:30p: [phone] If after 3:30p, please call main pharmacy at: [phone] 11/29/2016 12:51 AM

## 2016-11-29 NOTE — Progress Notes (Signed)
Subjective: Interval History: none.. Comfortable. Has voided after Foley out.   Objective: Vital signs in last 24 hours: Temp:  [97.5 F (36.4 C)-97.9 F (36.6 C)] 97.7 F (36.5 C) (10/06 0454) Pulse Rate:  [39-75] 45 (10/06 0500) Resp:  [12-23] 13 (10/06 0500) BP: (100-145)/(55-97) 118/64 (10/06 0500) SpO2:  [93 %-97 %] 95 % (10/06 0500) Arterial Line BP: (136-156)/(60-73) 136/63 (10/05 1300) Weight:  [173 lb 8 oz (78.7 kg)] 173 lb 8 oz (78.7 kg) (10/06 0454)  Intake/Output from previous day: 10/05 0701 - 10/06 0700 In: 950 [I.V.:600] Out: 2350 [Urine:2300; Blood:50] Intake/Output this shift: Total I/O In: 120 [P.O.:120] Out: -   Right groin without hematoma. Palpable popliteal and dorsalis pedis pulses bilaterally. Abdomen benign  Lab Results:  Recent Labs  11/28/16 0900 11/29/16 0309  WBC 4.1 7.8  HGB 13.4 12.8*  HCT 38.4* 36.8*  PLT 95* 106*   BMET  Recent Labs  11/28/16 0900 11/29/16 0309  NA 137 136  K 4.1 4.1  CL 109 106  CO2 24 22  GLUCOSE 113* 169*  BUN 15 16  CREATININE 1.08 1.19  CALCIUM 8.5* 8.8*    Studies/Results: Ct Head Wo Contrast  Result Date: 11/11/2016 CLINICAL DATA:  Near syncope, blurred vision. EXAM: CT HEAD WITHOUT CONTRAST TECHNIQUE: Contiguous axial images were obtained from the base of the skull through the vertex without intravenous contrast. COMPARISON:  None. FINDINGS: Brain: No evidence of acute infarction, hemorrhage, hydrocephalus, extra-axial collection or mass lesion/mass effect. Vascular: No hyperdense vessel or unexpected calcification. Skull: Normal. Negative for fracture or focal lesion. Sinuses/Orbits: No acute finding. Other: None. IMPRESSION: Normal head CT. Electronically Signed   By: Marijo Conception, M.D.   On: 11/11/2016 11:01   Dg Chest Port 1 View  Result Date: 11/28/2016 CLINICAL DATA:  Postop.  Aortic stent graft. EXAM: PORTABLE CHEST 1 VIEW COMPARISON:  02/26/2012 FINDINGS: Postop changes from aortic valve  replacement are noted. The heart is mildly enlarged. Normal vascularity. Visualize lungs are clear. Left costophrenic angle excluded from the study. No pneumothorax. IMPRESSION: Cardiomegaly without edema. Electronically Signed   By: Marybelle Killings M.D.   On: 11/28/2016 09:38   Dg Abd Portable 1v  Result Date: 11/28/2016 CLINICAL DATA:  stent graft placement EXAM: PORTABLE ABDOMEN - 1 VIEW COMPARISON:  CT abdomen and pelvis August 21, 2016 FINDINGS: These in the stent the midline in the expected level of the aorta extending from the wound L4. There is no bowel dilatation or air-fluid level to suggest bowel obstruction. No free air. There is the 6 x 3 mm calcification to the right A1 L2 common the potential calculus. There is a Foley catheter in urinary bladder. IMPRESSION: Stent in the midline of the abdomen, presumably within the aorta. No bowel obstruction or free air. Questionable calculus medial right kidney region. Electronically Signed   By: Lowella Grip III M.D.   On: 11/28/2016 09:39   Anti-infectives: Anti-infectives    Start     Dose/Rate Route Frequency Ordered Stop   11/28/16 1400  amoxicillin (AMOXIL) capsule 500 mg     500 mg Oral Every 8 hours 11/28/16 1346     11/28/16 0552  dextrose 5 % with cefUROXime (ZINACEF) ADS Med    Comments:  Bobbie Stack   : cabinet override      11/28/16 0552 11/28/16 0745   11/28/16 0547  cefUROXime (ZINACEF) 1.5 g in dextrose 5 % 50 mL IVPB     1.5 g 100 mL/hr over  30 Minutes Intravenous 30 min pre-op 11/28/16 0547 11/28/16 0745      Assessment/Plan: s/p Procedure(s): ABDOMINAL AORTIC ENDOVASCULAR STENT GRAFT GORE ONE PIECE STENT (N/A) Stable for discharge. Follow-up in one month   LOS: 1 day   Sherine Cortese 11/29/2016, 10:52 AM

## 2016-11-29 NOTE — Progress Notes (Signed)
Subjective:  Denies SSCP, palpitations or Dyspnea Going home per [facility] heparin stopped   Objective:  Vitals:   11/29/16 0300 11/29/16 0400 11/29/16 0454 11/29/16 0500  BP: 105/66 (!) 105/59 (!) 145/67 118/64  Pulse: (!) 48 (!) 51 (!) 39 (!) 45  Resp: 16 13 14 13   Temp:   97.7 F (36.5 C)   TempSrc:   Oral   SpO2: 96% 95% 96% 95%  Weight:   173 lb 8 oz (78.7 kg)   Height:        Intake/Output from previous day:  Intake/Output Summary (Last 24 hours) at 11/29/16 1049 Last data filed at 11/29/16 0900  Gross per 24 hour  Intake              470 ml  Output             2150 ml  Net            -1680 ml    Physical Exam: Affect appropriate Healthy:  appears stated age HEENT: normal Neck supple with no adenopathy JVP normal no bruits no thyromegaly Lungs clear with no wheezing and good diaphragmatic motion Heart:  Z5/G3 click crisp  no murmur, no rub, gallop or click PMI normal Abdomen: benighn, BS positve, no tenderness, no AAA no bruit.  No HSM or HJR RFA A no hematoma  No edema Neuro non-focal Skin warm and dry No muscular weakness   Lab Results: Basic Metabolic Panel:  Recent Labs  11/28/16 0900 11/29/16 0309  NA 137 136  K 4.1 4.1  CL 109 106  CO2 24 22  GLUCOSE 113* 169*  BUN 15 16  CREATININE 1.08 1.19  CALCIUM 8.5* 8.8*  MG 1.9  --    Liver Function Tests: No results for input(s): AST, ALT, ALKPHOS, BILITOT, PROT, ALBUMIN in the last 72 hours. No results for input(s): LIPASE, AMYLASE in the last 72 hours. CBC:  Recent Labs  11/28/16 0900 11/29/16 0309  WBC 4.1 7.8  HGB 13.4 12.8*  HCT 38.4* 36.8*  MCV 94.3 94.8  PLT 95* 106*    Imaging: Dg Chest Port 1 View  Result Date: 11/28/2016 CLINICAL DATA:  Postop.  Aortic stent graft. EXAM: PORTABLE CHEST 1 VIEW COMPARISON:  02/26/2012 FINDINGS: Postop changes from aortic valve replacement are noted. The heart is mildly enlarged. Normal vascularity. Visualize lungs are clear. Left  costophrenic angle excluded from the study. No pneumothorax. IMPRESSION: Cardiomegaly without edema. Electronically Signed   By: Marybelle Killings M.D.   On: 11/28/2016 09:38   Dg Abd Portable 1v  Result Date: 11/28/2016 CLINICAL DATA:  stent graft placement EXAM: PORTABLE ABDOMEN - 1 VIEW COMPARISON:  CT abdomen and pelvis August 21, 2016 FINDINGS: These in the stent the midline in the expected level of the aorta extending from the wound L4. There is no bowel dilatation or air-fluid level to suggest bowel obstruction. No free air. There is the 6 x 3 mm calcification to the right A1 L2 common the potential calculus. There is a Foley catheter in urinary bladder. IMPRESSION: Stent in the midline of the abdomen, presumably within the aorta. No bowel obstruction or free air. Questionable calculus medial right kidney region. Electronically Signed   By: Lowella Grip III M.D.   On: 11/28/2016 09:39    Cardiac Studies:  ECG: SB rate 60 no acute ST changes    Telemetry: SR no arrhythmia   Echo:  06/30/16 EF 60-65% normal mechanical AVR   Medications:   .  amLODipine  5 mg Oral Daily  . amoxicillin  500 mg Oral Q8H  . atorvastatin  10 mg Oral QHS  . docusate sodium  100 mg Oral Daily  . pantoprazole  40 mg Oral Daily  . Warfarin - Physician Dosing Inpatient   Does not apply q1800     . sodium chloride    . sodium chloride 125 mL/hr at 11/29/16 0652  . heparin 1,200 Units/hr (11/28/16 1542)  . magnesium sulfate 1 - 4 g bolus IVPB      Assessment/Plan:  Anticoagulation: no need for lovenox bridge per JV/CM. AVR with no PaF or previous stroke resume home coumadin dose. Has INR check at our office 10/12 AVR:  Normal exam and normal function by recent echo AAA: post EVAR RFA A with no hematoma f/u Patrick [name] [facility] 11/29/2016, 10:49 AM

## 2016-11-30 ENCOUNTER — Encounter (HOSPITAL_COMMUNITY): Payer: Self-pay | Admitting: Vascular Surgery

## 2016-12-01 ENCOUNTER — Encounter: Payer: Self-pay | Admitting: Internal Medicine

## 2016-12-03 IMAGING — US US ABDOMEN COMPLETE
1 series · 13 of 25 positions shown · non-contrast
Comparison: CT scan of March 10, 2006.

CLINICAL DATA: Chronic epigastric abdominal pain.

EXAM:
ABDOMEN ULTRASOUND COMPLETE

[Series 1: us abdomen complete · 0.24mm/px · 13 of 87 slices shown]
[im 1/87]
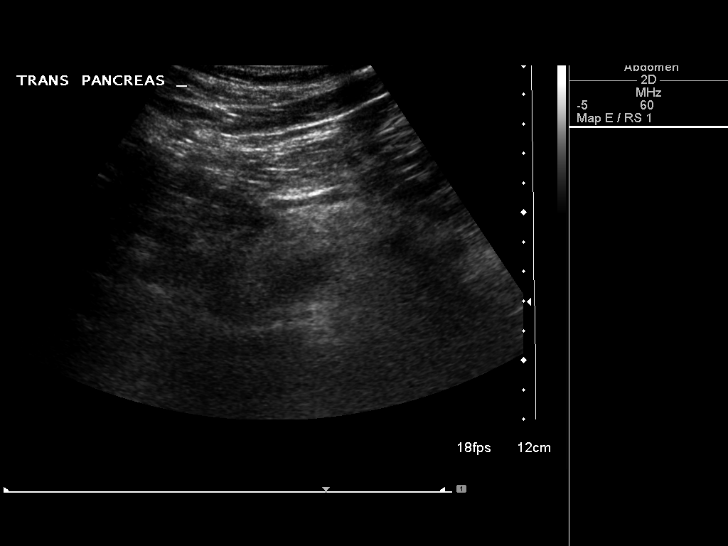
[im 8/87]
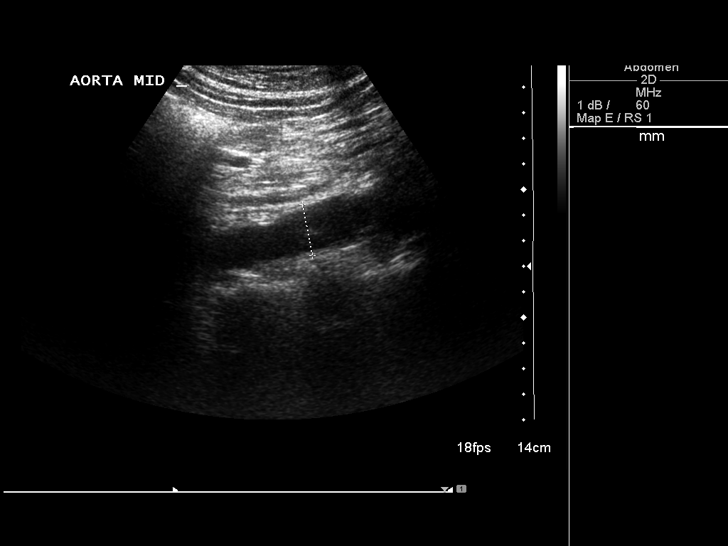
[im 15/87]
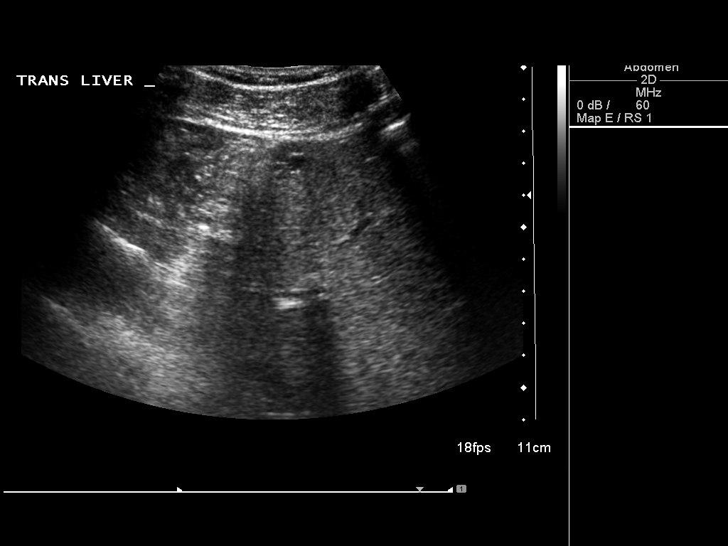
[im 22/87]
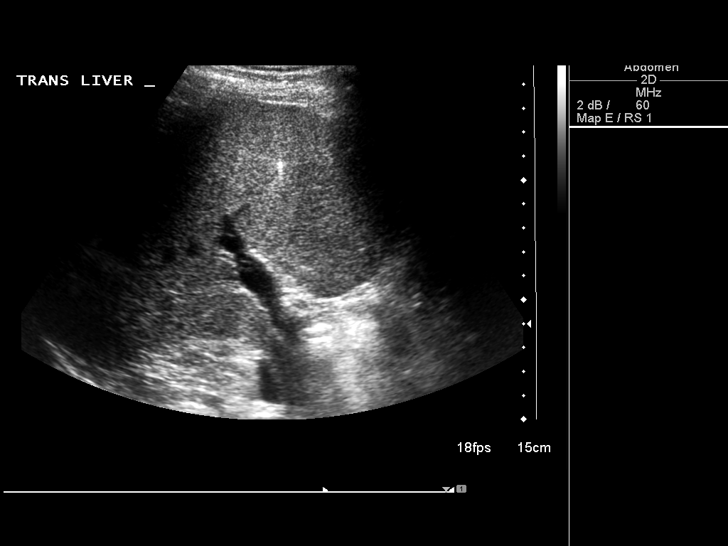
[im 29/87]
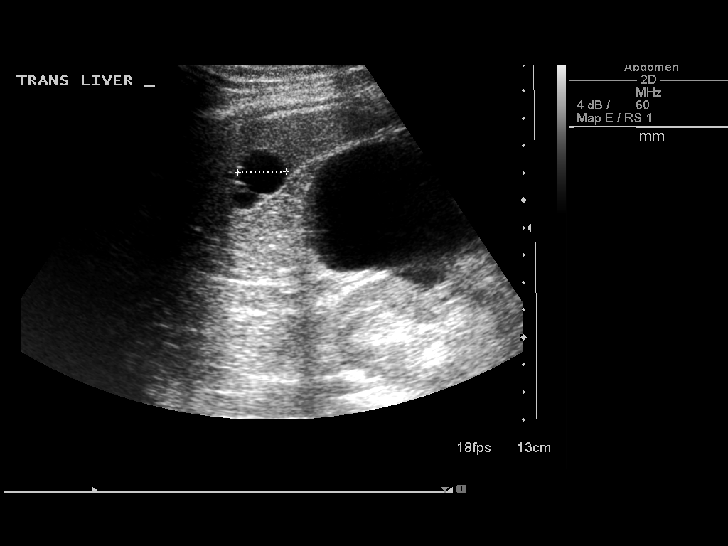
[im 36/87]
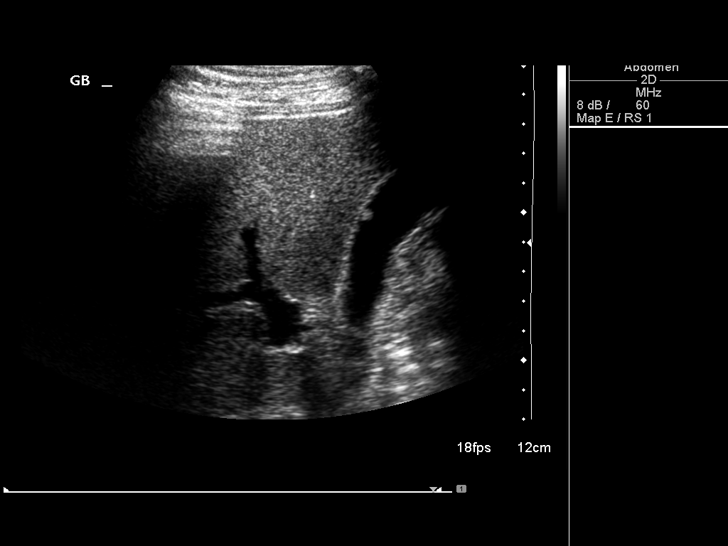
[im 44/87]
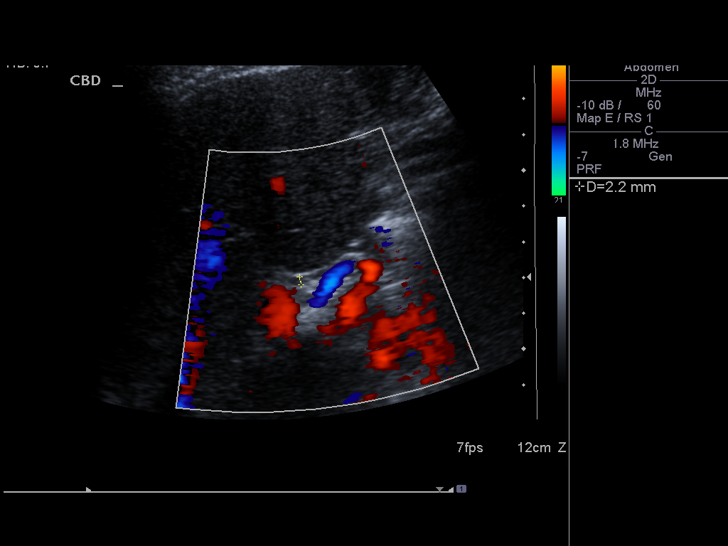
[im 51/87]
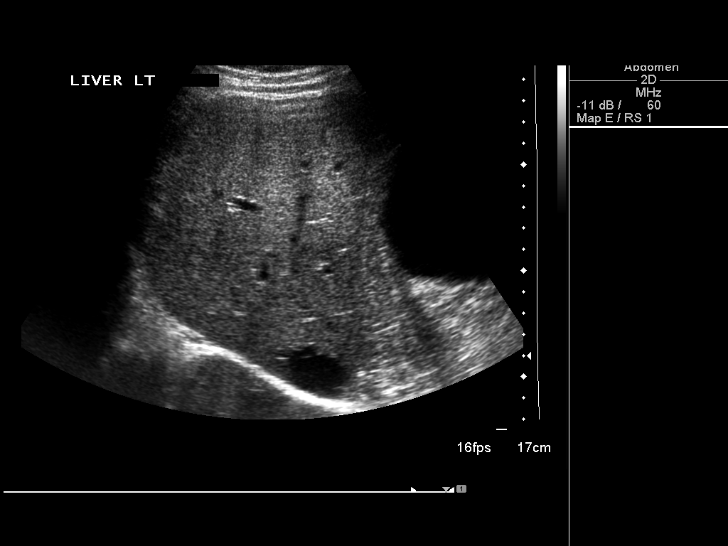
[im 58/87]
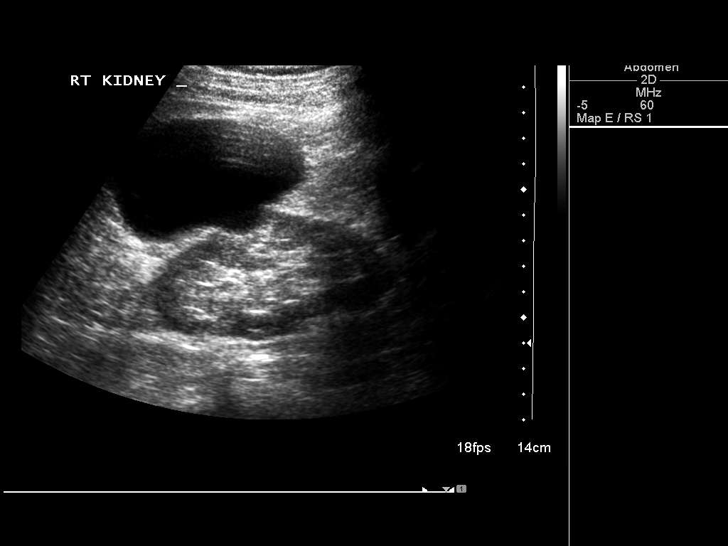
[im 65/87]
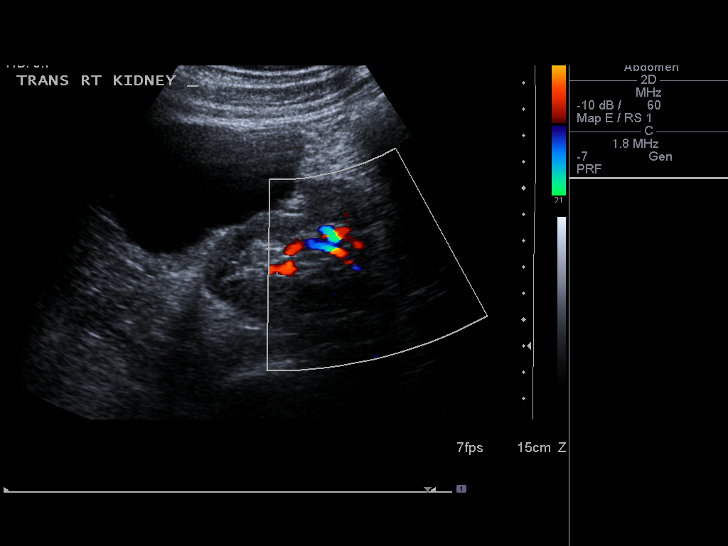
[im 72/87]
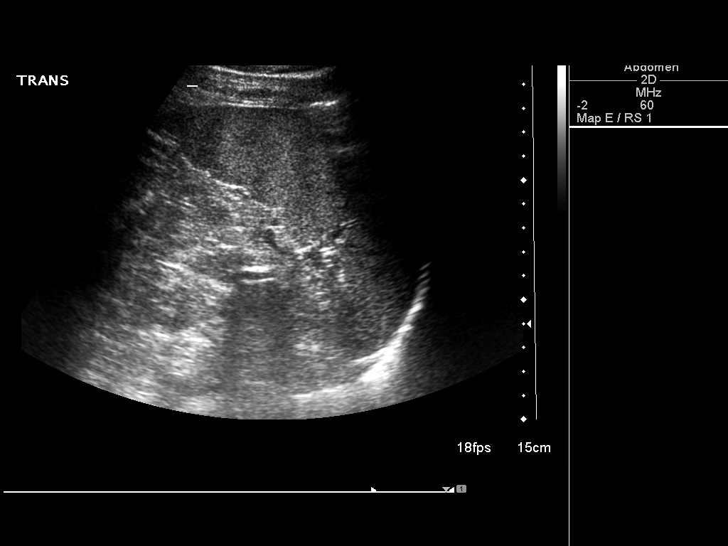
[im 79/87]
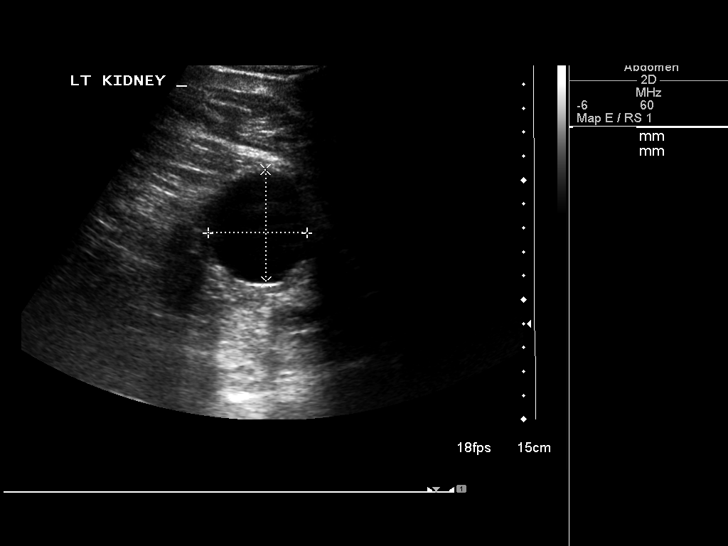
[im 87/87]
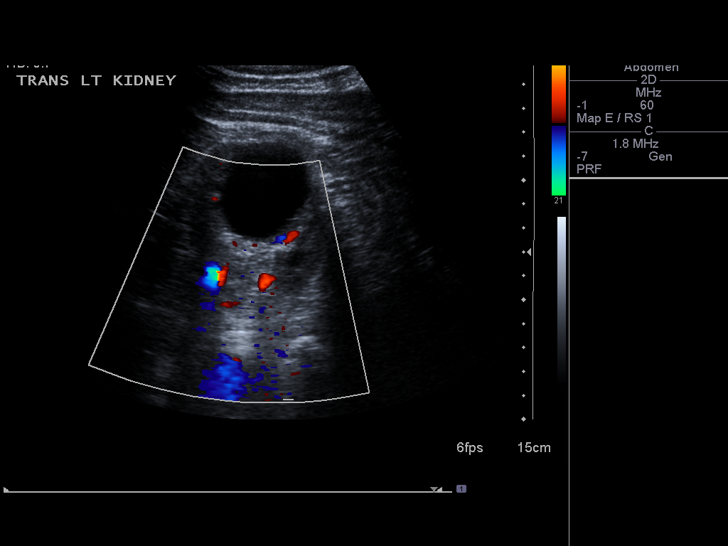

[13 of 25 positions shown; findings below may reference images not displayed]

FINDINGS: Gallbladder: 2 non mobile echogenic foci are noted which are non
shadowing, most consistent with small polyps. These measure 4 mm. No
definite gallstones or gallbladder wall thickening is noted. No
pericholecystic fluid is noted. No sonographic Murphy's sign is
noted.

Common bile duct: Diameter: 2.2 mm which is within normal limits.

Liver: Multiple cysts are noted, with the largest measuring 4.8 cm
in the right hepatic lobe. Within normal limits in parenchymal
echogenicity.

IVC: No abnormality visualized.

Pancreas: Visualized portion unremarkable.

Spleen: Size and appearance within normal limits.

Right Kidney: Length: 10.4 cm. Multiple simple cysts are noted with
the largest measuring 9 cm. Echogenicity within normal limits. No
mass or hydronephrosis visualized.

Left Kidney: Length: 11.5 cm. 4.7 cm simple cyst is noted in
midpole. Echogenicity within normal limits. No mass or
hydronephrosis visualized.

Abdominal aorta: No aneurysm visualized.

Other findings: None.
IMPRESSION: Simple hepatic and bilateral renal cysts are noted. 2 small
gallbladder polyps are noted. No other significant abnormality seen
in the abdomen.

## 2016-12-05 ENCOUNTER — Ambulatory Visit (INDEPENDENT_AMBULATORY_CARE_PROVIDER_SITE_OTHER): Payer: Medicare Other | Admitting: *Deleted

## 2016-12-05 DIAGNOSIS — I359 Nonrheumatic aortic valve disorder, unspecified: Secondary | ICD-10-CM | POA: Diagnosis not present

## 2016-12-05 DIAGNOSIS — I712 Thoracic aortic aneurysm, without rupture, unspecified: Secondary | ICD-10-CM

## 2016-12-05 DIAGNOSIS — Z5181 Encounter for therapeutic drug level monitoring: Secondary | ICD-10-CM

## 2016-12-05 LAB — POCT INR: INR: 1.7

## 2016-12-08 ENCOUNTER — Telehealth: Payer: Self-pay | Admitting: Vascular Surgery

## 2016-12-08 NOTE — Telephone Encounter (Signed)
Sched appts 12/31/16; CTA at 12:00 at [place]. MD at 1:00. Lm on hm#.

## 2016-12-08 NOTE — Telephone Encounter (Signed)
-----   Message from Mena Goes, RN sent at 11/28/2016  9:32 AM EDT ----- Regarding: 1 month CTA Abdomen Pelvis and CSD appt   ----- Message ----- From: Angelia Mould, MD Sent: 11/28/2016   9:04 AM To: [facility] Charge Pool Subject: charge and f/u                                  PROCEDURE:   Ultrasound-guided access to the right common femoral artery Perclose right common femoral artery 2 Aortogram Same Endovascular repair of saccular aneurysm infrarenal abdominal aorta  SURGEON: Judeth Cornfield. Scot Dock, MD, FACS  ASSIST: Benjamine Sprague PA  He will need a CT scan (CT angiogram abdomen pelvis) in 1 month with a follow up visit at that time. Thank you. CD  Cammy Brochure codes were [phone], [phone]

## 2016-12-09 NOTE — Discharge Summary (Signed)
[facility] Discharge Summary   Patient ID:  Patrick [name] MRN: [id number] DOB/AGE: 30-Jan-1941 76 y.o.  Admit date: 11/28/2016 Discharge date:11/29/2016 Date of Surgery: 11/28/2016 Surgeon: Surgeon(s): Angelia Mould, MD  Admission Diagnosis: aortic aneurysm  Discharge Diagnoses:  aortic aneurysm  Secondary Diagnoses: Past Medical History:  Diagnosis Date  . Anticoagulant long-term use   . Aortic insufficiency    a. 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  b. 06/2016: echo showed EF of 60-65%, Grade 1 DD, and normal functioning of the mechanical aortic prosthesis.  Marland Kitchen BPH (benign prostatic hyperplasia)   . Bradycardia    Beta blocker discontinued  . Diverticulosis   . HTN (hypertension)   . Migraine headache   . S/P cardiac cath    [facility] in 2003 demonstrated normal coronary arteries    Procedure(s): ABDOMINAL AORTIC ENDOVASCULAR STENT GRAFT GORE ONE PIECE STENT  Discharged Condition: good  HPI:  REASON FOR ADMISSION:   Saccular abdominal aortic aneurysm. The the consult is requested by Dr. Darlina Guys.  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 I have reviewed the records that were sent with the patient. The patient was seen on 09/01/2016. 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 He is maintained on chronic Coumadin therapy.   He had a CT scan recently looking for a kidney stone and anincidental finding was a saccular abdominal aortic aneurysm. The patient denies any abdominal pain or back pain. Of note, no renal or ureteral calculi was identified.  He is unaware of any family history of aneurysmal disease.    Hospital Course:  Patrick [name] is a 76 y.o. male is S/P Procedure(s): ABDOMINAL  AORTIC ENDOVASCULAR STENT GRAFT GORE ONE PIECE STENT Will give one dose of Coumadin 5 mg tonight.  Patient has mechanical aortic valve.  Pending cardiology note if he needs to be bridged with Lovenox or just restart normal Coumadin dose plan for discharge home tomorrow.      Instruction from DR. [facility] Cardiology consult called for instruction on Lovenox verses resume Coumadin dose.   [name] PA-C  POD#1 Right groin without hematoma. Palpable popliteal and dorsalis pedis pulses bilaterally. Abdomen benign.   Stable for discharge uneventful stay over night.      Consults:  Treatment Team:  Lbcardiology, Rounding, MD  Significant Diagnostic Studies: CBC Lab Results  Component Value Date   WBC 7.8 11/29/2016   HGB 12.8 (L) 11/29/2016   HCT 36.8 (L) 11/29/2016   MCV 94.8 11/29/2016   PLT 106 (L) 11/29/2016    BMET    Component Value Date/Time   NA 136 11/29/2016 0309   K 4.1 11/29/2016 0309   CL 106 11/29/2016 0309   CO2 22 11/29/2016 0309   GLUCOSE 169 (H) 11/29/2016 0309   GLUCOSE 96 01/12/2006 1138   BUN 16 11/29/2016 0309   CREATININE 1.19 11/29/2016 0309   CALCIUM 8.8 (L) 11/29/2016 0309   GFRNONAA 58 (L) 11/29/2016 0309   GFRAA >60 11/29/2016 0309   COAG Lab Results  Component Value Date   INR 1.7 12/05/2016   INR 1.22 11/29/2016   INR 1.18 11/28/2016   PROTIME 20.2 08/11/2008   PROTIME 19.1 07/21/2008     Disposition:  Discharge to :Home  Discharge Instructions    Call MD for:  redness, tenderness, or signs of infection (pain, swelling, bleeding, redness, odor or green/yellow discharge around incision site)    Complete by:  As directed    Call MD for:  severe or increased pain, loss or decreased feeling  in affected limb(s)    Complete by:  As directed    Call MD for:  temperature >100.5    Complete by:  As directed    Discharge instructions    Complete by:  As directed    You may shower in 24 hours.  Follow up with coumadin  clinic as instructed.   Driving Restrictions    Complete by:  As directed    No driving for 1 week   Lifting restrictions    Complete by:  As directed    No heavy lifting for 3-4 weeks   Resume previous diet    Complete by:  As directed      Allergies as of 11/29/2016      Reactions   Codeine Rash      Medication List    TAKE these medications   amLODipine 5 MG tablet Commonly known as:  NORVASC Take 1 tablet (5 mg total) by mouth daily.   amoxicillin 500 MG capsule Commonly known as:  AMOXIL take 1 capsule by mouth four times a day What changed:  See the new instructions.   atorvastatin 10 MG tablet Commonly known as:  LIPITOR Take 1 tablet (10 mg total) by mouth daily.   desonide 0.05 % cream Commonly known as:  DESOWEN Apply 1 application topically daily as needed. What changed:  reasons to take this   oxyCODONE-acetaminophen 5-325 MG tablet Commonly known as:  PERCOCET/ROXICET Take 1 tablet by mouth every 6 (six) hours as needed.   pantoprazole 40 MG tablet Commonly known as:  PROTONIX Take 1 tablet (40 mg total) by mouth daily.   SUMAtriptan 50 MG tablet Commonly known as:  IMITREX take 1/2 tablet by mouth every 2 hours if needed as directed   tadalafil 5 MG tablet Commonly known as:  CIALIS Take 1 tablet (5 mg total) by mouth daily as needed for erectile dysfunction. What changed:  reasons to take this   warfarin 7.5 MG tablet Commonly known as:  COUMADIN take 1 tablet by mouth once daily What changed:  See the new instructions.      Verbal and written Discharge instructions given to the patient. Wound care per Discharge AVS Follow-up Information    Angelia Mould, MD Follow up in 4 week(s).   Specialties:  Vascular Surgery, Cardiology Why:  office will call Contact information: [place] [address] [phone]        Burnell Blanks, MD Follow up on 12/05/2016.   Specialty:  Cardiology Why:  Appointment  for Coumadin (INR Check) on 12/05/2016 at 3:00PM.  Contact information: [place] [address] [phone]           Signed: Roxy Horseman 12/09/2016, 4:45 PM - For [facility] Registry use --- Instructions: Press F2 to tab through selections.  Delete question if not applicable.   Post-op:  Time to Extubation: [x ] In OR, [ ]  < 12 hrs, [ ]  12-24 hrs, [ ]  >=24 hrs Vasopressors Req. Post-op: No MI: [x ] No, [ ]  Troponin only, [ ]  EKG or Clinical New Arrhythmia: No CHF: No ICU Stay: 0 days Transfusion: No  If yes, 0  units given  Complications: Resp failure: [x ] none, [ ]  Pneumonia, [ ]  Ventilator Chg in renal function: [x ] none, [ ]  Inc. Cr > 0.5, [ ]  Temp. Dialysis, [ ]  Permanent dialysis Leg ischemia: [x ] No, [ ]  Yes, no Surgery needed, [ ]  Yes, Surgery needed, [ ]  Amputation Bowel ischemia: [x ] No, [ ]  Medical Rx, [ ]  Surgical Rx Wound complication: [x ] No, [ ]  Superficial separation/infection, [ ]  Return to OR Return to OR: No  Return to OR for bleeding: No Stroke: [ ]  None, [ ]  Minor, [ ]  Major  Discharge medications: Statin use:  Yes ASA use:  No  for medical reason   Plavix use:  No  for medical reason   Beta blocker use:  No  for medical reason    Anticoagulation: Coumadin

## 2016-12-19 ENCOUNTER — Ambulatory Visit (INDEPENDENT_AMBULATORY_CARE_PROVIDER_SITE_OTHER): Payer: Medicare Other | Admitting: *Deleted

## 2016-12-19 DIAGNOSIS — Z5181 Encounter for therapeutic drug level monitoring: Secondary | ICD-10-CM

## 2016-12-19 DIAGNOSIS — I359 Nonrheumatic aortic valve disorder, unspecified: Secondary | ICD-10-CM

## 2016-12-19 DIAGNOSIS — I712 Thoracic aortic aneurysm, without rupture, unspecified: Secondary | ICD-10-CM

## 2016-12-19 LAB — POCT INR: INR: 2.9

## 2016-12-31 ENCOUNTER — Ambulatory Visit (INDEPENDENT_AMBULATORY_CARE_PROVIDER_SITE_OTHER): Payer: Medicare Other | Admitting: Vascular Surgery

## 2016-12-31 ENCOUNTER — Ambulatory Visit
Admission: RE | Admit: 2016-12-31 | Discharge: 2016-12-31 | Disposition: A | Discharge status: Home / Self Care | Payer: Medicare Other | Source: Ambulatory Visit | Attending: Vascular Surgery | Admitting: Vascular Surgery

## 2016-12-31 ENCOUNTER — Encounter: Payer: Self-pay | Admitting: Vascular Surgery

## 2016-12-31 VITALS — BP 139/79 | HR 53 | Temp 97.1°F | Resp 18 | Ht 72.0 in | Wt 175.0 lb

## 2016-12-31 DIAGNOSIS — I714 Abdominal aortic aneurysm, without rupture, unspecified: Secondary | ICD-10-CM

## 2016-12-31 DIAGNOSIS — Z8679 Personal history of other diseases of the circulatory system: Secondary | ICD-10-CM

## 2016-12-31 DIAGNOSIS — Z48812 Encounter for surgical aftercare following surgery on the circulatory system: Secondary | ICD-10-CM

## 2016-12-31 DIAGNOSIS — Z9889 Other specified postprocedural states: Secondary | ICD-10-CM

## 2016-12-31 MED ORDER — IOPAMIDOL (ISOVUE-370) INJECTION 76%
75.0000 mL | Freq: Once | INTRAVENOUS | Status: AC | PRN
  Administered 2016-12-31: 75 mL via INTRAVENOUS

## 2016-12-31 NOTE — Progress Notes (Signed)
   Patient name: Patrick [name] MRN: [id number] DOB: [birth year] Sex: male  REASON FOR VISIT:   Follow-up after EVAR  HPI:   Patrick [name] is a pleasant 76 y.o. male who was found to have a saccular aneurysm of his infrarenal aorta.  He underwent endovascular repair of this on 11/28/2016.  He presents for his first follow-up visit.  He has no specific complaints.  He has resumed his normal activities and is already playing golf.  Current Outpatient Medications  Medication Sig Dispense Refill  . amLODipine (NORVASC) 5 MG tablet Take 1 tablet (5 mg total) by mouth daily. 30 tablet 11  . atorvastatin (LIPITOR) 10 MG tablet Take 1 tablet (10 mg total) by mouth daily. 30 tablet 11  . pantoprazole (PROTONIX) 40 MG tablet Take 1 tablet (40 mg total) by mouth daily. 90 tablet 3  . SUMAtriptan (IMITREX) 50 MG tablet take 1/2 tablet by mouth every 2 hours if needed as directed 9 tablet 3  . tadalafil (CIALIS) 5 MG tablet Take 1 tablet (5 mg total) by mouth daily as needed for erectile dysfunction. (Patient taking differently: Take 5 mg by mouth daily as needed (blood pressure). ) 90 tablet 0  . warfarin (COUMADIN) 7.5 MG tablet take 1 tablet by mouth once daily (Patient taking differently: Take 7.5mg  by mouth once daily) 90 tablet 1  . amoxicillin (AMOXIL) 500 MG capsule take 1 capsule by mouth four times a day (Patient not taking: Reported on 12/31/2016) 20 capsule 3  . desonide (DESOWEN) 0.05 % cream Apply 1 application topically daily as needed. (Patient not taking: Reported on 12/31/2016) 60 g 3  . oxyCODONE-acetaminophen (PERCOCET/ROXICET) 5-325 MG tablet Take 1 tablet by mouth every 6 (six) hours as needed. (Patient not taking: Reported on 12/31/2016) 6 tablet 0   No current facility-administered medications for this visit.     REVIEW OF SYSTEMS:  [X]  denotes positive finding, [ ]  denotes negative finding Cardiac  Comments:  Chest pain or chest pressure:    Shortness of breath upon exertion:      Short of breath when lying flat:    Irregular heart rhythm:    Constitutional    Fever or chills:     PHYSICAL EXAM:   Vitals:   12/31/16 1321  BP: 139/79  Pulse: (!) 53  Resp: 18  Temp: (!) 97.1 F (36.2 C)  TempSrc: Oral  SpO2: 100%  Weight: 175 lb (79.4 kg)  Height: 6' (1.829 m)    GENERAL: The patient is a well-nourished male, in no acute distress. The vital signs are documented above. CARDIOVASCULAR: There is a regular rate and rhythm. PULMONARY: There is good air exchange bilaterally without wheezing or rales. His right groin incision is healing nicely. He has palpable pedal pulses.  DATA:   CT ANGIO OF ABDOMEN PELVIS: I have reviewed his CT angiogram of the abdomen and pelvis.  The stent graft is in excellent position with no evidence of endoleak.  Saccular aneurysm is unchanged in size.  MEDICAL ISSUES:   STATUS POST ENDOVASCULAR REPAIR OF SACCULAR ABDOMINAL AORTIC ANEURYSM: The patient is doing well status post endovascular repair of his saccular abdominal aortic aneurysm.  His CT scan looks good with the graft in good position.  I have ordered an ultrasound in 1 year and I will see him back at that time.  Deitra Mayo [facility] [place] [phone]

## 2017-01-08 NOTE — Addendum Note (Signed)
Addended by: Lianne Cure A on: 01/08/2017 12:22 PM   Modules accepted: Orders

## 2017-01-09 ENCOUNTER — Encounter: Payer: Self-pay | Admitting: Internal Medicine

## 2017-01-09 DIAGNOSIS — L821 Other seborrheic keratosis: Secondary | ICD-10-CM | POA: Diagnosis not present

## 2017-01-09 DIAGNOSIS — L812 Freckles: Secondary | ICD-10-CM | POA: Diagnosis not present

## 2017-01-09 DIAGNOSIS — L570 Actinic keratosis: Secondary | ICD-10-CM | POA: Diagnosis not present

## 2017-01-09 DIAGNOSIS — Z85828 Personal history of other malignant neoplasm of skin: Secondary | ICD-10-CM | POA: Diagnosis not present

## 2017-01-14 ENCOUNTER — Ambulatory Visit (INDEPENDENT_AMBULATORY_CARE_PROVIDER_SITE_OTHER): Payer: Medicare Other | Admitting: *Deleted

## 2017-01-14 DIAGNOSIS — Z5181 Encounter for therapeutic drug level monitoring: Secondary | ICD-10-CM | POA: Diagnosis not present

## 2017-01-14 DIAGNOSIS — Z952 Presence of prosthetic heart valve: Secondary | ICD-10-CM | POA: Diagnosis not present

## 2017-01-14 DIAGNOSIS — I359 Nonrheumatic aortic valve disorder, unspecified: Secondary | ICD-10-CM

## 2017-01-14 DIAGNOSIS — I712 Thoracic aortic aneurysm, without rupture, unspecified: Secondary | ICD-10-CM

## 2017-01-14 LAB — POCT INR: INR: 3.1

## 2017-01-14 NOTE — Patient Instructions (Signed)
Today Nov 21st take 1/2 tablet then continue taking 1 tablet (7.5mg ) daily. Repeat INR 4 weeks. Call with any questions  [phone]

## 2017-02-11 ENCOUNTER — Telehealth: Payer: Self-pay | Admitting: Internal Medicine

## 2017-02-11 ENCOUNTER — Ambulatory Visit (INDEPENDENT_AMBULATORY_CARE_PROVIDER_SITE_OTHER): Payer: Medicare Other

## 2017-02-11 DIAGNOSIS — I712 Thoracic aortic aneurysm, without rupture, unspecified: Secondary | ICD-10-CM

## 2017-02-11 DIAGNOSIS — I359 Nonrheumatic aortic valve disorder, unspecified: Secondary | ICD-10-CM

## 2017-02-11 DIAGNOSIS — Z5181 Encounter for therapeutic drug level monitoring: Secondary | ICD-10-CM

## 2017-02-11 LAB — POCT INR: INR: 3

## 2017-02-11 MED ORDER — CIPROFLOXACIN HCL 500 MG PO TABS: 500.0000 mg | ORAL_TABLET | Freq: Two times a day (BID) | ORAL | 1 refills | Status: DC

## 2017-02-11 MED ORDER — DIPHENOXYLATE-ATROPINE 2.5-0.025 MG PO TABS: 1.0000 | ORAL_TABLET | Freq: Four times a day (QID) | ORAL | 1 refills | Status: DC | PRN

## 2017-02-11 NOTE — Patient Instructions (Signed)
Skip today's dosage of Coumadin since on Cipro, then resume same dosage 1 tablet (7.5mg ) daily.  Repeat INR 4 weeks. Call with any questions or new medications  [phone]

## 2017-02-11 NOTE — Telephone Encounter (Signed)
Done. Thx.

## 2017-02-11 NOTE — Telephone Encounter (Signed)
Pt came in to the office for a refill of his   CIPRO and LOMOTIL Please advise on both POF

## 2017-02-12 NOTE — Telephone Encounter (Signed)
Notified pt rx has been sent to your local pharmacy.../lmb  

## 2017-02-20 ENCOUNTER — Other Ambulatory Visit: Payer: Self-pay | Admitting: *Deleted

## 2017-02-20 MED ORDER — WARFARIN SODIUM 7.5 MG PO TABS: 7.5000 mg | ORAL_TABLET | Freq: Every day | ORAL | 1 refills | Status: DC

## 2017-03-03 DIAGNOSIS — L820 Inflamed seborrheic keratosis: Secondary | ICD-10-CM | POA: Diagnosis not present

## 2017-03-03 DIAGNOSIS — Z85828 Personal history of other malignant neoplasm of skin: Secondary | ICD-10-CM | POA: Diagnosis not present

## 2017-03-09 ENCOUNTER — Ambulatory Visit (INDEPENDENT_AMBULATORY_CARE_PROVIDER_SITE_OTHER): Payer: Medicare Other | Admitting: *Deleted

## 2017-03-09 DIAGNOSIS — I712 Thoracic aortic aneurysm, without rupture, unspecified: Secondary | ICD-10-CM

## 2017-03-09 DIAGNOSIS — Z5181 Encounter for therapeutic drug level monitoring: Secondary | ICD-10-CM | POA: Diagnosis not present

## 2017-03-09 DIAGNOSIS — I359 Nonrheumatic aortic valve disorder, unspecified: Secondary | ICD-10-CM

## 2017-03-09 LAB — POCT INR: INR: 1.8

## 2017-03-09 NOTE — Patient Instructions (Signed)
Description   Today take 1.5 tablets, then continue your same dosage 1 tablet (7.5mg ) daily.  Repeat INR 4 weeks. Call with any questions or new medications  [phone]

## 2017-04-06 ENCOUNTER — Ambulatory Visit (INDEPENDENT_AMBULATORY_CARE_PROVIDER_SITE_OTHER): Payer: Medicare Other

## 2017-04-06 ENCOUNTER — Other Ambulatory Visit: Payer: Self-pay

## 2017-04-06 DIAGNOSIS — I712 Thoracic aortic aneurysm, without rupture, unspecified: Secondary | ICD-10-CM

## 2017-04-06 DIAGNOSIS — I359 Nonrheumatic aortic valve disorder, unspecified: Secondary | ICD-10-CM

## 2017-04-06 DIAGNOSIS — Z5181 Encounter for therapeutic drug level monitoring: Secondary | ICD-10-CM

## 2017-04-06 LAB — POCT INR: INR: 1.4

## 2017-04-06 NOTE — Telephone Encounter (Signed)
Pt seen in Coumadin Clinic stating he needs dental pre med called into his pharmacy [facility]	[place].  Thanks

## 2017-04-06 NOTE — Patient Instructions (Signed)
Description   Take 1.5 tablets today and tomorrow, then start taking 1 tablet (7.5mg ) daily except 1.5 tablets on Mondays.  Repeat INR 2 weeks. Call with any questions or new medications  [phone]

## 2017-04-08 MED ORDER — AMOXICILLIN 500 MG PO TABS: ORAL_TABLET | ORAL | 1 refills | Status: DC

## 2017-04-08 NOTE — Telephone Encounter (Signed)
Amoxicillin is listed on med list as 500 mg 4 times daily. This has been filled in the past by primary care.  I spoke with pt and confirmed he has been taking 4 tablets by mouth one hour prior to dental appointments.  Pt aware this is the correct dosing instructions.  Will send new prescription to Kristopher Oppenheim at [facility].  Pt reports it is usually filled for 20 tablets.

## 2017-04-20 ENCOUNTER — Ambulatory Visit (INDEPENDENT_AMBULATORY_CARE_PROVIDER_SITE_OTHER): Payer: Medicare Other | Admitting: Pharmacist

## 2017-04-20 DIAGNOSIS — I712 Thoracic aortic aneurysm, without rupture, unspecified: Secondary | ICD-10-CM

## 2017-04-20 DIAGNOSIS — Z5181 Encounter for therapeutic drug level monitoring: Secondary | ICD-10-CM | POA: Diagnosis not present

## 2017-04-20 DIAGNOSIS — I359 Nonrheumatic aortic valve disorder, unspecified: Secondary | ICD-10-CM

## 2017-04-20 LAB — POCT INR: INR: 3.4

## 2017-04-20 NOTE — Patient Instructions (Signed)
Take 0.5 tablets today, then continue taking 1 tablet (7.5mg ) daily except 1.5 tablets on Mondays.  Repeat INR 2 weeks. Call with any questions or new medications  [phone]

## 2017-05-04 ENCOUNTER — Ambulatory Visit (INDEPENDENT_AMBULATORY_CARE_PROVIDER_SITE_OTHER): Payer: Medicare Other | Admitting: *Deleted

## 2017-05-04 DIAGNOSIS — I359 Nonrheumatic aortic valve disorder, unspecified: Secondary | ICD-10-CM

## 2017-05-04 DIAGNOSIS — I712 Thoracic aortic aneurysm, without rupture, unspecified: Secondary | ICD-10-CM

## 2017-05-04 DIAGNOSIS — Z5181 Encounter for therapeutic drug level monitoring: Secondary | ICD-10-CM | POA: Diagnosis not present

## 2017-05-04 LAB — POCT INR: INR: 3.1

## 2017-05-04 NOTE — Patient Instructions (Signed)
Description   Today take 1/2 tablet then continue taking 1 tablet (7.5mg ) daily. Repeat INR 3 weeks. Call with any questions or new medications  [phone]

## 2017-05-09 ENCOUNTER — Other Ambulatory Visit: Payer: Self-pay | Admitting: Cardiology

## 2017-05-18 DIAGNOSIS — Z85828 Personal history of other malignant neoplasm of skin: Secondary | ICD-10-CM | POA: Diagnosis not present

## 2017-05-18 DIAGNOSIS — L308 Other specified dermatitis: Secondary | ICD-10-CM | POA: Diagnosis not present

## 2017-05-25 ENCOUNTER — Ambulatory Visit (INDEPENDENT_AMBULATORY_CARE_PROVIDER_SITE_OTHER): Payer: Medicare Other | Admitting: Pharmacist

## 2017-05-25 DIAGNOSIS — I359 Nonrheumatic aortic valve disorder, unspecified: Secondary | ICD-10-CM | POA: Diagnosis not present

## 2017-05-25 DIAGNOSIS — I712 Thoracic aortic aneurysm, without rupture, unspecified: Secondary | ICD-10-CM

## 2017-05-25 DIAGNOSIS — Z5181 Encounter for therapeutic drug level monitoring: Secondary | ICD-10-CM

## 2017-05-25 LAB — POCT INR: INR: 1.6

## 2017-05-25 NOTE — Patient Instructions (Signed)
Description   Take 1.5 tablets today and tomorrow, then continue taking 1 tablet (7.5mg ) daily. Repeat INR 2 weeks. Call with any questions or new medications #[phone]

## 2017-06-09 ENCOUNTER — Ambulatory Visit (INDEPENDENT_AMBULATORY_CARE_PROVIDER_SITE_OTHER): Payer: Medicare Other | Admitting: Pharmacist

## 2017-06-09 DIAGNOSIS — I712 Thoracic aortic aneurysm, without rupture, unspecified: Secondary | ICD-10-CM

## 2017-06-09 DIAGNOSIS — I359 Nonrheumatic aortic valve disorder, unspecified: Secondary | ICD-10-CM

## 2017-06-09 DIAGNOSIS — Z5181 Encounter for therapeutic drug level monitoring: Secondary | ICD-10-CM

## 2017-06-09 LAB — POCT INR: INR: 3.1

## 2017-06-09 NOTE — Patient Instructions (Signed)
Description   Take 1/2 tablet today, then continue taking 1 tablet (7.5mg ) daily. Repeat INR 3 weeks. Call with any questions or new medications #[phone]

## 2017-06-18 DIAGNOSIS — R972 Elevated prostate specific antigen [PSA]: Secondary | ICD-10-CM | POA: Diagnosis not present

## 2017-06-18 DIAGNOSIS — N401 Enlarged prostate with lower urinary tract symptoms: Secondary | ICD-10-CM | POA: Diagnosis not present

## 2017-06-18 DIAGNOSIS — R3912 Poor urinary stream: Secondary | ICD-10-CM | POA: Diagnosis not present

## 2017-06-26 DIAGNOSIS — H2513 Age-related nuclear cataract, bilateral: Secondary | ICD-10-CM | POA: Diagnosis not present

## 2017-06-28 ENCOUNTER — Other Ambulatory Visit: Payer: Self-pay | Admitting: Internal Medicine

## 2017-06-30 ENCOUNTER — Ambulatory Visit (INDEPENDENT_AMBULATORY_CARE_PROVIDER_SITE_OTHER): Payer: Medicare Other

## 2017-06-30 DIAGNOSIS — I359 Nonrheumatic aortic valve disorder, unspecified: Secondary | ICD-10-CM

## 2017-06-30 DIAGNOSIS — I712 Thoracic aortic aneurysm, without rupture, unspecified: Secondary | ICD-10-CM

## 2017-06-30 DIAGNOSIS — Z5181 Encounter for therapeutic drug level monitoring: Secondary | ICD-10-CM | POA: Diagnosis not present

## 2017-06-30 LAB — POCT INR: INR: 3.3

## 2017-06-30 NOTE — Patient Instructions (Signed)
Description   Skip today's dosage of Coumadin, then continue taking 1 tablet (7.5mg ) daily. Repeat INR 3 weeks. Call with any questions or new medications #[phone]

## 2017-07-10 DIAGNOSIS — L570 Actinic keratosis: Secondary | ICD-10-CM | POA: Diagnosis not present

## 2017-07-10 DIAGNOSIS — Z85828 Personal history of other malignant neoplasm of skin: Secondary | ICD-10-CM | POA: Diagnosis not present

## 2017-07-10 DIAGNOSIS — D225 Melanocytic nevi of trunk: Secondary | ICD-10-CM | POA: Diagnosis not present

## 2017-07-10 DIAGNOSIS — L812 Freckles: Secondary | ICD-10-CM | POA: Diagnosis not present

## 2017-07-10 DIAGNOSIS — L821 Other seborrheic keratosis: Secondary | ICD-10-CM | POA: Diagnosis not present

## 2017-07-21 ENCOUNTER — Ambulatory Visit (INDEPENDENT_AMBULATORY_CARE_PROVIDER_SITE_OTHER): Payer: Medicare Other

## 2017-07-21 DIAGNOSIS — Z5181 Encounter for therapeutic drug level monitoring: Secondary | ICD-10-CM | POA: Diagnosis not present

## 2017-07-21 DIAGNOSIS — I359 Nonrheumatic aortic valve disorder, unspecified: Secondary | ICD-10-CM | POA: Diagnosis not present

## 2017-07-21 DIAGNOSIS — I712 Thoracic aortic aneurysm, without rupture, unspecified: Secondary | ICD-10-CM

## 2017-07-21 LAB — POCT INR: INR: 2 (ref 2.0–3.0)

## 2017-07-21 NOTE — Patient Instructions (Signed)
Description   Continue on same dosage 1 tablet (7.5mg) daily. Repeat INR 4 weeks. Call with any questions or new medications #[phone]     

## 2017-08-06 DIAGNOSIS — L570 Actinic keratosis: Secondary | ICD-10-CM | POA: Diagnosis not present

## 2017-08-06 DIAGNOSIS — L820 Inflamed seborrheic keratosis: Secondary | ICD-10-CM | POA: Diagnosis not present

## 2017-08-06 DIAGNOSIS — Z85828 Personal history of other malignant neoplasm of skin: Secondary | ICD-10-CM | POA: Diagnosis not present

## 2017-08-18 ENCOUNTER — Ambulatory Visit (INDEPENDENT_AMBULATORY_CARE_PROVIDER_SITE_OTHER): Payer: Medicare Other

## 2017-08-18 DIAGNOSIS — I712 Thoracic aortic aneurysm, without rupture, unspecified: Secondary | ICD-10-CM

## 2017-08-18 DIAGNOSIS — Z5181 Encounter for therapeutic drug level monitoring: Secondary | ICD-10-CM

## 2017-08-18 DIAGNOSIS — I359 Nonrheumatic aortic valve disorder, unspecified: Secondary | ICD-10-CM

## 2017-08-18 LAB — POCT INR: INR: 2 (ref 2.0–3.0)

## 2017-08-18 NOTE — Patient Instructions (Signed)
Description   Continue on same dosage 1 tablet (7.5mg ) daily. Repeat INR 4 weeks. Call with any questions or new medications #[phone]

## 2017-08-30 ENCOUNTER — Other Ambulatory Visit: Payer: Self-pay | Admitting: Cardiovascular Disease

## 2017-08-31 ENCOUNTER — Telehealth: Payer: Self-pay | Admitting: Cardiovascular Disease

## 2017-08-31 NOTE — Telephone Encounter (Addendum)
Pharm please address coumadin, only 2 teeth.     Primary Cardiologist: Lauree Chandler, MD  Chart reviewed as part of pre-operative protocol coverage. Given past medical history and time since last visit, based on ACC/AHA guidelines, RUSHI CHASEN would be at acceptable risk for the planned procedure without further cardiovascular testing.   With his AVR of aortic valve he will need premedication with amoxicillin - pt has this.  Will send message about coumadin when pharmacy has reviewed.    I will route this recommendation to the requesting party via Epic fax function and remove from pre-op pool.  Please call with questions.  Cecilie Kicks, NP 08/31/2017, 3:05 PM

## 2017-08-31 NOTE — Telephone Encounter (Signed)
Patient with diagnosis of St. Jude aortic valve on warfarin for anticoagulation.    Procedure: 2 molars extracted Date of procedure: 09/02/17  Patient does not appear to have other risk factors for stroke or AF.    CrCl 60.2 Platelet count 106  Per office protocol, patient can hold warfarin for 1-2 days prior to procedure (as procedure is 2 days from now)   Patient will not need bridging with Lovenox (enoxaparin) around procedure.  If not bridging, patient should restart warfarin on the evening of procedure or day after, at discretion of procedure MD

## 2017-08-31 NOTE — Telephone Encounter (Signed)
1. What dental office are you calling from?  2. Dr Natividad Brood 3. What is your office phone number? [phone]   4. What is your fax number?[phone]  5. What type of procedure is the patient having performed?  2 lower Molar Extracted   6. What date is procedure scheduled or is the patient there now? 09-02-17   7. What is your question (ex. Antibiotics prior to procedure, holding medication-we need to know how long dentist wants pt to hold med)? Can pt hold his Plavix today and tomorrow?

## 2017-09-01 NOTE — Telephone Encounter (Signed)
See attached

## 2017-09-01 NOTE — Telephone Encounter (Signed)
Spoke to pt about holding warfarin 1-2 days before procedure. Pt stated he will skip his dose today seeing as appointment is tomorrow 09/02/17. Pt verbalized understanding.

## 2017-09-04 ENCOUNTER — Ambulatory Visit (INDEPENDENT_AMBULATORY_CARE_PROVIDER_SITE_OTHER): Payer: Medicare Other | Admitting: *Deleted

## 2017-09-04 DIAGNOSIS — I359 Nonrheumatic aortic valve disorder, unspecified: Secondary | ICD-10-CM

## 2017-09-04 DIAGNOSIS — I712 Thoracic aortic aneurysm, without rupture, unspecified: Secondary | ICD-10-CM

## 2017-09-04 DIAGNOSIS — Z5181 Encounter for therapeutic drug level monitoring: Secondary | ICD-10-CM | POA: Diagnosis not present

## 2017-09-04 LAB — POCT INR: INR: 2.3 (ref 2.0–3.0)

## 2017-09-04 NOTE — Patient Instructions (Signed)
Description   Continue on same dosage 1 tablet (7.5mg ) daily. Repeat INR 4 weeks. Call with any questions or new medications #[phone]

## 2017-09-15 DIAGNOSIS — N401 Enlarged prostate with lower urinary tract symptoms: Secondary | ICD-10-CM | POA: Diagnosis not present

## 2017-09-15 DIAGNOSIS — N138 Other obstructive and reflux uropathy: Secondary | ICD-10-CM | POA: Diagnosis not present

## 2017-10-01 ENCOUNTER — Other Ambulatory Visit: Payer: Self-pay | Admitting: Internal Medicine

## 2017-10-05 ENCOUNTER — Ambulatory Visit (INDEPENDENT_AMBULATORY_CARE_PROVIDER_SITE_OTHER): Payer: Medicare Other | Admitting: *Deleted

## 2017-10-05 DIAGNOSIS — I359 Nonrheumatic aortic valve disorder, unspecified: Secondary | ICD-10-CM | POA: Diagnosis not present

## 2017-10-05 DIAGNOSIS — I712 Thoracic aortic aneurysm, without rupture, unspecified: Secondary | ICD-10-CM

## 2017-10-05 DIAGNOSIS — Z5181 Encounter for therapeutic drug level monitoring: Secondary | ICD-10-CM | POA: Diagnosis not present

## 2017-10-05 LAB — POCT INR: INR: 1.7 — AB (ref 2.0–3.0)

## 2017-10-05 NOTE — Patient Instructions (Signed)
Description   Today take 1.5 tablets, then Continue on same dosage 1 tablet (7.5mg ) daily. Repeat INR 4 weeks. Call with any questions or new medications #[phone]

## 2017-11-02 ENCOUNTER — Ambulatory Visit (INDEPENDENT_AMBULATORY_CARE_PROVIDER_SITE_OTHER): Payer: Medicare Other

## 2017-11-02 ENCOUNTER — Other Ambulatory Visit: Payer: Self-pay | Admitting: Cardiology

## 2017-11-02 DIAGNOSIS — I712 Thoracic aortic aneurysm, without rupture, unspecified: Secondary | ICD-10-CM

## 2017-11-02 DIAGNOSIS — I359 Nonrheumatic aortic valve disorder, unspecified: Secondary | ICD-10-CM

## 2017-11-02 DIAGNOSIS — Z5181 Encounter for therapeutic drug level monitoring: Secondary | ICD-10-CM | POA: Diagnosis not present

## 2017-11-02 LAB — POCT INR: INR: 2.5 (ref 2.0–3.0)

## 2017-11-02 NOTE — Patient Instructions (Signed)
Description   Continue on same dosage 1 tablet (7.5mg ) daily. Repeat INR 4 weeks. Call with any questions or new medications #[phone]

## 2017-11-16 ENCOUNTER — Telehealth: Payer: Self-pay | Admitting: Internal Medicine

## 2017-11-16 MED ORDER — ATORVASTATIN CALCIUM 10 MG PO TABS: 10.0000 mg | ORAL_TABLET | Freq: Every day | ORAL | 11 refills | Status: DC

## 2017-11-16 NOTE — Telephone Encounter (Signed)
RX sent

## 2017-11-16 NOTE — Telephone Encounter (Signed)
Copied from [place] [phone]. Topic: Quick Communication - Rx Refill/Question >> Nov 16, 2017  9:40 AM Oliver Pila B wrote: Medication: atorvastatin (LIPITOR) 10 MG tablet [[id number]]   Pt's medication was sent to the wrong pharmacy; pt's medication is needing to go to the [facility] on file

## 2017-11-25 ENCOUNTER — Ambulatory Visit (INDEPENDENT_AMBULATORY_CARE_PROVIDER_SITE_OTHER): Payer: Medicare Other | Admitting: Cardiovascular Disease

## 2017-11-25 ENCOUNTER — Encounter: Payer: Self-pay | Admitting: Cardiovascular Disease

## 2017-11-25 VITALS — BP 138/84 | HR 71 | Ht 72.0 in | Wt 177.4 lb

## 2017-11-25 DIAGNOSIS — I712 Thoracic aortic aneurysm, without rupture, unspecified: Secondary | ICD-10-CM

## 2017-11-25 DIAGNOSIS — I10 Essential (primary) hypertension: Secondary | ICD-10-CM | POA: Diagnosis not present

## 2017-11-25 DIAGNOSIS — I359 Nonrheumatic aortic valve disorder, unspecified: Secondary | ICD-10-CM | POA: Diagnosis not present

## 2017-11-25 NOTE — Patient Instructions (Signed)
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
## 2017-11-26 NOTE — Addendum Note (Signed)
Addended by: Mendel Ryder on: 11/26/2017 10:15 AM   Modules accepted: Orders

## 2017-11-30 ENCOUNTER — Ambulatory Visit (INDEPENDENT_AMBULATORY_CARE_PROVIDER_SITE_OTHER): Payer: Medicare Other

## 2017-11-30 ENCOUNTER — Other Ambulatory Visit: Payer: Self-pay | Admitting: Cardiology

## 2017-11-30 DIAGNOSIS — I359 Nonrheumatic aortic valve disorder, unspecified: Secondary | ICD-10-CM

## 2017-11-30 DIAGNOSIS — I712 Thoracic aortic aneurysm, without rupture, unspecified: Secondary | ICD-10-CM

## 2017-11-30 DIAGNOSIS — Z5181 Encounter for therapeutic drug level monitoring: Secondary | ICD-10-CM

## 2017-11-30 LAB — POCT INR: INR: 4.7 — AB (ref 2.0–3.0)

## 2017-11-30 NOTE — Patient Instructions (Signed)
Description   Skip today and tomorrow's dosage of Coumadin, then start taking 1 tablet (7.5mg ) daily except 1/2 tablet on Mondays. Repeat INR 1 week. Call with any questions or new medications #[phone]

## 2017-12-08 ENCOUNTER — Ambulatory Visit (INDEPENDENT_AMBULATORY_CARE_PROVIDER_SITE_OTHER): Payer: Medicare Other

## 2017-12-08 DIAGNOSIS — I712 Thoracic aortic aneurysm, without rupture, unspecified: Secondary | ICD-10-CM

## 2017-12-08 DIAGNOSIS — Z5181 Encounter for therapeutic drug level monitoring: Secondary | ICD-10-CM

## 2017-12-08 DIAGNOSIS — I359 Nonrheumatic aortic valve disorder, unspecified: Secondary | ICD-10-CM | POA: Diagnosis not present

## 2017-12-08 LAB — POCT INR: INR: 2.1 (ref 2.0–3.0)

## 2017-12-08 NOTE — Patient Instructions (Signed)
Description   Continue on same dosage 1 tablet (7.5mg ) daily.  Repeat INR 2 weeks. Call with any questions or new medications #[phone]

## 2017-12-22 ENCOUNTER — Other Ambulatory Visit (INDEPENDENT_AMBULATORY_CARE_PROVIDER_SITE_OTHER): Payer: Medicare Other

## 2017-12-22 ENCOUNTER — Ambulatory Visit (INDEPENDENT_AMBULATORY_CARE_PROVIDER_SITE_OTHER): Payer: Medicare Other | Admitting: Pharmacist

## 2017-12-22 ENCOUNTER — Ambulatory Visit (INDEPENDENT_AMBULATORY_CARE_PROVIDER_SITE_OTHER): Payer: Medicare Other | Admitting: Internal Medicine

## 2017-12-22 ENCOUNTER — Encounter: Payer: Self-pay | Admitting: Internal Medicine

## 2017-12-22 VITALS — BP 126/72 | HR 57 | Temp 98.1°F | Ht 72.0 in | Wt 182.0 lb

## 2017-12-22 DIAGNOSIS — Z5181 Encounter for therapeutic drug level monitoring: Secondary | ICD-10-CM

## 2017-12-22 DIAGNOSIS — E785 Hyperlipidemia, unspecified: Secondary | ICD-10-CM | POA: Diagnosis not present

## 2017-12-22 DIAGNOSIS — I10 Essential (primary) hypertension: Secondary | ICD-10-CM | POA: Diagnosis not present

## 2017-12-22 DIAGNOSIS — I712 Thoracic aortic aneurysm, without rupture, unspecified: Secondary | ICD-10-CM

## 2017-12-22 DIAGNOSIS — M19049 Primary osteoarthritis, unspecified hand: Secondary | ICD-10-CM | POA: Insufficient documentation

## 2017-12-22 DIAGNOSIS — G43809 Other migraine, not intractable, without status migrainosus: Secondary | ICD-10-CM

## 2017-12-22 DIAGNOSIS — I359 Nonrheumatic aortic valve disorder, unspecified: Secondary | ICD-10-CM | POA: Diagnosis not present

## 2017-12-22 DIAGNOSIS — N401 Enlarged prostate with lower urinary tract symptoms: Secondary | ICD-10-CM | POA: Diagnosis not present

## 2017-12-22 DIAGNOSIS — R972 Elevated prostate specific antigen [PSA]: Secondary | ICD-10-CM | POA: Diagnosis not present

## 2017-12-22 DIAGNOSIS — Z23 Encounter for immunization: Secondary | ICD-10-CM

## 2017-12-22 DIAGNOSIS — R338 Other retention of urine: Secondary | ICD-10-CM | POA: Diagnosis not present

## 2017-12-22 DIAGNOSIS — M722 Plantar fascial fibromatosis: Secondary | ICD-10-CM | POA: Insufficient documentation

## 2017-12-22 LAB — URINALYSIS
Bilirubin Urine: NEGATIVE
Hgb urine dipstick: NEGATIVE
KETONES UR: NEGATIVE
Leukocytes, UA: NEGATIVE
Nitrite: NEGATIVE
PH: 6.5 (ref 5.0–8.0)
SPECIFIC GRAVITY, URINE: 1.015 (ref 1.000–1.030)
TOTAL PROTEIN, URINE-UPE24: NEGATIVE
URINE GLUCOSE: NEGATIVE
Urobilinogen, UA: 0.2 (ref 0.0–1.0)

## 2017-12-22 LAB — BASIC METABOLIC PANEL
BUN: 17 mg/dL (ref 6–23)
CHLORIDE: 103 meq/L (ref 96–112)
CO2: 31 mEq/L (ref 19–32)
CREATININE: 1.36 mg/dL (ref 0.40–1.50)
Calcium: 9.6 mg/dL (ref 8.4–10.5)
GFR: 54.23 mL/min — AB (ref >60.00)
Glucose, Bld: 101 mg/dL — ABNORMAL HIGH (ref 70–99)
POTASSIUM: 3.9 meq/L (ref 3.5–5.1)
Sodium: 140 mEq/L (ref 135–145)

## 2017-12-22 LAB — CBC WITH DIFFERENTIAL/PLATELET
BASOS PCT: 0.2 % (ref 0.0–3.0)
Basophils Absolute: 0 10*3/uL (ref 0.0–0.1)
EOS ABS: 0 10*3/uL (ref 0.0–0.7)
EOS PCT: 1.2 % (ref 0.0–5.0)
HEMATOCRIT: 42.8 % (ref 39.0–52.0)
HEMOGLOBIN: 15.1 g/dL (ref 13.0–17.0)
LYMPHS PCT: 16.5 % (ref 12.0–46.0)
Lymphs Abs: 0.6 10*3/uL — ABNORMAL LOW (ref 0.7–4.0)
MCHC: 35.3 g/dL (ref 30.0–36.0)
MCV: 99 fl (ref 78.0–100.0)
MONOS PCT: 9.2 % (ref 3.0–12.0)
Monocytes Absolute: 0.3 10*3/uL (ref 0.1–1.0)
NEUTROS ABS: 2.7 10*3/uL (ref 1.4–7.7)
Neutrophils Relative %: 72.9 % (ref 43.0–77.0)
PLATELETS: 101 10*3/uL — AB (ref 150.0–400.0)
RBC: 4.32 Mil/uL (ref 4.22–5.81)
RDW: 13.8 % (ref 11.5–15.5)
WBC: 3.6 10*3/uL — AB (ref 4.0–10.5)

## 2017-12-22 LAB — LIPID PANEL
CHOLESTEROL: 139 mg/dL (ref 0–200)
HDL: 45.8 mg/dL (ref >39.00)
NonHDL: 92.85
Total CHOL/HDL Ratio: 3
Triglycerides: 214 mg/dL — ABNORMAL HIGH (ref 0.0–149.0)
VLDL: 42.8 mg/dL — AB (ref 0.0–40.0)

## 2017-12-22 LAB — PSA: PSA: 7.43 ng/mL — AB (ref 0.10–4.00)

## 2017-12-22 LAB — LDL CHOLESTEROL, DIRECT: Direct LDL: 67 mg/dL

## 2017-12-22 LAB — HEPATIC FUNCTION PANEL
ALT: 21 U/L (ref 0–53)
AST: 19 U/L (ref 0–37)
Albumin: 4.5 g/dL (ref 3.5–5.2)
Alkaline Phosphatase: 74 U/L (ref 39–117)
BILIRUBIN DIRECT: 0.1 mg/dL (ref 0.0–0.3)
BILIRUBIN TOTAL: 0.7 mg/dL (ref 0.2–1.2)
Total Protein: 6.3 g/dL (ref 6.0–8.3)

## 2017-12-22 LAB — TSH: TSH: 0.82 u[IU]/mL (ref 0.35–4.50)

## 2017-12-22 LAB — POCT INR: INR: 2.7 (ref 2.0–3.0)

## 2017-12-22 MED ORDER — BUTALBITAL-APAP-CAFFEINE 50-325-40 MG PO TABS: 1.0000 | ORAL_TABLET | Freq: Two times a day (BID) | ORAL | 3 refills | Status: DC | PRN

## 2017-12-22 NOTE — Assessment & Plan Note (Signed)
B thumbs

## 2017-12-22 NOTE — Patient Instructions (Addendum)
Brix jar opener [place], [employer] shoes

## 2017-12-22 NOTE — Assessment & Plan Note (Signed)
PSA

## 2017-12-22 NOTE — Patient Instructions (Signed)
Description   Continue on same dosage 1 tablet (7.5mg ) daily.  Repeat INR 3 weeks. Call with any questions or new medications #[phone]

## 2017-12-22 NOTE — Assessment & Plan Note (Signed)
Amlodipine.

## 2017-12-22 NOTE — Assessment & Plan Note (Signed)
Fioricet - rare use  Potential benefits of a long term rare Fioricet  use as well as potential risks  and complications were explained to the patient and were aknowledged.

## 2017-12-22 NOTE — Progress Notes (Addendum)
Subjective:  Patient ID: JENNER ROSIER, male    DOB: 11/01/1940  Age: 77 y.o. MRN: [id number]  CC: No chief complaint on file.   HPI HARSHAL SIRMON presents for HTN, dyslipidemia, HAs, elev PSA/BPH C/o heel pain B x 3 mo, very painful in am R TMJ pain  Outpatient Medications Prior to Visit  Medication Sig Dispense Refill  . amLODipine (NORVASC) 5 MG tablet Take 1 tablet (5 mg total) by mouth daily. 90 tablet 3  . amoxicillin (AMOXIL) 500 MG tablet Take 4 tablets by mouth one hour prior to dental appointments 20 tablet 1  . atorvastatin (LIPITOR) 10 MG tablet Take 1 tablet (10 mg total) by mouth daily. 30 tablet 11  . desonide (DESOWEN) 0.05 % cream Apply 1 application topically daily as needed. 60 g 3  . diphenoxylate-atropine (LOMOTIL) 2.5-0.025 MG tablet Take 1 tablet by mouth 4 (four) times daily as needed for diarrhea or loose stools. 60 tablet 1  . pantoprazole (PROTONIX) 40 MG tablet TAKE ONE TABLET BY MOUTH DAILY 90 tablet 0  . SUMAtriptan (IMITREX) 50 MG tablet take 1/2 tablet by mouth every 2 hours if needed as directed 9 tablet 3  . tadalafil (CIALIS) 5 MG tablet Take 1 tablet (5 mg total) by mouth daily as needed for erectile dysfunction. (Patient taking differently: Take 5 mg by mouth daily as needed (blood pressure). ) 90 tablet 0  . tamsulosin (FLOMAX) 0.4 MG CAPS capsule Take 0.4 mg by mouth daily.    Marland Kitchen warfarin (COUMADIN) 7.5 MG tablet TAKE ONE TABLET BY MOUTH DAILY 90 tablet 1   No facility-administered medications prior to visit.     ROS: Review of Systems  Constitutional: Negative for appetite change, fatigue and unexpected weight change.  HENT: Negative for congestion, nosebleeds, sneezing, sore throat and trouble swallowing.   Eyes: Negative for itching and visual disturbance.  Respiratory: Negative for cough.   Cardiovascular: Negative for chest pain, palpitations and leg swelling.  Gastrointestinal: Negative for abdominal distention, blood in stool,  diarrhea and nausea.  Genitourinary: Negative for frequency and hematuria.  Musculoskeletal: Negative for back pain, gait problem, joint swelling and neck pain.  Skin: Negative for rash.  Neurological: Negative for dizziness, tremors, speech difficulty and weakness.  Psychiatric/Behavioral: Negative for agitation, dysphoric mood, sleep disturbance and suicidal ideas. The patient is nervous/anxious.     Objective:  BP 126/72 (BP Location: Left Arm, Patient Position: Sitting, Cuff Size: Normal)   Pulse (!) 57   Temp 98.1 F (36.7 C) (Oral)   Ht 6' (1.829 m)   Wt 182 lb (82.6 kg)   SpO2 98%   BMI 24.68 kg/m   BP Readings from Last 3 Encounters:  12/22/17 126/72  11/25/17 138/84  12/31/16 139/79    Wt Readings from Last 3 Encounters:  12/22/17 182 lb (82.6 kg)  11/25/17 177 lb 6.4 oz (80.5 kg)  12/31/16 175 lb (79.4 kg)    Physical Exam  Constitutional: He is oriented to person, place, and time. He appears well-developed. No distress.  NAD  HENT:  Mouth/Throat: Oropharynx is clear and moist.  Eyes: Pupils are equal, round, and reactive to light. Conjunctivae are normal.  Neck: Normal range of motion. No JVD present. No thyromegaly present.  Cardiovascular: Normal rate, regular rhythm, normal heart sounds and intact distal pulses. Exam reveals no gallop and no friction rub.  No murmur heard. Pulmonary/Chest: Effort normal and breath sounds normal. No respiratory distress. He has no wheezes. He has no  rales. He exhibits no tenderness.  Abdominal: Soft. Bowel sounds are normal. He exhibits no distension and no mass. There is no tenderness. There is no rebound and no guarding.  Musculoskeletal: Normal range of motion. He exhibits no edema or tenderness.  Lymphadenopathy:    He has no cervical adenopathy.  Neurological: He is alert and oriented to person, place, and time. He has normal reflexes. No cranial nerve deficit. He exhibits normal muscle tone. He displays a negative  Romberg sign. Coordination and gait normal.  Skin: Skin is warm and dry. No rash noted.  Psychiatric: He has a normal mood and affect. His behavior is normal. Judgment and thought content normal.  B thumbs - painful B heels painful  Lab Results  Component Value Date   WBC 7.8 11/29/2016   HGB 12.8 (L) 11/29/2016   HCT 36.8 (L) 11/29/2016   PLT 106 (L) 11/29/2016   GLUCOSE 169 (H) 11/29/2016   CHOL 151 11/07/2016   TRIG 91.0 11/07/2016   HDL 63.70 11/07/2016   LDLCALC 69 11/07/2016   ALT 20 11/24/2016   AST 23 11/24/2016   NA 136 11/29/2016   K 4.1 11/29/2016   CL 106 11/29/2016   CREATININE 1.19 11/29/2016   BUN 16 11/29/2016   CO2 22 11/29/2016   TSH 0.80 11/07/2016   PSA 8.48 (H) 11/07/2016   INR 2.7 12/22/2017   HGBA1C 5.3 08/20/2016    Ct Angio Abdomen Pelvis  W &/or Wo Contrast  Result Date: 12/31/2016 CLINICAL DATA:  Follow-up endovascular repair of infrarenal abdominal aortic aneurysm. EXAM: CTA ABDOMEN AND PELVIS WITH CONTRAST TECHNIQUE: Multidetector CT imaging of the abdomen and pelvis was performed using the standard protocol during bolus administration of intravenous contrast. Multiplanar reconstructed images and MIPs were obtained and reviewed to evaluate the vascular anatomy. CONTRAST:  75 cc Isovue 370 COMPARISON:  CT abdomen pelvis - 08/21/2016 FINDINGS: VASCULAR Aorta: Post endovascular repair of the abdominal aorta. The abdominal aortic stent is well apposed against the walls of the infrarenal abdominal aorta as well as the distal abdominal aorta. Saccular aneurysm arising from the left side of the infrarenal abdominal aorta is unchanged measuring 1.8 x 1.2 cm (coronal image 54, series 601), previously, 1.6 x 1.1 cm. There is no definitive persistent filling of the excluded saccular aneurysm. The abdominal aorta is widely patent without hemodynamically significant stenosis. No abdominal aortic dissection or periaortic stranding. Celiac: Widely patent without  hemodynamically significant narrowing. Conventional branching pattern. SMA: There is a minimal amount of mixed calcified and noncalcified atherosclerotic plaque involving the left side of the origin the SMA, not resulting in hemodynamically significant stenosis. Conventional branching pattern. The distal tributaries the SMA are widely patent without discrete intraluminal filling defect to suggest distal embolism. Renals: Solitary bilaterally. The bilateral renal arteries are widely patent without hemodynamically significant narrowing. No vessel irregularity to suggest FMD. IMA: Occluded at its origin with early reconstitution via collateral supply from the SMA. Inflow: There is a minimal amount of mixed calcified and noncalcified atherosclerotic plaque within the bilateral common iliac artery's, not resulting in hemodynamically significant stenosis. The bilateral internal iliac arteries are mildly disease though patent of normal caliber. The bilateral external iliac artery's are mildly tortuous though patent and of normal caliber. Proximal Outflow: The bilateral common, superficial and deep femoral artery is are of normal caliber and widely patent without hemodynamically significant narrowing. Veins: The IVC and pelvic venous system is widely patent. Review of the MIP images confirms the above findings. NON-VASCULAR Lower  chest: Limited visualization of lower thorax demonstrates minimal dependent subpleural ground-glass atelectasis. No focal airspace opacities. No pleural effusion. Borderline cardiomegaly. Post median sternotomy and aortic valve repair. No pericardial effusion. Hepatobiliary: Normal hepatic contour. Grossly unchanged hepatic cysts with dominant cyst within the dome of the right lobe of the liver measuring 5.3 x 3.0 cm an additional cyst within the caudal aspect the right lobe of the liver measuring 2.1 cm. Additional subcentimeter hypoattenuating hepatic lesions are too small to accurately  characterize though favored to represent additional hepatic cysts. Normal appearance of the gallbladder given degree distention. No radiopaque gallstones. No ascites. Pancreas: Normal appearance of the pancreas Spleen: Normal appearance of the spleen. Note is made of a small splenule. Adrenals/Urinary Tract: There is symmetric enhancement and excretion of the bilateral kidneys. Re- demonstrated bilateral renal cysts with dominant exophytic cyst arising from the interpolar aspect the right kidney measuring 8.3 x 6.5 x 10.3 cm and dominant left-sided renal cysts measuring 5.5 x 4.7 x 4.3 cm. No renal stones. Minimal amount of bilateral perinephric stranding. No urinary obstruction. Normal appearance of the bilateral adrenal glands. There is mass effect of the prostate upon the undersurface of the urinary bladder. Otherwise, normal appearance of the urinary bladder. Stomach/Bowel: Rather extensive colonic diverticulosis without evidence of superimposed acute diverticulitis. Moderate colonic stool burden without evidence of enteric obstruction. Normal appearance of the terminal ileum and appendix. No pneumoperitoneum, pneumatosis or portal venous gas. Lymphatic: Scattered retroperitoneal lymph nodes are numerous though individually not enlarged by size criteria. No bulky retroperitoneal, mesenteric, pelvic or inguinal lymphadenopathy. Reproductive: The prostate is enlarged with mass effect on the undersurface urinary bladder. Dystrophic calcifications are seen within the prostate gland. No free fluid in the pelvic cul-de-sac. Other: Tiny mesenteric fat containing periumbilical hernia. Musculoskeletal: No acute or aggressive osseous abnormalities. Moderate severe multilevel lumbar spine DDD, worse at L1-L2, L3-L4 and L5-S1 with disc space height loss, endplate irregularity and sclerosis. A bone island is again noted within the left acetabular roof. IMPRESSION: VASCULAR 1. Post endovascular repair of focal saccular  aneurysm arising from the left side of the infrarenal abdominal aorta without evidence of complication or persistent filling of the excluded focal saccular aneurysm. Aortic aneurysm NOS (ICD10-I71.9). 2.  Aortic Atherosclerosis (ICD10-I70.0). NON-VASCULAR 1. Extensive colonic diverticulosis without evidence of diverticulitis. 2. Grossly unchanged renal and hepatic cysts. 3. The prostatomegaly with mass effect on the undersurface of the urinary bladder. If not recently performed, correlation with DRE is recommended. Electronically Signed   By: Sandi Mariscal M.D.   On: 12/31/2016 13:59    Assessment & Plan:   There are no diagnoses linked to this encounter.   No orders of the defined types were placed in this encounter.    Follow-up: No follow-ups on file.  Walker Kehr, MD

## 2017-12-22 NOTE — Assessment & Plan Note (Signed)
Ice  new shoes Arch supports

## 2017-12-24 DIAGNOSIS — H2513 Age-related nuclear cataract, bilateral: Secondary | ICD-10-CM | POA: Diagnosis not present

## 2017-12-24 DIAGNOSIS — H43811 Vitreous degeneration, right eye: Secondary | ICD-10-CM | POA: Diagnosis not present

## 2017-12-25 DIAGNOSIS — L738 Other specified follicular disorders: Secondary | ICD-10-CM | POA: Diagnosis not present

## 2017-12-25 DIAGNOSIS — L570 Actinic keratosis: Secondary | ICD-10-CM | POA: Diagnosis not present

## 2017-12-25 DIAGNOSIS — L821 Other seborrheic keratosis: Secondary | ICD-10-CM | POA: Diagnosis not present

## 2017-12-25 DIAGNOSIS — L820 Inflamed seborrheic keratosis: Secondary | ICD-10-CM | POA: Diagnosis not present

## 2017-12-25 DIAGNOSIS — Z85828 Personal history of other malignant neoplasm of skin: Secondary | ICD-10-CM | POA: Diagnosis not present

## 2017-12-25 DIAGNOSIS — L245 Irritant contact dermatitis due to other chemical products: Secondary | ICD-10-CM | POA: Diagnosis not present

## 2017-12-25 DIAGNOSIS — L812 Freckles: Secondary | ICD-10-CM | POA: Diagnosis not present

## 2017-12-27 ENCOUNTER — Encounter

## 2017-12-31 ENCOUNTER — Other Ambulatory Visit: Payer: Self-pay | Admitting: Internal Medicine

## 2018-01-11 ENCOUNTER — Ambulatory Visit (INDEPENDENT_AMBULATORY_CARE_PROVIDER_SITE_OTHER): Payer: Medicare Other

## 2018-01-11 ENCOUNTER — Other Ambulatory Visit: Payer: Self-pay

## 2018-01-11 DIAGNOSIS — Z5181 Encounter for therapeutic drug level monitoring: Secondary | ICD-10-CM

## 2018-01-11 DIAGNOSIS — I712 Thoracic aortic aneurysm, without rupture, unspecified: Secondary | ICD-10-CM

## 2018-01-11 DIAGNOSIS — I359 Nonrheumatic aortic valve disorder, unspecified: Secondary | ICD-10-CM

## 2018-01-11 LAB — POCT INR: INR: 1.5 — AB (ref 2.0–3.0)

## 2018-01-11 NOTE — Patient Instructions (Signed)
Description   Continue on same dosage 1 tablet (7.5mg ) daily.  Repeat INR 2 weeks. [place]  Call with any questions or new medications #[phone]

## 2018-01-12 ENCOUNTER — Other Ambulatory Visit: Payer: Self-pay

## 2018-01-12 ENCOUNTER — Encounter: Payer: Self-pay | Admitting: Urology

## 2018-01-12 ENCOUNTER — Ambulatory Visit (INDEPENDENT_AMBULATORY_CARE_PROVIDER_SITE_OTHER): Payer: Medicare Other | Admitting: Urology

## 2018-01-12 VITALS — BP 127/75 | HR 62 | Ht 72.0 in | Wt 173.0 lb

## 2018-01-12 DIAGNOSIS — N138 Other obstructive and reflux uropathy: Secondary | ICD-10-CM

## 2018-01-12 DIAGNOSIS — N401 Enlarged prostate with lower urinary tract symptoms: Secondary | ICD-10-CM

## 2018-01-12 LAB — BLADDER SCAN AMB NON-IMAGING: Scan Result: 102
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
## 2018-01-13 ENCOUNTER — Other Ambulatory Visit (HOSPITAL_COMMUNITY): Payer: Medicare Other

## 2018-01-13 ENCOUNTER — Ambulatory Visit: Payer: Medicare Other | Admitting: Vascular Surgery

## 2018-01-13 ENCOUNTER — Telehealth: Payer: Self-pay | Admitting: Pharmacist

## 2018-01-13 NOTE — Telephone Encounter (Signed)
Patient with diagnosis of mechanical AVR without afib or other risk factors on warfarin for anticoagulation.    Procedure: HOLEP Date of procedure: TBD  Per office protocol, patient can hold warfarin for 5 days prior to procedure.   Patient will not need bridging with Lovenox (enoxaparin) around procedure.

## 2018-01-14 ENCOUNTER — Telehealth: Payer: Self-pay | Admitting: Radiology

## 2018-01-14 NOTE — Telephone Encounter (Signed)
Spoke with surgery center - they need pt off all anticoagulation for minimum of 48 hours post op. Ok to hold warfarin 5 days prior to procedure and resume 2 days after.

## 2018-01-14 NOTE — Telephone Encounter (Signed)
Pt emailed back that will need to be off anticoagulation for 5 days post procedure as well.  LM for Roselyn Reef, Surgery coordinator to determine exact length of hold. Since coumadin takes 3-5 days to assert its effect, we would ideally have patient resume coumadin evening of or day after procedure. Will await call back from surgeon's office before calling patient to discuss.

## 2018-01-14 NOTE — Telephone Encounter (Signed)
Per Dr Diamantina Providence, patient will need to hold warfarin 5 days prior to [facility] & for 48 hours post op. He may have a Loxenox bridge prior to surgery but no anticoagulation for 48 hours post op.

## 2018-01-20 ENCOUNTER — Ambulatory Visit (INDEPENDENT_AMBULATORY_CARE_PROVIDER_SITE_OTHER): Payer: Medicare Other

## 2018-01-20 DIAGNOSIS — I712 Thoracic aortic aneurysm, without rupture, unspecified: Secondary | ICD-10-CM

## 2018-01-20 DIAGNOSIS — I359 Nonrheumatic aortic valve disorder, unspecified: Secondary | ICD-10-CM | POA: Diagnosis not present

## 2018-01-20 DIAGNOSIS — Z5181 Encounter for therapeutic drug level monitoring: Secondary | ICD-10-CM

## 2018-01-20 LAB — POCT INR: INR: 3.5 — AB (ref 2.0–3.0)

## 2018-01-20 NOTE — Patient Instructions (Signed)
Description   Skip today's dosage of Coumadin, then resume same dosage 1 tablet (7.5mg ) daily.  Repeat INR 2 weeks. Going [place] of [place]12[place]9  Call with any questions or new medications #[phone]

## 2018-02-01 ENCOUNTER — Other Ambulatory Visit: Payer: Self-pay | Admitting: Internal Medicine

## 2018-02-01 MED ORDER — CIPROFLOXACIN HCL 500 MG PO TABS: 500.0000 mg | ORAL_TABLET | Freq: Two times a day (BID) | ORAL | 1 refills | Status: AC

## 2018-02-03 ENCOUNTER — Ambulatory Visit (INDEPENDENT_AMBULATORY_CARE_PROVIDER_SITE_OTHER): Payer: Medicare Other | Admitting: *Deleted

## 2018-02-03 DIAGNOSIS — N138 Other obstructive and reflux uropathy: Secondary | ICD-10-CM | POA: Diagnosis not present

## 2018-02-03 DIAGNOSIS — I712 Thoracic aortic aneurysm, without rupture, unspecified: Secondary | ICD-10-CM

## 2018-02-03 DIAGNOSIS — N401 Enlarged prostate with lower urinary tract symptoms: Secondary | ICD-10-CM | POA: Diagnosis not present

## 2018-02-03 DIAGNOSIS — I359 Nonrheumatic aortic valve disorder, unspecified: Secondary | ICD-10-CM

## 2018-02-03 DIAGNOSIS — I714 Abdominal aortic aneurysm, without rupture: Secondary | ICD-10-CM | POA: Diagnosis not present

## 2018-02-03 DIAGNOSIS — Z5181 Encounter for therapeutic drug level monitoring: Secondary | ICD-10-CM

## 2018-02-03 LAB — POCT INR: INR: 3.5 — AB (ref 2.0–3.0)

## 2018-02-03 NOTE — Patient Instructions (Signed)
Description   Skip today's dosage of Coumadin, then resume same dosage 1 tablet (7.5mg ) daily.  Repeat INR 2 weeks.   Call with any questions or new medications #[phone]

## 2018-02-05 DIAGNOSIS — H2513 Age-related nuclear cataract, bilateral: Secondary | ICD-10-CM | POA: Diagnosis not present

## 2018-02-10 ENCOUNTER — Ambulatory Visit (INDEPENDENT_AMBULATORY_CARE_PROVIDER_SITE_OTHER): Payer: Medicare Other | Admitting: Vascular Surgery

## 2018-02-10 ENCOUNTER — Ambulatory Visit (HOSPITAL_COMMUNITY)
Admission: RE | Admit: 2018-02-10 | Discharge: 2018-02-10 | Disposition: A | Discharge status: Home / Self Care | Payer: Medicare Other | Source: Ambulatory Visit | Attending: Vascular Surgery | Admitting: Vascular Surgery

## 2018-02-10 ENCOUNTER — Encounter: Payer: Self-pay | Admitting: Vascular Surgery

## 2018-02-10 ENCOUNTER — Other Ambulatory Visit: Payer: Self-pay

## 2018-02-10 VITALS — BP 113/77 | HR 55 | Temp 98.0°F | Resp 20 | Ht 72.0 in | Wt 173.0 lb

## 2018-02-10 DIAGNOSIS — I714 Abdominal aortic aneurysm, without rupture, unspecified: Secondary | ICD-10-CM

## 2018-02-10 DIAGNOSIS — Z48812 Encounter for surgical aftercare following surgery on the circulatory system: Secondary | ICD-10-CM | POA: Diagnosis not present

## 2018-02-10 NOTE — Progress Notes (Signed)
Patient name: Patrick [name] MRN: [id number] DOB: Sep 29, 1940 Sex: male  REASON FOR VISIT:   Follow-up after endovascular aneurysm repair.  HPI:   Patrick [name] is a pleasant 77 y.o. male who underwent endovascular repair of a saccular aneurysm on 11/28/2016.  He comes in for routine follow-up visit.  He denies any abdominal pain or back pain.  He has had some issues with his prostate which is enlarged and is being considered for an ablation procedure in [place].  There have been no other significant changes to his medical history.  Past Medical History:  Diagnosis Date  . Anticoagulant long-term use   . Aortic insufficiency    a. 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  b. 06/2016: echo showed EF of 60-65%, Grade 1 DD, and normal functioning of the mechanical aortic prosthesis.  Marland Kitchen BPH (benign prostatic hyperplasia)   . Bradycardia    Beta blocker discontinued  . Diverticulosis   . HTN (hypertension)   . Migraine headache   . S/P cardiac cath    [facility] in 2003 demonstrated normal coronary arteries    Family History  Problem Relation Age of Onset  . Cancer Mother        ovarian  . CAD Father   . Colon cancer Neg Hx     SOCIAL HISTORY: Social History   Tobacco Use  . Smoking status: Former Smoker    Packs/day: 1.00    Years: 10.00    Pack years: 10.00    Types: Cigarettes    Last attempt to quit: 08/08/1975    Years since quitting: 42.5  . Smokeless tobacco: Never Used  Substance Use Topics  . Alcohol use: Yes    Alcohol/week: 0.0 standard drinks    Comment: ONLY DRINK ON FRIDAYS    Allergies  Allergen Reactions  . Codeine Rash    Current Outpatient Medications  Medication Sig Dispense Refill  . amLODipine (NORVASC) 5 MG tablet Take 1 tablet (5 mg total) by mouth daily. 90 tablet 3  . amoxicillin (AMOXIL) 500 MG tablet Take 4 tablets by mouth one hour prior to dental appointments 20 tablet 1  . atorvastatin  (LIPITOR) 10 MG tablet Take 1 tablet (10 mg total) by mouth daily. 30 tablet 11  . ciprofloxacin (CIPRO) 500 MG tablet Take 1 tablet (500 mg total) by mouth 2 (two) times daily for 14 days. 28 tablet 1  . desonide (DESOWEN) 0.05 % cream Apply 1 application topically daily as needed. 60 g 3  . diphenoxylate-atropine (LOMOTIL) 2.5-0.025 MG tablet Take 1 tablet by mouth 4 (four) times daily as needed for diarrhea or loose stools. 60 tablet 1  . pantoprazole (PROTONIX) 40 MG tablet TAKE ONE TABLET BY MOUTH DAILY 90 tablet 3  . SUMAtriptan (IMITREX) 50 MG tablet take 1/2 tablet by mouth every 2 hours if needed as directed 9 tablet 3  . tadalafil (CIALIS) 5 MG tablet Take 1 tablet (5 mg total) by mouth daily as needed for erectile dysfunction. (Patient taking differently: Take 5 mg by mouth daily as needed (blood pressure). ) 90 tablet 0  . tamsulosin (FLOMAX) 0.4 MG CAPS capsule Take 0.4 mg by mouth daily.    Marland Kitchen warfarin (COUMADIN) 7.5 MG tablet TAKE ONE TABLET BY MOUTH DAILY 90 tablet 1  . butalbital-acetaminophen-caffeine (FIORICET, ESGIC) 50-325-40 MG tablet Take 1-2 tablets by mouth 2 (two) times daily as needed for headache or migraine. (Patient not taking: Reported  on 02/10/2018) 90 tablet 3   No current facility-administered medications for this visit.     REVIEW OF SYSTEMS:  [X]  denotes positive finding, [ ]  denotes negative finding Cardiac  Comments:  Chest pain or chest pressure:    Shortness of breath upon exertion:    Short of breath when lying flat:    Irregular heart rhythm:        Vascular    Pain in calf, thigh, or hip brought on by ambulation:    Pain in feet at night that wakes you up from your sleep:     Blood clot in your veins:    Leg swelling:         Pulmonary    Oxygen at home:    Productive cough:  x  he has a cold  Wheezing:         Neurologic    Sudden weakness in arms or legs:     Sudden numbness in arms or legs:     Sudden onset of difficulty speaking or  slurred speech:    Temporary loss of vision in one eye:     Problems with dizziness:         Gastrointestinal    Blood in stool:     Vomited blood:         Genitourinary    Burning when urinating:     Blood in urine:        Psychiatric    Major depression:         Hematologic    Bleeding problems:    Problems with blood clotting too easily:        Skin    Rashes or ulcers:        Constitutional    Fever or chills:     PHYSICAL EXAM:   Vitals:   02/10/18 0953  BP: 113/77  Pulse: (!) 55  Resp: 20  Temp: 98 F (36.7 C)  SpO2: 98%  Weight: 173 lb (78.5 kg)  Height: 6' (1.829 m)    GENERAL: The patient is a well-nourished male, in no acute distress. The vital signs are documented above. CARDIAC: There is a regular rate and rhythm.  VASCULAR: I do not detect carotid bruits. He has palpable femoral pulses.  Both feet are warm and well-perfused.  He has no significant lower extremity swelling. PULMONARY: There is good air exchange bilaterally without wheezing or rales. ABDOMEN: Soft and non-tender with normal pitched bowel sounds.  MUSCULOSKELETAL: There are no major deformities or cyanosis. NEUROLOGIC: No focal weakness or paresthesias are detected. SKIN: There are no ulcers or rashes noted. PSYCHIATRIC: The patient has a normal affect.  DATA:    DUPLEX ABDOMINAL AORTA: I have independently interpreted his duplex of the abdominal aorta today.  The maximum diameter of his infrarenal aorta is 2.56 cm.  No complicating features are noted.  MEDICAL ISSUES:   STATUS POST ENDOVASCULAR ANEURYSM REPAIR: The patient is doing well status post endovascular repair of his saccular aneurysm.  I think we can stretch his follow-up out to 1 year and I have ordered a follow-up carotid duplex scan at that time.  He knows to call sooner if he has problems.  Fortunately he is not a smoker.  He is on a statin.  He is not on aspirin because he is on Coumadin for his aortic valve  replacement.  Deitra Mayo [facility] [phone]

## 2018-02-11 DIAGNOSIS — L570 Actinic keratosis: Secondary | ICD-10-CM | POA: Diagnosis not present

## 2018-02-11 DIAGNOSIS — Z85828 Personal history of other malignant neoplasm of skin: Secondary | ICD-10-CM | POA: Diagnosis not present

## 2018-02-11 DIAGNOSIS — L820 Inflamed seborrheic keratosis: Secondary | ICD-10-CM | POA: Diagnosis not present

## 2018-02-19 ENCOUNTER — Ambulatory Visit (INDEPENDENT_AMBULATORY_CARE_PROVIDER_SITE_OTHER): Payer: Medicare Other | Admitting: Pharmacist

## 2018-02-19 DIAGNOSIS — Z5181 Encounter for therapeutic drug level monitoring: Secondary | ICD-10-CM | POA: Diagnosis not present

## 2018-02-19 DIAGNOSIS — I712 Thoracic aortic aneurysm, without rupture, unspecified: Secondary | ICD-10-CM

## 2018-02-19 DIAGNOSIS — I359 Nonrheumatic aortic valve disorder, unspecified: Secondary | ICD-10-CM | POA: Diagnosis not present

## 2018-02-19 LAB — POCT INR: INR: 3.7 — AB (ref 2.0–3.0)

## 2018-02-19 NOTE — Patient Instructions (Signed)
Description   Skip today's dosage of Coumadin, then start taking 1 tablet daily except for 1/2 tablet on Sundays.  Repeat INR 2 weeks.   Call with any questions or new medications #[phone]

## 2018-02-26 DIAGNOSIS — N139 Obstructive and reflux uropathy, unspecified: Secondary | ICD-10-CM | POA: Diagnosis not present

## 2018-02-26 DIAGNOSIS — I714 Abdominal aortic aneurysm, without rupture: Secondary | ICD-10-CM | POA: Diagnosis not present

## 2018-02-26 DIAGNOSIS — N138 Other obstructive and reflux uropathy: Secondary | ICD-10-CM | POA: Diagnosis not present

## 2018-02-26 DIAGNOSIS — N401 Enlarged prostate with lower urinary tract symptoms: Secondary | ICD-10-CM | POA: Diagnosis not present

## 2018-03-01 ENCOUNTER — Other Ambulatory Visit: Payer: Self-pay | Admitting: Cardiovascular Disease

## 2018-03-04 ENCOUNTER — Telehealth: Payer: Self-pay

## 2018-03-04 NOTE — Telephone Encounter (Signed)
Pt called states he is having a prostate procedure on 04/13/2018 by Dr Joellen Jersey at [facility], [facility]ual[facility] and interventional radiology clinic.  Manuela Schwartz is his nurse and primary contact [phone].  They have requested pt to hold Coumadin 5 days prior to procedure, want INR less than 1.8 per pt report.  Pt has upcoming appt tomorrow with Coumadin Clinic.

## 2018-03-04 NOTE — Telephone Encounter (Signed)
Patient with diagnosis of mechanical AVR without afib or other risk factors on warfarin for anticoagulation.    Procedure: prostate surgery Date of procedure: 04/13/18  Per office protocol, patient can hold warfarin for 5 days prior to procedure.    Patient will not need bridging with Lovenox (enoxaparin) around procedure.

## 2018-03-05 ENCOUNTER — Ambulatory Visit (INDEPENDENT_AMBULATORY_CARE_PROVIDER_SITE_OTHER): Payer: Medicare Other | Admitting: *Deleted

## 2018-03-05 DIAGNOSIS — I712 Thoracic aortic aneurysm, without rupture, unspecified: Secondary | ICD-10-CM

## 2018-03-05 DIAGNOSIS — Z5181 Encounter for therapeutic drug level monitoring: Secondary | ICD-10-CM

## 2018-03-05 DIAGNOSIS — I359 Nonrheumatic aortic valve disorder, unspecified: Secondary | ICD-10-CM | POA: Diagnosis not present

## 2018-03-05 LAB — POCT INR: INR: 2.4 (ref 2.0–3.0)

## 2018-03-05 NOTE — Patient Instructions (Signed)
Description   Continue taking 1 tablet daily.  Repeat INR 4 weeks.   Call with any questions or new medications #[phone]

## 2018-03-15 ENCOUNTER — Ambulatory Visit: Payer: Medicare Other | Admitting: Urology

## 2018-03-16 DIAGNOSIS — M79675 Pain in left toe(s): Secondary | ICD-10-CM | POA: Diagnosis not present

## 2018-03-16 DIAGNOSIS — L602 Onychogryphosis: Secondary | ICD-10-CM | POA: Diagnosis not present

## 2018-03-16 DIAGNOSIS — M79674 Pain in right toe(s): Secondary | ICD-10-CM | POA: Diagnosis not present

## 2018-03-16 DIAGNOSIS — B351 Tinea unguium: Secondary | ICD-10-CM | POA: Diagnosis not present

## 2018-03-16 DIAGNOSIS — L603 Nail dystrophy: Secondary | ICD-10-CM | POA: Diagnosis not present

## 2018-03-16 DIAGNOSIS — B353 Tinea pedis: Secondary | ICD-10-CM | POA: Diagnosis not present

## 2018-03-16 NOTE — Telephone Encounter (Signed)
-----   Message from Billey Co, MD sent at 03/16/2018  9:16 AM EST ----- Regarding: FOLLOW UP Please schedule follow up w/me in 4 months with IPSS and PVR, thanks  Nickolas Madrid, MD 03/16/2018

## 2018-03-16 NOTE — Telephone Encounter (Signed)
App made and mailed to patient   MV

## 2018-03-26 DIAGNOSIS — Z85828 Personal history of other malignant neoplasm of skin: Secondary | ICD-10-CM | POA: Diagnosis not present

## 2018-03-26 DIAGNOSIS — L820 Inflamed seborrheic keratosis: Secondary | ICD-10-CM | POA: Diagnosis not present

## 2018-03-26 DIAGNOSIS — L853 Xerosis cutis: Secondary | ICD-10-CM | POA: Diagnosis not present

## 2018-04-05 ENCOUNTER — Ambulatory Visit (INDEPENDENT_AMBULATORY_CARE_PROVIDER_SITE_OTHER): Payer: Medicare Other | Admitting: Pharmacist

## 2018-04-05 DIAGNOSIS — I359 Nonrheumatic aortic valve disorder, unspecified: Secondary | ICD-10-CM

## 2018-04-05 DIAGNOSIS — Z5181 Encounter for therapeutic drug level monitoring: Secondary | ICD-10-CM

## 2018-04-05 DIAGNOSIS — I712 Thoracic aortic aneurysm, without rupture, unspecified: Secondary | ICD-10-CM

## 2018-04-05 LAB — POCT INR: INR: 2.5 (ref 2.0–3.0)

## 2018-04-05 NOTE — Patient Instructions (Addendum)
Continue taking 1 tablet daily.  Hold coumadin starting 2/13. Resume when coumadin when instructed by doctor preforming procedure. Repeat INR on 2/27. Call with any questions or new medications #[phone]

## 2018-04-13 DIAGNOSIS — N138 Other obstructive and reflux uropathy: Secondary | ICD-10-CM | POA: Diagnosis not present

## 2018-04-13 DIAGNOSIS — N401 Enlarged prostate with lower urinary tract symptoms: Secondary | ICD-10-CM | POA: Diagnosis not present
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
## 2018-04-19 ENCOUNTER — Ambulatory Visit (INDEPENDENT_AMBULATORY_CARE_PROVIDER_SITE_OTHER)
Admission: RE | Admit: 2018-04-19 | Discharge: 2018-04-19 | Disposition: A | Discharge status: Home / Self Care | Payer: Medicare Other | Source: Ambulatory Visit | Attending: Family Medicine | Admitting: Family Medicine

## 2018-04-19 ENCOUNTER — Ambulatory Visit (INDEPENDENT_AMBULATORY_CARE_PROVIDER_SITE_OTHER): Payer: Medicare Other | Admitting: Family Medicine

## 2018-04-19 ENCOUNTER — Encounter: Payer: Self-pay | Admitting: Family Medicine

## 2018-04-19 VITALS — BP 120/82 | HR 59 | Ht 72.0 in | Wt 177.0 lb

## 2018-04-19 DIAGNOSIS — M7050 Other bursitis of knee, unspecified knee: Secondary | ICD-10-CM | POA: Insufficient documentation

## 2018-04-19 DIAGNOSIS — M25561 Pain in right knee: Secondary | ICD-10-CM

## 2018-04-19 DIAGNOSIS — G8929 Other chronic pain: Secondary | ICD-10-CM

## 2018-04-19 NOTE — Patient Instructions (Signed)
Good to see you  Pes anserine bursitis Thigh compression sleeve with a lot of walking or working out.  [email or web address] size medium pennsaid pinkie amount topically 2 times daily as needed.  Xray downstairs Exercises 3 times a week.  Avoid repetitive extension of the knee.  Maybe more biking or elliptical  See me again in 4 weeks

## 2018-04-19 NOTE — Assessment & Plan Note (Signed)
Past anserine bursitis.  We discussed icing regimen and home exercise, topical anti-inflammatories, discussed which activities of doing which wants to avoid.  If worsening pain we will consider injection.  Follow-up again in 4 weeks

## 2018-04-22 ENCOUNTER — Ambulatory Visit (INDEPENDENT_AMBULATORY_CARE_PROVIDER_SITE_OTHER): Payer: Medicare Other | Admitting: *Deleted

## 2018-04-22 DIAGNOSIS — I359 Nonrheumatic aortic valve disorder, unspecified: Secondary | ICD-10-CM

## 2018-04-22 DIAGNOSIS — I712 Thoracic aortic aneurysm, without rupture, unspecified: Secondary | ICD-10-CM

## 2018-04-22 DIAGNOSIS — Z5181 Encounter for therapeutic drug level monitoring: Secondary | ICD-10-CM

## 2018-04-22 LAB — POCT INR: INR: 2.2 (ref 2.0–3.0)

## 2018-04-22 NOTE — Patient Instructions (Signed)
Description   Continue taking 1 tablet daily.  Recheck INR in 4 weeks. Call with any questions or new medications #[phone]

## 2018-05-17 ENCOUNTER — Ambulatory Visit: Payer: Medicare Other | Admitting: Family Medicine

## 2018-05-18 ENCOUNTER — Telehealth: Payer: Self-pay

## 2018-05-18 NOTE — Telephone Encounter (Signed)

## 2018-05-20 ENCOUNTER — Other Ambulatory Visit: Payer: Self-pay

## 2018-05-20 ENCOUNTER — Ambulatory Visit (INDEPENDENT_AMBULATORY_CARE_PROVIDER_SITE_OTHER): Payer: Medicare Other

## 2018-05-20 DIAGNOSIS — I359 Nonrheumatic aortic valve disorder, unspecified: Secondary | ICD-10-CM

## 2018-05-20 DIAGNOSIS — I712 Thoracic aortic aneurysm, without rupture, unspecified: Secondary | ICD-10-CM

## 2018-05-20 DIAGNOSIS — Z5181 Encounter for therapeutic drug level monitoring: Secondary | ICD-10-CM | POA: Diagnosis not present

## 2018-05-20 LAB — POCT INR: INR: 3.7 — AB (ref 2.0–3.0)

## 2018-05-20 NOTE — Patient Instructions (Signed)
Description   Called spoke with pt advised to skip today's dosage of Coumadin, then resume same dosage 1 tablet daily.  Recheck INR in 3 weeks. Call with any questions or new medications #[phone]

## 2018-06-02 ENCOUNTER — Other Ambulatory Visit: Payer: Self-pay | Admitting: Cardiovascular Disease

## 2018-06-02 IMAGING — DX DG CHEST 1V PORT
1 series · 1 of 1 positions shown · non-contrast
Comparison: 02/26/2012

CLINICAL DATA: Postop.  Aortic stent graft.

EXAM:
PORTABLE CHEST 1 VIEW

[chest]
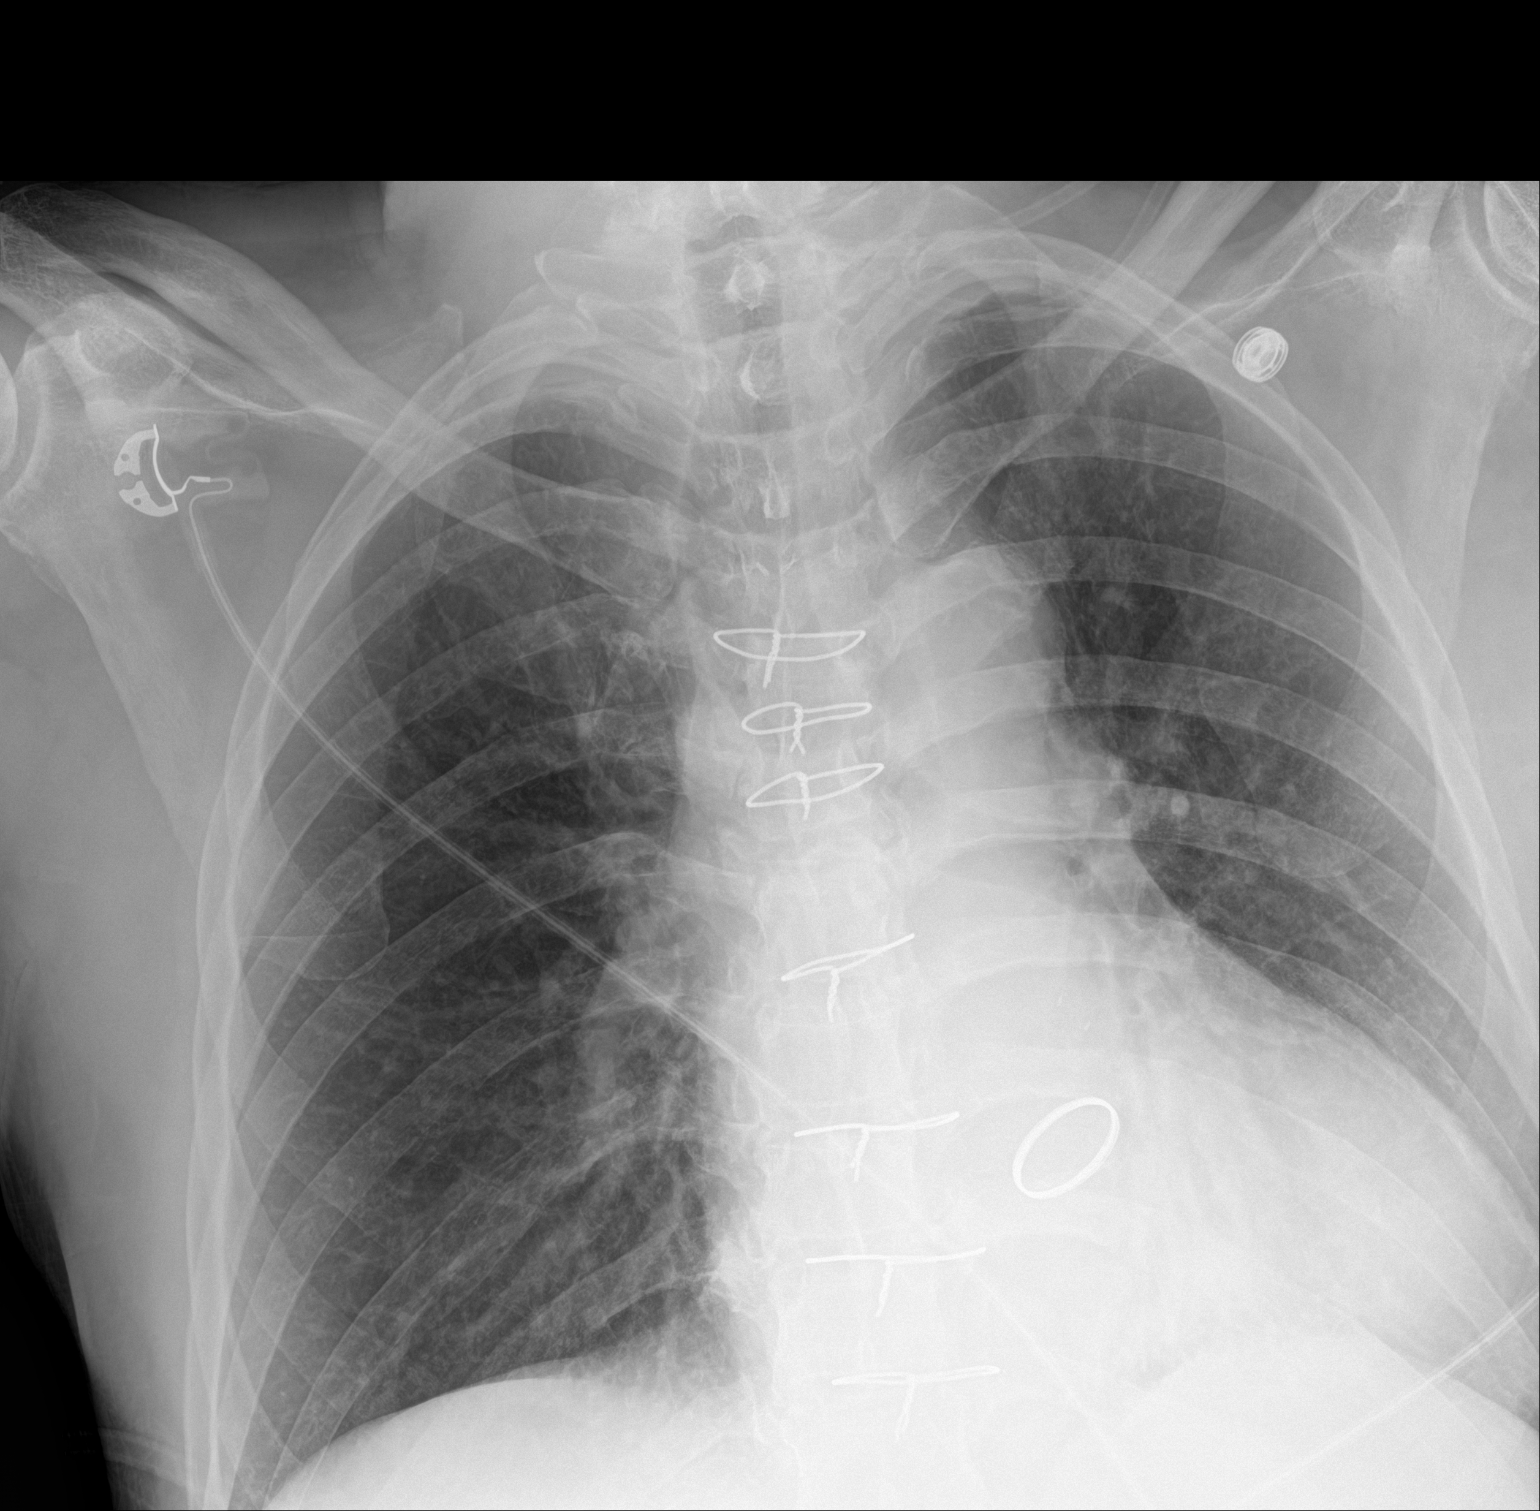

[1 of 1 positions shown; findings below may reference images not displayed]

FINDINGS: Postop changes from aortic valve replacement are noted. The heart is
mildly enlarged. Normal vascularity. Visualize lungs are clear. Left
costophrenic angle excluded from the study. No pneumothorax.
IMPRESSION: Cardiomegaly without edema.

## 2018-06-07 ENCOUNTER — Telehealth: Payer: Self-pay

## 2018-06-07 NOTE — Telephone Encounter (Signed)

## 2018-06-08 ENCOUNTER — Ambulatory Visit (INDEPENDENT_AMBULATORY_CARE_PROVIDER_SITE_OTHER): Payer: Medicare Other

## 2018-06-08 ENCOUNTER — Other Ambulatory Visit: Payer: Self-pay

## 2018-06-08 DIAGNOSIS — I712 Thoracic aortic aneurysm, without rupture, unspecified: Secondary | ICD-10-CM

## 2018-06-08 DIAGNOSIS — Z5181 Encounter for therapeutic drug level monitoring: Secondary | ICD-10-CM | POA: Diagnosis not present

## 2018-06-08 DIAGNOSIS — I359 Nonrheumatic aortic valve disorder, unspecified: Secondary | ICD-10-CM | POA: Diagnosis not present

## 2018-06-08 LAB — POCT INR: INR: 2.7 (ref 2.0–3.0)

## 2018-06-08 NOTE — Patient Instructions (Signed)
Description   Called spoke with pt advised to continue on same dosage 1 tablet daily.  Recheck INR in 4 weeks. Call with any questions or new medications #[phone]

## 2018-06-22 DIAGNOSIS — L308 Other specified dermatitis: Secondary | ICD-10-CM | POA: Diagnosis not present

## 2018-06-22 DIAGNOSIS — L821 Other seborrheic keratosis: Secondary | ICD-10-CM | POA: Diagnosis not present

## 2018-06-22 DIAGNOSIS — L812 Freckles: Secondary | ICD-10-CM | POA: Diagnosis not present

## 2018-06-22 DIAGNOSIS — Z85828 Personal history of other malignant neoplasm of skin: Secondary | ICD-10-CM | POA: Diagnosis not present

## 2018-06-22 DIAGNOSIS — L570 Actinic keratosis: Secondary | ICD-10-CM | POA: Diagnosis not present

## 2018-07-05 ENCOUNTER — Telehealth: Payer: Self-pay

## 2018-07-05 NOTE — Telephone Encounter (Signed)

## 2018-07-06 ENCOUNTER — Ambulatory Visit (INDEPENDENT_AMBULATORY_CARE_PROVIDER_SITE_OTHER): Payer: Medicare Other

## 2018-07-06 ENCOUNTER — Other Ambulatory Visit: Payer: Self-pay

## 2018-07-06 DIAGNOSIS — Z5181 Encounter for therapeutic drug level monitoring: Secondary | ICD-10-CM | POA: Diagnosis not present

## 2018-07-06 DIAGNOSIS — I359 Nonrheumatic aortic valve disorder, unspecified: Secondary | ICD-10-CM

## 2018-07-06 DIAGNOSIS — I712 Thoracic aortic aneurysm, without rupture, unspecified: Secondary | ICD-10-CM

## 2018-07-06 LAB — POCT INR: INR: 2.8 (ref 2.0–3.0)

## 2018-07-06 NOTE — Patient Instructions (Signed)
Description   Called spoke with pt advised to continue on same dosage 1 tablet daily.  Recheck INR in 6 weeks. Call with any questions or new medications #[phone]

## 2018-07-15 ENCOUNTER — Ambulatory Visit: Payer: Medicare Other | Admitting: Urology

## 2018-07-20 ENCOUNTER — Ambulatory Visit: Payer: Medicare Other | Admitting: Urology

## 2018-07-26 ENCOUNTER — Other Ambulatory Visit: Payer: Self-pay

## 2018-07-26 ENCOUNTER — Other Ambulatory Visit: Payer: Medicare Other

## 2018-07-26 DIAGNOSIS — N401 Enlarged prostate with lower urinary tract symptoms: Secondary | ICD-10-CM

## 2018-07-26 DIAGNOSIS — N138 Other obstructive and reflux uropathy: Secondary | ICD-10-CM | POA: Diagnosis not present

## 2018-07-27 LAB — PSA: Prostate Specific Ag, Serum: 1.7 ng/mL (ref 0.0–4.0)

## 2018-08-04 DIAGNOSIS — H2513 Age-related nuclear cataract, bilateral: Secondary | ICD-10-CM | POA: Diagnosis not present

## 2018-08-10 ENCOUNTER — Telehealth: Payer: Self-pay

## 2018-08-10 ENCOUNTER — Other Ambulatory Visit: Payer: Self-pay | Admitting: Internal Medicine

## 2018-08-10 MED ORDER — TADALAFIL 20 MG PO TABS: 20.0000 mg | ORAL_TABLET | ORAL | 5 refills | Status: DC | PRN

## 2018-08-10 NOTE — Telephone Encounter (Signed)

## 2018-08-16 ENCOUNTER — Other Ambulatory Visit: Payer: Self-pay

## 2018-08-16 ENCOUNTER — Ambulatory Visit (INDEPENDENT_AMBULATORY_CARE_PROVIDER_SITE_OTHER): Payer: Medicare Other | Admitting: Urology

## 2018-08-16 ENCOUNTER — Encounter: Payer: Self-pay | Admitting: Urology

## 2018-08-16 VITALS — BP 154/84 | HR 77 | Ht 72.0 in | Wt 170.0 lb

## 2018-08-16 DIAGNOSIS — N401 Enlarged prostate with lower urinary tract symptoms: Secondary | ICD-10-CM | POA: Diagnosis not present

## 2018-08-16 DIAGNOSIS — N138 Other obstructive and reflux uropathy: Secondary | ICD-10-CM

## 2018-08-16 DIAGNOSIS — N529 Male erectile dysfunction, unspecified: Secondary | ICD-10-CM | POA: Diagnosis not present

## 2018-08-16 LAB — BLADDER SCAN AMB NON-IMAGING

## 2018-08-16 NOTE — Progress Notes (Signed)
   08/16/2018 2:05 PM   Patrick [name] Jul 04, 1940 [id number]  Reason for visit: Follow up BPH  HPI: I saw Patrick [name] back in urology clinic for follow-up of BPH.  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  He was initially evaluated by [facility] in [place] with a negative biopsy for PSA of 8, with ultrasound showing 120 g prostate.  He is extremely pleased with his urinary symptoms after PAE, and he reports he is voiding with a strong stream, emptying well, and nocturia has resolved.  PVR in clinic today was 39 cc.  He does have some worsening erectile dysfunction after PAT, and was recently prescribed 20 mg on demand Cialis by his PCP.  PSA 07/26/2018 was 1.7 after PAE.    ROS: Please see flowsheet from today's date for complete review of systems.  Physical Exam: BP (!) 154/84 (BP Location: Left Arm, Patient Position: Sitting)   Pulse 77   Ht 6' (1.829 m)   Wt 170 lb (77.1 kg)   BMI 23.06 kg/m    Constitutional:  Alert and oriented, No acute distress. Respiratory: Normal respiratory effort, no increased work of breathing. GI: Abdomen is soft, nontender, nondistended, no abdominal masses Skin: No rashes, bruises or suspicious lesions. Neurologic: Grossly intact, no focal deficits, moving all 4 extremities. 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  He has had complete resolution of his urinary symptoms and is currently doing very well.  He was recently prescribed a trial of 20 mg on demand Cialis by his PCP.  He is also considering getting shockwave penile treatments for ED at a men's health clinic in [place].  RTC 1 year for PVR  A total of 10  minutes were spent face-to-face with the patient, greater than 50% was spent in patient education, counseling, and coordination of care regarding BPH, PAE, and options for erectile dysfunction.   Billey Co, [facility] [address], [place], [place] [address] [phone]

## 2018-08-17 ENCOUNTER — Ambulatory Visit (INDEPENDENT_AMBULATORY_CARE_PROVIDER_SITE_OTHER): Payer: Medicare Other

## 2018-08-17 DIAGNOSIS — I712 Thoracic aortic aneurysm, without rupture, unspecified: Secondary | ICD-10-CM

## 2018-08-17 DIAGNOSIS — I359 Nonrheumatic aortic valve disorder, unspecified: Secondary | ICD-10-CM | POA: Diagnosis not present

## 2018-08-17 DIAGNOSIS — Z5181 Encounter for therapeutic drug level monitoring: Secondary | ICD-10-CM

## 2018-08-17 LAB — POCT INR: INR: 3 (ref 2.0–3.0)

## 2018-08-17 NOTE — Patient Instructions (Signed)
Description   Continue on same dosage 1 tablet daily.  Recheck INR in 6 weeks. Call with any questions or new medications #[phone]

## 2018-08-30 ENCOUNTER — Other Ambulatory Visit: Payer: Self-pay

## 2018-08-30 ENCOUNTER — Encounter: Payer: Self-pay | Admitting: Internal Medicine

## 2018-08-30 ENCOUNTER — Other Ambulatory Visit (INDEPENDENT_AMBULATORY_CARE_PROVIDER_SITE_OTHER): Payer: Medicare Other

## 2018-08-30 ENCOUNTER — Ambulatory Visit (INDEPENDENT_AMBULATORY_CARE_PROVIDER_SITE_OTHER): Payer: Medicare Other | Admitting: Internal Medicine

## 2018-08-30 ENCOUNTER — Other Ambulatory Visit: Payer: Self-pay | Admitting: Internal Medicine

## 2018-08-30 DIAGNOSIS — R972 Elevated prostate specific antigen [PSA]: Secondary | ICD-10-CM | POA: Diagnosis not present

## 2018-08-30 DIAGNOSIS — I10 Essential (primary) hypertension: Secondary | ICD-10-CM | POA: Diagnosis not present

## 2018-08-30 DIAGNOSIS — N401 Enlarged prostate with lower urinary tract symptoms: Secondary | ICD-10-CM

## 2018-08-30 DIAGNOSIS — Z7901 Long term (current) use of anticoagulants: Secondary | ICD-10-CM | POA: Diagnosis not present

## 2018-08-30 DIAGNOSIS — R1011 Right upper quadrant pain: Secondary | ICD-10-CM | POA: Insufficient documentation

## 2018-08-30 DIAGNOSIS — D696 Thrombocytopenia, unspecified: Secondary | ICD-10-CM

## 2018-08-30 DIAGNOSIS — M79644 Pain in right finger(s): Secondary | ICD-10-CM | POA: Insufficient documentation

## 2018-08-30 DIAGNOSIS — R338 Other retention of urine: Secondary | ICD-10-CM

## 2018-08-30 LAB — HEPATIC FUNCTION PANEL
ALT: 19 U/L (ref 0–53)
AST: 18 U/L (ref 0–37)
Albumin: 4.6 g/dL (ref 3.5–5.2)
Alkaline Phosphatase: 81 U/L (ref 39–117)
Bilirubin, Direct: 0.2 mg/dL (ref 0.0–0.3)
Total Bilirubin: 0.8 mg/dL (ref 0.2–1.2)
Total Protein: 6.7 g/dL (ref 6.0–8.3)

## 2018-08-30 LAB — CBC WITH DIFFERENTIAL/PLATELET
Basophils Absolute: 0 10*3/uL (ref 0.0–0.1)
Basophils Relative: 0.2 % (ref 0.0–3.0)
Eosinophils Absolute: 0 10*3/uL (ref 0.0–0.7)
Eosinophils Relative: 1.3 % (ref 0.0–5.0)
HCT: 41.9 % (ref 39.0–52.0)
Hemoglobin: 14.5 g/dL (ref 13.0–17.0)
Lymphocytes Relative: 15.2 % (ref 12.0–46.0)
Lymphs Abs: 0.4 10*3/uL — ABNORMAL LOW (ref 0.7–4.0)
MCHC: 34.5 g/dL (ref 30.0–36.0)
MCV: 101 fl — ABNORMAL HIGH (ref 78.0–100.0)
Monocytes Absolute: 0.3 10*3/uL (ref 0.1–1.0)
Monocytes Relative: 8.8 % (ref 3.0–12.0)
Neutro Abs: 2.2 10*3/uL (ref 1.4–7.7)
Neutrophils Relative %: 74.5 % (ref 43.0–77.0)
Platelets: 87 10*3/uL — ABNORMAL LOW (ref 150.0–400.0)
RBC: 4.15 Mil/uL — ABNORMAL LOW (ref 4.22–5.81)
RDW: 14.3 % (ref 11.5–15.5)
WBC: 2.9 10*3/uL — ABNORMAL LOW (ref 4.0–10.5)

## 2018-08-30 LAB — BASIC METABOLIC PANEL
BUN: 17 mg/dL (ref 6–23)
CO2: 30 mEq/L (ref 19–32)
Calcium: 9.8 mg/dL (ref 8.4–10.5)
Chloride: 105 mEq/L (ref 96–112)
Creatinine, Ser: 1.32 mg/dL (ref 0.40–1.50)
GFR: 52.72 mL/min — ABNORMAL LOW (ref >60.00)
Glucose, Bld: 88 mg/dL (ref 70–99)
Potassium: 4.1 mEq/L (ref 3.5–5.1)
Sodium: 141 mEq/L (ref 135–145)

## 2018-08-30 NOTE — Assessment & Plan Note (Signed)
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

## 2018-08-30 NOTE — Assessment & Plan Note (Signed)
S/p PAE (prostate artery embolization)

## 2018-08-30 NOTE — Progress Notes (Signed)
Hematology consultation

## 2018-08-30 NOTE — Assessment & Plan Note (Signed)
S/p AVR

## 2018-08-30 NOTE — Assessment & Plan Note (Signed)
On Amlodipine 

## 2018-08-30 NOTE — Progress Notes (Signed)
Subjective:  Patient ID: Patrick [name], male    DOB: 07/21/1940  Age: 78 y.o. MRN: [id number]  CC: No chief complaint on file.   HPI Patrick [name] presents for pain in the RUQ x 2 weeks like knife lasting seconds sporadic. No n/v, fever. C/o R index finger turning blue  Outpatient Medications Prior to Visit  Medication Sig Dispense Refill  . amLODipine (NORVASC) 5 MG tablet Take 1 tablet (5 mg total) by mouth daily. 90 tablet 3  . amoxicillin (AMOXIL) 500 MG tablet Take 4 tablets by mouth one hour prior to dental appointments 20 tablet 1  . atorvastatin (LIPITOR) 10 MG tablet Take 1 tablet (10 mg total) by mouth daily. 30 tablet 11  . butalbital-acetaminophen-caffeine (FIORICET, ESGIC) 50-325-40 MG tablet Take 1-2 tablets by mouth 2 (two) times daily as needed for headache or migraine. 90 tablet 3  . desonide (DESOWEN) 0.05 % cream Apply 1 application topically daily as needed. 60 g 3  . diphenoxylate-atropine (LOMOTIL) 2.5-0.025 MG tablet Take 1 tablet by mouth 4 (four) times daily as needed for diarrhea or loose stools. 60 tablet 1  . pantoprazole (PROTONIX) 40 MG tablet TAKE ONE TABLET BY MOUTH DAILY 90 tablet 3  . SUMAtriptan (IMITREX) 50 MG tablet take 1/2 tablet by mouth every 2 hours if needed as directed 9 tablet 3  . tadalafil (CIALIS) 5 MG tablet Take 5 mg by mouth daily as needed (BPH).    Marland Kitchen warfarin (COUMADIN) 7.5 MG tablet TAKE 1 TABLET BY MOUTH DAILY 90 tablet 0   No facility-administered medications prior to visit.     ROS: Review of Systems  Constitutional: Negative for appetite change, fatigue and unexpected weight change.  HENT: Negative for congestion, nosebleeds, sneezing, sore throat and trouble swallowing.   Eyes: Negative for itching and visual disturbance.  Respiratory: Negative for cough.   Cardiovascular: Negative for chest pain, palpitations and leg swelling.  Gastrointestinal: Positive for abdominal pain. Negative for abdominal distention, blood in  stool, diarrhea and nausea.  Genitourinary: Negative for frequency and hematuria.  Musculoskeletal: Positive for arthralgias. Negative for back pain, gait problem, joint swelling and neck pain.  Skin: Negative for rash.  Neurological: Negative for dizziness, tremors, speech difficulty and weakness.  Psychiatric/Behavioral: Negative for agitation, dysphoric mood and sleep disturbance. The patient is not nervous/anxious.     Objective:  BP 126/64 (BP Location: Left Arm, Patient Position: Sitting, Cuff Size: Normal)   Pulse (!) 59   Temp 98.1 F (36.7 C) (Oral)   Ht 6' (1.829 m)   Wt 170 lb (77.1 kg)   SpO2 98%   BMI 23.06 kg/m   BP Readings from Last 3 Encounters:  08/30/18 126/64  08/16/18 (!) 154/84  04/19/18 120/82    Wt Readings from Last 3 Encounters:  08/30/18 170 lb (77.1 kg)  08/16/18 170 lb (77.1 kg)  04/19/18 177 lb (80.3 kg)    Physical Exam Constitutional:      General: He is not in acute distress.    Appearance: He is well-developed.     Comments: NAD  Eyes:     Conjunctiva/sclera: Conjunctivae normal.     Pupils: Pupils are equal, round, and reactive to light.  Neck:     Musculoskeletal: Normal range of motion.     Thyroid: No thyromegaly.     Vascular: No JVD.  Cardiovascular:     Rate and Rhythm: Normal rate and regular rhythm.     Heart sounds: Normal heart sounds.  No murmur. No friction rub. No gallop.   Pulmonary:     Effort: Pulmonary effort is normal. No respiratory distress.     Breath sounds: Normal breath sounds. No wheezing or rales.  Chest:     Chest wall: No tenderness.  Abdominal:     General: Bowel sounds are normal. There is no distension.     Palpations: Abdomen is soft. There is no mass.     Tenderness: There is no abdominal tenderness. There is no guarding or rebound.  Musculoskeletal: Normal range of motion.        General: No tenderness.  Lymphadenopathy:     Cervical: No cervical adenopathy.  Skin:    General: Skin is warm  and dry.     Findings: No rash.  Neurological:     Mental Status: He is alert and oriented to person, place, and time.     Cranial Nerves: No cranial nerve deficit.     Motor: No abnormal muscle tone.     Coordination: Coordination normal.     Gait: Gait normal.     Deep Tendon Reflexes: Reflexes are normal and symmetric.  Psychiatric:        Behavior: Behavior normal.        Thought Content: Thought content normal.        Judgment: Judgment normal.   RUQ abd wall is w/pain R index DIP w/OA  Lab Results  Component Value Date   WBC 3.6 (L) 12/22/2017   HGB 15.1 12/22/2017   HCT 42.8 12/22/2017   PLT 101.0 (L) 12/22/2017   GLUCOSE 101 (H) 12/22/2017   CHOL 139 12/22/2017   TRIG 214.0 (H) 12/22/2017   HDL 45.80 12/22/2017   LDLDIRECT 67.0 12/22/2017   LDLCALC 69 11/07/2016   ALT 21 12/22/2017   AST 19 12/22/2017   NA 140 12/22/2017   K 3.9 12/22/2017   CL 103 12/22/2017   CREATININE 1.36 12/22/2017   BUN 17 12/22/2017   CO2 31 12/22/2017   TSH 0.82 12/22/2017   PSA 7.43 (H) 12/22/2017   INR 3.0 08/17/2018   HGBA1C 5.3 08/20/2016    Dg Knee Complete 4 Views Right  Result Date: 04/19/2018 CLINICAL DATA:  Chronic right knee pain.  No injury. EXAM: RIGHT KNEE - COMPLETE 4+ VIEW COMPARISON:  None. FINDINGS: Mild degenerative changes over the medial compartment and patellofemoral joints. No evidence of joint effusion. No fracture or dislocation. IMPRESSION: No acute findings. Mild osteoarthritic change. Electronically Signed   By: Marin Olp M.D.   On: 04/19/2018 17:57    Assessment & Plan:   There are no diagnoses linked to this encounter.   No orders of the defined types were placed in this encounter.    Follow-up: No follow-ups on file.  Walker Kehr, MD

## 2018-09-02 ENCOUNTER — Other Ambulatory Visit: Payer: Self-pay | Admitting: Cardiovascular Disease

## 2018-09-16 ENCOUNTER — Ambulatory Visit
Admission: RE | Admit: 2018-09-16 | Discharge: 2018-09-16 | Disposition: A | Discharge status: Home / Self Care | Payer: Medicare Other | Source: Ambulatory Visit | Attending: Internal Medicine | Admitting: Internal Medicine

## 2018-09-16 DIAGNOSIS — R1011 Right upper quadrant pain: Secondary | ICD-10-CM

## 2018-09-16 DIAGNOSIS — K8020 Calculus of gallbladder without cholecystitis without obstruction: Secondary | ICD-10-CM | POA: Diagnosis not present

## 2018-09-16 DIAGNOSIS — K7689 Other specified diseases of liver: Secondary | ICD-10-CM | POA: Diagnosis not present

## 2018-09-16 DIAGNOSIS — K824 Cholesterolosis of gallbladder: Secondary | ICD-10-CM | POA: Diagnosis not present

## 2018-09-21 ENCOUNTER — Other Ambulatory Visit: Payer: Self-pay

## 2018-09-21 ENCOUNTER — Ambulatory Visit (INDEPENDENT_AMBULATORY_CARE_PROVIDER_SITE_OTHER): Payer: Medicare Other | Admitting: Internal Medicine

## 2018-09-21 ENCOUNTER — Telehealth: Payer: Self-pay | Admitting: Internal Medicine

## 2018-09-21 DIAGNOSIS — K8020 Calculus of gallbladder without cholecystitis without obstruction: Secondary | ICD-10-CM

## 2018-09-21 DIAGNOSIS — J069 Acute upper respiratory infection, unspecified: Secondary | ICD-10-CM

## 2018-09-21 DIAGNOSIS — R1011 Right upper quadrant pain: Secondary | ICD-10-CM | POA: Diagnosis not present

## 2018-09-21 MED ORDER — HYDROCODONE-HOMATROPINE 5-1.5 MG/5ML PO SYRP: 5.0000 mL | ORAL_SOLUTION | ORAL | 0 refills | Status: DC | PRN

## 2018-09-21 MED ORDER — AZITHROMYCIN 250 MG PO TABS: ORAL_TABLET | ORAL | 0 refills | Status: DC

## 2018-09-21 NOTE — Telephone Encounter (Addendum)
Pharmacy calling with a possible drug interaction between the  azithromycin (ZITHROMAX Z-PAK) 250 MG tablet The dr just prescribed and the  warfarin (COUMADIN) 7.5 MG tablet  Please call back and advise if ok   Kristopher Oppenheim at [place], [place] - [place] [phone] (Phone) [phone] (Fax)

## 2018-09-21 NOTE — Progress Notes (Signed)
Virtual Visit via Video Note  I connected with [name] on 09/21/18 at  2:00 PM EDT by a video enabled telemedicine application and verified that I am speaking with the correct person using two identifiers.   I discussed the limitations of evaluation and management by telemedicine and the availability of in person appointments. The patient expressed understanding and agreed to proceed.  History of Present Illness: We need to follow-up on GS C/o cough x 2-3 wks There has been no runny nose,chest pain, shortness of breath, abdominal pain, diarrhea, constipation, arthralgias, skin rashes.   Observations/Objective: The patient appears to be in no acute distress, looks well.  Assessment and Plan:  See my Assessment and Plan. Follow Up Instructions:    I discussed the assessment and treatment plan with the patient. The patient was provided an opportunity to ask questions and all were answered. The patient agreed with the plan and demonstrated an understanding of the instructions.   The patient was advised to call back or seek an in-person evaluation if the symptoms worsen or if the condition fails to improve as anticipated.  I provided face-to-face time during this encounter. We were at different locations.   Walker Kehr, MD

## 2018-09-22 NOTE — Telephone Encounter (Signed)
I'm aware Thx

## 2018-09-22 NOTE — Telephone Encounter (Signed)
Left detailed message informing pharmacy of below.

## 2018-09-23 ENCOUNTER — Other Ambulatory Visit: Payer: Self-pay | Admitting: Internal Medicine

## 2018-09-23 DIAGNOSIS — K8020 Calculus of gallbladder without cholecystitis without obstruction: Secondary | ICD-10-CM

## 2018-09-23 DIAGNOSIS — R1011 Right upper quadrant pain: Secondary | ICD-10-CM

## 2018-09-26 ENCOUNTER — Encounter: Payer: Self-pay | Admitting: Internal Medicine

## 2018-09-26 DIAGNOSIS — K8020 Calculus of gallbladder without cholecystitis without obstruction: Secondary | ICD-10-CM | POA: Insufficient documentation

## 2018-09-26 NOTE — Assessment & Plan Note (Signed)
Likely viral.  Hycodan cough syrup provided.  The patient declined COVID-19 test (he said he had similar symptoms last year)

## 2018-09-26 NOTE — Assessment & Plan Note (Signed)
No abdominal pain at present.  Will obtain a surgical consultation regarding gallstones

## 2018-09-26 NOTE — Assessment & Plan Note (Signed)
Obtain surgical consultation

## 2018-09-28 ENCOUNTER — Ambulatory Visit (INDEPENDENT_AMBULATORY_CARE_PROVIDER_SITE_OTHER): Payer: Medicare Other | Admitting: *Deleted

## 2018-09-28 ENCOUNTER — Other Ambulatory Visit: Payer: Self-pay

## 2018-09-28 DIAGNOSIS — I712 Thoracic aortic aneurysm, without rupture, unspecified: Secondary | ICD-10-CM

## 2018-09-28 DIAGNOSIS — Z5181 Encounter for therapeutic drug level monitoring: Secondary | ICD-10-CM | POA: Diagnosis not present

## 2018-09-28 DIAGNOSIS — I359 Nonrheumatic aortic valve disorder, unspecified: Secondary | ICD-10-CM

## 2018-09-28 LAB — POCT INR: INR: 2.7 (ref 2.0–3.0)

## 2018-09-28 NOTE — Patient Instructions (Signed)
Description   Continue on same dosage 1 tablet daily.  Recheck INR in 6 weeks. Call with any questions or new medications #[phone]

## 2018-10-04 ENCOUNTER — Other Ambulatory Visit: Payer: Self-pay

## 2018-10-04 ENCOUNTER — Inpatient Hospital Stay (HOSPITAL_BASED_OUTPATIENT_CLINIC_OR_DEPARTMENT_OTHER): Payer: Medicare Other | Admitting: Hematology & Oncology

## 2018-10-04 ENCOUNTER — Encounter: Payer: Self-pay | Admitting: Hematology & Oncology

## 2018-10-04 ENCOUNTER — Inpatient Hospital Stay: Payer: Medicare Other | Attending: Hematology & Oncology

## 2018-10-04 VITALS — BP 143/75 | HR 54 | Temp 97.5°F | Resp 18 | Wt 170.5 lb

## 2018-10-04 DIAGNOSIS — Z952 Presence of prosthetic heart valve: Secondary | ICD-10-CM | POA: Diagnosis not present

## 2018-10-04 DIAGNOSIS — D72819 Decreased white blood cell count, unspecified: Secondary | ICD-10-CM | POA: Diagnosis not present

## 2018-10-04 DIAGNOSIS — D696 Thrombocytopenia, unspecified: Secondary | ICD-10-CM | POA: Insufficient documentation

## 2018-10-04 DIAGNOSIS — Z7901 Long term (current) use of anticoagulants: Secondary | ICD-10-CM | POA: Diagnosis not present

## 2018-10-04 DIAGNOSIS — Z87891 Personal history of nicotine dependence: Secondary | ICD-10-CM | POA: Insufficient documentation

## 2018-10-04 DIAGNOSIS — D485 Neoplasm of uncertain behavior of skin: Secondary | ICD-10-CM

## 2018-10-04 DIAGNOSIS — I351 Nonrheumatic aortic (valve) insufficiency: Secondary | ICD-10-CM | POA: Diagnosis not present

## 2018-10-04 DIAGNOSIS — K8689 Other specified diseases of pancreas: Secondary | ICD-10-CM

## 2018-10-04 DIAGNOSIS — K869 Disease of pancreas, unspecified: Secondary | ICD-10-CM | POA: Insufficient documentation

## 2018-10-04 LAB — CMP (CANCER CENTER ONLY)
ALT: 27 U/L (ref 0–44)
AST: 25 U/L (ref 15–41)
Albumin: 4.1 g/dL (ref 3.5–5.0)
Alkaline Phosphatase: 74 U/L (ref 38–126)
Anion gap: 6 (ref 5–15)
BUN: 16 mg/dL (ref 8–23)
CO2: 31 mmol/L (ref 22–32)
Calcium: 9.3 mg/dL (ref 8.9–10.3)
Chloride: 105 mmol/L (ref 98–111)
Creatinine: 1.22 mg/dL (ref 0.61–1.24)
GFR, Est AFR Am: >60 mL/min (ref >60)
GFR, Estimated: 57 mL/min — ABNORMAL LOW (ref >60)
Glucose, Bld: 117 mg/dL — ABNORMAL HIGH (ref 70–99)
Potassium: 4.5 mmol/L (ref 3.5–5.1)
Sodium: 142 mmol/L (ref 135–145)
Total Bilirubin: 0.8 mg/dL (ref 0.3–1.2)
Total Protein: 6.1 g/dL — ABNORMAL LOW (ref 6.5–8.1)

## 2018-10-04 LAB — RETIC PANEL
Immature Retic Fract: 11.8 % (ref 2.3–15.9)
RBC.: 4.11 MIL/uL — ABNORMAL LOW (ref 4.22–5.81)
Retic Count, Absolute: 73.2 10*3/uL (ref 19.0–186.0)
Retic Ct Pct: 1.8 % (ref 0.4–3.1)
Reticulocyte Hemoglobin: 38.7 pg (ref >27.9)

## 2018-10-04 LAB — CBC WITH DIFFERENTIAL (CANCER CENTER ONLY)
Abs Immature Granulocytes: 0.04 10*3/uL (ref 0.00–0.07)
Basophils Absolute: 0 10*3/uL (ref 0.0–0.1)
Basophils Relative: 0 %
Eosinophils Absolute: 0.1 10*3/uL (ref 0.0–0.5)
Eosinophils Relative: 1 %
HCT: 41.2 % (ref 39.0–52.0)
Hemoglobin: 14 g/dL (ref 13.0–17.0)
Immature Granulocytes: 1 %
Lymphocytes Relative: 13 %
Lymphs Abs: 0.5 10*3/uL — ABNORMAL LOW (ref 0.7–4.0)
MCH: 33.9 pg (ref 26.0–34.0)
MCHC: 34 g/dL (ref 30.0–36.0)
MCV: 99.8 fL (ref 80.0–100.0)
Monocytes Absolute: 0.3 10*3/uL (ref 0.1–1.0)
Monocytes Relative: 7 %
Neutro Abs: 2.7 10*3/uL (ref 1.7–7.7)
Neutrophils Relative %: 78 %
Platelet Count: 92 10*3/uL — ABNORMAL LOW (ref 150–400)
RBC: 4.13 MIL/uL — ABNORMAL LOW (ref 4.22–5.81)
RDW: 13.3 % (ref 11.5–15.5)
WBC Count: 3.5 10*3/uL — ABNORMAL LOW (ref 4.0–10.5)
nRBC: 0 % (ref 0.0–0.2)

## 2018-10-04 LAB — SAVE SMEAR(SSMR), FOR PROVIDER SLIDE REVIEW

## 2018-10-04 LAB — VITAMIN B12: Vitamin B-12: 534 pg/mL (ref 180–914)
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
## 2018-10-05 LAB — PROTEIN ELECTROPHORESIS, SERUM, WITH REFLEX
A/G Ratio: 1.6 (ref 0.7–1.7)
Albumin ELP: 3.7 g/dL (ref 2.9–4.4)
Alpha-1-Globulin: 0.3 g/dL (ref 0.0–0.4)
Alpha-2-Globulin: 0.4 g/dL (ref 0.4–1.0)
Beta Globulin: 0.9 g/dL (ref 0.7–1.3)
Gamma Globulin: 0.7 g/dL (ref 0.4–1.8)
Globulin, Total: 2.3 g/dL (ref 2.2–3.9)
Total Protein ELP: 6 g/dL (ref 6.0–8.5)

## 2018-10-06 LAB — ERYTHROPOIETIN: Erythropoietin: 47.3 m[IU]/mL — ABNORMAL HIGH (ref 2.6–18.5)

## 2018-10-07 ENCOUNTER — Ambulatory Visit (HOSPITAL_BASED_OUTPATIENT_CLINIC_OR_DEPARTMENT_OTHER)
Admission: RE | Admit: 2018-10-07 | Discharge: 2018-10-07 | Disposition: A | Discharge status: Home / Self Care | Payer: Medicare Other | Source: Ambulatory Visit | Attending: Hematology & Oncology | Admitting: Hematology & Oncology

## 2018-10-07 ENCOUNTER — Encounter (HOSPITAL_BASED_OUTPATIENT_CLINIC_OR_DEPARTMENT_OTHER): Payer: Self-pay

## 2018-10-07 ENCOUNTER — Other Ambulatory Visit: Payer: Self-pay

## 2018-10-07 DIAGNOSIS — I517 Cardiomegaly: Secondary | ICD-10-CM | POA: Diagnosis not present

## 2018-10-07 DIAGNOSIS — K7689 Other specified diseases of liver: Secondary | ICD-10-CM | POA: Diagnosis not present

## 2018-10-07 DIAGNOSIS — N281 Cyst of kidney, acquired: Secondary | ICD-10-CM | POA: Diagnosis not present

## 2018-10-07 DIAGNOSIS — K8689 Other specified diseases of pancreas: Secondary | ICD-10-CM | POA: Insufficient documentation

## 2018-10-07 DIAGNOSIS — I700 Atherosclerosis of aorta: Secondary | ICD-10-CM | POA: Diagnosis not present

## 2018-10-07 DIAGNOSIS — K5730 Diverticulosis of large intestine without perforation or abscess without bleeding: Secondary | ICD-10-CM | POA: Diagnosis not present

## 2018-10-07 MED ORDER — IOHEXOL 300 MG/ML  SOLN
100.0000 mL | Freq: Once | INTRAMUSCULAR | Status: AC | PRN
  Administered 2018-10-07: 100 mL via INTRAVENOUS

## 2018-10-11 ENCOUNTER — Encounter: Payer: Self-pay | Admitting: Hematology & Oncology

## 2018-10-13 ENCOUNTER — Other Ambulatory Visit: Payer: Self-pay | Admitting: Internal Medicine

## 2018-10-13 DIAGNOSIS — I77819 Aortic ectasia, unspecified site: Secondary | ICD-10-CM

## 2018-10-13 DIAGNOSIS — Z01812 Encounter for preprocedural laboratory examination: Secondary | ICD-10-CM

## 2018-10-13 MED ORDER — AZITHROMYCIN 250 MG PO TABS: ORAL_TABLET | ORAL | 0 refills | Status: DC

## 2018-10-13 MED ORDER — HYDROXYCHLOROQUINE SULFATE 200 MG PO TABS: ORAL_TABLET | ORAL | 0 refills | Status: DC

## 2018-10-14 ENCOUNTER — Other Ambulatory Visit: Payer: Self-pay | Admitting: Internal Medicine

## 2018-10-14 MED ORDER — HYDROCODONE-HOMATROPINE 5-1.5 MG/5ML PO SYRP: 5.0000 mL | ORAL_SOLUTION | ORAL | 0 refills | Status: DC | PRN

## 2018-10-26 DIAGNOSIS — R6889 Other general symptoms and signs: Secondary | ICD-10-CM | POA: Diagnosis not present

## 2018-10-27 ENCOUNTER — Other Ambulatory Visit: Payer: Self-pay | Admitting: Emergency Medicine

## 2018-10-27 DIAGNOSIS — Z20822 Contact with and (suspected) exposure to covid-19: Secondary | ICD-10-CM

## 2018-10-27 DIAGNOSIS — R6889 Other general symptoms and signs: Secondary | ICD-10-CM | POA: Diagnosis not present

## 2018-10-28 LAB — NOVEL CORONAVIRUS, NAA: SARS-CoV-2, NAA: NOT DETECTED

## 2018-10-29 ENCOUNTER — Other Ambulatory Visit: Payer: Self-pay | Admitting: Internal Medicine

## 2018-11-09 ENCOUNTER — Other Ambulatory Visit: Payer: Medicare Other

## 2018-11-18 ENCOUNTER — Other Ambulatory Visit: Payer: Medicare Other | Admitting: *Deleted

## 2018-11-18 ENCOUNTER — Ambulatory Visit (INDEPENDENT_AMBULATORY_CARE_PROVIDER_SITE_OTHER): Payer: Medicare Other | Admitting: *Deleted

## 2018-11-18 ENCOUNTER — Other Ambulatory Visit: Payer: Self-pay

## 2018-11-18 DIAGNOSIS — I712 Thoracic aortic aneurysm, without rupture, unspecified: Secondary | ICD-10-CM

## 2018-11-18 DIAGNOSIS — Z5181 Encounter for therapeutic drug level monitoring: Secondary | ICD-10-CM | POA: Diagnosis not present

## 2018-11-18 DIAGNOSIS — I77819 Aortic ectasia, unspecified site: Secondary | ICD-10-CM

## 2018-11-18 DIAGNOSIS — Z01812 Encounter for preprocedural laboratory examination: Secondary | ICD-10-CM | POA: Diagnosis not present

## 2018-11-18 DIAGNOSIS — I359 Nonrheumatic aortic valve disorder, unspecified: Secondary | ICD-10-CM | POA: Diagnosis not present

## 2018-11-18 LAB — BASIC METABOLIC PANEL
BUN/Creatinine Ratio: 10 (ref 10–24)
BUN: 13 mg/dL (ref 8–27)
CO2: 27 mmol/L (ref 20–29)
Calcium: 9.4 mg/dL (ref 8.6–10.2)
Chloride: 103 mmol/L (ref 96–106)
Creatinine, Ser: 1.25 mg/dL (ref 0.76–1.27)
GFR calc Af Amer: 64 mL/min/{1.73_m2} (ref >59)
GFR calc non Af Amer: 56 mL/min/{1.73_m2} — ABNORMAL LOW (ref >59)
Glucose: 105 mg/dL — ABNORMAL HIGH (ref 65–99)
Potassium: 4.5 mmol/L (ref 3.5–5.2)
Sodium: 140 mmol/L (ref 134–144)

## 2018-11-18 LAB — POCT INR: INR: 3.7 — AB (ref 2.0–3.0)

## 2018-11-18 NOTE — Patient Instructions (Signed)
Description   Hold today, then continue on same dosage 1 tablet daily.  Recheck INR in 3 weeks. Call with any questions or new medications #[phone]

## 2018-11-19 ENCOUNTER — Other Ambulatory Visit: Payer: Medicare Other

## 2018-11-19 ENCOUNTER — Ambulatory Visit (INDEPENDENT_AMBULATORY_CARE_PROVIDER_SITE_OTHER)
Admission: RE | Admit: 2018-11-19 | Discharge: 2018-11-19 | Disposition: A | Discharge status: Home / Self Care | Payer: Medicare Other | Source: Ambulatory Visit | Attending: Cardiovascular Disease | Admitting: Cardiovascular Disease

## 2018-11-19 DIAGNOSIS — I77810 Thoracic aortic ectasia: Secondary | ICD-10-CM | POA: Diagnosis not present

## 2018-11-19 DIAGNOSIS — I77819 Aortic ectasia, unspecified site: Secondary | ICD-10-CM

## 2018-11-19 MED ORDER — IOHEXOL 350 MG/ML SOLN
100.0000 mL | Freq: Once | INTRAVENOUS | Status: AC | PRN
  Administered 2018-11-19: 100 mL via INTRAVENOUS

## 2018-11-22 ENCOUNTER — Telehealth: Payer: Self-pay | Admitting: *Deleted

## 2018-11-22 DIAGNOSIS — E042 Nontoxic multinodular goiter: Secondary | ICD-10-CM

## 2018-11-22 DIAGNOSIS — I712 Thoracic aortic aneurysm, without rupture, unspecified: Secondary | ICD-10-CM

## 2018-11-22 NOTE — Telephone Encounter (Signed)
I spoke with pt and reviewed results with him.  He would like to proceed with thyroid ultrasound.  Will order to be done at [place] at [facility].

## 2018-11-22 NOTE — Telephone Encounter (Signed)
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 We will follow this with yearly CT scans. He also has thyroid nodules. Can we let him know that he should have a dedicated thyroid ultrasound to better evaluate this. We can arrange this for him if he would like. Thanks, Gerald Stabs

## 2018-11-24 ENCOUNTER — Other Ambulatory Visit: Payer: Self-pay | Admitting: Cardiovascular Disease

## 2018-11-26 ENCOUNTER — Ambulatory Visit (INDEPENDENT_AMBULATORY_CARE_PROVIDER_SITE_OTHER)
Admission: RE | Admit: 2018-11-26 | Discharge: 2018-11-26 | Disposition: A | Discharge status: Home / Self Care | Payer: Medicare Other | Source: Ambulatory Visit | Attending: Family Medicine | Admitting: Family Medicine

## 2018-11-26 ENCOUNTER — Ambulatory Visit: Payer: Self-pay

## 2018-11-26 ENCOUNTER — Other Ambulatory Visit: Payer: Self-pay

## 2018-11-26 ENCOUNTER — Ambulatory Visit (INDEPENDENT_AMBULATORY_CARE_PROVIDER_SITE_OTHER): Payer: Medicare Other | Admitting: Family Medicine

## 2018-11-26 VITALS — BP 138/78 | HR 79 | Ht 72.0 in | Wt 172.0 lb

## 2018-11-26 DIAGNOSIS — M79672 Pain in left foot: Secondary | ICD-10-CM

## 2018-11-26 DIAGNOSIS — Z23 Encounter for immunization: Secondary | ICD-10-CM

## 2018-11-26 DIAGNOSIS — M25472 Effusion, left ankle: Secondary | ICD-10-CM

## 2018-11-26 DIAGNOSIS — M19072 Primary osteoarthritis, left ankle and foot: Secondary | ICD-10-CM | POA: Diagnosis not present

## 2018-11-26 NOTE — Assessment & Plan Note (Signed)
Left ankle effusion.  Discussed Tylenol for pain relief.  X-rays are pending.  Patient has a large effusion of the ankle joint and likely does have a intra-articular fracture.  Discussed with patient icing regimen, home exercise, which activities to avoid.  Worsening pain will consider the possibility of mild pain medication but I think I would avoid it overall.

## 2018-11-26 NOTE — Patient Instructions (Signed)
4000Vit D Wear boot until we see you in 3 weeks Xray downstairs
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
## 2018-11-26 NOTE — Assessment & Plan Note (Addendum)
Arthritis of the left foot.  Discussed posture and ergonomics, discussed cam walker.  Concern for potential injury also to the Lisfranc.  Difficult to tell secondary to the underlying arthritis.  Due to the amount of effusion concern for potential OCD.  X-rays pending but if continues to have problems with the ankle will need to consider the possibility of referring her to the gym.  Discussed topical anti-inflammatories.  Follow-up again in 4 to 8 weeks

## 2018-11-27 ENCOUNTER — Other Ambulatory Visit: Payer: Self-pay | Admitting: Internal Medicine

## 2018-11-27 ENCOUNTER — Encounter: Payer: Self-pay | Admitting: Family Medicine

## 2018-11-27 DIAGNOSIS — R634 Abnormal weight loss: Secondary | ICD-10-CM

## 2018-11-29 ENCOUNTER — Other Ambulatory Visit: Payer: Self-pay | Admitting: Cardiovascular Disease

## 2018-11-29 DIAGNOSIS — E042 Nontoxic multinodular goiter: Secondary | ICD-10-CM

## 2018-11-30 ENCOUNTER — Other Ambulatory Visit: Payer: Self-pay | Admitting: Cardiovascular Disease

## 2018-11-30 ENCOUNTER — Ambulatory Visit
Admission: RE | Admit: 2018-11-30 | Discharge: 2018-11-30 | Disposition: A | Discharge status: Home / Self Care | Payer: Medicare Other | Source: Ambulatory Visit | Attending: Cardiovascular Disease | Admitting: Cardiovascular Disease

## 2018-11-30 ENCOUNTER — Other Ambulatory Visit (INDEPENDENT_AMBULATORY_CARE_PROVIDER_SITE_OTHER): Payer: Medicare Other

## 2018-11-30 DIAGNOSIS — R634 Abnormal weight loss: Secondary | ICD-10-CM

## 2018-11-30 DIAGNOSIS — E042 Nontoxic multinodular goiter: Secondary | ICD-10-CM | POA: Diagnosis not present

## 2018-11-30 LAB — URINALYSIS
Bilirubin Urine: NEGATIVE
Hgb urine dipstick: NEGATIVE
Ketones, ur: NEGATIVE
Leukocytes,Ua: NEGATIVE
Nitrite: NEGATIVE
Specific Gravity, Urine: 1.02 (ref 1.000–1.030)
Total Protein, Urine: NEGATIVE
Urine Glucose: NEGATIVE
Urobilinogen, UA: 0.2 (ref 0.0–1.0)
pH: 6 (ref 5.0–8.0)

## 2018-11-30 LAB — CBC WITH DIFFERENTIAL/PLATELET
Basophils Absolute: 0 10*3/uL (ref 0.0–0.1)
Basophils Relative: 0.3 % (ref 0.0–3.0)
Eosinophils Absolute: 0 10*3/uL (ref 0.0–0.7)
Eosinophils Relative: 1.5 % (ref 0.0–5.0)
HCT: 42.8 % (ref 39.0–52.0)
Hemoglobin: 14.5 g/dL (ref 13.0–17.0)
Lymphocytes Relative: 22.8 % (ref 12.0–46.0)
Lymphs Abs: 0.6 10*3/uL — ABNORMAL LOW (ref 0.7–4.0)
MCHC: 33.9 g/dL (ref 30.0–36.0)
MCV: 102 fl — ABNORMAL HIGH (ref 78.0–100.0)
Monocytes Absolute: 0.3 10*3/uL (ref 0.1–1.0)
Monocytes Relative: 9.7 % (ref 3.0–12.0)
Neutro Abs: 1.7 10*3/uL (ref 1.4–7.7)
Neutrophils Relative %: 65.7 % (ref 43.0–77.0)
Platelets: 84 10*3/uL — ABNORMAL LOW (ref 150.0–400.0)
RBC: 4.19 Mil/uL — ABNORMAL LOW (ref 4.22–5.81)
RDW: 14.8 % (ref 11.5–15.5)
WBC: 2.6 10*3/uL — ABNORMAL LOW (ref 4.0–10.5)

## 2018-11-30 LAB — HEPATIC FUNCTION PANEL
ALT: 17 U/L (ref 0–53)
AST: 18 U/L (ref 0–37)
Albumin: 4.4 g/dL (ref 3.5–5.2)
Alkaline Phosphatase: 75 U/L (ref 39–117)
Bilirubin, Direct: 0.1 mg/dL (ref 0.0–0.3)
Total Bilirubin: 0.6 mg/dL (ref 0.2–1.2)
Total Protein: 6.7 g/dL (ref 6.0–8.3)

## 2018-11-30 LAB — T3, FREE: T3, Free: 3.4 pg/mL (ref 2.3–4.2)

## 2018-11-30 LAB — TSH: TSH: 0.56 u[IU]/mL (ref 0.35–4.50)

## 2018-11-30 LAB — T4, FREE: Free T4: 1 ng/dL (ref 0.60–1.60)

## 2018-11-30 NOTE — Telephone Encounter (Signed)
Warfarin refill received and we sent it over on 11/25/2018. Called the pharmacy and spoke with Verdis Frederickson and she confirmed she has it. Confirmed 90 tablets for a 90 day supply.

## 2018-12-02 ENCOUNTER — Telehealth: Payer: Self-pay | Admitting: *Deleted

## 2018-12-02 MED ORDER — TADALAFIL 5 MG PO TABS: 5.0000 mg | ORAL_TABLET | ORAL | 3 refills | Status: DC

## 2018-12-02 NOTE — Telephone Encounter (Signed)
OK to refill. He can take it every other day instead of every day  Thanks Nickolas Madrid, MD 12/02/2018

## 2018-12-02 NOTE — Telephone Encounter (Signed)
  Cialis 5mg  for BPH -refilled    OK to refill. He can take it every other day instead of every day

## 2018-12-06 ENCOUNTER — Other Ambulatory Visit: Payer: Self-pay

## 2018-12-06 ENCOUNTER — Ambulatory Visit (INDEPENDENT_AMBULATORY_CARE_PROVIDER_SITE_OTHER): Payer: Medicare Other | Admitting: Pharmacist

## 2018-12-06 DIAGNOSIS — I712 Thoracic aortic aneurysm, without rupture, unspecified: Secondary | ICD-10-CM

## 2018-12-06 DIAGNOSIS — I359 Nonrheumatic aortic valve disorder, unspecified: Secondary | ICD-10-CM

## 2018-12-06 DIAGNOSIS — Z5181 Encounter for therapeutic drug level monitoring: Secondary | ICD-10-CM | POA: Diagnosis not present

## 2018-12-06 LAB — POCT INR: INR: 1.8 — AB (ref 2.0–3.0)

## 2018-12-06 NOTE — Patient Instructions (Signed)
Description   Take 1.5 tablets today, then continue on same dosage 1 tablet daily.  Recheck INR in 2 weeks. Call with any questions or new medications #[phone]

## 2018-12-11 ENCOUNTER — Other Ambulatory Visit: Payer: Self-pay | Admitting: Internal Medicine

## 2018-12-11 DIAGNOSIS — E041 Nontoxic single thyroid nodule: Secondary | ICD-10-CM

## 2018-12-13 NOTE — Addendum Note (Signed)
Addended by: Karren Cobble on: 12/13/2018 09:36 AM   Modules accepted: Orders

## 2018-12-14 ENCOUNTER — Other Ambulatory Visit: Payer: Self-pay | Admitting: Cardiology

## 2018-12-16 ENCOUNTER — Other Ambulatory Visit: Payer: Self-pay

## 2018-12-16 ENCOUNTER — Other Ambulatory Visit: Payer: Self-pay | Admitting: Cardiovascular Disease

## 2018-12-16 DIAGNOSIS — Z20822 Contact with and (suspected) exposure to covid-19: Secondary | ICD-10-CM

## 2018-12-17 ENCOUNTER — Telehealth: Payer: Self-pay | Admitting: Pharmacist

## 2018-12-17 ENCOUNTER — Telehealth: Payer: Self-pay | Admitting: *Deleted

## 2018-12-17 NOTE — Telephone Encounter (Signed)
   Primary Cardiologist: Lauree Chandler, MD  Chart reviewed as part of pre-operative protocol coverage. Given past medical history and time since last visit, based on ACC/AHA guidelines, Patrick [name] would be at acceptable risk for the planned procedure without further cardiovascular testing.   Per pharmacy, patient with diagnosis of mechanical AVR on warfarin for anticoagulation.    Per office protocol, patient can hold warfarin for 5 days prior to procedure.    Patient will NOT need bridging with Lovenox (enoxaparin) around procedure.  I will route this recommendation to the requesting party via Epic fax function and remove from pre-op pool.  Please call with questions.  Kathyrn Drown, NP 12/17/2018, 11:11 AM

## 2018-12-17 NOTE — Telephone Encounter (Signed)
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    Procedure:  THYROID BX  Date of procedure: TBD  Per office protocol, patient can hold warfarin for 5 days prior to procedure.    Patient will NOT need bridging with Lovenox (enoxaparin) around procedure.

## 2018-12-17 NOTE — Telephone Encounter (Signed)
Patient called to cancel coumadin appointment. States he has COVID. Patient will call back at another time to reschedule

## 2018-12-17 NOTE — Telephone Encounter (Signed)
   [facility] Pre-operative Risk Assessment    Request for surgical clearance:  1. What type of surgery is being performed? THYROID BX   2. When is this surgery scheduled? TBD   3. What type of clearance is required (medical clearance vs. Pharmacy clearance to hold med vs. Both)? PHARMACY  4. Are there any medications that need to be held prior to surgery and how long? COUMADIN   5. Practice name and name of physician performing surgery? [facility]; DOCTOR NOT LISTED AS THE RADIOLOGIST CHANGE EVERYDAY FOR PROCEDURES   6. What is your office phone number [phone]    7.   What is your office fax number [phone]  8.   Anesthesia type (None, local, MAC, general) ? NOT LISTED; LOCAL ?    Patrick [name] 12/17/2018, 10:24 AM  _________________________________________________________________   (provider comments below)

## 2018-12-18 LAB — NOVEL CORONAVIRUS, NAA: SARS-CoV-2, NAA: NOT DETECTED

## 2018-12-20 ENCOUNTER — Other Ambulatory Visit: Payer: Self-pay | Admitting: Internal Medicine

## 2018-12-20 DIAGNOSIS — Z20828 Contact with and (suspected) exposure to other viral communicable diseases: Secondary | ICD-10-CM

## 2018-12-20 DIAGNOSIS — Z20822 Contact with and (suspected) exposure to covid-19: Secondary | ICD-10-CM

## 2018-12-20 NOTE — Progress Notes (Signed)
Needs covid test

## 2018-12-21 ENCOUNTER — Ambulatory Visit: Payer: Medicare Other | Admitting: Family Medicine

## 2018-12-21 ENCOUNTER — Telehealth: Payer: Self-pay | Admitting: Cardiovascular Disease

## 2018-12-21 NOTE — Telephone Encounter (Signed)
New message:    Patient calling because he has been tested twice for covid he dose not have it, but his wife has it. Patient has appt coming on Monday and he is not sure what he should do. Please call patient back.

## 2018-12-21 NOTE — Telephone Encounter (Signed)
New Message     Pt is calling because his wife has COVID and has had tested positive 10 days ago. He says he has tested NEG twice and is wondering if he can come into the office still     Please call

## 2018-12-22 NOTE — Telephone Encounter (Signed)
Spoke with patient after reviewing with Dr. Angelena Form.  Pt has been test and is planning to test again Thursday or Friday this week.  Adv him that he should keep his cardiology appointment Monday unless he gets any symptoms at all then he will cancel.

## 2018-12-22 NOTE — Telephone Encounter (Signed)
See other telephone encounter from today for more details.

## 2018-12-24 ENCOUNTER — Other Ambulatory Visit: Payer: Self-pay

## 2018-12-24 DIAGNOSIS — Z20828 Contact with and (suspected) exposure to other viral communicable diseases: Secondary | ICD-10-CM | POA: Diagnosis not present

## 2018-12-24 DIAGNOSIS — Z20822 Contact with and (suspected) exposure to covid-19: Secondary | ICD-10-CM

## 2018-12-26 LAB — NOVEL CORONAVIRUS, NAA: SARS-CoV-2, NAA: NOT DETECTED

## 2018-12-27 ENCOUNTER — Ambulatory Visit (INDEPENDENT_AMBULATORY_CARE_PROVIDER_SITE_OTHER): Payer: Medicare Other | Admitting: Cardiovascular Disease

## 2018-12-27 ENCOUNTER — Other Ambulatory Visit: Payer: Self-pay

## 2018-12-27 ENCOUNTER — Encounter: Payer: Self-pay | Admitting: Cardiovascular Disease

## 2018-12-27 VITALS — BP 142/82 | HR 54 | Ht 72.0 in | Wt 174.4 lb

## 2018-12-27 DIAGNOSIS — I714 Abdominal aortic aneurysm, without rupture, unspecified: Secondary | ICD-10-CM

## 2018-12-27 DIAGNOSIS — I10 Essential (primary) hypertension: Secondary | ICD-10-CM

## 2018-12-27 DIAGNOSIS — I712 Thoracic aortic aneurysm, without rupture, unspecified: Secondary | ICD-10-CM

## 2018-12-27 DIAGNOSIS — I359 Nonrheumatic aortic valve disorder, unspecified: Secondary | ICD-10-CM | POA: Diagnosis not present

## 2018-12-27 MED ORDER — AMLODIPINE BESYLATE 5 MG PO TABS: 5.0000 mg | ORAL_TABLET | Freq: Every day | ORAL | 3 refills | Status: DC
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
## 2018-12-27 NOTE — Patient Instructions (Signed)
Medication Instructions:  No changes *If you need a refill on your cardiac medications before your next appointment, please call your pharmacy*  Lab Work: none If you have labs (blood work) drawn today and your tests are completely normal, you will receive your results only by: Marland Kitchen MyChart Message (if you have MyChart) OR . A paper copy in the mail If you have any lab test that is abnormal or we need to change your treatment, we will call you to review the results.  Testing/Procedures: Repeat chest ct in September 2021 to monitor thoracic aortic aneurysm.  Follow-Up: At [facility], you and your health needs are our priority.  As part of our continuing mission to provide you with exceptional heart care, we have created designated Provider Care Teams.  These Care Teams include your primary Cardiologist (physician) and Advanced Practice Providers (APPs -  Physician Assistants and Nurse Practitioners) who all work together to provide you with the care you need, when you need it.  Your next appointment:   12 months  The format for your next appointment:   In Person  Provider:   Lauree Chandler, MD  Other Instructions

## 2018-12-28 ENCOUNTER — Ambulatory Visit (INDEPENDENT_AMBULATORY_CARE_PROVIDER_SITE_OTHER): Payer: Medicare Other | Admitting: Pharmacist

## 2018-12-28 ENCOUNTER — Ambulatory Visit: Payer: Self-pay

## 2018-12-28 ENCOUNTER — Encounter: Payer: Self-pay | Admitting: Family Medicine

## 2018-12-28 ENCOUNTER — Ambulatory Visit (INDEPENDENT_AMBULATORY_CARE_PROVIDER_SITE_OTHER): Payer: Medicare Other | Admitting: Family Medicine

## 2018-12-28 VITALS — BP 120/70 | HR 58 | Ht 72.0 in | Wt 174.0 lb

## 2018-12-28 DIAGNOSIS — L820 Inflamed seborrheic keratosis: Secondary | ICD-10-CM | POA: Diagnosis not present

## 2018-12-28 DIAGNOSIS — G8929 Other chronic pain: Secondary | ICD-10-CM | POA: Diagnosis not present

## 2018-12-28 DIAGNOSIS — I359 Nonrheumatic aortic valve disorder, unspecified: Secondary | ICD-10-CM | POA: Diagnosis not present

## 2018-12-28 DIAGNOSIS — Z85828 Personal history of other malignant neoplasm of skin: Secondary | ICD-10-CM | POA: Diagnosis not present

## 2018-12-28 DIAGNOSIS — D225 Melanocytic nevi of trunk: Secondary | ICD-10-CM | POA: Diagnosis not present

## 2018-12-28 DIAGNOSIS — M19072 Primary osteoarthritis, left ankle and foot: Secondary | ICD-10-CM

## 2018-12-28 DIAGNOSIS — I712 Thoracic aortic aneurysm, without rupture, unspecified: Secondary | ICD-10-CM

## 2018-12-28 DIAGNOSIS — M25572 Pain in left ankle and joints of left foot: Secondary | ICD-10-CM

## 2018-12-28 DIAGNOSIS — L570 Actinic keratosis: Secondary | ICD-10-CM | POA: Diagnosis not present

## 2018-12-28 DIAGNOSIS — Z5181 Encounter for therapeutic drug level monitoring: Secondary | ICD-10-CM

## 2018-12-28 DIAGNOSIS — L821 Other seborrheic keratosis: Secondary | ICD-10-CM | POA: Diagnosis not present

## 2018-12-28 LAB — POCT INR: INR: 3.2 — AB (ref 2.0–3.0)
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
## 2018-12-28 NOTE — Assessment & Plan Note (Signed)
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  Patient did have still an ankle effusion noted on ultrasound.  Patient declined any type of aspiration.  We discussed the possibility of an OCD and patient would not want any advanced imaging because it would not change medical management.  Patient will follow up with him again for day weeks for further evaluation and treatment.

## 2018-12-28 NOTE — Patient Instructions (Signed)
Hold coumadin today, then continue on same dosage 1 tablet daily.  Start holding for biopsy on 11/12. Recheck INR on 11/17 prior to biopsy and then again on 11/24 . Call with any questions or new medications #[phone]

## 2018-12-28 NOTE — Patient Instructions (Signed)
Heel lift in the toe of shoe Ok to start increasing activity Let me know if swelling increases If you get worse let me know and we can get MRI or do injection

## 2018-12-30 NOTE — Telephone Encounter (Signed)
No long term side effects to worry about. Lets just go back to the 5mg  daily since that worked better before, the max dose is 20mg , so room to increase if needed.  OKi, ok to refill his prescription, thanks  Nickolas Madrid, MD 12/30/2018

## 2019-01-03 ENCOUNTER — Inpatient Hospital Stay: Payer: Medicare Other | Attending: Hematology & Oncology | Admitting: Hematology & Oncology

## 2019-01-03 ENCOUNTER — Encounter: Payer: Self-pay | Admitting: Hematology & Oncology

## 2019-01-03 ENCOUNTER — Other Ambulatory Visit: Payer: Self-pay

## 2019-01-03 ENCOUNTER — Inpatient Hospital Stay: Payer: Medicare Other

## 2019-01-03 VITALS — BP 139/75 | HR 56 | Temp 97.1°F | Resp 18 | Wt 172.2 lb

## 2019-01-03 DIAGNOSIS — D708 Other neutropenia: Secondary | ICD-10-CM

## 2019-01-03 DIAGNOSIS — Z79899 Other long term (current) drug therapy: Secondary | ICD-10-CM | POA: Insufficient documentation

## 2019-01-03 DIAGNOSIS — D72819 Decreased white blood cell count, unspecified: Secondary | ICD-10-CM

## 2019-01-03 DIAGNOSIS — K8689 Other specified diseases of pancreas: Secondary | ICD-10-CM

## 2019-01-03 DIAGNOSIS — D696 Thrombocytopenia, unspecified: Secondary | ICD-10-CM | POA: Diagnosis not present

## 2019-01-03 DIAGNOSIS — Z7901 Long term (current) use of anticoagulants: Secondary | ICD-10-CM | POA: Diagnosis not present

## 2019-01-03 DIAGNOSIS — Z952 Presence of prosthetic heart valve: Secondary | ICD-10-CM | POA: Insufficient documentation

## 2019-01-03 HISTORY — DX: Thrombocytopenia, unspecified: D69.6

## 2019-01-03 HISTORY — DX: Decreased white blood cell count, unspecified: D72.819

## 2019-01-03 LAB — CBC WITH DIFFERENTIAL (CANCER CENTER ONLY)
Abs Immature Granulocytes: 0.01 10*3/uL (ref 0.00–0.07)
Basophils Absolute: 0 10*3/uL (ref 0.0–0.1)
Basophils Relative: 0 %
Eosinophils Absolute: 0 10*3/uL (ref 0.0–0.5)
Eosinophils Relative: 1 %
HCT: 42.4 % (ref 39.0–52.0)
Hemoglobin: 14.7 g/dL (ref 13.0–17.0)
Immature Granulocytes: 0 %
Lymphocytes Relative: 14 %
Lymphs Abs: 0.5 10*3/uL — ABNORMAL LOW (ref 0.7–4.0)
MCH: 34.7 pg — ABNORMAL HIGH (ref 26.0–34.0)
MCHC: 34.7 g/dL (ref 30.0–36.0)
MCV: 100 fL (ref 80.0–100.0)
Monocytes Absolute: 0.3 10*3/uL (ref 0.1–1.0)
Monocytes Relative: 8 %
Neutro Abs: 2.6 10*3/uL (ref 1.7–7.7)
Neutrophils Relative %: 77 %
Platelet Count: 79 10*3/uL — ABNORMAL LOW (ref 150–400)
RBC: 4.24 MIL/uL (ref 4.22–5.81)
RDW: 13.8 % (ref 11.5–15.5)
WBC Count: 3.3 10*3/uL — ABNORMAL LOW (ref 4.0–10.5)
nRBC: 0 % (ref 0.0–0.2)

## 2019-01-03 LAB — CMP (CANCER CENTER ONLY)
ALT: 27 U/L (ref 0–44)
AST: 22 U/L (ref 15–41)
Albumin: 4.7 g/dL (ref 3.5–5.0)
Alkaline Phosphatase: 65 U/L (ref 38–126)
Anion gap: 4 — ABNORMAL LOW (ref 5–15)
BUN: 16 mg/dL (ref 8–23)
CO2: 31 mmol/L (ref 22–32)
Calcium: 9.8 mg/dL (ref 8.9–10.3)
Chloride: 105 mmol/L (ref 98–111)
Creatinine: 1.24 mg/dL (ref 0.61–1.24)
GFR, Est AFR Am: >60 mL/min (ref >60)
GFR, Estimated: 56 mL/min — ABNORMAL LOW (ref >60)
Glucose, Bld: 95 mg/dL (ref 70–99)
Potassium: 4.4 mmol/L (ref 3.5–5.1)
Sodium: 140 mmol/L (ref 135–145)
Total Bilirubin: 0.7 mg/dL (ref 0.3–1.2)
Total Protein: 6.4 g/dL — ABNORMAL LOW (ref 6.5–8.1)

## 2019-01-03 LAB — PLATELET BY CITRATE

## 2019-01-03 LAB — SAVE SMEAR(SSMR), FOR PROVIDER SLIDE REVIEW

## 2019-01-03 NOTE — Progress Notes (Signed)
Hematology and Oncology Follow Up Visit  Patrick [name] [id number] November 22, 1940 78 y.o. 01/03/2019   Principle Diagnosis:   Thrombocytopenia and leukopenia  Current Therapy:    Observation     Interim History:  [name] is back for his second office visit.  We first saw him back in August.  At that time, I thought that he might have had an element of immune based thrombocytopenia.  He certainly could also have an element of myelodysplasia.  He was totally asymptomatic.  As such, I really do not think that we had to do anything invasive such as a bone marrow biopsy.  He is doing okay.  He is on Coumadin because he has the artificial heart valve.  He is going be traveling again.  Sounds like he is going back to [place].  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  He has a lot of fascinating stories that he tells.  He has had no issues with bleeding.  He has had no bruising.  There is been no fever.  He told me his wife had the coronavirus.  Thankfully, it sounds like he was spared this.  Overall, I think his performance status is ECOG 0.  Medications:  Current Outpatient Medications:  .  amLODipine (NORVASC) 5 MG tablet, Take 1 tablet (5 mg total) by mouth daily., Disp: 90 tablet, Rfl: 3 .  atorvastatin (LIPITOR) 10 MG tablet, TAKE ONE TABLET BY MOUTH DAILY, Disp: 90 tablet, Rfl: 3 .  desonide (DESOWEN) 0.05 % cream, Apply 1 application topically daily as needed., Disp: 60 g, Rfl: 3 .  pantoprazole (PROTONIX) 40 MG tablet, TAKE ONE TABLET BY MOUTH DAILY, Disp: 90 tablet, Rfl: 3 .  tadalafil (CIALIS) 5 MG tablet, Take 1 tablet (5 mg total) by mouth every other day., Disp: 15 tablet, Rfl: 3 .  triamcinolone cream (KENALOG) 0.1 %, Apply 1 application topically as needed. , Disp: , Rfl:  .  warfarin (COUMADIN) 7.5 MG tablet, TAKE ONE TABLET BY MOUTH DAILY, Disp: 90 tablet, Rfl: 0 .  amoxicillin (AMOXIL) 500 MG tablet, Take 4 tablets by mouth one hour prior to dental appointments  (Patient not taking: Reported on 01/03/2019), Disp: 20 tablet, Rfl: 1 .  diphenoxylate-atropine (LOMOTIL) 2.5-0.025 MG tablet, Take 1 tablet by mouth 4 (four) times daily as needed for diarrhea or loose stools. (Patient not taking: Reported on 01/03/2019), Disp: 60 tablet, Rfl: 1 .  SUMAtriptan (IMITREX) 50 MG tablet, take 1/2 tablet by mouth every 2 hours if needed as directed (Patient not taking: Reported on 01/03/2019), Disp: 9 tablet, Rfl: 3  Allergies:  Allergies  Allergen Reactions  . Avodart [Dutasteride]     gynecomastia  . Codeine Rash  . Terbinafine Itching    Past Medical History, Surgical history, Social history, and Family History were reviewed and updated.  Review of Systems: Review of Systems  Constitutional: Negative.   HENT:  Negative.   Eyes: Negative.   Respiratory: Negative.   Cardiovascular: Negative.   Gastrointestinal: Negative.   Endocrine: Negative.   Genitourinary: Negative.    Musculoskeletal: Negative.   Skin: Negative.   Neurological: Negative.   Hematological: Negative.   Psychiatric/Behavioral: Negative.     Physical Exam:  weight is 172 lb 4 oz (78.1 kg). His temporal temperature is 97.1 F (36.2 C) (abnormal). His blood pressure is 139/75 and his pulse is 56 (abnormal). His respiration is 18 and oxygen saturation is 100%.   Wt Readings from Last 3 Encounters:  01/03/19 172  lb 4 oz (78.1 kg)  12/28/18 174 lb (78.9 kg)  12/27/18 174 lb 6.4 oz (79.1 kg)    Physical Exam Vitals signs reviewed.  HENT:     Head: Normocephalic and atraumatic.  Eyes:     Pupils: Pupils are equal, round, and reactive to light.  Neck:     Musculoskeletal: Normal range of motion.  Cardiovascular:     Rate and Rhythm: Normal rate and regular rhythm.     Heart sounds: Normal heart sounds.     Comments: Cardiac exam shows a regular rate and rhythm.  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  I hear no murmurs or rubs or bruits. Pulmonary:      Effort: Pulmonary effort is normal.     Breath sounds: Normal breath sounds.  Abdominal:     General: Bowel sounds are normal.     Palpations: Abdomen is soft.  Musculoskeletal: Normal range of motion.        General: No tenderness or deformity.  Lymphadenopathy:     Cervical: No cervical adenopathy.  Skin:    General: Skin is warm and dry.     Findings: No erythema or rash.  Neurological:     Mental Status: He is alert and oriented to person, place, and time.  Psychiatric:        Behavior: Behavior normal.        Thought Content: Thought content normal.        Judgment: Judgment normal.      Lab Results  Component Value Date   WBC 3.3 (L) 01/03/2019   HGB 14.7 01/03/2019   HCT 42.4 01/03/2019   MCV 100.0 01/03/2019   PLT 79 (L) 01/03/2019     Chemistry      Component Value Date/Time   NA 140 01/03/2019 1106   NA 140 11/18/2018 1000   K 4.4 01/03/2019 1106   CL 105 01/03/2019 1106   CO2 31 01/03/2019 1106   BUN 16 01/03/2019 1106   BUN 13 11/18/2018 1000   CREATININE 1.24 01/03/2019 1106      Component Value Date/Time   CALCIUM 9.8 01/03/2019 1106   ALKPHOS 65 01/03/2019 1106   AST 22 01/03/2019 1106   ALT 27 01/03/2019 1106   BILITOT 0.7 01/03/2019 1106       Impression and Plan: [name] is a 78 year old white male.  He has mild leukopenia and thrombocytopenia.  I looked at his blood smear.  I really do not see anything that looked suspicious on the blood smear.  I still think that myelodysplasia is a possibility.  However, I just do not think that we have to put him through any bone marrow biopsies at this point.  We will get him through the holidays and most of winter.  I will see him back in March.   Volanda Napoleon, MD 11/9/202012:58 PM

## 2019-01-10 ENCOUNTER — Other Ambulatory Visit: Payer: Self-pay | Admitting: *Deleted

## 2019-01-10 MED ORDER — TADALAFIL 5 MG PO TABS: 5.0000 mg | ORAL_TABLET | Freq: Every day | ORAL | 3 refills | Status: DC

## 2019-01-11 ENCOUNTER — Ambulatory Visit
Admission: RE | Admit: 2019-01-11 | Discharge: 2019-01-11 | Disposition: A | Discharge status: Home / Self Care | Payer: Medicare Other | Source: Ambulatory Visit | Attending: Internal Medicine | Admitting: Internal Medicine

## 2019-01-11 ENCOUNTER — Other Ambulatory Visit (HOSPITAL_COMMUNITY)
Admission: RE | Admit: 2019-01-11 | Discharge: 2019-01-11 | Disposition: A | Discharge status: Home / Self Care | Payer: Medicare Other | Source: Ambulatory Visit | Attending: Internal Medicine | Admitting: Internal Medicine

## 2019-01-11 ENCOUNTER — Other Ambulatory Visit: Payer: Self-pay

## 2019-01-11 ENCOUNTER — Ambulatory Visit (INDEPENDENT_AMBULATORY_CARE_PROVIDER_SITE_OTHER): Payer: Medicare Other | Admitting: *Deleted

## 2019-01-11 DIAGNOSIS — Z5181 Encounter for therapeutic drug level monitoring: Secondary | ICD-10-CM | POA: Diagnosis not present

## 2019-01-11 DIAGNOSIS — E041 Nontoxic single thyroid nodule: Secondary | ICD-10-CM | POA: Diagnosis not present

## 2019-01-11 DIAGNOSIS — I359 Nonrheumatic aortic valve disorder, unspecified: Secondary | ICD-10-CM | POA: Diagnosis not present

## 2019-01-11 DIAGNOSIS — I712 Thoracic aortic aneurysm, without rupture, unspecified: Secondary | ICD-10-CM

## 2019-01-11 LAB — POCT INR: INR: 1 — AB (ref 2.0–3.0)

## 2019-01-11 NOTE — Patient Instructions (Signed)
Description   Restart Coumadin today-if okay by physcian. Take 1.5 tablets today and tomorrow then go back to your normal dose 1 tablet every day. Recheck INR 1 week post procedure. Call with any questions or new medications #[phone]

## 2019-01-12 ENCOUNTER — Other Ambulatory Visit: Payer: Self-pay | Admitting: Internal Medicine

## 2019-01-12 LAB — CYTOLOGY - NON PAP

## 2019-01-12 MED ORDER — AZITHROMYCIN 250 MG PO TABS: ORAL_TABLET | ORAL | 0 refills | Status: DC

## 2019-01-17 ENCOUNTER — Other Ambulatory Visit: Payer: Self-pay

## 2019-01-17 DIAGNOSIS — Z20828 Contact with and (suspected) exposure to other viral communicable diseases: Secondary | ICD-10-CM | POA: Diagnosis not present

## 2019-01-17 DIAGNOSIS — Z20822 Contact with and (suspected) exposure to covid-19: Secondary | ICD-10-CM

## 2019-01-18 ENCOUNTER — Ambulatory Visit (INDEPENDENT_AMBULATORY_CARE_PROVIDER_SITE_OTHER): Payer: Medicare Other | Admitting: *Deleted

## 2019-01-18 ENCOUNTER — Other Ambulatory Visit: Payer: Self-pay

## 2019-01-18 DIAGNOSIS — I712 Thoracic aortic aneurysm, without rupture, unspecified: Secondary | ICD-10-CM

## 2019-01-18 DIAGNOSIS — I359 Nonrheumatic aortic valve disorder, unspecified: Secondary | ICD-10-CM

## 2019-01-18 DIAGNOSIS — Z5181 Encounter for therapeutic drug level monitoring: Secondary | ICD-10-CM | POA: Diagnosis not present

## 2019-01-18 LAB — POCT INR: INR: 2 (ref 2.0–3.0)

## 2019-01-18 NOTE — Patient Instructions (Signed)
Description   Continue to take 1 tablet every day. Recheck INR 3  weeks. Call with any questions or new medications #[phone]

## 2019-01-19 LAB — NOVEL CORONAVIRUS, NAA: SARS-CoV-2, NAA: NOT DETECTED

## 2019-02-08 ENCOUNTER — Ambulatory Visit (INDEPENDENT_AMBULATORY_CARE_PROVIDER_SITE_OTHER): Payer: Medicare Other | Admitting: *Deleted

## 2019-02-08 ENCOUNTER — Other Ambulatory Visit: Payer: Self-pay

## 2019-02-08 DIAGNOSIS — I712 Thoracic aortic aneurysm, without rupture, unspecified: Secondary | ICD-10-CM

## 2019-02-08 DIAGNOSIS — Z5181 Encounter for therapeutic drug level monitoring: Secondary | ICD-10-CM | POA: Diagnosis not present

## 2019-02-08 DIAGNOSIS — I359 Nonrheumatic aortic valve disorder, unspecified: Secondary | ICD-10-CM | POA: Diagnosis not present

## 2019-02-08 LAB — POCT INR: INR: 3.3 — AB (ref 2.0–3.0)

## 2019-02-08 NOTE — Patient Instructions (Signed)
Description   Do not take any Warfarin today then continue to take 1 tablet every day. Recheck INR 3 weeks. Call with any questions or new medications #[phone]

## 2019-03-01 ENCOUNTER — Ambulatory Visit (INDEPENDENT_AMBULATORY_CARE_PROVIDER_SITE_OTHER): Payer: Medicare Other | Admitting: *Deleted

## 2019-03-01 ENCOUNTER — Other Ambulatory Visit: Payer: Self-pay | Admitting: Pharmacist

## 2019-03-01 ENCOUNTER — Other Ambulatory Visit: Payer: Self-pay

## 2019-03-01 DIAGNOSIS — I712 Thoracic aortic aneurysm, without rupture, unspecified: Secondary | ICD-10-CM

## 2019-03-01 DIAGNOSIS — Z5181 Encounter for therapeutic drug level monitoring: Secondary | ICD-10-CM

## 2019-03-01 DIAGNOSIS — I359 Nonrheumatic aortic valve disorder, unspecified: Secondary | ICD-10-CM

## 2019-03-01 LAB — POCT INR: INR: 3.4 — AB (ref 2.0–3.0)

## 2019-03-01 MED ORDER — WARFARIN SODIUM 7.5 MG PO TABS: 7.5000 mg | ORAL_TABLET | Freq: Every day | ORAL | 0 refills | Status: DC

## 2019-03-01 NOTE — Patient Instructions (Signed)
Description   Do not take any Warfarin today then continue to take 1 tablet every day.  Eat extra servings of greens while you are on the antibiotic. Recheck INR 1 week. Call with any questions or new medications #[phone]

## 2019-03-02 ENCOUNTER — Other Ambulatory Visit: Payer: Self-pay | Admitting: Cardiovascular Disease

## 2019-03-02 NOTE — Telephone Encounter (Signed)
Spoke with pt & he is using [facility] not [facility]. I removed [facility] off [facility] list per pt.

## 2019-03-03 ENCOUNTER — Other Ambulatory Visit: Payer: Self-pay | Admitting: Cardiovascular Disease

## 2019-03-04 ENCOUNTER — Other Ambulatory Visit: Payer: Self-pay | Admitting: Cardiovascular Disease

## 2019-03-04 DIAGNOSIS — H2513 Age-related nuclear cataract, bilateral: Secondary | ICD-10-CM | POA: Diagnosis not present

## 2019-03-04 DIAGNOSIS — H25013 Cortical age-related cataract, bilateral: Secondary | ICD-10-CM | POA: Diagnosis not present

## 2019-03-04 DIAGNOSIS — H52203 Unspecified astigmatism, bilateral: Secondary | ICD-10-CM | POA: Diagnosis not present

## 2019-03-04 NOTE — Telephone Encounter (Signed)
Spoke with pt yesterday regarding where to send the refill (see note from yesterday) and [facility] is not longer in the pt's network so called them to let them know that the pt is using [facility] at this time, she updated her system while on the phone. Will deny this one to [facility] at this time since one was sent to [facility] per pt.

## 2019-03-08 ENCOUNTER — Ambulatory Visit (INDEPENDENT_AMBULATORY_CARE_PROVIDER_SITE_OTHER): Payer: Medicare Other | Admitting: *Deleted

## 2019-03-08 ENCOUNTER — Other Ambulatory Visit: Payer: Self-pay

## 2019-03-08 DIAGNOSIS — Z5181 Encounter for therapeutic drug level monitoring: Secondary | ICD-10-CM

## 2019-03-08 DIAGNOSIS — I359 Nonrheumatic aortic valve disorder, unspecified: Secondary | ICD-10-CM

## 2019-03-08 DIAGNOSIS — I712 Thoracic aortic aneurysm, without rupture, unspecified: Secondary | ICD-10-CM

## 2019-03-08 LAB — POCT INR: INR: 3.8 — AB (ref 2.0–3.0)

## 2019-03-08 NOTE — Patient Instructions (Signed)
Description   Hold today, then start taking 1 tablet daily except for 1/2 a tablet on Sundays. Recheck INR in 3 weeks. Call with any questions or new medications #[phone]

## 2019-03-12 ENCOUNTER — Ambulatory Visit: Payer: Medicare Other | Attending: Internal Medicine

## 2019-03-12 DIAGNOSIS — Z23 Encounter for immunization: Secondary | ICD-10-CM | POA: Diagnosis not present

## 2019-03-12 NOTE — Progress Notes (Signed)
   Covid-19 Vaccination Clinic  Name:  Patrick [name]    MRN: [id number] DOB: 1940-12-14  03/12/2019  [name] was observed post Covid-19 immunization for 15 minutes without incidence. He was provided with Vaccine Information Sheet and instruction to access the V-Safe system.   [name] was instructed to call 911 with any severe reactions post vaccine: Marland Kitchen Difficulty breathing  . Swelling of your face and throat  . A fast heartbeat  . A bad rash all over your body  . Dizziness and weakness    Immunizations Administered    Name Date Dose VIS Date Route   Pfizer COVID-19 Vaccine 03/12/2019  1:22 PM 0.3 mL 02/04/2019 Intramuscular   Manufacturer: [employer], [employer]   Lot: [id number]   [place]: [id number]

## 2019-03-15 DIAGNOSIS — H25011 Cortical age-related cataract, right eye: Secondary | ICD-10-CM | POA: Diagnosis not present

## 2019-03-15 DIAGNOSIS — H2511 Age-related nuclear cataract, right eye: Secondary | ICD-10-CM | POA: Diagnosis not present

## 2019-03-15 DIAGNOSIS — H25811 Combined forms of age-related cataract, right eye: Secondary | ICD-10-CM | POA: Diagnosis not present

## 2019-03-15 DIAGNOSIS — H52201 Unspecified astigmatism, right eye: Secondary | ICD-10-CM | POA: Diagnosis not present

## 2019-03-22 ENCOUNTER — Other Ambulatory Visit: Payer: Self-pay

## 2019-03-22 ENCOUNTER — Telehealth (HOSPITAL_COMMUNITY): Payer: Self-pay

## 2019-03-22 DIAGNOSIS — I714 Abdominal aortic aneurysm, without rupture, unspecified: Secondary | ICD-10-CM

## 2019-03-22 NOTE — Telephone Encounter (Signed)

## 2019-03-23 ENCOUNTER — Encounter: Payer: Self-pay | Admitting: Vascular Surgery

## 2019-03-23 ENCOUNTER — Other Ambulatory Visit: Payer: Self-pay

## 2019-03-23 ENCOUNTER — Ambulatory Visit (HOSPITAL_COMMUNITY)
Admission: RE | Admit: 2019-03-23 | Discharge: 2019-03-23 | Disposition: A | Discharge status: Home / Self Care | Payer: Medicare Other | Source: Ambulatory Visit | Attending: Vascular Surgery | Admitting: Vascular Surgery

## 2019-03-23 ENCOUNTER — Ambulatory Visit (INDEPENDENT_AMBULATORY_CARE_PROVIDER_SITE_OTHER): Payer: Medicare Other | Admitting: Vascular Surgery

## 2019-03-23 VITALS — BP 141/84 | HR 55 | Temp 97.9°F | Resp 18 | Ht 72.0 in | Wt 175.0 lb

## 2019-03-23 DIAGNOSIS — Z48812 Encounter for surgical aftercare following surgery on the circulatory system: Secondary | ICD-10-CM

## 2019-03-23 DIAGNOSIS — I714 Abdominal aortic aneurysm, without rupture, unspecified: Secondary | ICD-10-CM

## 2019-03-23 NOTE — Progress Notes (Signed)
Patient name: Patrick [name] MRN: [id number] DOB: August 09, 1940 Sex: male  REASON FOR VISIT:   Follow-up after endovascular aneurysm repair.  HPI:   Patrick [name] is a pleasant 79 y.o. male who I last saw on 02/10/2018.  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  When I saw him last, by duplex the maximum diameter of his infrarenal aorta was 2.6 cm.  He was not a smoker.  He was on a statin but was not on aspirin because of his Coumadin for aortic valve replacement.  Since I saw him last he is doing well.  He did undergo a unique procedure at [facility] for BPH.  This is prostatic aortic embolization which apparently shrinks the prostate and he had an excellent result with that and is very happy with that.  He denies any abdominal pain or back pain.  He does occasionally have flareups of diverticulitis.  He is not a smoker.  He is on a statin.  He does not take aspirin because he is on Coumadin as he is status past aortic valve replacement by Dr. Arvid Right.  Past Medical History:  Diagnosis Date  . Anticoagulant long-term use   . Aortic insufficiency    a. 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  b. 06/2016: echo showed EF of 60-65%, Grade 1 DD, and normal functioning of the mechanical aortic prosthesis.  Marland Kitchen BPH (benign prostatic hyperplasia)   . Bradycardia    Beta blocker discontinued  . Diverticulosis   . HTN (hypertension)   . 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  . Migraine headache   . S/P cardiac cath    [facility] in 2003 demonstrated normal coronary arteries  . Thrombocytopenia ([place]) 01/03/2019    Family History  Problem Relation Age of Onset  . Cancer Mother        ovarian  . CAD Father   . Colon cancer Neg Hx     SOCIAL HISTORY: Social History   Tobacco Use  . Smoking status: Former Smoker    Packs/day: 1.00    Years: 10.00    Pack years: 10.00    Types: Cigarettes    Quit date: 08/08/1975    Years since quitting:  43.6  . Smokeless tobacco: Never Used  Substance Use Topics  . Alcohol use: Yes    Alcohol/week: 0.0 standard drinks    Comment: ONLY DRINK ON FRIDAYS    Allergies  Allergen Reactions  . Codeine Rash  . Terbinafine Itching    Current Outpatient Medications  Medication Sig Dispense Refill  . amLODipine (NORVASC) 5 MG tablet Take 1 tablet (5 mg total) by mouth daily. 90 tablet 3  . atorvastatin (LIPITOR) 10 MG tablet TAKE ONE TABLET BY MOUTH DAILY 90 tablet 3  . desonide (DESOWEN) 0.05 % cream Apply 1 application topically daily as needed. 60 g 3  . diphenoxylate-atropine (LOMOTIL) 2.5-0.025 MG tablet Take 1 tablet by mouth 4 (four) times daily as needed for diarrhea or loose stools. 60 tablet 1  . pantoprazole (PROTONIX) 40 MG tablet TAKE ONE TABLET BY MOUTH DAILY 90 tablet 3  . SUMAtriptan (IMITREX) 50 MG tablet take 1/2 tablet by mouth every 2 hours if needed as directed 9 tablet 3  . tadalafil (CIALIS) 5 MG tablet Take 1 tablet (5 mg total) by mouth daily. 30 tablet 3  . triamcinolone cream (KENALOG) 0.1 % Apply 1 application topically as needed.     . warfarin (  COUMADIN) 7.5 MG tablet Take 1 tablet (7.5 mg total) by mouth daily. 90 tablet 0  . amoxicillin (AMOXIL) 500 MG tablet Take 4 tablets by mouth one hour prior to dental appointments (Patient not taking: Reported on 01/03/2019) 20 tablet 1  . azithromycin (ZITHROMAX Z-PAK) 250 MG tablet As directed (Patient not taking: Reported on 03/23/2019) 6 tablet 0   No current facility-administered medications for this visit.    REVIEW OF SYSTEMS:  [X]  denotes positive finding, [ ]  denotes negative finding Cardiac  Comments:  Chest pain or chest pressure:    Shortness of breath upon exertion:    Short of breath when lying flat:    Irregular heart rhythm:        Vascular    Pain in calf, thigh, or hip brought on by ambulation:    Pain in feet at night that wakes you up from your sleep:     Blood clot in your veins:    Leg  swelling:         Pulmonary    Oxygen at home:    Productive cough:     Wheezing:         Neurologic    Sudden weakness in arms or legs:     Sudden numbness in arms or legs:     Sudden onset of difficulty speaking or slurred speech:    Temporary loss of vision in one eye:     Problems with dizziness:         Gastrointestinal    Blood in stool:     Vomited blood:         Genitourinary    Burning when urinating:     Blood in urine:        Psychiatric    Major depression:         Hematologic    Bleeding problems:    Problems with blood clotting too easily:        Skin    Rashes or ulcers:        Constitutional    Fever or chills:     PHYSICAL EXAM:   Vitals:   03/23/19 0835  BP: (!) 141/84  Pulse: (!) 55  Resp: 18  Temp: 97.9 F (36.6 C)  SpO2: 99%  Weight: 175 lb (79.4 kg)  Height: 6' (1.829 m)    GENERAL: The patient is a well-nourished male, in no acute distress. The vital signs are documented above. CARDIAC: There is a regular rate and rhythm.  VASCULAR: I do not detect carotid bruits. He has palpable femoral pulses and palpable pedal pulses bilaterally. He has no significant lower extremity swelling. PULMONARY: There is good air exchange bilaterally without wheezing or rales. ABDOMEN: Soft and non-tender with normal pitched bowel sounds.  I do not palpate an aneurysm. MUSCULOSKELETAL: There are no major deformities or cyanosis. NEUROLOGIC: No focal weakness or paresthesias are detected. SKIN: There are no ulcers or rashes noted. PSYCHIATRIC: The patient has a normal affect.  DATA:    DUPLEX ABDOMINAL AORTA: I have independently interpreted the duplex of his abdominal aorta.  The maximum diameter is 2.57 cm which is unchanged compared to the study a year ago.  He does have a left renal cyst.  MEDICAL ISSUES:   STATUS POST ENDOVASCULAR REPAIR OF SACCULAR ANEURYSM: The patient is undergone successful endovascular repair of a saccular aneurysm in  October 2018.  This was repaired with a 21 mm x 21 mm x 10 cm covered stent.  He has been doing well and his graft is in good position with no evidence of endoleak and no change in his aorta.  For this reason I think we can stretch his follow-up out to 2 years.  I will order a follow-up aortic duplex in 2 years and I will see him back at that time.  He knows to call sooner if he has problems.  Deitra Mayo [facility] [phone]

## 2019-03-24 ENCOUNTER — Other Ambulatory Visit: Payer: Self-pay | Admitting: *Deleted

## 2019-03-24 DIAGNOSIS — I714 Abdominal aortic aneurysm, without rupture, unspecified: Secondary | ICD-10-CM

## 2019-03-28 ENCOUNTER — Other Ambulatory Visit: Payer: Self-pay

## 2019-03-28 ENCOUNTER — Ambulatory Visit (INDEPENDENT_AMBULATORY_CARE_PROVIDER_SITE_OTHER): Payer: Medicare Other | Admitting: *Deleted

## 2019-03-28 DIAGNOSIS — I359 Nonrheumatic aortic valve disorder, unspecified: Secondary | ICD-10-CM

## 2019-03-28 DIAGNOSIS — I712 Thoracic aortic aneurysm, without rupture, unspecified: Secondary | ICD-10-CM

## 2019-03-28 DIAGNOSIS — Z5181 Encounter for therapeutic drug level monitoring: Secondary | ICD-10-CM

## 2019-03-28 LAB — POCT INR: INR: 2.4 (ref 2.0–3.0)

## 2019-03-28 NOTE — Patient Instructions (Signed)
Description   Continue taking 1 tablet daily except for 1/2 a tablet on Sundays. Recheck INR in 4 weeks. Call with any questions or new medications #[phone]. Will be starting Jantoven by 03/17/19

## 2019-03-29 DIAGNOSIS — H25812 Combined forms of age-related cataract, left eye: Secondary | ICD-10-CM | POA: Diagnosis not present

## 2019-03-29 DIAGNOSIS — H25012 Cortical age-related cataract, left eye: Secondary | ICD-10-CM | POA: Diagnosis not present

## 2019-03-29 DIAGNOSIS — H52202 Unspecified astigmatism, left eye: Secondary | ICD-10-CM | POA: Diagnosis not present

## 2019-03-29 DIAGNOSIS — H2512 Age-related nuclear cataract, left eye: Secondary | ICD-10-CM | POA: Diagnosis not present

## 2019-04-02 ENCOUNTER — Ambulatory Visit: Payer: Medicare Other | Attending: Internal Medicine

## 2019-04-02 DIAGNOSIS — Z23 Encounter for immunization: Secondary | ICD-10-CM | POA: Insufficient documentation

## 2019-04-02 NOTE — Progress Notes (Signed)
   Covid-19 Vaccination Clinic  Name:  Patrick [name]    MRN: [id number] DOB: [birth year]  04/02/2019  [name] was observed post Covid-19 immunization for 15 minutes without incidence. He was provided with Vaccine Information Sheet and instruction to access the V-Safe system.   [name] was instructed to call 911 with any severe reactions post vaccine: Marland Kitchen Difficulty breathing  . Swelling of your face and throat  . A fast heartbeat  . A bad rash all over your body  . Dizziness and weakness    Immunizations Administered    Name Date Dose VIS Date Route   Pfizer COVID-19 Vaccine 04/02/2019  1:47 AM 0.3 mL 02/04/2019 Intramuscular   Manufacturer: [place]   Lot: [id number]   [place]: [id number]

## 2019-04-18 ENCOUNTER — Telehealth: Payer: Self-pay | Admitting: Internal Medicine

## 2019-04-18 NOTE — Telephone Encounter (Signed)
Received call from [place] at [employer]. States pt had called lab for covid results. States pt is at an airport in [place] for flight to [place], has negative covid report but report does not include his name. Pt is requesting results emailed to him to show to airport officials. [facility] cannot email results. NT called practice, spoke to [place] regarding situation. Email address verified. States will review with team.  Of note, testing was from 01/17/2019.

## 2019-04-19 NOTE — Telephone Encounter (Signed)
Spoke with pt yesterday and advised him to access his mychart and he can see the results.

## 2019-04-29 ENCOUNTER — Ambulatory Visit (INDEPENDENT_AMBULATORY_CARE_PROVIDER_SITE_OTHER): Payer: Medicare Other | Admitting: *Deleted

## 2019-04-29 ENCOUNTER — Other Ambulatory Visit: Payer: Self-pay

## 2019-04-29 DIAGNOSIS — Z5181 Encounter for therapeutic drug level monitoring: Secondary | ICD-10-CM | POA: Diagnosis not present

## 2019-04-29 DIAGNOSIS — I712 Thoracic aortic aneurysm, without rupture, unspecified: Secondary | ICD-10-CM

## 2019-04-29 DIAGNOSIS — I359 Nonrheumatic aortic valve disorder, unspecified: Secondary | ICD-10-CM | POA: Diagnosis not present

## 2019-04-29 LAB — POCT INR: INR: 2.3 (ref 2.0–3.0)

## 2019-04-29 NOTE — Patient Instructions (Signed)
Description   Continue taking 1 tablet daily except for 1/2 tablet on Sundays. Recheck INR in 5 weeks. Call with any questions or new medications #[phone]. Started [place] in January 2021.

## 2019-05-02 ENCOUNTER — Encounter: Payer: Self-pay | Admitting: Hematology & Oncology

## 2019-05-02 ENCOUNTER — Other Ambulatory Visit: Payer: Self-pay

## 2019-05-02 ENCOUNTER — Inpatient Hospital Stay: Payer: Medicare Other | Attending: Hematology & Oncology

## 2019-05-02 ENCOUNTER — Inpatient Hospital Stay (HOSPITAL_BASED_OUTPATIENT_CLINIC_OR_DEPARTMENT_OTHER): Payer: Medicare Other | Admitting: Hematology & Oncology

## 2019-05-02 VITALS — BP 134/78 | HR 60 | Temp 97.3°F | Resp 17 | Wt 173.0 lb

## 2019-05-02 DIAGNOSIS — D696 Thrombocytopenia, unspecified: Secondary | ICD-10-CM

## 2019-05-02 DIAGNOSIS — D72819 Decreased white blood cell count, unspecified: Secondary | ICD-10-CM | POA: Diagnosis not present

## 2019-05-02 LAB — CBC WITH DIFFERENTIAL (CANCER CENTER ONLY)
Abs Immature Granulocytes: 0.01 10*3/uL (ref 0.00–0.07)
Basophils Absolute: 0 10*3/uL (ref 0.0–0.1)
Basophils Relative: 0 %
Eosinophils Absolute: 0 10*3/uL (ref 0.0–0.5)
Eosinophils Relative: 1 %
HCT: 41.1 % (ref 39.0–52.0)
Hemoglobin: 14.4 g/dL (ref 13.0–17.0)
Immature Granulocytes: 0 %
Lymphocytes Relative: 18 %
Lymphs Abs: 0.6 10*3/uL — ABNORMAL LOW (ref 0.7–4.0)
MCH: 35 pg — ABNORMAL HIGH (ref 26.0–34.0)
MCHC: 35 g/dL (ref 30.0–36.0)
MCV: 99.8 fL (ref 80.0–100.0)
Monocytes Absolute: 0.2 10*3/uL (ref 0.1–1.0)
Monocytes Relative: 7 %
Neutro Abs: 2.6 10*3/uL (ref 1.7–7.7)
Neutrophils Relative %: 74 %
Platelet Count: 83 10*3/uL — ABNORMAL LOW (ref 150–400)
RBC: 4.12 MIL/uL — ABNORMAL LOW (ref 4.22–5.81)
RDW: 13.7 % (ref 11.5–15.5)
WBC Count: 3.5 10*3/uL — ABNORMAL LOW (ref 4.0–10.5)
nRBC: 0 % (ref 0.0–0.2)

## 2019-05-02 LAB — CMP (CANCER CENTER ONLY)
ALT: 23 U/L (ref 0–44)
AST: 21 U/L (ref 15–41)
Albumin: 4.6 g/dL (ref 3.5–5.0)
Alkaline Phosphatase: 75 U/L (ref 38–126)
Anion gap: 5 (ref 5–15)
BUN: 17 mg/dL (ref 8–23)
CO2: 31 mmol/L (ref 22–32)
Calcium: 10.2 mg/dL (ref 8.9–10.3)
Chloride: 105 mmol/L (ref 98–111)
Creatinine: 1.28 mg/dL — ABNORMAL HIGH (ref 0.61–1.24)
GFR, Est AFR Am: >60 mL/min (ref >60)
GFR, Estimated: 54 mL/min — ABNORMAL LOW (ref >60)
Glucose, Bld: 98 mg/dL (ref 70–99)
Potassium: 4.6 mmol/L (ref 3.5–5.1)
Sodium: 141 mmol/L (ref 135–145)
Total Bilirubin: 0.8 mg/dL (ref 0.3–1.2)
Total Protein: 6.6 g/dL (ref 6.5–8.1)

## 2019-05-02 LAB — PLATELET BY CITRATE

## 2019-05-02 LAB — SAVE SMEAR(SSMR), FOR PROVIDER SLIDE REVIEW

## 2019-05-02 NOTE — Progress Notes (Signed)
Hematology and Oncology Follow Up Visit  Patrick [name] [id number] April 08, 1940 79 y.o. 05/02/2019   Principle Diagnosis:   Thrombocytopenia and leukopenia  Current Therapy:    Observation     Interim History:  Patrick [name] is back for his follow-up.  We last saw him back last year.  Since we last saw him, he actually has been down to [place].  He is in the [employer].  He says I doubt on result, it is quite bad with the coronavirus.  He has had his vaccines.  He has had cataract surgery also.  He has had no problems with infection.  He has had no bleeding.  He has had no bruising.  There is been no nausea or vomiting.  His appetite has been good.  He had a fairly quiet holiday season.   Overall, I think his performance status is ECOG 0.  Medications:  Current Outpatient Medications:  .  amLODipine (NORVASC) 5 MG tablet, Take 1 tablet (5 mg total) by mouth daily., Disp: 90 tablet, Rfl: 3 .  amoxicillin (AMOXIL) 500 MG tablet, Take 4 tablets by mouth one hour prior to dental appointments (Patient not taking: Reported on 01/03/2019), Disp: 20 tablet, Rfl: 1 .  atorvastatin (LIPITOR) 10 MG tablet, TAKE ONE TABLET BY MOUTH DAILY, Disp: 90 tablet, Rfl: 3 .  azithromycin (ZITHROMAX Z-PAK) 250 MG tablet, As directed (Patient not taking: Reported on 03/23/2019), Disp: 6 tablet, Rfl: 0 .  desonide (DESOWEN) 0.05 % cream, Apply 1 application topically daily as needed., Disp: 60 g, Rfl: 3 .  diphenoxylate-atropine (LOMOTIL) 2.5-0.025 MG tablet, Take 1 tablet by mouth 4 (four) times daily as needed for diarrhea or loose stools., Disp: 60 tablet, Rfl: 1 .  pantoprazole (PROTONIX) 40 MG tablet, TAKE ONE TABLET BY MOUTH DAILY, Disp: 90 tablet, Rfl: 3 .  SUMAtriptan (IMITREX) 50 MG tablet, take 1/2 tablet by mouth every 2 hours if needed as directed, Disp: 9 tablet, Rfl: 3 .  tadalafil (CIALIS) 5 MG tablet, Take 1 tablet (5 mg total) by mouth daily., Disp: 30 tablet, Rfl: 3 .  triamcinolone  cream (KENALOG) 0.1 %, Apply 1 application topically as needed. , Disp: , Rfl:  .  warfarin (COUMADIN) 7.5 MG tablet, Take 1 tablet (7.5 mg total) by mouth daily., Disp: 90 tablet, Rfl: 0  Allergies:  Allergies  Allergen Reactions  . Codeine Rash  . Terbinafine Itching    Past Medical History, Surgical history, Social history, and Family History were reviewed and updated.  Review of Systems: Review of Systems  Constitutional: Negative.   HENT:  Negative.   Eyes: Negative.   Respiratory: Negative.   Cardiovascular: Negative.   Gastrointestinal: Negative.   Endocrine: Negative.   Genitourinary: Negative.    Musculoskeletal: Negative.   Skin: Negative.   Neurological: Negative.   Hematological: Negative.   Psychiatric/Behavioral: Negative.     Physical Exam:  weight is 173 lb (78.5 kg). His temporal temperature is 97.3 F (36.3 C) (abnormal). His blood pressure is 134/78 and his pulse is 60. His respiration is 17 and oxygen saturation is 99%.   Wt Readings from Last 3 Encounters:  05/02/19 173 lb (78.5 kg)  03/23/19 175 lb (79.4 kg)  01/03/19 172 lb 4 oz (78.1 kg)    Physical Exam Vitals reviewed.  HENT:     Head: Normocephalic and atraumatic.  Eyes:     Pupils: Pupils are equal, round, and reactive to light.  Cardiovascular:     Rate and  Rhythm: Normal rate and regular rhythm.     Heart sounds: Normal heart sounds.     Comments: Cardiac exam shows a regular rate and rhythm.  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  I hear no murmurs or rubs or bruits. Pulmonary:     Effort: Pulmonary effort is normal.     Breath sounds: Normal breath sounds.  Abdominal:     General: Bowel sounds are normal.     Palpations: Abdomen is soft.  Musculoskeletal:        General: No tenderness or deformity. Normal range of motion.     Cervical back: Normal range of motion.  Lymphadenopathy:     Cervical: No cervical adenopathy.  Skin:    General: Skin is  warm and dry.     Findings: No erythema or rash.  Neurological:     Mental Status: He is alert and oriented to person, place, and time.  Psychiatric:        Behavior: Behavior normal.        Thought Content: Thought content normal.        Judgment: Judgment normal.      Lab Results  Component Value Date   WBC 3.5 (L) 05/02/2019   HGB 14.4 05/02/2019   HCT 41.1 05/02/2019   MCV 99.8 05/02/2019   PLT 83 (L) 05/02/2019     Chemistry      Component Value Date/Time   NA 140 01/03/2019 1106   NA 140 11/18/2018 1000   K 4.4 01/03/2019 1106   CL 105 01/03/2019 1106   CO2 31 01/03/2019 1106   BUN 16 01/03/2019 1106   BUN 13 11/18/2018 1000   CREATININE 1.24 01/03/2019 1106      Component Value Date/Time   CALCIUM 9.8 01/03/2019 1106   ALKPHOS 65 01/03/2019 1106   AST 22 01/03/2019 1106   ALT 27 01/03/2019 1106   BILITOT 0.7 01/03/2019 1106       Impression and Plan: Patrick [name] is a 79 year old white male.  He has mild leukopenia and thrombocytopenia.  I looked at his blood smear.  I really do not see anything that looked suspicious on the blood smear.  I still think that myelodysplasia is a possibility.  However, I just do not think that we have to put him through any bone marrow biopsies at this point.  We will get him back in 6 months now.  I think this would be reasonable.  He certainly knows to give [place] a call if he has problems before then.     Volanda Napoleon, MD 3/8/20212:53 PM

## 2019-06-02 ENCOUNTER — Ambulatory Visit (INDEPENDENT_AMBULATORY_CARE_PROVIDER_SITE_OTHER): Payer: Medicare Other | Admitting: *Deleted

## 2019-06-02 ENCOUNTER — Other Ambulatory Visit: Payer: Self-pay

## 2019-06-02 DIAGNOSIS — I712 Thoracic aortic aneurysm, without rupture, unspecified: Secondary | ICD-10-CM

## 2019-06-02 DIAGNOSIS — I359 Nonrheumatic aortic valve disorder, unspecified: Secondary | ICD-10-CM

## 2019-06-02 DIAGNOSIS — Z5181 Encounter for therapeutic drug level monitoring: Secondary | ICD-10-CM

## 2019-06-02 LAB — POCT INR: INR: 2.8 (ref 2.0–3.0)

## 2019-06-02 NOTE — Patient Instructions (Addendum)
Description   Continue taking 1 tablet daily. Recheck INR in 4- 5 weeks. Call with any questions or new medications #[phone]. Started [place] in January 2021.

## 2019-06-16 ENCOUNTER — Other Ambulatory Visit: Payer: Self-pay | Admitting: Cardiovascular Disease

## 2019-06-23 ENCOUNTER — Other Ambulatory Visit: Payer: Self-pay

## 2019-06-23 ENCOUNTER — Encounter: Payer: Self-pay | Admitting: Internal Medicine

## 2019-06-23 ENCOUNTER — Ambulatory Visit (INDEPENDENT_AMBULATORY_CARE_PROVIDER_SITE_OTHER): Payer: Medicare Other | Admitting: Internal Medicine

## 2019-06-23 VITALS — BP 136/72 | HR 55 | Temp 98.2°F | Ht 72.0 in | Wt 176.0 lb

## 2019-06-23 DIAGNOSIS — R202 Paresthesia of skin: Secondary | ICD-10-CM | POA: Diagnosis not present

## 2019-06-23 DIAGNOSIS — M1990 Unspecified osteoarthritis, unspecified site: Secondary | ICD-10-CM | POA: Diagnosis not present

## 2019-06-23 DIAGNOSIS — E559 Vitamin D deficiency, unspecified: Secondary | ICD-10-CM | POA: Diagnosis not present

## 2019-06-23 DIAGNOSIS — Z7901 Long term (current) use of anticoagulants: Secondary | ICD-10-CM | POA: Diagnosis not present

## 2019-06-23 DIAGNOSIS — E785 Hyperlipidemia, unspecified: Secondary | ICD-10-CM | POA: Diagnosis not present

## 2019-06-23 DIAGNOSIS — R972 Elevated prostate specific antigen [PSA]: Secondary | ICD-10-CM

## 2019-06-23 DIAGNOSIS — I10 Essential (primary) hypertension: Secondary | ICD-10-CM | POA: Diagnosis not present

## 2019-06-23 NOTE — Progress Notes (Signed)
Subjective:  Patient ID: Patrick [name], male    DOB: 10/26/1940  Age: 79 y.o. MRN: [id number]  CC: No chief complaint on file.   HPI Patrick [name] presents for BPH, HTN, OA f/u  Outpatient Medications Prior to Visit  Medication Sig Dispense Refill  . amLODipine (NORVASC) 5 MG tablet Take 1 tablet (5 mg total) by mouth daily. 90 tablet 3  . atorvastatin (LIPITOR) 10 MG tablet TAKE ONE TABLET BY MOUTH DAILY 90 tablet 3  . desonide (DESOWEN) 0.05 % cream Apply 1 application topically daily as needed. 60 g 3  . diphenoxylate-atropine (LOMOTIL) 2.5-0.025 MG tablet Take 1 tablet by mouth 4 (four) times daily as needed for diarrhea or loose stools. 60 tablet 1  . JANTOVEN 7.5 MG tablet TAKE ONE TABLET BY MOUTH ONE TIME DAILY 90 tablet 0  . pantoprazole (PROTONIX) 40 MG tablet TAKE ONE TABLET BY MOUTH DAILY 90 tablet 3  . SUMAtriptan (IMITREX) 50 MG tablet take 1/2 tablet by mouth every 2 hours if needed as directed 9 tablet 3  . tadalafil (CIALIS) 5 MG tablet Take 1 tablet (5 mg total) by mouth daily. 30 tablet 3  . triamcinolone cream (KENALOG) 0.1 % Apply 1 application topically as needed.     Marland Kitchen amoxicillin (AMOXIL) 500 MG tablet Take 4 tablets by mouth one hour prior to dental appointments (Patient not taking: Reported on 06/23/2019) 20 tablet 1  . azithromycin (ZITHROMAX Z-PAK) 250 MG tablet As directed (Patient not taking: Reported on 03/23/2019) 6 tablet 0   No facility-administered medications prior to visit.    ROS: Review of Systems  Constitutional: Negative for appetite change, fatigue and unexpected weight change.  HENT: Negative for congestion, nosebleeds, sneezing, sore throat and trouble swallowing.   Eyes: Negative for itching and visual disturbance.  Respiratory: Negative for cough.   Cardiovascular: Negative for chest pain, palpitations and leg swelling.  Gastrointestinal: Negative for abdominal distention, blood in stool, diarrhea and nausea.  Genitourinary: Negative  for frequency and hematuria.  Musculoskeletal: Positive for arthralgias. Negative for back pain, gait problem, joint swelling and neck pain.  Skin: Negative for rash.  Neurological: Negative for dizziness, tremors, speech difficulty and weakness.  Psychiatric/Behavioral: Negative for agitation, dysphoric mood, sleep disturbance and suicidal ideas. The patient is not nervous/anxious.     Objective:  BP 136/72 (BP Location: Left Arm, Patient Position: Sitting, Cuff Size: Large)   Pulse (!) 55   Temp 98.2 F (36.8 C) (Oral)   Ht 6' (1.829 m)   Wt 176 lb (79.8 kg)   SpO2 98%   BMI 23.87 kg/m   BP Readings from Last 3 Encounters:  06/23/19 136/72  05/02/19 134/78  03/23/19 (!) 141/84    Wt Readings from Last 3 Encounters:  06/23/19 176 lb (79.8 kg)  05/02/19 173 lb (78.5 kg)  03/23/19 175 lb (79.4 kg)    Physical Exam Constitutional:      General: He is not in acute distress.    Appearance: He is well-developed.     Comments: NAD  Eyes:     Conjunctiva/sclera: Conjunctivae normal.     Pupils: Pupils are equal, round, and reactive to light.  Neck:     Thyroid: No thyromegaly.     Vascular: No JVD.  Cardiovascular:     Rate and Rhythm: Normal rate and regular rhythm.     Heart sounds: Normal heart sounds. No murmur. No friction rub. No gallop.   Pulmonary:     Effort:  Pulmonary effort is normal. No respiratory distress.     Breath sounds: Normal breath sounds. No wheezing or rales.  Chest:     Chest wall: No tenderness.  Abdominal:     General: Bowel sounds are normal. There is no distension.     Palpations: Abdomen is soft. There is no mass.     Tenderness: There is no abdominal tenderness. There is no guarding or rebound.  Musculoskeletal:        General: No tenderness. Normal range of motion.     Cervical back: Normal range of motion.  Lymphadenopathy:     Cervical: No cervical adenopathy.  Skin:    General: Skin is warm and dry.     Findings: No rash.    Neurological:     Mental Status: He is alert and oriented to person, place, and time.     Cranial Nerves: No cranial nerve deficit.     Motor: No abnormal muscle tone.     Coordination: Coordination normal.     Gait: Gait normal.     Deep Tendon Reflexes: Reflexes are normal and symmetric.  Psychiatric:        Behavior: Behavior normal.        Thought Content: Thought content normal.        Judgment: Judgment normal.     Lab Results  Component Value Date   WBC 3.5 (L) 05/02/2019   HGB 14.4 05/02/2019   HCT 41.1 05/02/2019   PLT 83 (L) 05/02/2019   GLUCOSE 98 05/02/2019   CHOL 139 12/22/2017   TRIG 214.0 (H) 12/22/2017   HDL 45.80 12/22/2017   LDLDIRECT 67.0 12/22/2017   LDLCALC 69 11/07/2016   ALT 23 05/02/2019   AST 21 05/02/2019   NA 141 05/02/2019   K 4.6 05/02/2019   CL 105 05/02/2019   CREATININE 1.28 (H) 05/02/2019   BUN 17 05/02/2019   CO2 31 05/02/2019   TSH 0.56 11/30/2018   PSA 7.43 (H) 12/22/2017   INR 2.8 06/02/2019   HGBA1C 5.3 08/20/2016    VAS [place] EVAR DUPLEX  Result Date: 03/23/2019 Endovascular Aortic Repair Study (EVAR) Vascular Interventions: 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 Limitations: Air/bowel gas.  Comparison Study: No significant change since prior exam Performing Technologist: Alvia Grove RVT  Examination Guidelines: A complete evaluation includes B-mode imaging, spectral Doppler, color Doppler, and power Doppler as needed of all accessible portions of each vessel. Bilateral testing is considered an integral part of a complete examination. Limited examinations for reoccurring indications may be performed as noted.  Endovascular Aortic Repair (EVAR): +----------+----------------+-------------------+-------------------+           Diameter AP (cm)Diameter Trans (cm)Velocities (cm/sec) +----------+----------------+-------------------+-------------------+ Aorta     2.57            2.4                1                    +----------+----------------+-------------------+-------------------+ Right Limb1.33            1.17               1                   +----------+----------------+-------------------+-------------------+ Left Limb 1.53            1.42               1                   +----------+----------------+-------------------+-------------------+  Summary: Abdominal Aorta: 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 The largest aortic diameter remains essentially unchanged compared to prior exam. Previous diameter measurement was 2.4 cm obtained on 11/28/2016. NOTE: 5.71 x 5.88 left renal cyst.  *See table(s) above for measurements and observations.  Electronically signed by Deitra Mayo MD on 03/23/2019 at 9:34:42 AM.   Final     Assessment & Plan:    Follow-up: No follow-ups on file.  Walker Kehr, MD

## 2019-06-23 NOTE — Patient Instructions (Addendum)
Voltaren gel CBD cream CBD gummies  SebaMed soap, lotion

## 2019-06-27 DIAGNOSIS — Z85828 Personal history of other malignant neoplasm of skin: Secondary | ICD-10-CM | POA: Diagnosis not present

## 2019-06-27 DIAGNOSIS — L821 Other seborrheic keratosis: Secondary | ICD-10-CM | POA: Diagnosis not present

## 2019-06-27 DIAGNOSIS — C44319 Basal cell carcinoma of skin of other parts of face: Secondary | ICD-10-CM | POA: Diagnosis not present

## 2019-06-27 DIAGNOSIS — R202 Paresthesia of skin: Secondary | ICD-10-CM | POA: Insufficient documentation

## 2019-06-27 DIAGNOSIS — L570 Actinic keratosis: Secondary | ICD-10-CM | POA: Diagnosis not present

## 2019-06-27 DIAGNOSIS — E559 Vitamin D deficiency, unspecified: Secondary | ICD-10-CM | POA: Insufficient documentation

## 2019-06-27 DIAGNOSIS — L820 Inflamed seborrheic keratosis: Secondary | ICD-10-CM | POA: Diagnosis not present

## 2019-06-27 DIAGNOSIS — C4401 Basal cell carcinoma of skin of lip: Secondary | ICD-10-CM | POA: Diagnosis not present

## 2019-06-27 DIAGNOSIS — D485 Neoplasm of uncertain behavior of skin: Secondary | ICD-10-CM | POA: Diagnosis not present

## 2019-06-27 NOTE — Assessment & Plan Note (Signed)
On Amlodipine 

## 2019-06-27 NOTE — Assessment & Plan Note (Signed)
S/p PAE (prostate artery embolization - doing well

## 2019-06-27 NOTE — Assessment & Plan Note (Signed)
Vit D 

## 2019-06-27 NOTE — Assessment & Plan Note (Signed)
Labs

## 2019-06-27 NOTE — Assessment & Plan Note (Signed)
On Coumadin 

## 2019-06-27 NOTE — Assessment & Plan Note (Signed)
Meloxicam rare Risk of bleeding - pt is aware

## 2019-06-28 ENCOUNTER — Other Ambulatory Visit: Payer: Self-pay

## 2019-06-28 ENCOUNTER — Other Ambulatory Visit (INDEPENDENT_AMBULATORY_CARE_PROVIDER_SITE_OTHER): Payer: Medicare Other

## 2019-06-28 DIAGNOSIS — E785 Hyperlipidemia, unspecified: Secondary | ICD-10-CM

## 2019-06-28 DIAGNOSIS — R972 Elevated prostate specific antigen [PSA]: Secondary | ICD-10-CM

## 2019-06-28 DIAGNOSIS — R202 Paresthesia of skin: Secondary | ICD-10-CM

## 2019-06-28 DIAGNOSIS — E559 Vitamin D deficiency, unspecified: Secondary | ICD-10-CM

## 2019-06-28 LAB — CBC WITH DIFFERENTIAL/PLATELET
Basophils Absolute: 0 10*3/uL (ref 0.0–0.1)
Basophils Relative: 0.4 % (ref 0.0–3.0)
Eosinophils Absolute: 0 10*3/uL (ref 0.0–0.7)
Eosinophils Relative: 1.6 % (ref 0.0–5.0)
HCT: 39 % (ref 39.0–52.0)
Hemoglobin: 13.7 g/dL (ref 13.0–17.0)
Lymphocytes Relative: 16 % (ref 12.0–46.0)
Lymphs Abs: 0.5 10*3/uL — ABNORMAL LOW (ref 0.7–4.0)
MCHC: 35.2 g/dL (ref 30.0–36.0)
MCV: 101.6 fl — ABNORMAL HIGH (ref 78.0–100.0)
Monocytes Absolute: 0.2 10*3/uL (ref 0.1–1.0)
Monocytes Relative: 8.2 % (ref 3.0–12.0)
Neutro Abs: 2.2 10*3/uL (ref 1.4–7.7)
Neutrophils Relative %: 73.8 % (ref 43.0–77.0)
Platelets: 67 10*3/uL — ABNORMAL LOW (ref 150.0–400.0)
RBC: 3.84 Mil/uL — ABNORMAL LOW (ref 4.22–5.81)
RDW: 14.3 % (ref 11.5–15.5)
WBC: 2.9 10*3/uL — ABNORMAL LOW (ref 4.0–10.5)

## 2019-06-28 LAB — URINALYSIS
Bilirubin Urine: NEGATIVE
Ketones, ur: NEGATIVE
Leukocytes,Ua: NEGATIVE
Nitrite: NEGATIVE
Specific Gravity, Urine: 1.025 (ref 1.000–1.030)
Total Protein, Urine: NEGATIVE
Urine Glucose: NEGATIVE
Urobilinogen, UA: 0.2 (ref 0.0–1.0)
pH: 5.5 (ref 5.0–8.0)

## 2019-06-28 LAB — TSH: TSH: 0.97 u[IU]/mL (ref 0.35–4.50)

## 2019-06-28 LAB — HEPATIC FUNCTION PANEL
ALT: 22 U/L (ref 0–53)
AST: 20 U/L (ref 0–37)
Albumin: 4.2 g/dL (ref 3.5–5.2)
Alkaline Phosphatase: 79 U/L (ref 39–117)
Bilirubin, Direct: 0.1 mg/dL (ref 0.0–0.3)
Total Bilirubin: 0.7 mg/dL (ref 0.2–1.2)
Total Protein: 5.9 g/dL — ABNORMAL LOW (ref 6.0–8.3)

## 2019-06-28 LAB — BASIC METABOLIC PANEL
BUN: 14 mg/dL (ref 6–23)
CO2: 30 mEq/L (ref 19–32)
Calcium: 9.1 mg/dL (ref 8.4–10.5)
Chloride: 105 mEq/L (ref 96–112)
Creatinine, Ser: 1.16 mg/dL (ref 0.40–1.50)
GFR: 61.06 mL/min (ref >60.00)
Glucose, Bld: 108 mg/dL — ABNORMAL HIGH (ref 70–99)
Potassium: 4.1 mEq/L (ref 3.5–5.1)
Sodium: 139 mEq/L (ref 135–145)

## 2019-06-28 LAB — LIPID PANEL
Cholesterol: 129 mg/dL (ref 0–200)
HDL: 43.9 mg/dL (ref >39.00)
LDL Cholesterol: 66 mg/dL (ref 0–99)
NonHDL: 84.81
Total CHOL/HDL Ratio: 3
Triglycerides: 96 mg/dL (ref 0.0–149.0)
VLDL: 19.2 mg/dL (ref 0.0–40.0)

## 2019-06-28 LAB — VITAMIN D 25 HYDROXY (VIT D DEFICIENCY, FRACTURES): VITD: 103.96 ng/mL (ref 30.00–100.00)

## 2019-06-28 LAB — PSA: PSA: 1.66 ng/mL (ref 0.10–4.00)

## 2019-06-28 LAB — VITAMIN B12: Vitamin B-12: 490 pg/mL (ref 211–911)

## 2019-06-29 ENCOUNTER — Other Ambulatory Visit: Payer: Self-pay

## 2019-06-29 DIAGNOSIS — N138 Other obstructive and reflux uropathy: Secondary | ICD-10-CM

## 2019-06-29 DIAGNOSIS — N401 Enlarged prostate with lower urinary tract symptoms: Secondary | ICD-10-CM

## 2019-06-29 DIAGNOSIS — N529 Male erectile dysfunction, unspecified: Secondary | ICD-10-CM

## 2019-06-29 MED ORDER — TADALAFIL 5 MG PO TABS: 5.0000 mg | ORAL_TABLET | Freq: Every day | ORAL | 1 refills | Status: DC

## 2019-06-29 NOTE — Telephone Encounter (Signed)
RX sent.       Dr Diamantina [place]:  Would you please renew my prescription for taladafil but return the amount to 30  of the 5mg  pills per. Right now I am getting only 15 pills and it is a real pain to have to run to the pharmacy every two weeks. [facility] is [facility] in [place]. the daily dose works much better for me.   Thank you, Patrick [name]

## 2019-06-30 ENCOUNTER — Other Ambulatory Visit: Payer: Self-pay | Admitting: Internal Medicine

## 2019-06-30 DIAGNOSIS — D696 Thrombocytopenia, unspecified: Secondary | ICD-10-CM

## 2019-06-30 DIAGNOSIS — R739 Hyperglycemia, unspecified: Secondary | ICD-10-CM

## 2019-06-30 DIAGNOSIS — E559 Vitamin D deficiency, unspecified: Secondary | ICD-10-CM

## 2019-07-04 ENCOUNTER — Ambulatory Visit (INDEPENDENT_AMBULATORY_CARE_PROVIDER_SITE_OTHER): Payer: Medicare Other | Admitting: *Deleted

## 2019-07-04 ENCOUNTER — Other Ambulatory Visit: Payer: Self-pay

## 2019-07-04 DIAGNOSIS — I359 Nonrheumatic aortic valve disorder, unspecified: Secondary | ICD-10-CM | POA: Diagnosis not present

## 2019-07-04 DIAGNOSIS — I712 Thoracic aortic aneurysm, without rupture, unspecified: Secondary | ICD-10-CM

## 2019-07-04 DIAGNOSIS — Z5181 Encounter for therapeutic drug level monitoring: Secondary | ICD-10-CM

## 2019-07-04 LAB — POCT INR: INR: 2.7 (ref 2.0–3.0)

## 2019-07-04 NOTE — Patient Instructions (Signed)
Description   Continue taking 1 tablet daily. Recheck INR in 5 weeks. Call with any questions or new medications #[phone]. Started [place] in January 2021.

## 2019-07-05 DIAGNOSIS — Z20822 Contact with and (suspected) exposure to covid-19: Secondary | ICD-10-CM | POA: Diagnosis not present

## 2019-07-06 ENCOUNTER — Other Ambulatory Visit: Payer: Self-pay

## 2019-07-06 NOTE — Telephone Encounter (Signed)
1.Medication Requested: amoxicillin (AMOXIL) 500 MG tablet  SUMAtriptan (IMITREX) 50 MG tablet   2. Pharmacy ([place], [place], [place]):[facility], [place]  3. On Med List: Yes   4. Last Visit with PCP: 4.29.2021   5. Next visit date with PCP: N/A   Agent: Please be advised that RX refills may take up to 3 business days. We ask that you follow-up with your pharmacy.

## 2019-07-07 ENCOUNTER — Telehealth: Payer: Self-pay | Admitting: Internal Medicine

## 2019-07-07 MED ORDER — SUMATRIPTAN SUCCINATE 50 MG PO TABS: ORAL_TABLET | ORAL | 3 refills | Status: DC

## 2019-07-07 MED ORDER — AMOXICILLIN 500 MG PO TABS: ORAL_TABLET | ORAL | 1 refills | Status: DC

## 2019-07-07 MED ORDER — SUMATRIPTAN SUCCINATE 25 MG PO TABS: 25.0000 mg | ORAL_TABLET | ORAL | 5 refills | Status: DC | PRN

## 2019-07-07 NOTE — Telephone Encounter (Signed)
Please advise,  Do you want to send in the 25mg ?

## 2019-07-07 NOTE — Telephone Encounter (Signed)
    Pt c/o medication issue:  1. Name of Medication: SUMAtriptan (IMITREX) 50 MG tablet  2. How are you currently taking this medication (dosage and times per day)? As written  3. Are you having a reaction (difficulty breathing--STAT)? no  4. What is your medication issue? Pharmacy calling, states medication can not be split

## 2019-07-07 NOTE — Telephone Encounter (Signed)
Ok Thx 

## 2019-07-28 ENCOUNTER — Encounter: Payer: Self-pay | Admitting: Internal Medicine

## 2019-07-28 ENCOUNTER — Telehealth (INDEPENDENT_AMBULATORY_CARE_PROVIDER_SITE_OTHER): Payer: Medicare Other | Admitting: Internal Medicine

## 2019-07-28 ENCOUNTER — Telehealth: Payer: Self-pay | Admitting: Internal Medicine

## 2019-07-28 DIAGNOSIS — K219 Gastro-esophageal reflux disease without esophagitis: Secondary | ICD-10-CM

## 2019-07-28 DIAGNOSIS — R05 Cough: Secondary | ICD-10-CM

## 2019-07-28 DIAGNOSIS — R059 Cough, unspecified: Secondary | ICD-10-CM

## 2019-07-28 DIAGNOSIS — J40 Bronchitis, not specified as acute or chronic: Secondary | ICD-10-CM | POA: Diagnosis not present

## 2019-07-28 MED ORDER — TUSSICAPS 10-8 MG PO CP12: 1.0000 | ORAL_CAPSULE | Freq: Two times a day (BID) | ORAL | 0 refills | Status: DC | PRN

## 2019-07-28 MED ORDER — METHYLPREDNISOLONE 4 MG PO TBPK
ORAL_TABLET | ORAL | 0 refills | Status: DC
Start: 2019-07-28 — End: 2019-08-08

## 2019-07-28 MED ORDER — BENZONATATE 200 MG PO CAPS: 200.0000 mg | ORAL_CAPSULE | Freq: Three times a day (TID) | ORAL | 1 refills | Status: DC | PRN

## 2019-07-28 NOTE — Telephone Encounter (Signed)
Patrick [name]  I cannot find a [facility] on [address] -  I sent it to [facility] on [address] that  [name] has on file.  If capsules are not available we can send a prescription for Tussionex.  Thanks

## 2019-07-28 NOTE — Assessment & Plan Note (Signed)
Seasonal cough.  Severe.  Take Zyrtec.  Medrol Dosepak.  TussiCaps as needed [employer] as needed Call if not better. [name] had a chest CT in 2020.  He is due another chest CT angiogram.

## 2019-07-28 NOTE — Assessment & Plan Note (Signed)
-

## 2019-07-28 NOTE — Assessment & Plan Note (Signed)
Plan-see subacute bronchitis TussiCaps, Tessalon

## 2019-07-28 NOTE — Telephone Encounter (Signed)
New message:   Patrick [name] is calling from [facility] and states that they are currently out of Hydrocod Polst-Chlorphen Polst (TUSSICAPS) 10-8 MG CP12 that was just prescribed for the pt today. He states he called to some other [facility] pharmacies and they don't have any either. He also states he tried to reach the pt but was unable to reach him Please advise.

## 2019-07-28 NOTE — Progress Notes (Signed)
Virtual Visit via Video Note  I connected with Patrick [name] on 07/28/19 at  8:10 AM EDT by a video enabled telemedicine application and verified that I am speaking with the correct person using two identifiers.   I discussed the limitations of evaluation and management by telemedicine and the availability of in person appointments. The patient expressed understanding and agreed to proceed.  I was located at our [facility] office. The patient was at home. There was no one else present in the visit.   History of Present Illness: We need to follow-up on a severe cough, 1 month duration.  The cough is dry.  It is triggered by talking or any other activity.  Patrick [name] had it for several years seasonally at the same time in May-June.  TussiCaps helped him the best in the past  There has been no runny nose, cough, chest pain, shortness of breath, abdominal pain, skin rashes.  No wheezing   Observations/Objective: The patient appears to be in no acute distress, looks well.  Patrick [name] has a dry hacking cough that interferes in our conversation  Assessment and Plan:  See my Assessment and Plan. Follow Up Instructions:    I discussed the assessment and treatment plan with the patient. The patient was provided an opportunity to ask questions and all were answered. The patient agreed with the plan and demonstrated an understanding of the instructions.   The patient was advised to call back or seek an in-person evaluation if the symptoms worsen or if the condition fails to improve as anticipated.  I provided face-to-face time during this encounter. We were at different locations.   Walker Kehr, MD

## 2019-07-28 NOTE — Telephone Encounter (Signed)
I called pt and informed him of below.  He would like for [place] to [facility] the [facility] [address] and see if they can get the medication.

## 2019-07-29 ENCOUNTER — Other Ambulatory Visit: Payer: Self-pay

## 2019-07-29 NOTE — Telephone Encounter (Signed)
Called pt there was no answer LMOM w/MD response../l,mb

## 2019-08-03 ENCOUNTER — Encounter: Payer: Self-pay | Admitting: Gastroenterology

## 2019-08-04 DIAGNOSIS — C4401 Basal cell carcinoma of skin of lip: Secondary | ICD-10-CM | POA: Diagnosis not present

## 2019-08-04 DIAGNOSIS — Z85828 Personal history of other malignant neoplasm of skin: Secondary | ICD-10-CM | POA: Diagnosis not present

## 2019-08-08 ENCOUNTER — Ambulatory Visit (INDEPENDENT_AMBULATORY_CARE_PROVIDER_SITE_OTHER): Payer: Medicare Other | Admitting: *Deleted

## 2019-08-08 ENCOUNTER — Other Ambulatory Visit: Payer: Self-pay

## 2019-08-08 DIAGNOSIS — I712 Thoracic aortic aneurysm, without rupture, unspecified: Secondary | ICD-10-CM

## 2019-08-08 DIAGNOSIS — I359 Nonrheumatic aortic valve disorder, unspecified: Secondary | ICD-10-CM

## 2019-08-08 DIAGNOSIS — Z5181 Encounter for therapeutic drug level monitoring: Secondary | ICD-10-CM

## 2019-08-08 LAB — POCT INR: INR: 3.1 — AB (ref 2.0–3.0)

## 2019-08-08 NOTE — Patient Instructions (Signed)
Description   Take 1/2 a tablet today and then continue taking 1 tablet daily. Recheck INR in 5 weeks. Call with any questions or new medications #[phone]. Started [place] in January 2021.

## 2019-08-09 ENCOUNTER — Other Ambulatory Visit: Payer: Self-pay | Admitting: Internal Medicine

## 2019-08-09 MED ORDER — HYDROCODONE-HOMATROPINE 5-1.5 MG/5ML PO SYRP: 5.0000 mL | ORAL_SOLUTION | Freq: Four times a day (QID) | ORAL | 0 refills | Status: DC | PRN

## 2019-08-09 MED ORDER — METHYLPREDNISOLONE 4 MG PO TBPK: ORAL_TABLET | ORAL | 0 refills | Status: DC

## 2019-08-09 MED ORDER — BENZONATATE 200 MG PO CAPS: 200.0000 mg | ORAL_CAPSULE | Freq: Three times a day (TID) | ORAL | 1 refills | Status: DC | PRN

## 2019-08-09 NOTE — Telephone Encounter (Signed)
New message:    Pt is calling and states he sent a MyChart message in reference to a cough he had a few weeks back. Pt states Dr. Camila Li prescribed a medication and the pt states as soon as he finished the medication the cough came back. Please advise.

## 2019-08-18 ENCOUNTER — Ambulatory Visit (INDEPENDENT_AMBULATORY_CARE_PROVIDER_SITE_OTHER): Payer: Medicare Other | Admitting: Urology

## 2019-08-18 ENCOUNTER — Encounter: Payer: Self-pay | Admitting: Internal Medicine

## 2019-08-18 ENCOUNTER — Ambulatory Visit (INDEPENDENT_AMBULATORY_CARE_PROVIDER_SITE_OTHER)
Admission: RE | Admit: 2019-08-18 | Discharge: 2019-08-18 | Disposition: A | Discharge status: Home / Self Care | Payer: Medicare Other | Source: Ambulatory Visit | Attending: Internal Medicine | Admitting: Internal Medicine

## 2019-08-18 ENCOUNTER — Other Ambulatory Visit: Payer: Self-pay

## 2019-08-18 ENCOUNTER — Ambulatory Visit (INDEPENDENT_AMBULATORY_CARE_PROVIDER_SITE_OTHER): Payer: Medicare Other | Admitting: Internal Medicine

## 2019-08-18 ENCOUNTER — Encounter: Payer: Self-pay | Admitting: Urology

## 2019-08-18 VITALS — BP 144/86 | HR 70 | Ht 72.0 in | Wt 168.0 lb

## 2019-08-18 VITALS — BP 118/76 | HR 71 | Temp 99.1°F | Ht 72.0 in | Wt 171.0 lb

## 2019-08-18 DIAGNOSIS — N529 Male erectile dysfunction, unspecified: Secondary | ICD-10-CM

## 2019-08-18 DIAGNOSIS — N138 Other obstructive and reflux uropathy: Secondary | ICD-10-CM | POA: Diagnosis not present

## 2019-08-18 DIAGNOSIS — R05 Cough: Secondary | ICD-10-CM | POA: Diagnosis not present

## 2019-08-18 DIAGNOSIS — R059 Cough, unspecified: Secondary | ICD-10-CM

## 2019-08-18 DIAGNOSIS — N401 Enlarged prostate with lower urinary tract symptoms: Secondary | ICD-10-CM | POA: Diagnosis not present

## 2019-08-18 LAB — BLADDER SCAN AMB NON-IMAGING: Scan Result: 38

## 2019-08-18 MED ORDER — MONTELUKAST SODIUM 10 MG PO TABS: 10.0000 mg | ORAL_TABLET | Freq: Every day | ORAL | 3 refills | Status: DC

## 2019-08-18 MED ORDER — TADALAFIL 5 MG PO TABS: 5.0000 mg | ORAL_TABLET | Freq: Every day | ORAL | 6 refills | Status: DC

## 2019-08-18 NOTE — Patient Instructions (Signed)
We will check the chest x-ray and have sent in the singulair to take 1 pill daily to help stop the drainage which is causing the cough.

## 2019-08-18 NOTE — Progress Notes (Signed)
° °  08/18/2019 1:53 PM   [name] 1940-10-16 [id number]  Reason for visit: Follow up BPH, ED  HPI: I saw Mr. Raisanen in urology clinic for follow-up of the above issues.  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  He reports he is been doing very well from a urinary perspective since this procedure and is voiding well with a strong stream and not having any nocturia.  He denies any urinary complaints today.  PVR today is normal at 38 mL.  He does have erectile dysfunction since his procedure at [facility], and gets moderate results with Cialis 5 mg daily.  He has tried increasing the dose or taking on demand and did not notice a significant difference.  We discussed other alternatives for ED including injections or vacuum device, and he is not interested in further treatments at this time.  Cialis refilled, risks and benefits discussed RTC 1 year with P[facility]  Billey Co, MD  [facility] [address] [place], [place] [address] [phone]

## 2019-08-19 LAB — MICROSCOPIC EXAMINATION
Bacteria, UA: NONE SEEN
Epithelial Cells (non renal): NONE SEEN /hpf (ref 0–10)

## 2019-08-19 LAB — URINALYSIS, COMPLETE
Bilirubin, UA: NEGATIVE
Glucose, UA: NEGATIVE
Ketones, UA: NEGATIVE
Leukocytes,UA: NEGATIVE
Nitrite, UA: NEGATIVE
Protein,UA: NEGATIVE
RBC, UA: NEGATIVE
Specific Gravity, UA: 1.02 (ref 1.005–1.030)
Urobilinogen, Ur: 0.2 mg/dL (ref 0.2–1.0)
pH, UA: 6 (ref 5.0–7.5)

## 2019-08-19 NOTE — Assessment & Plan Note (Signed)
Has known GERD which he states well controlled and taking protonix 40 mg daily in morning. Continue. On exam he has redness to oropharynx and likely post nasal drip is the cause of his upper airway cough syndrome. CXR today to rule out subacute pneumonia. Add singulair to zyrtec and continue both until cough gone. We talked about avoiding sensation to clear throat and cough as often as possible. Talked about feedback loop for coughing. Would avoid further steroids or antibiotics without evidence for problems on CXR.

## 2019-08-19 NOTE — Progress Notes (Signed)
   Subjective:   Patient ID: Patrick [name], male    DOB: 08/13/1940, 79 y.o.   MRN: [id number]  HPI The patient is a 79 YO man coming in for concerns about cough. Started about 3 months ago. Denies fevers or chills. Mostly non-productive. Rare clear sputum maybe once a day or less. He is a former smoker but quit >40 years ago. Does get similar symptoms with cough most spring time. Is taking zyrtec daily at suggestion of PCP and thinks this is helping some to reduce cough. Overall it is improving gradually. He states that prior dose packs of prednisone eliminated the cough but it "returned with a vengence". Just finished a steroid pack less than a week ago. Using tessalon perles and hycodan cough syrup currently which do help reduce frequency and severity with coughing. Mostly talking seems to cause cough. Walking or exercising he is able to do without coughing. Not waking him up at night time much. Denies change in diet. No recent courses of antibiotics. He has been fully vaccinated against covid-19 for several months now.   Review of Systems  Constitutional: Negative.   HENT: Negative.   Eyes: Negative.   Respiratory: Positive for cough. Negative for chest tightness and shortness of breath.   Cardiovascular: Negative for chest pain, palpitations and leg swelling.  Gastrointestinal: Negative for abdominal distention, abdominal pain, constipation, diarrhea, nausea and vomiting.  Musculoskeletal: Negative.   Skin: Negative.   Neurological: Negative.   Psychiatric/Behavioral: Negative.     Objective:  Physical Exam Constitutional:      Appearance: He is well-developed.  HENT:     Head: Normocephalic and atraumatic.     Ears:     Comments: Oropharynx with redness and clear drainage Cardiovascular:     Rate and Rhythm: Normal rate and regular rhythm.  Pulmonary:     Effort: Pulmonary effort is normal. No respiratory distress.     Breath sounds: Normal breath sounds. No wheezing or rales.    Abdominal:     General: Bowel sounds are normal. There is no distension.     Palpations: Abdomen is soft.     Tenderness: There is no abdominal tenderness. There is no rebound.  Musculoskeletal:     Cervical back: Normal range of motion.  Skin:    General: Skin is warm and dry.  Neurological:     Mental Status: He is alert and oriented to person, place, and time.     Coordination: Coordination normal.     Vitals:   08/18/19 1445  BP: 118/76  Pulse: 71  Temp: 99.1 F (37.3 C)  TempSrc: Oral  SpO2: 95%  Weight: 171 lb (77.6 kg)  Height: 6' (1.829 m)    This visit occurred during the SARS-CoV-2 public health emergency.  Safety protocols were in place, including screening questions prior to the visit, additional usage of staff PPE, and extensive cleaning of exam room while observing appropriate contact time as indicated for disinfecting solutions.   Assessment & Plan:  Visit time 25 minutes in face to face communication with patient and coordination of care, additional 5 minutes spent in record review, coordination or care, ordering tests, communicating/referring to other healthcare professionals, documenting in medical records all on the same day of the visit for total time 30 minutes spent on the visit.

## 2019-09-09 ENCOUNTER — Other Ambulatory Visit: Payer: Self-pay | Admitting: Cardiovascular Disease

## 2019-09-12 ENCOUNTER — Ambulatory Visit (INDEPENDENT_AMBULATORY_CARE_PROVIDER_SITE_OTHER): Payer: Medicare Other

## 2019-09-12 ENCOUNTER — Other Ambulatory Visit: Payer: Self-pay

## 2019-09-12 DIAGNOSIS — I712 Thoracic aortic aneurysm, without rupture, unspecified: Secondary | ICD-10-CM

## 2019-09-12 DIAGNOSIS — Z5181 Encounter for therapeutic drug level monitoring: Secondary | ICD-10-CM | POA: Diagnosis not present

## 2019-09-12 DIAGNOSIS — I359 Nonrheumatic aortic valve disorder, unspecified: Secondary | ICD-10-CM | POA: Diagnosis not present

## 2019-09-12 LAB — POCT INR: INR: 1.4 — AB (ref 2.0–3.0)

## 2019-09-12 NOTE — Patient Instructions (Signed)
Take 1.5 tablets today and tomorrow and then continue taking 1 tablet daily. Recheck INR in 2 weeks. Call with any questions or new medications #[phone]. Started [place] in January 2021.

## 2019-09-26 ENCOUNTER — Other Ambulatory Visit: Payer: Self-pay

## 2019-09-26 ENCOUNTER — Ambulatory Visit (INDEPENDENT_AMBULATORY_CARE_PROVIDER_SITE_OTHER): Payer: Medicare Other | Admitting: *Deleted

## 2019-09-26 DIAGNOSIS — I712 Thoracic aortic aneurysm, without rupture, unspecified: Secondary | ICD-10-CM

## 2019-09-26 DIAGNOSIS — Z5181 Encounter for therapeutic drug level monitoring: Secondary | ICD-10-CM

## 2019-09-26 DIAGNOSIS — I359 Nonrheumatic aortic valve disorder, unspecified: Secondary | ICD-10-CM

## 2019-09-26 LAB — POCT INR: INR: 2.7 (ref 2.0–3.0)

## 2019-09-26 NOTE — Patient Instructions (Signed)
Description   Continue taking 1 tablet daily. Recheck INR in 4 weeks. Call with any questions or new medications #[phone]. Started [place] in January 2021.

## 2019-10-07 DIAGNOSIS — Z20822 Contact with and (suspected) exposure to covid-19: Secondary | ICD-10-CM | POA: Diagnosis not present

## 2019-10-23 ENCOUNTER — Other Ambulatory Visit: Payer: Self-pay | Admitting: Internal Medicine

## 2019-10-23 MED ORDER — METRONIDAZOLE 500 MG PO TABS: 500.0000 mg | ORAL_TABLET | Freq: Three times a day (TID) | ORAL | 0 refills | Status: DC

## 2019-10-24 ENCOUNTER — Other Ambulatory Visit: Payer: Self-pay

## 2019-10-24 ENCOUNTER — Ambulatory Visit (INDEPENDENT_AMBULATORY_CARE_PROVIDER_SITE_OTHER): Payer: Medicare Other | Admitting: *Deleted

## 2019-10-24 DIAGNOSIS — I359 Nonrheumatic aortic valve disorder, unspecified: Secondary | ICD-10-CM

## 2019-10-24 DIAGNOSIS — Z5181 Encounter for therapeutic drug level monitoring: Secondary | ICD-10-CM | POA: Diagnosis not present

## 2019-10-24 DIAGNOSIS — I712 Thoracic aortic aneurysm, without rupture, unspecified: Secondary | ICD-10-CM

## 2019-10-24 LAB — POCT INR: INR: 3.9 — AB (ref 2.0–3.0)

## 2019-10-24 NOTE — Patient Instructions (Signed)
Description   Do not take any [place] today then continue taking 1 tablet daily. Recheck INR in 3 weeks. Call with any questions or new medications #[phone]. Started [place] in January 2021.

## 2019-11-01 ENCOUNTER — Other Ambulatory Visit: Payer: Self-pay

## 2019-11-01 ENCOUNTER — Inpatient Hospital Stay: Payer: Medicare Other | Attending: Hematology & Oncology

## 2019-11-01 ENCOUNTER — Encounter: Payer: Self-pay | Admitting: Hematology & Oncology

## 2019-11-01 ENCOUNTER — Telehealth: Payer: Self-pay | Admitting: Hematology & Oncology

## 2019-11-01 ENCOUNTER — Inpatient Hospital Stay (HOSPITAL_BASED_OUTPATIENT_CLINIC_OR_DEPARTMENT_OTHER): Payer: Medicare Other | Admitting: Hematology & Oncology

## 2019-11-01 VITALS — BP 146/81 | HR 50 | Temp 97.5°F | Resp 20 | Wt 170.1 lb

## 2019-11-01 DIAGNOSIS — H1131 Conjunctival hemorrhage, right eye: Secondary | ICD-10-CM | POA: Insufficient documentation

## 2019-11-01 DIAGNOSIS — D696 Thrombocytopenia, unspecified: Secondary | ICD-10-CM

## 2019-11-01 DIAGNOSIS — D72819 Decreased white blood cell count, unspecified: Secondary | ICD-10-CM | POA: Diagnosis not present

## 2019-11-01 DIAGNOSIS — Z7901 Long term (current) use of anticoagulants: Secondary | ICD-10-CM | POA: Insufficient documentation

## 2019-11-01 LAB — CMP (CANCER CENTER ONLY)
ALT: 23 U/L (ref 0–44)
AST: 21 U/L (ref 15–41)
Albumin: 4.4 g/dL (ref 3.5–5.0)
Alkaline Phosphatase: 78 U/L (ref 38–126)
Anion gap: 4 — ABNORMAL LOW (ref 5–15)
BUN: 18 mg/dL (ref 8–23)
CO2: 32 mmol/L (ref 22–32)
Calcium: 10.4 mg/dL — ABNORMAL HIGH (ref 8.9–10.3)
Chloride: 105 mmol/L (ref 98–111)
Creatinine: 1.17 mg/dL (ref 0.61–1.24)
GFR, Est AFR Am: >60 mL/min (ref >60)
GFR, Estimated: 60 mL/min — ABNORMAL LOW (ref >60)
Glucose, Bld: 98 mg/dL (ref 70–99)
Potassium: 4.7 mmol/L (ref 3.5–5.1)
Sodium: 141 mmol/L (ref 135–145)
Total Bilirubin: 0.7 mg/dL (ref 0.3–1.2)
Total Protein: 6.4 g/dL — ABNORMAL LOW (ref 6.5–8.1)

## 2019-11-01 LAB — CBC WITH DIFFERENTIAL (CANCER CENTER ONLY)
Abs Immature Granulocytes: 0.01 10*3/uL (ref 0.00–0.07)
Basophils Absolute: 0 10*3/uL (ref 0.0–0.1)
Basophils Relative: 0 %
Eosinophils Absolute: 0 10*3/uL (ref 0.0–0.5)
Eosinophils Relative: 1 %
HCT: 43.1 % (ref 39.0–52.0)
Hemoglobin: 14.9 g/dL (ref 13.0–17.0)
Immature Granulocytes: 0 %
Lymphocytes Relative: 15 %
Lymphs Abs: 0.4 10*3/uL — ABNORMAL LOW (ref 0.7–4.0)
MCH: 34.7 pg — ABNORMAL HIGH (ref 26.0–34.0)
MCHC: 34.6 g/dL (ref 30.0–36.0)
MCV: 100.5 fL — ABNORMAL HIGH (ref 80.0–100.0)
Monocytes Absolute: 0.2 10*3/uL (ref 0.1–1.0)
Monocytes Relative: 8 %
Neutro Abs: 2.1 10*3/uL (ref 1.7–7.7)
Neutrophils Relative %: 76 %
Platelet Count: 65 10*3/uL — ABNORMAL LOW (ref 150–400)
RBC: 4.29 MIL/uL (ref 4.22–5.81)
RDW: 14.4 % (ref 11.5–15.5)
WBC Count: 2.8 10*3/uL — ABNORMAL LOW (ref 4.0–10.5)
nRBC: 0 % (ref 0.0–0.2)

## 2019-11-01 LAB — SAVE SMEAR(SSMR), FOR PROVIDER SLIDE REVIEW

## 2019-11-01 NOTE — Progress Notes (Signed)
Hematology and Oncology Follow Up Visit  Patrick [name] [id number] October 16, 1940 79 y.o. 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  As always, he has been traveling.  He goes across the world.  He is in the [employer].  Unfortunately, he tells me that in [place] things are really shutting down because of the coronavirus.  He likes to go down to [place].  I think he is headed back down there again.  He has been very safe.  He has been avoiding the coronavirus.  He has had no problems with bleeding or bruising.  He is on Coumadin.  He does have little bit of hemorrhage that is subconjunctival in the right eye.  He has had this for a while.  He has had no problem with rashes.  There is been no fever.  He is no cough or shortness of breath.  There is been no change in bowel or bladder habits.  Overall, I was his performance status is ECOG 0.    Medications:  Current Outpatient Medications:  .  amLODipine (NORVASC) 5 MG tablet, Take 1 tablet (5 mg total) by mouth daily., Disp: 90 tablet, Rfl: 3 .  amoxicillin (AMOXIL) 500 MG tablet, Take 4 tablets by mouth one hour prior to dental appointments, Disp: 20 tablet, Rfl: 1 .  atorvastatin (LIPITOR) 10 MG tablet, TAKE ONE TABLET BY MOUTH DAILY, Disp: 90 tablet, Rfl: 3 .  desonide (DESOWEN) 0.05 % cream, Apply 1 application topically daily as needed., Disp: 60 g, Rfl: 3 .  diphenoxylate-atropine (LOMOTIL) 2.5-0.025 MG tablet, Take 1 tablet by mouth 4 (four) times daily as needed for diarrhea or loose stools., Disp: 60 tablet, Rfl: 1 .  JANTOVEN 7.5 MG tablet, TAKE ONE TABLET BY MOUTH ONE TIME DAILY, Disp: 100 tablet, Rfl: 0 .  metroNIDAZOLE (FLAGYL) 500 MG tablet, Take 1 tablet (500 mg total) by mouth 3 (three) times daily., Disp: 30 tablet, Rfl: 0 .  pantoprazole (PROTONIX) 40 MG tablet, TAKE ONE TABLET BY MOUTH  DAILY, Disp: 90 tablet, Rfl: 3 .  SUMAtriptan (IMITREX) 25 MG tablet, Take 1 tablet (25 mg total) by mouth every 2 (two) hours as needed for migraine. May repeat in 2 hours if headache persists or recurs., Disp: 12 tablet, Rfl: 5 .  tadalafil (CIALIS) 5 MG tablet, Take 1 tablet (5 mg total) by mouth daily., Disp: 30 tablet, Rfl: 6 .  triamcinolone cream (KENALOG) 0.1 %, Apply 1 application topically as needed. , Disp: , Rfl:  .  HYDROcodone-homatropine (HYCODAN) 5-1.5 MG/5ML syrup, Take 5 mLs by mouth every 6 (six) hours as needed for cough., Disp: 240 mL, Rfl: 0 .  montelukast (SINGULAIR) 10 MG tablet, Take 1 tablet (10 mg total) by mouth at bedtime., Disp: 30 tablet, Rfl: 3  Allergies:  Allergies  Allergen Reactions  . Codeine Rash  . Terbinafine Itching    Past Medical History, Surgical history, Social history, and Family History were reviewed and updated.  Review of Systems: Review of Systems  Constitutional: Negative.   HENT:  Negative.   Eyes: Negative.   Respiratory: Negative.   Cardiovascular: Negative.   Gastrointestinal: Negative.   Endocrine: Negative.   Genitourinary: Negative.    Musculoskeletal: Negative.   Skin: Negative.   Neurological: Negative.   Hematological: Negative.   Psychiatric/Behavioral: Negative.     Physical Exam:  weight is 170 lb 1.9 oz (77.2 kg). His oral temperature is 97.5 F (36.4 C) (abnormal). His blood pressure is 146/81 (abnormal) and his pulse is 50 (abnormal). His respiration is 20 and oxygen saturation is 100%.   Wt Readings from Last 3 Encounters:  11/01/19 170 lb 1.9 oz (77.2 kg)  08/18/19 171 lb (77.6 kg)  08/18/19 168 lb (76.2 kg)    Physical Exam Vitals reviewed.  HENT:     Head: Normocephalic and atraumatic.  Eyes:     Pupils: Pupils are equal, round, and reactive to light.  Cardiovascular:     Rate and Rhythm: Normal rate and regular rhythm.     Heart sounds: Normal heart sounds.     Comments: Cardiac exam shows a  regular rate and rhythm.  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  I hear no murmurs or rubs or bruits. Pulmonary:     Effort: Pulmonary effort is normal.     Breath sounds: Normal breath sounds.  Abdominal:     General: Bowel sounds are normal.     Palpations: Abdomen is soft.  Musculoskeletal:        General: No tenderness or deformity. Normal range of motion.     Cervical back: Normal range of motion.  Lymphadenopathy:     Cervical: No cervical adenopathy.  Skin:    General: Skin is warm and dry.     Findings: No erythema or rash.  Neurological:     Mental Status: He is alert and oriented to person, place, and time.  Psychiatric:        Behavior: Behavior normal.        Thought Content: Thought content normal.        Judgment: Judgment normal.      Lab Results  Component Value Date   WBC 2.8 (L) 11/01/2019   HGB 14.9 11/01/2019   HCT 43.1 11/01/2019   MCV 100.5 (H) 11/01/2019   PLT 65 (L) 11/01/2019     Chemistry      Component Value Date/Time   NA 141 11/01/2019 1131   NA 140 11/18/2018 1000   K 4.7 11/01/2019 1131   CL 105 11/01/2019 1131   CO2 32 11/01/2019 1131   BUN 18 11/01/2019 1131   BUN 13 11/18/2018 1000   CREATININE 1.17 11/01/2019 1131      Component Value Date/Time   CALCIUM 10.4 (H) 11/01/2019 1131   ALKPHOS 78 11/01/2019 1131   AST 21 11/01/2019 1131   ALT 23 11/01/2019 1131   BILITOT 0.7 11/01/2019 1131       Impression and Plan: Patrick [name] is a 79 year old white male.  He has mild leukopenia and thrombocytopenia.  I looked at his blood smear.  I really do not see anything that looked suspicious on the blood smear.  I still think that myelodysplasia is a possibility.  When I see him back, I will have to send a myelodysplasia panel on the peripheral blood.  We still have to think about a bone marrow biopsy.  I think his platelet counts continue to trend downward, then we may have to think about this.  I would  like to get him back between Thanksgiving and Christmas.  I know he is very busy so I do not want to interfere with his work travel.     Volanda Napoleon, MD 9/7/202112:22 PM

## 2019-11-01 NOTE — Telephone Encounter (Signed)
Appointments scheduled calendar printed & mailed per 9/7 los

## 2019-11-02 ENCOUNTER — Encounter: Payer: Self-pay | Admitting: Hematology & Oncology

## 2019-11-02 ENCOUNTER — Telehealth: Payer: Self-pay | Admitting: Cardiovascular Disease

## 2019-11-02 LAB — LACTATE DEHYDROGENASE: LDH: 315 U/L — ABNORMAL HIGH (ref 98–192)

## 2019-11-02 NOTE — Telephone Encounter (Signed)
Patient called to schedule follow up appointment with Dr. Angelena Form. Offered Dr. Camillia Herter soonest appointment available (02/27/20) and patient declined. Due to symptoms patient assumes he made need to be seen sooner. Patient also declined to further discuss symptoms. However, he suggested that Dr. Angelena Form review his patient message with Dr. Marin Olp to determine whether or not patient needs to be seen sooner. Please return call to discuss.

## 2019-11-03 NOTE — Telephone Encounter (Signed)
Agree. No other recs. Patrick [name]

## 2019-11-03 NOTE — Telephone Encounter (Signed)
I spoke with [name].  He would like Dr. Angelena [name] to review notes and labs from this week w Dr. Marin [name].  He said Dr. Marin [name] told him there is something going on w his bone marrow, not sure what at this point.  Patrick [name] he is concerned about the RBCs and LDH results he sees in [place].   I asked him to reach out to Dr. Antonieta [name] office re these concerns.  He is awaiting reply from a MyChart message to him and has follow up scheduled in December.  The patient has no cardiac concerns at this time.  He gets fatigued easily. I adv that I would make Dr. Angelena [name] aware of his concerns and will call him back if there are any new recommendations.  Otherwise I have scheduled his cardiology follow up for Nov 1 as planned (1 year).

## 2019-11-09 ENCOUNTER — Telehealth: Payer: Self-pay | Admitting: Internal Medicine

## 2019-11-09 NOTE — Telephone Encounter (Signed)
   [facility] calling to request refill for pantoprazole (PROTONIX) 40 MG tablet

## 2019-11-10 MED ORDER — PANTOPRAZOLE SODIUM 40 MG PO TBEC
40.0000 mg | DELAYED_RELEASE_TABLET | Freq: Every day | ORAL | 3 refills | Status: DC
Start: 2019-11-10 — End: 2020-08-06

## 2019-11-10 NOTE — Telephone Encounter (Signed)
See 9/16 med refill

## 2019-11-14 ENCOUNTER — Encounter: Payer: Self-pay | Admitting: Gastroenterology

## 2019-11-14 ENCOUNTER — Ambulatory Visit (INDEPENDENT_AMBULATORY_CARE_PROVIDER_SITE_OTHER): Payer: Medicare Other | Admitting: Gastroenterology

## 2019-11-14 ENCOUNTER — Ambulatory Visit (INDEPENDENT_AMBULATORY_CARE_PROVIDER_SITE_OTHER): Payer: Medicare Other | Admitting: *Deleted

## 2019-11-14 ENCOUNTER — Other Ambulatory Visit: Payer: Self-pay

## 2019-11-14 VITALS — BP 119/70 | HR 65 | Ht 72.0 in | Wt 172.2 lb

## 2019-11-14 DIAGNOSIS — I712 Thoracic aortic aneurysm, without rupture, unspecified: Secondary | ICD-10-CM

## 2019-11-14 DIAGNOSIS — I359 Nonrheumatic aortic valve disorder, unspecified: Secondary | ICD-10-CM | POA: Diagnosis not present

## 2019-11-14 DIAGNOSIS — Z5181 Encounter for therapeutic drug level monitoring: Secondary | ICD-10-CM | POA: Diagnosis not present

## 2019-11-14 DIAGNOSIS — Z7901 Long term (current) use of anticoagulants: Secondary | ICD-10-CM | POA: Diagnosis not present

## 2019-11-14 DIAGNOSIS — Z86010 Personal history of colonic polyps: Secondary | ICD-10-CM

## 2019-11-14 DIAGNOSIS — D696 Thrombocytopenia, unspecified: Secondary | ICD-10-CM

## 2019-11-14 DIAGNOSIS — Z1152 Encounter for screening for COVID-19: Secondary | ICD-10-CM | POA: Diagnosis not present

## 2019-11-14 LAB — POCT INR: INR: 2.5 (ref 2.0–3.0)

## 2019-11-14 NOTE — Patient Instructions (Addendum)
Description   Continue taking 1 tablet daily. Recheck INR in 3 weeks. Call with any questions or new medications #[phone]. Started [place] in January 2021.
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
## 2019-11-14 NOTE — Patient Instructions (Signed)
Call back in January 2022 if you decide you want to schedule your Colonoscopy.   Thank you for choosing me and [facility].  Pricilla Riffle. Dagoberto Ligas., MD., [facility] Regal

## 2019-11-16 ENCOUNTER — Encounter: Payer: Self-pay | Admitting: Hematology & Oncology

## 2019-11-21 ENCOUNTER — Encounter: Payer: Self-pay | Admitting: Hematology & Oncology

## 2019-12-02 ENCOUNTER — Other Ambulatory Visit: Payer: Self-pay | Admitting: Internal Medicine

## 2019-12-05 ENCOUNTER — Other Ambulatory Visit: Payer: Self-pay

## 2019-12-05 ENCOUNTER — Ambulatory Visit (INDEPENDENT_AMBULATORY_CARE_PROVIDER_SITE_OTHER): Payer: Medicare Other

## 2019-12-05 DIAGNOSIS — I712 Thoracic aortic aneurysm, without rupture, unspecified: Secondary | ICD-10-CM

## 2019-12-05 DIAGNOSIS — I359 Nonrheumatic aortic valve disorder, unspecified: Secondary | ICD-10-CM | POA: Diagnosis not present

## 2019-12-05 DIAGNOSIS — Z5181 Encounter for therapeutic drug level monitoring: Secondary | ICD-10-CM | POA: Diagnosis not present

## 2019-12-05 LAB — POCT INR: INR: 3.1 — AB (ref 2.0–3.0)

## 2019-12-05 NOTE — Patient Instructions (Signed)
Description   Start taking 1 tablet daily except 1/2 tablet on Mondays. Recheck INR in 4 weeks. Call with any questions or new medications #[phone]. Started [place] in January 2021.

## 2019-12-06 ENCOUNTER — Other Ambulatory Visit: Payer: Self-pay | Admitting: Cardiovascular Disease

## 2019-12-06 NOTE — Telephone Encounter (Signed)
Spoke with Vikki Ports at pharmacy and clarified prescription.  Take 1/2 to 1 tablet by mouth daily or as directed by Coumadin clinic.

## 2019-12-06 NOTE — Telephone Encounter (Signed)
New message:     Cheesy from [facility] has some question concering this patient medication.

## 2019-12-06 NOTE — Telephone Encounter (Signed)
Pt's pharmacy is calling requesting the directions on how pt is supposed to take his warfarin. Pharmacy needs the sig to say how pt is suppose to take medication. Please address

## 2019-12-13 ENCOUNTER — Ambulatory Visit: Payer: Medicare Other

## 2019-12-22 ENCOUNTER — Encounter: Payer: Self-pay | Admitting: Hematology & Oncology

## 2019-12-26 ENCOUNTER — Other Ambulatory Visit: Payer: Self-pay

## 2019-12-26 ENCOUNTER — Ambulatory Visit (INDEPENDENT_AMBULATORY_CARE_PROVIDER_SITE_OTHER): Payer: Medicare Other | Admitting: Cardiovascular Disease

## 2019-12-26 ENCOUNTER — Ambulatory Visit (INDEPENDENT_AMBULATORY_CARE_PROVIDER_SITE_OTHER): Payer: Medicare Other | Admitting: *Deleted

## 2019-12-26 ENCOUNTER — Encounter: Payer: Self-pay | Admitting: Cardiovascular Disease

## 2019-12-26 VITALS — BP 112/70 | HR 61 | Ht 72.0 in | Wt 176.0 lb

## 2019-12-26 DIAGNOSIS — I712 Thoracic aortic aneurysm, without rupture, unspecified: Secondary | ICD-10-CM

## 2019-12-26 DIAGNOSIS — I10 Essential (primary) hypertension: Secondary | ICD-10-CM | POA: Diagnosis not present

## 2019-12-26 DIAGNOSIS — I359 Nonrheumatic aortic valve disorder, unspecified: Secondary | ICD-10-CM | POA: Diagnosis not present

## 2019-12-26 DIAGNOSIS — Z5181 Encounter for therapeutic drug level monitoring: Secondary | ICD-10-CM

## 2019-12-26 LAB — POCT INR: INR: 2.3 (ref 2.0–3.0)

## 2019-12-26 NOTE — Patient Instructions (Signed)
Description   Continue taking 1 tablet daily except 1/2 tablet on Sundays. Recheck INR in 3 weeks. Call with any questions or new medications #[phone]. Started [place] in January 2021.

## 2019-12-26 NOTE — Patient Instructions (Signed)
Medication Instructions:  Your provider recommends that you continue on your current medications as directed. Please refer to the Current Medication list given to you today.   *If you need a refill on your cardiac medications before your next appointment, please call your pharmacy*  Lab Work: You will have labs drawn prior to your CT next September. If you have labs (blood work) drawn today and your tests are completely normal, you will receive your results only by: Marland Kitchen MyChart Message (if you have MyChart) OR . A paper copy in the mail If you have any lab test that is abnormal or we need to change your treatment, we will call you to review the results.  Testing/Procedures: You will have a CT of your chest next September.   Follow-Up: At [facility], you and your health needs are our priority.  As part of our continuing mission to provide you with exceptional heart care, we have created designated Provider Care Teams.  These Care Teams include your primary Cardiologist (physician) and Advanced Practice Providers (APPs -  Physician Assistants and Nurse Practitioners) who all work together to provide you with the care you need, when you need it. Your next appointment:   12 month(s) The format for your next appointment:   In Person Provider:   You may see Lauree Chandler, MD or one of the following Advanced Practice Providers on your designated Care Team:    Melina Copa, PA-C  Ermalinda Barrios, PA-C
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
## 2019-12-27 DIAGNOSIS — Z85828 Personal history of other malignant neoplasm of skin: Secondary | ICD-10-CM | POA: Diagnosis not present

## 2019-12-27 DIAGNOSIS — L821 Other seborrheic keratosis: Secondary | ICD-10-CM | POA: Diagnosis not present

## 2019-12-27 DIAGNOSIS — L812 Freckles: Secondary | ICD-10-CM | POA: Diagnosis not present

## 2019-12-27 DIAGNOSIS — L820 Inflamed seborrheic keratosis: Secondary | ICD-10-CM | POA: Diagnosis not present

## 2019-12-27 DIAGNOSIS — L570 Actinic keratosis: Secondary | ICD-10-CM | POA: Diagnosis not present

## 2020-01-04 ENCOUNTER — Other Ambulatory Visit: Payer: Self-pay | Admitting: *Deleted

## 2020-01-06 ENCOUNTER — Telehealth: Payer: Self-pay | Admitting: Cardiovascular Disease

## 2020-01-06 ENCOUNTER — Other Ambulatory Visit: Payer: Self-pay | Admitting: *Deleted

## 2020-01-06 MED ORDER — AMLODIPINE BESYLATE 5 MG PO TABS
5.0000 mg | ORAL_TABLET | Freq: Every day | ORAL | 3 refills | Status: DC
Start: 2020-01-06 — End: 2020-10-02

## 2020-01-06 NOTE — Telephone Encounter (Signed)
° ° °*  STAT* If patient is at the pharmacy, call can be transferred to refill team.   1. Which medications need to be refilled? (please list name of each medication and dose if known) amLODipine (NORVASC) 5 MG tablet  2. Which pharmacy/location (including street and city if local pharmacy) is medication to be sent to? [facility] [address] [place], [place]. AT [place]  3. Do they need a 30 day or 90 day supply? 90 days   Pt is completely out of medications

## 2020-01-06 NOTE — Telephone Encounter (Signed)
Rx sent in as requested. Confirmation received.

## 2020-01-07 ENCOUNTER — Ambulatory Visit (INDEPENDENT_AMBULATORY_CARE_PROVIDER_SITE_OTHER): Payer: Medicare Other

## 2020-01-07 DIAGNOSIS — Z23 Encounter for immunization: Secondary | ICD-10-CM | POA: Diagnosis not present

## 2020-01-16 ENCOUNTER — Other Ambulatory Visit: Payer: Self-pay

## 2020-01-16 ENCOUNTER — Ambulatory Visit (INDEPENDENT_AMBULATORY_CARE_PROVIDER_SITE_OTHER): Payer: Medicare Other

## 2020-01-16 DIAGNOSIS — I712 Thoracic aortic aneurysm, without rupture, unspecified: Secondary | ICD-10-CM

## 2020-01-16 DIAGNOSIS — Z5181 Encounter for therapeutic drug level monitoring: Secondary | ICD-10-CM | POA: Diagnosis not present

## 2020-01-16 DIAGNOSIS — I359 Nonrheumatic aortic valve disorder, unspecified: Secondary | ICD-10-CM

## 2020-01-16 LAB — POCT INR: INR: 2.3 (ref 2.0–3.0)

## 2020-01-16 NOTE — Patient Instructions (Signed)
Description   Continue on same dosage 1 tablet daily except 1/2 tablet on Sundays. Recheck INR in 4 weeks. Call with any questions or new medications #[phone]. Started [place] in January 2021.      

## 2020-01-18 DIAGNOSIS — Z20828 Contact with and (suspected) exposure to other viral communicable diseases: Secondary | ICD-10-CM | POA: Diagnosis not present

## 2020-01-31 ENCOUNTER — Inpatient Hospital Stay: Payer: Medicare Other | Attending: Hematology & Oncology

## 2020-01-31 ENCOUNTER — Other Ambulatory Visit: Payer: Self-pay

## 2020-01-31 ENCOUNTER — Other Ambulatory Visit: Payer: Self-pay | Admitting: *Deleted

## 2020-01-31 ENCOUNTER — Encounter: Payer: Self-pay | Admitting: Hematology & Oncology

## 2020-01-31 ENCOUNTER — Inpatient Hospital Stay (HOSPITAL_BASED_OUTPATIENT_CLINIC_OR_DEPARTMENT_OTHER): Payer: Medicare Other | Admitting: Hematology & Oncology

## 2020-01-31 VITALS — BP 127/69 | HR 61 | Temp 97.5°F | Resp 18 | Wt 177.8 lb

## 2020-01-31 DIAGNOSIS — D72819 Decreased white blood cell count, unspecified: Secondary | ICD-10-CM | POA: Diagnosis not present

## 2020-01-31 DIAGNOSIS — Z7901 Long term (current) use of anticoagulants: Secondary | ICD-10-CM | POA: Diagnosis not present

## 2020-01-31 DIAGNOSIS — D696 Thrombocytopenia, unspecified: Secondary | ICD-10-CM

## 2020-01-31 LAB — CBC WITH DIFFERENTIAL (CANCER CENTER ONLY)
Abs Immature Granulocytes: 0.01 10*3/uL (ref 0.00–0.07)
Basophils Absolute: 0 10*3/uL (ref 0.0–0.1)
Basophils Relative: 0 %
Eosinophils Absolute: 0 10*3/uL (ref 0.0–0.5)
Eosinophils Relative: 2 %
HCT: 39.3 % (ref 39.0–52.0)
Hemoglobin: 13.6 g/dL (ref 13.0–17.0)
Immature Granulocytes: 0 %
Lymphocytes Relative: 15 %
Lymphs Abs: 0.4 10*3/uL — ABNORMAL LOW (ref 0.7–4.0)
MCH: 34.9 pg — ABNORMAL HIGH (ref 26.0–34.0)
MCHC: 34.6 g/dL (ref 30.0–36.0)
MCV: 100.8 fL — ABNORMAL HIGH (ref 80.0–100.0)
Monocytes Absolute: 0.2 10*3/uL (ref 0.1–1.0)
Monocytes Relative: 9 %
Neutro Abs: 2 10*3/uL (ref 1.7–7.7)
Neutrophils Relative %: 74 %
Platelet Count: 70 10*3/uL — ABNORMAL LOW (ref 150–400)
RBC: 3.9 MIL/uL — ABNORMAL LOW (ref 4.22–5.81)
RDW: 14.2 % (ref 11.5–15.5)
WBC Count: 2.7 10*3/uL — ABNORMAL LOW (ref 4.0–10.5)
nRBC: 0 % (ref 0.0–0.2)

## 2020-01-31 LAB — SAVE SMEAR(SSMR), FOR PROVIDER SLIDE REVIEW

## 2020-01-31 LAB — LACTATE DEHYDROGENASE: LDH: 254 U/L — ABNORMAL HIGH (ref 98–192)

## 2020-01-31 LAB — PLATELET BY CITRATE

## 2020-01-31 NOTE — Progress Notes (Signed)
Hematology and Oncology Follow Up Visit  Patrick [name] [id number] August 26, 1940 79 y.o. 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  Amazingly, he came back last night from [place].  He was down there on business.  He goes down to [place] quite a bit.  He talked a lot about what was going on down there.  Is always very interesting to talk to him.  It was a long flight.  There was a 9-1/2-hour flight.  He certainly looks good.  He has had no problems with the Coumadin.  Despite the thrombocytopenia, there is been no bleeding.  I think this would be very unusual.  He has had no fever.  He has had the coronavirus vaccine.  Is not had a booster shot yet.  He has had no problems with fever.  He has had no cough or shortness of breath.  There is been no change in bowel or bladder habits.  He has had no leg swelling.  He has had no rashes.  Overall, I would say his performance status is ECOG 0.    Medications:  Current Outpatient Medications:  .  amLODipine (NORVASC) 5 MG tablet, Take 1 tablet (5 mg total) by mouth daily., Disp: 90 tablet, Rfl: 3 .  amoxicillin (AMOXIL) 500 MG tablet, Take 4 tablets by mouth one hour prior to dental appointments, Disp: 20 tablet, Rfl: 1 .  atorvastatin (LIPITOR) 10 MG tablet, TAKE ONE TABLET BY MOUTH ONE TIME DAILY, Disp: 90 tablet, Rfl: 1 .  desonide (DESOWEN) 0.05 % cream, Apply 1 application topically daily as needed., Disp: 60 g, Rfl: 3 .  diphenoxylate-atropine (LOMOTIL) 2.5-0.025 MG tablet, Take 1 tablet by mouth 4 (four) times daily as needed for diarrhea or loose stools., Disp: 60 tablet, Rfl: 1 .  metroNIDAZOLE (FLAGYL) 500 MG tablet, Take 1 tablet (500 mg total) by mouth 3 (three) times daily., Disp: 30 tablet, Rfl: 0 .  pantoprazole (PROTONIX) 40 MG tablet, Take 1 tablet (40 mg total) by mouth daily., Disp: 90  tablet, Rfl: 3 .  SUMAtriptan (IMITREX) 25 MG tablet, Take 1 tablet (25 mg total) by mouth every 2 (two) hours as needed for migraine. May repeat in 2 hours if headache persists or recurs., Disp: 12 tablet, Rfl: 5 .  tadalafil (CIALIS) 5 MG tablet, Take 1 tablet (5 mg total) by mouth daily., Disp: 30 tablet, Rfl: 6 .  triamcinolone cream (KENALOG) 0.1 %, Apply 1 application topically as needed. , Disp: , Rfl:  .  warfarin (JANTOVEN) 7.5 MG tablet, Take as directed by anticoagulation clinic, Disp: 100 tablet, Rfl: 1  Allergies:  Allergies  Allergen Reactions  . Codeine Rash  . Terbinafine Itching    Past Medical History, Surgical history, Social history, and Family History were reviewed and updated.  Review of Systems: Review of Systems  Constitutional: Negative.   HENT:  Negative.   Eyes: Negative.   Respiratory: Negative.   Cardiovascular: Negative.   Gastrointestinal: Negative.   Endocrine: Negative.   Genitourinary: Negative.    Musculoskeletal: Negative.   Skin: Negative.   Neurological: Negative.   Hematological: Negative.   Psychiatric/Behavioral: Negative.     Physical Exam:  weight is 177 lb 12 oz (80.6 kg). His oral temperature is 97.5 F (36.4 C) (abnormal). His blood pressure is 127/69 and his pulse is 61. His  respiration is 18 and oxygen saturation is 100%.   Wt Readings from Last 3 Encounters:  01/31/20 177 lb 12 oz (80.6 kg)  12/26/19 176 lb (79.8 kg)  11/14/19 172 lb 3.2 oz (78.1 kg)    Physical Exam Vitals reviewed.  HENT:     Head: Normocephalic and atraumatic.  Eyes:     Pupils: Pupils are equal, round, and reactive to light.  Cardiovascular:     Rate and Rhythm: Normal rate and regular rhythm.     Heart sounds: Normal heart sounds.     Comments: Cardiac exam shows a regular rate and rhythm.  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  I hear no murmurs or rubs or bruits. Pulmonary:     Effort: Pulmonary effort is normal.      Breath sounds: Normal breath sounds.  Abdominal:     General: Bowel sounds are normal.     Palpations: Abdomen is soft.  Musculoskeletal:        General: No tenderness or deformity. Normal range of motion.     Cervical back: Normal range of motion.  Lymphadenopathy:     Cervical: No cervical adenopathy.  Skin:    General: Skin is warm and dry.     Findings: No erythema or rash.  Neurological:     Mental Status: He is alert and oriented to person, place, and time.  Psychiatric:        Behavior: Behavior normal.        Thought Content: Thought content normal.        Judgment: Judgment normal.      Lab Results  Component Value Date   WBC 2.7 (L) 01/31/2020   HGB 13.6 01/31/2020   HCT 39.3 01/31/2020   MCV 100.8 (H) 01/31/2020   PLT 70 (L) 01/31/2020     Chemistry      Component Value Date/Time   NA 141 11/01/2019 1131   NA 140 11/18/2018 1000   K 4.7 11/01/2019 1131   CL 105 11/01/2019 1131   CO2 32 11/01/2019 1131   BUN 18 11/01/2019 1131   BUN 13 11/18/2018 1000   CREATININE 1.17 11/01/2019 1131      Component Value Date/Time   CALCIUM 10.4 (H) 11/01/2019 1131   ALKPHOS 78 11/01/2019 1131   AST 21 11/01/2019 1131   ALT 23 11/01/2019 1131   BILITOT 0.7 11/01/2019 1131       Impression and Plan: Patrick [name] is a 79 year old white male.  He has mild leukopenia and thrombocytopenia.  I looked at his blood smear.  I really do not see anything that looked suspicious on the blood smear.  I still think that myelodysplasia is a possibility.  I have yet to do a bone marrow biopsy on him.  I just think that because he is asymptomatic right now, we can just hold off on this.  His blood counts really are not that much different.  His white cell count is going down to just a tad.  I think we can now get him through the holidays.  I will get him back in 3 months.  If I see his white cell count going down further, then we may have to do a bone marrow test.      Patrick Napoleon, MD 12/7/20219:58 AM

## 2020-02-01 ENCOUNTER — Ambulatory Visit: Payer: Medicare Other | Admitting: Hematology & Oncology

## 2020-02-01 ENCOUNTER — Other Ambulatory Visit: Payer: Medicare Other

## 2020-02-13 ENCOUNTER — Ambulatory Visit (INDEPENDENT_AMBULATORY_CARE_PROVIDER_SITE_OTHER): Payer: Medicare Other | Admitting: Pharmacist

## 2020-02-13 ENCOUNTER — Other Ambulatory Visit: Payer: Self-pay

## 2020-02-13 DIAGNOSIS — Z7901 Long term (current) use of anticoagulants: Secondary | ICD-10-CM

## 2020-02-13 DIAGNOSIS — I712 Thoracic aortic aneurysm, without rupture, unspecified: Secondary | ICD-10-CM

## 2020-02-13 DIAGNOSIS — I359 Nonrheumatic aortic valve disorder, unspecified: Secondary | ICD-10-CM

## 2020-02-13 DIAGNOSIS — Z5181 Encounter for therapeutic drug level monitoring: Secondary | ICD-10-CM

## 2020-02-13 LAB — MYELODYSPLASTIC SYNDROME (MDS) PANEL

## 2020-02-13 LAB — POCT INR: INR: 2.5 (ref 2.0–3.0)

## 2020-02-13 NOTE — Patient Instructions (Signed)
Description   Continue on same dosage 1 tablet daily except 1/2 tablet on Sundays. Recheck INR in 4 weeks. Call with any questions or new medications #[phone]. Started [place] in January 2021.      

## 2020-02-19 DIAGNOSIS — Z23 Encounter for immunization: Secondary | ICD-10-CM | POA: Diagnosis not present

## 2020-02-22 ENCOUNTER — Other Ambulatory Visit: Payer: Self-pay | Admitting: Internal Medicine

## 2020-02-22 ENCOUNTER — Telehealth: Payer: Self-pay | Admitting: Internal Medicine

## 2020-02-22 MED ORDER — CEFUROXIME AXETIL 500 MG PO TABS: ORAL_TABLET | ORAL | 0 refills | Status: AC

## 2020-02-22 MED ORDER — AMOXICILLIN 500 MG PO TABS
ORAL_TABLET | ORAL | 1 refills | Status: DC
Start: 2020-02-22 — End: 2020-08-29

## 2020-02-22 NOTE — Telephone Encounter (Signed)
[facility] pharmacy called and said that there was a drug interaction with cefUROXime (CEFTIN) 500 MG tablet  pantoprazole (PROTONIX) 40 MG tablet  Please call the pharmacy back at [phone].

## 2020-02-22 NOTE — Telephone Encounter (Signed)
   Pharmacy calling to report possible interaction between cefUROXime (CEFTIN) 500 MG tablet and pantoprazole (PROTONIX) 40 MG tablet Should patient be taking both?  Please call

## 2020-02-23 NOTE — Telephone Encounter (Signed)
Ok to use Thx 

## 2020-02-27 NOTE — Telephone Encounter (Signed)
pharmacy has been notified for the ok to use

## 2020-03-04 ENCOUNTER — Other Ambulatory Visit: Payer: Self-pay | Admitting: Internal Medicine

## 2020-03-19 ENCOUNTER — Ambulatory Visit (INDEPENDENT_AMBULATORY_CARE_PROVIDER_SITE_OTHER): Payer: Medicare Other | Admitting: *Deleted

## 2020-03-19 ENCOUNTER — Ambulatory Visit (INDEPENDENT_AMBULATORY_CARE_PROVIDER_SITE_OTHER): Payer: Medicare Other | Admitting: Internal Medicine

## 2020-03-19 ENCOUNTER — Encounter: Payer: Self-pay | Admitting: Internal Medicine

## 2020-03-19 ENCOUNTER — Other Ambulatory Visit: Payer: Self-pay

## 2020-03-19 DIAGNOSIS — I359 Nonrheumatic aortic valve disorder, unspecified: Secondary | ICD-10-CM

## 2020-03-19 DIAGNOSIS — I712 Thoracic aortic aneurysm, without rupture, unspecified: Secondary | ICD-10-CM

## 2020-03-19 DIAGNOSIS — R519 Headache, unspecified: Secondary | ICD-10-CM

## 2020-03-19 DIAGNOSIS — Z5181 Encounter for therapeutic drug level monitoring: Secondary | ICD-10-CM | POA: Diagnosis not present

## 2020-03-19 DIAGNOSIS — I10 Essential (primary) hypertension: Secondary | ICD-10-CM

## 2020-03-19 LAB — POCT INR: INR: 2.4 (ref 2.0–3.0)

## 2020-03-19 NOTE — Patient Instructions (Signed)
Description   Continue taking 1 tablet daily except 1/2 tablet on Sundays. Recheck INR in 6 weeks. Call with any questions or new medications #[phone]. Started [place] in January 2021.
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
## 2020-03-25 ENCOUNTER — Encounter: Payer: Self-pay | Admitting: Internal Medicine

## 2020-03-25 NOTE — Assessment & Plan Note (Signed)
Exam benign, but suspect his left hemicranial symptoms are neuritic for unclear reason; overall mild and pt declines further imaging or tx, just wanted some reassurance overall

## 2020-03-25 NOTE — Assessment & Plan Note (Signed)
BP Readings from Last 3 Encounters:  03/19/20 138/78  01/31/20 127/69  12/26/19 112/70   Stable, pt to continue medical treatment amlodipine   Current Outpatient Medications (Cardiovascular):  .  amLODipine (NORVASC) 5 MG tablet, Take 1 tablet (5 mg total) by mouth daily. Marland Kitchen  atorvastatin (LIPITOR) 10 MG tablet, TAKE ONE TABLET BY MOUTH ONE TIME DAILY .  tadalafil (CIALIS) 5 MG tablet, Take 1 tablet (5 mg total) by mouth daily.   Current Outpatient Medications (Analgesics):  Marland Kitchen  SUMAtriptan (IMITREX) 25 MG tablet, Take 1 tablet (25 mg total) by mouth every 2 (two) hours as needed for migraine. May repeat in 2 hours if headache persists or recurs.  Current Outpatient Medications (Hematological):  .  warfarin (JANTOVEN) 7.5 MG tablet, Take as directed by anticoagulation clinic  Current Outpatient Medications (Other):  .  amoxicillin (AMOXIL) 500 MG tablet, Take 4 tablets by mouth one hour prior to dental appointments .  desonide (DESOWEN) 0.05 % cream, Apply 1 application topically daily as needed. .  diphenoxylate-atropine (LOMOTIL) 2.5-0.025 MG tablet, Take 1 tablet by mouth 4 (four) times daily as needed for diarrhea or loose stools. .  pantoprazole (PROTONIX) 40 MG tablet, Take 1 tablet (40 mg total) by mouth daily. Marland Kitchen  triamcinolone cream (KENALOG) 0.1 %, Apply 1 application topically as needed.

## 2020-03-25 NOTE — Patient Instructions (Signed)
Ok to follow your symptoms for now  OK to f/u any worsening symptoms or concerns

## 2020-04-02 ENCOUNTER — Encounter: Payer: Self-pay | Admitting: Internal Medicine

## 2020-04-02 ENCOUNTER — Ambulatory Visit (INDEPENDENT_AMBULATORY_CARE_PROVIDER_SITE_OTHER): Payer: Medicare Other | Admitting: Internal Medicine

## 2020-04-02 ENCOUNTER — Other Ambulatory Visit: Payer: Self-pay

## 2020-04-02 DIAGNOSIS — Z789 Other specified health status: Secondary | ICD-10-CM | POA: Diagnosis not present

## 2020-04-02 DIAGNOSIS — H9191 Unspecified hearing loss, right ear: Secondary | ICD-10-CM | POA: Diagnosis not present

## 2020-04-02 DIAGNOSIS — R9389 Abnormal findings on diagnostic imaging of other specified body structures: Secondary | ICD-10-CM

## 2020-04-02 DIAGNOSIS — H6123 Impacted cerumen, bilateral: Secondary | ICD-10-CM | POA: Diagnosis not present

## 2020-04-02 DIAGNOSIS — H9190 Unspecified hearing loss, unspecified ear: Secondary | ICD-10-CM | POA: Insufficient documentation

## 2020-04-02 DIAGNOSIS — H6121 Impacted cerumen, right ear: Secondary | ICD-10-CM

## 2020-04-02 DIAGNOSIS — H6120 Impacted cerumen, unspecified ear: Secondary | ICD-10-CM | POA: Insufficient documentation

## 2020-04-02 DIAGNOSIS — Z7901 Long term (current) use of anticoagulants: Secondary | ICD-10-CM | POA: Diagnosis not present

## 2020-04-02 NOTE — Assessment & Plan Note (Signed)
We discussed CT findings  10/07/18 CT: Pancreas: A 1.2 by 0.8 by 1.1 cm hypodense lesion is present along the anterior pancreatic margin of the junction of the pancreatic head and body. This is likely the lesion referenced in the outside CT report. This lesion is roughly similar sized on 12/31/2016. Although the examination from 03/10/2006 is performed without IV contrast, there is a suggestion that the hypodense lesion of concern was retrospectively present on image 12/2 of that exam, which is Reassuring.  It appears benign.  F/u w/Dr Marin Olp

## 2020-04-02 NOTE — Assessment & Plan Note (Signed)
We keep Patrick [name]'s travel meds up to date Refill prn

## 2020-04-02 NOTE — Progress Notes (Signed)
Subjective:  Patient ID: Patrick [name], male    DOB: [birth year]  Age: 80 y.o. MRN: [id number]  CC: Cerumen Impaction    HPI Patrick [name] presents for R ear pain/wax F/u on a "spot on the pancreas" on CT --   10/07/18 CT: Pancreas: A 1.2 by 0.8 by 1.1 cm hypodense lesion is present along the anterior pancreatic margin of the junction of the pancreatic head and body. This is likely the lesion referenced in the outside CT report. This lesion is roughly similar sized on 12/31/2016. Although the examination from 03/10/2006 is performed without IV contrast, there is a suggestion that the hypodense lesion of concern was retrospectively present on image 12/2 of that exam, which is Reassuring.  F/u on Coumadin therapy [name] is still traveling a lot internationally  Outpatient Medications Prior to Visit  Medication Sig Dispense Refill  . amLODipine (NORVASC) 5 MG tablet Take 1 tablet (5 mg total) by mouth daily. 90 tablet 3  . amoxicillin (AMOXIL) 500 MG tablet Take 4 tablets by mouth one hour prior to dental appointments 20 tablet 1  . atorvastatin (LIPITOR) 10 MG tablet TAKE ONE TABLET BY MOUTH ONE TIME DAILY 90 tablet 1  . desonide (DESOWEN) 0.05 % cream Apply 1 application topically daily as needed. 60 g 3  . diphenoxylate-atropine (LOMOTIL) 2.5-0.025 MG tablet Take 1 tablet by mouth 4 (four) times daily as needed for diarrhea or loose stools. 60 tablet 1  . montelukast (SINGULAIR) 10 MG tablet Take 10 mg by mouth daily.    . pantoprazole (PROTONIX) 40 MG tablet Take 1 tablet (40 mg total) by mouth daily. 90 tablet 3  . SUMAtriptan (IMITREX) 25 MG tablet Take 1 tablet (25 mg total) by mouth every 2 (two) hours as needed for migraine. May repeat in 2 hours if headache persists or recurs. 12 tablet 5  . tadalafil (CIALIS) 5 MG tablet Take 1 tablet (5 mg total) by mouth daily. 30 tablet 6  . triamcinolone cream (KENALOG) 0.1 % Apply 1 application topically as needed.     . warfarin  (JANTOVEN) 7.5 MG tablet Take as directed by anticoagulation clinic 100 tablet 1   No facility-administered medications prior to visit.    ROS: Review of Systems  Constitutional: Negative for appetite change, fatigue and unexpected weight change.  HENT: Positive for hearing loss. Negative for congestion, nosebleeds, sneezing, sore throat and trouble swallowing.   Eyes: Negative for itching and visual disturbance.  Respiratory: Negative for cough.   Cardiovascular: Negative for chest pain, palpitations and leg swelling.  Gastrointestinal: Negative for abdominal distention, blood in stool, diarrhea and nausea.  Genitourinary: Negative for frequency and hematuria.  Musculoskeletal: Negative for back pain, gait problem, joint swelling and neck pain.  Skin: Negative for rash.  Neurological: Negative for dizziness, tremors, speech difficulty and weakness.  Psychiatric/Behavioral: Negative for agitation, dysphoric mood, sleep disturbance and suicidal ideas. The patient is not nervous/anxious.     Objective:  BP 132/80 (BP Location: Left Arm)   Pulse 66   Temp 98.1 F (36.7 C) (Oral)   Wt 178 lb 3.2 oz (80.8 kg)   SpO2 98%   BMI 24.17 kg/m   BP Readings from Last 3 Encounters:  04/02/20 132/80  03/19/20 138/78  01/31/20 127/69    Wt Readings from Last 3 Encounters:  04/02/20 178 lb 3.2 oz (80.8 kg)  03/19/20 177 lb (80.3 kg)  01/31/20 177 lb 12 oz (80.6 kg)    Physical Exam Constitutional:  General: He is not in acute distress.    Appearance: He is well-developed.     Comments: NAD  HENT:     Right Ear: There is impacted cerumen.     Mouth/Throat:     Mouth: Oropharynx is clear and moist.  Eyes:     Conjunctiva/sclera: Conjunctivae normal.     Pupils: Pupils are equal, round, and reactive to light.  Neck:     Thyroid: No thyromegaly.     Vascular: No JVD.  Cardiovascular:     Rate and Rhythm: Normal rate and regular rhythm.     Pulses: Intact distal pulses.      Heart sounds: Normal heart sounds. No murmur heard. No friction rub. No gallop.   Pulmonary:     Effort: Pulmonary effort is normal. No respiratory distress.     Breath sounds: Normal breath sounds. No wheezing or rales.  Chest:     Chest wall: No tenderness.  Abdominal:     General: Bowel sounds are normal. There is no distension.     Palpations: Abdomen is soft. There is no mass.     Tenderness: There is no abdominal tenderness. There is no guarding or rebound.  Musculoskeletal:        General: No tenderness or edema. Normal range of motion.     Cervical back: Normal range of motion.  Lymphadenopathy:     Cervical: No cervical adenopathy.  Skin:    General: Skin is warm and dry.     Findings: No rash.  Neurological:     Mental Status: He is alert and oriented to person, place, and time.     Cranial Nerves: No cranial nerve deficit.     Motor: No abnormal muscle tone.     Coordination: He displays a negative Romberg sign. Coordination normal.     Gait: Gait normal.     Deep Tendon Reflexes: Reflexes are normal and symmetric.  Psychiatric:        Mood and Affect: Mood and affect normal.        Behavior: Behavior normal.        Thought Content: Thought content normal.        Judgment: Judgment normal.   heart click     Procedure Note :     Procedure :  Ear irrigation right  ear   Indication:  Cerumen impaction right and left ears   Risks, including pain, dizziness, eardrum perforation, bleeding, infection and others as well as benefits were explained to the patient in detail. Verbal consent was obtained and the patient agreed to proceed.    We used "The Elephant Ear Irrigation Device" filled with lukewarm water for irrigation. A large amount wax was recovered from both ears. Procedure has also required manual wax removal/instrumentation with an ear wax curette and ear forceps on the right and left ears.   Tolerated well. Complications: None.   Postprocedure instructions  :  Call if problems.   Lab Results  Component Value Date   WBC 2.7 (L) 01/31/2020   HGB 13.6 01/31/2020   HCT 39.3 01/31/2020   PLT 70 (L) 01/31/2020   GLUCOSE 98 11/01/2019   CHOL 129 06/28/2019   TRIG 96.0 06/28/2019   HDL 43.90 06/28/2019   LDLDIRECT 67.0 12/22/2017   LDLCALC 66 06/28/2019   ALT 23 11/01/2019   AST 21 11/01/2019   NA 141 11/01/2019   K 4.7 11/01/2019   CL 105 11/01/2019   CREATININE 1.17 11/01/2019   BUN  18 11/01/2019   CO2 32 11/01/2019   TSH 0.97 06/28/2019   PSA 1.66 06/28/2019   INR 2.4 03/19/2020   HGBA1C 5.3 08/20/2016    DG Chest 2 View  Result Date: 08/20/2019 CLINICAL DATA:  Dry cough for 3 months, history of aortic valve replacement EXAM: CHEST - 2 VIEW COMPARISON:  11/28/2016 FINDINGS: Frontal and lateral views of the chest demonstrate an unremarkable cardiac silhouette. Postsurgical changes are seen from median sternotomy and aortic valve replacement. No airspace disease, effusion, or pneumothorax. No acute bony abnormalities. IMPRESSION: 1. Stable postsurgical changes.  No acute process. Electronically Signed   By: Randa Ngo M.D.   On: 08/20/2019 01:43    Assessment & Plan:   Walker Kehr, MD

## 2020-04-02 NOTE — Assessment & Plan Note (Signed)
See procedure 

## 2020-04-02 NOTE — Assessment & Plan Note (Signed)
Will irrigate right ear.  See procedure

## 2020-04-02 NOTE — Assessment & Plan Note (Signed)
On Coumadin 

## 2020-04-02 NOTE — Patient Instructions (Signed)
For a mild COVID-19 case you can take zinc 50 mg a day for 1 week, vitamin C 1000 mg daily for 1 week, vitamin D2 50,000 units weekly for 2 months (unless you are taking vitamin D daily already), Quercetin 500 mg twice a day for 1 week (if you can get it quick enough).  Maintain good oral hydration and take Tylenol with high fever. 

## 2020-04-11 ENCOUNTER — Ambulatory Visit (INDEPENDENT_AMBULATORY_CARE_PROVIDER_SITE_OTHER): Payer: Medicare Other | Admitting: Internal Medicine

## 2020-04-11 ENCOUNTER — Other Ambulatory Visit: Payer: Self-pay

## 2020-04-11 ENCOUNTER — Encounter: Payer: Self-pay | Admitting: Internal Medicine

## 2020-04-11 DIAGNOSIS — Z7901 Long term (current) use of anticoagulants: Secondary | ICD-10-CM | POA: Diagnosis not present

## 2020-04-11 DIAGNOSIS — G500 Trigeminal neuralgia: Secondary | ICD-10-CM | POA: Insufficient documentation

## 2020-04-11 DIAGNOSIS — M722 Plantar fascial fibromatosis: Secondary | ICD-10-CM

## 2020-04-11 MED ORDER — METHYLPREDNISOLONE 4 MG PO TBPK: ORAL_TABLET | ORAL | 0 refills | Status: DC

## 2020-04-11 MED ORDER — VALACYCLOVIR HCL 1 G PO TABS: 1000.0000 mg | ORAL_TABLET | Freq: Three times a day (TID) | ORAL | 0 refills | Status: DC

## 2020-04-11 NOTE — Progress Notes (Signed)
Subjective:  Patient ID: Patrick [name], male    DOB: 28-Jun-1940  Age: 80 y.o. MRN: [id number]  CC: Hair/Scalp Problem (Pt states he's been having pain on (L) side of his head in the inside. He states sometimes the pain is very sharp)   HPI Patrick [name] presents for episodic severe pain on the left side of his skull in the temporal region, the pain has been stabbing and severe off and on.  No jaw pain, no nausea vomiting, no vision changes. He is also complaining of pain in the left heel and arch of the left foot that has been pretty severe.  Outpatient Medications Prior to Visit  Medication Sig Dispense Refill  . amLODipine (NORVASC) 5 MG tablet Take 1 tablet (5 mg total) by mouth daily. 90 tablet 3  . amoxicillin (AMOXIL) 500 MG tablet Take 4 tablets by mouth one hour prior to dental appointments 20 tablet 1  . atorvastatin (LIPITOR) 10 MG tablet TAKE ONE TABLET BY MOUTH ONE TIME DAILY 90 tablet 1  . desonide (DESOWEN) 0.05 % cream Apply 1 application topically daily as needed. 60 g 3  . diphenoxylate-atropine (LOMOTIL) 2.5-0.025 MG tablet Take 1 tablet by mouth 4 (four) times daily as needed for diarrhea or loose stools. 60 tablet 1  . montelukast (SINGULAIR) 10 MG tablet Take 10 mg by mouth daily.    . pantoprazole (PROTONIX) 40 MG tablet Take 1 tablet (40 mg total) by mouth daily. 90 tablet 3  . SUMAtriptan (IMITREX) 25 MG tablet Take 1 tablet (25 mg total) by mouth every 2 (two) hours as needed for migraine. May repeat in 2 hours if headache persists or recurs. 12 tablet 5  . tadalafil (CIALIS) 5 MG tablet Take 1 tablet (5 mg total) by mouth daily. 30 tablet 6  . triamcinolone cream (KENALOG) 0.1 % Apply 1 application topically as needed.     . warfarin (JANTOVEN) 7.5 MG tablet Take as directed by anticoagulation clinic 100 tablet 1   No facility-administered medications prior to visit.    ROS: Review of Systems  Constitutional: Negative for appetite change, fatigue and  unexpected weight change.  HENT: Negative for congestion, nosebleeds, sneezing, sore throat, trouble swallowing and voice change.   Eyes: Negative for photophobia, pain, itching and visual disturbance.  Respiratory: Negative for cough.   Cardiovascular: Negative for chest pain, palpitations and leg swelling.  Gastrointestinal: Negative for abdominal distention, blood in stool, diarrhea and nausea.  Genitourinary: Negative for frequency and hematuria.  Musculoskeletal: Positive for arthralgias and gait problem. Negative for back pain, joint swelling and neck pain.  Skin: Negative for rash.  Neurological: Positive for headaches. Negative for dizziness, tremors, facial asymmetry, speech difficulty and weakness.  Psychiatric/Behavioral: Negative for agitation, dysphoric mood and sleep disturbance. The patient is not nervous/anxious.     Objective:  BP 122/70 (BP Location: Left Arm)   Pulse (!) 58   Temp 98.7 F (37.1 C) (Oral)   SpO2 97%   BP Readings from Last 3 Encounters:  04/11/20 122/70  04/02/20 132/80  03/19/20 138/78    Wt Readings from Last 3 Encounters:  04/02/20 178 lb 3.2 oz (80.8 kg)  03/19/20 177 lb (80.3 kg)  01/31/20 177 lb 12 oz (80.6 kg)    Physical Exam Constitutional:      General: He is not in acute distress.    Appearance: He is well-developed.     Comments: NAD  HENT:     Mouth/Throat:  Mouth: Oropharynx is clear and moist.  Eyes:     Conjunctiva/sclera: Conjunctivae normal.     Pupils: Pupils are equal, round, and reactive to light.  Neck:     Thyroid: No thyromegaly.     Vascular: No JVD.  Cardiovascular:     Rate and Rhythm: Normal rate and regular rhythm.     Pulses: Intact distal pulses.     Heart sounds: Normal heart sounds. No murmur heard. No friction rub. No gallop.   Pulmonary:     Effort: Pulmonary effort is normal. No respiratory distress.     Breath sounds: Normal breath sounds. No wheezing or rales.  Chest:     Chest wall: No  tenderness.  Abdominal:     General: Bowel sounds are normal. There is no distension.     Palpations: Abdomen is soft. There is no mass.     Tenderness: There is no abdominal tenderness. There is no guarding or rebound.  Musculoskeletal:        General: No tenderness or edema. Normal range of motion.     Cervical back: Normal range of motion.  Lymphadenopathy:     Cervical: No cervical adenopathy.  Skin:    General: Skin is warm and dry.     Findings: No rash.  Neurological:     Mental Status: He is alert and oriented to person, place, and time.     Cranial Nerves: No cranial nerve deficit.     Motor: No abnormal muscle tone.     Coordination: He displays a negative Romberg sign. Coordination normal.     Gait: Gait normal.     Deep Tendon Reflexes: Reflexes are normal and symmetric.  Psychiatric:        Mood and Affect: Mood and affect normal.        Behavior: Behavior normal.        Thought Content: Thought content normal.        Judgment: Judgment normal.   Left temporal scalp is slightly sensitive to palpation Left heel/arch painful to palpation  Lab Results  Component Value Date   WBC 2.7 (L) 01/31/2020   HGB 13.6 01/31/2020   HCT 39.3 01/31/2020   PLT 70 (L) 01/31/2020   GLUCOSE 98 11/01/2019   CHOL 129 06/28/2019   TRIG 96.0 06/28/2019   HDL 43.90 06/28/2019   LDLDIRECT 67.0 12/22/2017   LDLCALC 66 06/28/2019   ALT 23 11/01/2019   AST 21 11/01/2019   NA 141 11/01/2019   K 4.7 11/01/2019   CL 105 11/01/2019   CREATININE 1.17 11/01/2019   BUN 18 11/01/2019   CO2 32 11/01/2019   TSH 0.97 06/28/2019   PSA 1.66 06/28/2019   INR 2.4 03/19/2020   HGBA1C 5.3 08/20/2016    DG Chest 2 View  Result Date: 08/20/2019 CLINICAL DATA:  Dry cough for 3 months, history of aortic valve replacement EXAM: CHEST - 2 VIEW COMPARISON:  11/28/2016 FINDINGS: Frontal and lateral views of the chest demonstrate an unremarkable cardiac silhouette. Postsurgical changes are seen from  median sternotomy and aortic valve replacement. No airspace disease, effusion, or pneumothorax. No acute bony abnormalities. IMPRESSION: 1. Stable postsurgical changes.  No acute process. Electronically Signed   By: Randa Ngo M.D.   On: 08/20/2019 01:43    Assessment & Plan:   There are no diagnoses linked to this encounter.   No orders of the defined types were placed in this encounter.    Follow-up: No follow-ups on file.  Walker Kehr, MD

## 2020-04-11 NOTE — Patient Instructions (Addendum)
Mask spacer  If worse: Medrol pack Valtrex    Trigeminal Neuralgia  Trigeminal neuralgia is a nerve disorder that causes severe pain on one side of the face. The pain may last from a few seconds to several minutes. The pain is usually only on one side of the face. Symptoms may occur for days, weeks, or months and then go away for months or years. The pain may return and be worse than before. What are the causes? This condition is caused by damage or pressure to a nerve in the head that is called the trigeminal nerve. An attack can be triggered by:  Talking.  Chewing.  Putting on makeup.  Washing your face.  Shaving your face.  Brushing your teeth.  Touching your face. What increases the risk? You are more likely to develop this condition if you:  Are 58 years of age or older.  Are male. What are the signs or symptoms? The main symptom of this condition is severe pain in the:  Jaw.  Lips.  Eyes.  Nose.  Scalp.  Forehead.  Face. The pain may be:  Intense.  Stabbing.  Electric.  Shock-like. How is this diagnosed? This condition is diagnosed with a physical exam. A CT scan or an MRI may be done to rule out other conditions that can cause facial pain. How is this treated? This condition may be treated with:  Avoiding the things that trigger your symptoms.  Taking prescription medicines (anticonvulsants).  Having surgery. This may be done in severe cases if other medical treatment does not provide relief.  Having procedures such as ablation, thermal, or radiation therapy. It may take up to one month for treatment to start relieving the pain. Follow these instructions at home: Managing pain  Learn as much as you can about how to manage your pain. Ask your health care provider if a pain specialist would be helpful.  Consider talking with a mental health care provider (psychologist) about how to cope with the pain.  Consider joining a pain  support group. General instructions  Take over-the-counter and prescription medicines only as told by your health care provider.  Avoid the things that trigger your symptoms. It may help to: ? Chew on the unaffected side of your mouth. ? Avoid touching your face. ? Avoid blasts of hot or cold air.  Follow your treatment plan as told by your health care provider. This may include: ? Cognitive or behavioral therapy. ? Gentle, regular exercise. ? Meditation or yoga. ? Aromatherapy.  Keep all follow-up visits as told by your health care provider. You may need to be monitored closely to make sure treatment is working well for you. Where to find more information  Facial [employer] Association: fpa-support.org Contact a health care provider if:  Your medicine is not helping your symptoms.  You have side effects from the medicine used for treatment.  You develop new, unexplained symptoms, such as: ? Double vision. ? Facial weakness. ? Facial numbness. ? Changes in hearing or balance.  You feel depressed. Get help right away if:  Your pain is severe and is not getting better.  You develop suicidal thoughts. If you ever feel like you may hurt yourself or others, or have thoughts about taking your own life, get help right away. You can go to your nearest emergency department or call:  Your local emergency services (911 in the [place]  A suicide crisis helpline, such as the [facility] at (478) 730-1799. This is  open 24 hours a day. Summary  Trigeminal neuralgia is a nerve disorder that causes severe pain on one side of the face. The pain may last from a few seconds to several minutes.  This condition is caused by damage or pressure to a nerve in the head that is called the trigeminal nerve.  Treatment may include avoiding the things that trigger your symptoms, taking medicines, or having surgery or procedures. It may take up to one month for treatment to start  relieving the pain.  Avoid the things that trigger your symptoms.  Keep all follow-up visits as told by your health care provider. You may need to be monitored closely to make sure treatment is working well for you. This information is not intended to replace advice given to you by your health care provider. Make sure you discuss any questions you have with your health care provider. Document Revised: 12/28/2017 Document Reviewed: 12/28/2017 Elsevier Patient Education  2021 Century.    Plantar Fasciitis  Plantar fasciitis is a painful foot condition that affects the heel. It occurs when the band of tissue that connects the toes to the heel bone (plantar fascia) becomes irritated. This can happen as the result of exercising too much or doing other repetitive activities (overuse injury). Plantar fasciitis can cause mild irritation to severe pain that makes it difficult to walk or move. The pain is usually worse in the morning after sleeping, or after sitting or lying down for a period of time. Pain may also be worse after long periods of walking or standing. What are the causes? This condition may be caused by:  Standing for long periods of time.  Wearing shoes that do not have good arch support.  Doing activities that put stress on joints (high-impact activities). This includes ballet and exercise that makes your heart beat faster (aerobic exercise), such as running.  Being overweight.  An abnormal way of walking (gait).  Tight muscles in the back of your lower leg (calf).  High arches in your feet or flat feet.  Starting a new athletic activity. What are the signs or symptoms? The main symptom of this condition is heel pain. Pain may get worse after the following:  Taking the first steps after a time of rest, especially in the morning after awakening, or after you have been sitting or lying down for a while.  Long periods of standing still. Pain may decrease after 30-45  minutes of activity, such as gentle walking. How is this diagnosed? This condition may be diagnosed based on your medical history, a physical exam, and your symptoms. Your health care provider will check for:  A tender area on the bottom of your foot.  A high arch in your foot or flat feet.  Pain when you move your foot.  Difficulty moving your foot. You may have imaging tests to confirm the diagnosis, such as:  X-rays.  Ultrasound.  MRI. How is this treated? Treatment for plantar fasciitis depends on how severe your condition is. Treatment may include:  Rest, ice, pressure (compression), and raising (elevating) the affected foot. This is called RICE therapy. Your health care provider may recommend RICE therapy along with over-the-counter pain medicines to manage your pain.  Exercises to stretch your calves and your plantar fascia.  A splint that holds your foot in a stretched, upward position while you sleep (night splint).  Physical therapy to relieve symptoms and prevent problems in the future.  Injections of steroid medicine (cortisone) to  relieve pain and inflammation.  Stimulating your plantar fascia with electrical impulses (extracorporeal shock wave therapy). This is usually the last treatment option before surgery.  Surgery, if other treatments have not worked after 12 months. Follow these instructions at home: Managing pain, stiffness, and swelling  If directed, put ice on the painful area. To do this: ? Put ice in a plastic bag, or use a frozen bottle of water. ? Place a towel between your skin and the bag or bottle. ? Roll the bottom of your foot over the bag or bottle. ? Do this for 20 minutes, 2-3 times a day.  Wear athletic shoes that have air-sole or gel-sole cushions, or try soft shoe inserts that are designed for plantar fasciitis.  Elevate your foot above the level of your heart while you are sitting or lying down.   Activity  Avoid activities that  cause pain. Ask your health care provider what activities are safe for you.  Do physical therapy exercises and stretches as told by your health care provider.  Try activities and forms of exercise that are easier on your joints (low impact). Examples include swimming, water aerobics, and biking. General instructions  Take over-the-counter and prescription medicines only as told by your health care provider.  Wear a night splint while sleeping, if told by your health care provider. Loosen the splint if your toes tingle, become numb, or turn cold and blue.  Maintain a healthy weight, or work with your health care provider to lose weight as needed.  Keep all follow-up visits. This is important. Contact a health care provider if you have:  Symptoms that do not go away with home treatment.  Pain that gets worse.  Pain that affects your ability to move or do daily activities. Summary  Plantar fasciitis is a painful foot condition that affects the heel. It occurs when the band of tissue that connects the toes to the heel bone (plantar fascia) becomes irritated.  Heel pain is the main symptom of this condition. It may get worse after exercising too much or standing still for a long time.  Treatment varies, but it usually starts with rest, ice, pressure (compression), and raising (elevating) the affected foot. This is called RICE therapy. Over-the-counter medicines can also be used to manage pain. This information is not intended to replace advice given to you by your health care provider. Make sure you discuss any questions you have with your health care provider. Document Revised: 05/30/2019 Document Reviewed: 05/30/2019 Elsevier Patient Education  2021 [employer].

## 2020-04-11 NOTE — Assessment & Plan Note (Addendum)
Mask spacer/extender Medrol pack Valtrex p.o. Head CT offered, patient declined

## 2020-04-15 NOTE — Assessment & Plan Note (Signed)
Left side.  New shoes with good arch support.  Ice.  Voltaren gel as needed.  Medrol pack given

## 2020-04-15 NOTE — Assessment & Plan Note (Signed)
On Coumadin.  Use Medrol with food

## 2020-04-25 ENCOUNTER — Other Ambulatory Visit: Payer: Self-pay

## 2020-04-25 DIAGNOSIS — N529 Male erectile dysfunction, unspecified: Secondary | ICD-10-CM

## 2020-04-25 DIAGNOSIS — N138 Other obstructive and reflux uropathy: Secondary | ICD-10-CM

## 2020-04-25 DIAGNOSIS — N401 Enlarged prostate with lower urinary tract symptoms: Secondary | ICD-10-CM

## 2020-04-25 MED ORDER — TADALAFIL 5 MG PO TABS: 5.0000 mg | ORAL_TABLET | Freq: Every day | ORAL | 3 refills | Status: DC

## 2020-04-30 ENCOUNTER — Ambulatory Visit (INDEPENDENT_AMBULATORY_CARE_PROVIDER_SITE_OTHER): Payer: Medicare Other | Admitting: *Deleted

## 2020-04-30 ENCOUNTER — Other Ambulatory Visit: Payer: Self-pay

## 2020-04-30 DIAGNOSIS — Z5181 Encounter for therapeutic drug level monitoring: Secondary | ICD-10-CM | POA: Diagnosis not present

## 2020-04-30 DIAGNOSIS — I712 Thoracic aortic aneurysm, without rupture, unspecified: Secondary | ICD-10-CM

## 2020-04-30 DIAGNOSIS — I359 Nonrheumatic aortic valve disorder, unspecified: Secondary | ICD-10-CM | POA: Diagnosis not present

## 2020-04-30 LAB — POCT INR: INR: 1.7 — AB (ref 2.0–3.0)

## 2020-04-30 NOTE — Patient Instructions (Signed)
Description   Take 1.5 tablets today and then continue taking 1 tablet daily except 1/2 tablet on Sundays. Recheck INR in 3 weeks. Call with any questions or new medications #[phone]. Started [place] in January 2021.

## 2020-05-01 ENCOUNTER — Encounter: Payer: Self-pay | Admitting: Hematology & Oncology

## 2020-05-01 ENCOUNTER — Telehealth: Payer: Self-pay

## 2020-05-01 ENCOUNTER — Inpatient Hospital Stay (HOSPITAL_BASED_OUTPATIENT_CLINIC_OR_DEPARTMENT_OTHER): Payer: Medicare Other | Admitting: Hematology & Oncology

## 2020-05-01 ENCOUNTER — Other Ambulatory Visit: Payer: Self-pay

## 2020-05-01 ENCOUNTER — Inpatient Hospital Stay: Payer: Medicare Other | Attending: Hematology & Oncology

## 2020-05-01 VITALS — BP 135/79 | HR 59 | Temp 98.3°F | Resp 18 | Wt 176.2 lb

## 2020-05-01 DIAGNOSIS — Z7901 Long term (current) use of anticoagulants: Secondary | ICD-10-CM | POA: Insufficient documentation

## 2020-05-01 DIAGNOSIS — D696 Thrombocytopenia, unspecified: Secondary | ICD-10-CM | POA: Insufficient documentation

## 2020-05-01 DIAGNOSIS — D469 Myelodysplastic syndrome, unspecified: Secondary | ICD-10-CM

## 2020-05-01 DIAGNOSIS — D709 Neutropenia, unspecified: Secondary | ICD-10-CM

## 2020-05-01 DIAGNOSIS — D72819 Decreased white blood cell count, unspecified: Secondary | ICD-10-CM | POA: Insufficient documentation

## 2020-05-01 LAB — RETICULOCYTES
Immature Retic Fract: 13.7 % (ref 2.3–15.9)
RBC.: 3.88 MIL/uL — ABNORMAL LOW (ref 4.22–5.81)
Retic Count, Absolute: 59.8 10*3/uL (ref 19.0–186.0)
Retic Ct Pct: 1.5 % (ref 0.4–3.1)

## 2020-05-01 LAB — CMP (CANCER CENTER ONLY)
ALT: 21 U/L (ref 0–44)
AST: 19 U/L (ref 15–41)
Albumin: 4.4 g/dL (ref 3.5–5.0)
Alkaline Phosphatase: 79 U/L (ref 38–126)
Anion gap: 3 — ABNORMAL LOW (ref 5–15)
BUN: 17 mg/dL (ref 8–23)
CO2: 32 mmol/L (ref 22–32)
Calcium: 10.3 mg/dL (ref 8.9–10.3)
Chloride: 105 mmol/L (ref 98–111)
Creatinine: 1.24 mg/dL (ref 0.61–1.24)
GFR, Estimated: 60 mL/min — ABNORMAL LOW (ref >60)
Glucose, Bld: 114 mg/dL — ABNORMAL HIGH (ref 70–99)
Potassium: 4.8 mmol/L (ref 3.5–5.1)
Sodium: 140 mmol/L (ref 135–145)
Total Bilirubin: 0.8 mg/dL (ref 0.3–1.2)
Total Protein: 6.1 g/dL — ABNORMAL LOW (ref 6.5–8.1)

## 2020-05-01 LAB — CBC WITH DIFFERENTIAL (CANCER CENTER ONLY)
Abs Immature Granulocytes: 0.02 10*3/uL (ref 0.00–0.07)
Basophils Absolute: 0 10*3/uL (ref 0.0–0.1)
Basophils Relative: 0 %
Eosinophils Absolute: 0 10*3/uL (ref 0.0–0.5)
Eosinophils Relative: 1 %
HCT: 38.8 % — ABNORMAL LOW (ref 39.0–52.0)
Hemoglobin: 13.8 g/dL (ref 13.0–17.0)
Immature Granulocytes: 1 %
Lymphocytes Relative: 17 %
Lymphs Abs: 0.5 10*3/uL — ABNORMAL LOW (ref 0.7–4.0)
MCH: 35.1 pg — ABNORMAL HIGH (ref 26.0–34.0)
MCHC: 35.6 g/dL (ref 30.0–36.0)
MCV: 98.7 fL (ref 80.0–100.0)
Monocytes Absolute: 0.2 10*3/uL (ref 0.1–1.0)
Monocytes Relative: 8 %
Neutro Abs: 2 10*3/uL (ref 1.7–7.7)
Neutrophils Relative %: 73 %
Platelet Count: 63 10*3/uL — ABNORMAL LOW (ref 150–400)
RBC: 3.93 MIL/uL — ABNORMAL LOW (ref 4.22–5.81)
RDW: 14.1 % (ref 11.5–15.5)
WBC Count: 2.7 10*3/uL — ABNORMAL LOW (ref 4.0–10.5)
nRBC: 0 % (ref 0.0–0.2)

## 2020-05-01 LAB — SAVE SMEAR(SSMR), FOR PROVIDER SLIDE REVIEW

## 2020-05-01 LAB — LACTATE DEHYDROGENASE: LDH: 265 U/L — ABNORMAL HIGH (ref 98–192)

## 2020-05-01 NOTE — Progress Notes (Signed)
Hematology and Oncology Follow Up Visit  EDRIK RUNDLE [id number] 08-09-1940 80 y.o. 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 Beske is back for his follow-up.  I really should not be all that surprised that he is headed back down to [place].  He is looking for flight to leave next week.  He is going to [place] for a week.  Otherwise he is doing well.  Would we last saw him back in December.  He had a nice Christmas and New Year's.  He had no problems with the coronavirus.  He has had no problems with fever.  He thought he may have had the Omicron variant although is never checked.  He had some congestion and a sore throat.  This lasted for about 4 days.  He has had no change in bowel or bladder habits.  He has had no rashes.  There has been no leg swelling.  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  He has had no problems with bleeding.  There is been no bruising.  Overall, his performance status is ECOG 0.   Medications:  Current Outpatient Medications:  .  amLODipine (NORVASC) 5 MG tablet, Take 1 tablet (5 mg total) by mouth daily., Disp: 90 tablet, Rfl: 3 .  atorvastatin (LIPITOR) 10 MG tablet, TAKE ONE TABLET BY MOUTH ONE TIME DAILY, Disp: 90 tablet, Rfl: 1 .  desonide (DESOWEN) 0.05 % cream, Apply 1 application topically daily as needed., Disp: 60 g, Rfl: 3 .  diphenoxylate-atropine (LOMOTIL) 2.5-0.025 MG tablet, Take 1 tablet by mouth 4 (four) times daily as needed for diarrhea or loose stools., Disp: 60 tablet, Rfl: 1 .  montelukast (SINGULAIR) 10 MG tablet, Take 10 mg by mouth daily., Disp: , Rfl:  .  pantoprazole (PROTONIX) 40 MG tablet, Take 1 tablet (40 mg total) by mouth daily., Disp: 90 tablet, Rfl: 3 .  SUMAtriptan (IMITREX) 25 MG tablet, Take 1 tablet (25 mg total) by mouth every 2 (two) hours as needed for migraine. May repeat in 2 hours if headache  persists or recurs., Disp: 12 tablet, Rfl: 5 .  tadalafil (CIALIS) 5 MG tablet, Take 1 tablet (5 mg total) by mouth daily., Disp: 30 tablet, Rfl: 3 .  triamcinolone cream (KENALOG) 0.1 %, Apply 1 application topically as needed. , Disp: , Rfl:  .  warfarin (JANTOVEN) 7.5 MG tablet, Take as directed by anticoagulation clinic, Disp: 100 tablet, Rfl: 1 .  amoxicillin (AMOXIL) 500 MG tablet, Take 4 tablets by mouth one hour prior to dental appointments (Patient not taking: Reported on 05/01/2020), Disp: 20 tablet, Rfl: 1 .  methylPREDNISolone (MEDROL DOSEPAK) 4 MG TBPK tablet, As directed, Disp: 21 tablet, Rfl: 0 .  valACYclovir (VALTREX) 1000 MG tablet, Take 1 tablet (1,000 mg total) by mouth 3 (three) times daily., Disp: 21 tablet, Rfl: 0  Allergies:  Allergies  Allergen Reactions  . Codeine Rash  . Terbinafine Itching    Past Medical History, Surgical history, Social history, and Family History were reviewed and updated.  Review of Systems: Review of Systems  Constitutional: Negative.   HENT:  Negative.   Eyes: Negative.   Respiratory: Negative.   Cardiovascular: Negative.   Gastrointestinal: Negative.   Endocrine: Negative.   Genitourinary: Negative.    Musculoskeletal: Negative.   Skin: Negative.   Neurological: Negative.   Hematological: Negative.  Psychiatric/Behavioral: Negative.     Physical Exam:  weight is 176 lb 4 oz (79.9 kg). His oral temperature is 98.3 F (36.8 C). His blood pressure is 135/79 and his pulse is 59 (abnormal). His respiration is 18 and oxygen saturation is 100%.   Wt Readings from Last 3 Encounters:  05/01/20 176 lb 4 oz (79.9 kg)  04/02/20 178 lb 3.2 oz (80.8 kg)  03/19/20 177 lb (80.3 kg)    Physical Exam Vitals reviewed.  HENT:     Head: Normocephalic and atraumatic.  Eyes:     Pupils: Pupils are equal, round, and reactive to light.  Cardiovascular:     Rate and Rhythm: Normal rate and regular rhythm.     Heart sounds: Normal heart  sounds.     Comments: Cardiac exam shows a regular rate and rhythm.  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  I hear no murmurs or rubs or bruits. Pulmonary:     Effort: Pulmonary effort is normal.     Breath sounds: Normal breath sounds.  Abdominal:     General: Bowel sounds are normal.     Palpations: Abdomen is soft.  Musculoskeletal:        General: No tenderness or deformity. Normal range of motion.     Cervical back: Normal range of motion.  Lymphadenopathy:     Cervical: No cervical adenopathy.  Skin:    General: Skin is warm and dry.     Findings: No erythema or rash.  Neurological:     Mental Status: He is alert and oriented to person, place, and time.  Psychiatric:        Behavior: Behavior normal.        Thought Content: Thought content normal.        Judgment: Judgment normal.      Lab Results  Component Value Date   WBC 2.7 (L) 05/01/2020   HGB 13.8 05/01/2020   HCT 38.8 (L) 05/01/2020   MCV 98.7 05/01/2020   PLT 63 (L) 05/01/2020     Chemistry      Component Value Date/Time   NA 140 05/01/2020 1128   NA 140 11/18/2018 1000   K 4.8 05/01/2020 1128   CL 105 05/01/2020 1128   CO2 32 05/01/2020 1128   BUN 17 05/01/2020 1128   BUN 13 11/18/2018 1000   CREATININE 1.24 05/01/2020 1128      Component Value Date/Time   CALCIUM 10.3 05/01/2020 1128   ALKPHOS 79 05/01/2020 1128   AST 19 05/01/2020 1128   ALT 21 05/01/2020 1128   BILITOT 0.8 05/01/2020 1128       Impression and Plan: Mr. Simkin is a 80 year old white male.  He has mild leukopenia and thrombocytopenia.  I looked at his blood smear.  I really do not see anything that looked suspicious on the blood smear.  I still think that myelodysplasia is a possibility.  Again, he is not symptomatic.  His blood counts are holding pretty stable.  I am sure that at some point, we probably will need to do a bone marrow test on him.  I do still think that we would be changing our  recommendations right now as he is asymptomatic.  We will plan to get her back in 3 more months.  I know he will have a wonderful time down in [place].  He goes down all the time.   Volanda Napoleon, MD 3/8/202212:44 PM

## 2020-05-01 NOTE — Telephone Encounter (Signed)
appts made and printed for pt Patrick [name] °

## 2020-05-02 LAB — IRON AND TIBC
Iron: 130 ug/dL (ref 42–163)
Saturation Ratios: 52 % (ref 20–55)
TIBC: 250 ug/dL (ref 202–409)
UIBC: 120 ug/dL (ref 117–376)

## 2020-05-02 LAB — FERRITIN: Ferritin: 447 ng/mL — ABNORMAL HIGH (ref 24–336)

## 2020-05-09 DIAGNOSIS — Z1152 Encounter for screening for COVID-19: Secondary | ICD-10-CM | POA: Diagnosis not present

## 2020-05-22 ENCOUNTER — Ambulatory Visit (INDEPENDENT_AMBULATORY_CARE_PROVIDER_SITE_OTHER): Payer: Medicare Other

## 2020-05-22 ENCOUNTER — Other Ambulatory Visit: Payer: Self-pay

## 2020-05-22 DIAGNOSIS — I359 Nonrheumatic aortic valve disorder, unspecified: Secondary | ICD-10-CM | POA: Diagnosis not present

## 2020-05-22 DIAGNOSIS — I712 Thoracic aortic aneurysm, without rupture, unspecified: Secondary | ICD-10-CM

## 2020-05-22 DIAGNOSIS — Z5181 Encounter for therapeutic drug level monitoring: Secondary | ICD-10-CM | POA: Diagnosis not present

## 2020-05-22 LAB — POCT INR: INR: 2.2 (ref 2.0–3.0)

## 2020-05-22 NOTE — Patient Instructions (Signed)
Description   Continue on same dosage 1 tablet daily except 1/2 tablet on Sundays. Recheck INR in 4 weeks. Call with any questions or new medications #[phone]. Started [name] in January 2021.

## 2020-06-02 ENCOUNTER — Other Ambulatory Visit: Payer: Self-pay | Admitting: Cardiovascular Disease

## 2020-06-03 IMAGING — US US THYROID
1 of 2 series · 12 of 25 positions shown · non-contrast
Comparison: CT 11/19/2018

CLINICAL DATA: Nodules seen on CT chest

EXAM:
THYROID ULTRASOUND
TECHNIQUE: Ultrasound examination of the thyroid gland and adjacent soft
tissues was performed.

[Series 1: us thyroid · 0.06mm/px · 12 of 83 slices shown]
[im 4/83]
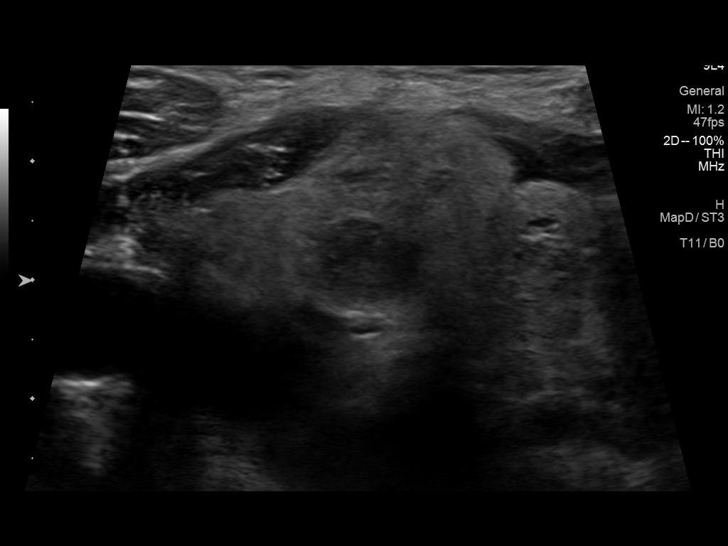
[im 11/83]
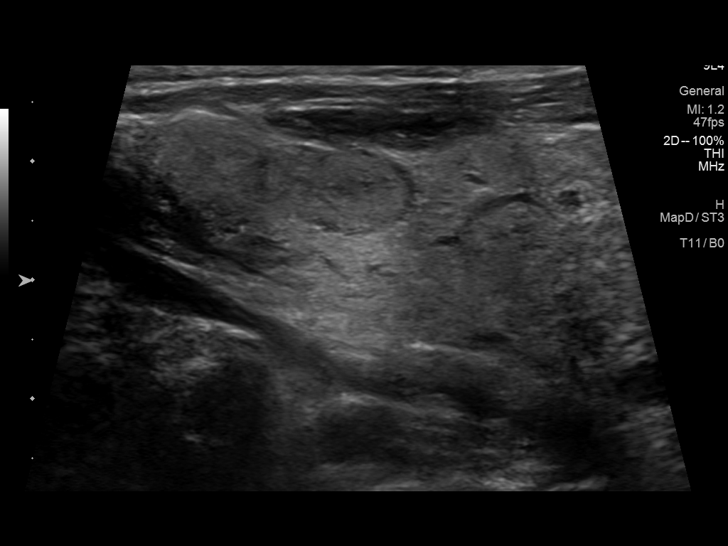
[im 18/83]
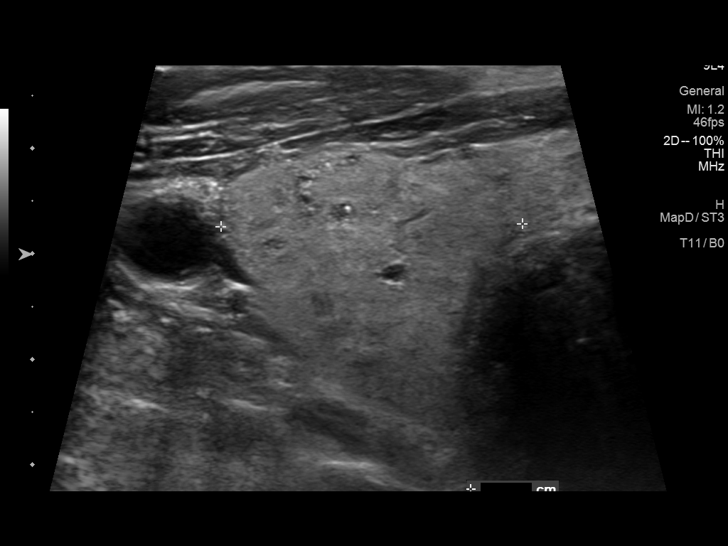
[im 25/83]
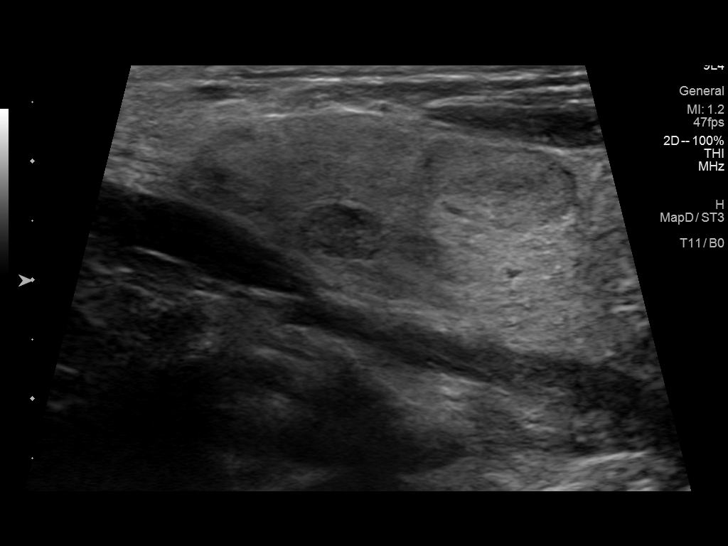
[im 33/83]
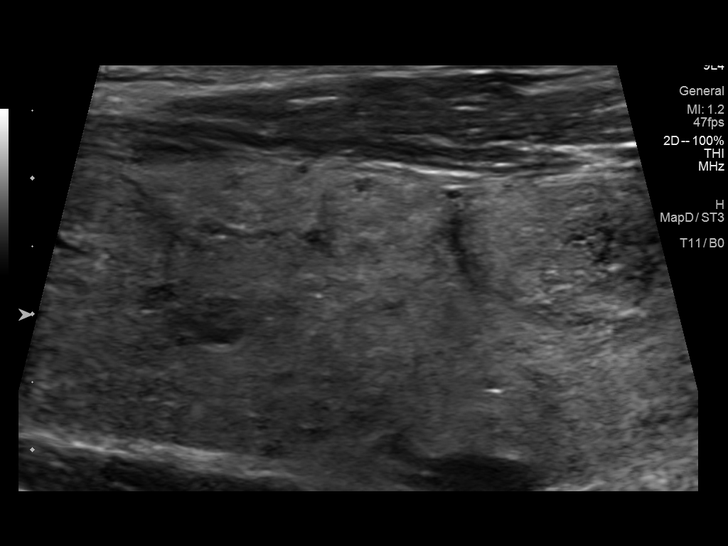
[im 40/83]
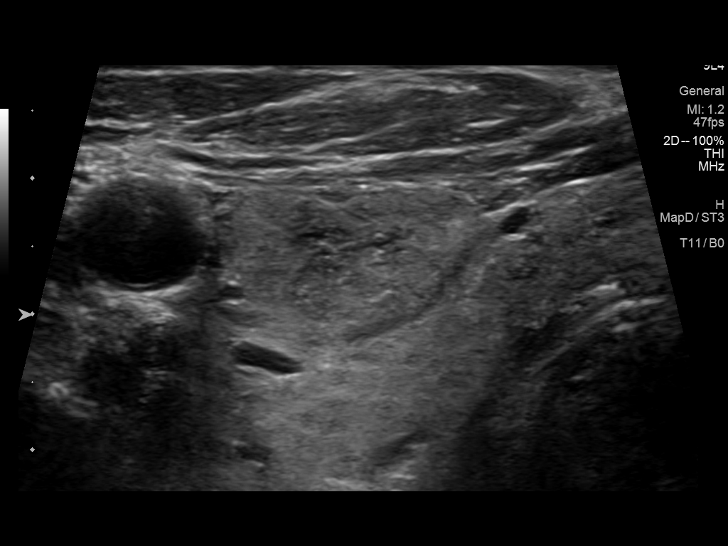
[im 47/83]
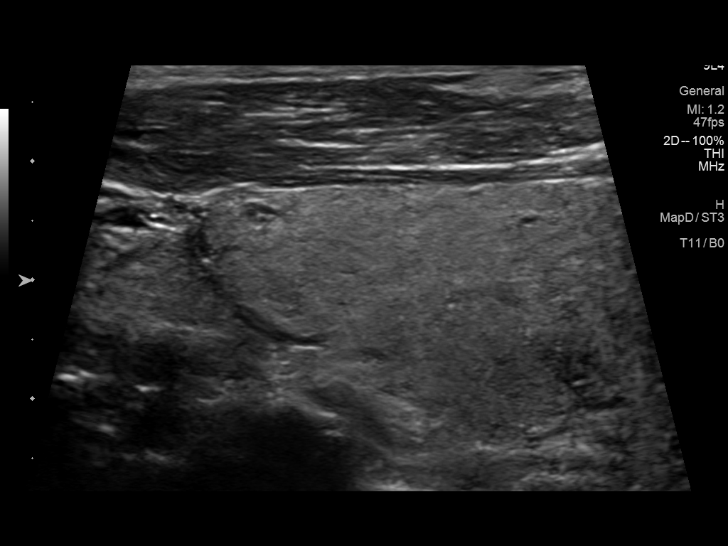
[im 54/83]
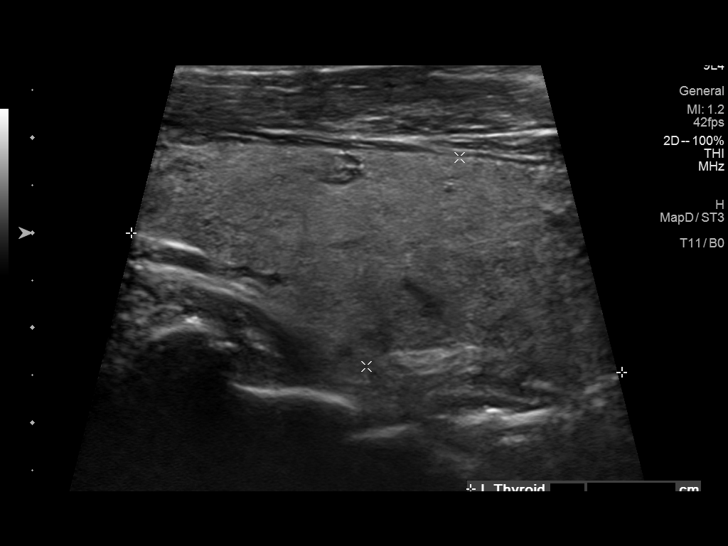
[im 61/83]
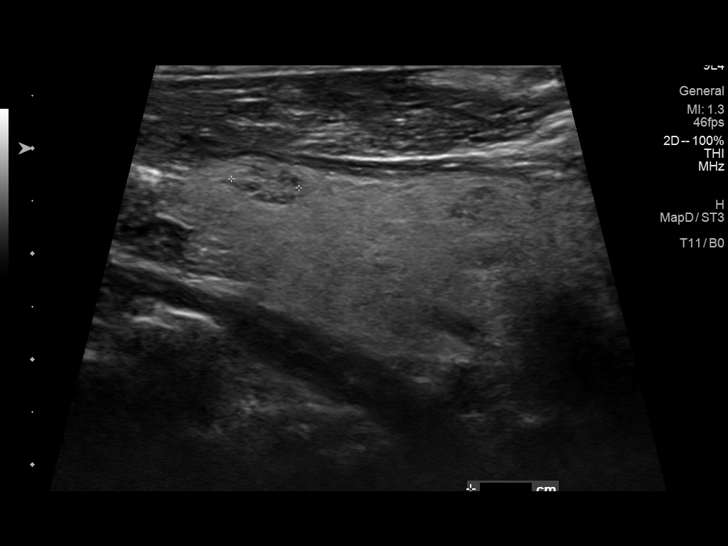
[im 68/83]
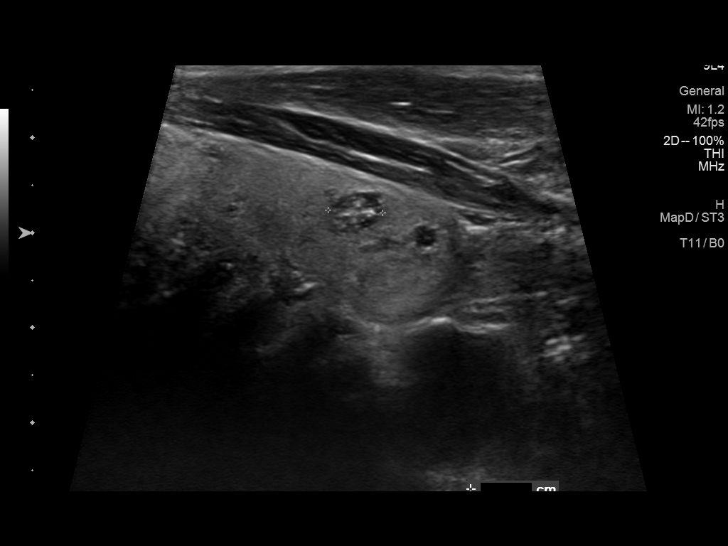
[im 75/83]
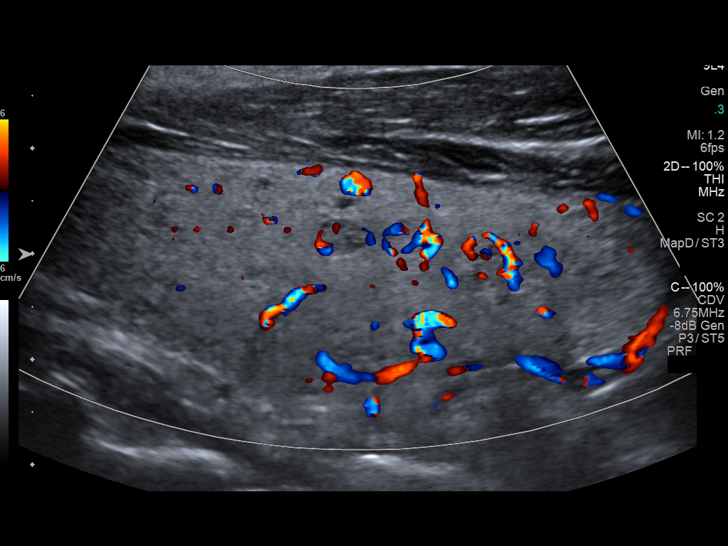
[im 83/83]
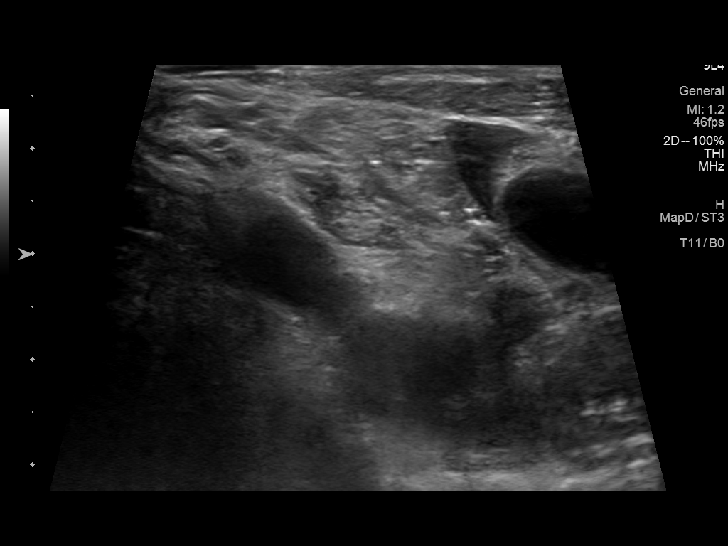

[12 of 25 positions shown; findings below may reference images not displayed]

FINDINGS: Parenchymal Echotexture: Moderately heterogenous

Isthmus: 0.9 cm thickness

Right lobe: 8 x 3.3 x 2.9 cm

Left lobe: 6.4 x 2.6 x 2.7 cm

_________________________________________________________

Estimated total number of nodules >/= 1 cm: 5

Number of spongiform nodules >/=  2 cm not described below (TR1): 0

Number of mixed cystic and solid nodules >/= 1.5 cm not described
below (TR2): 0

_________________________________________________________

Nodule # 1:

Location: Isthmus;

Maximum size: 1.2 cm; Other 2 dimensions: 0.8 x 0.7 cm

Composition: solid/almost completely solid (2)

Echogenicity: hypoechoic (2)

Shape: not taller-than-wide (0)

Margins: smooth (0)

Echogenic foci: none (0)

ACR TI-RADS total points: 4.

ACR TI-RADS risk category: TR4 (4-6 points).

ACR TI-RADS recommendations:

*Given size (>/= 1 - 1.4 cm) and appearance, a follow-up ultrasound
in 1 year should be considered based on TI-RADS criteria.

_________________________________________________________

0.8 cm hypoechoic nodule without calcifications, superior right;
This nodule does NOT meet TI-RADS criteria for biopsy or dedicated
follow-up.

Nodule # 3:

Location: Right; Mid

Maximum size: 1.4 cm; Other 2 dimensions: 1.2 x 0.7 cm

Composition: solid/almost completely solid (2)

Echogenicity: isoechoic (1)

Shape: not taller-than-wide (0)

Margins: smooth (0)

Echogenic foci: none (0)

ACR TI-RADS total points: 3.

ACR TI-RADS risk category: TR3 (3 points).

ACR TI-RADS recommendations:

Given size (<1.4 cm) and appearance, this nodule does NOT meet
TI-RADS criteria for biopsy or dedicated follow-up.

_________________________________________________________

Nodule # 4:

Location: Right; medial

Maximum size: 1.1 cm; Other 2 dimensions: 1 x 0.8 cm

Composition: solid/almost completely solid (2)

Echogenicity: isoechoic (1)

Shape: not taller-than-wide (0)

Margins: ill-defined (0)

Echogenic foci: macrocalcifications (1)

ACR TI-RADS total points: 4.

ACR TI-RADS risk category: TR4 (4-6 points).

ACR TI-RADS recommendations:

*Given size (>/= 1 - 1.4 cm) and appearance, a follow-up ultrasound
in 1 year should be considered based on TI-RADS criteria.

_________________________________________________________

Nodule # 5:

Location: Right; Inferior

Maximum size: 2.5 cm; Other 2 dimensions: 2.1 x 1.1 cm

Composition: solid/almost completely solid (2)

Echogenicity: hypoechoic (2)

Shape:

Margins: ill-defined (0)

Echogenic foci: none (0)

ACR TI-RADS total points: .

ACR TI-RADS risk category: TR4 (4-6 points).

ACR TI-RADS recommendations:

**Given size (>/= 1.5 cm) and appearance, fine needle aspiration of
this moderately suspicious nodule should be considered based on
TI-RADS criteria.

_________________________________________________________

0.7 cm hypoechoic nodule without calcifications, superior left; This
nodule does NOT meet TI-RADS criteria for biopsy or dedicated
follow-up.

0.6 cm mixed solid/cystic nodule, inferior left; This nodule does
NOT meet TI-RADS criteria for biopsy or dedicated follow-up.

Nodule # 8:

Location: Left; medial

Maximum size: 1.5 cm; Other 2 dimensions: 1.2 x 1.2 cm

Composition: solid/almost completely solid (2)

Echogenicity: isoechoic (1)

Shape: not taller-than-wide (0)

Margins: smooth (0)

Echogenic foci: none (0)

ACR TI-RADS total points: 3.

ACR TI-RADS risk category: TR3 (3 points).

ACR TI-RADS recommendations:

*Given size (>/= 1.5 - 2.4 cm) and appearance, a follow-up
ultrasound in 1 year should be considered based on TI-RADS criteria.
IMPRESSION: 1. Thyromegaly with bilateral nodules.
2. Recommend FNA biopsy of 2.5 cm inferior right TR4 nodule.
3. Recommend annual/biennial ultrasound follow-up of additional
nodules as above, until stability x5 years confirmed.

The above is in keeping with the ACR TI-RADS recommendations - [HOSPITAL] 9897;[DATE].

## 2020-06-19 ENCOUNTER — Ambulatory Visit (INDEPENDENT_AMBULATORY_CARE_PROVIDER_SITE_OTHER): Payer: Medicare Other | Admitting: *Deleted

## 2020-06-19 ENCOUNTER — Other Ambulatory Visit: Payer: Self-pay

## 2020-06-19 DIAGNOSIS — I712 Thoracic aortic aneurysm, without rupture, unspecified: Secondary | ICD-10-CM

## 2020-06-19 DIAGNOSIS — I359 Nonrheumatic aortic valve disorder, unspecified: Secondary | ICD-10-CM

## 2020-06-19 DIAGNOSIS — Z5181 Encounter for therapeutic drug level monitoring: Secondary | ICD-10-CM

## 2020-06-19 LAB — POCT INR: INR: 2 (ref 2.0–3.0)

## 2020-06-19 NOTE — Patient Instructions (Addendum)
Description   Today take 1.5 tablets then continue taking on same dosage 1 tablet daily except 1/2 tablet on Sundays. Recheck INR in 5 weeks. Call with any questions or new medications #[phone]. Started [place] in January 2021.

## 2020-06-25 DIAGNOSIS — D235 Other benign neoplasm of skin of trunk: Secondary | ICD-10-CM | POA: Diagnosis not present

## 2020-06-25 DIAGNOSIS — D485 Neoplasm of uncertain behavior of skin: Secondary | ICD-10-CM | POA: Diagnosis not present

## 2020-06-25 DIAGNOSIS — L821 Other seborrheic keratosis: Secondary | ICD-10-CM | POA: Diagnosis not present

## 2020-06-25 DIAGNOSIS — L720 Epidermal cyst: Secondary | ICD-10-CM | POA: Diagnosis not present

## 2020-06-25 DIAGNOSIS — Z85828 Personal history of other malignant neoplasm of skin: Secondary | ICD-10-CM | POA: Diagnosis not present

## 2020-06-25 DIAGNOSIS — L723 Sebaceous cyst: Secondary | ICD-10-CM | POA: Diagnosis not present

## 2020-06-25 DIAGNOSIS — L820 Inflamed seborrheic keratosis: Secondary | ICD-10-CM | POA: Diagnosis not present

## 2020-06-25 DIAGNOSIS — L570 Actinic keratosis: Secondary | ICD-10-CM | POA: Diagnosis not present

## 2020-06-28 DIAGNOSIS — Z961 Presence of intraocular lens: Secondary | ICD-10-CM | POA: Diagnosis not present

## 2020-06-28 DIAGNOSIS — H26491 Other secondary cataract, right eye: Secondary | ICD-10-CM | POA: Diagnosis not present

## 2020-06-28 DIAGNOSIS — H5213 Myopia, bilateral: Secondary | ICD-10-CM | POA: Diagnosis not present

## 2020-06-28 DIAGNOSIS — H1131 Conjunctival hemorrhage, right eye: Secondary | ICD-10-CM | POA: Diagnosis not present

## 2020-07-15 IMAGING — US US FNA BIOPSY THYROID 1ST LESION
1 series · 13 of 14 positions shown · non-contrast
Comparison: US Thyroid 11/30/18

MEDICATIONS:
5 cc 1% lidocaine

COMPLICATIONS:
None immediate.

INDICATION: Indeterminate thyroid nodule

Right Inferior thyroid nodule
2.5 cm
EXAM:
ULTRASOUND GUIDED FINE NEEDLE ASPIRATION OF INDETERMINATE THYROID
NODULE
TECHNIQUE: Informed written consent was obtained from the patient after a
discussion of the risks, benefits and alternatives to treatment.
Questions regarding the procedure were encouraged and answered. A
timeout was performed prior to the initiation of the procedure.

[Series 1: us fna biopsy thyroid 1st lesion · 0.06mm/px · 14 acquisitions, 13 frames shown]
[im 1/14]
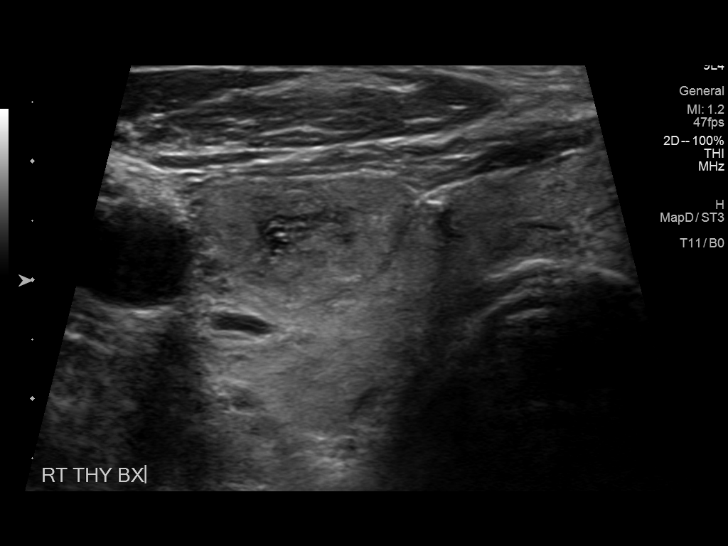
[im 2/14]
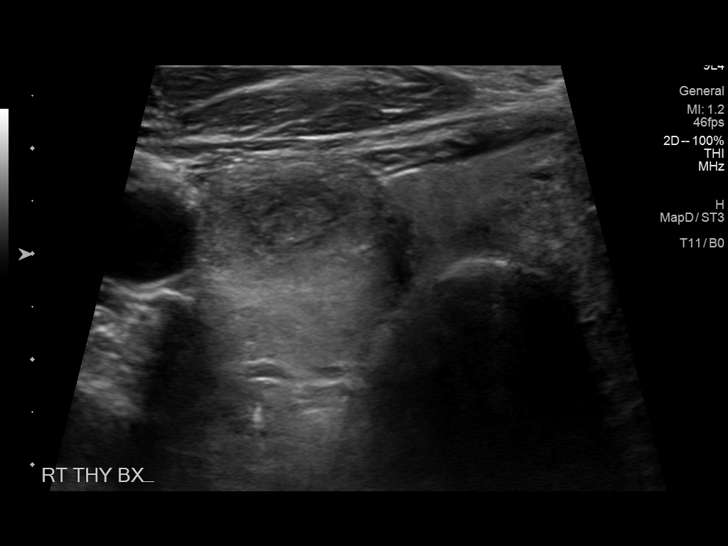
[im 3/14]
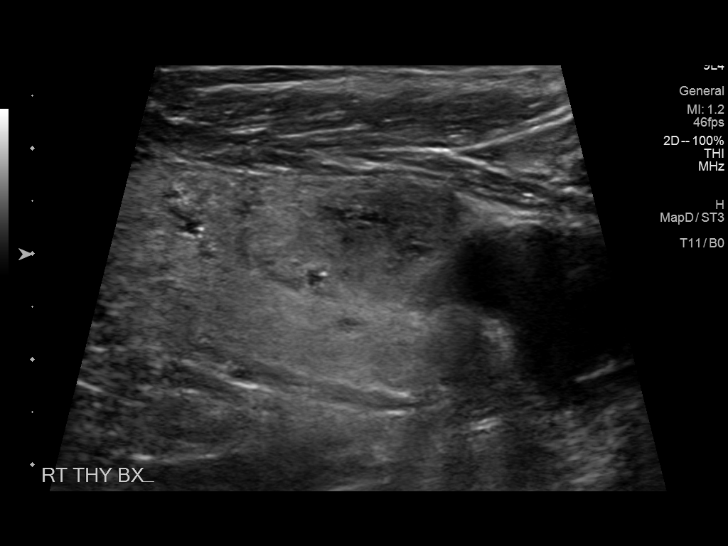
[im 4/14]
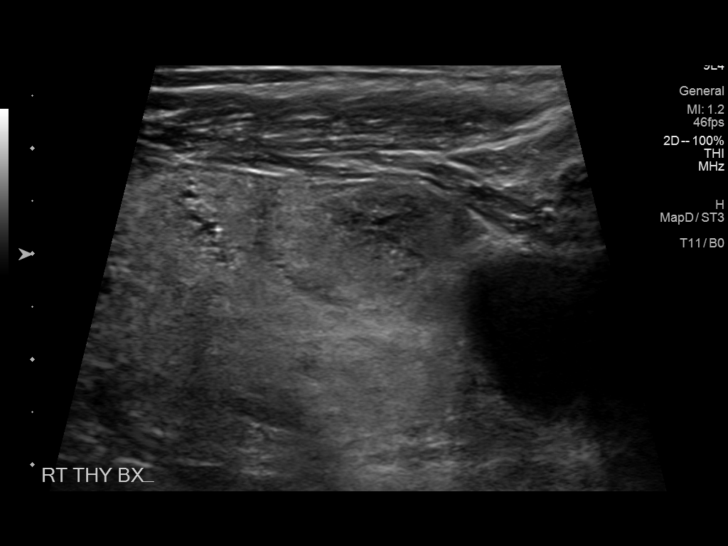
[im 5/14]
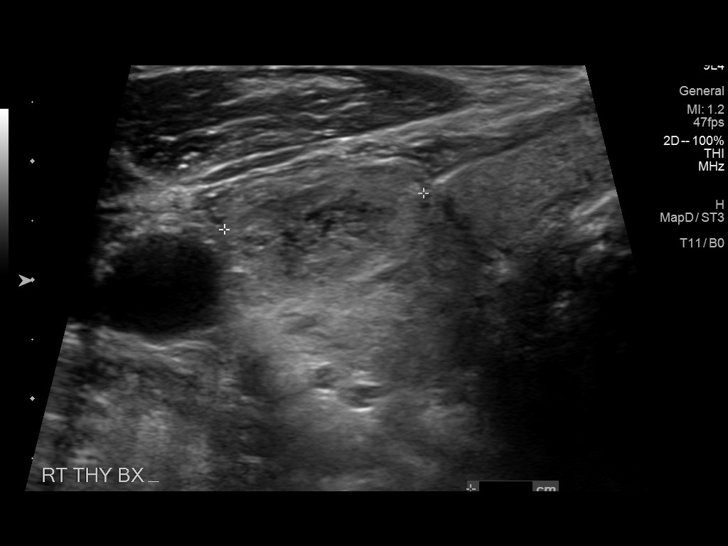
[im 6/14]
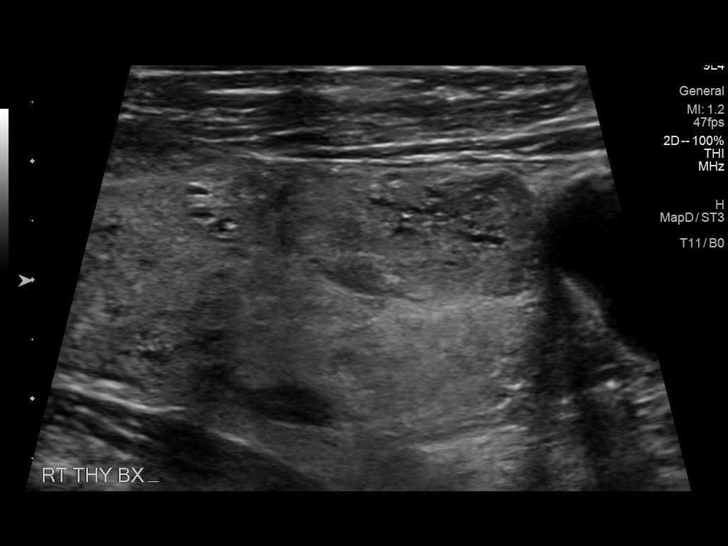
[im 8/14]
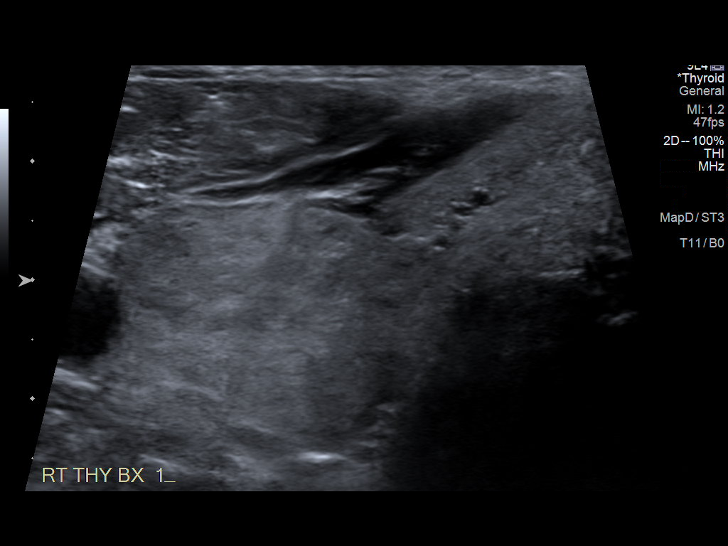
[im 9/14]
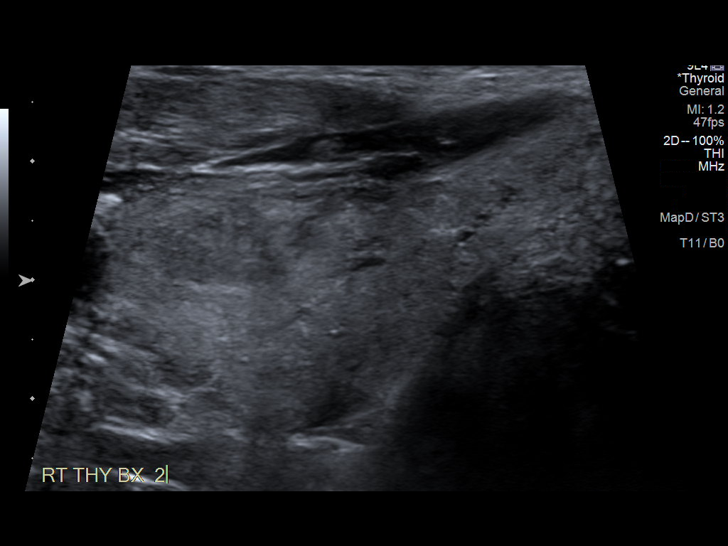
[im 10/14]
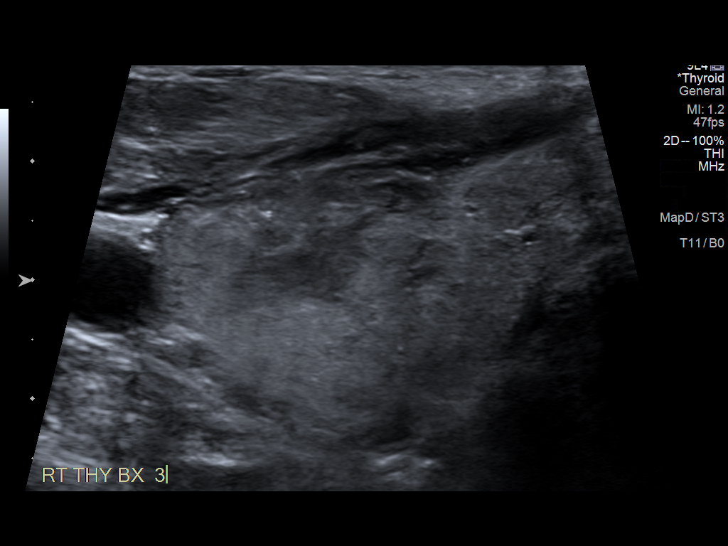
[im 11/14]
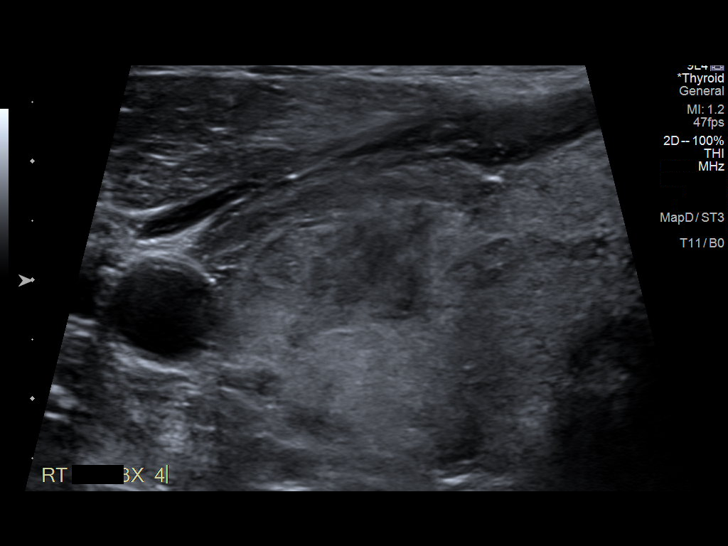
[im 12/14]
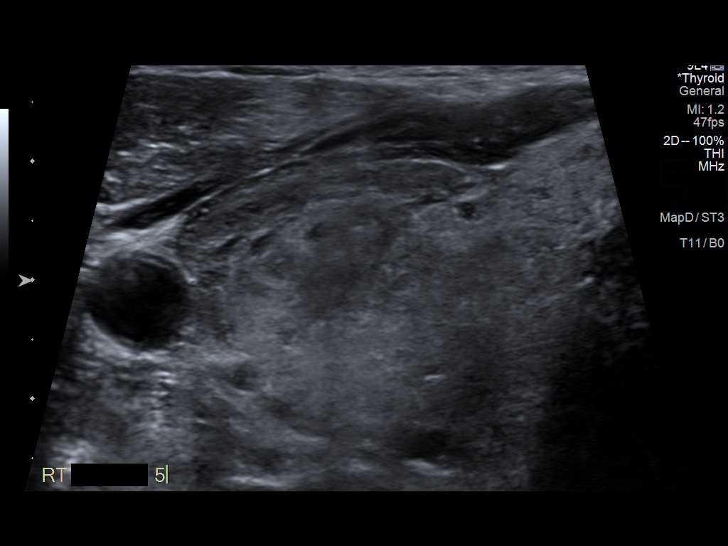
[im 13/14]
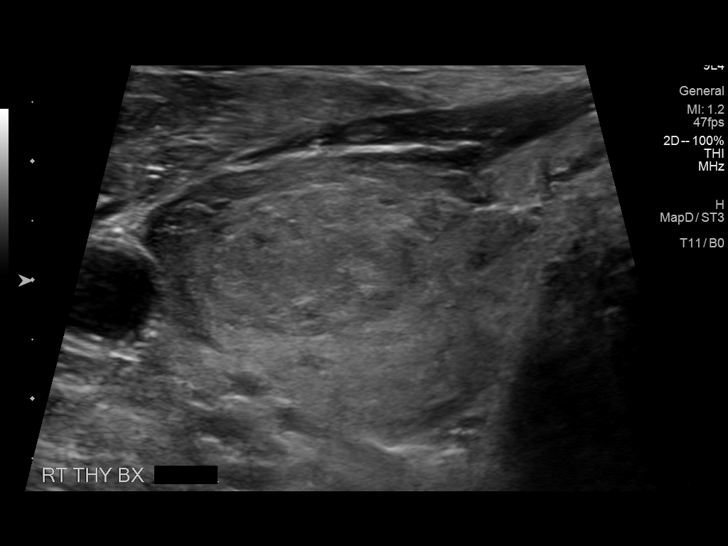
[im 14/14]
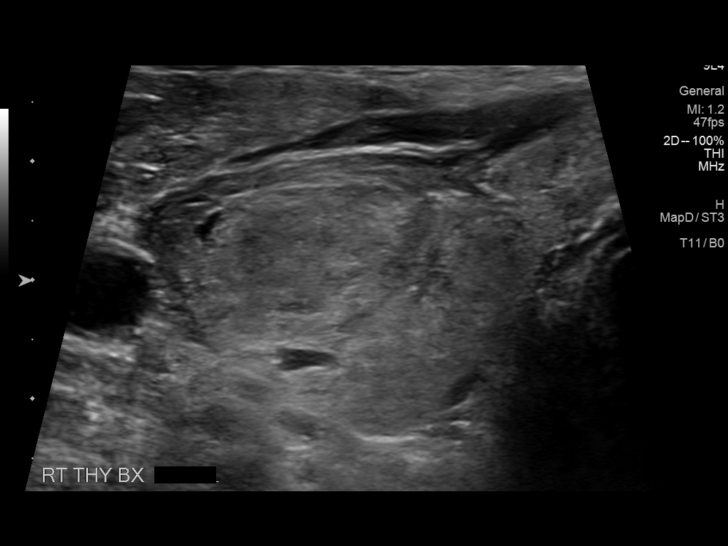

[13 of 14 positions shown; findings below may reference images not displayed]

Pre-procedural ultrasound scanning demonstrated unchanged size and
appearance of the indeterminate nodule within the right thyroid

The procedure was planned. The neck was prepped in the usual sterile
fashion, and a sterile drape was applied covering the operative
field. A timeout was performed prior to the initiation of the
procedure. Local anesthesia was provided with 1% lidocaine.

Under direct ultrasound guidance, 5 FNA biopsies were performed of
the right inferior thyroid nodule with a 25 gauge needle.

2 of these samples were obtained for AFIRMA per ordering SAJO

Multiple ultrasound images were saved for procedural documentation
purposes. The samples were prepared and submitted to pathology.

Limited post procedural scanning was negative for hematoma or
additional complication. Dressings were placed. The patient
tolerated the above procedures procedure well without immediate
postprocedural complication.
FINDINGS: Nodule reference number based on prior diagnostic ultrasound: 5

Maximum size: 2.5 cm

Location: Right; Inferior

ACR TI-RADS risk category: TR4 (4-6 points)

Reason for biopsy: meets ACR TI-RADS criteria

Ultrasound imaging confirms appropriate placement of the needles
within the thyroid nodule.
IMPRESSION: Technically successful ultrasound guided fine needle aspiration of
right inferior thyroid nodule

Read by

Tetelulu Dyndya

## 2020-07-26 ENCOUNTER — Ambulatory Visit (INDEPENDENT_AMBULATORY_CARE_PROVIDER_SITE_OTHER): Payer: Medicare Other | Admitting: Pharmacist

## 2020-07-26 ENCOUNTER — Other Ambulatory Visit: Payer: Self-pay

## 2020-07-26 DIAGNOSIS — Z5181 Encounter for therapeutic drug level monitoring: Secondary | ICD-10-CM

## 2020-07-26 DIAGNOSIS — I359 Nonrheumatic aortic valve disorder, unspecified: Secondary | ICD-10-CM | POA: Diagnosis not present

## 2020-07-26 DIAGNOSIS — I712 Thoracic aortic aneurysm, without rupture, unspecified: Secondary | ICD-10-CM

## 2020-07-26 LAB — POCT INR: INR: 2.3 (ref 2.0–3.0)

## 2020-07-26 NOTE — Patient Instructions (Signed)
Description   Continue taking on same dosage 1 tablet daily except 1/2 tablet on Sundays. Recheck INR in 6 weeks. Call with any questions or new medications #[phone]. Started [place] in January 2021.

## 2020-08-02 ENCOUNTER — Other Ambulatory Visit: Payer: Self-pay

## 2020-08-02 ENCOUNTER — Inpatient Hospital Stay: Payer: Medicare Other | Attending: Hematology & Oncology

## 2020-08-02 ENCOUNTER — Encounter: Payer: Self-pay | Admitting: Hematology & Oncology

## 2020-08-02 ENCOUNTER — Telehealth: Payer: Self-pay

## 2020-08-02 ENCOUNTER — Inpatient Hospital Stay (HOSPITAL_BASED_OUTPATIENT_CLINIC_OR_DEPARTMENT_OTHER): Payer: Medicare Other | Admitting: Hematology & Oncology

## 2020-08-02 VITALS — BP 134/67 | HR 68 | Temp 98.0°F | Resp 20 | Wt 172.0 lb

## 2020-08-02 DIAGNOSIS — D696 Thrombocytopenia, unspecified: Secondary | ICD-10-CM | POA: Insufficient documentation

## 2020-08-02 DIAGNOSIS — D469 Myelodysplastic syndrome, unspecified: Secondary | ICD-10-CM

## 2020-08-02 DIAGNOSIS — D72819 Decreased white blood cell count, unspecified: Secondary | ICD-10-CM | POA: Insufficient documentation

## 2020-08-02 DIAGNOSIS — D709 Neutropenia, unspecified: Secondary | ICD-10-CM

## 2020-08-02 DIAGNOSIS — Z7901 Long term (current) use of anticoagulants: Secondary | ICD-10-CM | POA: Insufficient documentation

## 2020-08-02 DIAGNOSIS — Z952 Presence of prosthetic heart valve: Secondary | ICD-10-CM | POA: Insufficient documentation

## 2020-08-02 LAB — LACTATE DEHYDROGENASE: LDH: 278 U/L — ABNORMAL HIGH (ref 98–192)

## 2020-08-02 LAB — CBC WITH DIFFERENTIAL (CANCER CENTER ONLY)
Abs Immature Granulocytes: 0.01 10*3/uL (ref 0.00–0.07)
Basophils Absolute: 0 10*3/uL (ref 0.0–0.1)
Basophils Relative: 0 %
Eosinophils Absolute: 0.1 10*3/uL (ref 0.0–0.5)
Eosinophils Relative: 2 %
HCT: 37.5 % — ABNORMAL LOW (ref 39.0–52.0)
Hemoglobin: 13.3 g/dL (ref 13.0–17.0)
Immature Granulocytes: 0 %
Lymphocytes Relative: 12 %
Lymphs Abs: 0.3 10*3/uL — ABNORMAL LOW (ref 0.7–4.0)
MCH: 35.5 pg — ABNORMAL HIGH (ref 26.0–34.0)
MCHC: 35.5 g/dL (ref 30.0–36.0)
MCV: 100 fL (ref 80.0–100.0)
Monocytes Absolute: 0.2 10*3/uL (ref 0.1–1.0)
Monocytes Relative: 5 %
Neutro Abs: 2.3 10*3/uL (ref 1.7–7.7)
Neutrophils Relative %: 81 %
Platelet Count: 65 10*3/uL — ABNORMAL LOW (ref 150–400)
RBC: 3.75 MIL/uL — ABNORMAL LOW (ref 4.22–5.81)
RDW: 14.1 % (ref 11.5–15.5)
WBC Count: 2.8 10*3/uL — ABNORMAL LOW (ref 4.0–10.5)
nRBC: 0 % (ref 0.0–0.2)

## 2020-08-02 LAB — SAVE SMEAR(SSMR), FOR PROVIDER SLIDE REVIEW

## 2020-08-02 LAB — IRON AND TIBC
Iron: 134 ug/dL (ref 42–163)
Saturation Ratios: 57 % — ABNORMAL HIGH (ref 20–55)
TIBC: 234 ug/dL (ref 202–409)
UIBC: 100 ug/dL — ABNORMAL LOW (ref 117–376)

## 2020-08-02 LAB — FERRITIN: Ferritin: 639 ng/mL — ABNORMAL HIGH (ref 24–336)

## 2020-08-02 NOTE — Progress Notes (Signed)
Hematology and Oncology Follow Up Visit  Patrick [name] [id number] Jan 02, 1941 80 y.o. 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  As always, he is the consummate world traveler.  He was down in [place].  He was recently in [place].  He has next going to [place].  He works for [employer].  He is incredibly well-known.  He does travel and work for a lot of [employer].  He really is quite busy.  He is doing well on Coumadin.  He has mechanical heart valve.  He has had no problems with bleeding.  Cardiology follows his INR.  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  His blood counts been holding steady.  He has had no bleeding.  Is been no bruising.  He has had no fever.  Despite all the travels, he has not had COVID.  There is no cough or shortness of breath.  He has had limited congestion recently.  He is had no change in bowel or bladder habits.  Overall, his performance status is ECOG 0.     Medications:  Current Outpatient Medications:    amLODipine (NORVASC) 5 MG tablet, Take 1 tablet (5 mg total) by mouth daily., Disp: 90 tablet, Rfl: 3   atorvastatin (LIPITOR) 10 MG tablet, TAKE ONE TABLET BY MOUTH ONE TIME DAILY, Disp: 90 tablet, Rfl: 1   pantoprazole (PROTONIX) 40 MG tablet, Take 1 tablet (40 mg total) by mouth daily., Disp: 90 tablet, Rfl: 3   SUMAtriptan (IMITREX) 25 MG tablet, Take 1 tablet (25 mg total) by mouth every 2 (two) hours as needed for migraine. May repeat in 2 hours if headache persists or recurs., Disp: 12 tablet, Rfl: 5   tadalafil (CIALIS) 5 MG tablet, Take 1 tablet (5 mg total) by mouth daily., Disp: 30 tablet, Rfl: 3   warfarin (JANTOVEN) 7.5 MG tablet, TAKE ONE-HALF TO ONE TABLET BY MOUTH ONE TIME DAILY OR AS DIRECTED BY THE ANTICOAGULATION CLINIC, Disp: 100 tablet, Rfl: 1    amoxicillin (AMOXIL) 500 MG tablet, Take 4 tablets by mouth one hour prior to dental appointments (Patient not taking: No sig reported), Disp: 20 tablet, Rfl: 1   desonide (DESOWEN) 0.05 % cream, Apply 1 application topically daily as needed. (Patient not taking: Reported on 08/02/2020), Disp: 60 g, Rfl: 3   diphenoxylate-atropine (LOMOTIL) 2.5-0.025 MG tablet, Take 1 tablet by mouth 4 (four) times daily as needed for diarrhea or loose stools. (Patient not taking: Reported on 08/02/2020), Disp: 60 tablet, Rfl: 1   triamcinolone cream (KENALOG) 0.1 %, Apply 1 application topically as needed.  (Patient not taking: Reported on 08/02/2020), Disp: , Rfl:   Allergies:  Allergies  Allergen Reactions   Codeine Rash   Terbinafine Itching    Past Medical History, Surgical history, Social history, and Family History were reviewed and updated.  Review of Systems: Review of Systems  Constitutional: Negative.   HENT:  Negative.    Eyes: Negative.   Respiratory: Negative.    Cardiovascular: Negative.   Gastrointestinal: Negative.   Endocrine: Negative.   Genitourinary: Negative.    Musculoskeletal: Negative.   Skin: Negative.   Neurological: Negative.   Hematological: Negative.   Psychiatric/Behavioral: Negative.     Physical Exam:  weight is 172 lb (78 kg). His oral temperature is 98 F (36.7  C). His blood pressure is 134/67 and his pulse is 68. His respiration is 20 and oxygen saturation is 97%.   Wt Readings from Last 3 Encounters:  08/02/20 172 lb (78 kg)  05/01/20 176 lb 4 oz (79.9 kg)  04/02/20 178 lb 3.2 oz (80.8 kg)    Physical Exam Vitals reviewed.  HENT:     Head: Normocephalic and atraumatic.  Eyes:     Pupils: Pupils are equal, round, and reactive to light.  Cardiovascular:     Rate and Rhythm: Normal rate and regular rhythm.     Heart sounds: Normal heart sounds.     Comments: Cardiac exam shows a regular rate and rhythm.  He does have a systolic click from his mechanical  cardiac valve.  I hear no murmurs or rubs or bruits. Pulmonary:     Effort: Pulmonary effort is normal.     Breath sounds: Normal breath sounds.  Abdominal:     General: Bowel sounds are normal.     Palpations: Abdomen is soft.  Musculoskeletal:        General: No tenderness or deformity. Normal range of motion.     Cervical back: Normal range of motion.  Lymphadenopathy:     Cervical: No cervical adenopathy.  Skin:    General: Skin is warm and dry.     Findings: No erythema or rash.  Neurological:     Mental Status: He is alert and oriented to person, place, and time.  Psychiatric:        Behavior: Behavior normal.        Thought Content: Thought content normal.        Judgment: Judgment normal.     Lab Results  Component Value Date   WBC 2.8 (L) 08/02/2020   HGB 13.3 08/02/2020   HCT 37.5 (L) 08/02/2020   MCV 100.0 08/02/2020   PLT 65 (L) 08/02/2020     Chemistry      Component Value Date/Time   NA 140 05/01/2020 1128   NA 140 11/18/2018 1000   K 4.8 05/01/2020 1128   CL 105 05/01/2020 1128   CO2 32 05/01/2020 1128   BUN 17 05/01/2020 1128   BUN 13 11/18/2018 1000   CREATININE 1.24 05/01/2020 1128      Component Value Date/Time   CALCIUM 10.3 05/01/2020 1128   ALKPHOS 79 05/01/2020 1128   AST 19 05/01/2020 1128   ALT 21 05/01/2020 1128   BILITOT 0.8 05/01/2020 1128       Impression and Plan: Patrick [name] is a 80 year old white male.  He has mild leukopenia and thrombocytopenia.  I looked at his blood smear.  I really do not see anything that looked suspicious on the blood smear.  I still think that myelodysplasia is a possibility.  Again, he is not symptomatic.  His blood counts are holding pretty stable.  I am sure that at some point, we probably will need to do a bone marrow test on him.  I do not think that we would be changing our recommendations right now as he is asymptomatic.  We will plan to get her back in 3 more months.  We will plan to get him  through the summertime.  I love to talk to about his travels.  I cannot wait to talk to him about [place] and wherever else he travels to.   Patrick Napoleon, MD 6/9/202210:32 AM

## 2020-08-02 NOTE — Telephone Encounter (Signed)
Appt made per 08/02/20 los and pt to gain sch at checkout and through [employer]

## 2020-08-05 ENCOUNTER — Other Ambulatory Visit: Payer: Self-pay | Admitting: Internal Medicine

## 2020-08-20 ENCOUNTER — Ambulatory Visit: Payer: Medicare Other | Admitting: Urology

## 2020-08-28 ENCOUNTER — Telehealth: Payer: Self-pay | Admitting: Cardiovascular Disease

## 2020-08-28 DIAGNOSIS — I359 Nonrheumatic aortic valve disorder, unspecified: Secondary | ICD-10-CM

## 2020-08-28 NOTE — Telephone Encounter (Signed)
Patient called today and states he having fatigue and symptoms similar to when he found out he had a leaky heart valve. He wanted to come in to see Dr. Angelena Form but was "disgusted" and thought it was "completely unacceptable" that Dr. Angelena Form did not have any availability until the end of December.   Patient wants to speak to Dr. Camillia Herter Nurse today

## 2020-08-28 NOTE — Telephone Encounter (Signed)
I spoke with the patient. He is feeling like he gives out a lot sooner than normal.  "Cant do much, anything requires exertion" used to work in the yard 20 min before getting tired.  Now 2 min and he has to stop.   Describes all this as "how I was when I had to have my valve fixed."  Last echo was May 2018. He is due back in clinic in Oct but can see early in July.  He has been noticing this as very slow progression.  "I should have called 6 months ago but I put it off because it might just be me getting older."  Last CBC w Dr/ Marin Olp on 6/9.  Hemoglobin normal.   Pt aware I will forward to Dr. Angelena Form and will call him back to schedule follow up/echo if recommended.

## 2020-08-29 ENCOUNTER — Other Ambulatory Visit: Payer: Self-pay

## 2020-08-29 ENCOUNTER — Ambulatory Visit (INDEPENDENT_AMBULATORY_CARE_PROVIDER_SITE_OTHER): Payer: Medicare Other | Admitting: Urology

## 2020-08-29 ENCOUNTER — Encounter: Payer: Self-pay | Admitting: Urology

## 2020-08-29 VITALS — BP 168/75 | HR 57 | Ht 72.0 in | Wt 170.0 lb

## 2020-08-29 DIAGNOSIS — N401 Enlarged prostate with lower urinary tract symptoms: Secondary | ICD-10-CM | POA: Diagnosis not present

## 2020-08-29 DIAGNOSIS — N529 Male erectile dysfunction, unspecified: Secondary | ICD-10-CM

## 2020-08-29 DIAGNOSIS — N138 Other obstructive and reflux uropathy: Secondary | ICD-10-CM | POA: Diagnosis not present

## 2020-08-29 LAB — BLADDER SCAN AMB NON-IMAGING: Scan Result: 1

## 2020-08-29 MED ORDER — TADALAFIL 5 MG PO TABS: 5.0000 mg | ORAL_TABLET | Freq: Every day | ORAL | 4 refills | Status: DC

## 2020-08-29 NOTE — Telephone Encounter (Signed)
Called and scheduled pt for echo and follow up with Dr. Angelena Form.  He is appreciative for assistance.

## 2020-08-29 NOTE — Progress Notes (Signed)
   08/29/2020 2:34 PM   [name] 10-Jul-1940 [id number]  Reason for visit: Follow up BPH, ED  HPI: I saw [name] in urology clinic for follow-up of the above issues.  He is a 80 year old male with mechanical valve on Coumadin who underwent a PAE in February 2020 at [facility] for a 120 g prostate.  He continues to do very well from a urinary perspective since this procedure and is voiding well with a strong stream and not having any significant nocturia.  He denies any urinary complaints today.  PVR today is normal at 1 mL.  He is taking 5 mg Cialis daily which he feels improves the strength of his urinary stream.  He has erectile dysfunction, but is not getting benefit from the 5 mg Cialis daily.  We discussed the max dose is 20 mg and this could be taken as needed to see if it improves his erections.  Risks and benefits discussed.  He is not interested in other treatments for erectile dysfunction at this point.  Cialis refilled, risks and benefits discussed RTC 1 year with PVR  Patrick Co, MD  [facility] [address] [place], [place] [address] [phone]

## 2020-09-02 ENCOUNTER — Other Ambulatory Visit: Payer: Self-pay | Admitting: Internal Medicine

## 2020-09-04 ENCOUNTER — Ambulatory Visit (INDEPENDENT_AMBULATORY_CARE_PROVIDER_SITE_OTHER): Payer: Medicare Other

## 2020-09-04 ENCOUNTER — Other Ambulatory Visit: Payer: Self-pay

## 2020-09-04 VITALS — BP 110/70 | HR 67 | Temp 98.3°F | Ht 72.0 in | Wt 173.8 lb

## 2020-09-04 DIAGNOSIS — Z0000 Encounter for general adult medical examination without abnormal findings: Secondary | ICD-10-CM | POA: Diagnosis not present

## 2020-09-04 DIAGNOSIS — Z1211 Encounter for screening for malignant neoplasm of colon: Secondary | ICD-10-CM
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
## 2020-09-04 NOTE — Patient Instructions (Addendum)
Patrick [name] , Thank you for taking time to come for your Medicare Wellness Visit. I appreciate your ongoing commitment to your health goals. Please review the following plan we discussed and let me know if I can assist you in the future.   Screening recommendations/referrals: Colonoscopy: last done 10/16/2014; due every 5 years Recommended yearly ophthalmology/optometry visit for glaucoma screening and checkup Recommended yearly dental visit for hygiene and checkup  Vaccinations: Influenza vaccine: 01/07/2020 Pneumococcal vaccine: 11/26/2012, 05/08/2016 Tdap vaccine: 09/18/2009; due every 10 years (OVERDUE), not covered by Medicare as preventative but will cover as treatment for an injury. Shingles vaccine: Please call your insurance company to determine your out of pocket expense for the Shingrix vaccine. You may receive this vaccine at your local pharmacy.   Covid-19: 03/12/2019, 04/02/2019, 02/19/2020  Advanced directives: Please bring a copy of your health care power of attorney and living will to the office at your convenience.  Conditions/risks identified: Client understands the importance of follow-up with providers by attending scheduled visits and discussed goals to eat healthier, increase physical activity, exercise the brain, socialize more,  get enough rest and make time for laughter.  Next appointment: Please schedule your next Medicare Wellness Visit with your Nurse Health Advisor in 1 year by calling [phone].  Preventive Care 66 Years and Older, Male Preventive care refers to lifestyle choices and visits with your health care provider that can promote health and wellness. What does preventive care include? A yearly physical exam. This is also called an annual well check. Dental exams once or twice a year. Routine eye exams. Ask your health care provider how often you should have your eyes checked. Personal lifestyle choices, including: Daily care of your teeth and  gums. Regular physical activity. Eating a healthy diet. Avoiding tobacco and drug use. Limiting alcohol use. Practicing safe sex. Taking low doses of aspirin every day. Taking vitamin and mineral supplements as recommended by your health care provider. What happens during an annual well check? The services and screenings done by your health care provider during your annual well check will depend on your age, overall health, lifestyle risk factors, and family history of disease. Counseling  Your health care provider may ask you questions about your: Alcohol use. Tobacco use. Drug use. Emotional well-being. Home and relationship well-being. Sexual activity. Eating habits. History of falls. Memory and ability to understand (cognition). Work and work Statistician. Screening  You may have the following tests or measurements: Height, weight, and BMI. Blood pressure. Lipid and cholesterol levels. These may be checked every 5 years, or more frequently if you are over 109 years old. Skin check. Lung cancer screening. You may have this screening every year starting at age 64 if you have a 30-pack-year history of smoking and currently smoke or have quit within the past 15 years. Fecal occult blood test (FOBT) of the stool. You may have this test every year starting at age 52. Flexible sigmoidoscopy or colonoscopy. You may have a sigmoidoscopy every 5 years or a colonoscopy every 10 years starting at age 37. Prostate cancer screening. Recommendations will vary depending on your family history and other risks. Hepatitis C blood test. Hepatitis B blood test. Sexually transmitted disease (STD) testing. Diabetes screening. This is done by checking your blood sugar (glucose) after you have not eaten for a while (fasting). You may have this done every 1-3 years. Abdominal aortic aneurysm (AAA) screening. You may need this if you are a current or former smoker. Osteoporosis. You may  be screened  starting at age 2 if you are at high risk. Talk with your health care provider about your test results, treatment options, and if necessary, the need for more tests. Vaccines  Your health care provider may recommend certain vaccines, such as: Influenza vaccine. This is recommended every year. Tetanus, diphtheria, and acellular pertussis (Tdap, Td) vaccine. You may need a Td booster every 10 years. Zoster vaccine. You may need this after age 10. Pneumococcal 13-valent conjugate (PCV13) vaccine. One dose is recommended after age 89. Pneumococcal polysaccharide (PPSV23) vaccine. One dose is recommended after age 82. Talk to your health care provider about which screenings and vaccines you need and how often you need them. This information is not intended to replace advice given to you by your health care provider. Make sure you discuss any questions you have with your health care provider. Document Released: 03/09/2015 Document Revised: 10/31/2015 Document Reviewed: 12/12/2014 Elsevier Interactive Patient Education  2017 [place] Prevention in the Home Falls can cause injuries. They can happen to people of all ages. There are many things you can do to make your home safe and to help prevent falls. What can I do on the outside of my home? Regularly fix the edges of walkways and driveways and fix any cracks. Remove anything that might make you trip as you walk through a door, such as a raised step or threshold. Trim any bushes or trees on the path to your home. Use bright outdoor lighting. Clear any walking paths of anything that might make someone trip, such as rocks or tools. Regularly check to see if handrails are loose or broken. Make sure that both sides of any steps have handrails. Any raised decks and porches should have guardrails on the edges. Have any leaves, snow, or ice cleared regularly. Use sand or salt on walking paths during winter. Clean up any spills in your garage  right away. This includes oil or grease spills. What can I do in the bathroom? Use night lights. Install grab bars by the toilet and in the tub and shower. Do not use towel bars as grab bars. Use non-skid mats or decals in the tub or shower. If you need to sit down in the shower, use a plastic, non-slip stool. Keep the floor dry. Clean up any water that spills on the floor as soon as it happens. Remove soap buildup in the tub or shower regularly. Attach bath mats securely with double-sided non-slip rug tape. Do not have throw rugs and other things on the floor that can make you trip. What can I do in the bedroom? Use night lights. Make sure that you have a light by your bed that is easy to reach. Do not use any sheets or blankets that are too big for your bed. They should not hang down onto the floor. Have a firm chair that has side arms. You can use this for support while you get dressed. Do not have throw rugs and other things on the floor that can make you trip. What can I do in the kitchen? Clean up any spills right away. Avoid walking on wet floors. Keep items that you use a lot in easy-to-reach places. If you need to reach something above you, use a strong step stool that has a grab bar. Keep electrical cords out of the way. Do not use floor polish or wax that makes floors slippery. If you must use wax, use non-skid floor wax. Do not  have throw rugs and other things on the floor that can make you trip. What can I do with my stairs? Do not leave any items on the stairs. Make sure that there are handrails on both sides of the stairs and use them. Fix handrails that are broken or loose. Make sure that handrails are as long as the stairways. Check any carpeting to make sure that it is firmly attached to the stairs. Fix any carpet that is loose or worn. Avoid having throw rugs at the top or bottom of the stairs. If you do have throw rugs, attach them to the floor with carpet tape. Make  sure that you have a light switch at the top of the stairs and the bottom of the stairs. If you do not have them, ask someone to add them for you. What else can I do to help prevent falls? Wear shoes that: Do not have high heels. Have rubber bottoms. Are comfortable and fit you well. Are closed at the toe. Do not wear sandals. If you use a stepladder: Make sure that it is fully opened. Do not climb a closed stepladder. Make sure that both sides of the stepladder are locked into place. Ask someone to hold it for you, if possible. Clearly mark and make sure that you can see: Any grab bars or handrails. First and last steps. Where the edge of each step is. Use tools that help you move around (mobility aids) if they are needed. These include: Canes. Walkers. Scooters. Crutches. Turn on the lights when you go into a dark area. Replace any light bulbs as soon as they burn out. Set up your furniture so you have a clear path. Avoid moving your furniture around. If any of your floors are uneven, fix them. If there are any pets around you, be aware of where they are. Review your medicines with your doctor. Some medicines can make you feel dizzy. This can increase your chance of falling. Ask your doctor what other things that you can do to help prevent falls. This information is not intended to replace advice given to you by your health care provider. Make sure you discuss any questions you have with your health care provider. Document Released: 12/07/2008 Document Revised: 07/19/2015 Document Reviewed: 03/17/2014 Elsevier Interactive Patient Education  2017 [employer].

## 2020-09-05 ENCOUNTER — Ambulatory Visit: Payer: Medicare Other

## 2020-09-06 ENCOUNTER — Ambulatory Visit (INDEPENDENT_AMBULATORY_CARE_PROVIDER_SITE_OTHER): Payer: Medicare Other

## 2020-09-06 ENCOUNTER — Other Ambulatory Visit: Payer: Self-pay

## 2020-09-06 DIAGNOSIS — I712 Thoracic aortic aneurysm, without rupture, unspecified: Secondary | ICD-10-CM

## 2020-09-06 DIAGNOSIS — Z5181 Encounter for therapeutic drug level monitoring: Secondary | ICD-10-CM | POA: Diagnosis not present

## 2020-09-06 DIAGNOSIS — I359 Nonrheumatic aortic valve disorder, unspecified: Secondary | ICD-10-CM

## 2020-09-06 LAB — POCT INR: INR: 2.5 (ref 2.0–3.0)

## 2020-09-06 NOTE — Patient Instructions (Signed)
Description   Continue taking on same dosage 1 tablet daily except 1/2 tablet on Sundays. Recheck INR in 6 weeks. Call with any questions or new medications #[phone].

## 2020-09-08 ENCOUNTER — Other Ambulatory Visit: Payer: Self-pay | Admitting: Internal Medicine

## 2020-09-19 ENCOUNTER — Ambulatory Visit (HOSPITAL_COMMUNITY): Payer: Medicare Other | Attending: Internal Medicine

## 2020-09-19 ENCOUNTER — Other Ambulatory Visit: Payer: Self-pay

## 2020-09-19 DIAGNOSIS — I359 Nonrheumatic aortic valve disorder, unspecified: Secondary | ICD-10-CM

## 2020-09-19 LAB — ECHOCARDIOGRAM COMPLETE
AV Mean grad: 15 mmHg
AV Peak grad: 29.5 mmHg
Ao pk vel: 2.72 m/s
Area-P 1/2: 1.84 cm2
S' Lateral: 3 cm

## 2020-09-21 ENCOUNTER — Encounter: Payer: Self-pay | Admitting: Cardiovascular Disease

## 2020-09-21 ENCOUNTER — Other Ambulatory Visit: Payer: Self-pay

## 2020-09-21 ENCOUNTER — Ambulatory Visit (INDEPENDENT_AMBULATORY_CARE_PROVIDER_SITE_OTHER): Payer: Medicare Other | Admitting: Cardiovascular Disease

## 2020-09-21 VITALS — BP 136/62 | HR 63 | Ht 72.0 in | Wt 176.2 lb

## 2020-09-21 DIAGNOSIS — I712 Thoracic aortic aneurysm, without rupture, unspecified: Secondary | ICD-10-CM

## 2020-09-21 DIAGNOSIS — I10 Essential (primary) hypertension: Secondary | ICD-10-CM | POA: Diagnosis not present

## 2020-09-21 DIAGNOSIS — I714 Abdominal aortic aneurysm, without rupture, unspecified: Secondary | ICD-10-CM

## 2020-09-21 DIAGNOSIS — I359 Nonrheumatic aortic valve disorder, unspecified: Secondary | ICD-10-CM | POA: Diagnosis not present
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
## 2020-09-21 NOTE — Patient Instructions (Addendum)
Medication Instructions:  Your physician recommends that you continue on your current medications as directed. Please refer to the Current Medication list given to you today.  *If you need a refill on your cardiac medications before your next appointment, please call your pharmacy*   Lab Work: NONE If you have labs (blood work) drawn today and your tests are completely normal, you will receive your results only by: [place] (if you have MyChart) OR A paper copy in the mail If you have any lab test that is abnormal or we need to change your treatment, we will call you to review the results.   Testing/Procedures: NONE   Follow-Up: At [facility], you and your health needs are our priority.  As part of our continuing mission to provide you with exceptional heart care, we have created designated Provider Care Teams.  These Care Teams include your primary Cardiologist (physician) and Advanced Practice Providers (APPs -  Physician Assistants and Nurse Practitioners) who all work together to provide you with the care you need, when you need it.    Your next appointment:   1 year(s)  The format for your next appointment:   In Person  Provider:   You may see Lauree Chandler, MD or one of the following Advanced Practice Providers on your designated Care Team:   Melina Copa, PA-C Ermalinda Barrios, PA-C

## 2020-09-24 DIAGNOSIS — Z1211 Encounter for screening for malignant neoplasm of colon: Secondary | ICD-10-CM | POA: Diagnosis not present

## 2020-09-28 LAB — COLOGUARD: Cologuard: NEGATIVE

## 2020-10-02 ENCOUNTER — Other Ambulatory Visit: Payer: Self-pay | Admitting: Cardiovascular Disease

## 2020-10-16 ENCOUNTER — Other Ambulatory Visit: Payer: Self-pay | Admitting: Cardiovascular Disease

## 2020-10-18 DIAGNOSIS — L570 Actinic keratosis: Secondary | ICD-10-CM | POA: Diagnosis not present

## 2020-10-18 DIAGNOSIS — Z85828 Personal history of other malignant neoplasm of skin: Secondary | ICD-10-CM | POA: Diagnosis not present

## 2020-10-22 ENCOUNTER — Ambulatory Visit (INDEPENDENT_AMBULATORY_CARE_PROVIDER_SITE_OTHER): Payer: Medicare Other

## 2020-10-22 ENCOUNTER — Other Ambulatory Visit: Payer: Self-pay

## 2020-10-22 DIAGNOSIS — I712 Thoracic aortic aneurysm, without rupture, unspecified: Secondary | ICD-10-CM

## 2020-10-22 DIAGNOSIS — I359 Nonrheumatic aortic valve disorder, unspecified: Secondary | ICD-10-CM

## 2020-10-22 DIAGNOSIS — Z5181 Encounter for therapeutic drug level monitoring: Secondary | ICD-10-CM | POA: Diagnosis not present

## 2020-10-22 LAB — POCT INR: INR: 1.7 — AB (ref 2.0–3.0)

## 2020-10-22 NOTE — Patient Instructions (Signed)
-   take extra 1/2 tablet warfarin tonight, then - Continue taking on same dosage 1 tablet daily except 1/2 tablet on Sundays.  - Recheck INR in 6 weeks.  Call with any questions or new medications #[phone].

## 2020-10-25 DIAGNOSIS — Z20822 Contact with and (suspected) exposure to covid-19: Secondary | ICD-10-CM | POA: Diagnosis not present

## 2020-11-01 ENCOUNTER — Encounter: Payer: Self-pay | Admitting: Hematology & Oncology

## 2020-11-08 ENCOUNTER — Ambulatory Visit: Payer: Medicare Other | Admitting: Hematology & Oncology

## 2020-11-08 ENCOUNTER — Other Ambulatory Visit: Payer: Medicare Other

## 2020-11-11 ENCOUNTER — Other Ambulatory Visit: Payer: Self-pay | Admitting: Internal Medicine

## 2020-11-12 ENCOUNTER — Other Ambulatory Visit: Payer: Medicare Other | Admitting: *Deleted

## 2020-11-12 ENCOUNTER — Other Ambulatory Visit: Payer: Self-pay

## 2020-11-12 DIAGNOSIS — I712 Thoracic aortic aneurysm, without rupture, unspecified: Secondary | ICD-10-CM

## 2020-11-12 LAB — BASIC METABOLIC PANEL
BUN/Creatinine Ratio: 14 (ref 10–24)
BUN: 16 mg/dL (ref 8–27)
CO2: 25 mmol/L (ref 20–29)
Calcium: 9.2 mg/dL (ref 8.6–10.2)
Chloride: 103 mmol/L (ref 96–106)
Creatinine, Ser: 1.15 mg/dL (ref 0.76–1.27)
Glucose: 108 mg/dL — ABNORMAL HIGH (ref 65–99)
Potassium: 4.1 mmol/L (ref 3.5–5.2)
Sodium: 139 mmol/L (ref 134–144)
eGFR: 65 mL/min/{1.73_m2} (ref >59)

## 2020-11-13 ENCOUNTER — Encounter (HOSPITAL_COMMUNITY): Payer: Self-pay

## 2020-11-13 ENCOUNTER — Ambulatory Visit (HOSPITAL_COMMUNITY)
Admission: RE | Admit: 2020-11-13 | Discharge: 2020-11-13 | Disposition: A | Discharge status: Home / Self Care | Payer: Medicare Other | Source: Ambulatory Visit | Attending: Cardiovascular Disease | Admitting: Cardiovascular Disease

## 2020-11-13 DIAGNOSIS — I712 Thoracic aortic aneurysm, without rupture, unspecified: Secondary | ICD-10-CM

## 2020-11-13 DIAGNOSIS — E041 Nontoxic single thyroid nodule: Secondary | ICD-10-CM | POA: Diagnosis not present

## 2020-11-13 DIAGNOSIS — I517 Cardiomegaly: Secondary | ICD-10-CM | POA: Diagnosis not present

## 2020-11-13 DIAGNOSIS — J9811 Atelectasis: Secondary | ICD-10-CM | POA: Diagnosis not present

## 2020-11-13 MED ORDER — IOHEXOL 350 MG/ML SOLN
100.0000 mL | Freq: Once | INTRAVENOUS | Status: AC | PRN
  Administered 2020-11-13: 80 mL via INTRAVENOUS

## 2020-11-16 ENCOUNTER — Other Ambulatory Visit: Payer: Self-pay

## 2020-11-16 ENCOUNTER — Inpatient Hospital Stay (HOSPITAL_BASED_OUTPATIENT_CLINIC_OR_DEPARTMENT_OTHER): Payer: Medicare Other | Admitting: Hematology & Oncology

## 2020-11-16 ENCOUNTER — Encounter: Payer: Self-pay | Admitting: Hematology & Oncology

## 2020-11-16 ENCOUNTER — Inpatient Hospital Stay: Payer: Medicare Other | Attending: Hematology & Oncology

## 2020-11-16 VITALS — BP 146/65 | HR 55 | Temp 97.8°F | Resp 16 | Wt 173.0 lb

## 2020-11-16 DIAGNOSIS — D696 Thrombocytopenia, unspecified: Secondary | ICD-10-CM

## 2020-11-16 DIAGNOSIS — Z7901 Long term (current) use of anticoagulants: Secondary | ICD-10-CM | POA: Diagnosis not present

## 2020-11-16 DIAGNOSIS — Z79899 Other long term (current) drug therapy: Secondary | ICD-10-CM | POA: Insufficient documentation

## 2020-11-16 DIAGNOSIS — D708 Other neutropenia: Secondary | ICD-10-CM

## 2020-11-16 DIAGNOSIS — D72819 Decreased white blood cell count, unspecified: Secondary | ICD-10-CM | POA: Insufficient documentation

## 2020-11-16 LAB — CBC WITH DIFFERENTIAL (CANCER CENTER ONLY)
Abs Immature Granulocytes: 0.01 10*3/uL (ref 0.00–0.07)
Basophils Absolute: 0 10*3/uL (ref 0.0–0.1)
Basophils Relative: 0 %
Eosinophils Absolute: 0 10*3/uL (ref 0.0–0.5)
Eosinophils Relative: 1 %
HCT: 38.9 % — ABNORMAL LOW (ref 39.0–52.0)
Hemoglobin: 13.8 g/dL (ref 13.0–17.0)
Immature Granulocytes: 0 %
Lymphocytes Relative: 16 %
Lymphs Abs: 0.4 10*3/uL — ABNORMAL LOW (ref 0.7–4.0)
MCH: 35.3 pg — ABNORMAL HIGH (ref 26.0–34.0)
MCHC: 35.5 g/dL (ref 30.0–36.0)
MCV: 99.5 fL (ref 80.0–100.0)
Monocytes Absolute: 0.2 10*3/uL (ref 0.1–1.0)
Monocytes Relative: 9 %
Neutro Abs: 1.7 10*3/uL (ref 1.7–7.7)
Neutrophils Relative %: 74 %
Platelet Count: 58 10*3/uL — ABNORMAL LOW (ref 150–400)
RBC: 3.91 MIL/uL — ABNORMAL LOW (ref 4.22–5.81)
RDW: 14.4 % (ref 11.5–15.5)
WBC Count: 2.4 10*3/uL — ABNORMAL LOW (ref 4.0–10.5)
nRBC: 0 % (ref 0.0–0.2)

## 2020-11-16 LAB — CMP (CANCER CENTER ONLY)
ALT: 21 U/L (ref 0–44)
AST: 18 U/L (ref 15–41)
Albumin: 4.3 g/dL (ref 3.5–5.0)
Alkaline Phosphatase: 78 U/L (ref 38–126)
Anion gap: 5 (ref 5–15)
BUN: 19 mg/dL (ref 8–23)
CO2: 30 mmol/L (ref 22–32)
Calcium: 9.4 mg/dL (ref 8.9–10.3)
Chloride: 105 mmol/L (ref 98–111)
Creatinine: 1.25 mg/dL — ABNORMAL HIGH (ref 0.61–1.24)
GFR, Estimated: 59 mL/min — ABNORMAL LOW (ref >60)
Glucose, Bld: 102 mg/dL — ABNORMAL HIGH (ref 70–99)
Potassium: 4.3 mmol/L (ref 3.5–5.1)
Sodium: 140 mmol/L (ref 135–145)
Total Bilirubin: 0.7 mg/dL (ref 0.3–1.2)
Total Protein: 6.2 g/dL — ABNORMAL LOW (ref 6.5–8.1)

## 2020-11-16 LAB — IRON AND TIBC
Iron: 100 ug/dL (ref 42–163)
Saturation Ratios: 39 % (ref 20–55)
TIBC: 258 ug/dL (ref 202–409)
UIBC: 157 ug/dL (ref 117–376)

## 2020-11-16 LAB — FERRITIN: Ferritin: 484 ng/mL — ABNORMAL HIGH (ref 24–336)

## 2020-11-16 LAB — LACTATE DEHYDROGENASE: LDH: 272 U/L — ABNORMAL HIGH (ref 98–192)

## 2020-11-16 LAB — SAVE SMEAR(SSMR), FOR PROVIDER SLIDE REVIEW

## 2020-11-16 NOTE — Progress Notes (Signed)
Hematology and Oncology Follow Up Visit  DEQUANTE TREMAINE [id number] 1940/12/14 80 y.o. 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 Parkey is back for his follow-up.  He is go back over to [place] in December.  I think he is going to [place] and [place].  I think next year, he would go back down to [place].  He is doing pretty well.  Had a CT angiogram of the chest looking at his aorta and aortic valve.  This was done on 11/13/2020.  Everything looked fine without any complication or evidence of aneurysm.  He is on Coumadin.  There has been no problems with bleeding.  He is on this for the mechanical aortic valve.  He does take a ton of supplements.  It is hard to say whether or not any of these supplements are affecting his blood counts.  I told him that there really are no studies that can show [place] that.  He has had no problems with fever.  There is been no issues with COVID that he has obviously had.  There is no change in bowel or bladder habits.  Overall, I would say his performance status is ECOG 0.    Medications:  Current Outpatient Medications:    amLODipine (NORVASC) 5 MG tablet, TAKE ONE TABLET BY MOUTH ONE TIME DAILY, Disp: 90 tablet, Rfl: 3   atorvastatin (LIPITOR) 10 MG tablet, TAKE ONE TABLET BY MOUTH ONE TIME DAILY, Disp: 90 tablet, Rfl: 1   pantoprazole (PROTONIX) 40 MG tablet, TAKE ONE TABLET BY MOUTH ONE TIME DAILY, Disp: 90 tablet, Rfl: 3   SUMAtriptan (IMITREX) 25 MG tablet, Take 1 tablet (25 mg total) by mouth every 2 (two) hours as needed for migraine. May repeat in 2 hours if headache persists or recurs., Disp: 12 tablet, Rfl: 5   tadalafil (CIALIS) 5 MG tablet, Take 1 tablet (5 mg total) by mouth daily., Disp: 90 tablet, Rfl: 4   warfarin (JANTOVEN) 7.5 MG tablet, TAKE ONE-HALF TO ONE TABLET BY MOUTH ONE TIME DAILY OR AS DIRECTED BY THE ANTICOAGULATION CLINIC, Disp: 100 tablet,  Rfl: 1  Allergies:  Allergies  Allergen Reactions   Codeine Rash   Terbinafine Itching    Past Medical History, Surgical history, Social history, and Family History were reviewed and updated.  Review of Systems: Review of Systems  Constitutional: Negative.   HENT:  Negative.    Eyes: Negative.   Respiratory: Negative.    Cardiovascular: Negative.   Gastrointestinal: Negative.   Endocrine: Negative.   Genitourinary: Negative.    Musculoskeletal: Negative.   Skin: Negative.   Neurological: Negative.   Hematological: Negative.   Psychiatric/Behavioral: Negative.     Physical Exam:  weight is 173 lb (78.5 kg). His oral temperature is 97.8 F (36.6 C). His blood pressure is 146/65 (abnormal) and his pulse is 55 (abnormal). His respiration is 16 and oxygen saturation is 99%.   Wt Readings from Last 3 Encounters:  11/16/20 173 lb (78.5 kg)  09/21/20 176 lb 3.2 oz (79.9 kg)  09/04/20 173 lb 12.8 oz (78.8 kg)    Physical Exam Vitals reviewed.  HENT:     Head: Normocephalic and atraumatic.  Eyes:     Pupils: Pupils are equal, round, and reactive to light.  Cardiovascular:     Rate and Rhythm: Normal rate and regular rhythm.     Heart sounds: Normal heart  sounds.     Comments: Cardiac exam shows a regular rate and rhythm.  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  I hear no murmurs or rubs or bruits. Pulmonary:     Effort: Pulmonary effort is normal.     Breath sounds: Normal breath sounds.  Abdominal:     General: Bowel sounds are normal.     Palpations: Abdomen is soft.  Musculoskeletal:        General: No tenderness or deformity. Normal range of motion.     Cervical back: Normal range of motion.  Lymphadenopathy:     Cervical: No cervical adenopathy.  Skin:    General: Skin is warm and dry.     Findings: No erythema or rash.  Neurological:     Mental Status: He is alert and oriented to person, place, and time.  Psychiatric:         Behavior: Behavior normal.        Thought Content: Thought content normal.        Judgment: Judgment normal.     Lab Results  Component Value Date   WBC 2.4 (L) 11/16/2020   HGB 13.8 11/16/2020   HCT 38.9 (L) 11/16/2020   MCV 99.5 11/16/2020   PLT 58 (L) 11/16/2020     Chemistry      Component Value Date/Time   NA 140 11/16/2020 0808   NA 139 11/12/2020 1030   K 4.3 11/16/2020 0808   CL 105 11/16/2020 0808   CO2 30 11/16/2020 0808   BUN 19 11/16/2020 0808   BUN 16 11/12/2020 1030   CREATININE 1.25 (H) 11/16/2020 0808      Component Value Date/Time   CALCIUM 9.4 11/16/2020 0808   ALKPHOS 78 11/16/2020 0808   AST 18 11/16/2020 0808   ALT 21 11/16/2020 0808   BILITOT 0.7 11/16/2020 0808       Impression and Plan: Mr. Weber is a 80 year old white male.  He has mild leukopenia and thrombocytopenia.  I looked at his blood smear.  I really do not see anything that looked suspicious on the blood smear.  I still think that myelodysplasia is a possibility.  Again, he is not symptomatic.  His blood counts are holding pretty stable.  His platelet count might be down a little bit.  Again, he is nonsymptomatic.  Again, return hold off on doing a bone marrow biopsy on him.  Again I suspect that we probably will have to do one at some point.  I would like to give him through the holiday season now.  I think we can get him back in January.  Hopefully he will not be [place].   Volanda Napoleon, MD 9/23/20229:22 AM

## 2020-12-03 ENCOUNTER — Other Ambulatory Visit: Payer: Self-pay | Admitting: Internal Medicine

## 2020-12-04 ENCOUNTER — Other Ambulatory Visit: Payer: Self-pay

## 2020-12-04 ENCOUNTER — Ambulatory Visit (INDEPENDENT_AMBULATORY_CARE_PROVIDER_SITE_OTHER): Payer: Medicare Other

## 2020-12-04 DIAGNOSIS — I712 Thoracic aortic aneurysm, without rupture, unspecified: Secondary | ICD-10-CM | POA: Diagnosis not present

## 2020-12-04 DIAGNOSIS — I359 Nonrheumatic aortic valve disorder, unspecified: Secondary | ICD-10-CM | POA: Diagnosis not present

## 2020-12-04 DIAGNOSIS — Z5181 Encounter for therapeutic drug level monitoring: Secondary | ICD-10-CM

## 2020-12-04 LAB — POCT INR: INR: 2.4 (ref 2.0–3.0)

## 2020-12-04 NOTE — Patient Instructions (Signed)
Description   Continue taking on same dosage 1 tablet daily except 1/2 tablet on Sundays.  Recheck INR in 6 weeks. Call with any questions or new medications #[phone].

## 2020-12-20 DIAGNOSIS — Z20822 Contact with and (suspected) exposure to covid-19: Secondary | ICD-10-CM | POA: Diagnosis not present

## 2020-12-23 DIAGNOSIS — Z23 Encounter for immunization: Secondary | ICD-10-CM | POA: Diagnosis not present

## 2020-12-27 ENCOUNTER — Other Ambulatory Visit: Payer: Self-pay | Admitting: Cardiovascular Disease

## 2020-12-27 DIAGNOSIS — L720 Epidermal cyst: Secondary | ICD-10-CM | POA: Diagnosis not present

## 2020-12-27 DIAGNOSIS — L821 Other seborrheic keratosis: Secondary | ICD-10-CM | POA: Diagnosis not present

## 2020-12-27 DIAGNOSIS — D485 Neoplasm of uncertain behavior of skin: Secondary | ICD-10-CM | POA: Diagnosis not present

## 2020-12-27 DIAGNOSIS — Z85828 Personal history of other malignant neoplasm of skin: Secondary | ICD-10-CM | POA: Diagnosis not present

## 2020-12-27 DIAGNOSIS — L570 Actinic keratosis: Secondary | ICD-10-CM | POA: Diagnosis not present

## 2021-01-10 DIAGNOSIS — Z4802 Encounter for removal of sutures: Secondary | ICD-10-CM | POA: Diagnosis not present

## 2021-01-15 ENCOUNTER — Ambulatory Visit (INDEPENDENT_AMBULATORY_CARE_PROVIDER_SITE_OTHER): Payer: Medicare Other

## 2021-01-15 ENCOUNTER — Other Ambulatory Visit: Payer: Self-pay

## 2021-01-15 DIAGNOSIS — Z5181 Encounter for therapeutic drug level monitoring: Secondary | ICD-10-CM

## 2021-01-15 DIAGNOSIS — I359 Nonrheumatic aortic valve disorder, unspecified: Secondary | ICD-10-CM | POA: Diagnosis not present

## 2021-01-15 DIAGNOSIS — I712 Thoracic aortic aneurysm, without rupture, unspecified: Secondary | ICD-10-CM | POA: Diagnosis not present

## 2021-01-15 LAB — POCT INR: INR: 4.6 — AB (ref 2.0–3.0)

## 2021-01-15 NOTE — Patient Instructions (Addendum)
Description   Skip today and tomorrow's dosage of Warfarin (will complete Flagyl today), then resume same dosage 1 tablet daily except 1/2 tablet on Sundays.  Pt going out of the country will not return until 01/30/21. Recheck INR in 2 weeks (usually 6 weeks). Call with any questions or new medications #[phone].

## 2021-01-31 ENCOUNTER — Other Ambulatory Visit: Payer: Self-pay

## 2021-01-31 ENCOUNTER — Ambulatory Visit (INDEPENDENT_AMBULATORY_CARE_PROVIDER_SITE_OTHER): Payer: Medicare Other

## 2021-01-31 DIAGNOSIS — I359 Nonrheumatic aortic valve disorder, unspecified: Secondary | ICD-10-CM | POA: Diagnosis not present

## 2021-01-31 DIAGNOSIS — I712 Thoracic aortic aneurysm, without rupture, unspecified: Secondary | ICD-10-CM | POA: Diagnosis not present

## 2021-01-31 DIAGNOSIS — Z5181 Encounter for therapeutic drug level monitoring: Secondary | ICD-10-CM

## 2021-01-31 LAB — POCT INR: INR: 2.7 (ref 2.0–3.0)

## 2021-01-31 NOTE — Patient Instructions (Signed)
Description   Continue same dosage 1 tablet daily except 1/2 tablet on Sundays. Recheck INR in 4 weeks. Call with any questions or new medications #[phone].

## 2021-02-21 ENCOUNTER — Other Ambulatory Visit: Payer: Self-pay | Admitting: Cardiovascular Disease

## 2021-02-28 ENCOUNTER — Other Ambulatory Visit: Payer: Self-pay

## 2021-02-28 ENCOUNTER — Ambulatory Visit (INDEPENDENT_AMBULATORY_CARE_PROVIDER_SITE_OTHER): Payer: Medicare Other

## 2021-02-28 DIAGNOSIS — Z5181 Encounter for therapeutic drug level monitoring: Secondary | ICD-10-CM | POA: Diagnosis not present

## 2021-02-28 DIAGNOSIS — I712 Thoracic aortic aneurysm, without rupture, unspecified: Secondary | ICD-10-CM | POA: Diagnosis not present

## 2021-02-28 DIAGNOSIS — I359 Nonrheumatic aortic valve disorder, unspecified: Secondary | ICD-10-CM | POA: Diagnosis not present

## 2021-02-28 LAB — POCT INR: INR: 2.4 (ref 2.0–3.0)

## 2021-02-28 NOTE — Patient Instructions (Signed)
Description   Continue same dosage 1 tablet daily except 1/2 tablet on Sundays. Recheck INR in 6 weeks. Call with any questions or new medications #[phone].

## 2021-03-01 ENCOUNTER — Encounter: Payer: Self-pay | Admitting: Internal Medicine

## 2021-03-01 ENCOUNTER — Inpatient Hospital Stay (HOSPITAL_BASED_OUTPATIENT_CLINIC_OR_DEPARTMENT_OTHER): Payer: Medicare Other | Admitting: Hematology & Oncology

## 2021-03-01 ENCOUNTER — Other Ambulatory Visit: Payer: Self-pay

## 2021-03-01 ENCOUNTER — Inpatient Hospital Stay: Payer: Medicare Other | Attending: Hematology & Oncology

## 2021-03-01 ENCOUNTER — Encounter: Payer: Self-pay | Admitting: Hematology & Oncology

## 2021-03-01 VITALS — BP 152/79 | HR 64 | Temp 98.2°F | Resp 19 | Wt 172.0 lb

## 2021-03-01 DIAGNOSIS — D708 Other neutropenia: Secondary | ICD-10-CM

## 2021-03-01 DIAGNOSIS — D696 Thrombocytopenia, unspecified: Secondary | ICD-10-CM | POA: Insufficient documentation

## 2021-03-01 DIAGNOSIS — I714 Abdominal aortic aneurysm, without rupture, unspecified: Secondary | ICD-10-CM

## 2021-03-01 DIAGNOSIS — D72819 Decreased white blood cell count, unspecified: Secondary | ICD-10-CM | POA: Diagnosis not present

## 2021-03-01 LAB — CBC WITH DIFFERENTIAL (CANCER CENTER ONLY)
Abs Immature Granulocytes: 0.02 10*3/uL (ref 0.00–0.07)
Basophils Absolute: 0 10*3/uL (ref 0.0–0.1)
Basophils Relative: 0 %
Eosinophils Absolute: 0 10*3/uL (ref 0.0–0.5)
Eosinophils Relative: 1 %
HCT: 39 % (ref 39.0–52.0)
Hemoglobin: 13.8 g/dL (ref 13.0–17.0)
Immature Granulocytes: 1 %
Lymphocytes Relative: 17 %
Lymphs Abs: 0.4 10*3/uL — ABNORMAL LOW (ref 0.7–4.0)
MCH: 35.6 pg — ABNORMAL HIGH (ref 26.0–34.0)
MCHC: 35.4 g/dL (ref 30.0–36.0)
MCV: 100.5 fL — ABNORMAL HIGH (ref 80.0–100.0)
Monocytes Absolute: 0.2 10*3/uL (ref 0.1–1.0)
Monocytes Relative: 10 %
Neutro Abs: 1.7 10*3/uL (ref 1.7–7.7)
Neutrophils Relative %: 71 %
Platelet Count: 62 10*3/uL — ABNORMAL LOW (ref 150–400)
RBC: 3.88 MIL/uL — ABNORMAL LOW (ref 4.22–5.81)
RDW: 14.3 % (ref 11.5–15.5)
WBC Count: 2.4 10*3/uL — ABNORMAL LOW (ref 4.0–10.5)
nRBC: 0 % (ref 0.0–0.2)

## 2021-03-01 LAB — FERRITIN: Ferritin: 469 ng/mL — ABNORMAL HIGH (ref 24–336)

## 2021-03-01 LAB — CMP (CANCER CENTER ONLY)
ALT: 17 U/L (ref 0–44)
AST: 16 U/L (ref 15–41)
Albumin: 4.4 g/dL (ref 3.5–5.0)
Alkaline Phosphatase: 77 U/L (ref 38–126)
Anion gap: 4 — ABNORMAL LOW (ref 5–15)
BUN: 13 mg/dL (ref 8–23)
CO2: 30 mmol/L (ref 22–32)
Calcium: 10.6 mg/dL — ABNORMAL HIGH (ref 8.9–10.3)
Chloride: 106 mmol/L (ref 98–111)
Creatinine: 1.15 mg/dL (ref 0.61–1.24)
GFR, Estimated: >60 mL/min (ref >60)
Glucose, Bld: 107 mg/dL — ABNORMAL HIGH (ref 70–99)
Potassium: 4.5 mmol/L (ref 3.5–5.1)
Sodium: 140 mmol/L (ref 135–145)
Total Bilirubin: 0.9 mg/dL (ref 0.3–1.2)
Total Protein: 6.4 g/dL — ABNORMAL LOW (ref 6.5–8.1)

## 2021-03-01 LAB — RETICULOCYTES
Immature Retic Fract: 13.1 % (ref 2.3–15.9)
RBC.: 3.86 MIL/uL — ABNORMAL LOW (ref 4.22–5.81)
Retic Count, Absolute: 73 10*3/uL (ref 19.0–186.0)
Retic Ct Pct: 1.9 % (ref 0.4–3.1)

## 2021-03-01 LAB — IRON AND IRON BINDING CAPACITY (CC-WL,HP ONLY)
Iron: 136 ug/dL (ref 45–182)
Saturation Ratios: 49 % — ABNORMAL HIGH (ref 17.9–39.5)
TIBC: 276 ug/dL (ref 250–450)
UIBC: 140 ug/dL (ref 117–376)

## 2021-03-01 LAB — LACTATE DEHYDROGENASE: LDH: 287 U/L — ABNORMAL HIGH (ref 98–192)

## 2021-03-01 LAB — SAVE SMEAR(SSMR), FOR PROVIDER SLIDE REVIEW

## 2021-03-01 NOTE — Progress Notes (Signed)
Hematology and Oncology Follow Up Visit  Patrick [name] [id number] 11-Nov-1940 81 y.o. 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  As always, he does is traveling.  He works around the world.  He is about to go to [place] in March.  He was over in [place] and [place] since last time we saw Patrick [name].  He has been doing well.  He has had no problems with bleeding.  He has had no problems with the Coumadin.  He does have the mechanical aortic valve.  He has had no rashes.  There is been no leg swelling.  He has had no fever.  He has had no issues with COVID.  Unfortunate, his brother died this morning from stomach cancer.  He lived down in [place], [place]  Overall, his performance status is ECOG 0.     Medications:  Current Outpatient Medications:    amLODipine (NORVASC) 5 MG tablet, TAKE ONE TABLET BY MOUTH ONE TIME DAILY, Disp: 90 tablet, Rfl: 3   amoxicillin (AMOXIL) 500 MG capsule, Take 1,000 mg by mouth 2 (two) times daily., Disp: , Rfl:    atorvastatin (LIPITOR) 10 MG tablet, TAKE ONE TABLET BY MOUTH ONE TIME DAILY, Disp: 90 tablet, Rfl: 1   hydrocortisone 2.5 % ointment, Apply topically., Disp: , Rfl:    mometasone (ELOCON) 0.1 % cream, SMARTSIG:Sparingly Topical Twice Daily, Disp: , Rfl:    pantoprazole (PROTONIX) 40 MG tablet, TAKE ONE TABLET BY MOUTH ONE TIME DAILY, Disp: 90 tablet, Rfl: 3   SUMAtriptan (IMITREX) 25 MG tablet, Take 1 tablet (25 mg total) by mouth every 2 (two) hours as needed for migraine. May repeat in 2 hours if headache persists or recurs., Disp: 12 tablet, Rfl: 5   tadalafil (CIALIS) 5 MG tablet, Take 1 tablet (5 mg total) by mouth daily., Disp: 90 tablet, Rfl: 4   warfarin (JANTOVEN) 7.5 MG tablet, TAKE 1/2 TO 1 TABLET BY MOUTH ONCE DAILY OR AS DIRECTED BY THE ANTICOAGULATION CLINIC, Disp: 100 tablet, Rfl: 1  Allergies:  Allergies   Allergen Reactions   Codeine Rash   Terbinafine Itching    Past Medical History, Surgical history, Social history, and Family History were reviewed and updated.  Review of Systems: Review of Systems  Constitutional: Negative.   HENT:  Negative.    Eyes: Negative.   Respiratory: Negative.    Cardiovascular: Negative.   Gastrointestinal: Negative.   Endocrine: Negative.   Genitourinary: Negative.    Musculoskeletal: Negative.   Skin: Negative.   Neurological: Negative.   Hematological: Negative.   Psychiatric/Behavioral: Negative.     Physical Exam:  weight is 172 lb (78 kg). His oral temperature is 98.2 F (36.8 C). His blood pressure is 152/79 (abnormal) and his pulse is 64. His respiration is 19 and oxygen saturation is 100%.   Wt Readings from Last 3 Encounters:  03/01/21 172 lb (78 kg)  11/16/20 173 lb (78.5 kg)  09/21/20 176 lb 3.2 oz (79.9 kg)    Physical Exam Vitals reviewed.  HENT:     Head: Normocephalic and atraumatic.  Eyes:     Pupils: Pupils are equal, round, and reactive to light.  Cardiovascular:     Rate and Rhythm: Normal rate and regular rhythm.     Heart sounds: Normal heart sounds.     Comments: Cardiac exam shows a regular rate  and rhythm.  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  I hear no murmurs or rubs or bruits. Pulmonary:     Effort: Pulmonary effort is normal.     Breath sounds: Normal breath sounds.  Abdominal:     General: Bowel sounds are normal.     Palpations: Abdomen is soft.  Musculoskeletal:        General: No tenderness or deformity. Normal range of motion.     Cervical back: Normal range of motion.  Lymphadenopathy:     Cervical: No cervical adenopathy.  Skin:    General: Skin is warm and dry.     Findings: No erythema or rash.  Neurological:     Mental Status: He is alert and oriented to person, place, and time.  Psychiatric:        Behavior: Behavior normal.        Thought Content: Thought  content normal.        Judgment: Judgment normal.     Lab Results  Component Value Date   WBC 2.4 (L) 03/01/2021   HGB 13.8 03/01/2021   HCT 39.0 03/01/2021   MCV 100.5 (H) 03/01/2021   PLT 62 (L) 03/01/2021     Chemistry      Component Value Date/Time   NA 140 03/01/2021 0850   NA 139 11/12/2020 1030   K 4.5 03/01/2021 0850   CL 106 03/01/2021 0850   CO2 30 03/01/2021 0850   BUN 13 03/01/2021 0850   BUN 16 11/12/2020 1030   CREATININE 1.15 03/01/2021 0850      Component Value Date/Time   CALCIUM 10.6 (H) 03/01/2021 0850   ALKPHOS 77 03/01/2021 0850   AST 16 03/01/2021 0850   ALT 17 03/01/2021 0850   BILITOT 0.9 03/01/2021 0850       Impression and Plan: Patrick [name] is a 81 year old white male.  He has mild leukopenia and thrombocytopenia.  I looked at his blood smear.  I really do not see anything that looked suspicious on the blood smear.  I still think that myelodysplasia is a possibility.  Again, he is not symptomatic.  His blood counts are holding pretty stable.  His platelet count might be down a little bit.  Again, he is nonsymptomatic.  Again, we will hold off on doing a bone marrow biopsy on Patrick [name].  Again I suspect that we probably will have to do one at some point.  I would like to give Patrick [name] through The winter.  I will plan to see Patrick [name] back after he gets back from his trip to [place].    Volanda Napoleon, MD 1/6/20239:57 AM

## 2021-03-03 ENCOUNTER — Other Ambulatory Visit: Payer: Self-pay | Admitting: Internal Medicine

## 2021-03-14 ENCOUNTER — Encounter: Payer: Self-pay | Admitting: Vascular Surgery

## 2021-03-14 ENCOUNTER — Ambulatory Visit (INDEPENDENT_AMBULATORY_CARE_PROVIDER_SITE_OTHER): Payer: Medicare Other | Admitting: Vascular Surgery

## 2021-03-14 ENCOUNTER — Other Ambulatory Visit: Payer: Self-pay

## 2021-03-14 ENCOUNTER — Ambulatory Visit (HOSPITAL_COMMUNITY)
Admission: RE | Admit: 2021-03-14 | Discharge: 2021-03-14 | Disposition: A | Discharge status: Home / Self Care | Payer: Medicare Other | Source: Ambulatory Visit | Attending: Vascular Surgery | Admitting: Vascular Surgery

## 2021-03-14 VITALS — BP 155/82 | HR 50 | Temp 98.4°F | Resp 20 | Ht 72.0 in | Wt 175.0 lb

## 2021-03-14 DIAGNOSIS — I7143 Infrarenal abdominal aortic aneurysm, without rupture: Secondary | ICD-10-CM | POA: Diagnosis not present

## 2021-03-14 DIAGNOSIS — I714 Abdominal aortic aneurysm, without rupture, unspecified: Secondary | ICD-10-CM | POA: Insufficient documentation

## 2021-03-14 NOTE — Progress Notes (Signed)
REASON FOR VISIT:   Follow-up after endovascular aneurysm repair  MEDICAL ISSUES:   SACCULAR ANEURYSM INFRARENAL ABDOMINAL AORTA: This patient underwent endovascular repair of a saccular aneurysm of the infrarenal aorta on 11/28/2016.  I last saw him on 02/10/2018.  The maximum diameter of his aorta at that time was 2.5 cm.  Today the maximum diameter is 2.47 cm.  There is no evidence of endoleak.  I think it is safe to continue his follow-up at 2 years.  I have ordered a follow-up duplex of his abdominal aorta in 2 years and we will see him back at that time.  He knows to call sooner if he has problems.  He is not a smoker.  He is on a statin.  He is not on aspirin because he is on Coumadin for his aortic valve replacement.   HPI:   Patrick [name] is a pleasant 81 y.o. 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  On 11/28/2016 he underwent endovascular repair.  When I last saw him the maximum diameter of his aorta was 2.6 cm.  He comes in for a 2-year follow-up visit.  Since I saw him last he has no specific complaints.  He has some chronic back pain but no new onset abdominal pain or back pain.  He denies any history of claudication or rest pain.  He has some mild tendinitis in his lateral left ankle.  He is undergone aortic valve replacement and is done well from that standpoint.  He is maintained on Coumadin.   Past Medical History:  Diagnosis Date   Anticoagulant long-term use    Aortic insufficiency    a. 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  b. 06/2016: echo showed EF of 60-65%, Grade 1 DD, and normal functioning of the mechanical aortic prosthesis.   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   Migraine headache    S/P cardiac cath    [facility] in 2003 demonstrated  normal coronary arteries   Thrombocytopenia ([place]) 01/03/2019    Family History  Problem Relation Age of Onset   Ovarian cancer Mother    CAD Father    Colon cancer Neg Hx     SOCIAL HISTORY: Social History   Tobacco Use   Smoking status: Former    Packs/day: 1.00    Years: 10.00    Pack years: 10.00    Types: Cigarettes    Quit date: 08/08/1975    Years since quitting: 45.6   Smokeless tobacco: Never  Substance Use Topics   Alcohol use: Yes    Alcohol/week: 0.0 standard drinks    Comment: ONLY DRINK ON FRIDAYS    Allergies  Allergen Reactions   Codeine Rash   Terbinafine Itching    Current Outpatient Medications  Medication Sig Dispense Refill   amLODipine (NORVASC) 5 MG tablet TAKE ONE TABLET BY MOUTH ONE TIME DAILY 90 tablet 3   amoxicillin (AMOXIL) 500 MG capsule Take 1,000 mg by mouth 2 (two) times daily.     atorvastatin (LIPITOR) 10 MG tablet TAKE ONE TABLET BY MOUTH ONE TIME DAILY 90 tablet 3   hydrocortisone 2.5 % ointment Apply topically.     pantoprazole (PROTONIX) 40 MG tablet TAKE ONE TABLET BY MOUTH ONE TIME DAILY  90 tablet 3   SUMAtriptan (IMITREX) 25 MG tablet Take 1 tablet (25 mg total) by mouth every 2 (two) hours as needed for migraine. May repeat in 2 hours if headache persists or recurs. 12 tablet 5   tadalafil (CIALIS) 5 MG tablet Take 1 tablet (5 mg total) by mouth daily. 90 tablet 4   warfarin (JANTOVEN) 7.5 MG tablet TAKE 1/2 TO 1 TABLET BY MOUTH ONCE DAILY OR AS DIRECTED BY THE ANTICOAGULATION CLINIC 100 tablet 1   No current facility-administered medications for this visit.    REVIEW OF SYSTEMS:  [X]  denotes positive finding, [ ]  denotes negative finding Cardiac  Comments:  Chest pain or chest pressure:    Shortness of breath upon exertion:    Short of breath when lying flat:    Irregular heart rhythm:        Vascular    Pain in calf, thigh, or hip brought on by ambulation:    Pain in feet at night that wakes you up from your sleep:      Blood clot in your veins:    Leg swelling:         Pulmonary    Oxygen at home:    Productive cough:     Wheezing:         Neurologic    Sudden weakness in arms or legs:     Sudden numbness in arms or legs:     Sudden onset of difficulty speaking or slurred speech:    Temporary loss of vision in one eye:     Problems with dizziness:         Gastrointestinal    Blood in stool:     Vomited blood:         Genitourinary    Burning when urinating:     Blood in urine:        Psychiatric    Major depression:         Hematologic    Bleeding problems:    Problems with blood clotting too easily:        Skin    Rashes or ulcers:        Constitutional    Fever or chills:     PHYSICAL EXAM:   Vitals:   03/14/21 0824  BP: (!) 155/82  Pulse: (!) 50  Resp: 20  Temp: 98.4 F (36.9 C)  SpO2: 96%  Weight: 175 lb (79.4 kg)  Height: 6' (1.829 m)    GENERAL: The patient is a well-nourished male, in no acute distress. The vital signs are documented above. CARDIAC: There is a regular rate and rhythm.  VASCULAR: I do not detect carotid bruits. He has palpable femoral and pedal pulses. He has no significant lower extremity swelling. PULMONARY: There is good air exchange bilaterally without wheezing or rales. ABDOMEN: Soft and non-tender with normal pitched bowel sounds.  MUSCULOSKELETAL: There are no major deformities or cyanosis. NEUROLOGIC: No focal weakness or paresthesias are detected. SKIN: There are no ulcers or rashes noted. PSYCHIATRIC: The patient has a normal affect.  DATA:    DUPLEX ABDOMINAL AORTA: I have independently interpreted his duplex of the abdominal aorta.  The maximum diameter is 2.5 cm.  This is unchanged compared to the study in 03/23/2019 when it was 2.6 cm.  Deitra Mayo [facility] [phone]

## 2021-04-02 ENCOUNTER — Encounter: Payer: Self-pay | Admitting: Internal Medicine

## 2021-04-03 NOTE — Telephone Encounter (Signed)
Pt returning cma's call, pt requesting provider to respond via mychart regarding question below  Please advise

## 2021-04-05 ENCOUNTER — Encounter: Payer: Self-pay | Admitting: Internal Medicine

## 2021-04-05 ENCOUNTER — Other Ambulatory Visit: Payer: Self-pay

## 2021-04-05 ENCOUNTER — Ambulatory Visit (INDEPENDENT_AMBULATORY_CARE_PROVIDER_SITE_OTHER): Payer: Medicare Other | Admitting: Internal Medicine

## 2021-04-05 DIAGNOSIS — M21371 Foot drop, right foot: Secondary | ICD-10-CM

## 2021-04-05 DIAGNOSIS — R1031 Right lower quadrant pain: Secondary | ICD-10-CM | POA: Diagnosis not present

## 2021-04-05 DIAGNOSIS — Z7901 Long term (current) use of anticoagulants: Secondary | ICD-10-CM | POA: Diagnosis not present

## 2021-04-05 MED ORDER — CIPROFLOXACIN HCL 500 MG PO TABS: 500.0000 mg | ORAL_TABLET | Freq: Two times a day (BID) | ORAL | 1 refills | Status: AC

## 2021-04-05 NOTE — Progress Notes (Signed)
Subjective:  Patient ID: Patrick [name], male    DOB: Sep 10, 1940  Age: 81 y.o. MRN: [id number]  CC: Follow-up (Discuss multiple concerns, would like form signed for wife's surgery.)   HPI BREK REECE presents for recurrent right lower abdominal pain.  There is no nausea vomiting.  No fever.  The pain is moderate at times. H/o R ankle injury at 22. C/o R foot drop with a lot of walking x months; it starts when he walks briskly long distance like between the airport terminals.  His foot starts flapping.  No low back pain  Outpatient Medications Prior to Visit  Medication Sig Dispense Refill   amLODipine (NORVASC) 5 MG tablet TAKE ONE TABLET BY MOUTH ONE TIME DAILY 90 tablet 3   amoxicillin (AMOXIL) 500 MG capsule Take 1,000 mg by mouth 2 (two) times daily.     atorvastatin (LIPITOR) 10 MG tablet TAKE ONE TABLET BY MOUTH ONE TIME DAILY 90 tablet 3   hydrocortisone 2.5 % ointment Apply topically.     pantoprazole (PROTONIX) 40 MG tablet TAKE ONE TABLET BY MOUTH ONE TIME DAILY 90 tablet 3   SUMAtriptan (IMITREX) 25 MG tablet Take 1 tablet (25 mg total) by mouth every 2 (two) hours as needed for migraine. May repeat in 2 hours if headache persists or recurs. 12 tablet 5   tadalafil (CIALIS) 5 MG tablet Take 1 tablet (5 mg total) by mouth daily. 90 tablet 4   warfarin (JANTOVEN) 7.5 MG tablet TAKE 1/2 TO 1 TABLET BY MOUTH ONCE DAILY OR AS DIRECTED BY THE ANTICOAGULATION CLINIC 100 tablet 1   No facility-administered medications prior to visit.    ROS: Review of Systems  Constitutional:  Negative for appetite change, fatigue and unexpected weight change.  HENT:  Negative for congestion, nosebleeds, sneezing, sore throat and trouble swallowing.   Eyes:  Negative for itching and visual disturbance.  Respiratory:  Negative for cough.   Cardiovascular:  Negative for chest pain, palpitations and leg swelling.  Gastrointestinal:  Positive for abdominal pain. Negative for abdominal distention,  blood in stool, constipation, diarrhea, nausea and vomiting.  Genitourinary:  Negative for frequency and hematuria.  Musculoskeletal:  Negative for back pain, gait problem, joint swelling and neck pain.  Skin:  Negative for rash.  Neurological:  Positive for weakness. Negative for dizziness, tremors and speech difficulty.  Psychiatric/Behavioral:  Negative for agitation, dysphoric mood and sleep disturbance. The patient is not nervous/anxious.    Objective:  BP 122/62    Pulse (!) 58    Ht 6' (1.829 m)    Wt 178 lb (80.7 kg)    SpO2 94%    BMI 24.14 kg/m   BP Readings from Last 3 Encounters:  04/05/21 122/62  03/14/21 (!) 155/82  03/01/21 (!) 152/79    Wt Readings from Last 3 Encounters:  04/05/21 178 lb (80.7 kg)  03/14/21 175 lb (79.4 kg)  03/01/21 172 lb (78 kg)    Physical Exam Constitutional:      General: He is not in acute distress.    Appearance: He is well-developed.     Comments: NAD  Eyes:     Conjunctiva/sclera: Conjunctivae normal.     Pupils: Pupils are equal, round, and reactive to light.  Neck:     Thyroid: No thyromegaly.     Vascular: No JVD.  Cardiovascular:     Rate and Rhythm: Normal rate and regular rhythm.     Heart sounds: Normal heart sounds. No murmur  heard.   No friction rub. No gallop.  Pulmonary:     Effort: Pulmonary effort is normal. No respiratory distress.     Breath sounds: Normal breath sounds. No wheezing or rales.  Chest:     Chest wall: No tenderness.  Abdominal:     General: Bowel sounds are normal. There is no distension.     Palpations: Abdomen is soft. There is no mass.     Tenderness: There is abdominal tenderness. There is no guarding or rebound.  Musculoskeletal:        General: No tenderness. Normal range of motion.     Cervical back: Normal range of motion.  Lymphadenopathy:     Cervical: No cervical adenopathy.  Skin:    General: Skin is warm and dry.     Findings: No rash.  Neurological:     Mental Status: He is  alert and oriented to person, place, and time.     Cranial Nerves: No cranial nerve deficit.     Motor: No abnormal muscle tone.     Coordination: Coordination normal.     Gait: Gait normal.     Deep Tendon Reflexes: Reflexes are normal and symmetric.  Psychiatric:        Behavior: Behavior normal.        Thought Content: Thought content normal.        Judgment: Judgment normal.  Right lower quadrant is sensitive to palpation.  No mass.  No hernia.  No organomegaly Right ankle is slightly puffy.  No muscle atrophy  Lab Results  Component Value Date   WBC 2.4 (L) 03/01/2021   HGB 13.8 03/01/2021   HCT 39.0 03/01/2021   PLT 62 (L) 03/01/2021   GLUCOSE 107 (H) 03/01/2021   CHOL 129 06/28/2019   TRIG 96.0 06/28/2019   HDL 43.90 06/28/2019   LDLDIRECT 67.0 12/22/2017   LDLCALC 66 06/28/2019   ALT 17 03/01/2021   AST 16 03/01/2021   NA 140 03/01/2021   K 4.5 03/01/2021   CL 106 03/01/2021   CREATININE 1.15 03/01/2021   BUN 13 03/01/2021   CO2 30 03/01/2021   TSH 0.97 06/28/2019   PSA 1.66 06/28/2019   INR 2.4 02/28/2021   HGBA1C 5.3 08/20/2016    VAS [place] EVAR DUPLEX  Result Date: 03/14/2021 ABDOMINAL AORTA STUDY Patient Name:  SAVVA BEAMER  Date of Exam:   03/14/2021 Medical Rec #: [id number]       Accession #:    [id number] Date of Birth: 1940/08/16       Patient Gender: M Patient Age:   59 years Exam Location:  [facility] Procedure:      VAS [place] EVAR DUPLEX Referring Phys: Harrell Gave DICKSON --------------------------------------------------------------------------------  Indications: Follow up exam for EVAR. Surgery date EVAR 11/28/16.  Comparison Study: 03/23/19: EVAR 2.57 x 2.4 cm Performing Technologist: Ralene Cork RVT  Examination Guidelines: A complete evaluation includes B-mode imaging, spectral Doppler, color Doppler, and power Doppler as needed of all accessible portions of each vessel. Bilateral testing is considered an integral part of a complete  examination. Limited examinations for reoccurring indications may be performed as noted.  Endovascular Aortic Repair (EVAR): +----------+----------------+-------------------+-------------------+             Diameter AP (cm) Diameter Trans (cm) Velocities (cm/sec)  +----------+----------------+-------------------+-------------------+  Aorta      2.32             2.47  71                   +----------+----------------+-------------------+-------------------+  Right Limb 1.35             1.31                106                  +----------+----------------+-------------------+-------------------+  Left Limb  1.31             1.32                126                  +----------+----------------+-------------------+-------------------+  Summary: Abdominal Aorta: Patent endovascular aneurysm repair with no evidence of endoleak. Previous diameter measurement was 2.57 x 2.4 cm obtained on 03/23/19.  *See table(s) above for measurements and observations.  Electronically signed by Deitra Mayo MD on 03/14/2021 at 8:33:26 AM.   Final     Assessment & Plan:   Problem List Items Addressed This Visit     Abdominal pain    Right lower quadrant abdominal pain.  History of diverticulitis.  Using Cipro as needed travel/abdominal pain Obtain UA, CBC, c-Met CT scan of the abdomen       Anticoagulant long-term use    Continue on Coumadin      Foot drop, right    R ankle injury at 49. C/o R foot drop with a lot of walking x months; it starts when he walks briskly long distance like between the airport terminals.  His foot starts flapping.  No low back pain. Wear tall boots, like military boots for longer, brisk walks Sports medicine referral suggested-[name]         Meds ordered this encounter  Medications   ciprofloxacin (CIPRO) 500 MG tablet    Sig: Take 1 tablet (500 mg total) by mouth 2 (two) times daily for 10 days.    Dispense:  28 tablet    Refill:  1      Follow-up: Return  in about 3 months (around 07/03/2021).  Walker Kehr, MD

## 2021-04-09 ENCOUNTER — Encounter: Payer: Self-pay | Admitting: Internal Medicine

## 2021-04-10 ENCOUNTER — Encounter: Payer: Self-pay | Admitting: Internal Medicine

## 2021-04-10 NOTE — Assessment & Plan Note (Signed)
Right lower quadrant abdominal pain.  History of diverticulitis.  Using Cipro as needed travel/abdominal pain Obtain UA, CBC, c-Met CT scan of the abdomen
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
## 2021-04-10 NOTE — Assessment & Plan Note (Signed)
R ankle injury at 49. C/o R foot drop with a lot of walking x months; it starts when he walks briskly long distance like between the airport terminals.  His foot starts flapping.  No low back pain. Wear tall boots, like military boots for longer, brisk walks Sports medicine referral suggested-[name]

## 2021-04-10 NOTE — Assessment & Plan Note (Signed)
Continue on Coumadin

## 2021-04-11 ENCOUNTER — Ambulatory Visit (INDEPENDENT_AMBULATORY_CARE_PROVIDER_SITE_OTHER): Payer: Medicare Other

## 2021-04-11 ENCOUNTER — Ambulatory Visit: Payer: Medicare Other

## 2021-04-11 ENCOUNTER — Encounter: Payer: Self-pay | Admitting: Family Medicine

## 2021-04-11 ENCOUNTER — Other Ambulatory Visit: Payer: Self-pay

## 2021-04-11 ENCOUNTER — Ambulatory Visit (INDEPENDENT_AMBULATORY_CARE_PROVIDER_SITE_OTHER): Payer: Medicare Other | Admitting: Family Medicine

## 2021-04-11 VITALS — BP 130/76 | HR 72 | Ht 72.0 in | Wt 176.0 lb

## 2021-04-11 DIAGNOSIS — Z5181 Encounter for therapeutic drug level monitoring: Secondary | ICD-10-CM | POA: Diagnosis not present

## 2021-04-11 DIAGNOSIS — M79671 Pain in right foot: Secondary | ICD-10-CM

## 2021-04-11 DIAGNOSIS — R1031 Right lower quadrant pain: Secondary | ICD-10-CM

## 2021-04-11 DIAGNOSIS — M79672 Pain in left foot: Secondary | ICD-10-CM

## 2021-04-11 DIAGNOSIS — M545 Low back pain, unspecified: Secondary | ICD-10-CM

## 2021-04-11 DIAGNOSIS — G8929 Other chronic pain: Secondary | ICD-10-CM | POA: Diagnosis not present

## 2021-04-11 DIAGNOSIS — M21371 Foot drop, right foot: Secondary | ICD-10-CM | POA: Diagnosis not present

## 2021-04-11 DIAGNOSIS — M1711 Unilateral primary osteoarthritis, right knee: Secondary | ICD-10-CM

## 2021-04-11 DIAGNOSIS — M25561 Pain in right knee: Secondary | ICD-10-CM

## 2021-04-11 DIAGNOSIS — I712 Thoracic aortic aneurysm, without rupture, unspecified: Secondary | ICD-10-CM | POA: Diagnosis not present

## 2021-04-11 DIAGNOSIS — I359 Nonrheumatic aortic valve disorder, unspecified: Secondary | ICD-10-CM | POA: Diagnosis not present

## 2021-04-11 DIAGNOSIS — M47816 Spondylosis without myelopathy or radiculopathy, lumbar region: Secondary | ICD-10-CM | POA: Diagnosis not present

## 2021-04-11 LAB — CBC WITH DIFFERENTIAL/PLATELET
Basophils Absolute: 0 10*3/uL (ref 0.0–0.1)
Basophils Relative: 1.2 % (ref 0.0–3.0)
Eosinophils Absolute: 0 10*3/uL (ref 0.0–0.7)
Eosinophils Relative: 1 % (ref 0.0–5.0)
HCT: 38.6 % — ABNORMAL LOW (ref 39.0–52.0)
Hemoglobin: 13.3 g/dL (ref 13.0–17.0)
Lymphocytes Relative: 13.2 % (ref 12.0–46.0)
Lymphs Abs: 0.3 10*3/uL — ABNORMAL LOW (ref 0.7–4.0)
MCHC: 34.5 g/dL (ref 30.0–36.0)
MCV: 102 fl — ABNORMAL HIGH (ref 78.0–100.0)
Monocytes Absolute: 0.2 10*3/uL (ref 0.1–1.0)
Monocytes Relative: 6.8 % (ref 3.0–12.0)
Neutro Abs: 1.9 10*3/uL (ref 1.4–7.7)
Neutrophils Relative %: 77.8 % — ABNORMAL HIGH (ref 43.0–77.0)
Platelets: 53 10*3/uL — ABNORMAL LOW (ref 150.0–400.0)
RBC: 3.79 Mil/uL — ABNORMAL LOW (ref 4.22–5.81)
RDW: 15.7 % — ABNORMAL HIGH (ref 11.5–15.5)
WBC: 2.4 10*3/uL — ABNORMAL LOW (ref 4.0–10.5)

## 2021-04-11 LAB — COMPREHENSIVE METABOLIC PANEL
ALT: 19 U/L (ref 0–53)
AST: 18 U/L (ref 0–37)
Albumin: 4.2 g/dL (ref 3.5–5.2)
Alkaline Phosphatase: 70 U/L (ref 39–117)
BUN: 15 mg/dL (ref 6–23)
CO2: 34 mEq/L — ABNORMAL HIGH (ref 19–32)
Calcium: 9.4 mg/dL (ref 8.4–10.5)
Chloride: 105 mEq/L (ref 96–112)
Creatinine, Ser: 1.22 mg/dL (ref 0.40–1.50)
GFR: 56.69 mL/min — ABNORMAL LOW (ref >60.00)
Glucose, Bld: 131 mg/dL — ABNORMAL HIGH (ref 70–99)
Potassium: 4.2 mEq/L (ref 3.5–5.1)
Sodium: 140 mEq/L (ref 135–145)
Total Bilirubin: 0.8 mg/dL (ref 0.2–1.2)
Total Protein: 6.2 g/dL (ref 6.0–8.3)

## 2021-04-11 LAB — URINALYSIS
Bilirubin Urine: NEGATIVE
Ketones, ur: NEGATIVE
Leukocytes,Ua: NEGATIVE
Nitrite: NEGATIVE
Specific Gravity, Urine: 1.025 (ref 1.000–1.030)
Total Protein, Urine: NEGATIVE
Urine Glucose: NEGATIVE
Urobilinogen, UA: 0.2 (ref 0.0–1.0)
pH: 5.5 (ref 5.0–8.0)

## 2021-04-11 LAB — POCT INR: INR: 2.7 (ref 2.0–3.0)

## 2021-04-11 NOTE — Patient Instructions (Addendum)
Xray today Knee injection Medial unloader web brace Orthotics Calf exercises See me again in 4-6 weeks

## 2021-04-11 NOTE — Assessment & Plan Note (Signed)
Patient is noticing increasing in discomfort at the moment.  Patient is noticing the increase in frequency.  Does not seem to be the back.  Seems to be more potentially an exertional compartment syndrome of some sort.  We will try the custom orthotics and home exercises.  No worsening pain do need to consider a nerve conduction study as well as compartment testing.  X-ray of the lumbar spine ordered today but do not think that this is likely a lumbar radiculopathy with it only happening with exertional component.

## 2021-04-11 NOTE — Assessment & Plan Note (Signed)
Patient has had arthritic changes noted.  Patient does have some increasing instability.  Medial unloader brace discussed.  Injection given today, could be a candidate for viscosupplementation.  Discussed custom orthotics that I think will also be beneficial.

## 2021-04-11 NOTE — Patient Instructions (Signed)
Description   Continue on same dosage 1 tablet daily except 1/2 tablet on Sundays. Recheck INR in 6 weeks. Call with any questions or new medications #[phone].

## 2021-04-12 ENCOUNTER — Encounter: Payer: Self-pay | Admitting: Hematology & Oncology

## 2021-04-16 ENCOUNTER — Other Ambulatory Visit: Payer: Self-pay

## 2021-04-16 ENCOUNTER — Ambulatory Visit (INDEPENDENT_AMBULATORY_CARE_PROVIDER_SITE_OTHER): Payer: Medicare Other | Admitting: Sports Medicine

## 2021-04-16 VITALS — BP 124/84 | Ht 72.0 in | Wt 175.0 lb

## 2021-04-16 DIAGNOSIS — M25561 Pain in right knee: Secondary | ICD-10-CM

## 2021-04-16 DIAGNOSIS — L820 Inflamed seborrheic keratosis: Secondary | ICD-10-CM | POA: Diagnosis not present

## 2021-04-16 DIAGNOSIS — M21371 Foot drop, right foot: Secondary | ICD-10-CM

## 2021-04-16 DIAGNOSIS — Z85828 Personal history of other malignant neoplasm of skin: Secondary | ICD-10-CM | POA: Diagnosis not present

## 2021-04-16 DIAGNOSIS — L218 Other seborrheic dermatitis: Secondary | ICD-10-CM | POA: Diagnosis not present

## 2021-04-17 ENCOUNTER — Encounter: Payer: Self-pay | Admitting: Internal Medicine

## 2021-04-17 NOTE — Progress Notes (Signed)
° °  Subjective:    Patient ID: Patrick [name], male    DOB: [birth year], 81 y.o.   MRN: [id number]  HPI chief complaint: Bilateral knee pain  Patient is a very pleasant 81 year old male sent over at the request of [name] for possible custom orthotics.  Patient has a history of bilateral knee DJD.  His right knee is more painful than the left.  He also endorses intermittent right foot drop when walking several yards.  This is chronic for him.  He has a pair of off-the-shelf orthotics that he has used in his golf shoes for treatment of Planter fasciitis in the past.  He finds these orthotics to be quite comfortable.  He has not tried moving them into other shoes  Past medical history reviewed Medications reviewed Allergies reviewed    Review of Systems As above    Objective:   Physical Exam  Well-developed, well-nourished.  No acute distress.  Examination of his feet in the standing position shows a fairly well-preserved longitudinal arch.  Knees show full range of motion without effusion.  Normal gait without foot drop currently.      Assessment & Plan:   Bilateral knee pain secondary to DJD Intermittent right foot drop  I had a long discussion with the patient about custom orthotics.  I explained that custom orthotics for him would mean good arch support and better cushioning to hopefully help with his knee osteoarthritis pain.  It would not do anything to correct his intermittent right foot drop.  He elected to forego custom orthotics and instead continue to use his off-the-shelf orthotics.  I did provide him with the name of [employer], the maker of custom athletic AFOs, which he may want to consider down the road if his foot drop becomes more severe.  In his case, this would not need to be worn constantly but instead if he is going to be walking for long periods of time such as walking in the airport.  Follow-up with me as needed.  This note was dictated using Dragon naturally  speaking software and may contain errors in syntax, spelling, or content which have not been identified prior to signing this note.

## 2021-04-19 ENCOUNTER — Other Ambulatory Visit: Payer: Self-pay

## 2021-04-19 MED ORDER — SUMATRIPTAN SUCCINATE 25 MG PO TABS: 25.0000 mg | ORAL_TABLET | ORAL | 5 refills | Status: DC | PRN

## 2021-05-02 ENCOUNTER — Ambulatory Visit: Payer: Medicare Other | Admitting: Family Medicine

## 2021-05-02 DIAGNOSIS — Z20822 Contact with and (suspected) exposure to covid-19: Secondary | ICD-10-CM | POA: Diagnosis not present

## 2021-05-11 ENCOUNTER — Other Ambulatory Visit: Payer: Self-pay | Admitting: Internal Medicine

## 2021-05-11 DIAGNOSIS — R1031 Right lower quadrant pain: Secondary | ICD-10-CM

## 2021-05-15 DIAGNOSIS — Z20822 Contact with and (suspected) exposure to covid-19: Secondary | ICD-10-CM | POA: Diagnosis not present
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
## 2021-05-23 ENCOUNTER — Other Ambulatory Visit: Payer: Self-pay | Admitting: Internal Medicine

## 2021-05-23 ENCOUNTER — Ambulatory Visit (INDEPENDENT_AMBULATORY_CARE_PROVIDER_SITE_OTHER): Payer: Medicare Other

## 2021-05-23 ENCOUNTER — Ambulatory Visit (INDEPENDENT_AMBULATORY_CARE_PROVIDER_SITE_OTHER): Payer: Medicare Other | Admitting: Family Medicine

## 2021-05-23 VITALS — BP 122/82 | HR 64 | Ht 72.0 in | Wt 169.0 lb

## 2021-05-23 DIAGNOSIS — M5416 Radiculopathy, lumbar region: Secondary | ICD-10-CM

## 2021-05-23 DIAGNOSIS — M545 Low back pain, unspecified: Secondary | ICD-10-CM | POA: Diagnosis not present

## 2021-05-23 DIAGNOSIS — Z5181 Encounter for therapeutic drug level monitoring: Secondary | ICD-10-CM

## 2021-05-23 DIAGNOSIS — I712 Thoracic aortic aneurysm, without rupture, unspecified: Secondary | ICD-10-CM

## 2021-05-23 DIAGNOSIS — I359 Nonrheumatic aortic valve disorder, unspecified: Secondary | ICD-10-CM | POA: Diagnosis not present

## 2021-05-23 DIAGNOSIS — M1711 Unilateral primary osteoarthritis, right knee: Secondary | ICD-10-CM | POA: Diagnosis not present

## 2021-05-23 LAB — POCT INR: INR: 1.6 — AB (ref 2.0–3.0)

## 2021-05-23 NOTE — Assessment & Plan Note (Signed)
Severe arthritic changes noted at the L5-S1 that was likely given patient's foot drop.  Patient in addition of that likely will be having some leg weakness noted.  I do feel at this point a CT myelogram is necessary today further evaluation and potential treatment.  Patient will be scheduled for this.  Follow-up with me after imaging to discuss. ?

## 2021-05-23 NOTE — Assessment & Plan Note (Signed)
Mild to moderate overall.  Patient does feel like he has some instability.  Discussed with patient about advanced imaging but secondary to the weakness we have having in the late overall I am concerned for more of a lumbar radiculopathy and we will look with a CT myelogram.  Patient would not be a candidate for MRI of the knee anyhow at this point secondary to his other comorbidities and past surgical history.  Patient will follow-up with me though again after we further evaluate patient back pain for this problem. ?

## 2021-05-23 NOTE — Patient Instructions (Signed)
Description   ?Take 2 tablets today and then continue on same dosage 1 tablet daily except 1/2 tablet on Sundays. Recheck INR in 4 weeks. Call with any questions or new medications #[phone].  ?  ?   ?

## 2021-05-23 NOTE — Patient Instructions (Signed)
Turn heel lift around in shoe ?We will be in contact with results ?Call [facility] to schedule [phone] ?

## 2021-05-27 ENCOUNTER — Encounter: Payer: Self-pay | Admitting: Internal Medicine

## 2021-05-30 ENCOUNTER — Ambulatory Visit: Payer: Medicare Other | Admitting: Hematology & Oncology

## 2021-05-30 ENCOUNTER — Inpatient Hospital Stay: Payer: Medicare Other

## 2021-06-01 DIAGNOSIS — Z20828 Contact with and (suspected) exposure to other viral communicable diseases: Secondary | ICD-10-CM | POA: Diagnosis not present

## 2021-06-03 ENCOUNTER — Other Ambulatory Visit: Payer: Self-pay | Admitting: Internal Medicine

## 2021-06-04 ENCOUNTER — Telehealth: Payer: Self-pay | Admitting: Hematology & Oncology

## 2021-06-04 NOTE — Telephone Encounter (Signed)
Patient called and LVM 4/10 requesting to reschedule appointment , called patient back and LVM today  ?

## 2021-06-05 ENCOUNTER — Other Ambulatory Visit: Payer: Medicare Other

## 2021-06-05 ENCOUNTER — Ambulatory Visit
Admission: RE | Admit: 2021-06-05 | Discharge: 2021-06-05 | Disposition: A | Discharge status: Home / Self Care | Payer: Medicare Other | Source: Ambulatory Visit | Attending: Internal Medicine | Admitting: Internal Medicine

## 2021-06-05 DIAGNOSIS — K5790 Diverticulosis of intestine, part unspecified, without perforation or abscess without bleeding: Secondary | ICD-10-CM | POA: Diagnosis not present

## 2021-06-05 DIAGNOSIS — N400 Enlarged prostate without lower urinary tract symptoms: Secondary | ICD-10-CM | POA: Diagnosis not present

## 2021-06-05 DIAGNOSIS — R1031 Right lower quadrant pain: Secondary | ICD-10-CM

## 2021-06-05 DIAGNOSIS — M47816 Spondylosis without myelopathy or radiculopathy, lumbar region: Secondary | ICD-10-CM | POA: Diagnosis not present

## 2021-06-05 DIAGNOSIS — N281 Cyst of kidney, acquired: Secondary | ICD-10-CM | POA: Diagnosis not present

## 2021-06-05 MED ORDER — IOPAMIDOL (ISOVUE-300) INJECTION 61%
100.0000 mL | Freq: Once | INTRAVENOUS | Status: AC | PRN
  Administered 2021-06-05: 100 mL via INTRAVENOUS

## 2021-06-17 DIAGNOSIS — Z20822 Contact with and (suspected) exposure to covid-19: Secondary | ICD-10-CM | POA: Diagnosis not present

## 2021-06-20 ENCOUNTER — Ambulatory Visit (INDEPENDENT_AMBULATORY_CARE_PROVIDER_SITE_OTHER): Payer: Medicare Other

## 2021-06-20 DIAGNOSIS — I712 Thoracic aortic aneurysm, without rupture, unspecified: Secondary | ICD-10-CM | POA: Diagnosis not present

## 2021-06-20 DIAGNOSIS — Z5181 Encounter for therapeutic drug level monitoring: Secondary | ICD-10-CM | POA: Diagnosis not present

## 2021-06-20 DIAGNOSIS — I359 Nonrheumatic aortic valve disorder, unspecified: Secondary | ICD-10-CM

## 2021-06-20 LAB — POCT INR: INR: 1.7 — AB (ref 2.0–3.0)

## 2021-06-20 NOTE — Patient Instructions (Signed)
Description   ?Take 1.5 tablets today and then START taking 1 tablet daily.  ?Recheck INR in 2 weeks.  ?Call with any questions or new medications #[phone].  ?  ?   ?

## 2021-06-24 DIAGNOSIS — Z20828 Contact with and (suspected) exposure to other viral communicable diseases: Secondary | ICD-10-CM | POA: Diagnosis not present

## 2021-06-25 ENCOUNTER — Inpatient Hospital Stay (HOSPITAL_BASED_OUTPATIENT_CLINIC_OR_DEPARTMENT_OTHER): Payer: Medicare Other | Admitting: Hematology & Oncology

## 2021-06-25 ENCOUNTER — Encounter: Payer: Self-pay | Admitting: Hematology & Oncology

## 2021-06-25 ENCOUNTER — Inpatient Hospital Stay: Payer: Medicare Other | Attending: Hematology & Oncology

## 2021-06-25 VITALS — BP 168/70 | HR 54 | Temp 97.6°F | Resp 18 | Ht 72.0 in | Wt 172.5 lb

## 2021-06-25 DIAGNOSIS — Z79899 Other long term (current) drug therapy: Secondary | ICD-10-CM | POA: Diagnosis not present

## 2021-06-25 DIAGNOSIS — D696 Thrombocytopenia, unspecified: Secondary | ICD-10-CM

## 2021-06-25 DIAGNOSIS — Z7901 Long term (current) use of anticoagulants: Secondary | ICD-10-CM | POA: Diagnosis not present

## 2021-06-25 DIAGNOSIS — D72819 Decreased white blood cell count, unspecified: Secondary | ICD-10-CM | POA: Insufficient documentation

## 2021-06-25 DIAGNOSIS — D72810 Lymphocytopenia: Secondary | ICD-10-CM | POA: Diagnosis not present

## 2021-06-25 DIAGNOSIS — Z952 Presence of prosthetic heart valve: Secondary | ICD-10-CM | POA: Diagnosis not present

## 2021-06-25 DIAGNOSIS — K760 Fatty (change of) liver, not elsewhere classified: Secondary | ICD-10-CM | POA: Insufficient documentation

## 2021-06-25 LAB — CMP (CANCER CENTER ONLY)
ALT: 21 U/L (ref 0–44)
AST: 19 U/L (ref 15–41)
Albumin: 4.4 g/dL (ref 3.5–5.0)
Alkaline Phosphatase: 64 U/L (ref 38–126)
Anion gap: 5 (ref 5–15)
BUN: 16 mg/dL (ref 8–23)
CO2: 29 mmol/L (ref 22–32)
Calcium: 9.7 mg/dL (ref 8.9–10.3)
Chloride: 106 mmol/L (ref 98–111)
Creatinine: 1.15 mg/dL (ref 0.61–1.24)
GFR, Estimated: >60 mL/min (ref >60)
Glucose, Bld: 114 mg/dL — ABNORMAL HIGH (ref 70–99)
Potassium: 4.8 mmol/L (ref 3.5–5.1)
Sodium: 140 mmol/L (ref 135–145)
Total Bilirubin: 0.7 mg/dL (ref 0.3–1.2)
Total Protein: 6.3 g/dL — ABNORMAL LOW (ref 6.5–8.1)

## 2021-06-25 LAB — CBC WITH DIFFERENTIAL (CANCER CENTER ONLY)
Abs Immature Granulocytes: 0.01 10*3/uL (ref 0.00–0.07)
Basophils Absolute: 0 10*3/uL (ref 0.0–0.1)
Basophils Relative: 1 %
Eosinophils Absolute: 0 10*3/uL (ref 0.0–0.5)
Eosinophils Relative: 1 %
HCT: 38.3 % — ABNORMAL LOW (ref 39.0–52.0)
Hemoglobin: 13.5 g/dL (ref 13.0–17.0)
Immature Granulocytes: 1 %
Lymphocytes Relative: 17 %
Lymphs Abs: 0.4 10*3/uL — ABNORMAL LOW (ref 0.7–4.0)
MCH: 35.5 pg — ABNORMAL HIGH (ref 26.0–34.0)
MCHC: 35.2 g/dL (ref 30.0–36.0)
MCV: 100.8 fL — ABNORMAL HIGH (ref 80.0–100.0)
Monocytes Absolute: 0.2 10*3/uL (ref 0.1–1.0)
Monocytes Relative: 8 %
Neutro Abs: 1.6 10*3/uL — ABNORMAL LOW (ref 1.7–7.7)
Neutrophils Relative %: 72 %
Platelet Count: 52 10*3/uL — ABNORMAL LOW (ref 150–400)
RBC: 3.8 MIL/uL — ABNORMAL LOW (ref 4.22–5.81)
RDW: 14.7 % (ref 11.5–15.5)
WBC Count: 2.2 10*3/uL — ABNORMAL LOW (ref 4.0–10.5)
nRBC: 0 % (ref 0.0–0.2)

## 2021-06-25 LAB — LACTATE DEHYDROGENASE: LDH: 250 U/L — ABNORMAL HIGH (ref 98–192)

## 2021-06-25 LAB — SAVE SMEAR(SSMR), FOR PROVIDER SLIDE REVIEW

## 2021-06-25 NOTE — Progress Notes (Signed)
?Hematology and Oncology Follow Up Visit ? ?Patrick [name] ?[id number] ?22-Jun-1940 81 y.o. ?06/25/2021 ? ? 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 ?Current Therapy:   ?Observation ?    ?Interim History:  Patrick [name] is back for his follow-up.  The big news is that his wife had knee surgery about a month ago.  She is having a slow recovery because of some osteoporosis. ? ?He said that he had a CT scan done recently.  This was done on 06/05/2021.  This showed a fatty liver.  Had multiple hepatic cysts.  The largest measured 7.8 cm.  Endocrine increased in size.  He had a stable hypodensity in the head of the pancreas.  There was no splenomegaly.  He has some renal cysts. ? ?He has had no problems with bleeding.  He is on Coumadin. ? ?He has had no problems with nausea or vomiting.  He has had no chest pain.  He does have the mechanical valve. ? ?He is not sure what is going to travel again.  Again he has to take care of his wife. ? ?Overall, his performance status right now is ECOG 0.     ? ?Medications:  ?Current Outpatient Medications:  ?  amLODipine (NORVASC) 5 MG tablet, TAKE ONE TABLET BY MOUTH ONE TIME DAILY, Disp: 90 tablet, Rfl: 3 ?  atorvastatin (LIPITOR) 10 MG tablet, Take 1 tablet (10 mg total) by mouth daily. Follow-up on Lipids is due must see MD fore refills, Disp: 30 tablet, Rfl: 0 ?  pantoprazole (PROTONIX) 40 MG tablet, TAKE ONE TABLET BY MOUTH ONE TIME DAILY, Disp: 90 tablet, Rfl: 3 ?  tadalafil (CIALIS) 5 MG tablet, Take 1 tablet (5 mg total) by mouth daily., Disp: 90 tablet, Rfl: 4 ?  warfarin (JANTOVEN) 7.5 MG tablet, TAKE 1/2 TO 1 TABLET BY MOUTH ONCE DAILY OR AS DIRECTED BY THE ANTICOAGULATION CLINIC, Disp: 100 tablet, Rfl: 1 ?  amoxicillin (AMOXIL) 500 MG capsule, Take 1,000 mg by mouth 2 (two) times daily. (Patient not taking: Reported on 06/25/2021), Disp: , Rfl:  ?  hydrocortisone 2.5 % ointment, Apply topically. (Patient not taking: Reported on 06/25/2021), Disp: , Rfl:   ?  SUMAtriptan (IMITREX) 25 MG tablet, Take 1 tablet (25 mg total) by mouth every 2 (two) hours as needed for migraine. May repeat in 2 hours if headache persists or recurs. (Patient not taking: Reported on 06/25/2021), Disp: 12 tablet, Rfl: 5 ? ?Allergies:  ?Allergies  ?Allergen Reactions  ? Codeine Rash  ? Terbinafine Itching  ? ? ?Past Medical History, Surgical history, Social history, and Family History were reviewed and updated. ? ?Review of Systems: ?Review of Systems  ?Constitutional: Negative.   ?HENT:  Negative.    ?Eyes: Negative.   ?Respiratory: Negative.    ?Cardiovascular: Negative.   ?Gastrointestinal: Negative.   ?Endocrine: Negative.   ?Genitourinary: Negative.    ?Musculoskeletal: Negative.   ?Skin: Negative.   ?Neurological: Negative.   ?Hematological: Negative.   ?Psychiatric/Behavioral: Negative.    ? ?Physical Exam: ? height is 6' (1.829 m) and weight is 172 lb 8 oz (78.2 kg). His oral temperature is 97.6 ?F (36.4 ?C). His blood pressure is 168/70 (abnormal) and his pulse is 54 (abnormal). His respiration is 18 and oxygen saturation is 100%.  ? ?Wt Readings from Last 3 Encounters:  ?06/25/21 172 lb 8 oz (78.2 kg)  ?05/23/21 169 lb (76.7 kg)  ?04/16/21 175 lb (79.4 kg)  ? ? ?Physical Exam ?Vitals  reviewed.  ?HENT:  ?   Head: Normocephalic and atraumatic.  ?Eyes:  ?   Pupils: Pupils are equal, round, and reactive to light.  ?Cardiovascular:  ?   Rate and Rhythm: Normal rate and regular rhythm.  ?   Heart sounds: Normal heart sounds.  ?   Comments: Cardiac exam shows a regular rate and rhythm.  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  I hear no murmurs or rubs or bruits. ?Pulmonary:  ?   Effort: Pulmonary effort is normal.  ?   Breath sounds: Normal breath sounds.  ?Abdominal:  ?   General: Bowel sounds are normal.  ?   Palpations: Abdomen is soft.  ?Musculoskeletal:     ?   General: No tenderness or deformity. Normal range of motion.  ?   Cervical back: Normal range of  motion.  ?Lymphadenopathy:  ?   Cervical: No cervical adenopathy.  ?Skin: ?   General: Skin is warm and dry.  ?   Findings: No erythema or rash.  ?Neurological:  ?   Mental Status: He is alert and oriented to person, place, and time.  ?Psychiatric:     ?   Behavior: Behavior normal.     ?   Thought Content: Thought content normal.     ?   Judgment: Judgment normal.  ? ? ? ?Lab Results  ?Component Value Date  ? WBC 2.2 (L) 06/25/2021  ? HGB 13.5 06/25/2021  ? HCT 38.3 (L) 06/25/2021  ? MCV 100.8 (H) 06/25/2021  ? PLT 52 (L) 06/25/2021  ? ?  Chemistry   ?   ?Component Value Date/Time  ? NA 140 06/25/2021 1027  ? NA 139 11/12/2020 1030  ? K 4.8 06/25/2021 1027  ? CL 106 06/25/2021 1027  ? CO2 29 06/25/2021 1027  ? BUN 16 06/25/2021 1027  ? BUN 16 11/12/2020 1030  ? CREATININE 1.15 06/25/2021 1027  ?    ?Component Value Date/Time  ? CALCIUM 9.7 06/25/2021 1027  ? ALKPHOS 64 06/25/2021 1027  ? AST 19 06/25/2021 1027  ? ALT 21 06/25/2021 1027  ? BILITOT 0.7 06/25/2021 1027  ?  ? ? ? ?Impression and Plan: ?Patrick [name] is a 81 year old white male.  He has mild leukopenia and thrombocytopenia.  I looked at his blood smear.  I really do not see anything that looked suspicious on the blood smear. ? ?Having the fatty liver certainly could affect his white cell count and platelet count.  Again, I think that there could certainly be some element of myelodysplasia.  We are trying to hold off on doing a bone marrow biopsy on him. ? ?To me, his blood counts are holding pretty steady which is nice to see. ? ?We will still follow up in another 3 months.  Hopefully, his wife will be doing better.  I am sure that he will be somewhere in [place] or [place] before I see him back. ? ? ?Volanda Napoleon, MD ?5/2/202311:12 AM ?

## 2021-06-26 DIAGNOSIS — L570 Actinic keratosis: Secondary | ICD-10-CM | POA: Diagnosis not present

## 2021-06-26 DIAGNOSIS — D2271 Melanocytic nevi of right lower limb, including hip: Secondary | ICD-10-CM | POA: Diagnosis not present

## 2021-06-26 DIAGNOSIS — D2272 Melanocytic nevi of left lower limb, including hip: Secondary | ICD-10-CM | POA: Diagnosis not present

## 2021-06-26 DIAGNOSIS — D225 Melanocytic nevi of trunk: Secondary | ICD-10-CM | POA: Diagnosis not present

## 2021-06-26 DIAGNOSIS — Z85828 Personal history of other malignant neoplasm of skin: Secondary | ICD-10-CM | POA: Diagnosis not present

## 2021-06-26 DIAGNOSIS — L821 Other seborrheic keratosis: Secondary | ICD-10-CM | POA: Diagnosis not present

## 2021-07-04 ENCOUNTER — Ambulatory Visit (INDEPENDENT_AMBULATORY_CARE_PROVIDER_SITE_OTHER): Payer: Medicare Other | Admitting: *Deleted

## 2021-07-04 DIAGNOSIS — I359 Nonrheumatic aortic valve disorder, unspecified: Secondary | ICD-10-CM

## 2021-07-04 DIAGNOSIS — Z5181 Encounter for therapeutic drug level monitoring: Secondary | ICD-10-CM | POA: Diagnosis not present

## 2021-07-04 DIAGNOSIS — I712 Thoracic aortic aneurysm, without rupture, unspecified: Secondary | ICD-10-CM

## 2021-07-04 LAB — POCT INR: INR: 1.8 — AB (ref 2.0–3.0)

## 2021-07-04 NOTE — Patient Instructions (Signed)
Description   ?Take 1.5 tablets today and then START taking 1 tablet daily. Recheck INR in 2 weeks.  ?Call with any questions or new medications #[phone].  ?  ?  ?

## 2021-07-12 ENCOUNTER — Telehealth: Payer: Self-pay | Admitting: *Deleted

## 2021-07-12 NOTE — Telephone Encounter (Signed)
Called patient to schedule medicare AWV. Patient declined to schedule any future AWV.

## 2021-07-18 ENCOUNTER — Ambulatory Visit (INDEPENDENT_AMBULATORY_CARE_PROVIDER_SITE_OTHER): Payer: Medicare Other

## 2021-07-18 DIAGNOSIS — I712 Thoracic aortic aneurysm, without rupture, unspecified: Secondary | ICD-10-CM | POA: Diagnosis not present

## 2021-07-18 DIAGNOSIS — I359 Nonrheumatic aortic valve disorder, unspecified: Secondary | ICD-10-CM | POA: Diagnosis not present

## 2021-07-18 DIAGNOSIS — Z5181 Encounter for therapeutic drug level monitoring: Secondary | ICD-10-CM | POA: Diagnosis not present

## 2021-07-18 LAB — POCT INR: INR: 1.4 — AB (ref 2.0–3.0)

## 2021-07-18 NOTE — Patient Instructions (Signed)
Description   Take 1.5 tablets today and tomorrow, then START taking 1 tablet daily except 1.5 tablets on Fridays. Recheck INR in 2 weeks. Call with any questions or new medications #[phone].

## 2021-07-30 ENCOUNTER — Encounter: Payer: Self-pay | Admitting: Family Medicine

## 2021-07-30 ENCOUNTER — Ambulatory Visit
Admission: RE | Admit: 2021-07-30 | Discharge: 2021-07-30 | Disposition: A | Discharge status: Home / Self Care | Payer: Medicare Other | Source: Ambulatory Visit | Attending: Family Medicine | Admitting: Family Medicine

## 2021-07-30 DIAGNOSIS — M545 Low back pain, unspecified: Secondary | ICD-10-CM

## 2021-07-30 DIAGNOSIS — M5126 Other intervertebral disc displacement, lumbar region: Secondary | ICD-10-CM | POA: Diagnosis not present

## 2021-07-30 DIAGNOSIS — M2578 Osteophyte, vertebrae: Secondary | ICD-10-CM | POA: Diagnosis not present

## 2021-07-30 DIAGNOSIS — M4696 Unspecified inflammatory spondylopathy, lumbar region: Secondary | ICD-10-CM | POA: Diagnosis not present

## 2021-07-30 MED ORDER — MEPERIDINE HCL 50 MG/ML IJ SOLN: 50.0000 mg | Freq: Once | INTRAMUSCULAR | Status: DC | PRN

## 2021-07-30 MED ORDER — IOPAMIDOL (ISOVUE-M 200) INJECTION 41%
15.0000 mL | Freq: Once | INTRAMUSCULAR | Status: AC
  Administered 2021-07-30: 15 mL via INTRATHECAL

## 2021-07-30 MED ORDER — DIAZEPAM 5 MG PO TABS
5.0000 mg | ORAL_TABLET | Freq: Once | ORAL | Status: AC
  Administered 2021-07-30: 5 mg via ORAL

## 2021-07-30 MED ORDER — ONDANSETRON HCL 4 MG/2ML IJ SOLN: 4.0000 mg | Freq: Once | INTRAMUSCULAR | Status: DC | PRN

## 2021-07-30 NOTE — Discharge Instructions (Signed)

## 2021-08-01 ENCOUNTER — Ambulatory Visit (INDEPENDENT_AMBULATORY_CARE_PROVIDER_SITE_OTHER): Payer: Medicare Other

## 2021-08-01 DIAGNOSIS — Z5181 Encounter for therapeutic drug level monitoring: Secondary | ICD-10-CM

## 2021-08-01 DIAGNOSIS — I712 Thoracic aortic aneurysm, without rupture, unspecified: Secondary | ICD-10-CM

## 2021-08-01 DIAGNOSIS — I359 Nonrheumatic aortic valve disorder, unspecified: Secondary | ICD-10-CM | POA: Diagnosis not present

## 2021-08-01 LAB — POCT INR: INR: 1.6 — AB (ref 2.0–3.0)

## 2021-08-01 NOTE — Patient Instructions (Signed)
TAKE 2 TABLETS TODAY ONLY and then continue 1 tablet daily except 1.5 tablets on Fridays. Recheck INR in 2 weeks. Call with any questions or new medications #[phone].

## 2021-08-20 ENCOUNTER — Ambulatory Visit (INDEPENDENT_AMBULATORY_CARE_PROVIDER_SITE_OTHER): Payer: Medicare Other

## 2021-08-20 DIAGNOSIS — I359 Nonrheumatic aortic valve disorder, unspecified: Secondary | ICD-10-CM

## 2021-08-20 DIAGNOSIS — Z5181 Encounter for therapeutic drug level monitoring: Secondary | ICD-10-CM | POA: Diagnosis not present

## 2021-08-20 DIAGNOSIS — I712 Thoracic aortic aneurysm, without rupture, unspecified: Secondary | ICD-10-CM

## 2021-08-20 LAB — POCT INR: INR: 2.8 (ref 2.0–3.0)

## 2021-08-23 ENCOUNTER — Other Ambulatory Visit: Payer: Self-pay

## 2021-08-23 DIAGNOSIS — M5416 Radiculopathy, lumbar region: Secondary | ICD-10-CM

## 2021-08-29 ENCOUNTER — Ambulatory Visit (INDEPENDENT_AMBULATORY_CARE_PROVIDER_SITE_OTHER): Payer: Medicare Other | Admitting: Urology

## 2021-08-29 ENCOUNTER — Encounter: Payer: Self-pay | Admitting: Urology

## 2021-08-29 VITALS — BP 159/75 | HR 66 | Ht 72.0 in | Wt 170.0 lb

## 2021-08-29 DIAGNOSIS — N529 Male erectile dysfunction, unspecified: Secondary | ICD-10-CM

## 2021-08-29 DIAGNOSIS — N401 Enlarged prostate with lower urinary tract symptoms: Secondary | ICD-10-CM | POA: Diagnosis not present

## 2021-08-29 DIAGNOSIS — N138 Other obstructive and reflux uropathy: Secondary | ICD-10-CM | POA: Diagnosis not present

## 2021-08-29 LAB — BLADDER SCAN AMB NON-IMAGING

## 2021-08-29 MED ORDER — TADALAFIL 5 MG PO TABS: 5.0000 mg | ORAL_TABLET | Freq: Every day | ORAL | 3 refills | Status: DC

## 2021-08-29 NOTE — Progress Notes (Signed)
   08/29/2021 4:40 PM   Patrick [name] 1940-08-06 [id number]  Reason for visit: Follow up BPH, ED  HPI: I saw [name] in urology clinic for follow-up of the above issues.  He is a 81 year old male with mechanical valve on Coumadin who underwent a PAE in February 2020 at [facility] for a 120 g prostate.  He did very well after this procedure and strength of urinary stream has improved significantly.  He reports maybe a 5 to 10% worsening of his symptoms over the last year.  PVR today is normal at 90 mL.  His primary issue is some occasional urgency and some weakening of the stream.  He takes Cialis 5 mg daily which she feels improves the strength of the urinary stream.  He has erectile dysfunction as well but has not seen a significant improvement on the Cialis, even when increasing the dose to 20 mg.  He is not interested in other treatments for ED at this point.  We discussed other options like a trial of Flomax but he is not bothered enough at this time to consider additional medications.  He is also interested in pursuing a repeat PAE in the future if he has worsening urinary symptoms.  Cialis refilled RTC 1 year PVR   Billey Co, MD  [facility] [address], [place], [place] [address] [phone]

## 2021-09-03 DIAGNOSIS — L821 Other seborrheic keratosis: Secondary | ICD-10-CM | POA: Diagnosis not present

## 2021-09-03 DIAGNOSIS — D485 Neoplasm of uncertain behavior of skin: Secondary | ICD-10-CM | POA: Diagnosis not present

## 2021-09-03 DIAGNOSIS — L905 Scar conditions and fibrosis of skin: Secondary | ICD-10-CM | POA: Diagnosis not present

## 2021-09-03 DIAGNOSIS — Z85828 Personal history of other malignant neoplasm of skin: Secondary | ICD-10-CM | POA: Diagnosis not present

## 2021-09-07 ENCOUNTER — Other Ambulatory Visit: Payer: Self-pay | Admitting: Cardiovascular Disease

## 2021-09-07 DIAGNOSIS — Z5181 Encounter for therapeutic drug level monitoring: Secondary | ICD-10-CM

## 2021-09-09 NOTE — Telephone Encounter (Signed)
Prescription refill request received for warfarin Lov: 09/21/20 Patrick [name])  Next INR check: 09/17/21 Warfarin tablet strength: 7.'5mg'$   Appropriate dose and refill sent to requested pharmacy.

## 2021-09-13 ENCOUNTER — Ambulatory Visit: Payer: Medicare Other

## 2021-09-16 ENCOUNTER — Encounter: Payer: Self-pay | Admitting: Internal Medicine
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
## 2021-09-17 ENCOUNTER — Ambulatory Visit: Payer: Medicare Other | Attending: Family Medicine

## 2021-09-17 ENCOUNTER — Ambulatory Visit (INDEPENDENT_AMBULATORY_CARE_PROVIDER_SITE_OTHER): Payer: Medicare Other

## 2021-09-17 DIAGNOSIS — Z5181 Encounter for therapeutic drug level monitoring: Secondary | ICD-10-CM | POA: Diagnosis not present

## 2021-09-17 DIAGNOSIS — M62830 Muscle spasm of back: Secondary | ICD-10-CM | POA: Diagnosis not present

## 2021-09-17 DIAGNOSIS — M5416 Radiculopathy, lumbar region: Secondary | ICD-10-CM | POA: Diagnosis not present

## 2021-09-17 DIAGNOSIS — I359 Nonrheumatic aortic valve disorder, unspecified: Secondary | ICD-10-CM | POA: Diagnosis not present

## 2021-09-17 DIAGNOSIS — M5459 Other low back pain: Secondary | ICD-10-CM | POA: Diagnosis not present

## 2021-09-17 DIAGNOSIS — I712 Thoracic aortic aneurysm, without rupture, unspecified: Secondary | ICD-10-CM | POA: Diagnosis not present

## 2021-09-17 DIAGNOSIS — R278 Other lack of coordination: Secondary | ICD-10-CM | POA: Diagnosis not present

## 2021-09-17 LAB — POCT INR: INR: 1.9 — AB (ref 2.0–3.0)

## 2021-09-17 NOTE — Patient Instructions (Signed)
Description   Take 1.5 tablets today, then resume same dosage 1 tablet daily except 1.5 tablets on Fridays. Recheck INR in 4 weeks. Call with any questions or new medications #[phone].

## 2021-09-23 DIAGNOSIS — Z961 Presence of intraocular lens: Secondary | ICD-10-CM | POA: Diagnosis not present

## 2021-09-23 DIAGNOSIS — H5213 Myopia, bilateral: Secondary | ICD-10-CM | POA: Diagnosis not present

## 2021-09-23 DIAGNOSIS — H26493 Other secondary cataract, bilateral: Secondary | ICD-10-CM | POA: Diagnosis not present

## 2021-09-25 ENCOUNTER — Ambulatory Visit: Payer: Medicare Other | Admitting: Hematology & Oncology

## 2021-09-25 ENCOUNTER — Ambulatory Visit: Payer: Medicare Other | Attending: Family Medicine | Admitting: Physical Therapy

## 2021-09-25 ENCOUNTER — Inpatient Hospital Stay: Payer: Medicare Other

## 2021-09-25 ENCOUNTER — Encounter: Payer: Self-pay | Admitting: Physical Therapy

## 2021-09-25 DIAGNOSIS — M62830 Muscle spasm of back: Secondary | ICD-10-CM | POA: Diagnosis not present

## 2021-09-25 DIAGNOSIS — R278 Other lack of coordination: Secondary | ICD-10-CM | POA: Insufficient documentation

## 2021-09-25 DIAGNOSIS — M5416 Radiculopathy, lumbar region: Secondary | ICD-10-CM | POA: Diagnosis not present

## 2021-09-25 DIAGNOSIS — M5459 Other low back pain: Secondary | ICD-10-CM | POA: Insufficient documentation
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
## 2021-09-26 ENCOUNTER — Encounter: Payer: Self-pay | Admitting: Hematology & Oncology

## 2021-09-26 ENCOUNTER — Inpatient Hospital Stay: Payer: Medicare Other | Attending: Hematology & Oncology

## 2021-09-26 ENCOUNTER — Inpatient Hospital Stay (HOSPITAL_BASED_OUTPATIENT_CLINIC_OR_DEPARTMENT_OTHER): Payer: Medicare Other | Admitting: Hematology & Oncology

## 2021-09-26 ENCOUNTER — Other Ambulatory Visit: Payer: Self-pay

## 2021-09-26 VITALS — BP 124/94 | HR 58 | Temp 97.7°F | Resp 18 | Ht 72.0 in | Wt 173.1 lb

## 2021-09-26 DIAGNOSIS — D72819 Decreased white blood cell count, unspecified: Secondary | ICD-10-CM | POA: Insufficient documentation

## 2021-09-26 DIAGNOSIS — Z7901 Long term (current) use of anticoagulants: Secondary | ICD-10-CM | POA: Diagnosis not present

## 2021-09-26 DIAGNOSIS — D696 Thrombocytopenia, unspecified: Secondary | ICD-10-CM | POA: Insufficient documentation

## 2021-09-26 DIAGNOSIS — D72810 Lymphocytopenia: Secondary | ICD-10-CM

## 2021-09-26 LAB — CMP (CANCER CENTER ONLY)
ALT: 17 U/L (ref 0–44)
AST: 18 U/L (ref 15–41)
Albumin: 4.6 g/dL (ref 3.5–5.0)
Alkaline Phosphatase: 65 U/L (ref 38–126)
Anion gap: 5 (ref 5–15)
BUN: 14 mg/dL (ref 8–23)
CO2: 31 mmol/L (ref 22–32)
Calcium: 10 mg/dL (ref 8.9–10.3)
Chloride: 104 mmol/L (ref 98–111)
Creatinine: 1.26 mg/dL — ABNORMAL HIGH (ref 0.61–1.24)
GFR, Estimated: 58 mL/min — ABNORMAL LOW (ref >60)
Glucose, Bld: 135 mg/dL — ABNORMAL HIGH (ref 70–99)
Potassium: 4.5 mmol/L (ref 3.5–5.1)
Sodium: 140 mmol/L (ref 135–145)
Total Bilirubin: 0.8 mg/dL (ref 0.3–1.2)
Total Protein: 6.3 g/dL — ABNORMAL LOW (ref 6.5–8.1)

## 2021-09-26 LAB — CBC WITH DIFFERENTIAL (CANCER CENTER ONLY)
Abs Immature Granulocytes: 0.05 10*3/uL (ref 0.00–0.07)
Basophils Absolute: 0 10*3/uL (ref 0.0–0.1)
Basophils Relative: 0 %
Eosinophils Absolute: 0 10*3/uL (ref 0.0–0.5)
Eosinophils Relative: 1 %
HCT: 38.2 % — ABNORMAL LOW (ref 39.0–52.0)
Hemoglobin: 13.4 g/dL (ref 13.0–17.0)
Immature Granulocytes: 2 %
Lymphocytes Relative: 16 %
Lymphs Abs: 0.4 10*3/uL — ABNORMAL LOW (ref 0.7–4.0)
MCH: 35.2 pg — ABNORMAL HIGH (ref 26.0–34.0)
MCHC: 35.1 g/dL (ref 30.0–36.0)
MCV: 100.3 fL — ABNORMAL HIGH (ref 80.0–100.0)
Monocytes Absolute: 0.2 10*3/uL (ref 0.1–1.0)
Monocytes Relative: 7 %
Neutro Abs: 1.6 10*3/uL — ABNORMAL LOW (ref 1.7–7.7)
Neutrophils Relative %: 74 %
Platelet Count: 52 10*3/uL — ABNORMAL LOW (ref 150–400)
RBC: 3.81 MIL/uL — ABNORMAL LOW (ref 4.22–5.81)
RDW: 14.7 % (ref 11.5–15.5)
WBC Count: 2.3 10*3/uL — ABNORMAL LOW (ref 4.0–10.5)
nRBC: 0 % (ref 0.0–0.2)

## 2021-09-26 LAB — IRON AND IRON BINDING CAPACITY (CC-WL,HP ONLY)
Iron: 154 ug/dL (ref 45–182)
Saturation Ratios: 56 % — ABNORMAL HIGH (ref 17.9–39.5)
TIBC: 273 ug/dL (ref 250–450)
UIBC: 119 ug/dL (ref 117–376)

## 2021-09-26 LAB — RETICULOCYTES
Immature Retic Fract: 19.8 % — ABNORMAL HIGH (ref 2.3–15.9)
RBC.: 3.8 MIL/uL — ABNORMAL LOW (ref 4.22–5.81)
Retic Count, Absolute: 76.8 10*3/uL (ref 19.0–186.0)
Retic Ct Pct: 2 % (ref 0.4–3.1)

## 2021-09-26 LAB — FERRITIN: Ferritin: 438 ng/mL — ABNORMAL HIGH (ref 24–336)

## 2021-09-26 LAB — SAVE SMEAR(SSMR), FOR PROVIDER SLIDE REVIEW
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
## 2021-09-26 NOTE — Progress Notes (Signed)
Hematology and Oncology Follow Up Visit  Patrick [name] [id number] [birth year] 81 y.o. 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  As always, he will be traveling.  He and his wife are going over this weekend for a wedding.  He will be going to [place] to visit some friends.  He has some granddaughters over in [place] right now.  He has back problems.  He is doing rehabilitation for his back.  He is doing a lot of stretching.  He is on Coumadin because of his mechanical heart valve.  He has had little of a hard time getting the INR regulated.  He has had no problems with nausea or vomiting.  He has had no problems with bowels or bladder.  He has had no rashes.  He has been no bleeding or bruising.  Overall, I would say his performance status is probably ECOG 1.    Medications:  Current Outpatient Medications:    amLODipine (NORVASC) 5 MG tablet, TAKE ONE TABLET BY MOUTH ONE TIME DAILY, Disp: 90 tablet, Rfl: 3   atorvastatin (LIPITOR) 10 MG tablet, Take 1 tablet (10 mg total) by mouth daily. Follow-up on Lipids is due must see MD fore refills, Disp: 30 tablet, Rfl: 0   pantoprazole (PROTONIX) 40 MG tablet, TAKE ONE TABLET BY MOUTH ONE TIME DAILY, Disp: 90 tablet, Rfl: 3   tadalafil (CIALIS) 5 MG tablet, Take 1 tablet (5 mg total) by mouth daily., Disp: 90 tablet, Rfl: 3   warfarin (JANTOVEN) 7.5 MG tablet, TAKE 1 TO  1 1/2 TABLETS BY MOUTH DAILY OR AS DIRECTED BY THE ANTICOAGULATION CLINIC, Disp: 100 tablet, Rfl: 1  Allergies:  Allergies  Allergen Reactions   Codeine Rash   Terbinafine Itching    Past Medical History, Surgical history, Social history, and Family History were reviewed and updated.  Review of Systems: Review of Systems  Constitutional: Negative.   HENT:  Negative.    Eyes: Negative.   Respiratory: Negative.    Cardiovascular: Negative.    Gastrointestinal: Negative.   Endocrine: Negative.   Genitourinary: Negative.    Musculoskeletal: Negative.   Skin: Negative.   Neurological: Negative.   Hematological: Negative.   Psychiatric/Behavioral: Negative.      Physical Exam:  height is 6' (1.829 m) and weight is 173 lb 1.9 oz (78.5 kg). His oral temperature is 97.7 F (36.5 C). His blood pressure is 124/94 (abnormal) and his pulse is 58 (abnormal). His respiration is 18 and oxygen saturation is 100%.   Wt Readings from Last 3 Encounters:  09/26/21 173 lb 1.9 oz (78.5 kg)  08/29/21 170 lb (77.1 kg)  06/25/21 172 lb 8 oz (78.2 kg)    Physical Exam Vitals reviewed.  HENT:     Head: Normocephalic and atraumatic.  Eyes:     Pupils: Pupils are equal, round, and reactive to light.  Cardiovascular:     Rate and Rhythm: Normal rate and regular rhythm.     Heart sounds: Normal heart sounds.     Comments: Cardiac exam shows a regular rate and rhythm.  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  I hear no murmurs or rubs or bruits. Pulmonary:     Effort: Pulmonary effort is normal.     Breath sounds: Normal breath sounds.  Abdominal:     General: Bowel  sounds are normal.     Palpations: Abdomen is soft.  Musculoskeletal:        General: No tenderness or deformity. Normal range of motion.     Cervical back: Normal range of motion.  Lymphadenopathy:     Cervical: No cervical adenopathy.  Skin:    General: Skin is warm and dry.     Findings: No erythema or rash.  Neurological:     Mental Status: He is alert and oriented to person, place, and time.  Psychiatric:        Behavior: Behavior normal.        Thought Content: Thought content normal.        Judgment: Judgment normal.      Lab Results  Component Value Date   WBC 2.3 (L) 09/26/2021   HGB 13.4 09/26/2021   HCT 38.2 (L) 09/26/2021   MCV 100.3 (H) 09/26/2021   PLT 52 (L) 09/26/2021     Chemistry      Component Value Date/Time   NA  140 09/26/2021 0924   NA 139 11/12/2020 1030   K 4.5 09/26/2021 0924   CL 104 09/26/2021 0924   CO2 31 09/26/2021 0924   BUN 14 09/26/2021 0924   BUN 16 11/12/2020 1030   CREATININE 1.26 (H) 09/26/2021 0924      Component Value Date/Time   CALCIUM 10.0 09/26/2021 0924   ALKPHOS 65 09/26/2021 0924   AST 18 09/26/2021 0924   ALT 17 09/26/2021 0924   BILITOT 0.8 09/26/2021 0924       Impression and Plan: Patrick [name] is a 81 year old white male.  He has mild leukopenia and thrombocytopenia.  I looked at his blood smear.  I really do not see anything that looked suspicious on the blood smear.  In reality, his blood counts are holding pretty steady.  In 6 months, there really has been no change in his white cell count or his platelets.  For right now, we will still plan to get him back in about 3 or 4 months.  I would like to get back before the Holiday season make sure everything is fine.    Volanda Napoleon, MD 8/3/202310:16 AM

## 2021-09-27 ENCOUNTER — Ambulatory Visit: Payer: Medicare Other

## 2021-09-27 DIAGNOSIS — R278 Other lack of coordination: Secondary | ICD-10-CM | POA: Diagnosis not present

## 2021-09-27 DIAGNOSIS — M62830 Muscle spasm of back: Secondary | ICD-10-CM | POA: Diagnosis not present

## 2021-09-27 DIAGNOSIS — M5416 Radiculopathy, lumbar region: Secondary | ICD-10-CM

## 2021-09-27 DIAGNOSIS — M5459 Other low back pain: Secondary | ICD-10-CM

## 2021-09-30 ENCOUNTER — Ambulatory Visit: Payer: Medicare Other | Admitting: Physical Therapy

## 2021-09-30 ENCOUNTER — Encounter: Payer: Self-pay | Admitting: Physical Therapy

## 2021-09-30 DIAGNOSIS — M62830 Muscle spasm of back: Secondary | ICD-10-CM

## 2021-09-30 DIAGNOSIS — R278 Other lack of coordination: Secondary | ICD-10-CM | POA: Diagnosis not present

## 2021-09-30 DIAGNOSIS — M5416 Radiculopathy, lumbar region: Secondary | ICD-10-CM | POA: Diagnosis not present

## 2021-09-30 DIAGNOSIS — M5459 Other low back pain: Secondary | ICD-10-CM | POA: Diagnosis not present
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
## 2021-10-03 ENCOUNTER — Ambulatory Visit: Payer: Medicare Other | Admitting: Physical Therapy

## 2021-10-03 DIAGNOSIS — M5459 Other low back pain: Secondary | ICD-10-CM

## 2021-10-03 DIAGNOSIS — M5416 Radiculopathy, lumbar region: Secondary | ICD-10-CM | POA: Diagnosis not present

## 2021-10-03 DIAGNOSIS — R278 Other lack of coordination: Secondary | ICD-10-CM | POA: Diagnosis not present

## 2021-10-03 DIAGNOSIS — M62830 Muscle spasm of back: Secondary | ICD-10-CM | POA: Diagnosis not present
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
## 2021-10-08 ENCOUNTER — Ambulatory Visit: Payer: Medicare Other | Admitting: Physical Therapy

## 2021-10-10 ENCOUNTER — Ambulatory Visit: Payer: Medicare Other | Admitting: Physical Therapy

## 2021-10-10 ENCOUNTER — Encounter: Payer: Self-pay | Admitting: Physical Therapy

## 2021-10-10 DIAGNOSIS — R278 Other lack of coordination: Secondary | ICD-10-CM | POA: Diagnosis not present

## 2021-10-10 DIAGNOSIS — M62830 Muscle spasm of back: Secondary | ICD-10-CM | POA: Diagnosis not present

## 2021-10-10 DIAGNOSIS — M5459 Other low back pain: Secondary | ICD-10-CM

## 2021-10-10 DIAGNOSIS — M5416 Radiculopathy, lumbar region: Secondary | ICD-10-CM
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
## 2021-10-15 ENCOUNTER — Ambulatory Visit (INDEPENDENT_AMBULATORY_CARE_PROVIDER_SITE_OTHER): Payer: Medicare Other | Admitting: *Deleted

## 2021-10-15 DIAGNOSIS — I712 Thoracic aortic aneurysm, without rupture, unspecified: Secondary | ICD-10-CM | POA: Diagnosis not present

## 2021-10-15 DIAGNOSIS — Z5181 Encounter for therapeutic drug level monitoring: Secondary | ICD-10-CM | POA: Diagnosis not present

## 2021-10-15 DIAGNOSIS — I359 Nonrheumatic aortic valve disorder, unspecified: Secondary | ICD-10-CM

## 2021-10-15 LAB — POCT INR: INR: 2.6 (ref 2.0–3.0)

## 2021-10-15 NOTE — Patient Instructions (Signed)
Description   Continue taking 1 tablet daily except 1.5 tablets on Fridays. Recheck INR in 5 weeks. Call with any questions or new medications #[phone].

## 2021-10-31 ENCOUNTER — Other Ambulatory Visit: Payer: Self-pay | Admitting: Internal Medicine

## 2021-11-13 ENCOUNTER — Encounter: Payer: Self-pay | Admitting: Cardiovascular Disease

## 2021-11-13 ENCOUNTER — Ambulatory Visit: Payer: Medicare Other | Attending: Cardiovascular Disease | Admitting: Cardiovascular Disease

## 2021-11-13 VITALS — BP 145/87 | HR 54 | Ht 72.0 in | Wt 173.0 lb

## 2021-11-13 DIAGNOSIS — I10 Essential (primary) hypertension: Secondary | ICD-10-CM | POA: Diagnosis not present

## 2021-11-13 DIAGNOSIS — I359 Nonrheumatic aortic valve disorder, unspecified: Secondary | ICD-10-CM

## 2021-11-13 DIAGNOSIS — I440 Atrioventricular block, first degree: Secondary | ICD-10-CM | POA: Diagnosis not present

## 2021-11-13 NOTE — Patient Instructions (Signed)
Medication Instructions:  No changes *If you need a refill on your cardiac medications before your next appointment, please call your pharmacy*   Lab Work: none If you have labs (blood work) drawn today and your tests are completely normal, you will receive your results only by: MyChart Message (if you have MyChart) OR A paper copy in the mail If you have any lab test that is abnormal or we need to change your treatment, we will call you to review the results.   Testing/Procedures: none   Follow-Up: At [facility], you and your health needs are our priority.  As part of our continuing mission to provide you with exceptional heart care, we have created designated Provider Care Teams.  These Care Teams include your primary Cardiologist (physician) and Advanced Practice Providers (APPs -  Physician Assistants and Nurse Practitioners) who all work together to provide you with the care you need, when you need it.  We recommend signing up for the patient portal called "MyChart".  Sign up information is provided on this After Visit Summary.  MyChart is used to connect with patients for Virtual Visits (Telemedicine).  Patients are able to view lab/test results, encounter notes, upcoming appointments, etc.  Non-urgent messages can be sent to your provider as well.   To learn more about what you can do with MyChart, go to [email or web address].    Your next appointment:   12 month(s)  The format for your next appointment:   In Person  Provider:   Christopher McAlhany, MD     Other Instructions   Important Information About Sugar       
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
## 2021-11-18 ENCOUNTER — Other Ambulatory Visit: Payer: Self-pay

## 2021-11-18 ENCOUNTER — Encounter: Payer: Self-pay | Admitting: Family Medicine

## 2021-11-18 DIAGNOSIS — M5416 Radiculopathy, lumbar region: Secondary | ICD-10-CM

## 2021-11-19 ENCOUNTER — Ambulatory Visit: Payer: Medicare Other | Attending: Cardiovascular Disease

## 2021-11-19 DIAGNOSIS — I712 Thoracic aortic aneurysm, without rupture, unspecified: Secondary | ICD-10-CM | POA: Diagnosis not present

## 2021-11-19 DIAGNOSIS — Z5181 Encounter for therapeutic drug level monitoring: Secondary | ICD-10-CM | POA: Diagnosis not present

## 2021-11-19 DIAGNOSIS — I359 Nonrheumatic aortic valve disorder, unspecified: Secondary | ICD-10-CM | POA: Diagnosis not present

## 2021-11-19 LAB — POCT INR: INR: 1.9 — AB (ref 2.0–3.0)

## 2021-11-19 NOTE — Patient Instructions (Signed)
TAKE 2 TABLETS TODAY ONLY and then Continue taking 1 tablet daily except 1.5 tablets on Fridays. Recheck INR in 5 weeks. Call with any questions or new medications #[phone].

## 2021-12-24 ENCOUNTER — Ambulatory Visit: Payer: Medicare Other | Attending: Cardiovascular Disease | Admitting: *Deleted

## 2021-12-24 DIAGNOSIS — I712 Thoracic aortic aneurysm, without rupture, unspecified: Secondary | ICD-10-CM | POA: Diagnosis not present

## 2021-12-24 DIAGNOSIS — I359 Nonrheumatic aortic valve disorder, unspecified: Secondary | ICD-10-CM

## 2021-12-24 DIAGNOSIS — Z5181 Encounter for therapeutic drug level monitoring: Secondary | ICD-10-CM

## 2021-12-24 LAB — POCT INR: INR: 2.2 (ref 2.0–3.0)

## 2021-12-24 NOTE — Patient Instructions (Signed)
Description   Continue taking 1 tablet daily except 1.5 tablets on Fridays. Recheck INR in 6 weeks. Call with any questions or new medications #[phone].

## 2021-12-30 ENCOUNTER — Other Ambulatory Visit: Payer: Self-pay | Admitting: Cardiovascular Disease

## 2021-12-30 DIAGNOSIS — L218 Other seborrheic dermatitis: Secondary | ICD-10-CM | POA: Diagnosis not present

## 2021-12-30 DIAGNOSIS — L821 Other seborrheic keratosis: Secondary | ICD-10-CM | POA: Diagnosis not present

## 2021-12-30 DIAGNOSIS — D2262 Melanocytic nevi of left upper limb, including shoulder: Secondary | ICD-10-CM | POA: Diagnosis not present

## 2021-12-30 DIAGNOSIS — D2272 Melanocytic nevi of left lower limb, including hip: Secondary | ICD-10-CM | POA: Diagnosis not present

## 2021-12-30 DIAGNOSIS — L812 Freckles: Secondary | ICD-10-CM | POA: Diagnosis not present

## 2021-12-30 DIAGNOSIS — Z85828 Personal history of other malignant neoplasm of skin: Secondary | ICD-10-CM | POA: Diagnosis not present

## 2021-12-30 DIAGNOSIS — D225 Melanocytic nevi of trunk: Secondary | ICD-10-CM | POA: Diagnosis not present

## 2021-12-30 DIAGNOSIS — D2239 Melanocytic nevi of other parts of face: Secondary | ICD-10-CM | POA: Diagnosis not present

## 2021-12-30 DIAGNOSIS — D224 Melanocytic nevi of scalp and neck: Secondary | ICD-10-CM | POA: Diagnosis not present

## 2021-12-30 DIAGNOSIS — D2271 Melanocytic nevi of right lower limb, including hip: Secondary | ICD-10-CM | POA: Diagnosis not present

## 2021-12-30 DIAGNOSIS — D2261 Melanocytic nevi of right upper limb, including shoulder: Secondary | ICD-10-CM | POA: Diagnosis not present

## 2021-12-30 DIAGNOSIS — L814 Other melanin hyperpigmentation: Secondary | ICD-10-CM | POA: Diagnosis not present

## 2022-01-08 ENCOUNTER — Ambulatory Visit: Payer: Medicare Other | Admitting: Cardiovascular Disease

## 2022-01-09 ENCOUNTER — Other Ambulatory Visit: Payer: Self-pay

## 2022-01-09 ENCOUNTER — Inpatient Hospital Stay (HOSPITAL_BASED_OUTPATIENT_CLINIC_OR_DEPARTMENT_OTHER): Payer: Medicare Other | Admitting: Hematology & Oncology

## 2022-01-09 ENCOUNTER — Encounter: Payer: Self-pay | Admitting: Hematology & Oncology

## 2022-01-09 ENCOUNTER — Inpatient Hospital Stay: Payer: Medicare Other | Attending: Hematology & Oncology

## 2022-01-09 VITALS — BP 135/71 | HR 54 | Temp 97.6°F | Resp 18 | Ht 72.0 in | Wt 174.1 lb

## 2022-01-09 DIAGNOSIS — D696 Thrombocytopenia, unspecified: Secondary | ICD-10-CM

## 2022-01-09 DIAGNOSIS — D72819 Decreased white blood cell count, unspecified: Secondary | ICD-10-CM | POA: Insufficient documentation

## 2022-01-09 DIAGNOSIS — D709 Neutropenia, unspecified: Secondary | ICD-10-CM | POA: Diagnosis not present

## 2022-01-09 LAB — CBC WITH DIFFERENTIAL (CANCER CENTER ONLY)
Abs Immature Granulocytes: 0.01 10*3/uL (ref 0.00–0.07)
Basophils Absolute: 0 10*3/uL (ref 0.0–0.1)
Basophils Relative: 0 %
Eosinophils Absolute: 0 10*3/uL (ref 0.0–0.5)
Eosinophils Relative: 0 %
HCT: 36.1 % — ABNORMAL LOW (ref 39.0–52.0)
Hemoglobin: 12.5 g/dL — ABNORMAL LOW (ref 13.0–17.0)
Immature Granulocytes: 0 %
Lymphocytes Relative: 13 %
Lymphs Abs: 0.3 10*3/uL — ABNORMAL LOW (ref 0.7–4.0)
MCH: 34.9 pg — ABNORMAL HIGH (ref 26.0–34.0)
MCHC: 34.6 g/dL (ref 30.0–36.0)
MCV: 100.8 fL — ABNORMAL HIGH (ref 80.0–100.0)
Monocytes Absolute: 0.2 10*3/uL (ref 0.1–1.0)
Monocytes Relative: 7 %
Neutro Abs: 2 10*3/uL (ref 1.7–7.7)
Neutrophils Relative %: 80 %
Platelet Count: 49 10*3/uL — ABNORMAL LOW (ref 150–400)
RBC: 3.58 MIL/uL — ABNORMAL LOW (ref 4.22–5.81)
RDW: 15.3 % (ref 11.5–15.5)
WBC Count: 2.5 10*3/uL — ABNORMAL LOW (ref 4.0–10.5)
nRBC: 0 % (ref 0.0–0.2)

## 2022-01-09 LAB — CMP (CANCER CENTER ONLY)
ALT: 18 U/L (ref 0–44)
AST: 17 U/L (ref 15–41)
Albumin: 4.3 g/dL (ref 3.5–5.0)
Alkaline Phosphatase: 72 U/L (ref 38–126)
Anion gap: 5 (ref 5–15)
BUN: 16 mg/dL (ref 8–23)
CO2: 29 mmol/L (ref 22–32)
Calcium: 9.5 mg/dL (ref 8.9–10.3)
Chloride: 106 mmol/L (ref 98–111)
Creatinine: 1.19 mg/dL (ref 0.61–1.24)
GFR, Estimated: >60 mL/min (ref >60)
Glucose, Bld: 153 mg/dL — ABNORMAL HIGH (ref 70–99)
Potassium: 4.4 mmol/L (ref 3.5–5.1)
Sodium: 140 mmol/L (ref 135–145)
Total Bilirubin: 0.6 mg/dL (ref 0.3–1.2)
Total Protein: 6.2 g/dL — ABNORMAL LOW (ref 6.5–8.1)

## 2022-01-09 LAB — SAVE SMEAR(SSMR), FOR PROVIDER SLIDE REVIEW

## 2022-01-09 LAB — LACTATE DEHYDROGENASE: LDH: 248 U/L — ABNORMAL HIGH (ref 98–192)

## 2022-01-09 NOTE — Progress Notes (Signed)
Hematology and Oncology Follow Up Visit  Patrick [name] [id number] 01-20-1941 80 y.o. 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  We saw him 3 months ago.  Patrick [name] has always been traveling.  Patrick [name] is going to stay put over the Thanksgiving holiday.  Patrick [name] will be going to [place] in December.  Patrick [name] has had no problems with bleeding or bruising.  Patrick [name] has INR checked last week because of Coumadin.  The INR was 2.4.  His last iron studies that we did on him back in August showed a ferritin of 438 with iron saturation of 56%.    His appetite is good.  Patrick [name] has had no nausea or vomiting.  There is no change in bowel or bladder habits.  Patrick [name] has had no rashes.  Patrick [name] has had no issues with COVID.  Overall, I would say his performance status is probably ECOG 1.    Medications:  Current Outpatient Medications:    amLODipine (NORVASC) 5 MG tablet, TAKE ONE TABLET BY MOUTH ONE TIME DAILY, Disp: 90 tablet, Rfl: 2   atorvastatin (LIPITOR) 10 MG tablet, Take 1 tablet (10 mg total) by mouth daily. Follow-up on Lipids is due must see MD fore refills, Disp: 30 tablet, Rfl: 0   pantoprazole (PROTONIX) 40 MG tablet, TAKE ONE TABLET BY MOUTH ONE TIME DAILY, Disp: 90 tablet, Rfl: 1   tadalafil (CIALIS) 5 MG tablet, Take 1 tablet (5 mg total) by mouth daily., Disp: 90 tablet, Rfl: 3   warfarin (JANTOVEN) 7.5 MG tablet, TAKE 1 TO  1 1/2 TABLETS BY MOUTH DAILY OR AS DIRECTED BY THE ANTICOAGULATION CLINIC (Patient taking differently: Take 7.5 mg by mouth daily. TAKE 1 DAILY and 1 1/2 TABLETS ON FRIDAY BY MOUTH AS DIRECTED BY THE ANTICOAGULATION CLINIC), Disp: 100 tablet, Rfl: 1  Allergies:  Allergies  Allergen Reactions   Codeine Rash   Terbinafine Itching    Past Medical History, Surgical history, Social history, and Family History were reviewed and updated.  Review of  Systems: Review of Systems  Constitutional: Negative.   HENT:  Negative.    Eyes: Negative.   Respiratory: Negative.    Cardiovascular: Negative.   Gastrointestinal: Negative.   Endocrine: Negative.   Genitourinary: Negative.    Musculoskeletal: Negative.   Skin: Negative.   Neurological: Negative.   Hematological: Negative.   Psychiatric/Behavioral: Negative.      Physical Exam:  height is 6' (1.829 m) and weight is 174 lb 1.3 oz (79 kg). His oral temperature is 97.6 F (36.4 C). His blood pressure is 135/71 and his pulse is 54 (abnormal). His respiration is 18 and oxygen saturation is 100%.   Wt Readings from Last 3 Encounters:  01/09/22 174 lb 1.3 oz (79 kg)  11/13/21 173 lb (78.5 kg)  09/26/21 173 lb 1.9 oz (78.5 kg)    Physical Exam Vitals reviewed.  HENT:     Head: Normocephalic and atraumatic.  Eyes:     Pupils: Pupils are equal, round, and reactive to light.  Cardiovascular:     Rate and Rhythm: Normal rate and regular rhythm.     Heart sounds: Normal heart sounds.     Comments: Cardiac exam shows a regular rate and rhythm.  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  I hear no murmurs or rubs or bruits.  Pulmonary:     Effort: Pulmonary effort is normal.     Breath sounds: Normal breath sounds.  Abdominal:     General: Bowel sounds are normal.     Palpations: Abdomen is soft.  Musculoskeletal:        General: No tenderness or deformity. Normal range of motion.     Cervical back: Normal range of motion.  Lymphadenopathy:     Cervical: No cervical adenopathy.  Skin:    General: Skin is warm and dry.     Findings: No erythema or rash.  Neurological:     Mental Status: Patrick [name] is alert and oriented to person, place, and time.  Psychiatric:        Behavior: Behavior normal.        Thought Content: Thought content normal.        Judgment: Judgment normal.      Lab Results  Component Value Date   WBC 2.5 (L) 01/09/2022   HGB 12.5 (L)  01/09/2022   HCT 36.1 (L) 01/09/2022   MCV 100.8 (H) 01/09/2022   PLT 49 (L) 01/09/2022     Chemistry      Component Value Date/Time   NA 140 01/09/2022 0942   NA 139 11/12/2020 1030   K 4.4 01/09/2022 0942   CL 106 01/09/2022 0942   CO2 29 01/09/2022 0942   BUN 16 01/09/2022 0942   BUN 16 11/12/2020 1030   CREATININE 1.19 01/09/2022 0942      Component Value Date/Time   CALCIUM 9.5 01/09/2022 0942   ALKPHOS 72 01/09/2022 0942   AST 17 01/09/2022 0942   ALT 18 01/09/2022 0942   BILITOT 0.6 01/09/2022 0942       Impression and Plan: Patrick [name] is a 81 year old white male.  Patrick [name] has mild leukopenia and thrombocytopenia.  Patrick [name] does have a couple abnormalities on NGS testing.  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  For right now, we will continue to follow him along.  I do not see that we have to embark upon any intervention.  I will plan to get her back in 4 months now.  We will try to get him through the Winter.    I still do not see a need to do a bone marrow test on him.    Volanda Napoleon, MD 11/16/202310:22 AM

## 2022-01-14 ENCOUNTER — Ambulatory Visit (INDEPENDENT_AMBULATORY_CARE_PROVIDER_SITE_OTHER): Payer: Medicare Other

## 2022-01-14 DIAGNOSIS — Z23 Encounter for immunization: Secondary | ICD-10-CM | POA: Diagnosis not present

## 2022-01-14 NOTE — Progress Notes (Signed)
Pt received flu vaccine w/o any complications

## 2022-01-17 ENCOUNTER — Other Ambulatory Visit: Payer: Self-pay | Admitting: Cardiovascular Disease

## 2022-01-17 DIAGNOSIS — Z5181 Encounter for therapeutic drug level monitoring: Secondary | ICD-10-CM

## 2022-01-20 NOTE — Telephone Encounter (Signed)
Last INR 12/24/21  Last OV 11/13/21

## 2022-02-06 ENCOUNTER — Ambulatory Visit: Payer: Medicare Other | Attending: Cardiovascular Disease | Admitting: *Deleted

## 2022-02-06 DIAGNOSIS — I712 Thoracic aortic aneurysm, without rupture, unspecified: Secondary | ICD-10-CM

## 2022-02-06 DIAGNOSIS — I359 Nonrheumatic aortic valve disorder, unspecified: Secondary | ICD-10-CM

## 2022-02-06 DIAGNOSIS — Z5181 Encounter for therapeutic drug level monitoring: Secondary | ICD-10-CM

## 2022-02-06 LAB — POCT INR: INR: 1.6 — AB (ref 2.0–3.0)

## 2022-02-06 MED ORDER — WARFARIN SODIUM 7.5 MG PO TABS: ORAL_TABLET | ORAL | 1 refills | Status: DC

## 2022-02-06 NOTE — Patient Instructions (Signed)
Description   Today take 1.5 tablets then continue taking 1 tablet daily except 1.5 tablets on Fridays. Recheck INR in 4 weeks. Call with any questions or new medications #[phone].

## 2022-02-13 ENCOUNTER — Telehealth: Payer: Self-pay | Admitting: *Deleted

## 2022-02-13 ENCOUNTER — Encounter: Payer: Self-pay | Admitting: Cardiovascular Disease

## 2022-02-13 DIAGNOSIS — H26492 Other secondary cataract, left eye: Secondary | ICD-10-CM | POA: Diagnosis not present

## 2022-02-13 DIAGNOSIS — H26491 Other secondary cataract, right eye: Secondary | ICD-10-CM | POA: Diagnosis not present

## 2022-02-13 NOTE — Telephone Encounter (Signed)
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    Procedure: lumbar epidural Date of procedure: TBD  CrCl 35m/min Platelet count 49K  Per office protocol, patient can hold warfarin for 5 days prior to procedure. Patient will NOT need bridging with Lovenox around procedure. He will need INR checked the day before or morning of procedure though per requesting office once procedure date has been scheduled - will forward to Coumadin clinic to help coordinate once procedure date is set.  **This guidance is not considered finalized until pre-operative APP has relayed final recommendations.**

## 2022-02-13 NOTE — Telephone Encounter (Signed)
   Pre-operative Risk Assessment    Patient Name: Patrick [name]  DOB: [birth year] MRN: [id number]      Request for Surgical Clearance    Procedure:   LUMBAR EPIDURAL  Date of Surgery:  Clearance TBD                                 Surgeon:   Surgeon's Group or Practice Name:  [facility] Phone number:  [phone] Fax number:  [phone]   Type of Clearance Requested:   - Pharmacy:  Hold Warfarin (Coumadin) X'S 4 DAYS   Type of Anesthesia:  Not Indicated   Additional requests/questions:   THEY HAVE A NOTE THAT INR TO BE DRAWN THE EVENING BEFORE OR THE MORNING OF THE PROCEDURE  Signed, Jeanann Lewandowsky   02/13/2022, 8:49 AM

## 2022-02-14 ENCOUNTER — Telehealth: Payer: Self-pay | Admitting: *Deleted

## 2022-02-14 NOTE — Telephone Encounter (Signed)
Patient was scheduled 12-28-3 2pm  Med Rec and consent done [facility]

## 2022-02-14 NOTE — Telephone Encounter (Signed)
   Name: Patrick [name]  DOB: 1940/04/08  MRN: [id number]  Primary Cardiologist: Lauree Chandler, MD   Preoperative team, please contact this patient and set up a phone call appointment for further preoperative risk assessment. Please obtain consent and complete medication review. Thank you for your help.  I confirm that guidance regarding antiplatelet and oral anticoagulation therapy has been completed and, if necessary, noted below.  Pharmacy has addressed anticoagulation request.   Deberah Pelton, NP 02/14/2022, 8:39 AM [place]

## 2022-02-14 NOTE — Telephone Encounter (Signed)
Patient was scheduled 12-28-3 2pm  Med Rec and consent done [facility]    Patient Consent for Virtual Visit        Patrick [name] has provided verbal consent on 02/14/2022 for a virtual visit (video or telephone).   CONSENT FOR VIRTUAL VISIT FOR:  Patrick [name]  By participating in this virtual visit I agree to the following:  I hereby voluntarily request, consent and authorize [facility] and its employed or contracted physicians, physician assistants, nurse practitioners or other licensed health care professionals (the Practitioner), to provide me with telemedicine health care services (the "Services") as deemed necessary by the treating Practitioner. I acknowledge and consent to receive the Services by the Practitioner via telemedicine. I understand that the telemedicine visit will involve communicating with the Practitioner through live audiovisual communication technology and the disclosure of certain medical information by electronic transmission. I acknowledge that I have been given the opportunity to request an in-person assessment or other available alternative prior to the telemedicine visit and am voluntarily participating in the telemedicine visit.  I understand that I have the right to withhold or withdraw my consent to the use of telemedicine in the course of my care at any time, without affecting my right to future care or treatment, and that the Practitioner or I may terminate the telemedicine visit at any time. I understand that I have the right to inspect all information obtained and/or recorded in the course of the telemedicine visit and may receive copies of available information for a reasonable fee.  I understand that some of the potential risks of receiving the Services via telemedicine include:  Delay or interruption in medical evaluation due to technological equipment failure or disruption; Information transmitted may not be sufficient (e.g. poor resolution of images) to  allow for appropriate medical decision making by the Practitioner; and/or  In rare instances, security protocols could fail, causing a breach of personal health information.  Furthermore, I acknowledge that it is my responsibility to provide information about my medical history, conditions and care that is complete and accurate to the best of my ability. I acknowledge that Practitioner's advice, recommendations, and/or decision may be based on factors not within their control, such as incomplete or inaccurate data provided by me or distortions of diagnostic images or specimens that may result from electronic transmissions. I understand that the practice of medicine is not an exact science and that Practitioner makes no warranties or guarantees regarding treatment outcomes. I acknowledge that a copy of this consent can be made available to me via my patient portal ([facility]), or I can request a printed copy by calling the office of [facility].    I understand that my insurance will be billed for this visit.   I have read or had this consent read to me. I understand the contents of this consent, which adequately explains the benefits and risks of the Services being provided via telemedicine.  I have been provided ample opportunity to ask questions regarding this consent and the Services and have had my questions answered to my satisfaction. I give my informed consent for the services to be provided through the use of telemedicine in my medical care

## 2022-02-20 ENCOUNTER — Ambulatory Visit: Payer: Medicare Other | Admitting: Nurse Practitioner

## 2022-02-20 NOTE — Telephone Encounter (Signed)
   Patient Name: Patrick [name]  DOB: [birth year] MRN: [id number]  Primary Cardiologist: Lauree Chandler, MD  Clinical pharmacists have reviewed the patient's past medical history, labs, and current medications as part of preoperative protocol coverage. The following recommendations have been made:  Per office protocol, patient can hold warfarin for 5 days prior to procedure. Patient will NOT need bridging with Lovenox around procedure. He will need INR checked the day before or morning of procedure though per requesting office once procedure date has been scheduled - this message was forwarded to our Coumadin clinic to help coordinate once procedure date is set.   I will route this recommendation to the requesting party via Epic fax function and remove from pre-op pool.  Please call with questions.  Lenna Sciara, NP 02/20/2022, 10:49 AM

## 2022-02-21 ENCOUNTER — Telehealth: Payer: Self-pay | Admitting: *Deleted

## 2022-02-21 NOTE — Telephone Encounter (Signed)
Pt states that he was just called by [facility] for the TBD procedure and they have scheduled it for next Tues and they want him to come off of his medications tonight. He was making sure this was okay and to give update on date of procedure.

## 2022-02-21 NOTE — Telephone Encounter (Signed)
Pt called in today and asked could he hold his warfarin as of today for procedure Tues. I have read notes and saw what was to be believed that the procedure was 03/06/22, not 02/25/22. I am going to send to pre op and pre op pharm-d.

## 2022-02-21 NOTE — Telephone Encounter (Signed)
Pt called and stated that he is having a procedure on 1/3 and needs his INR checked the day of the procedure. Pt is starting to hold his warfarin tonight 02/21/2022.   Please see clearance note from 02/13/2022

## 2022-02-21 NOTE — Telephone Encounter (Signed)
I s/w the pt and he states he just s/w Tye [place] at [facility] and she told him his procedure is 02/26/22. I stated to the pt that we, his cardiology office were never updated to the date. Pt has been made aware he can begin holding his warfarin as of tonight. Pt has been informed though that he needs to reschedule his 03/06/21 INR that was for pre op to INR check to be done on 02/26/22 now before he can proceed with his procedure. I gave pt the ph to [facility] [phone]. Pt states he will call right now.   I will update requesting office of the notes from today's call.

## 2022-02-26 ENCOUNTER — Ambulatory Visit: Payer: Medicare Other | Attending: Internal Medicine | Admitting: *Deleted

## 2022-02-26 ENCOUNTER — Encounter: Payer: Self-pay | Admitting: Family Medicine

## 2022-02-26 ENCOUNTER — Ambulatory Visit
Admission: RE | Admit: 2022-02-26 | Discharge: 2022-02-26 | Disposition: A | Discharge status: Home / Self Care | Payer: Medicare Other | Source: Ambulatory Visit | Attending: Family Medicine | Admitting: Family Medicine

## 2022-02-26 DIAGNOSIS — I712 Thoracic aortic aneurysm, without rupture, unspecified: Secondary | ICD-10-CM | POA: Diagnosis not present

## 2022-02-26 DIAGNOSIS — M5416 Radiculopathy, lumbar region: Secondary | ICD-10-CM

## 2022-02-26 DIAGNOSIS — I359 Nonrheumatic aortic valve disorder, unspecified: Secondary | ICD-10-CM | POA: Diagnosis not present

## 2022-02-26 DIAGNOSIS — Z5181 Encounter for therapeutic drug level monitoring: Secondary | ICD-10-CM

## 2022-02-26 DIAGNOSIS — M47817 Spondylosis without myelopathy or radiculopathy, lumbosacral region: Secondary | ICD-10-CM | POA: Diagnosis not present

## 2022-02-26 LAB — POCT INR: INR: 1 — AB (ref 2.0–3.0)

## 2022-02-26 MED ORDER — IOPAMIDOL (ISOVUE-M 200) INJECTION 41%
1.0000 mL | Freq: Once | INTRAMUSCULAR | Status: AC
  Administered 2022-02-26: 1 mL via EPIDURAL

## 2022-02-26 MED ORDER — METHYLPREDNISOLONE ACETATE 40 MG/ML INJ SUSP (RADIOLOG
80.0000 mg | Freq: Once | INTRAMUSCULAR | Status: AC
  Administered 2022-02-26: 80 mg via EPIDURAL

## 2022-02-26 NOTE — Discharge Instructions (Signed)

## 2022-02-26 NOTE — Patient Instructions (Addendum)
Description   Continue taking warfarin 1 tablet daily except 1.5 tablets on Fridays. Recheck INR in 1 week. Call with any questions or new medications #[phone] or [phone]

## 2022-03-04 ENCOUNTER — Ambulatory Visit: Payer: Medicare Other | Attending: Cardiovascular Disease | Admitting: *Deleted

## 2022-03-04 DIAGNOSIS — I712 Thoracic aortic aneurysm, without rupture, unspecified: Secondary | ICD-10-CM | POA: Diagnosis not present

## 2022-03-04 DIAGNOSIS — Z5181 Encounter for therapeutic drug level monitoring: Secondary | ICD-10-CM | POA: Insufficient documentation

## 2022-03-04 DIAGNOSIS — I359 Nonrheumatic aortic valve disorder, unspecified: Secondary | ICD-10-CM | POA: Insufficient documentation

## 2022-03-04 LAB — POCT INR: INR: 1.9 — AB (ref 2.0–3.0)

## 2022-03-04 NOTE — Patient Instructions (Signed)
Description   Today take 1.5 tablets then continue taking warfarin 1 tablet daily except 1.5 tablets on Fridays. Recheck INR in 1 week. Call with any questions or new medications #[phone] or [phone]

## 2022-03-10 DIAGNOSIS — H26493 Other secondary cataract, bilateral: Secondary | ICD-10-CM | POA: Diagnosis not present

## 2022-03-18 ENCOUNTER — Ambulatory Visit: Payer: Medicare Other | Attending: Cardiology | Admitting: *Deleted

## 2022-03-18 DIAGNOSIS — I712 Thoracic aortic aneurysm, without rupture, unspecified: Secondary | ICD-10-CM | POA: Diagnosis not present

## 2022-03-18 DIAGNOSIS — I359 Nonrheumatic aortic valve disorder, unspecified: Secondary | ICD-10-CM | POA: Diagnosis not present

## 2022-03-18 DIAGNOSIS — Z5181 Encounter for therapeutic drug level monitoring: Secondary | ICD-10-CM | POA: Diagnosis not present

## 2022-03-18 LAB — POCT INR: INR: 2.5 (ref 2.0–3.0)

## 2022-03-18 NOTE — Patient Instructions (Signed)
Description   Continue taking warfarin 1 tablet daily except 1.5 tablets on Fridays. Recheck INR in 4 weeks. Call with any questions or new medications #[phone] or [phone]
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
## 2022-03-27 ENCOUNTER — Ambulatory Visit (INDEPENDENT_AMBULATORY_CARE_PROVIDER_SITE_OTHER): Payer: Medicare Other | Admitting: Family Medicine

## 2022-03-27 VITALS — BP 132/84 | HR 75 | Ht 72.0 in | Wt 176.0 lb

## 2022-03-27 DIAGNOSIS — M5416 Radiculopathy, lumbar region: Secondary | ICD-10-CM

## 2022-03-27 NOTE — Patient Instructions (Addendum)
[facility] [phone] Call Today   See you 4-5 weeks after the epidural

## 2022-03-27 NOTE — Assessment & Plan Note (Signed)
Patient has responded extremely well to the epidural L4-L5 on the left side but do think we will continue another 1 on the right side I think that it could help significantly with the discomfort he is having.  We discussed with patient about icing regimen and home exercises.  We discussed which activities to do and which ones to avoid.  Increase activity slowly.  Patient will follow-up again in 6 to 8 weeks after the injection.  Happy that patient is finally having good results with some of his back pain.

## 2022-04-04 ENCOUNTER — Telehealth: Payer: Self-pay | Admitting: *Deleted

## 2022-04-04 NOTE — Telephone Encounter (Signed)
   Pre-operative Risk Assessment    Patient Name: Patrick [name]  DOB: November 08, 1940 MRN: [id number]      Request for Surgical Clearance    Procedure:   LUMBAR EPIDURAL INJECTION  Date of Surgery:  Clearance TBD                                 Surgeon:  NOT LISTED Surgeon's Group or Practice Name:  [facility] Phone number:  [phone]; ATTN: CATHY C. Fax number:  [phone]   Type of Clearance Requested:   - Pharmacy:  Hold Warfarin (Coumadin) x 5 DAYS PRIOR ; CLEARANCE REQUEST THAT THE PT IS GOING TO NEED AN INR THE AFTERNOON OR MORNING OF PROCEDURE: (THIS WAS TYPED IN AS IT WAS WRITTEN IN THE REQUEST) 5. What type of anesthesia will be used?  Press F2 and select the anesthesia to be used for the procedure.  :1}  Type of Anesthesia:  Local    Additional requests/questions:    Jiles Prows   04/04/2022, 5:01 PM

## 2022-04-07 NOTE — Telephone Encounter (Signed)
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    Procedure: LUMBAR EPIDURAL INJECTION  Date of procedure: TBD     CrCl 56 mL/min Platelet count 49 K  Per office protocol, patient can hold warfarin for 5 days prior to procedure. Patient will NOT need bridging with Lovenox around procedure. He will need INR checked the day before or morning of procedure though per requesting office once procedure date has been scheduled - will forward to Coumadin clinic to help coordinate once procedure date is set.    **This guidance is not considered finalized until pre-operative APP has relayed final recommendations.**

## 2022-04-07 NOTE — Telephone Encounter (Signed)
   Patient Name: Patrick [name]  DOB: 09-14-1940 MRN: [id number]  Primary Cardiologist: Lauree Chandler, MD  Clinical pharmacists have reviewed the patient's past medical history, labs, and current medications as part of preoperative protocol coverage. The following recommendations have been made:   Per office protocol, patient can hold warfarin for 5 days prior to procedure. Patient will NOT need bridging with Lovenox around procedure. He will need INR checked the day before or morning of procedure though per requesting office once procedure date has been scheduled - will forward to Coumadin clinic to help coordinate once procedure date is set.     I will route this recommendation to the requesting party via Epic fax function and remove from pre-op pool.  Please call with questions.  Mable Fill, Marissa Nestle, NP 04/07/2022, 10:07 AM

## 2022-04-07 NOTE — Telephone Encounter (Signed)
Called pt to determine if procedure has been scheduled and make him aware that Warfarin will need to be held 5 days prior to procedure and INR will need to be checked day prior or day of procedure. No answer, left message on voicemail with call back number and also placed note on upcoming coumadin clinic appt scheduled for 04/17/22.

## 2022-04-07 NOTE — Telephone Encounter (Signed)
Pharmacy please advise on holding warfarin prior to lumbar ESI scheduled for TBD. Thank you.

## 2022-04-10 ENCOUNTER — Encounter: Payer: Self-pay | Admitting: Internal Medicine

## 2022-04-11 DIAGNOSIS — H43811 Vitreous degeneration, right eye: Secondary | ICD-10-CM | POA: Diagnosis not present

## 2022-04-16 ENCOUNTER — Ambulatory Visit: Payer: Medicare Other | Attending: Cardiology

## 2022-04-16 ENCOUNTER — Ambulatory Visit
Admission: RE | Admit: 2022-04-16 | Discharge: 2022-04-16 | Disposition: A | Discharge status: Home / Self Care | Payer: Medicare Other | Source: Ambulatory Visit | Attending: Family Medicine | Admitting: Family Medicine

## 2022-04-16 DIAGNOSIS — Z5181 Encounter for therapeutic drug level monitoring: Secondary | ICD-10-CM | POA: Diagnosis not present

## 2022-04-16 DIAGNOSIS — I359 Nonrheumatic aortic valve disorder, unspecified: Secondary | ICD-10-CM | POA: Diagnosis not present

## 2022-04-16 DIAGNOSIS — I712 Thoracic aortic aneurysm, without rupture, unspecified: Secondary | ICD-10-CM | POA: Insufficient documentation

## 2022-04-16 DIAGNOSIS — M5416 Radiculopathy, lumbar region: Secondary | ICD-10-CM

## 2022-04-16 LAB — POCT INR: INR: 1 — AB (ref 2.0–3.0)

## 2022-04-16 MED ORDER — METHYLPREDNISOLONE ACETATE 40 MG/ML INJ SUSP (RADIOLOG
80.0000 mg | Freq: Once | INTRAMUSCULAR | Status: AC
  Administered 2022-04-16: 80 mg via EPIDURAL

## 2022-04-16 MED ORDER — IOPAMIDOL (ISOVUE-M 200) INJECTION 41%
1.0000 mL | Freq: Once | INTRAMUSCULAR | Status: AC
  Administered 2022-04-16: 1 mL via EPIDURAL

## 2022-04-16 NOTE — Discharge Instructions (Signed)
Post Procedure Spinal Discharge Instruction Sheet  You may resume a regular diet and any medications that you routinely take (including pain medications) unless otherwise noted by MD.  No driving day of procedure.  Light activity throughout the rest of the day.  Do not do any strenuous work, exercise, bending or lifting.  The day following the procedure, you can resume normal physical activity but you should refrain from exercising or physical therapy for at least three days thereafter.  You may apply ice to the injection site, 20 minutes on, 20 minutes off, as needed. Do not apply ice directly to skin.    Common Side Effects:  Headaches- take your usual medications as directed by your physician.  Increase your fluid intake.  Caffeinated beverages may be helpful.  Lie flat in bed until your headache resolves.  Restlessness or inability to sleep- you may have trouble sleeping for the next few days.  Ask your referring physician if you need any medication for sleep.  Facial flushing or redness- should subside within a few days.  Increased pain- a temporary increase in pain a day or two following your procedure is not unusual.  Take your pain medication as prescribed by your referring physician.  Leg cramps  Please contact our office at [phone] for the following symptoms: Fever greater than 100 degrees. Headaches unresolved with medication after 2-3 days. Increased swelling, pain, or redness at injection site.   Thank you for visiting [facility] today.   YOU MAY RESUME YOUR COUMADIN 12 HOURS AFTER PROCEDURE

## 2022-04-16 NOTE — Patient Instructions (Signed)
Description   Post procedure, restart Warfarin (or as directed by MD) taking warfarin 1 tablet daily except 1.5 tablets on Fridays.  Recheck INR in 1 week.  Call with any questions or new medications #[phone] or [phone]

## 2022-04-17 ENCOUNTER — Encounter: Payer: Self-pay | Admitting: Family Medicine

## 2022-04-21 ENCOUNTER — Ambulatory Visit: Payer: Medicare Other | Attending: Cardiology | Admitting: *Deleted

## 2022-04-21 DIAGNOSIS — Z5181 Encounter for therapeutic drug level monitoring: Secondary | ICD-10-CM | POA: Diagnosis not present

## 2022-04-21 DIAGNOSIS — I359 Nonrheumatic aortic valve disorder, unspecified: Secondary | ICD-10-CM

## 2022-04-21 LAB — POCT INR: POC INR: 1.5

## 2022-04-21 NOTE — Patient Instructions (Addendum)
Description   Take 1.5  tablets of warfarin today and tomorrow then continue taking warfarin 1 tablet daily except 1.5 tablets on Fridays.  Recheck INR in 1 week.  Call with any questions or new medications #[phone] or [phone]

## 2022-04-28 ENCOUNTER — Ambulatory Visit: Payer: Medicare Other

## 2022-05-06 ENCOUNTER — Ambulatory Visit: Payer: Medicare Other | Attending: Cardiovascular Disease | Admitting: *Deleted

## 2022-05-06 ENCOUNTER — Inpatient Hospital Stay: Payer: Medicare Other | Admitting: Medical Oncology

## 2022-05-06 ENCOUNTER — Inpatient Hospital Stay: Payer: Medicare Other

## 2022-05-06 DIAGNOSIS — Z5181 Encounter for therapeutic drug level monitoring: Secondary | ICD-10-CM

## 2022-05-06 DIAGNOSIS — I359 Nonrheumatic aortic valve disorder, unspecified: Secondary | ICD-10-CM | POA: Insufficient documentation

## 2022-05-06 LAB — POCT INR: INR: 2.2 (ref 2.0–3.0)

## 2022-05-06 NOTE — Patient Instructions (Signed)
Description   Continue taking warfarin 1 tablet daily except 1.5 tablets on Fridays.  Recheck INR in 3 weeks. Call with any questions or new medications #[phone] or [phone]

## 2022-05-15 ENCOUNTER — Other Ambulatory Visit: Payer: Self-pay | Admitting: Internal Medicine

## 2022-05-16 ENCOUNTER — Other Ambulatory Visit: Payer: Self-pay | Admitting: Internal Medicine

## 2022-05-17 ENCOUNTER — Other Ambulatory Visit: Payer: Self-pay | Admitting: Internal Medicine
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
## 2022-05-26 ENCOUNTER — Inpatient Hospital Stay (HOSPITAL_BASED_OUTPATIENT_CLINIC_OR_DEPARTMENT_OTHER): Payer: Medicare Other | Admitting: Hematology & Oncology

## 2022-05-26 ENCOUNTER — Inpatient Hospital Stay: Payer: Medicare Other | Attending: Hematology & Oncology

## 2022-05-26 ENCOUNTER — Encounter: Payer: Self-pay | Admitting: Hematology & Oncology

## 2022-05-26 ENCOUNTER — Other Ambulatory Visit: Payer: Self-pay

## 2022-05-26 VITALS — BP 127/74 | HR 59 | Temp 97.7°F | Resp 18 | Ht 72.0 in | Wt 173.0 lb

## 2022-05-26 DIAGNOSIS — D696 Thrombocytopenia, unspecified: Secondary | ICD-10-CM | POA: Insufficient documentation

## 2022-05-26 DIAGNOSIS — D72819 Decreased white blood cell count, unspecified: Secondary | ICD-10-CM | POA: Insufficient documentation

## 2022-05-26 DIAGNOSIS — D709 Neutropenia, unspecified: Secondary | ICD-10-CM

## 2022-05-26 LAB — CMP (CANCER CENTER ONLY)
ALT: 16 U/L (ref 0–44)
AST: 17 U/L (ref 15–41)
Albumin: 4.4 g/dL (ref 3.5–5.0)
Alkaline Phosphatase: 68 U/L (ref 38–126)
Anion gap: 6 (ref 5–15)
BUN: 18 mg/dL (ref 8–23)
CO2: 29 mmol/L (ref 22–32)
Calcium: 9.6 mg/dL (ref 8.9–10.3)
Chloride: 105 mmol/L (ref 98–111)
Creatinine: 1.24 mg/dL (ref 0.61–1.24)
GFR, Estimated: 59 mL/min — ABNORMAL LOW
Glucose, Bld: 147 mg/dL — ABNORMAL HIGH (ref 70–99)
Potassium: 4.3 mmol/L (ref 3.5–5.1)
Sodium: 140 mmol/L (ref 135–145)
Total Bilirubin: 0.7 mg/dL (ref 0.3–1.2)
Total Protein: 5.9 g/dL — ABNORMAL LOW (ref 6.5–8.1)

## 2022-05-26 LAB — CBC WITH DIFFERENTIAL (CANCER CENTER ONLY)
Abs Immature Granulocytes: 0.01 K/uL (ref 0.00–0.07)
Basophils Absolute: 0 K/uL (ref 0.0–0.1)
Basophils Relative: 0 %
Eosinophils Absolute: 0 K/uL (ref 0.0–0.5)
Eosinophils Relative: 1 %
HCT: 35.9 % — ABNORMAL LOW (ref 39.0–52.0)
Hemoglobin: 12.2 g/dL — ABNORMAL LOW (ref 13.0–17.0)
Immature Granulocytes: 1 %
Lymphocytes Relative: 17 %
Lymphs Abs: 0.3 K/uL — ABNORMAL LOW (ref 0.7–4.0)
MCH: 34.2 pg — ABNORMAL HIGH (ref 26.0–34.0)
MCHC: 34 g/dL (ref 30.0–36.0)
MCV: 100.6 fL — ABNORMAL HIGH (ref 80.0–100.0)
Monocytes Absolute: 0.2 K/uL (ref 0.1–1.0)
Monocytes Relative: 8 %
Neutro Abs: 1.5 K/uL — ABNORMAL LOW (ref 1.7–7.7)
Neutrophils Relative %: 73 %
Platelet Count: 46 K/uL — ABNORMAL LOW (ref 150–400)
RBC: 3.57 MIL/uL — ABNORMAL LOW (ref 4.22–5.81)
RDW: 16 % — ABNORMAL HIGH (ref 11.5–15.5)
WBC Count: 2 K/uL — ABNORMAL LOW (ref 4.0–10.5)
nRBC: 0 % (ref 0.0–0.2)

## 2022-05-26 LAB — RETICULOCYTES
Immature Retic Fract: 11.6 % (ref 2.3–15.9)
RBC.: 3.51 MIL/uL — ABNORMAL LOW (ref 4.22–5.81)
Retic Count, Absolute: 61.4 K/uL (ref 19.0–186.0)
Retic Ct Pct: 1.8 % (ref 0.4–3.1)

## 2022-05-26 LAB — IRON AND IRON BINDING CAPACITY (CC-WL,HP ONLY)
Iron: 135 ug/dL (ref 45–182)
Saturation Ratios: 50 % — ABNORMAL HIGH (ref 17.9–39.5)
TIBC: 269 ug/dL (ref 250–450)
UIBC: 134 ug/dL (ref 117–376)

## 2022-05-26 LAB — FERRITIN: Ferritin: 431 ng/mL — ABNORMAL HIGH (ref 24–336)

## 2022-05-26 LAB — SAVE SMEAR(SSMR), FOR PROVIDER SLIDE REVIEW

## 2022-05-26 NOTE — Progress Notes (Signed)
Hematology and Oncology Follow Up Visit  Patrick [name] [id number] [birth year] 82 y.o. 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  Unfortunately, his wife does not seem to be doing all that well.  She has some neurological issue going on.  Hopefully, they will be able to figure out what is going on.  I last saw him back in November.  Since then, he has been traveling, as always, he will be going to [place] in May and then down to [place].  He is getting ready for the Market this month.  He has had no problem with infections.  I think he may have had an eye infection but outside of that no systemic infections with pneumonia.  He has had no change in bowel or bladder habits.  He has had no nausea or vomiting.  There is been no bleeding.  He is on Coumadin.  He has does have a heart valve.  There is been no leg swelling.  There has been no ecchymoses.  Overall, his performance status is ECOG 1.  Medications:  Current Outpatient Medications:    amLODipine (NORVASC) 5 MG tablet, TAKE ONE TABLET BY MOUTH ONE TIME DAILY, Disp: 90 tablet, Rfl: 2   atorvastatin (LIPITOR) 10 MG tablet, TAKE ONE TABLET BY MOUTH ONE TIME DAILY, Disp: 90 tablet, Rfl: 0   pantoprazole (PROTONIX) 40 MG tablet, Take 1 tablet (40 mg total) by mouth daily. OVERDUE for Annual appt must see provider for future refills, Disp: 30 tablet, Rfl: 0   SUMAtriptan (IMITREX) 25 MG tablet, TAKE ONE TABLET BY MOUTH AT ONSET OF HEADACHE. MAY REPEAT DOSE WITH ONE TABLET IN TWO HOURS IF NEEDED. DO NOT EXCEED TWO TABLETS IN 24 HOURS., Disp: 12 tablet, Rfl: 0   tadalafil (CIALIS) 5 MG tablet, Take 1 tablet (5 mg total) by mouth daily., Disp: 90 tablet, Rfl: 3   warfarin (JANTOVEN) 7.5 MG tablet, TAKE ONE TO ONE AND A HALF TABLETS BY MOUTH DAILY OR AS DIRECTED BY THE ANTICOAGULATION CLINIC, Disp: 120  tablet, Rfl: 1  Allergies:  Allergies  Allergen Reactions   Codeine Rash   Terbinafine Itching    Past Medical History, Surgical history, Social history, and Family History were reviewed and updated.  Review of Systems: Review of Systems  Constitutional: Negative.   HENT:  Negative.    Eyes: Negative.   Respiratory: Negative.    Cardiovascular: Negative.   Gastrointestinal: Negative.   Endocrine: Negative.   Genitourinary: Negative.    Musculoskeletal: Negative.   Skin: Negative.   Neurological: Negative.   Hematological: Negative.   Psychiatric/Behavioral: Negative.      Physical Exam:  height is 6' (1.829 m) and weight is 173 lb (78.5 kg). His oral temperature is 97.7 F (36.5 C). His blood pressure is 127/74 and his pulse is 59 (abnormal). His respiration is 18 and oxygen saturation is 99%.   Wt Readings from Last 3 Encounters:  05/26/22 173 lb (78.5 kg)  03/27/22 176 lb (79.8 kg)  01/09/22 174 lb 1.3 oz (79 kg)    Physical Exam Vitals reviewed.  HENT:     Head: Normocephalic and atraumatic.  Eyes:     Pupils: Pupils are equal, round, and reactive to light.  Cardiovascular:     Rate and Rhythm: Normal rate and regular rhythm.     Heart sounds:  Normal heart sounds.     Comments: Cardiac exam shows a regular rate and rhythm.  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  I hear no murmurs or rubs or bruits. Pulmonary:     Effort: Pulmonary effort is normal.     Breath sounds: Normal breath sounds.  Abdominal:     General: Bowel sounds are normal.     Palpations: Abdomen is soft.  Musculoskeletal:        General: No tenderness or deformity. Normal range of motion.     Cervical back: Normal range of motion.  Lymphadenopathy:     Cervical: No cervical adenopathy.  Skin:    General: Skin is warm and dry.     Findings: No erythema or rash.  Neurological:     Mental Status: He is alert and oriented to person, place, and time.   Psychiatric:        Behavior: Behavior normal.        Thought Content: Thought content normal.        Judgment: Judgment normal.      Lab Results  Component Value Date   WBC 2.0 (L) 05/26/2022   HGB 12.2 (L) 05/26/2022   HCT 35.9 (L) 05/26/2022   MCV 100.6 (H) 05/26/2022   PLT 46 (L) 05/26/2022     Chemistry      Component Value Date/Time   NA 140 05/26/2022 1041   NA 139 11/12/2020 1030   K 4.3 05/26/2022 1041   CL 105 05/26/2022 1041   CO2 29 05/26/2022 1041   BUN 18 05/26/2022 1041   BUN 16 11/12/2020 1030   CREATININE 1.24 05/26/2022 1041      Component Value Date/Time   CALCIUM 9.6 05/26/2022 1041   ALKPHOS 68 05/26/2022 1041   AST 17 05/26/2022 1041   ALT 16 05/26/2022 1041   BILITOT 0.7 05/26/2022 1041       Impression and Plan: Patrick [name] is a 82 year old white male.  He has mild leukopenia and thrombocytopenia.  He does have a couple abnormalities on NGS testing.  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  At this point, I suspect we probably will have to do a bone marrow biopsy on him.  I want to try to hold off as long as I can.  I know that his wife is certainly a priority for him.  I would like to see him back after [facility].  They will be 2 months.  We will see how his blood counts look at that time.  Ideally, it would be nice to try to get him through the Summer if possible without having to intervene.   Volanda Napoleon, MD 4/1/202411:22 AM

## 2022-05-27 ENCOUNTER — Ambulatory Visit: Payer: Medicare Other

## 2022-05-29 ENCOUNTER — Ambulatory Visit: Payer: Medicare Other | Attending: Cardiology | Admitting: *Deleted

## 2022-05-29 DIAGNOSIS — Z5181 Encounter for therapeutic drug level monitoring: Secondary | ICD-10-CM | POA: Diagnosis not present

## 2022-05-29 DIAGNOSIS — I359 Nonrheumatic aortic valve disorder, unspecified: Secondary | ICD-10-CM | POA: Insufficient documentation

## 2022-05-29 LAB — POCT INR: INR: 2.7 (ref 2.0–3.0)

## 2022-05-29 NOTE — Patient Instructions (Signed)
Description   Continue taking warfarin 1 tablet daily except 1.5 tablets on Fridays. Recheck INR in 4 weeks. Call with any questions or new medications #[phone] or [phone]      

## 2022-05-30 ENCOUNTER — Ambulatory Visit (INDEPENDENT_AMBULATORY_CARE_PROVIDER_SITE_OTHER): Payer: Medicare Other | Admitting: Family Medicine

## 2022-05-30 VITALS — BP 130/74 | HR 68 | Ht 72.0 in | Wt 176.0 lb

## 2022-05-30 DIAGNOSIS — M5416 Radiculopathy, lumbar region: Secondary | ICD-10-CM | POA: Diagnosis not present

## 2022-05-30 DIAGNOSIS — M21371 Foot drop, right foot: Secondary | ICD-10-CM | POA: Diagnosis not present

## 2022-05-30 NOTE — Assessment & Plan Note (Signed)
Significant improvement with the pain in the back after the epidural.  Feels like he is doing much better.  Does have intermittent pain though still noted of the right lower extremity.  Patient is on Coumadin and I do not see any swelling of the lower extremity but we did discuss the possibility of Doppler.  With the intermittent findings though I do think that this is low likelihood.  Patient declined the test.  We discussed that with him making improvement we can repeat the epidurals.  Patient will be traveling a great deal over the course the next month so would like to hold on that as well.  Will follow-up with me again as needed otherwise.

## 2022-05-30 NOTE — Assessment & Plan Note (Signed)
Continued foot drop, will continue to monitor.  We did discuss a potential brace but patient would like to avoid that if possible.

## 2022-06-05 ENCOUNTER — Other Ambulatory Visit: Payer: Self-pay | Admitting: Internal Medicine

## 2022-06-14 ENCOUNTER — Other Ambulatory Visit: Payer: Self-pay | Admitting: Internal Medicine

## 2022-06-16 ENCOUNTER — Encounter: Payer: Self-pay | Admitting: Internal Medicine

## 2022-06-16 MED ORDER — PANTOPRAZOLE SODIUM 40 MG PO TBEC: 40.0000 mg | DELAYED_RELEASE_TABLET | Freq: Every day | ORAL | 0 refills | Status: DC

## 2022-06-16 NOTE — Addendum Note (Signed)
Addended by: Deatra James on: 06/16/2022 04:18 PM   Modules accepted: Orders

## 2022-06-24 ENCOUNTER — Encounter: Payer: Self-pay | Admitting: Internal Medicine

## 2022-06-24 ENCOUNTER — Ambulatory Visit (INDEPENDENT_AMBULATORY_CARE_PROVIDER_SITE_OTHER): Payer: Medicare Other | Admitting: Internal Medicine

## 2022-06-24 VITALS — BP 102/60 | HR 62 | Temp 98.6°F | Ht 72.0 in | Wt 173.0 lb

## 2022-06-24 DIAGNOSIS — L812 Freckles: Secondary | ICD-10-CM | POA: Diagnosis not present

## 2022-06-24 DIAGNOSIS — G8929 Other chronic pain: Secondary | ICD-10-CM

## 2022-06-24 DIAGNOSIS — E559 Vitamin D deficiency, unspecified: Secondary | ICD-10-CM | POA: Diagnosis not present

## 2022-06-24 DIAGNOSIS — L820 Inflamed seborrheic keratosis: Secondary | ICD-10-CM | POA: Diagnosis not present

## 2022-06-24 DIAGNOSIS — L570 Actinic keratosis: Secondary | ICD-10-CM | POA: Diagnosis not present

## 2022-06-24 DIAGNOSIS — E785 Hyperlipidemia, unspecified: Secondary | ICD-10-CM | POA: Diagnosis not present

## 2022-06-24 DIAGNOSIS — Z85828 Personal history of other malignant neoplasm of skin: Secondary | ICD-10-CM | POA: Diagnosis not present

## 2022-06-24 DIAGNOSIS — M545 Low back pain, unspecified: Secondary | ICD-10-CM | POA: Diagnosis not present

## 2022-06-24 DIAGNOSIS — R5383 Other fatigue: Secondary | ICD-10-CM

## 2022-06-24 DIAGNOSIS — D696 Thrombocytopenia, unspecified: Secondary | ICD-10-CM | POA: Diagnosis not present

## 2022-06-24 DIAGNOSIS — R202 Paresthesia of skin: Secondary | ICD-10-CM | POA: Diagnosis not present

## 2022-06-24 DIAGNOSIS — R972 Elevated prostate specific antigen [PSA]: Secondary | ICD-10-CM

## 2022-06-24 DIAGNOSIS — L218 Other seborrheic dermatitis: Secondary | ICD-10-CM | POA: Diagnosis not present

## 2022-06-24 DIAGNOSIS — L308 Other specified dermatitis: Secondary | ICD-10-CM | POA: Diagnosis not present

## 2022-06-24 DIAGNOSIS — L821 Other seborrheic keratosis: Secondary | ICD-10-CM | POA: Diagnosis not present

## 2022-06-24 DIAGNOSIS — A071 Giardiasis [lambliasis]: Secondary | ICD-10-CM | POA: Insufficient documentation

## 2022-06-24 DIAGNOSIS — Z7901 Long term (current) use of anticoagulants: Secondary | ICD-10-CM

## 2022-06-24 MED ORDER — PANTOPRAZOLE SODIUM 40 MG PO TBEC: 40.0000 mg | DELAYED_RELEASE_TABLET | Freq: Every day | ORAL | 3 refills | Status: DC

## 2022-06-24 NOTE — Assessment & Plan Note (Signed)
S/p PAE (prostate artery embolization) 2020 F/u w/Dr Richardo Hanks

## 2022-06-24 NOTE — Assessment & Plan Note (Signed)
On Vit D 

## 2022-06-24 NOTE — Assessment & Plan Note (Signed)
S/p aortic valve replacement. On Warfarin

## 2022-06-24 NOTE — Progress Notes (Signed)
Subjective:  Patient ID: Patrick [name], male    DOB: 08-Jan-1941  Age: 82 y.o. 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 MAY REPEAT DOSE WITH ONE TABLET IN TWO HOURS IF NEEDED. DO NOT EXCEED TWO TABLETS IN 24 HOURS. 12 tablet 0   tadalafil (CIALIS) 5 MG tablet Take 1 tablet (5 mg total) by mouth daily. 90 tablet 3   warfarin (JANTOVEN) 7.5 MG tablet TAKE ONE TO ONE AND A HALF TABLETS BY MOUTH DAILY OR AS DIRECTED BY THE ANTICOAGULATION CLINIC 120 tablet 1   pantoprazole (PROTONIX) 40 MG tablet Take 1 tablet (40 mg total) by mouth daily. Must keep 06/24/22 appt for future refills 30 tablet 0   No facility-administered medications prior to visit.    ROS: Review of Systems  Constitutional:  Negative for appetite change, fatigue and unexpected weight change.  HENT:  Negative for congestion, nosebleeds, sneezing, sore throat and trouble swallowing.   Eyes:  Negative for itching and visual disturbance.  Respiratory:  Negative for cough.   Cardiovascular:  Negative for chest pain, palpitations and leg swelling.  Gastrointestinal:  Negative for abdominal distention, blood in stool, diarrhea and nausea.  Genitourinary:  Negative for frequency and hematuria.  Musculoskeletal:  Positive for arthralgias. Negative for back pain, gait problem, joint swelling and neck pain.  Skin:  Negative for rash.  Neurological:  Negative for dizziness, tremors, speech difficulty and weakness.  Psychiatric/Behavioral:  Negative for agitation, dysphoric mood and sleep  disturbance. The patient is not nervous/anxious.     Objective:  BP 102/60 (BP Location: Left Arm, Patient Position: Sitting, Cuff Size: Normal)   Pulse 62   Temp 98.6 F (37 C) (Oral)   Ht 6' (1.829 m)   Wt 173 lb (78.5 kg)   SpO2 96%   BMI 23.46 kg/m   BP Readings from Last 3 Encounters:  06/24/22 102/60  05/30/22 130/74  05/26/22 127/74    Wt Readings from Last 3 Encounters:  06/24/22 173 lb (78.5 kg)  05/30/22 176 lb (79.8 kg)  05/26/22 173 lb (78.5 kg)    Physical Exam Constitutional:      General: He is not in acute distress.    Appearance: He is well-developed.     Comments: NAD  Eyes:     Conjunctiva/sclera: Conjunctivae normal.     Pupils: Pupils are equal, round, and reactive to light.  Neck:     Thyroid: No thyromegaly.     Vascular: No JVD.  Cardiovascular:     Rate and Rhythm: Normal rate and regular rhythm.     Heart sounds: Normal heart sounds. No murmur heard.    No friction rub. No gallop.  Pulmonary:     Effort: Pulmonary effort is normal. No respiratory distress.     Breath sounds: Normal breath sounds. No wheezing or rales.  Chest:     Chest wall: No tenderness.  Abdominal:     General: Bowel sounds are normal. There is no distension.     Palpations: Abdomen is  soft. There is no mass.     Tenderness: There is no abdominal tenderness. There is no guarding or rebound.  Musculoskeletal:        General: No tenderness. Normal range of motion.     Cervical back: Normal range of motion.  Lymphadenopathy:     Cervical: No cervical adenopathy.  Skin:    General: Skin is warm and dry.     Findings: No rash.  Neurological:     Mental Status: He is alert and oriented to person, place, and time.     Cranial Nerves: No cranial nerve deficit.     Motor: No abnormal muscle tone.     Coordination: Coordination normal.     Gait: Gait normal.     Deep Tendon Reflexes: Reflexes are normal and symmetric.  Psychiatric:        Behavior: Behavior normal.         Thought Content: Thought content normal.        Judgment: Judgment normal.     Lab Results  Component Value Date   WBC 2.0 (L) 05/26/2022   HGB 12.2 (L) 05/26/2022   HCT 35.9 (L) 05/26/2022   PLT 46 (L) 05/26/2022   GLUCOSE 147 (H) 05/26/2022   CHOL 129 06/28/2019   TRIG 96.0 06/28/2019   HDL 43.90 06/28/2019   LDLDIRECT 67.0 12/22/2017   LDLCALC 66 06/28/2019   ALT 16 05/26/2022   AST 17 05/26/2022   NA 140 05/26/2022   K 4.3 05/26/2022   CL 105 05/26/2022   CREATININE 1.24 05/26/2022   BUN 18 05/26/2022   CO2 29 05/26/2022   TSH 0.97 06/28/2019   PSA 1.66 06/28/2019   INR 2.7 05/29/2022   HGBA1C 5.3 08/20/2016    DG INJECT DIAG/THERA/INC NEEDLE/CATH/PLC EPI/LUMB/SAC W/IMG  Result Date: 04/16/2022 CLINICAL DATA:  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 Left-sided pain is better after left L4-L5 epidural injection 7 weeks ago. Repeat injection on the right requested today. FLUOROSCOPY: Radiation Exposure Index (as provided by the fluoroscopic device): 2.8 mGy Kerma PROCEDURE: The procedure, risks, benefits, and alternatives were explained to the patient. Questions regarding the procedure were encouraged and answered. The patient understands and consents to the procedure. LUMBAR EPIDURAL INJECTION: An interlaminar approach was performed on the right at L4-L5. The overlying skin was cleansed and anesthetized. A 3.5 inch 20 gauge epidural needle was advanced using loss-of-resistance technique. DIAGNOSTIC EPIDURAL INJECTION: Injection of Isovue-M 200 shows a good epidural pattern with spread above and below the level of needle placement, primarily on the right. No vascular opacification is seen. THERAPEUTIC EPIDURAL INJECTION: 80 mg of Depo-Medrol mixed with 3 mL of 1% lidocaine were instilled. The procedure was well-tolerated, and the patient was discharged thirty minutes following the injection in good condition. COMPLICATIONS: None  immediate. IMPRESSION: Technically successful interlaminar epidural injection on the right at L4-L5. Electronically Signed   By: Obie Dredge M.D.   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      Elevated PSA    S/p PAE (prostate artery embolization)  2020 F/u w/Dr Richardo Hanks      Relevant Orders   Urinalysis   Testosterone   Thrombocytopenia (HCC) (Chronic)    ?MDS  - f/u w/Dr Myna Hidalgo      Paresthesia   Relevant Orders   CBC with Differential/Platelet   CK   Vitamin B12   Vitamin D deficiency    On Vit D      Relevant Orders   Lipid panel   VITAMIN D 25 Hydroxy (Vit-D Deficiency, Fractures)   Other Visit Diagnoses     Other fatigue       Relevant Orders   TSH   CBC with Differential/Platelet   Comprehensive metabolic panel         Meds ordered this encounter  Medications   pantoprazole (PROTONIX) 40 MG tablet    Sig: Take 1 tablet (40 mg total) by mouth daily.    Dispense:  90 tablet    Refill:  3      Follow-up: No follow-ups on file.  Sonda Primes, MD

## 2022-06-24 NOTE — Assessment & Plan Note (Signed)
?  MDS  - f/u w/Dr Myna Hidalgo

## 2022-06-25 ENCOUNTER — Telehealth: Payer: Self-pay | Admitting: *Deleted

## 2022-06-25 LAB — VITAMIN B12: Vitamin B-12: 527 pg/mL (ref 211–911)

## 2022-06-25 LAB — URINALYSIS
Bilirubin Urine: NEGATIVE
Hgb urine dipstick: NEGATIVE
Ketones, ur: NEGATIVE
Leukocytes,Ua: NEGATIVE
Nitrite: NEGATIVE
Specific Gravity, Urine: 1.02 (ref 1.000–1.030)
Total Protein, Urine: NEGATIVE
Urine Glucose: NEGATIVE
Urobilinogen, UA: 0.2 (ref 0.0–1.0)
pH: 6.5 (ref 5.0–8.0)

## 2022-06-25 LAB — COMPREHENSIVE METABOLIC PANEL
ALT: 26 U/L (ref 0–53)
AST: 24 U/L (ref 0–37)
Albumin: 4.1 g/dL (ref 3.5–5.2)
Alkaline Phosphatase: 77 U/L (ref 39–117)
BUN: 15 mg/dL (ref 6–23)
CO2: 29 mEq/L (ref 19–32)
Calcium: 9.2 mg/dL (ref 8.4–10.5)
Chloride: 104 mEq/L (ref 96–112)
Creatinine, Ser: 1.08 mg/dL (ref 0.40–1.50)
GFR: 65.07 mL/min (ref >60.00)
Glucose, Bld: 95 mg/dL (ref 70–99)
Potassium: 4.8 mEq/L (ref 3.5–5.1)
Sodium: 137 mEq/L (ref 135–145)
Total Bilirubin: 0.6 mg/dL (ref 0.2–1.2)
Total Protein: 6.2 g/dL (ref 6.0–8.3)

## 2022-06-25 LAB — TESTOSTERONE: Testosterone: 293.35 ng/dL — ABNORMAL LOW (ref 300.00–890.00)

## 2022-06-25 LAB — CBC WITH DIFFERENTIAL/PLATELET
Basophils Absolute: 0 10*3/uL (ref 0.0–0.1)
Basophils Relative: 0.5 % (ref 0.0–3.0)
Eosinophils Absolute: 0 10*3/uL (ref 0.0–0.7)
Eosinophils Relative: 1 % (ref 0.0–5.0)
HCT: 33.9 % — ABNORMAL LOW (ref 39.0–52.0)
Hemoglobin: 11.9 g/dL — ABNORMAL LOW (ref 13.0–17.0)
Lymphocytes Relative: 15.3 % (ref 12.0–46.0)
Lymphs Abs: 0.3 10*3/uL — ABNORMAL LOW (ref 0.7–4.0)
MCHC: 35 g/dL (ref 30.0–36.0)
MCV: 101.4 fl — ABNORMAL HIGH (ref 78.0–100.0)
Monocytes Absolute: 0.2 10*3/uL (ref 0.1–1.0)
Monocytes Relative: 8.1 % (ref 3.0–12.0)
Neutro Abs: 1.6 10*3/uL (ref 1.4–7.7)
Neutrophils Relative %: 75.1 % (ref 43.0–77.0)
Platelets: 48 10*3/uL — CL (ref 150.0–400.0)
RBC: 3.34 Mil/uL — ABNORMAL LOW (ref 4.22–5.81)
RDW: 17.6 % — ABNORMAL HIGH (ref 11.5–15.5)
WBC: 2 10*3/uL — ABNORMAL LOW (ref 4.0–10.5)

## 2022-06-25 LAB — LIPID PANEL
Cholesterol: 112 mg/dL (ref 0–200)
HDL: 35.5 mg/dL — ABNORMAL LOW (ref >39.00)
LDL Cholesterol: 54 mg/dL (ref 0–99)
NonHDL: 76.3
Total CHOL/HDL Ratio: 3
Triglycerides: 110 mg/dL (ref 0.0–149.0)
VLDL: 22 mg/dL (ref 0.0–40.0)

## 2022-06-25 LAB — VITAMIN D 25 HYDROXY (VIT D DEFICIENCY, FRACTURES): VITD: 51.61 ng/mL (ref 30.00–100.00)

## 2022-06-25 LAB — CK: Total CK: 53 U/L (ref 7–232)

## 2022-06-25 LAB — TSH: TSH: 0.55 u[IU]/mL (ref 0.35–5.50)

## 2022-06-25 NOTE — Telephone Encounter (Signed)
Platelet 48,0000

## 2022-06-26 ENCOUNTER — Ambulatory Visit: Payer: Medicare Other | Attending: Cardiology | Admitting: *Deleted

## 2022-06-26 DIAGNOSIS — I359 Nonrheumatic aortic valve disorder, unspecified: Secondary | ICD-10-CM | POA: Diagnosis present

## 2022-06-26 DIAGNOSIS — Z5181 Encounter for therapeutic drug level monitoring: Secondary | ICD-10-CM | POA: Insufficient documentation

## 2022-06-26 LAB — POCT INR: POC INR: 2.5

## 2022-06-26 NOTE — Patient Instructions (Signed)
Description   Continue taking warfarin 1 tablet daily except 1.5 tablets on Fridays.  Recheck INR in 5 weeks. Call with any questions or new medications #[phone] or [phone]

## 2022-06-27 NOTE — Telephone Encounter (Signed)
Noted.  This is not new.  He will follow-up with Dr. Myna Hidalgo.  Thanks

## 2022-07-11 ENCOUNTER — Other Ambulatory Visit: Payer: Self-pay | Admitting: Internal Medicine

## 2022-07-18 ENCOUNTER — Telehealth: Payer: Self-pay | Admitting: Radiology

## 2022-07-18 NOTE — Telephone Encounter (Signed)
Left voice mail for patient to call back at [phone] to schedule Medicare Annual Wellness Visit    Last AWV:  09/04/2020   Please schedule Sequential/Initial AWV with [facility]. CMA

## 2022-07-28 ENCOUNTER — Inpatient Hospital Stay (HOSPITAL_BASED_OUTPATIENT_CLINIC_OR_DEPARTMENT_OTHER): Payer: Medicare Other | Admitting: Medical Oncology

## 2022-07-28 ENCOUNTER — Inpatient Hospital Stay: Payer: Medicare Other | Attending: Hematology & Oncology

## 2022-07-28 ENCOUNTER — Encounter: Payer: Self-pay | Admitting: Medical Oncology

## 2022-07-28 ENCOUNTER — Other Ambulatory Visit: Payer: Self-pay

## 2022-07-28 VITALS — BP 126/57 | HR 61 | Temp 97.9°F | Resp 17 | Ht 78.0 in | Wt 173.0 lb

## 2022-07-28 DIAGNOSIS — D72810 Lymphocytopenia: Secondary | ICD-10-CM

## 2022-07-28 DIAGNOSIS — Z7901 Long term (current) use of anticoagulants: Secondary | ICD-10-CM | POA: Diagnosis not present

## 2022-07-28 DIAGNOSIS — D709 Neutropenia, unspecified: Secondary | ICD-10-CM | POA: Diagnosis not present

## 2022-07-28 DIAGNOSIS — D72819 Decreased white blood cell count, unspecified: Secondary | ICD-10-CM | POA: Diagnosis present

## 2022-07-28 DIAGNOSIS — D696 Thrombocytopenia, unspecified: Secondary | ICD-10-CM | POA: Insufficient documentation

## 2022-07-28 DIAGNOSIS — Z952 Presence of prosthetic heart valve: Secondary | ICD-10-CM | POA: Diagnosis not present

## 2022-07-28 LAB — CMP (CANCER CENTER ONLY)
ALT: 17 U/L (ref 0–44)
AST: 17 U/L (ref 15–41)
Albumin: 4.3 g/dL (ref 3.5–5.0)
Alkaline Phosphatase: 65 U/L (ref 38–126)
Anion gap: 6 (ref 5–15)
BUN: 15 mg/dL (ref 8–23)
CO2: 30 mmol/L (ref 22–32)
Calcium: 9.5 mg/dL (ref 8.9–10.3)
Chloride: 105 mmol/L (ref 98–111)
Creatinine: 1.22 mg/dL (ref 0.61–1.24)
GFR, Estimated: 60 mL/min — ABNORMAL LOW (ref >60)
Glucose, Bld: 172 mg/dL — ABNORMAL HIGH (ref 70–99)
Potassium: 4.4 mmol/L (ref 3.5–5.1)
Sodium: 141 mmol/L (ref 135–145)
Total Bilirubin: 0.6 mg/dL (ref 0.3–1.2)
Total Protein: 6.3 g/dL — ABNORMAL LOW (ref 6.5–8.1)

## 2022-07-28 LAB — FERRITIN: Ferritin: 391 ng/mL — ABNORMAL HIGH (ref 24–336)

## 2022-07-28 LAB — CBC WITH DIFFERENTIAL (CANCER CENTER ONLY)
Abs Immature Granulocytes: 0.03 10*3/uL (ref 0.00–0.07)
Basophils Absolute: 0 10*3/uL (ref 0.0–0.1)
Basophils Relative: 0 %
Eosinophils Absolute: 0 10*3/uL (ref 0.0–0.5)
Eosinophils Relative: 1 %
HCT: 33 % — ABNORMAL LOW (ref 39.0–52.0)
Hemoglobin: 11.2 g/dL — ABNORMAL LOW (ref 13.0–17.0)
Immature Granulocytes: 2 %
Lymphocytes Relative: 18 %
Lymphs Abs: 0.3 10*3/uL — ABNORMAL LOW (ref 0.7–4.0)
MCH: 34.4 pg — ABNORMAL HIGH (ref 26.0–34.0)
MCHC: 33.9 g/dL (ref 30.0–36.0)
MCV: 101.2 fL — ABNORMAL HIGH (ref 80.0–100.0)
Monocytes Absolute: 0.1 10*3/uL (ref 0.1–1.0)
Monocytes Relative: 7 %
Neutro Abs: 1.3 10*3/uL — ABNORMAL LOW (ref 1.7–7.7)
Neutrophils Relative %: 72 %
Platelet Count: 43 10*3/uL — ABNORMAL LOW (ref 150–400)
RBC: 3.26 MIL/uL — ABNORMAL LOW (ref 4.22–5.81)
RDW: 16.4 % — ABNORMAL HIGH (ref 11.5–15.5)
WBC Count: 1.8 10*3/uL — ABNORMAL LOW (ref 4.0–10.5)
nRBC: 0 % (ref 0.0–0.2)

## 2022-07-28 LAB — IRON AND IRON BINDING CAPACITY (CC-WL,HP ONLY)
Iron: 110 ug/dL (ref 45–182)
Saturation Ratios: 42 % — ABNORMAL HIGH (ref 17.9–39.5)
TIBC: 260 ug/dL (ref 250–450)
UIBC: 150 ug/dL (ref 117–376)

## 2022-07-28 LAB — LACTATE DEHYDROGENASE: LDH: 246 U/L — ABNORMAL HIGH (ref 98–192)

## 2022-07-28 LAB — RETICULOCYTES
Immature Retic Fract: 13 % (ref 2.3–15.9)
RBC.: 3.32 MIL/uL — ABNORMAL LOW (ref 4.22–5.81)
Retic Count, Absolute: 63.1 10*3/uL (ref 19.0–186.0)
Retic Ct Pct: 1.9 % (ref 0.4–3.1)

## 2022-07-28 LAB — SAVE SMEAR(SSMR), FOR PROVIDER SLIDE REVIEW

## 2022-07-28 NOTE — Progress Notes (Unsigned)
Hematology and Oncology Follow Up Visit  Patrick [name] [id number] Oct 03, 1940 82 y.o. 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  We last saw him on 05/26/2022. At this time he was doing well but his wife has been having some health concerns. He reports that she is still having some neurological and balance issues.   He has had no problem with infections, bleeding, bruising. No new bone pains or unintentional weight loss.   He has had no change in bowel or bladder habits.  He has had no nausea or vomiting.  There is been no bleeding.  He is on Coumadin s/p mechanical heart value replacement. [facility] manages his Coumadin.   Overall, his performance status is ECOG 1.  Wt Readings from Last 3 Encounters:  07/28/22 173 lb (78.5 kg)  06/24/22 173 lb (78.5 kg)  05/30/22 176 lb (79.8 kg)    Medications:  Current Outpatient Medications:    amLODipine (NORVASC) 5 MG tablet, TAKE ONE TABLET BY MOUTH ONE TIME DAILY, Disp: 90 tablet, Rfl: 2   atorvastatin (LIPITOR) 10 MG tablet, TAKE ONE TABLET BY MOUTH ONE TIME DAILY, Disp: 90 tablet, Rfl: 0   pantoprazole (PROTONIX) 40 MG tablet, TAKE ONE TABLET BY MOUTH ONE TIME DAILY (MUST KEEP 06/24/2022 APPOINTMENT FOR FUTURE REFILLS), Disp: 30 tablet, Rfl: 0   SUMAtriptan (IMITREX) 25 MG tablet, TAKE ONE TABLET BY MOUTH AT ONSET OF HEADACHE. MAY REPEAT DOSE WITH ONE TABLET IN TWO HOURS IF NEEDED. DO NOT EXCEED TWO TABLETS IN 24 HOURS., Disp: 12 tablet, Rfl: 0   tadalafil (CIALIS) 5 MG tablet, Take 1 tablet (5 mg total) by mouth daily., Disp: 90 tablet, Rfl: 3   warfarin (JANTOVEN) 7.5 MG tablet, TAKE ONE TO ONE AND A HALF TABLETS BY MOUTH DAILY OR AS DIRECTED BY THE ANTICOAGULATION CLINIC, Disp: 120 tablet, Rfl: 1  Allergies:  Allergies  Allergen Reactions   Codeine Rash   Terbinafine Itching     Past Medical History, Surgical history, Social history, and Family History were reviewed and updated.  Review of Systems: Review of Systems  Constitutional: Negative.   HENT:  Negative.    Eyes: Negative.   Respiratory: Negative.    Cardiovascular: Negative.   Gastrointestinal: Negative.   Endocrine: Negative.   Genitourinary: Negative.    Musculoskeletal: Negative.   Skin: Negative.   Neurological: Negative.   Hematological: Negative.   Psychiatric/Behavioral: Negative.      Physical Exam:  height is 6\' 6"  (1.981 m) and weight is 173 lb (78.5 kg).   Wt Readings from Last 3 Encounters:  07/28/22 173 lb (78.5 kg)  06/24/22 173 lb (78.5 kg)  05/30/22 176 lb (79.8 kg)    Physical Exam Vitals reviewed.  HENT:     Head: Normocephalic and atraumatic.  Eyes:     Pupils: Pupils are equal, round, and reactive to light.  Cardiovascular:     Rate and Rhythm: Normal rate and regular rhythm.     Heart sounds: Normal heart sounds.     Comments: Cardiac exam shows a regular rate and rhythm.  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  I hear no murmurs or rubs or bruits. Pulmonary:     Effort: Pulmonary effort is normal.     Breath sounds: Normal breath sounds.  Abdominal:  General: Bowel sounds are normal.     Palpations: Abdomen is soft.  Musculoskeletal:        General: No tenderness or deformity. Normal range of motion.     Cervical back: Normal range of motion.  Lymphadenopathy:     Cervical: No cervical adenopathy.  Skin:    General: Skin is warm and dry.     Findings: No erythema or rash.  Neurological:     Mental Status: He is alert and oriented to person, place, and time.  Psychiatric:        Behavior: Behavior normal.        Thought Content: Thought content normal.        Judgment: Judgment normal.      Lab Results  Component Value Date   WBC 1.8 (L) 07/28/2022   HGB 11.2 (L) 07/28/2022   HCT 33.0 (L) 07/28/2022   MCV 101.2  (H) 07/28/2022   PLT 43 (L) 07/28/2022     Chemistry      Component Value Date/Time   NA 137 06/24/2022 1638   NA 139 11/12/2020 1030   K 4.8 06/24/2022 1638   CL 104 06/24/2022 1638   CO2 29 06/24/2022 1638   BUN 15 06/24/2022 1638   BUN 16 11/12/2020 1030   CREATININE 1.08 06/24/2022 1638   CREATININE 1.24 05/26/2022 1041      Component Value Date/Time   CALCIUM 9.2 06/24/2022 1638   ALKPHOS 77 06/24/2022 1638   AST 24 06/24/2022 1638   AST 17 05/26/2022 1041   ALT 26 06/24/2022 1638   ALT 16 05/26/2022 1041   BILITOT 0.6 06/24/2022 1638   BILITOT 0.7 05/26/2022 1041       Impression and Plan: Patrick [name] is a 82 year old white male.  He has mild leukopenia and thrombocytopenia.  He does have a couple abnormalities on NGS testing.  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  At his last appointment Dr. Rexene Edison discussed that a bone marrow biopsy would be ideal. Given his wife's health concerns they agreed to try to postpone the biopsy until after the summer if at all possible. Today his CBC blood counts overall are a bit lower than previous. We had a long discussion about his counts and patient would like to proceed forward with a bone marrow biopsy and bone scan for further evaluation as he reports being concerned with the continued decline of his labs.    Rushie Chestnut, PA-C 6/3/202410:52 AM

## 2022-08-04 ENCOUNTER — Encounter: Payer: Self-pay | Admitting: Hematology & Oncology

## 2022-08-04 ENCOUNTER — Ambulatory Visit: Payer: Medicare Other | Attending: Internal Medicine | Admitting: *Deleted

## 2022-08-04 DIAGNOSIS — Z5181 Encounter for therapeutic drug level monitoring: Secondary | ICD-10-CM

## 2022-08-04 DIAGNOSIS — I359 Nonrheumatic aortic valve disorder, unspecified: Secondary | ICD-10-CM

## 2022-08-04 LAB — POCT INR: INR: 2 (ref 2.0–3.0)

## 2022-08-04 NOTE — Patient Instructions (Addendum)
Description   Today take 1.5 tablets then continue taking warfarin 1 tablet daily except 1.5 tablets on Fridays.  Recheck INR in 5 weeks. Call with any questions or new medications #[phone] or [phone]

## 2022-08-05 ENCOUNTER — Other Ambulatory Visit: Payer: Self-pay | Admitting: Medical Oncology

## 2022-08-05 ENCOUNTER — Encounter: Payer: Self-pay | Admitting: Cardiovascular Disease

## 2022-08-05 DIAGNOSIS — D696 Thrombocytopenia, unspecified: Secondary | ICD-10-CM

## 2022-08-05 DIAGNOSIS — D72810 Lymphocytopenia: Secondary | ICD-10-CM

## 2022-08-05 DIAGNOSIS — D709 Neutropenia, unspecified: Secondary | ICD-10-CM

## 2022-08-07 ENCOUNTER — Ambulatory Visit (INDEPENDENT_AMBULATORY_CARE_PROVIDER_SITE_OTHER): Payer: Medicare Other | Admitting: Internal Medicine

## 2022-08-07 VITALS — BP 110/60 | HR 58 | Temp 98.2°F | Ht 78.0 in | Wt 172.0 lb

## 2022-08-07 DIAGNOSIS — Z7184 Encounter for health counseling related to travel: Secondary | ICD-10-CM | POA: Diagnosis not present

## 2022-08-07 DIAGNOSIS — D708 Other neutropenia: Secondary | ICD-10-CM | POA: Diagnosis not present

## 2022-08-07 DIAGNOSIS — E559 Vitamin D deficiency, unspecified: Secondary | ICD-10-CM | POA: Diagnosis not present

## 2022-08-07 DIAGNOSIS — G43809 Other migraine, not intractable, without status migrainosus: Secondary | ICD-10-CM

## 2022-08-07 DIAGNOSIS — Z7901 Long term (current) use of anticoagulants: Secondary | ICD-10-CM | POA: Diagnosis not present

## 2022-08-07 DIAGNOSIS — Z23 Encounter for immunization: Secondary | ICD-10-CM | POA: Diagnosis not present

## 2022-08-07 DIAGNOSIS — M1990 Unspecified osteoarthritis, unspecified site: Secondary | ICD-10-CM | POA: Diagnosis not present

## 2022-08-07 DIAGNOSIS — R238 Other skin changes: Secondary | ICD-10-CM | POA: Diagnosis not present

## 2022-08-07 MED ORDER — HYDROCODONE-ACETAMINOPHEN 5-325 MG PO TABS: 1.0000 | ORAL_TABLET | Freq: Four times a day (QID) | ORAL | 0 refills | Status: DC | PRN

## 2022-08-07 MED ORDER — CIPROFLOXACIN HCL 500 MG PO TABS: 500.0000 mg | ORAL_TABLET | Freq: Two times a day (BID) | ORAL | 2 refills | Status: DC

## 2022-08-07 MED ORDER — AMOXICILLIN 500 MG PO CAPS: ORAL_CAPSULE | ORAL | 3 refills | Status: DC

## 2022-08-07 NOTE — Assessment & Plan Note (Signed)
tDAP °

## 2022-08-07 NOTE — Assessment & Plan Note (Signed)
On Vit D 

## 2022-08-07 NOTE — Progress Notes (Signed)
Subjective:  Patient ID: Patrick [name], male    DOB: [birth year]  Age: 82 y.o. MRN: [id number]  CC: No chief complaint on file.   HPI Patrick [name] presents for a skin scratch Travel advice C/o OA BM bx is pending  Outpatient Medications Prior to Visit  Medication Sig Dispense Refill   amLODipine (NORVASC) 5 MG tablet TAKE ONE TABLET BY MOUTH ONE TIME DAILY 90 tablet 2   atorvastatin (LIPITOR) 10 MG tablet TAKE ONE TABLET BY MOUTH ONE TIME DAILY 90 tablet 0   pantoprazole (PROTONIX) 40 MG tablet TAKE ONE TABLET BY MOUTH ONE TIME DAILY (MUST KEEP 06/24/2022 APPOINTMENT FOR FUTURE REFILLS) 30 tablet 0   SUMAtriptan (IMITREX) 25 MG tablet TAKE ONE TABLET BY MOUTH AT ONSET OF HEADACHE. MAY REPEAT DOSE WITH ONE TABLET IN TWO HOURS IF NEEDED. DO NOT EXCEED TWO TABLETS IN 24 HOURS. 12 tablet 0   tadalafil (CIALIS) 5 MG tablet Take 1 tablet (5 mg total) by mouth daily. 90 tablet 3   warfarin (JANTOVEN) 7.5 MG tablet TAKE ONE TO ONE AND A HALF TABLETS BY MOUTH DAILY OR AS DIRECTED BY THE ANTICOAGULATION CLINIC 120 tablet 1   No facility-administered medications prior to visit.    ROS: Review of Systems  Constitutional:  Negative for appetite change, fatigue and unexpected weight change.  HENT:  Negative for congestion, nosebleeds, sneezing, sore throat and trouble swallowing.   Eyes:  Negative for itching and visual disturbance.  Respiratory:  Negative for cough.   Cardiovascular:  Negative for chest pain, palpitations and leg swelling.  Gastrointestinal:  Negative for abdominal distention, blood in stool, diarrhea and nausea.  Genitourinary:  Negative for frequency and hematuria.  Musculoskeletal:  Positive for arthralgias. Negative for back pain, gait problem, joint swelling and neck pain.  Skin:  Negative for rash.  Neurological:  Negative for dizziness, tremors, speech difficulty and weakness.  Psychiatric/Behavioral:  Negative for agitation, dysphoric mood and sleep disturbance. The  patient is not nervous/anxious.     Objective:  BP 110/60 (BP Location: Left Arm, Patient Position: Sitting, Cuff Size: Large)   Pulse (!) 58   Temp 98.2 F (36.8 C) (Oral)   Ht 6\' 6"  (1.981 m)   Wt 172 lb (78 kg)   SpO2 98%   BMI 19.88 kg/m   BP Readings from Last 3 Encounters:  08/07/22 110/60  07/28/22 (!) 126/57  06/24/22 102/60    Wt Readings from Last 3 Encounters:  08/07/22 172 lb (78 kg)  07/28/22 173 lb (78.5 kg)  06/24/22 173 lb (78.5 kg)    Physical Exam Constitutional:      General: He is not in acute distress.    Appearance: Normal appearance. He is well-developed.     Comments: NAD  Eyes:     Conjunctiva/sclera: Conjunctivae normal.     Pupils: Pupils are equal, round, and reactive to light.  Neck:     Thyroid: No thyromegaly.     Vascular: No JVD.  Cardiovascular:     Rate and Rhythm: Normal rate and regular rhythm.     Heart sounds: Normal heart sounds. No murmur heard.    No friction rub. No gallop.  Pulmonary:     Effort: Pulmonary effort is normal. No respiratory distress.     Breath sounds: Normal breath sounds. No wheezing or rales.  Chest:     Chest wall: No tenderness.  Abdominal:     General: Bowel sounds are normal. There is no distension.  Palpations: Abdomen is soft. There is no mass.     Tenderness: There is no abdominal tenderness. There is no guarding or rebound.  Musculoskeletal:        General: No tenderness. Normal range of motion.     Cervical back: Normal range of motion.  Lymphadenopathy:     Cervical: No cervical adenopathy.  Skin:    General: Skin is warm and dry.     Findings: No rash.  Neurological:     Mental Status: He is alert and oriented to person, place, and time.     Cranial Nerves: No cranial nerve deficit.     Motor: No abnormal muscle tone.     Coordination: Coordination normal.     Gait: Gait normal.     Deep Tendon Reflexes: Reflexes are normal and symmetric.  Psychiatric:        Behavior:  Behavior normal.        Thought Content: Thought content normal.        Judgment: Judgment normal.   Minor skin scratch  Lab Results  Component Value Date   WBC 1.8 (L) 07/28/2022   HGB 11.2 (L) 07/28/2022   HCT 33.0 (L) 07/28/2022   PLT 43 (L) 07/28/2022   GLUCOSE 172 (H) 07/28/2022   CHOL 112 06/24/2022   TRIG 110.0 06/24/2022   HDL 35.50 (L) 06/24/2022   LDLDIRECT 67.0 12/22/2017   LDLCALC 54 06/24/2022   ALT 17 07/28/2022   AST 17 07/28/2022   NA 141 07/28/2022   K 4.4 07/28/2022   CL 105 07/28/2022   CREATININE 1.22 07/28/2022   BUN 15 07/28/2022   CO2 30 07/28/2022   TSH 0.55 06/24/2022   PSA 1.66 06/28/2019   INR 2.0 08/04/2022   HGBA1C 5.3 08/20/2016    DG INJECT DIAG/THERA/INC NEEDLE/CATH/PLC EPI/LUMB/SAC W/IMG  Result Date: 04/16/2022 CLINICAL DATA:  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 Left-sided pain is better after left L4-L5 epidural injection 7 weeks ago. Repeat injection on the right requested today. FLUOROSCOPY: Radiation Exposure Index (as provided by the fluoroscopic device): 2.8 mGy Kerma PROCEDURE: The procedure, risks, benefits, and alternatives were explained to the patient. Questions regarding the procedure were encouraged and answered. The patient understands and consents to the procedure. LUMBAR EPIDURAL INJECTION: An interlaminar approach was performed on the right at L4-L5. The overlying skin was cleansed and anesthetized. A 3.5 inch 20 gauge epidural needle was advanced using loss-of-resistance technique. DIAGNOSTIC EPIDURAL INJECTION: Injection of Isovue-M 200 shows a good epidural pattern with spread above and below the level of needle placement, primarily on the right. No vascular opacification is seen. THERAPEUTIC EPIDURAL INJECTION: 80 mg of Depo-Medrol mixed with 3 mL of 1% lidocaine were instilled. The procedure was well-tolerated, and the patient was discharged thirty minutes following the injection in good  condition. COMPLICATIONS: None immediate. IMPRESSION: Technically successful interlaminar epidural injection on the right at L4-L5. Electronically Signed   By: Obie Dredge M.D.   On: 04/16/2022 13:30    Assessment & Plan:   Problem List Items Addressed This Visit     Leukopenia (Chronic)    Bone marrow biopsy pending      Migraine headache    Fioricet - rare use  Potential benefits of a long term rare Fioricet  use as well as potential risks  and complications were explained to the patient and were aknowledged.        Relevant Medications   HYDROcodone-acetaminophen (NORCO/VICODIN) 5-325 MG tablet  Anticoagulant long-term use    On Coumadin.  No need to discontinue Coumadin prior to bone marrow biopsy.      Osteoarthritis    Norco as needed, rare use  Potential benefits of a long term opioids use as well as potential risks (i.e. addiction risk, apnea etc) and complications (i.e. Somnolence, constipation and others) were explained to the patient and were aknowledged.       Relevant Medications   HYDROcodone-acetaminophen (NORCO/VICODIN) 5-325 MG tablet   Travel advice encounter    Cipro prescription provided      Vitamin D deficiency    On Vit D      Skin breakdown - Primary    tDAP         Meds ordered this encounter  Medications   amoxicillin (AMOXIL) 500 MG capsule    Sig: take 1 capsule by mouth four times a day    Dispense:  20 capsule    Refill:  3   ciprofloxacin (CIPRO) 500 MG tablet    Sig: Take 1 tablet (500 mg total) by mouth 2 (two) times daily.    Dispense:  20 tablet    Refill:  2   HYDROcodone-acetaminophen (NORCO/VICODIN) 5-325 MG tablet    Sig: Take 1 tablet by mouth every 6 (six) hours as needed for severe pain.    Dispense:  20 tablet    Refill:  0      Follow-up: Return in about 3 months (around 11/07/2022).  Sonda Primes, MD

## 2022-08-11 ENCOUNTER — Encounter: Payer: Self-pay | Admitting: Internal Medicine

## 2022-08-11 NOTE — Assessment & Plan Note (Signed)
Norco as needed, rare use  Potential benefits of a long term opioids use as well as potential risks (i.e. addiction risk, apnea etc) and complications (i.e. Somnolence, constipation and others) were explained to the patient and were aknowledged.

## 2022-08-11 NOTE — Assessment & Plan Note (Signed)
Fioricet - rare use  Potential benefits of a long term rare Fioricet  use as well as potential risks  and complications were explained to the patient and were aknowledged.   

## 2022-08-11 NOTE — Assessment & Plan Note (Signed)
On Coumadin.  No need to discontinue Coumadin prior to bone marrow biopsy.

## 2022-08-11 NOTE — Assessment & Plan Note (Signed)
Cipro prescription provided ?

## 2022-08-11 NOTE — Assessment & Plan Note (Addendum)
Bone marrow biopsy pending

## 2022-08-14 ENCOUNTER — Encounter: Payer: Self-pay | Admitting: Hematology & Oncology

## 2022-08-29 ENCOUNTER — Other Ambulatory Visit: Payer: Self-pay | Admitting: Radiology

## 2022-08-29 DIAGNOSIS — D709 Neutropenia, unspecified: Secondary | ICD-10-CM
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
## 2022-09-01 ENCOUNTER — Encounter (HOSPITAL_COMMUNITY): Payer: Self-pay

## 2022-09-01 ENCOUNTER — Ambulatory Visit (HOSPITAL_COMMUNITY)
Admission: RE | Admit: 2022-09-01 | Discharge: 2022-09-01 | Disposition: A | Discharge status: Home / Self Care | Payer: Medicare Other | Source: Ambulatory Visit | Attending: Medical Oncology | Admitting: Medical Oncology

## 2022-09-01 ENCOUNTER — Other Ambulatory Visit: Payer: Self-pay

## 2022-09-01 DIAGNOSIS — R888 Abnormal findings in other body fluids and substances: Secondary | ICD-10-CM | POA: Insufficient documentation

## 2022-09-01 DIAGNOSIS — D696 Thrombocytopenia, unspecified: Secondary | ICD-10-CM

## 2022-09-01 DIAGNOSIS — Z1379 Encounter for other screening for genetic and chromosomal anomalies: Secondary | ICD-10-CM | POA: Diagnosis not present

## 2022-09-01 DIAGNOSIS — D7589 Other specified diseases of blood and blood-forming organs: Secondary | ICD-10-CM | POA: Diagnosis not present

## 2022-09-01 DIAGNOSIS — D709 Neutropenia, unspecified: Secondary | ICD-10-CM

## 2022-09-01 DIAGNOSIS — M47812 Spondylosis without myelopathy or radiculopathy, cervical region: Secondary | ICD-10-CM | POA: Diagnosis not present

## 2022-09-01 DIAGNOSIS — Z1371 Encounter for nonprocreative screening for genetic disease carrier status: Secondary | ICD-10-CM | POA: Diagnosis not present

## 2022-09-01 DIAGNOSIS — D72810 Lymphocytopenia: Secondary | ICD-10-CM | POA: Insufficient documentation

## 2022-09-01 DIAGNOSIS — D61818 Other pancytopenia: Secondary | ICD-10-CM | POA: Insufficient documentation

## 2022-09-01 DIAGNOSIS — D72819 Decreased white blood cell count, unspecified: Secondary | ICD-10-CM | POA: Diagnosis not present

## 2022-09-01 DIAGNOSIS — Z1509 Genetic susceptibility to other malignant neoplasm: Secondary | ICD-10-CM | POA: Diagnosis not present

## 2022-09-01 LAB — CBC WITH DIFFERENTIAL/PLATELET
Abs Immature Granulocytes: 0.02 10*3/uL (ref 0.00–0.07)
Basophils Absolute: 0 10*3/uL (ref 0.0–0.1)
Basophils Relative: 0 %
Eosinophils Absolute: 0 10*3/uL (ref 0.0–0.5)
Eosinophils Relative: 1 %
HCT: 32.5 % — ABNORMAL LOW (ref 39.0–52.0)
Hemoglobin: 11.2 g/dL — ABNORMAL LOW (ref 13.0–17.0)
Immature Granulocytes: 1 %
Lymphocytes Relative: 16 %
Lymphs Abs: 0.3 10*3/uL — ABNORMAL LOW (ref 0.7–4.0)
MCH: 34.8 pg — ABNORMAL HIGH (ref 26.0–34.0)
MCHC: 34.5 g/dL (ref 30.0–36.0)
MCV: 100.9 fL — ABNORMAL HIGH (ref 80.0–100.0)
Monocytes Absolute: 0.1 10*3/uL (ref 0.1–1.0)
Monocytes Relative: 5 %
Neutro Abs: 1.4 10*3/uL — ABNORMAL LOW (ref 1.7–7.7)
Neutrophils Relative %: 77 %
Platelets: 43 10*3/uL — ABNORMAL LOW (ref 150–400)
RBC: 3.22 MIL/uL — ABNORMAL LOW (ref 4.22–5.81)
RDW: 16.3 % — ABNORMAL HIGH (ref 11.5–15.5)
WBC: 1.9 10*3/uL — ABNORMAL LOW (ref 4.0–10.5)
nRBC: 0 % (ref 0.0–0.2)

## 2022-09-01 MED ORDER — FENTANYL CITRATE (PF) 100 MCG/2ML IJ SOLN
INTRAMUSCULAR | Status: AC | PRN
  Administered 2022-09-01 (×2): 50 ug via INTRAVENOUS

## 2022-09-01 MED ORDER — LIDOCAINE HCL (PF) 1 % IJ SOLN
INTRAMUSCULAR | Status: AC | PRN
  Administered 2022-09-01: 10 mL

## 2022-09-01 MED ORDER — MIDAZOLAM HCL 2 MG/2ML IJ SOLN
INTRAMUSCULAR | Status: AC | PRN
  Administered 2022-09-01 (×2): 1 mg via INTRAVENOUS

## 2022-09-01 MED ORDER — MIDAZOLAM HCL 2 MG/2ML IJ SOLN
INTRAMUSCULAR | Status: AC
  Filled 2022-09-01: qty 4

## 2022-09-01 MED ORDER — FENTANYL CITRATE (PF) 100 MCG/2ML IJ SOLN
INTRAMUSCULAR | Status: AC
  Filled 2022-09-01: qty 4

## 2022-09-01 MED ORDER — SODIUM CHLORIDE 0.9 % IV SOLN: INTRAVENOUS | Status: DC

## 2022-09-01 NOTE — Discharge Instructions (Signed)
Please call Interventional Radiology clinic [phone] with any questions or concerns.  You may remove your dressing and shower tomorrow.   Moderate Conscious Sedation, Adult, Care After This sheet gives you information about how to care for yourself after your procedure. Your health care provider may also give you more specific instructions. If you have problems or questions, contact your health careprovider. What can I expect after the procedure? After the procedure, it is common to have: Sleepiness for several hours. Impaired judgment for several hours. Difficulty with balance. Vomiting if you eat too soon. Follow these instructions at home: For the time period you were told by your health care provider: Rest. Do not participate in activities where you could fall or become injured. Do not drive or use machinery. Do not drink alcohol. Do not take sleeping pills or medicines that cause drowsiness. Do not make important decisions or sign legal documents. Do not take care of children on your own. Eating and drinking  Follow the diet recommended by your health care provider. Drink enough fluid to keep your urine pale yellow. If you vomit: Drink water, juice, or soup when you can drink without vomiting. Make sure you have little or no nausea before eating solid foods.  General instructions Take over-the-counter and prescription medicines only as told by your health care provider. Have a responsible adult stay with you for the time you are told. It is important to have someone help care for you until you are awake and alert. Do not smoke. Keep all follow-up visits as told by your health care provider. This is important. Contact a health care provider if: You are still sleepy or having trouble with balance after 24 hours. You feel light-headed. You keep feeling nauseous or you keep vomiting. You develop a rash. You have a fever. You have redness or swelling around the IV  site. Get help right away if: You have trouble breathing. You have new-onset confusion at home. Summary After the procedure, it is common to feel sleepy, have impaired judgment, or feel nauseous if you eat too soon. Rest after you get home. Know the things you should not do after the procedure. Follow the diet recommended by your health care provider and drink enough fluid to keep your urine pale yellow. Get help right away if you have trouble breathing or new-onset confusion at home. This information is not intended to replace advice given to you by your health care provider. Make sure you discuss any questions you have with your healthcare provider. Document Revised: 06/10/2019 Document Reviewed: 01/06/2019 Elsevier Patient Education  2022 Elsevier Inc.   Bone Marrow Aspiration and Bone Marrow Biopsy, Adult, Care After This sheet gives you information about how to care for yourself after your procedure. Your health care provider may also give you more specific instructions. If you have problems or questions, contact your health careprovider. What can I expect after the procedure? After the procedure, it is common to have: Mild pain and tenderness. Swelling. Bruising. Follow these instructions at home: Puncture site care Follow instructions from your health care provider about how to take care of the puncture site. Make sure you: Wash your hands with soap and water before and after you change your bandage (dressing). If soap and water are not available, use hand sanitizer. Change your dressing as told by your health care provider. Check your puncture site every day for signs of infection. Check for: More redness, swelling, or pain. Fluid or blood. Warmth. Pus or a   bad smell.  Activity Return to your normal activities as told by your health care provider. Ask your health care provider what activities are safe for you. Do not lift anything that is heavier than 10 lb (4.5 kg), or the  limit that you are told, until your health care provider says that it is safe. Do not drive for 24 hours if you were given a sedative during your procedure. General instructions Take over-the-counter and prescription medicines only as told by your health care provider. Do not take baths, swim, or use a hot tub until your health care provider approves. Ask your health care provider if you may take showers. You may only be allowed to take sponge baths. If directed, put ice on the affected area. To do this: Put ice in a plastic bag. Place a towel between your skin and the bag. Leave the ice on for 20 minutes, 2-3 times a day. Keep all follow-up visits as told by your health care provider. This is important.  Contact a health care provider if: Your pain is not controlled with medicine. You have a fever. You have more redness, swelling, or pain around the puncture site. You have fluid or blood coming from the puncture site. Your puncture site feels warm to the touch. You have pus or a bad smell coming from the puncture site. Summary After the procedure, it is common to have mild pain, tenderness, swelling, and bruising. Follow instructions from your health care provider about how to take care of the puncture site and what activities are safe for you. Take over-the-counter and prescription medicines only as told by your health care provider. Contact a health care provider if you have any signs of infection, such as fluid or blood coming from the puncture site. This information is not intended to replace advice given to you by your health care provider. Make sure you discuss any questions you have with your healthcare provider. Document Revised: 06/29/2018 Document Reviewed: 06/29/2018 Elsevier Patient Education  2022 Elsevier Inc.  

## 2022-09-01 NOTE — Procedures (Signed)
Pre-procedure Diagnosis: Worsening neutropenia and thrombocytopenia.  Post-procedure Diagnosis: Same  Technically successful CT guided bone marrow aspiration and biopsy of left iliac crest.   Complications: None Immediate  EBL: None  Signed: Simonne Come Pager: [phone] 09/01/2022, 10:56 AM

## 2022-09-03 ENCOUNTER — Ambulatory Visit: Payer: Medicare Other | Admitting: Urology

## 2022-09-03 ENCOUNTER — Other Ambulatory Visit: Payer: Self-pay | Admitting: Internal Medicine

## 2022-09-03 LAB — SURGICAL PATHOLOGY

## 2022-09-04 ENCOUNTER — Encounter: Payer: Self-pay | Admitting: Hematology & Oncology

## 2022-09-04 ENCOUNTER — Ambulatory Visit (INDEPENDENT_AMBULATORY_CARE_PROVIDER_SITE_OTHER): Payer: Medicare Other | Admitting: Urology

## 2022-09-04 ENCOUNTER — Encounter: Payer: Self-pay | Admitting: Urology

## 2022-09-04 VITALS — BP 155/79 | HR 67 | Ht 78.0 in | Wt 172.7 lb

## 2022-09-04 DIAGNOSIS — N401 Enlarged prostate with lower urinary tract symptoms: Secondary | ICD-10-CM

## 2022-09-04 DIAGNOSIS — N529 Male erectile dysfunction, unspecified: Secondary | ICD-10-CM | POA: Diagnosis not present

## 2022-09-04 DIAGNOSIS — N138 Other obstructive and reflux uropathy: Secondary | ICD-10-CM

## 2022-09-04 LAB — BLADDER SCAN AMB NON-IMAGING

## 2022-09-04 MED ORDER — TADALAFIL 5 MG PO TABS
5.0000 mg | ORAL_TABLET | Freq: Every day | ORAL | 3 refills | Status: DC
Start: 2022-09-04 — End: 2023-09-10

## 2022-09-04 NOTE — Progress Notes (Signed)
   09/04/2022 2:53 PM   Patrick [name] 19-Dec-1940 [id number]  Reason for visit: Follow up BPH, ED  HPI: He is an 82 year old male with mechanical valve on Coumadin who underwent a PAE in February 2020 at [facility](Dr. Juanetta Beets) for a 120 g prostate.  He did very well after this procedure and strength of urinary stream has improved significantly.  He thinks over the last few years his symptoms have worsened by about 25%, and he may be interested in repeat PAE in the next few years.  PVR today is normal at 5 mL.  He takes Cialis 5 mg daily which he feels improves the strength of the urinary stream.  He has erectile dysfunction as well but has not seen a significant improvement on the Cialis, even when increasing the dose to 20 mg.  He is not interested in other treatments for ED at this point.  Recently underwent bone marrow biopsy for pancytopenia, those results are pending and he has follow-up next week with his oncologist to review those results.  Cialis refilled Okay to place referral to Dr. Henderson Baltimore with [facility] radiology to consider PAE if patient desires in the future RTC 1 year PVR   Sondra Come, MD  [facility] [address] [place], [place] [address] [phone]

## 2022-09-04 NOTE — Patient Instructions (Addendum)
If you decide to pursue another prostatic artery embolization, I would recommend Dr. Henderson Baltimore in [place] with [facility] radiology.  [email or web address]

## 2022-09-08 ENCOUNTER — Ambulatory Visit: Payer: Medicare Other | Attending: Cardiology

## 2022-09-08 ENCOUNTER — Encounter (HOSPITAL_COMMUNITY): Payer: Self-pay | Admitting: Medical Oncology

## 2022-09-08 DIAGNOSIS — I359 Nonrheumatic aortic valve disorder, unspecified: Secondary | ICD-10-CM | POA: Insufficient documentation

## 2022-09-08 DIAGNOSIS — Z5181 Encounter for therapeutic drug level monitoring: Secondary | ICD-10-CM | POA: Insufficient documentation

## 2022-09-08 LAB — POCT INR: INR: 2.3 (ref 2.0–3.0)

## 2022-09-08 NOTE — Patient Instructions (Signed)
continue taking warfarin 1 tablet daily except 1.5 tablets on Fridays.  Recheck INR in 6 weeks. Call with any questions or new medications #[phone] or [phone]

## 2022-09-10 ENCOUNTER — Other Ambulatory Visit: Payer: Self-pay

## 2022-09-10 DIAGNOSIS — D696 Thrombocytopenia, unspecified: Secondary | ICD-10-CM

## 2022-09-11 ENCOUNTER — Inpatient Hospital Stay: Payer: Medicare Other | Attending: Hematology & Oncology

## 2022-09-11 ENCOUNTER — Inpatient Hospital Stay (HOSPITAL_BASED_OUTPATIENT_CLINIC_OR_DEPARTMENT_OTHER): Payer: Medicare Other | Admitting: Hematology & Oncology

## 2022-09-11 ENCOUNTER — Encounter: Payer: Self-pay | Admitting: Hematology & Oncology

## 2022-09-11 VITALS — BP 149/72 | HR 64 | Temp 98.3°F | Resp 20 | Ht 78.0 in | Wt 171.8 lb

## 2022-09-11 DIAGNOSIS — D4621 Refractory anemia with excess of blasts 1: Secondary | ICD-10-CM | POA: Diagnosis not present

## 2022-09-11 DIAGNOSIS — D46Z Other myelodysplastic syndromes: Secondary | ICD-10-CM | POA: Diagnosis not present

## 2022-09-11 DIAGNOSIS — D696 Thrombocytopenia, unspecified: Secondary | ICD-10-CM | POA: Insufficient documentation

## 2022-09-11 DIAGNOSIS — D72819 Decreased white blood cell count, unspecified: Secondary | ICD-10-CM | POA: Insufficient documentation

## 2022-09-11 DIAGNOSIS — Z7901 Long term (current) use of anticoagulants: Secondary | ICD-10-CM | POA: Insufficient documentation

## 2022-09-11 DIAGNOSIS — D4620 Refractory anemia with excess of blasts, unspecified: Secondary | ICD-10-CM | POA: Diagnosis not present

## 2022-09-11 HISTORY — DX: Other myelodysplastic syndromes: D46.Z

## 2022-09-11 HISTORY — DX: Refractory anemia with excess of blasts 1: D46.21

## 2022-09-11 LAB — CBC WITH DIFFERENTIAL (CANCER CENTER ONLY)
Abs Immature Granulocytes: 0.01 10*3/uL (ref 0.00–0.07)
Basophils Absolute: 0 10*3/uL (ref 0.0–0.1)
Basophils Relative: 0 %
Eosinophils Absolute: 0 10*3/uL (ref 0.0–0.5)
Eosinophils Relative: 1 %
HCT: 34.3 % — ABNORMAL LOW (ref 39.0–52.0)
Hemoglobin: 11.7 g/dL — ABNORMAL LOW (ref 13.0–17.0)
Immature Granulocytes: 1 %
Lymphocytes Relative: 19 %
Lymphs Abs: 0.4 10*3/uL — ABNORMAL LOW (ref 0.7–4.0)
MCH: 34.3 pg — ABNORMAL HIGH (ref 26.0–34.0)
MCHC: 34.1 g/dL (ref 30.0–36.0)
MCV: 100.6 fL — ABNORMAL HIGH (ref 80.0–100.0)
Monocytes Absolute: 0.2 10*3/uL (ref 0.1–1.0)
Monocytes Relative: 8 %
Neutro Abs: 1.6 10*3/uL — ABNORMAL LOW (ref 1.7–7.7)
Neutrophils Relative %: 71 %
Platelet Count: 45 10*3/uL — ABNORMAL LOW (ref 150–400)
RBC: 3.41 MIL/uL — ABNORMAL LOW (ref 4.22–5.81)
RDW: 16.5 % — ABNORMAL HIGH (ref 11.5–15.5)
WBC Count: 2.2 10*3/uL — ABNORMAL LOW (ref 4.0–10.5)
nRBC: 0 % (ref 0.0–0.2)

## 2022-09-11 LAB — CMP (CANCER CENTER ONLY)
ALT: 14 U/L (ref 0–44)
AST: 17 U/L (ref 15–41)
Albumin: 4.4 g/dL (ref 3.5–5.0)
Alkaline Phosphatase: 68 U/L (ref 38–126)
Anion gap: 6 (ref 5–15)
BUN: 17 mg/dL (ref 8–23)
CO2: 30 mmol/L (ref 22–32)
Calcium: 10.2 mg/dL (ref 8.9–10.3)
Chloride: 104 mmol/L (ref 98–111)
Creatinine: 1.13 mg/dL (ref 0.61–1.24)
GFR, Estimated: >60 mL/min (ref >60)
Glucose, Bld: 115 mg/dL — ABNORMAL HIGH (ref 70–99)
Potassium: 4.5 mmol/L (ref 3.5–5.1)
Sodium: 140 mmol/L (ref 135–145)
Total Bilirubin: 0.6 mg/dL (ref 0.3–1.2)
Total Protein: 6.4 g/dL — ABNORMAL LOW (ref 6.5–8.1)

## 2022-09-11 LAB — LACTATE DEHYDROGENASE: LDH: 260 U/L — ABNORMAL HIGH (ref 98–192)

## 2022-09-11 LAB — FERRITIN: Ferritin: 394 ng/mL — ABNORMAL HIGH (ref 24–336)

## 2022-09-11 LAB — RETICULOCYTES
Immature Retic Fract: 13 % (ref 2.3–15.9)
RBC.: 3.34 MIL/uL — ABNORMAL LOW (ref 4.22–5.81)
Retic Count, Absolute: 54.8 10*3/uL (ref 19.0–186.0)
Retic Ct Pct: 1.6 % (ref 0.4–3.1)

## 2022-09-11 LAB — SAVE SMEAR(SSMR), FOR PROVIDER SLIDE REVIEW

## 2022-09-11 NOTE — Progress Notes (Signed)
Hematology and Oncology Follow Up Visit  Patrick [name] [id number] 16-Apr-1940 82 y.o. 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   He has he did have a bone marrow biopsy.  This was done on 09/01/2018  4.  The pathology report [id number]) showed that there was a hypercellular marrow.  He had borderline increased blast.  He had was probably refractory anemia with excess blast-1.  Of note, there was 5% of B cells that had a immunophenotype that was consistent with hairy cell leukemia.  Surprisingly also was that we did a BRAF analysis in the bone marrow.  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  I am unsure exactly if this is significant for right now.  He is on Coumadin.  He has a mechanical heart valve.  He really is not all that bothered by the pancytopenia.  This has been relatively stable for him.  I told he and his wife that at some point, he probably will need to be treated.  However, I really think that at the present time, that the risk outweigh the benefits for him.  He is still quite active.  He is still traveling.  He works in Materials engineer.  He travels quite often.  I think that I just would not be affecting his overall quality of life in a good way right now.  He has had no fever.  He has had no cough.  He has had no nausea or vomiting.  There is been no change in bowel or bladder habits.  Overall, I would say that his performance status is ECOG 0.    Medications:  Current Outpatient Medications:    amLODipine (NORVASC) 5 MG tablet, TAKE ONE TABLET BY MOUTH ONE TIME DAILY, Disp: 90 tablet, Rfl: 2   atorvastatin (LIPITOR) 10 MG tablet, TAKE ONE TABLET BY MOUTH ONE TIME DAILY, Disp: 90 tablet, Rfl: 2   pantoprazole (PROTONIX) 40 MG tablet, TAKE ONE TABLET BY MOUTH ONE TIME DAILY (MUST KEEP 06/24/2022  APPOINTMENT FOR FUTURE REFILLS), Disp: 30 tablet, Rfl: 0   tadalafil (CIALIS) 5 MG tablet, Take 1 tablet (5 mg total) by mouth daily., Disp: 90 tablet, Rfl: 3   warfarin (JANTOVEN) 7.5 MG tablet, TAKE ONE TO ONE AND A HALF TABLETS BY MOUTH DAILY OR AS DIRECTED BY THE ANTICOAGULATION CLINIC, Disp: 120 tablet, Rfl: 1   amoxicillin (AMOXIL) 500 MG capsule, take 1 capsule by mouth four times a day (Patient not taking: Reported on 09/11/2022), Disp: 20 capsule, Rfl: 3   ciprofloxacin (CIPRO) 500 MG tablet, Take 1 tablet (500 mg total) by mouth 2 (two) times daily. (Patient not taking: Reported on 09/11/2022), Disp: 20 tablet, Rfl: 2   HYDROcodone-acetaminophen (NORCO/VICODIN) 5-325 MG tablet, Take 1 tablet by mouth every 6 (six) hours as needed for severe pain. (Patient not taking: Reported on 09/11/2022), Disp: 20 tablet, Rfl: 0   SUMAtriptan (IMITREX) 25 MG tablet, TAKE ONE TABLET BY MOUTH AT ONSET OF HEADACHE. MAY REPEAT DOSE WITH ONE TABLET IN TWO HOURS IF NEEDED. DO NOT EXCEED TWO TABLETS IN 24 HOURS. (Patient not taking: Reported on 09/11/2022), Disp: 12 tablet, Rfl: 0  Allergies:  Allergies  Allergen Reactions   Codeine Rash   Terbinafine Itching    Past Medical History, Surgical history, Social history, and Family History were reviewed and updated.  Review of Systems: Review of Systems  Constitutional: Negative.   HENT:  Negative.    Eyes: Negative.   Respiratory: Negative.    Cardiovascular: Negative.   Gastrointestinal: Negative.   Endocrine: Negative.   Genitourinary: Negative.    Musculoskeletal: Negative.   Skin: Negative.   Neurological: Negative.   Hematological: Negative.   Psychiatric/Behavioral: Negative.      Physical Exam:  height is 6\' 6"  (1.981 m) and weight is 171 lb 12.8 oz (77.9 kg). His oral temperature is 98.3 F (36.8 C). His blood pressure is 132/64 (pended) and his pulse is 64. His respiration is 20 and oxygen saturation is 97%.   Wt Readings from Last 3  Encounters:  09/11/22 171 lb 12.8 oz (77.9 kg)  09/04/22 172 lb 11.2 oz (78.3 kg)  09/01/22 170 lb (77.1 kg)    Physical Exam Vitals reviewed.  HENT:     Head: Normocephalic and atraumatic.  Eyes:     Pupils: Pupils are equal, round, and reactive to light.  Cardiovascular:     Rate and Rhythm: Normal rate and regular rhythm.     Heart sounds: Normal heart sounds.     Comments: Cardiac exam shows a regular rate and rhythm.  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  I hear no murmurs or rubs or bruits. Pulmonary:     Effort: Pulmonary effort is normal.     Breath sounds: Normal breath sounds.  Abdominal:     General: Bowel sounds are normal.     Palpations: Abdomen is soft.  Musculoskeletal:        General: No tenderness or deformity. Normal range of motion.     Cervical back: Normal range of motion.  Lymphadenopathy:     Cervical: No cervical adenopathy.  Skin:    General: Skin is warm and dry.     Findings: No erythema or rash.  Neurological:     Mental Status: He is alert and oriented to person, place, and time.  Psychiatric:        Behavior: Behavior normal.        Thought Content: Thought content normal.        Judgment: Judgment normal.     Lab Results  Component Value Date   WBC 2.2 (L) 09/11/2022   HGB 11.7 (L) 09/11/2022   HCT 34.3 (L) 09/11/2022   MCV 100.6 (H) 09/11/2022   PLT 45 (L) 09/11/2022     Chemistry      Component Value Date/Time   NA 140 09/11/2022 1457   NA 139 11/12/2020 1030   K 4.5 09/11/2022 1457   CL 104 09/11/2022 1457   CO2 30 09/11/2022 1457   BUN 17 09/11/2022 1457   BUN 16 11/12/2020 1030   CREATININE 1.13 09/11/2022 1457      Component Value Date/Time   CALCIUM 10.2 09/11/2022 1457   ALKPHOS 68 09/11/2022 1457   AST 17 09/11/2022 1457   ALT 14 09/11/2022 1457   BILITOT 0.6 09/11/2022 1457       Impression and Plan: Patrick [name] is a 82 year old white male.  He has mild leukopenia and  thrombocytopenia.  If I did do a bone marrow biopsy on him.  This confirmed that he has myelodysplasia.  Again, I would classify him as refractory anemia with excess blast-1  (RAEB-1).  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  Given the fact that there is the BRAF mutation certainly would make this potentially significant.  I do not think that this is a situation where we would put him on BRAF therapy.  I think if are going to treat him, we would have to treat him with mild dysplastic protocol.  I would probably consider him for Vidaza/venetoclax.  Again, we really have to be careful because he is on Coumadin.  He certainly would have a risk of bleeding given that he likely would have more thrombocytopenia before everything got better.  Again I just think we have to watch him.  I would want him come back to see me in 1 month or so.  Again, his blood counts have been holding steady.  He has been asymptomatic.  I think we have to keep this in mind.     [name], MD 7/18/20244:13 PM

## 2022-09-12 LAB — IRON AND IRON BINDING CAPACITY (CC-WL,HP ONLY)
Iron: 104 ug/dL (ref 45–182)
Saturation Ratios: 38 % (ref 17.9–39.5)
TIBC: 272 ug/dL (ref 250–450)
UIBC: 168 ug/dL (ref 117–376)

## 2022-10-06 ENCOUNTER — Encounter: Payer: Self-pay | Admitting: Internal Medicine

## 2022-10-06 NOTE — Telephone Encounter (Signed)
Lomotil not on med list. Pls advise .Marland KitchenRaechel Chute

## 2022-10-07 ENCOUNTER — Other Ambulatory Visit: Payer: Self-pay | Admitting: Internal Medicine

## 2022-10-07 MED ORDER — DIPHENOXYLATE-ATROPINE 2.5-0.025 MG PO TABS: 1.0000 | ORAL_TABLET | Freq: Four times a day (QID) | ORAL | 1 refills | Status: DC | PRN

## 2022-10-10 ENCOUNTER — Other Ambulatory Visit: Payer: Self-pay

## 2022-10-10 ENCOUNTER — Inpatient Hospital Stay: Payer: Medicare Other | Admitting: Hematology & Oncology

## 2022-10-10 ENCOUNTER — Inpatient Hospital Stay: Payer: Medicare Other | Attending: Hematology & Oncology

## 2022-10-10 ENCOUNTER — Encounter: Payer: Self-pay | Admitting: Hematology & Oncology

## 2022-10-10 VITALS — BP 136/62 | HR 56 | Temp 97.9°F | Resp 18 | Ht 78.0 in | Wt 171.1 lb

## 2022-10-10 DIAGNOSIS — D72819 Decreased white blood cell count, unspecified: Secondary | ICD-10-CM | POA: Diagnosis not present

## 2022-10-10 DIAGNOSIS — D46Z Other myelodysplastic syndromes: Secondary | ICD-10-CM | POA: Diagnosis not present

## 2022-10-10 DIAGNOSIS — D4621 Refractory anemia with excess of blasts 1: Secondary | ICD-10-CM | POA: Diagnosis not present

## 2022-10-10 DIAGNOSIS — D696 Thrombocytopenia, unspecified: Secondary | ICD-10-CM | POA: Insufficient documentation

## 2022-10-10 DIAGNOSIS — Z7901 Long term (current) use of anticoagulants: Secondary | ICD-10-CM | POA: Insufficient documentation

## 2022-10-10 LAB — CBC WITH DIFFERENTIAL (CANCER CENTER ONLY)
Abs Immature Granulocytes: 0.01 10*3/uL (ref 0.00–0.07)
Basophils Absolute: 0 10*3/uL (ref 0.0–0.1)
Basophils Relative: 0 %
Eosinophils Absolute: 0 10*3/uL (ref 0.0–0.5)
Eosinophils Relative: 1 %
HCT: 31.1 % — ABNORMAL LOW (ref 39.0–52.0)
Hemoglobin: 10.9 g/dL — ABNORMAL LOW (ref 13.0–17.0)
Immature Granulocytes: 0 %
Lymphocytes Relative: 15 %
Lymphs Abs: 0.3 10*3/uL — ABNORMAL LOW (ref 0.7–4.0)
MCH: 34.8 pg — ABNORMAL HIGH (ref 26.0–34.0)
MCHC: 35 g/dL (ref 30.0–36.0)
MCV: 99.4 fL (ref 80.0–100.0)
Monocytes Absolute: 0.1 10*3/uL (ref 0.1–1.0)
Monocytes Relative: 6 %
Neutro Abs: 1.8 10*3/uL (ref 1.7–7.7)
Neutrophils Relative %: 78 %
Platelet Count: 44 10*3/uL — ABNORMAL LOW (ref 150–400)
RBC: 3.13 MIL/uL — ABNORMAL LOW (ref 4.22–5.81)
RDW: 16.5 % — ABNORMAL HIGH (ref 11.5–15.5)
WBC Count: 2.3 10*3/uL — ABNORMAL LOW (ref 4.0–10.5)
nRBC: 0 % (ref 0.0–0.2)

## 2022-10-10 LAB — CMP (CANCER CENTER ONLY)
ALT: 15 U/L (ref 0–44)
AST: 17 U/L (ref 15–41)
Albumin: 4.1 g/dL (ref 3.5–5.0)
Alkaline Phosphatase: 64 U/L (ref 38–126)
Anion gap: 5 (ref 5–15)
BUN: 20 mg/dL (ref 8–23)
CO2: 28 mmol/L (ref 22–32)
Calcium: 9.2 mg/dL (ref 8.9–10.3)
Chloride: 107 mmol/L (ref 98–111)
Creatinine: 1.1 mg/dL (ref 0.61–1.24)
GFR, Estimated: >60 mL/min (ref >60)
Glucose, Bld: 97 mg/dL (ref 70–99)
Potassium: 4.3 mmol/L (ref 3.5–5.1)
Sodium: 140 mmol/L (ref 135–145)
Total Bilirubin: 0.7 mg/dL (ref 0.3–1.2)
Total Protein: 6 g/dL — ABNORMAL LOW (ref 6.5–8.1)

## 2022-10-10 LAB — FERRITIN: Ferritin: 328 ng/mL (ref 24–336)

## 2022-10-10 LAB — RETICULOCYTES
Immature Retic Fract: 14.5 % (ref 2.3–15.9)
RBC.: 3.15 MIL/uL — ABNORMAL LOW (ref 4.22–5.81)
Retic Count, Absolute: 54.5 10*3/uL (ref 19.0–186.0)
Retic Ct Pct: 1.7 % (ref 0.4–3.1)

## 2022-10-10 LAB — SAVE SMEAR(SSMR), FOR PROVIDER SLIDE REVIEW

## 2022-10-10 LAB — LACTATE DEHYDROGENASE: LDH: 260 U/L — ABNORMAL HIGH (ref 98–192)

## 2022-10-10 NOTE — Progress Notes (Signed)
Hematology and Oncology Follow Up Visit  LAURENT BRAXTON [id number] 09-20-1940 82 y.o. 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 is back for his follow-up.   We last saw him 4 weeks ago.  He, unfortunately developed a hematoma with the right eye.  Again, he is on Coumadin.  He does have the thrombocytopenia from his myelodysplasia.  He otherwise seems to be doing fairly well.  He really has had no specific complaints.  There is no nausea or vomiting.  He has had no change in bowel or bladder habits.  He has had no rashes.  He has had no leg swelling.  He has not been traveling with his job.  He has had no problems with cough or shortness of breath.  Overall, I would say that his performance status is probably ECOG 1.      Medications:  Current Outpatient Medications:    amLODipine (NORVASC) 5 MG tablet, TAKE ONE TABLET BY MOUTH ONE TIME DAILY, Disp: 90 tablet, Rfl: 2   atorvastatin (LIPITOR) 10 MG tablet, TAKE ONE TABLET BY MOUTH ONE TIME DAILY, Disp: 90 tablet, Rfl: 2   diphenoxylate-atropine (LOMOTIL) 2.5-0.025 MG tablet, Take 1 tablet by mouth 4 (four) times daily as needed for diarrhea or loose stools., Disp: 60 tablet, Rfl: 1   pantoprazole (PROTONIX) 40 MG tablet, TAKE ONE TABLET BY MOUTH ONE TIME DAILY (MUST KEEP 06/24/2022 APPOINTMENT FOR FUTURE REFILLS), Disp: 30 tablet, Rfl: 0   tadalafil (CIALIS) 5 MG tablet, Take 1 tablet (5 mg total) by mouth daily., Disp: 90 tablet, Rfl: 3   warfarin (JANTOVEN) 7.5 MG tablet, TAKE ONE TO ONE AND A HALF TABLETS BY MOUTH DAILY OR AS DIRECTED BY THE ANTICOAGULATION CLINIC, Disp: 120 tablet, Rfl: 1   amoxicillin (AMOXIL) 500 MG capsule, take 1 capsule by mouth four times a day (Patient not taking: Reported on 09/11/2022), Disp: 20 capsule, Rfl: 3   ciprofloxacin (CIPRO) 500 MG tablet, Take 1 tablet (500 mg total) by  mouth 2 (two) times daily. (Patient not taking: Reported on 09/11/2022), Disp: 20 tablet, Rfl: 2   HYDROcodone-acetaminophen (NORCO/VICODIN) 5-325 MG tablet, Take 1 tablet by mouth every 6 (six) hours as needed for severe pain. (Patient not taking: Reported on 09/11/2022), Disp: 20 tablet, Rfl: 0   SUMAtriptan (IMITREX) 25 MG tablet, TAKE ONE TABLET BY MOUTH AT ONSET OF HEADACHE. MAY REPEAT DOSE WITH ONE TABLET IN TWO HOURS IF NEEDED. DO NOT EXCEED TWO TABLETS IN 24 HOURS. (Patient not taking: Reported on 09/11/2022), Disp: 12 tablet, Rfl: 0  Allergies:  Allergies  Allergen Reactions   Codeine Rash   Terbinafine Itching    Past Medical History, Surgical history, Social history, and Family History were reviewed and updated.  Review of Systems: Review of Systems  Constitutional: Negative.   HENT:  Negative.    Eyes: Negative.   Respiratory: Negative.    Cardiovascular: Negative.   Gastrointestinal: Negative.   Endocrine: Negative.   Genitourinary: Negative.    Musculoskeletal: Negative.   Skin: Negative.   Neurological: Negative.   Hematological: Negative.   Psychiatric/Behavioral: Negative.      Physical Exam:  height is 6\' 6"  (1.981 m) and weight is 171 lb 1.3 oz (77.6 kg). His oral temperature is 97.9 F (36.6 C). His blood pressure is 136/62 and his pulse is 56 (abnormal). His respiration is 18 and oxygen saturation  is 100%.   Wt Readings from Last 3 Encounters:  10/10/22 171 lb 1.3 oz (77.6 kg)  09/11/22 171 lb 12.8 oz (77.9 kg)  09/04/22 172 lb 11.2 oz (78.3 kg)    Physical Exam Vitals reviewed.  HENT:     Head: Normocephalic and atraumatic.  Eyes:     Pupils: Pupils are equal, round, and reactive to light.     Comments: Ocular exam does show the conjunctival hemorrhage with the right eye.  Pupil reacts appropriately.  He has good extraocular muscle movement.  Cardiovascular:     Rate and Rhythm: Normal rate and regular rhythm.     Heart sounds: Normal heart sounds.      Comments: Cardiac exam shows a regular rate and rhythm.  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  I hear no murmurs or rubs or bruits. Pulmonary:     Effort: Pulmonary effort is normal.     Breath sounds: Normal breath sounds.  Abdominal:     General: Bowel sounds are normal.     Palpations: Abdomen is soft.  Musculoskeletal:        General: No tenderness or deformity. Normal range of motion.     Cervical back: Normal range of motion.  Lymphadenopathy:     Cervical: No cervical adenopathy.  Skin:    General: Skin is warm and dry.     Findings: No erythema or rash.  Neurological:     Mental Status: He is alert and oriented to person, place, and time.  Psychiatric:        Behavior: Behavior normal.        Thought Content: Thought content normal.        Judgment: Judgment normal.      Lab Results  Component Value Date   WBC 2.3 (L) 10/10/2022   HGB 10.9 (L) 10/10/2022   HCT 31.1 (L) 10/10/2022   MCV 99.4 10/10/2022   PLT 44 (L) 10/10/2022     Chemistry      Component Value Date/Time   NA 140 10/10/2022 1505   NA 139 11/12/2020 1030   K 4.3 10/10/2022 1505   CL 107 10/10/2022 1505   CO2 28 10/10/2022 1505   BUN 20 10/10/2022 1505   BUN 16 11/12/2020 1030   CREATININE 1.10 10/10/2022 1505      Component Value Date/Time   CALCIUM 9.2 10/10/2022 1505   ALKPHOS 64 10/10/2022 1505   AST 17 10/10/2022 1505   ALT 15 10/10/2022 1505   BILITOT 0.7 10/10/2022 1505       Impression and Plan: Mr. Harwell is a 82 year old white male.  He has mild leukopenia and thrombocytopenia.  If I did do a bone marrow biopsy on him.  This confirmed that he has myelodysplasia.  Again, I would classify him as refractory anemia with excess blast-1  (RAEB-1).  I am still willing to wait a little bit longer to treat him.  His white cell count is holding steady.  His platelet count is low but stable.  I looked at his blood smear.  I saw nothing on the blood smear  that looked suspicious.  I know that we will eventually have to treat him.  I would not surprise me if we had to do this before the end of the year.  I am just trying to hold off as long as possible.  I would like to see him back in 4 weeks.  Think that if we do  treat him, he is fine have to go onto Lovenox or heparin and not Coumadin given the difficulties with managing the Coumadin and the platelet fluctuations.  If so, I would have to talk to his cardiologist.    [name], MD 8/16/20244:16 PM

## 2022-10-13 ENCOUNTER — Encounter: Payer: Self-pay | Admitting: Hematology & Oncology

## 2022-10-13 LAB — IRON AND IRON BINDING CAPACITY (CC-WL,HP ONLY)
Iron: 165 ug/dL (ref 45–182)
Saturation Ratios: 66 % — ABNORMAL HIGH (ref 17.9–39.5)
TIBC: 252 ug/dL (ref 250–450)
UIBC: 87 ug/dL — ABNORMAL LOW (ref 117–376)

## 2022-10-14 ENCOUNTER — Ambulatory Visit: Payer: Medicare Other

## 2022-10-14 DIAGNOSIS — Z5181 Encounter for therapeutic drug level monitoring: Secondary | ICD-10-CM | POA: Diagnosis not present

## 2022-10-14 DIAGNOSIS — I359 Nonrheumatic aortic valve disorder, unspecified: Secondary | ICD-10-CM | POA: Diagnosis not present

## 2022-10-14 LAB — POCT INR: INR: 2.2 (ref 2.0–3.0)

## 2022-10-14 NOTE — Patient Instructions (Signed)
continue taking warfarin 1 tablet daily except 1.5 tablets on Fridays.  Recheck INR in 6 weeks. Call with any questions or new medications #[phone] or [phone]

## 2022-10-21 ENCOUNTER — Ambulatory Visit: Payer: Medicare Other | Admitting: Internal Medicine

## 2022-10-21 ENCOUNTER — Encounter: Payer: Self-pay | Admitting: Hematology & Oncology

## 2022-10-21 ENCOUNTER — Ambulatory Visit: Payer: Medicare Other

## 2022-11-05 DIAGNOSIS — Z85828 Personal history of other malignant neoplasm of skin: Secondary | ICD-10-CM | POA: Diagnosis not present

## 2022-11-05 DIAGNOSIS — L570 Actinic keratosis: Secondary | ICD-10-CM | POA: Diagnosis not present

## 2022-11-05 DIAGNOSIS — S40862A Insect bite (nonvenomous) of left upper arm, initial encounter: Secondary | ICD-10-CM | POA: Diagnosis not present

## 2022-11-07 ENCOUNTER — Inpatient Hospital Stay (HOSPITAL_BASED_OUTPATIENT_CLINIC_OR_DEPARTMENT_OTHER): Payer: Medicare Other | Admitting: Hematology & Oncology

## 2022-11-07 ENCOUNTER — Other Ambulatory Visit: Payer: Self-pay

## 2022-11-07 ENCOUNTER — Encounter: Payer: Self-pay | Admitting: Hematology & Oncology

## 2022-11-07 ENCOUNTER — Inpatient Hospital Stay: Payer: Medicare Other | Attending: Hematology & Oncology

## 2022-11-07 VITALS — BP 118/59 | HR 66 | Temp 98.1°F | Resp 17 | Ht 78.0 in | Wt 172.0 lb

## 2022-11-07 DIAGNOSIS — D46Z Other myelodysplastic syndromes: Secondary | ICD-10-CM | POA: Diagnosis not present

## 2022-11-07 DIAGNOSIS — D4621 Refractory anemia with excess of blasts 1: Secondary | ICD-10-CM | POA: Diagnosis not present

## 2022-11-07 LAB — CBC WITH DIFFERENTIAL (CANCER CENTER ONLY)
Abs Immature Granulocytes: 0.04 10*3/uL (ref 0.00–0.07)
Basophils Absolute: 0 10*3/uL (ref 0.0–0.1)
Basophils Relative: 0 %
Eosinophils Absolute: 0 10*3/uL (ref 0.0–0.5)
Eosinophils Relative: 1 %
HCT: 31.9 % — ABNORMAL LOW (ref 39.0–52.0)
Hemoglobin: 10.9 g/dL — ABNORMAL LOW (ref 13.0–17.0)
Immature Granulocytes: 2 %
Lymphocytes Relative: 12 %
Lymphs Abs: 0.3 10*3/uL — ABNORMAL LOW (ref 0.7–4.0)
MCH: 34.3 pg — ABNORMAL HIGH (ref 26.0–34.0)
MCHC: 34.2 g/dL (ref 30.0–36.0)
MCV: 100.3 fL — ABNORMAL HIGH (ref 80.0–100.0)
Monocytes Absolute: 0.2 10*3/uL (ref 0.1–1.0)
Monocytes Relative: 6 %
Neutro Abs: 1.8 10*3/uL (ref 1.7–7.7)
Neutrophils Relative %: 79 %
Platelet Count: 42 10*3/uL — ABNORMAL LOW (ref 150–400)
RBC: 3.18 MIL/uL — ABNORMAL LOW (ref 4.22–5.81)
RDW: 16.6 % — ABNORMAL HIGH (ref 11.5–15.5)
WBC Count: 2.3 10*3/uL — ABNORMAL LOW (ref 4.0–10.5)
nRBC: 0 % (ref 0.0–0.2)

## 2022-11-07 LAB — CMP (CANCER CENTER ONLY)
ALT: 15 U/L (ref 0–44)
AST: 18 U/L (ref 15–41)
Albumin: 4.4 g/dL (ref 3.5–5.0)
Alkaline Phosphatase: 59 U/L (ref 38–126)
Anion gap: 4 — ABNORMAL LOW (ref 5–15)
BUN: 15 mg/dL (ref 8–23)
CO2: 31 mmol/L (ref 22–32)
Calcium: 9.5 mg/dL (ref 8.9–10.3)
Chloride: 104 mmol/L (ref 98–111)
Creatinine: 1.17 mg/dL (ref 0.61–1.24)
GFR, Estimated: >60 mL/min (ref >60)
Glucose, Bld: 115 mg/dL — ABNORMAL HIGH (ref 70–99)
Potassium: 4.3 mmol/L (ref 3.5–5.1)
Sodium: 139 mmol/L (ref 135–145)
Total Bilirubin: 0.8 mg/dL (ref 0.3–1.2)
Total Protein: 6.2 g/dL — ABNORMAL LOW (ref 6.5–8.1)

## 2022-11-07 LAB — FERRITIN: Ferritin: 365 ng/mL — ABNORMAL HIGH (ref 24–336)

## 2022-11-07 LAB — RETICULOCYTES
Immature Retic Fract: 13 % (ref 2.3–15.9)
RBC.: 3.16 MIL/uL — ABNORMAL LOW (ref 4.22–5.81)
Retic Count, Absolute: 43.9 10*3/uL (ref 19.0–186.0)
Retic Ct Pct: 1.4 % (ref 0.4–3.1)

## 2022-11-07 LAB — LACTATE DEHYDROGENASE: LDH: 262 U/L — ABNORMAL HIGH (ref 98–192)

## 2022-11-07 NOTE — Progress Notes (Signed)
Hematology and Oncology Follow Up Visit  Patrick [name] [id number] [birth year] 82 y.o. 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   We last saw him 4 weeks ago.  He is doing pretty well.  He has had no problems since we last saw him.  He has had no bleeding.  He has had no nausea or vomiting.  He is eating well.  He has had no change in bowel or bladder habits.  There is been no problems with bleeding.  The hematoma in his right eye seems to cleared up nicely.  He has had no leg swelling.  There has been no rashes.  Overall, I would say that his performance status is probably ECOG 1.   Medications:  Current Outpatient Medications:    triamcinolone cream (KENALOG) 0.1 %, Apply 1 Application topically., Disp: , Rfl:    amLODipine (NORVASC) 5 MG tablet, TAKE ONE TABLET BY MOUTH ONE TIME DAILY, Disp: 90 tablet, Rfl: 2   amoxicillin (AMOXIL) 500 MG capsule, take 1 capsule by mouth four times a day (Patient not taking: Reported on 09/11/2022), Disp: 20 capsule, Rfl: 3   atorvastatin (LIPITOR) 10 MG tablet, TAKE ONE TABLET BY MOUTH ONE TIME DAILY, Disp: 90 tablet, Rfl: 2   ciprofloxacin (CIPRO) 500 MG tablet, Take 1 tablet (500 mg total) by mouth 2 (two) times daily. (Patient not taking: Reported on 09/11/2022), Disp: 20 tablet, Rfl: 2   diphenoxylate-atropine (LOMOTIL) 2.5-0.025 MG tablet, Take 1 tablet by mouth 4 (four) times daily as needed for diarrhea or loose stools., Disp: 60 tablet, Rfl: 1   HYDROcodone-acetaminophen (NORCO/VICODIN) 5-325 MG tablet, Take 1 tablet by mouth every 6 (six) hours as needed for severe pain. (Patient not taking: Reported on 09/11/2022), Disp: 20 tablet, Rfl: 0   pantoprazole (PROTONIX) 40 MG tablet, TAKE ONE TABLET BY MOUTH ONE TIME DAILY (MUST KEEP 06/24/2022 APPOINTMENT FOR FUTURE REFILLS), Disp: 30 tablet, Rfl:  0   SUMAtriptan (IMITREX) 25 MG tablet, TAKE ONE TABLET BY MOUTH AT ONSET OF HEADACHE. MAY REPEAT DOSE WITH ONE TABLET IN TWO HOURS IF NEEDED. DO NOT EXCEED TWO TABLETS IN 24 HOURS. (Patient not taking: Reported on 09/11/2022), Disp: 12 tablet, Rfl: 0   tadalafil (CIALIS) 5 MG tablet, Take 1 tablet (5 mg total) by mouth daily., Disp: 90 tablet, Rfl: 3   warfarin (JANTOVEN) 7.5 MG tablet, TAKE ONE TO ONE AND A HALF TABLETS BY MOUTH DAILY OR AS DIRECTED BY THE ANTICOAGULATION CLINIC, Disp: 120 tablet, Rfl: 1  Allergies:  Allergies  Allergen Reactions   Codeine Rash   Terbinafine Itching    Past Medical History, Surgical history, Social history, and Family History were reviewed and updated.  Review of Systems: Review of Systems  Constitutional: Negative.   HENT:  Negative.    Eyes: Negative.   Respiratory: Negative.    Cardiovascular: Negative.   Gastrointestinal: Negative.   Endocrine: Negative.   Genitourinary: Negative.    Musculoskeletal: Negative.   Skin: Negative.   Neurological: Negative.   Hematological: Negative.   Psychiatric/Behavioral: Negative.      Physical Exam:  height is 6\' 6"  (1.981 m) and weight is 172 lb (78 kg). His oral temperature is 98.1 F (36.7 C). His blood pressure is 118/59 (abnormal) and his pulse is 66. His respiration is 17 and oxygen saturation is 96%.  Wt Readings from Last 3 Encounters:  11/07/22 172 lb (78 kg)  10/10/22 171 lb 1.3 oz (77.6 kg)  09/11/22 171 lb 12.8 oz (77.9 kg)    Physical Exam Vitals reviewed.  HENT:     Head: Normocephalic and atraumatic.  Eyes:     Pupils: Pupils are equal, round, and reactive to light.     Comments: Ocular exam does show the conjunctival hemorrhage with the right eye.  Pupil reacts appropriately.  He has good extraocular muscle movement.  Cardiovascular:     Rate and Rhythm: Normal rate and regular rhythm.     Heart sounds: Normal heart sounds.     Comments: Cardiac exam shows a regular rate and  rhythm.  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  I hear no murmurs or rubs or bruits. Pulmonary:     Effort: Pulmonary effort is normal.     Breath sounds: Normal breath sounds.  Abdominal:     General: Bowel sounds are normal.     Palpations: Abdomen is soft.  Musculoskeletal:        General: No tenderness or deformity. Normal range of motion.     Cervical back: Normal range of motion.  Lymphadenopathy:     Cervical: No cervical adenopathy.  Skin:    General: Skin is warm and dry.     Findings: No erythema or rash.  Neurological:     Mental Status: He is alert and oriented to person, place, and time.  Psychiatric:        Behavior: Behavior normal.        Thought Content: Thought content normal.        Judgment: Judgment normal.     Lab Results  Component Value Date   WBC 2.3 (L) 11/07/2022   HGB 10.9 (L) 11/07/2022   HCT 31.9 (L) 11/07/2022   MCV 100.3 (H) 11/07/2022   PLT 42 (L) 11/07/2022     Chemistry      Component Value Date/Time   NA 139 11/07/2022 1451   NA 139 11/12/2020 1030   K 4.3 11/07/2022 1451   CL 104 11/07/2022 1451   CO2 31 11/07/2022 1451   BUN 15 11/07/2022 1451   BUN 16 11/12/2020 1030   CREATININE 1.17 11/07/2022 1451      Component Value Date/Time   CALCIUM 9.5 11/07/2022 1451   ALKPHOS 59 11/07/2022 1451   AST 18 11/07/2022 1451   ALT 15 11/07/2022 1451   BILITOT 0.8 11/07/2022 1451       Impression and Plan: Patrick [name] is a 82 year old white male.  He has mild leukopenia and thrombocytopenia.  If I did do a bone marrow biopsy on him.  This confirmed that he has myelodysplasia.  Again, I would classify him as refractory anemia with excess blast-1  (RAEB-1).  I am still willing to wait a little bit longer to treat him.  His white cell count is holding steady.  His platelet count is low but stable.  I looked at his blood smear.  I saw nothing on the blood smear that looked suspicious.  I know that we will  eventually have to treat him.  I would not surprise me if we had to do this before the end of the year.  I am just trying to hold off as long as possible.  I would like to see him back in 4 weeks.  Think that if we do treat him, he is fine have  to go onto Lovenox or heparin and not Coumadin given the difficulties with managing the Coumadin and the platelet fluctuations.  If so, I would have to talk to his cardiologist.    [name], MD 9/13/20244:08 PM

## 2022-11-10 LAB — IRON AND IRON BINDING CAPACITY (CC-WL,HP ONLY)
Iron: 108 ug/dL (ref 45–182)
Saturation Ratios: 38 % (ref 17.9–39.5)
TIBC: 281 ug/dL (ref 250–450)
UIBC: 173 ug/dL (ref 117–376)

## 2022-11-12 ENCOUNTER — Other Ambulatory Visit: Payer: Self-pay | Admitting: Cardiovascular Disease

## 2022-11-12 DIAGNOSIS — Z5181 Encounter for therapeutic drug level monitoring: Secondary | ICD-10-CM

## 2022-11-13 NOTE — Telephone Encounter (Signed)
Warfarin 7.5mg  refill Aortic valve s/p aortic valve replacement  Last INR 10/14/22 Last OV 11/13/21

## 2022-11-22 ENCOUNTER — Other Ambulatory Visit: Payer: Self-pay | Admitting: Cardiovascular Disease
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
## 2022-11-24 ENCOUNTER — Encounter: Payer: Self-pay | Admitting: Cardiovascular Disease

## 2022-11-24 ENCOUNTER — Ambulatory Visit: Payer: Medicare Other | Attending: Cardiovascular Disease | Admitting: Cardiovascular Disease

## 2022-11-24 VITALS — BP 120/70 | HR 53 | Ht 72.0 in | Wt 171.0 lb

## 2022-11-24 DIAGNOSIS — I10 Essential (primary) hypertension: Secondary | ICD-10-CM | POA: Insufficient documentation

## 2022-11-24 DIAGNOSIS — I712 Thoracic aortic aneurysm, without rupture, unspecified: Secondary | ICD-10-CM | POA: Diagnosis not present

## 2022-11-24 DIAGNOSIS — I359 Nonrheumatic aortic valve disorder, unspecified: Secondary | ICD-10-CM | POA: Insufficient documentation

## 2022-11-24 DIAGNOSIS — I714 Abdominal aortic aneurysm, without rupture, unspecified: Secondary | ICD-10-CM | POA: Diagnosis not present

## 2022-11-24 DIAGNOSIS — I440 Atrioventricular block, first degree: Secondary | ICD-10-CM | POA: Diagnosis not present

## 2022-11-24 NOTE — Patient Instructions (Signed)
Medication Instructions:  No changes *If you need a refill on your cardiac medications before your next appointment, please call your pharmacy*   Lab Work: none If you have labs (blood work) drawn today and your tests are completely normal, you will receive your results only by: MyChart Message (if you have MyChart) OR A paper copy in the mail If you have any lab test that is abnormal or we need to change your treatment, we will call you to review the results.   Testing/Procedures: Chest CTA - Aorta   Follow-Up: At [facility], you and your health needs are our priority.  As part of our continuing mission to provide you with exceptional heart care, we have created designated Provider Care Teams.  These Care Teams include your primary Cardiologist (physician) and Advanced Practice Providers (APPs -  Physician Assistants and Nurse Practitioners) who all work together to provide you with the care you need, when you need it.   Your next appointment:   12 month(s)  Provider:   Verne Carrow, MD

## 2022-11-25 ENCOUNTER — Ambulatory Visit: Payer: Medicare Other | Attending: Cardiology

## 2022-11-25 DIAGNOSIS — Z5181 Encounter for therapeutic drug level monitoring: Secondary | ICD-10-CM | POA: Diagnosis not present

## 2022-11-25 DIAGNOSIS — I359 Nonrheumatic aortic valve disorder, unspecified: Secondary | ICD-10-CM | POA: Diagnosis not present

## 2022-11-25 LAB — POCT INR: INR: 2 (ref 2.0–3.0)

## 2022-11-25 NOTE — Patient Instructions (Signed)
continue taking warfarin 1 tablet daily except 1.5 tablets on Fridays.  Recheck INR in 6 weeks. Call with any questions or new medications #[phone] or [phone]

## 2022-12-09 ENCOUNTER — Other Ambulatory Visit: Payer: Self-pay

## 2022-12-09 ENCOUNTER — Inpatient Hospital Stay: Payer: Medicare Other | Attending: Hematology & Oncology

## 2022-12-09 ENCOUNTER — Inpatient Hospital Stay (HOSPITAL_BASED_OUTPATIENT_CLINIC_OR_DEPARTMENT_OTHER): Payer: Medicare Other | Admitting: Hematology & Oncology

## 2022-12-09 ENCOUNTER — Encounter: Payer: Self-pay | Admitting: Hematology & Oncology

## 2022-12-09 VITALS — BP 139/75 | HR 58 | Temp 98.0°F | Resp 18 | Ht 72.0 in | Wt 172.0 lb

## 2022-12-09 DIAGNOSIS — D4621 Refractory anemia with excess of blasts 1: Secondary | ICD-10-CM

## 2022-12-09 DIAGNOSIS — D696 Thrombocytopenia, unspecified: Secondary | ICD-10-CM | POA: Diagnosis not present

## 2022-12-09 DIAGNOSIS — D72819 Decreased white blood cell count, unspecified: Secondary | ICD-10-CM | POA: Diagnosis not present

## 2022-12-09 DIAGNOSIS — D46Z Other myelodysplastic syndromes: Secondary | ICD-10-CM

## 2022-12-09 LAB — CBC WITH DIFFERENTIAL (CANCER CENTER ONLY)
Abs Immature Granulocytes: 0.01 10*3/uL (ref 0.00–0.07)
Basophils Absolute: 0 10*3/uL (ref 0.0–0.1)
Basophils Relative: 0 %
Eosinophils Absolute: 0 10*3/uL (ref 0.0–0.5)
Eosinophils Relative: 1 %
HCT: 34.6 % — ABNORMAL LOW (ref 39.0–52.0)
Hemoglobin: 12 g/dL — ABNORMAL LOW (ref 13.0–17.0)
Immature Granulocytes: 1 %
Lymphocytes Relative: 13 %
Lymphs Abs: 0.3 10*3/uL — ABNORMAL LOW (ref 0.7–4.0)
MCH: 34 pg (ref 26.0–34.0)
MCHC: 34.7 g/dL (ref 30.0–36.0)
MCV: 98 fL (ref 80.0–100.0)
Monocytes Absolute: 0.1 10*3/uL (ref 0.1–1.0)
Monocytes Relative: 7 %
Neutro Abs: 1.7 10*3/uL (ref 1.7–7.7)
Neutrophils Relative %: 78 %
Platelet Count: 42 10*3/uL — ABNORMAL LOW (ref 150–400)
RBC: 3.53 MIL/uL — ABNORMAL LOW (ref 4.22–5.81)
RDW: 16.4 % — ABNORMAL HIGH (ref 11.5–15.5)
WBC Count: 2.2 10*3/uL — ABNORMAL LOW (ref 4.0–10.5)
nRBC: 0 % (ref 0.0–0.2)

## 2022-12-09 LAB — CMP (CANCER CENTER ONLY)
ALT: 17 U/L (ref 0–44)
AST: 18 U/L (ref 15–41)
Albumin: 4.4 g/dL (ref 3.5–5.0)
Alkaline Phosphatase: 67 U/L (ref 38–126)
Anion gap: 6 (ref 5–15)
BUN: 13 mg/dL (ref 8–23)
CO2: 29 mmol/L (ref 22–32)
Calcium: 9.5 mg/dL (ref 8.9–10.3)
Chloride: 106 mmol/L (ref 98–111)
Creatinine: 1.18 mg/dL (ref 0.61–1.24)
GFR, Estimated: >60 mL/min (ref >60)
Glucose, Bld: 116 mg/dL — ABNORMAL HIGH (ref 70–99)
Potassium: 4.6 mmol/L (ref 3.5–5.1)
Sodium: 141 mmol/L (ref 135–145)
Total Bilirubin: 0.8 mg/dL (ref 0.3–1.2)
Total Protein: 6.5 g/dL (ref 6.5–8.1)

## 2022-12-09 LAB — RETICULOCYTES
Immature Retic Fract: 12.1 % (ref 2.3–15.9)
RBC.: 3.45 MIL/uL — ABNORMAL LOW (ref 4.22–5.81)
Retic Count, Absolute: 60.4 10*3/uL (ref 19.0–186.0)
Retic Ct Pct: 1.8 % (ref 0.4–3.1)

## 2022-12-09 LAB — LACTATE DEHYDROGENASE: LDH: 273 U/L — ABNORMAL HIGH (ref 98–192)

## 2022-12-09 LAB — IRON AND IRON BINDING CAPACITY (CC-WL,HP ONLY)
Iron: 172 ug/dL (ref 45–182)
Saturation Ratios: 60 % — ABNORMAL HIGH (ref 17.9–39.5)
TIBC: 286 ug/dL (ref 250–450)
UIBC: 114 ug/dL — ABNORMAL LOW (ref 117–376)

## 2022-12-09 LAB — SAVE SMEAR(SSMR), FOR PROVIDER SLIDE REVIEW

## 2022-12-09 LAB — FERRITIN: Ferritin: 410 ng/mL — ABNORMAL HIGH (ref 24–336)

## 2022-12-09 NOTE — Progress Notes (Signed)
Hematology and Oncology Follow Up Visit  Patrick [name] [id number] 1940/07/30 82 y.o. 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   We last saw him 4 weeks ago.  Since then, he has been doing pretty well.  He really has had no specific complaints.  He has had no problems with bleeding.  He is on Coumadin not having any problems with bleeding.  He has had no fever.  He has had no cough or shortness of breath.  He has had no nausea or vomiting.  Has been no change in bowel or bladder habits.  Overall, I would say that his performance status is still quite good at ECOG 1.   Medications:  Current Outpatient Medications:    amLODipine (NORVASC) 5 MG tablet, TAKE ONE TABLET BY MOUTH ONE TIME DAILY, Disp: 90 tablet, Rfl: 3   amoxicillin (AMOXIL) 500 MG capsule, take 1 capsule by mouth four times a day (Patient not taking: Reported on 09/11/2022), Disp: 20 capsule, Rfl: 3   atorvastatin (LIPITOR) 10 MG tablet, TAKE ONE TABLET BY MOUTH ONE TIME DAILY, Disp: 90 tablet, Rfl: 2   ciprofloxacin (CIPRO) 500 MG tablet, Take 1 tablet (500 mg total) by mouth 2 (two) times daily. (Patient not taking: Reported on 09/11/2022), Disp: 20 tablet, Rfl: 2   diphenoxylate-atropine (LOMOTIL) 2.5-0.025 MG tablet, Take 1 tablet by mouth 4 (four) times daily as needed for diarrhea or loose stools., Disp: 60 tablet, Rfl: 1   pantoprazole (PROTONIX) 40 MG tablet, TAKE ONE TABLET BY MOUTH ONE TIME DAILY (MUST KEEP 06/24/2022 APPOINTMENT FOR FUTURE REFILLS), Disp: 30 tablet, Rfl: 0   SUMAtriptan (IMITREX) 25 MG tablet, TAKE ONE TABLET BY MOUTH AT ONSET OF HEADACHE. MAY REPEAT DOSE WITH ONE TABLET IN TWO HOURS IF NEEDED. DO NOT EXCEED TWO TABLETS IN 24 HOURS., Disp: 12 tablet, Rfl: 0   tadalafil (CIALIS) 5 MG tablet, Take 1 tablet (5 mg total) by mouth daily., Disp: 90 tablet,  Rfl: 3   warfarin (JANTOVEN) 7.5 MG tablet, TAKE ONE TO ONE AND ONE-HALF TABLETS BY MOUTH ONE TIME DAILY OR AS DIRECTED BY THE ANTICOAGULATION CLINIC, Disp: 120 tablet, Rfl: 1  Allergies:  Allergies  Allergen Reactions   Codeine Rash   Terbinafine Itching    Past Medical History, Surgical history, Social history, and Family History were reviewed and updated.  Review of Systems: Review of Systems  Constitutional: Negative.   HENT:  Negative.    Eyes: Negative.   Respiratory: Negative.    Cardiovascular: Negative.   Gastrointestinal: Negative.   Endocrine: Negative.   Genitourinary: Negative.    Musculoskeletal: Negative.   Skin: Negative.   Neurological: Negative.   Hematological: Negative.   Psychiatric/Behavioral: Negative.      Physical Exam:  height is 6' (1.829 m) and weight is 172 lb (78 kg). His oral temperature is 98 F (36.7 C). His blood pressure is 139/75 and his pulse is 58 (abnormal). His respiration is 18 and oxygen saturation is 100%.   Wt Readings from Last 3 Encounters:  12/09/22 172 lb (78 kg)  11/24/22 171 lb (77.6 kg)  11/07/22 172 lb (78 kg)    Physical Exam Vitals reviewed.  HENT:     Head: Normocephalic and atraumatic.  Eyes:     Pupils: Pupils are equal, round, and reactive to light.  Comments: Ocular exam does show the conjunctival hemorrhage with the right eye.  Pupil reacts appropriately.  He has good extraocular muscle movement.  Cardiovascular:     Rate and Rhythm: Normal rate and regular rhythm.     Heart sounds: Normal heart sounds.     Comments: Cardiac exam shows a regular rate and rhythm.  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  I hear no murmurs or rubs or bruits. Pulmonary:     Effort: Pulmonary effort is normal.     Breath sounds: Normal breath sounds.  Abdominal:     General: Bowel sounds are normal.     Palpations: Abdomen is soft.  Musculoskeletal:        General: No tenderness or deformity.  Normal range of motion.     Cervical back: Normal range of motion.  Lymphadenopathy:     Cervical: No cervical adenopathy.  Skin:    General: Skin is warm and dry.     Findings: No erythema or rash.  Neurological:     Mental Status: He is alert and oriented to person, place, and time.  Psychiatric:        Behavior: Behavior normal.        Thought Content: Thought content normal.        Judgment: Judgment normal.      Lab Results  Component Value Date   WBC 2.2 (L) 12/09/2022   HGB 12.0 (L) 12/09/2022   HCT 34.6 (L) 12/09/2022   MCV 98.0 12/09/2022   PLT 42 (L) 12/09/2022     Chemistry      Component Value Date/Time   NA 139 11/07/2022 1451   NA 139 11/12/2020 1030   K 4.3 11/07/2022 1451   CL 104 11/07/2022 1451   CO2 31 11/07/2022 1451   BUN 15 11/07/2022 1451   BUN 16 11/12/2020 1030   CREATININE 1.17 11/07/2022 1451      Component Value Date/Time   CALCIUM 9.5 11/07/2022 1451   ALKPHOS 59 11/07/2022 1451   AST 18 11/07/2022 1451   ALT 15 11/07/2022 1451   BILITOT 0.8 11/07/2022 1451       Impression and Plan: Patrick [name] is a 82 year old white male.  He has mild leukopenia and thrombocytopenia. I did do a bone marrow biopsy on him.  This confirmed that he has myelodysplasia.  Again, I would classify him as refractory anemia with excess blast-1  (RAEB-1).  I am still willing to wait a little bit longer to treat him.  His white cell count is holding steady.  His platelet count is low but stable.  His hemoglobin has improved.  I looked at his blood smear.  I saw nothing on the blood smear that looked suspicious.  I know that we will eventually have to treat him.  I would not surprise me if we had to do this before the end of the year.  I am just trying to hold off as long as possible.  I will try to get him back after Thanksgiving.  Hopefully, we will see that his counts are still holding steady.    [name], MD 10/15/20249:07 AM

## 2022-12-25 DIAGNOSIS — L218 Other seborrheic dermatitis: Secondary | ICD-10-CM | POA: Diagnosis not present

## 2022-12-25 DIAGNOSIS — Z85828 Personal history of other malignant neoplasm of skin: Secondary | ICD-10-CM | POA: Diagnosis not present

## 2022-12-25 DIAGNOSIS — L570 Actinic keratosis: Secondary | ICD-10-CM | POA: Diagnosis not present

## 2022-12-25 DIAGNOSIS — L821 Other seborrheic keratosis: Secondary | ICD-10-CM | POA: Diagnosis not present

## 2022-12-25 DIAGNOSIS — D2271 Melanocytic nevi of right lower limb, including hip: Secondary | ICD-10-CM | POA: Diagnosis not present

## 2022-12-25 DIAGNOSIS — D225 Melanocytic nevi of trunk: Secondary | ICD-10-CM | POA: Diagnosis not present

## 2022-12-25 DIAGNOSIS — L820 Inflamed seborrheic keratosis: Secondary | ICD-10-CM | POA: Diagnosis not present

## 2022-12-25 DIAGNOSIS — D2272 Melanocytic nevi of left lower limb, including hip: Secondary | ICD-10-CM | POA: Diagnosis not present

## 2022-12-25 DIAGNOSIS — L812 Freckles: Secondary | ICD-10-CM | POA: Diagnosis not present

## 2023-01-01 ENCOUNTER — Ambulatory Visit (HOSPITAL_COMMUNITY)
Admission: RE | Admit: 2023-01-01 | Discharge: 2023-01-01 | Disposition: A | Discharge status: Home / Self Care | Payer: Medicare Other | Source: Ambulatory Visit | Attending: Cardiovascular Disease | Admitting: Cardiovascular Disease

## 2023-01-01 DIAGNOSIS — K7689 Other specified diseases of liver: Secondary | ICD-10-CM | POA: Diagnosis not present

## 2023-01-01 DIAGNOSIS — I712 Thoracic aortic aneurysm, without rupture, unspecified: Secondary | ICD-10-CM | POA: Insufficient documentation

## 2023-01-01 DIAGNOSIS — I517 Cardiomegaly: Secondary | ICD-10-CM | POA: Diagnosis not present

## 2023-01-01 DIAGNOSIS — I719 Aortic aneurysm of unspecified site, without rupture: Secondary | ICD-10-CM | POA: Diagnosis not present

## 2023-01-01 DIAGNOSIS — I2510 Atherosclerotic heart disease of native coronary artery without angina pectoris: Secondary | ICD-10-CM | POA: Diagnosis not present

## 2023-01-01 MED ORDER — IOHEXOL 350 MG/ML SOLN
75.0000 mL | Freq: Once | INTRAVENOUS | Status: AC | PRN
  Administered 2023-01-01: 75 mL via INTRAVENOUS

## 2023-01-02 ENCOUNTER — Ambulatory Visit: Payer: Medicare Other

## 2023-01-06 ENCOUNTER — Ambulatory Visit: Payer: Medicare Other | Attending: Cardiology

## 2023-01-06 ENCOUNTER — Ambulatory Visit: Payer: Medicare Other

## 2023-01-06 DIAGNOSIS — Z23 Encounter for immunization: Secondary | ICD-10-CM

## 2023-01-06 DIAGNOSIS — I359 Nonrheumatic aortic valve disorder, unspecified: Secondary | ICD-10-CM | POA: Insufficient documentation

## 2023-01-06 DIAGNOSIS — Z5181 Encounter for therapeutic drug level monitoring: Secondary | ICD-10-CM | POA: Insufficient documentation

## 2023-01-06 LAB — POCT INR: INR: 2.1 (ref 2.0–3.0)

## 2023-01-06 NOTE — Patient Instructions (Signed)
continue taking warfarin 1 tablet daily except 1.5 tablets on Fridays.  Recheck INR in 6 weeks. Call with any questions or new medications #[phone] or [phone]

## 2023-01-06 NOTE — Progress Notes (Signed)
Patient visits today for their high dose flu vaccine. PT was informed of what they were receiving and tolerated injection well. PT was told to reach back out to office if needed.

## 2023-02-16 ENCOUNTER — Ambulatory Visit: Payer: Medicare Other | Attending: Cardiology | Admitting: *Deleted

## 2023-02-16 DIAGNOSIS — Z5181 Encounter for therapeutic drug level monitoring: Secondary | ICD-10-CM | POA: Diagnosis not present

## 2023-02-16 DIAGNOSIS — I359 Nonrheumatic aortic valve disorder, unspecified: Secondary | ICD-10-CM | POA: Insufficient documentation

## 2023-02-16 LAB — POCT INR: INR: 2.7 (ref 2.0–3.0)

## 2023-02-16 NOTE — Patient Instructions (Signed)
Description   Continue taking warfarin 1 tablet daily except 1.5 tablets on Fridays.  Recheck INR in 6 weeks. Call with any questions or new medications #[phone] or [phone]

## 2023-03-05 ENCOUNTER — Encounter: Payer: Self-pay | Admitting: Hematology & Oncology

## 2023-03-11 ENCOUNTER — Other Ambulatory Visit: Payer: Self-pay | Admitting: *Deleted

## 2023-03-11 DIAGNOSIS — Z9889 Other specified postprocedural states: Secondary | ICD-10-CM

## 2023-03-16 ENCOUNTER — Ambulatory Visit (HOSPITAL_COMMUNITY)
Admission: RE | Admit: 2023-03-16 | Discharge: 2023-03-16 | Disposition: A | Discharge status: Home / Self Care | Payer: Medicare Other | Source: Ambulatory Visit | Attending: Surgery | Admitting: Surgery

## 2023-03-16 ENCOUNTER — Encounter: Payer: Self-pay | Admitting: Internal Medicine

## 2023-03-16 ENCOUNTER — Ambulatory Visit (INDEPENDENT_AMBULATORY_CARE_PROVIDER_SITE_OTHER): Payer: Medicare Other | Admitting: Physician Assistant

## 2023-03-16 VITALS — BP 152/81 | HR 50 | Temp 98.2°F | Resp 18 | Ht 72.0 in | Wt 172.1 lb

## 2023-03-16 DIAGNOSIS — Z9889 Other specified postprocedural states: Secondary | ICD-10-CM

## 2023-03-16 DIAGNOSIS — I714 Abdominal aortic aneurysm, without rupture, unspecified: Secondary | ICD-10-CM | POA: Diagnosis not present

## 2023-03-16 NOTE — Progress Notes (Unsigned)
Office Note   History of Present Illness   Patrick [name] is a 83 y.o. ([birth year]) male who presents for follow up of AAA/EVAR. Current Outpatient Medications  Medication Sig Dispense Refill   amLODipine (NORVASC) 5 MG tablet TAKE ONE TABLET BY MOUTH ONE TIME DAILY 90 tablet 3   atorvastatin (LIPITOR) 10 MG tablet TAKE ONE TABLET BY MOUTH ONE TIME DAILY 90 tablet 2   diphenoxylate-atropine (LOMOTIL) 2.5-0.025 MG tablet Take 1 tablet by mouth 4 (four) times daily as needed for diarrhea or loose stools. 60 tablet 1   pantoprazole (PROTONIX) 40 MG tablet TAKE ONE TABLET BY MOUTH ONE TIME DAILY (MUST KEEP 06/24/2022 APPOINTMENT FOR FUTURE REFILLS) 30 tablet 0   SUMAtriptan (IMITREX) 25 MG tablet TAKE ONE TABLET BY MOUTH AT ONSET OF HEADACHE. MAY REPEAT DOSE WITH ONE TABLET IN TWO HOURS IF NEEDED. DO NOT EXCEED TWO TABLETS IN 24 HOURS. 12 tablet 0   tadalafil (CIALIS) 5 MG tablet Take 1 tablet (5 mg total) by mouth daily. 90 tablet 3   warfarin (JANTOVEN) 7.5 MG tablet TAKE ONE TO ONE AND ONE-HALF TABLETS BY MOUTH ONE TIME DAILY OR AS DIRECTED BY THE ANTICOAGULATION CLINIC 120 tablet 1   amoxicillin (AMOXIL) 500 MG capsule take 1 capsule by mouth four times a day (Patient not taking: Reported on 03/16/2023) 20 capsule 3   ciprofloxacin (CIPRO) 500 MG tablet Take 1 tablet (500 mg total) by mouth 2 (two) times daily. (Patient not taking: Reported on 03/16/2023) 20 tablet 2   No current facility-administered medications for this visit.    ***REVIEW OF SYSTEMS (negative unless checked):   Cardiac:  []  Chest pain or chest pressure? []  Shortness of breath upon activity? []  Shortness of breath when lying flat? []  Irregular heart rhythm?  Vascular:  []  Pain in calf, thigh, or hip brought on by walking? []  Pain in feet at night that wakes you up from your sleep? []  Blood clot in your veins? []  Leg swelling?  Pulmonary:  []  Oxygen at home? []  Productive cough? []  Wheezing?  Neurologic:   []  Sudden weakness in arms or legs? []  Sudden numbness in arms or legs? []  Sudden onset of difficult speaking or slurred speech? []  Temporary loss of vision in one eye? []  Problems with dizziness?  Gastrointestinal:  []  Blood in stool? []  Vomited blood?  Genitourinary:  []  Burning when urinating? []  Blood in urine?  Psychiatric:  []  Major depression  Hematologic:  []  Bleeding problems? []  Problems with blood clotting?  Dermatologic:  []  Rashes or ulcers?  Constitutional:  []  Fever or chills?  Ear/Nose/Throat:  []  Change in hearing? []  Nose bleeds? []  Sore throat?  Musculoskeletal:  []  Back pain? []  Joint pain? []  Muscle pain?   Physical Examination  *** Vitals:   03/16/23 1049  BP: (!) 152/81  Pulse: (!) 50  Resp: 18  Temp: 98.2 F (36.8 C)  TempSrc: Temporal  SpO2: 95%  Weight: 172 lb 1.6 oz (78.1 kg)  Height: 6' (1.829 m)   ***Body mass index is 23.34 kg/m.  General:  WDWN in NAD; vital signs documented above Gait: Not observed HENT: WNL, normocephalic Pulmonary: normal non-labored breathing , without rales, rhonchi,  wheezing Cardiac: {Desc; regular/irreg:14544} HR, without murmurs {With/Without:20273} carotid bruit*** Abdomen: soft, NT, no masses Skin: {With/Without:20273} rashes Vascular Exam/Pulses: *** Extremities: {With/Without:20273} ischemic changes, {With/Without:20273} gangrene , {With/Without:20273} cellulitis; {With/Without:20273} open wounds;  Musculoskeletal: no muscle wasting or atrophy  Neurologic: A&O X 3;  No focal weakness  or paresthesias are detected Psychiatric:  The pt has {Desc; normal/abnormal:11317::"Normal"} affect.   Non-Invasive Vascular Imaging   AAA Duplex (***) Current size: *** cm Previous size: *** cm (***) R CIA: *** cm L CIA: *** cm   Medical Decision Making   Patrick [name] is a 83 y.o. ([birth year]) male who presents for surveillance of AAA  Based on this patient's duplex, the *** The threshold  for repair is AAA size > 5.5 cm, growth > 1 cm/yr, and symptomatic status. The patient will continue to take their *** and follow up with our office in *** months/years with AAA duplex   Patrick Dubonnet PA-C [facility] Office: [phone]  Clinic MD: ***

## 2023-03-20 ENCOUNTER — Inpatient Hospital Stay: Payer: Medicare Other | Attending: Hematology & Oncology

## 2023-03-20 ENCOUNTER — Encounter: Payer: Self-pay | Admitting: Hematology & Oncology

## 2023-03-20 ENCOUNTER — Inpatient Hospital Stay (HOSPITAL_BASED_OUTPATIENT_CLINIC_OR_DEPARTMENT_OTHER): Payer: Medicare Other | Admitting: Hematology & Oncology

## 2023-03-20 VITALS — BP 136/55 | HR 55 | Temp 97.5°F | Resp 18 | Ht 72.0 in | Wt 177.0 lb

## 2023-03-20 DIAGNOSIS — D4620 Refractory anemia with excess of blasts, unspecified: Secondary | ICD-10-CM | POA: Diagnosis not present

## 2023-03-20 DIAGNOSIS — D46Z Other myelodysplastic syndromes: Secondary | ICD-10-CM

## 2023-03-20 DIAGNOSIS — D4621 Refractory anemia with excess of blasts 1: Secondary | ICD-10-CM

## 2023-03-20 DIAGNOSIS — Z7901 Long term (current) use of anticoagulants: Secondary | ICD-10-CM | POA: Insufficient documentation

## 2023-03-20 LAB — CMP (CANCER CENTER ONLY)
ALT: 17 U/L (ref 0–44)
AST: 16 U/L (ref 15–41)
Albumin: 4.3 g/dL (ref 3.5–5.0)
Alkaline Phosphatase: 72 U/L (ref 38–126)
Anion gap: 5 (ref 5–15)
BUN: 15 mg/dL (ref 8–23)
CO2: 28 mmol/L (ref 22–32)
Calcium: 9.3 mg/dL (ref 8.9–10.3)
Chloride: 108 mmol/L (ref 98–111)
Creatinine: 1.15 mg/dL (ref 0.61–1.24)
GFR, Estimated: >60 mL/min (ref >60)
Glucose, Bld: 185 mg/dL — ABNORMAL HIGH (ref 70–99)
Potassium: 5 mmol/L (ref 3.5–5.1)
Sodium: 141 mmol/L (ref 135–145)
Total Bilirubin: 0.8 mg/dL (ref 0.0–1.2)
Total Protein: 5.9 g/dL — ABNORMAL LOW (ref 6.5–8.1)

## 2023-03-20 LAB — CBC WITH DIFFERENTIAL (CANCER CENTER ONLY)
Abs Immature Granulocytes: 0.02 10*3/uL (ref 0.00–0.07)
Basophils Absolute: 0 10*3/uL (ref 0.0–0.1)
Basophils Relative: 0 %
Eosinophils Absolute: 0 10*3/uL (ref 0.0–0.5)
Eosinophils Relative: 1 %
HCT: 32.3 % — ABNORMAL LOW (ref 39.0–52.0)
Hemoglobin: 11 g/dL — ABNORMAL LOW (ref 13.0–17.0)
Immature Granulocytes: 1 %
Lymphocytes Relative: 15 %
Lymphs Abs: 0.3 10*3/uL — ABNORMAL LOW (ref 0.7–4.0)
MCH: 33.8 pg (ref 26.0–34.0)
MCHC: 34.1 g/dL (ref 30.0–36.0)
MCV: 99.4 fL (ref 80.0–100.0)
Monocytes Absolute: 0.1 10*3/uL (ref 0.1–1.0)
Monocytes Relative: 7 %
Neutro Abs: 1.6 10*3/uL — ABNORMAL LOW (ref 1.7–7.7)
Neutrophils Relative %: 76 %
Platelet Count: 42 10*3/uL — ABNORMAL LOW (ref 150–400)
RBC: 3.25 MIL/uL — ABNORMAL LOW (ref 4.22–5.81)
RDW: 16.9 % — ABNORMAL HIGH (ref 11.5–15.5)
WBC Count: 2.1 10*3/uL — ABNORMAL LOW (ref 4.0–10.5)
nRBC: 0 % (ref 0.0–0.2)

## 2023-03-20 LAB — RETICULOCYTES
Immature Retic Fract: 12.6 % (ref 2.3–15.9)
RBC.: 3.21 MIL/uL — ABNORMAL LOW (ref 4.22–5.81)
Retic Count, Absolute: 52 10*3/uL (ref 19.0–186.0)
Retic Ct Pct: 1.6 % (ref 0.4–3.1)

## 2023-03-20 LAB — IRON AND IRON BINDING CAPACITY (CC-WL,HP ONLY)
Iron: 135 ug/dL (ref 45–182)
Saturation Ratios: 50 % — ABNORMAL HIGH (ref 17.9–39.5)
TIBC: 270 ug/dL (ref 250–450)
UIBC: 135 ug/dL (ref 117–376)

## 2023-03-20 LAB — FERRITIN: Ferritin: 403 ng/mL — ABNORMAL HIGH (ref 24–336)

## 2023-03-20 LAB — LACTATE DEHYDROGENASE: LDH: 260 U/L — ABNORMAL HIGH (ref 98–192)

## 2023-03-20 NOTE — Progress Notes (Signed)
Hematology and Oncology Follow Up Visit  Patrick [name] [id number] May 22, 1940 83 y.o. 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   He made it through the holidays.  He is doing quite well.  He really has no complaints.  The only problem is that he and his wife lost their beloved dog over the holidays.  I know this has been tough on him.  He would be going down to [place] I think in late February for convention.  I am sure he will have a wonderful time.  He told me that a half-brother of his also was diagnosed with myelodysplasia.  I think he lives down in [place].  There is been no bleeding.  He is on Coumadin.  He has had no complications from the Coumadin.  He has had no fever.  He has had no cough.  There is been no COVID issues.  Has had no change in bowel or bladder habits.  Overall, I would say his performance status is probably ECOG 1.    Medications:  Current Outpatient Medications:    amLODipine (NORVASC) 5 MG tablet, TAKE ONE TABLET BY MOUTH ONE TIME DAILY, Disp: 90 tablet, Rfl: 3   atorvastatin (LIPITOR) 10 MG tablet, TAKE ONE TABLET BY MOUTH ONE TIME DAILY, Disp: 90 tablet, Rfl: 2   diphenoxylate-atropine (LOMOTIL) 2.5-0.025 MG tablet, Take 1 tablet by mouth 4 (four) times daily as needed for diarrhea or loose stools., Disp: 60 tablet, Rfl: 1   pantoprazole (PROTONIX) 40 MG tablet, TAKE ONE TABLET BY MOUTH ONE TIME DAILY (MUST KEEP 06/24/2022 APPOINTMENT FOR FUTURE REFILLS), Disp: 30 tablet, Rfl: 0   SUMAtriptan (IMITREX) 25 MG tablet, TAKE ONE TABLET BY MOUTH AT ONSET OF HEADACHE. MAY REPEAT DOSE WITH ONE TABLET IN TWO HOURS IF NEEDED. DO NOT EXCEED TWO TABLETS IN 24 HOURS., Disp: 12 tablet, Rfl: 0   tadalafil (CIALIS) 5 MG tablet, Take 1 tablet (5 mg total) by mouth daily., Disp: 90 tablet, Rfl: 3   warfarin (JANTOVEN)  7.5 MG tablet, TAKE ONE TO ONE AND ONE-HALF TABLETS BY MOUTH ONE TIME DAILY OR AS DIRECTED BY THE ANTICOAGULATION CLINIC, Disp: 120 tablet, Rfl: 1   amoxicillin (AMOXIL) 500 MG capsule, take 1 capsule by mouth four times a day (Patient not taking: Reported on 09/11/2022), Disp: 20 capsule, Rfl: 3   ciprofloxacin (CIPRO) 500 MG tablet, Take 1 tablet (500 mg total) by mouth 2 (two) times daily. (Patient not taking: Reported on 09/11/2022), Disp: 20 tablet, Rfl: 2  Allergies:  Allergies  Allergen Reactions   Codeine Rash   Terbinafine Itching    Past Medical History, Surgical history, Social history, and Family History were reviewed and updated.  Review of Systems: Review of Systems  Constitutional: Negative.   HENT:  Negative.    Eyes: Negative.   Respiratory: Negative.    Cardiovascular: Negative.   Gastrointestinal: Negative.   Endocrine: Negative.   Genitourinary: Negative.    Musculoskeletal: Negative.   Skin: Negative.   Neurological: Negative.   Hematological: Negative.   Psychiatric/Behavioral: Negative.      Physical Exam:  height is 6' (1.829 m) and weight is 177 lb (80.3 kg). His oral temperature is 97.5 F (36.4 C) (abnormal). His blood pressure is 136/55 (abnormal) and his pulse is 55 (abnormal). His respiration is 18 and oxygen saturation is 100%.  Wt Readings from Last 3 Encounters:  03/20/23 177 lb (80.3 kg)  03/16/23 172 lb 1.6 oz (78.1 kg)  12/09/22 172 lb (78 kg)    Physical Exam Vitals reviewed.  HENT:     Head: Normocephalic and atraumatic.  Eyes:     Pupils: Pupils are equal, round, and reactive to light.     Comments: Ocular exam does show the conjunctival hemorrhage with the right eye.  Pupil reacts appropriately.  He has good extraocular muscle movement.  Cardiovascular:     Rate and Rhythm: Normal rate and regular rhythm.     Heart sounds: Normal heart sounds.     Comments: Cardiac exam shows a regular rate and rhythm.  He does have a systolic  click from his mechanical cardiac valve.  I hear no murmurs or rubs or bruits. Pulmonary:     Effort: Pulmonary effort is normal.     Breath sounds: Normal breath sounds.  Abdominal:     General: Bowel sounds are normal.     Palpations: Abdomen is soft.  Musculoskeletal:        General: No tenderness or deformity. Normal range of motion.     Cervical back: Normal range of motion.  Lymphadenopathy:     Cervical: No cervical adenopathy.  Skin:    General: Skin is warm and dry.     Findings: No erythema or rash.  Neurological:     Mental Status: He is alert and oriented to person, place, and time.  Psychiatric:        Behavior: Behavior normal.        Thought Content: Thought content normal.        Judgment: Judgment normal.     Lab Results  Component Value Date   WBC 2.1 (L) 03/20/2023   HGB 11.0 (L) 03/20/2023   HCT 32.3 (L) 03/20/2023   MCV 99.4 03/20/2023   PLT 42 (L) 03/20/2023     Chemistry      Component Value Date/Time   NA 141 03/20/2023 0937   NA 139 11/12/2020 1030   K 5.0 03/20/2023 0937   CL 108 03/20/2023 0937   CO2 28 03/20/2023 0937   BUN 15 03/20/2023 0937   BUN 16 11/12/2020 1030   CREATININE 1.15 03/20/2023 0937      Component Value Date/Time   CALCIUM 9.3 03/20/2023 0937   ALKPHOS 72 03/20/2023 0937   AST 16 03/20/2023 0937   ALT 17 03/20/2023 0937   BILITOT 0.8 03/20/2023 0937       Impression and Plan: Patrick [name] is a 83 year old white male.  He has mild leukopenia and thrombocytopenia. I did do a bone marrow biopsy on him.  This confirmed that he has myelodysplasia.  Again, I would classify him as refractory anemia with excess blast-1  (RAEB-1).  I am still willing to wait a little bit longer to treat him.  His white cell count is trending down just slightly.  However, his neutrophils are still adequate from my point of view.  I would like to see him back probably in early March.  Again, we will certainly know when to treat him.  For  right now, I still think we can watch him since he is not symptomatic.    [name], MD 1/24/202511:07 AM

## 2023-03-24 DIAGNOSIS — I788 Other diseases of capillaries: Secondary | ICD-10-CM | POA: Diagnosis not present

## 2023-03-24 DIAGNOSIS — L738 Other specified follicular disorders: Secondary | ICD-10-CM | POA: Diagnosis not present

## 2023-03-24 DIAGNOSIS — L820 Inflamed seborrheic keratosis: Secondary | ICD-10-CM | POA: Diagnosis not present

## 2023-03-24 DIAGNOSIS — Z85828 Personal history of other malignant neoplasm of skin: Secondary | ICD-10-CM | POA: Diagnosis not present

## 2023-03-24 DIAGNOSIS — L718 Other rosacea: Secondary | ICD-10-CM | POA: Diagnosis not present

## 2023-03-24 DIAGNOSIS — L570 Actinic keratosis: Secondary | ICD-10-CM | POA: Diagnosis not present

## 2023-03-26 ENCOUNTER — Encounter: Payer: Self-pay | Admitting: Internal Medicine

## 2023-03-26 ENCOUNTER — Telehealth: Payer: Medicare Other

## 2023-03-26 NOTE — Progress Notes (Signed)
[place] he thought this was a medicare annual wellness visit.  Advised to call his PCP office.  Patient acknowledged agreement and understanding of the plan.

## 2023-03-30 ENCOUNTER — Ambulatory Visit: Payer: Medicare Other | Attending: Cardiology

## 2023-03-30 DIAGNOSIS — Z5181 Encounter for therapeutic drug level monitoring: Secondary | ICD-10-CM | POA: Diagnosis not present

## 2023-03-30 DIAGNOSIS — I359 Nonrheumatic aortic valve disorder, unspecified: Secondary | ICD-10-CM

## 2023-03-30 LAB — POCT INR: INR: 2 (ref 2.0–3.0)

## 2023-03-30 NOTE — Patient Instructions (Signed)
Continue taking warfarin 1 tablet daily except 1.5 tablets on Fridays.  Recheck INR in 6 weeks. Call with any questions or new medications #[phone] or [phone]

## 2023-03-31 ENCOUNTER — Encounter: Payer: Self-pay | Admitting: Hematology & Oncology

## 2023-04-16 ENCOUNTER — Encounter: Payer: Self-pay | Admitting: Internal Medicine

## 2023-04-16 ENCOUNTER — Encounter: Payer: Self-pay | Admitting: Family Medicine

## 2023-04-17 ENCOUNTER — Other Ambulatory Visit: Payer: Self-pay

## 2023-04-17 MED ORDER — DIPHENOXYLATE-ATROPINE 2.5-0.025 MG PO TABS: 1.0000 | ORAL_TABLET | Freq: Four times a day (QID) | ORAL | 1 refills | Status: AC | PRN

## 2023-04-27 DIAGNOSIS — Z961 Presence of intraocular lens: Secondary | ICD-10-CM | POA: Diagnosis not present

## 2023-04-27 DIAGNOSIS — H524 Presbyopia: Secondary | ICD-10-CM | POA: Diagnosis not present

## 2023-04-27 DIAGNOSIS — H35371 Puckering of macula, right eye: Secondary | ICD-10-CM | POA: Diagnosis not present

## 2023-04-30 ENCOUNTER — Encounter: Payer: Self-pay | Admitting: Hematology & Oncology

## 2023-04-30 ENCOUNTER — Inpatient Hospital Stay: Payer: Medicare Other | Admitting: Hematology & Oncology

## 2023-04-30 ENCOUNTER — Inpatient Hospital Stay: Payer: Medicare Other | Attending: Hematology & Oncology

## 2023-04-30 VITALS — BP 140/64 | HR 55 | Temp 98.1°F | Resp 20 | Ht 72.0 in | Wt 174.4 lb

## 2023-04-30 DIAGNOSIS — D696 Thrombocytopenia, unspecified: Secondary | ICD-10-CM

## 2023-04-30 DIAGNOSIS — D708 Other neutropenia: Secondary | ICD-10-CM

## 2023-04-30 DIAGNOSIS — M722 Plantar fascial fibromatosis: Secondary | ICD-10-CM | POA: Diagnosis not present

## 2023-04-30 DIAGNOSIS — D46Z Other myelodysplastic syndromes: Secondary | ICD-10-CM

## 2023-04-30 DIAGNOSIS — D4621 Refractory anemia with excess of blasts 1: Secondary | ICD-10-CM | POA: Diagnosis not present

## 2023-04-30 LAB — CBC WITH DIFFERENTIAL (CANCER CENTER ONLY)
Abs Immature Granulocytes: 0.04 10*3/uL (ref 0.00–0.07)
Basophils Absolute: 0 10*3/uL (ref 0.0–0.1)
Basophils Relative: 0 %
Eosinophils Absolute: 0 10*3/uL (ref 0.0–0.5)
Eosinophils Relative: 0 %
HCT: 31 % — ABNORMAL LOW (ref 39.0–52.0)
Hemoglobin: 10.7 g/dL — ABNORMAL LOW (ref 13.0–17.0)
Immature Granulocytes: 2 %
Lymphocytes Relative: 15 %
Lymphs Abs: 0.3 10*3/uL — ABNORMAL LOW (ref 0.7–4.0)
MCH: 34.2 pg — ABNORMAL HIGH (ref 26.0–34.0)
MCHC: 34.5 g/dL (ref 30.0–36.0)
MCV: 99 fL (ref 80.0–100.0)
Monocytes Absolute: 0.1 10*3/uL (ref 0.1–1.0)
Monocytes Relative: 7 %
Neutro Abs: 1.5 10*3/uL — ABNORMAL LOW (ref 1.7–7.7)
Neutrophils Relative %: 76 %
Platelet Count: 40 10*3/uL — ABNORMAL LOW (ref 150–400)
RBC: 3.13 MIL/uL — ABNORMAL LOW (ref 4.22–5.81)
RDW: 17.2 % — ABNORMAL HIGH (ref 11.5–15.5)
WBC Count: 2 10*3/uL — ABNORMAL LOW (ref 4.0–10.5)
nRBC: 0 % (ref 0.0–0.2)

## 2023-04-30 LAB — RETICULOCYTES
Immature Retic Fract: 13 % (ref 2.3–15.9)
RBC.: 3.16 MIL/uL — ABNORMAL LOW (ref 4.22–5.81)
Retic Count, Absolute: 54.4 10*3/uL (ref 19.0–186.0)
Retic Ct Pct: 1.7 % (ref 0.4–3.1)

## 2023-04-30 LAB — CMP (CANCER CENTER ONLY)
ALT: 14 U/L (ref 0–44)
AST: 15 U/L (ref 15–41)
Albumin: 4.4 g/dL (ref 3.5–5.0)
Alkaline Phosphatase: 73 U/L (ref 38–126)
Anion gap: 4 — ABNORMAL LOW (ref 5–15)
BUN: 17 mg/dL (ref 8–23)
CO2: 29 mmol/L (ref 22–32)
Calcium: 9.5 mg/dL (ref 8.9–10.3)
Chloride: 106 mmol/L (ref 98–111)
Creatinine: 1.01 mg/dL (ref 0.61–1.24)
GFR, Estimated: >60 mL/min (ref >60)
Glucose, Bld: 80 mg/dL (ref 70–99)
Potassium: 4.4 mmol/L (ref 3.5–5.1)
Sodium: 139 mmol/L (ref 135–145)
Total Bilirubin: 0.6 mg/dL (ref 0.0–1.2)
Total Protein: 6.3 g/dL — ABNORMAL LOW (ref 6.5–8.1)

## 2023-04-30 LAB — LACTATE DEHYDROGENASE: LDH: 261 U/L — ABNORMAL HIGH (ref 98–192)

## 2023-04-30 LAB — SAVE SMEAR(SSMR), FOR PROVIDER SLIDE REVIEW

## 2023-04-30 NOTE — Progress Notes (Signed)
 Hematology and Oncology Follow Up Visit  Patrick [name] [id number] [birth year] 83 y.o. 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  He comes in with his wife.  Unfortunately, she has developed plantar fasciitis.  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  He feels okay.  He really has not had any obvious complaints.  He has had no bleeding.  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  He has had no problems eating.  He is trying to watch his blood sugars.  He is trying to watch his caloric intake.  He has had no rashes.  He has had no leg swelling.  He has had no nausea or vomiting.  Currently, I would say that his performance status is probably ECOG 1.    Medications:  Current Outpatient Medications:    amLODipine (NORVASC) 5 MG tablet, TAKE ONE TABLET BY MOUTH ONE TIME DAILY, Disp: 90 tablet, Rfl: 3   atorvastatin (LIPITOR) 10 MG tablet, TAKE ONE TABLET BY MOUTH ONE TIME DAILY, Disp: 90 tablet, Rfl: 2   diphenoxylate-atropine (LOMOTIL) 2.5-0.025 MG tablet, Take 1 tablet by mouth 4 (four) times daily as needed for diarrhea or loose stools., Disp: 60 tablet, Rfl: 1   hydrocortisone 2.5 % cream, Apply 1 Application topically daily as needed., Disp: , Rfl:    pantoprazole (PROTONIX) 40 MG tablet, TAKE ONE TABLET BY MOUTH ONE TIME DAILY (MUST KEEP 06/24/2022 APPOINTMENT FOR FUTURE REFILLS), Disp: 30 tablet, Rfl: 0   tadalafil (CIALIS) 5 MG tablet, Take 1 tablet (5 mg total) by mouth daily., Disp: 90 tablet, Rfl: 3   warfarin (JANTOVEN) 7.5 MG tablet, TAKE ONE TO ONE AND ONE-HALF TABLETS BY MOUTH ONE TIME DAILY OR AS DIRECTED BY THE ANTICOAGULATION CLINIC, Disp: 120 tablet, Rfl: 1   amoxicillin (AMOXIL) 500 MG capsule, take 1 capsule by mouth four times a day (Patient not taking: Reported on  04/30/2023), Disp: 20 capsule, Rfl: 3   ciprofloxacin (CIPRO) 500 MG tablet, Take 1 tablet (500 mg total) by mouth 2 (two) times daily. (Patient not taking: Reported on 04/30/2023), Disp: 20 tablet, Rfl: 2   SUMAtriptan (IMITREX) 25 MG tablet, TAKE ONE TABLET BY MOUTH AT ONSET OF HEADACHE. MAY REPEAT DOSE WITH ONE TABLET IN TWO HOURS IF NEEDED. DO NOT EXCEED TWO TABLETS IN 24 HOURS. (Patient not taking: Reported on 04/30/2023), Disp: 12 tablet, Rfl: 0  Allergies:  Allergies  Allergen Reactions   Codeine Rash   Terbinafine Itching    Past Medical History, Surgical history, Social history, and Family History were reviewed and updated.  Review of Systems: Review of Systems  Constitutional: Negative.   HENT:  Negative.    Eyes: Negative.   Respiratory: Negative.    Cardiovascular: Negative.   Gastrointestinal: Negative.   Endocrine: Negative.   Genitourinary: Negative.    Musculoskeletal: Negative.   Skin: Negative.   Neurological: Negative.   Hematological: Negative.   Psychiatric/Behavioral: Negative.      Physical Exam:  height is 6' (1.829 m) and weight is 79.1 kg. His oral temperature is 98.1 F (36.7 C). His blood pressure is 140/64 (abnormal) and his pulse is 55 (abnormal). His respiration is 20 and oxygen saturation is 99%.   Wt Readings from Last 3 Encounters:  04/30/23 79.1 kg  03/20/23 80.3 kg  03/16/23 78.1 kg    Physical Exam Vitals reviewed.  HENT:     Head: Normocephalic and atraumatic.  Eyes:     Pupils: Pupils are equal, round, and reactive to light.     Comments: Ocular exam does show the conjunctival hemorrhage with the right eye.  Pupil reacts appropriately.  He has good extraocular muscle movement.  Cardiovascular:     Rate and Rhythm: Normal rate and regular rhythm.     Heart sounds: Normal heart sounds.     Comments: Cardiac exam shows a regular rate and rhythm.  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  I hear no murmurs or rubs  or bruits. Pulmonary:     Effort: Pulmonary effort is normal.     Breath sounds: Normal breath sounds.  Abdominal:     General: Bowel sounds are normal.     Palpations: Abdomen is soft.  Musculoskeletal:        General: No tenderness or deformity. Normal range of motion.     Cervical back: Normal range of motion.  Lymphadenopathy:     Cervical: No cervical adenopathy.  Skin:    General: Skin is warm and dry.     Findings: No erythema or rash.  Neurological:     Mental Status: He is alert and oriented to person, place, and time.  Psychiatric:        Behavior: Behavior normal.        Thought Content: Thought content normal.        Judgment: Judgment normal.     Lab Results  Component Value Date   WBC 2.0 (L) 04/30/2023   HGB 10.7 (L) 04/30/2023   HCT 31.0 (L) 04/30/2023   MCV 99.0 04/30/2023   PLT 40 (L) 04/30/2023     Chemistry      Component Value Date/Time   NA 139 04/30/2023 1444   NA 139 11/12/2020 1030   K 4.4 04/30/2023 1444   CL 106 04/30/2023 1444   CO2 29 04/30/2023 1444   BUN 17 04/30/2023 1444   BUN 16 11/12/2020 1030   CREATININE 1.01 04/30/2023 1444      Component Value Date/Time   CALCIUM 9.5 04/30/2023 1444   ALKPHOS 73 04/30/2023 1444   AST 15 04/30/2023 1444   ALT 14 04/30/2023 1444   BILITOT 0.6 04/30/2023 1444       Impression and Plan: Patrick [name] is a 83 year old white male.  He has mild leukopenia and thrombocytopenia. I did do a bone marrow biopsy on him.  This confirmed that he has myelodysplasia.  Again, I would classify him as refractory anemia with excess blast-1  (RAEB-1).  Again, the trend clearly is downward for his white cells.  We are at the point now where we are going to have to really think about treating him.  He has a a lot going on with business right now.  I do think that we still have the "flexibility" waiting.  I would like to see him back in about 6 weeks.  At that point, I think we will then make a final decision as  to whether or not we treat him.  If we treat him, I would give him Vidaza/venetoclax.     [name], MD 3/6/20253:33 PM

## 2023-05-11 ENCOUNTER — Ambulatory Visit: Payer: Medicare Other | Attending: Cardiology

## 2023-05-11 ENCOUNTER — Ambulatory Visit: Payer: Medicare Other

## 2023-05-11 DIAGNOSIS — Z5181 Encounter for therapeutic drug level monitoring: Secondary | ICD-10-CM | POA: Diagnosis not present

## 2023-05-11 DIAGNOSIS — I359 Nonrheumatic aortic valve disorder, unspecified: Secondary | ICD-10-CM | POA: Diagnosis not present

## 2023-05-11 LAB — POCT INR: INR: 1.7 — AB (ref 2.0–3.0)

## 2023-05-11 NOTE — Patient Instructions (Signed)
 Take 2 tablets today only then Continue taking warfarin 1 tablet daily except 1.5 tablets on Fridays.  Recheck INR in 4 weeks. Call with any questions or new medications #[phone] or [phone]

## 2023-05-17 ENCOUNTER — Other Ambulatory Visit: Payer: Self-pay | Admitting: Cardiovascular Disease

## 2023-05-17 DIAGNOSIS — Z5181 Encounter for therapeutic drug level monitoring: Secondary | ICD-10-CM

## 2023-05-18 NOTE — Telephone Encounter (Signed)
 Refill request for Jantoven:  Last INR was 1.7 on 05/11/23 Next INR due 06/08/23 LOV was 11/24/22  Refill approved.

## 2023-06-08 ENCOUNTER — Ambulatory Visit: Attending: Cardiology

## 2023-06-08 DIAGNOSIS — I359 Nonrheumatic aortic valve disorder, unspecified: Secondary | ICD-10-CM

## 2023-06-08 DIAGNOSIS — Z5181 Encounter for therapeutic drug level monitoring: Secondary | ICD-10-CM | POA: Diagnosis not present

## 2023-06-08 LAB — POCT INR: INR: 1.6 — AB (ref 2.0–3.0)

## 2023-06-08 NOTE — Patient Instructions (Signed)
 INCREASE to 1 tablet daily except 1.5 tablets on Mondays and Fridays.  Recheck INR in 4 weeks. Call with any questions or new medications #[phone] or [phone]

## 2023-06-09 ENCOUNTER — Other Ambulatory Visit: Payer: Self-pay | Admitting: Internal Medicine

## 2023-06-11 ENCOUNTER — Inpatient Hospital Stay (HOSPITAL_BASED_OUTPATIENT_CLINIC_OR_DEPARTMENT_OTHER): Admitting: Hematology & Oncology

## 2023-06-11 ENCOUNTER — Telehealth: Payer: Self-pay | Admitting: Cardiovascular Disease

## 2023-06-11 ENCOUNTER — Encounter: Payer: Self-pay | Admitting: Hematology & Oncology

## 2023-06-11 ENCOUNTER — Inpatient Hospital Stay: Attending: Hematology & Oncology

## 2023-06-11 VITALS — BP 127/72 | HR 55 | Temp 97.9°F | Resp 18 | Ht 72.0 in | Wt 172.4 lb

## 2023-06-11 DIAGNOSIS — D4621 Refractory anemia with excess of blasts 1: Secondary | ICD-10-CM | POA: Insufficient documentation

## 2023-06-11 DIAGNOSIS — D46Z Other myelodysplastic syndromes: Secondary | ICD-10-CM

## 2023-06-11 DIAGNOSIS — Z952 Presence of prosthetic heart valve: Secondary | ICD-10-CM | POA: Insufficient documentation

## 2023-06-11 DIAGNOSIS — Z7901 Long term (current) use of anticoagulants: Secondary | ICD-10-CM | POA: Insufficient documentation

## 2023-06-11 DIAGNOSIS — D696 Thrombocytopenia, unspecified: Secondary | ICD-10-CM

## 2023-06-11 DIAGNOSIS — D708 Other neutropenia: Secondary | ICD-10-CM

## 2023-06-11 LAB — CMP (CANCER CENTER ONLY)
ALT: 14 U/L (ref 0–44)
AST: 16 U/L (ref 15–41)
Albumin: 4.4 g/dL (ref 3.5–5.0)
Alkaline Phosphatase: 66 U/L (ref 38–126)
Anion gap: 6 (ref 5–15)
BUN: 17 mg/dL (ref 8–23)
CO2: 28 mmol/L (ref 22–32)
Calcium: 9.7 mg/dL (ref 8.9–10.3)
Chloride: 107 mmol/L (ref 98–111)
Creatinine: 1.19 mg/dL (ref 0.61–1.24)
GFR, Estimated: >60 mL/min (ref >60)
Glucose, Bld: 148 mg/dL — ABNORMAL HIGH (ref 70–99)
Potassium: 5.1 mmol/L (ref 3.5–5.1)
Sodium: 141 mmol/L (ref 135–145)
Total Bilirubin: 0.7 mg/dL (ref 0.0–1.2)
Total Protein: 6.2 g/dL — ABNORMAL LOW (ref 6.5–8.1)

## 2023-06-11 LAB — CBC WITH DIFFERENTIAL (CANCER CENTER ONLY)
Abs Immature Granulocytes: 0.03 10*3/uL (ref 0.00–0.07)
Basophils Absolute: 0 10*3/uL (ref 0.0–0.1)
Basophils Relative: 0 %
Eosinophils Absolute: 0 10*3/uL (ref 0.0–0.5)
Eosinophils Relative: 1 %
HCT: 32.2 % — ABNORMAL LOW (ref 39.0–52.0)
Hemoglobin: 11 g/dL — ABNORMAL LOW (ref 13.0–17.0)
Immature Granulocytes: 2 %
Lymphocytes Relative: 16 %
Lymphs Abs: 0.3 10*3/uL — ABNORMAL LOW (ref 0.7–4.0)
MCH: 33.5 pg (ref 26.0–34.0)
MCHC: 34.2 g/dL (ref 30.0–36.0)
MCV: 98.2 fL (ref 80.0–100.0)
Monocytes Absolute: 0.1 10*3/uL (ref 0.1–1.0)
Monocytes Relative: 7 %
Neutro Abs: 1.4 10*3/uL — ABNORMAL LOW (ref 1.7–7.7)
Neutrophils Relative %: 74 %
Platelet Count: 37 10*3/uL — ABNORMAL LOW (ref 150–400)
RBC: 3.28 MIL/uL — ABNORMAL LOW (ref 4.22–5.81)
RDW: 17.1 % — ABNORMAL HIGH (ref 11.5–15.5)
WBC Count: 1.9 10*3/uL — ABNORMAL LOW (ref 4.0–10.5)
nRBC: 0 % (ref 0.0–0.2)

## 2023-06-11 LAB — RETICULOCYTES
Immature Retic Fract: 12.9 % (ref 2.3–15.9)
RBC.: 3.23 MIL/uL — ABNORMAL LOW (ref 4.22–5.81)
Retic Count, Absolute: 51.7 10*3/uL (ref 19.0–186.0)
Retic Ct Pct: 1.6 % (ref 0.4–3.1)

## 2023-06-11 LAB — SAVE SMEAR(SSMR), FOR PROVIDER SLIDE REVIEW

## 2023-06-11 LAB — LACTATE DEHYDROGENASE: LDH: 255 U/L — ABNORMAL HIGH (ref 98–192)

## 2023-06-11 NOTE — Telephone Encounter (Signed)
 Returned call to [place].  She is aware Dr. Ellison Ha is out of office until Monday. She would like for him to return call when he is back in office

## 2023-06-11 NOTE — Progress Notes (Signed)
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 

## 2023-06-11 NOTE — Progress Notes (Signed)
 Hematology and Oncology Follow Up Visit  EMMAUS BRANDI [id number] November 15, 1940 83 y.o. 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  He really has had no complaints.  Unfortunately, his blood counts continue to drop.  I think that we are going to have to get him on treatment at this point.  Had long talk with he and his wife today.  I think that he will be a good candidate for Vidaza as a single agent.  Hopefully, we will be able to improve his blood counts so that he will not run into problems with bleeding or infection.  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  We are to have to get him off Coumadin as I suspect that he probably will be thrombocytopenic.  I will have to speak to his cardiologist to see how we can try to manage his anticoagulation.  I think that we might need to get him on Lovenox during his treatment to help prevent him from having a CVA.  He is still traveling.  I hope that our protocol will not interfere too much with his travels.  He has had no bleeding.  He does have a couple small ecchymoses.  He has had no nausea or vomiting.  He has had no cough or shortness of breath.  He has had no change in bowel or bladder habits.  He has had no headache.  His appetite has been quite good.  Overall, I would say that his performance status is probably ECOG 0.  Medications:  Current Outpatient Medications:    amLODipine (NORVASC) 5 MG tablet, TAKE ONE TABLET BY MOUTH ONE TIME DAILY, Disp: 90 tablet, Rfl: 3   atorvastatin (LIPITOR) 10 MG tablet, TAKE ONE TABLET BY MOUTH ONE TIME DAILY, Disp: 90 tablet, Rfl: 2   hydrocortisone 2.5 % cream, Apply 1 Application topically daily as needed., Disp: , Rfl:    pantoprazole  (PROTONIX) 40 MG tablet, TAKE ONE TABLET BY MOUTH ONE TIME DAILY (MUST KEEP 06/24/2022 APPOINTMENT FOR FUTURE REFILLS), Disp: 30 tablet, Rfl: 0   tadalafil (CIALIS) 5 MG tablet, Take 1 tablet (5 mg total) by mouth daily., Disp: 90 tablet, Rfl: 3   warfarin (JANTOVEN) 7.5 MG tablet, TAKE ONE TO ONE AND ONE-HALF TABLETS BY MOUTH ONE TIME DAILY OR AS DIRECTED BY THE ANTICOAGULATION CLINIC, Disp: 120 tablet, Rfl: 1   amoxicillin (AMOXIL) 500 MG capsule, take 1 capsule by mouth four times a day (Patient not taking: Reported on 09/11/2022), Disp: 20 capsule, Rfl: 3   ciprofloxacin (CIPRO) 500 MG tablet, Take 1 tablet (500 mg total) by mouth 2 (two) times daily. (Patient not taking: Reported on 09/11/2022), Disp: 20 tablet, Rfl: 2   diphenoxylate-atropine (LOMOTIL) 2.5-0.025 MG tablet, Take 1 tablet by mouth 4 (four) times daily as needed for diarrhea or loose stools. (Patient not taking: Reported on 06/11/2023), Disp: 60 tablet, Rfl: 1   SUMAtriptan (IMITREX) 25 MG tablet, TAKE ONE TABLET BY MOUTH AT ONSET OF HEADACHE. MAY REPEAT DOSE WITH ONE TABLET IN TWO HOURS IF NEEDED. DO NOT EXCEED TWO TABLETS IN 24 HOURS. (Patient not taking: Reported on 06/11/2023), Disp: 12 tablet, Rfl: 0  Allergies:  Allergies  Allergen Reactions   Codeine  Rash   Terbinafine Itching    Past Medical History, Surgical history, Social history, and Family History were reviewed and updated.  Review of Systems: Review of Systems  Constitutional: Negative.   HENT:  Negative.    Eyes: Negative.   Respiratory: Negative.    Cardiovascular: Negative.   Gastrointestinal: Negative.   Endocrine: Negative.   Genitourinary: Negative.    Musculoskeletal: Negative.   Skin: Negative.   Neurological: Negative.   Hematological: Negative.   Psychiatric/Behavioral: Negative.      Physical Exam:  height is 6' (1.829 m) and weight is 172 lb 6.4 oz (78.2 kg). His oral temperature is 97.9 F (36.6 C). His blood pressure is 127/72 and his  pulse is 55 (abnormal). His respiration is 18 and oxygen saturation is 99%.   Wt Readings from Last 3 Encounters:  06/11/23 172 lb 6.4 oz (78.2 kg)  04/30/23 174 lb 6.4 oz (79.1 kg)  03/20/23 177 lb (80.3 kg)    Physical Exam Vitals reviewed.  HENT:     Head: Normocephalic and atraumatic.  Eyes:     Pupils: Pupils are equal, round, and reactive to light.     Comments: Ocular exam does show the conjunctival hemorrhage with the right eye.  Pupil reacts appropriately.  He has good extraocular muscle movement.  Cardiovascular:     Rate and Rhythm: Normal rate and regular rhythm.     Heart sounds: Normal heart sounds.     Comments: Cardiac exam shows a regular rate and rhythm.  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  I hear no murmurs or rubs or bruits. Pulmonary:     Effort: Pulmonary effort is normal.     Breath sounds: Normal breath sounds.  Abdominal:     General: Bowel sounds are normal.     Palpations: Abdomen is soft.  Musculoskeletal:        General: No tenderness or deformity. Normal range of motion.     Cervical back: Normal range of motion.  Lymphadenopathy:     Cervical: No cervical adenopathy.  Skin:    General: Skin is warm and dry.     Findings: No erythema or rash.  Neurological:     Mental Status: He is alert and oriented to person, place, and time.  Psychiatric:        Behavior: Behavior normal.        Thought Content: Thought content normal.        Judgment: Judgment normal.     Lab Results  Component Value Date   WBC 1.9 (L) 06/11/2023   HGB 11.0 (L) 06/11/2023   HCT 32.2 (L) 06/11/2023   MCV 98.2 06/11/2023   PLT 37 (L) 06/11/2023     Chemistry      Component Value Date/Time   NA 139 04/30/2023 1444   NA 139 11/12/2020 1030   K 4.4 04/30/2023 1444   CL 106 04/30/2023 1444   CO2 29 04/30/2023 1444   BUN 17 04/30/2023 1444   BUN 16 11/12/2020 1030   CREATININE 1.01 04/30/2023 1444      Component Value Date/Time    CALCIUM 9.5 04/30/2023 1444   ALKPHOS 73 04/30/2023 1444   AST 15 04/30/2023 1444   ALT 14 04/30/2023 1444   BILITOT 0.6 04/30/2023 1444       Impression and Plan: Mr. Segal is a 83 year old white male.  He has mild leukopenia and thrombocytopenia. I did do a bone marrow biopsy on him.  This confirmed that he has myelodysplasia.  Again, I would classify him as refractory anemia with excess blast-1  (RAEB-1).  Again, his blood counts are clearly going downward.  We have been watching for probably couple years.  I think his last bone marrow was done back in July.  I really do not think we have to do another bone marrow test on him.  I think we try to use single agent Vidaza for him.  I think that if we do not get a good response with single agent Vidaza then we can add venetoclax.  Again, we will have to figure out how to manage his anticoagulation.  I will like to have him get started on treatment May 5.  I will see him back that day.  Again I had to have him check his labs weekly while he is getting treatment.    Ivor Mars, MD 4/17/202510:24 AM

## 2023-06-11 NOTE — Telephone Encounter (Signed)
 Patrick [name] with [facility] is calling stating Dr. Maria Shiner would like to speak with Dr. Abel Hoe who saw the patient today.  Advised he is out of office til Monday, but will send telephone note for callback then.  Please advise.

## 2023-06-12 ENCOUNTER — Other Ambulatory Visit: Payer: Self-pay

## 2023-06-15 ENCOUNTER — Telehealth: Payer: Self-pay | Admitting: Cardiovascular Disease

## 2023-06-25 ENCOUNTER — Other Ambulatory Visit: Payer: Self-pay | Admitting: Internal Medicine

## 2023-06-25 DIAGNOSIS — L309 Dermatitis, unspecified: Secondary | ICD-10-CM | POA: Diagnosis not present

## 2023-06-25 DIAGNOSIS — Z85828 Personal history of other malignant neoplasm of skin: Secondary | ICD-10-CM | POA: Diagnosis not present

## 2023-06-25 DIAGNOSIS — L820 Inflamed seborrheic keratosis: Secondary | ICD-10-CM | POA: Diagnosis not present

## 2023-06-25 DIAGNOSIS — L821 Other seborrheic keratosis: Secondary | ICD-10-CM | POA: Diagnosis not present

## 2023-06-25 DIAGNOSIS — D225 Melanocytic nevi of trunk: Secondary | ICD-10-CM | POA: Diagnosis not present

## 2023-06-25 DIAGNOSIS — L853 Xerosis cutis: Secondary | ICD-10-CM | POA: Diagnosis not present

## 2023-06-25 DIAGNOSIS — L570 Actinic keratosis: Secondary | ICD-10-CM | POA: Diagnosis not present

## 2023-06-29 ENCOUNTER — Other Ambulatory Visit: Payer: Self-pay

## 2023-06-29 ENCOUNTER — Inpatient Hospital Stay (HOSPITAL_BASED_OUTPATIENT_CLINIC_OR_DEPARTMENT_OTHER): Admitting: Hematology & Oncology

## 2023-06-29 ENCOUNTER — Encounter: Payer: Self-pay | Admitting: Hematology & Oncology

## 2023-06-29 ENCOUNTER — Inpatient Hospital Stay

## 2023-06-29 ENCOUNTER — Inpatient Hospital Stay: Attending: Hematology & Oncology

## 2023-06-29 VITALS — BP 141/71 | HR 61

## 2023-06-29 VITALS — BP 140/71 | HR 57 | Temp 97.5°F | Resp 17 | Ht 72.0 in

## 2023-06-29 DIAGNOSIS — D46Z Other myelodysplastic syndromes: Secondary | ICD-10-CM | POA: Diagnosis not present

## 2023-06-29 DIAGNOSIS — Z7901 Long term (current) use of anticoagulants: Secondary | ICD-10-CM | POA: Insufficient documentation

## 2023-06-29 DIAGNOSIS — Z5111 Encounter for antineoplastic chemotherapy: Secondary | ICD-10-CM | POA: Diagnosis not present

## 2023-06-29 DIAGNOSIS — D4621 Refractory anemia with excess of blasts 1: Secondary | ICD-10-CM | POA: Insufficient documentation

## 2023-06-29 DIAGNOSIS — Z79899 Other long term (current) drug therapy: Secondary | ICD-10-CM | POA: Diagnosis not present

## 2023-06-29 LAB — CBC WITH DIFFERENTIAL (CANCER CENTER ONLY)
Abs Immature Granulocytes: 0.02 10*3/uL (ref 0.00–0.07)
Basophils Absolute: 0 10*3/uL (ref 0.0–0.1)
Basophils Relative: 0 %
Eosinophils Absolute: 0 10*3/uL (ref 0.0–0.5)
Eosinophils Relative: 1 %
HCT: 30.6 % — ABNORMAL LOW (ref 39.0–52.0)
Hemoglobin: 10.5 g/dL — ABNORMAL LOW (ref 13.0–17.0)
Immature Granulocytes: 1 %
Lymphocytes Relative: 17 %
Lymphs Abs: 0.3 10*3/uL — ABNORMAL LOW (ref 0.7–4.0)
MCH: 33.5 pg (ref 26.0–34.0)
MCHC: 34.3 g/dL (ref 30.0–36.0)
MCV: 97.8 fL (ref 80.0–100.0)
Monocytes Absolute: 0.1 10*3/uL (ref 0.1–1.0)
Monocytes Relative: 8 %
Neutro Abs: 1.1 10*3/uL — ABNORMAL LOW (ref 1.7–7.7)
Neutrophils Relative %: 73 %
Platelet Count: 37 10*3/uL — ABNORMAL LOW (ref 150–400)
RBC: 3.13 MIL/uL — ABNORMAL LOW (ref 4.22–5.81)
RDW: 17.3 % — ABNORMAL HIGH (ref 11.5–15.5)
WBC Count: 1.6 10*3/uL — ABNORMAL LOW (ref 4.0–10.5)
nRBC: 0 % (ref 0.0–0.2)

## 2023-06-29 LAB — CMP (CANCER CENTER ONLY)
ALT: 13 U/L (ref 0–44)
AST: 16 U/L (ref 15–41)
Albumin: 4.4 g/dL (ref 3.5–5.0)
Alkaline Phosphatase: 58 U/L (ref 38–126)
Anion gap: 5 (ref 5–15)
BUN: 17 mg/dL (ref 8–23)
CO2: 27 mmol/L (ref 22–32)
Calcium: 9.4 mg/dL (ref 8.9–10.3)
Chloride: 106 mmol/L (ref 98–111)
Creatinine: 1.16 mg/dL (ref 0.61–1.24)
GFR, Estimated: >60 mL/min (ref >60)
Glucose, Bld: 112 mg/dL — ABNORMAL HIGH (ref 70–99)
Potassium: 4.2 mmol/L (ref 3.5–5.1)
Sodium: 138 mmol/L (ref 135–145)
Total Bilirubin: 0.9 mg/dL (ref 0.0–1.2)
Total Protein: 6.3 g/dL — ABNORMAL LOW (ref 6.5–8.1)

## 2023-06-29 LAB — RETICULOCYTES
Immature Retic Fract: 17.8 % — ABNORMAL HIGH (ref 2.3–15.9)
RBC.: 3.13 MIL/uL — ABNORMAL LOW (ref 4.22–5.81)
Retic Count, Absolute: 51.3 10*3/uL (ref 19.0–186.0)
Retic Ct Pct: 1.6 % (ref 0.4–3.1)

## 2023-06-29 LAB — SAVE SMEAR(SSMR), FOR PROVIDER SLIDE REVIEW

## 2023-06-29 MED ORDER — ITRACONAZOLE 100 MG PO CAPS: 100.0000 mg | ORAL_CAPSULE | Freq: Every day | ORAL | 6 refills | Status: DC

## 2023-06-29 MED ORDER — AZACITIDINE CHEMO SQ INJECTION
75.0000 mg/m2 | Freq: Once | INTRAMUSCULAR | Status: AC
  Administered 2023-06-29: 150 mg via SUBCUTANEOUS
  Filled 2023-06-29: qty 6

## 2023-06-29 MED ORDER — FAMCICLOVIR 250 MG PO TABS: 250.0000 mg | ORAL_TABLET | Freq: Every day | ORAL | 12 refills | Status: DC

## 2023-06-29 MED ORDER — ONDANSETRON HCL 8 MG PO TABS: 8.0000 mg | ORAL_TABLET | Freq: Three times a day (TID) | ORAL | 1 refills | Status: DC | PRN

## 2023-06-29 MED ORDER — PROCHLORPERAZINE MALEATE 10 MG PO TABS: 10.0000 mg | ORAL_TABLET | Freq: Four times a day (QID) | ORAL | 1 refills | Status: DC | PRN

## 2023-06-29 MED ORDER — ENOXAPARIN SODIUM 120 MG/0.8ML IJ SOSY: 120.0000 mg | PREFILLED_SYRINGE | INTRAMUSCULAR | 12 refills | Status: AC

## 2023-06-29 MED ORDER — ONDANSETRON HCL 8 MG PO TABS
8.0000 mg | ORAL_TABLET | Freq: Once | ORAL | Status: AC
  Administered 2023-06-29: 8 mg via ORAL
  Filled 2023-06-29: qty 1

## 2023-06-29 MED ORDER — LEVOFLOXACIN 250 MG PO TABS: 250.0000 mg | ORAL_TABLET | Freq: Every day | ORAL | 4 refills | Status: DC

## 2023-06-29 NOTE — Progress Notes (Signed)
 Patient teaching done on how to administer lovenox  at home. Rolley Clinton RN

## 2023-06-29 NOTE — Progress Notes (Addendum)
 Ok to proceed with Vidaza today with ANC 1.1 and pltc 37.  Jobe Mulder [name], [place], BCPS,  BCOP 06/29/2023  10:45 AM   Pharmacist Chemotherapy Monitoring - Initial Assessment    Anticipated start date: 06/29/23   The following has been reviewed per standard work regarding the patient's treatment regimen: The patient's diagnosis, treatment plan and drug doses, and organ/hematologic function Lab orders and baseline tests specific to treatment regimen  The treatment plan start date, drug sequencing, and pre-medications Prior authorization status  Patient's documented medication list, including drug-drug interaction screen and prescriptions for anti-emetics and supportive care specific to the treatment regimen The drug concentrations, fluid compatibility, administration routes, and timing of the medications to be used The patient's access for treatment and lifetime cumulative dose history, if applicable  The patient's medication allergies and previous infusion related reactions, if applicable   Changes made to treatment plan:  treatment plan date  Follow up needed:  1) Anticoagulation management - DC Coumadin  2) Antiemetics for home use  Elodia Hailstone, RPH, BCPS,  BCOP 06/29/2023  10:45 AM

## 2023-06-29 NOTE — Patient Instructions (Signed)
 [facility] A [facility]  Discharge Instructions: Thank you for choosing [facility] to provide your oncology and hematology care.   If you have a lab appointment with the Cancer Center, please go directly to the Cancer Center and check in at the registration area.  Wear comfortable clothing and clothing appropriate for easy access to any Portacath or PICC line.   We strive to give you quality time with your provider. You may need to reschedule your appointment if you arrive late (15 or more minutes).  Arriving late affects you and other patients whose appointments are after yours.  Also, if you miss three or more appointments without notifying the office, you may be dismissed from the clinic at the provider's discretion.      For prescription refill requests, have your pharmacy contact our office and allow 72 hours for refills to be completed.    Today you received the following chemotherapy and/or immunotherapy agents 5-aza      To help prevent nausea and vomiting after your treatment, we encourage you to take your nausea medication as directed.  BELOW ARE SYMPTOMS THAT SHOULD BE REPORTED IMMEDIATELY: *FEVER GREATER THAN 100.4 F (38 C) OR HIGHER *CHILLS OR SWEATING *NAUSEA AND VOMITING THAT IS NOT CONTROLLED WITH YOUR NAUSEA MEDICATION *UNUSUAL SHORTNESS OF BREATH *UNUSUAL BRUISING OR BLEEDING *URINARY PROBLEMS (pain or burning when urinating, or frequent urination) *BOWEL PROBLEMS (unusual diarrhea, constipation, pain near the anus) TENDERNESS IN MOUTH AND THROAT WITH OR WITHOUT PRESENCE OF ULCERS (sore throat, sores in mouth, or a toothache) UNUSUAL RASH, SWELLING OR PAIN  UNUSUAL VAGINAL DISCHARGE OR ITCHING   Items with * indicate a potential emergency and should be followed up as soon as possible or go to the Emergency Department if any problems should occur.  Please show the CHEMOTHERAPY ALERT CARD or IMMUNOTHERAPY ALERT  CARD at check-in to the Emergency Department and triage nurse. Should you have questions after your visit or need to cancel or reschedule your appointment, please contact [facility] - A DEPT OF [facility]  [phone] and follow the prompts.  Office hours are 8:00 a.m. to 4:30 p.m. Monday - Friday. Please note that voicemails left after 4:00 p.m. may not be returned until the following business day.  We are closed weekends and major holidays. You have access to a nurse at all times for urgent questions. Please call the main number to the clinic [phone] and follow the prompts.  For any non-urgent questions, you may also contact your provider using MyChart. We now offer e-Visits for anyone 44 and older to request care online for non-urgent symptoms. For details visit [email or web address].   Also download the MyChart app! Go to the app store, search "MyChart", open the app, select [place], and log in with your MyChart username and password.

## 2023-06-29 NOTE — Progress Notes (Signed)
 9 Hematology and Oncology Follow Up Visit  Patrick [name] [id number] 21-Apr-1940 83 y.o. 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  He really has had no complaints.  Unfortunately, his blood counts continue to drop.  I think that we are going to have to get him on treatment at this point.  He will start laser today.  His white cell count continues to drop.  We are going to have to get him on Lovenox .  He has mechanical heart valve.  I talked with his cardiologist-Dr. Bertie [name] agrees with Lovenox .  We will dose him at 120 mg daily.  This is to help protect him from a CVA.  I know that he will be thrombocytopenic.  We may have to make some adjustments with his anticoagulation.  We are going to have to have his labs checked twice a week now that he starts treatment.  Otherwise, he is doing all right.  Had a good weekend.  He is eating okay.  Has had no problems with nausea or vomiting.  Has had no bleeding.  Has had no cough or shortness of breath.  The [employer] was recently.  Unfortunately, the tarriff situation really affected the buying and selling of furniture.  Currently, I would say that his performance status about ECOG 1.  Medications:  Current Outpatient Medications:    amLODipine  (NORVASC ) 5 MG tablet, TAKE ONE TABLET BY MOUTH ONE TIME DAILY, Disp: 90 tablet, Rfl: 3   amoxicillin  (AMOXIL ) 500 MG capsule, take 1 capsule by mouth four times a day (Patient not taking: Reported on 09/11/2022), Disp: 20 capsule, Rfl: 3   atorvastatin  (LIPITOR) 10 MG tablet, TAKE ONE TABLET BY MOUTH ONE TIME DAILY, Disp: 90 tablet, Rfl: 2   ciprofloxacin  (CIPRO ) 500 MG tablet, Take 1 tablet (500 mg total) by mouth 2 (two) times daily. (Patient not taking: Reported  on 09/11/2022), Disp: 20 tablet, Rfl: 2   diphenoxylate -atropine  (LOMOTIL ) 2.5-0.025 MG tablet, Take 1 tablet by mouth 4 (four) times daily as needed for diarrhea or loose stools. (Patient not taking: Reported on 06/11/2023), Disp: 60 tablet, Rfl: 1   hydrocortisone 2.5 % cream, Apply 1 Application topically daily as needed., Disp: , Rfl:    pantoprazole  (PROTONIX ) 40 MG tablet, TAKE ONE TABLET BY MOUTH ONE TIME DAILY (MUST KEEP 06/24/2022 APPOINTMENT FOR FUTURE REFILLS), Disp: 30 tablet, Rfl: 0   SUMAtriptan  (IMITREX ) 25 MG tablet, TAKE ONE TABLET BY MOUTH AT ONSET OF HEADACHE. MAY REPEAT DOSE WITH ONE TABLET IN TWO HOURS IF NEEDED. DO NOT EXCEED TWO TABLETS IN 24 HOURS. (Patient not taking: Reported on 06/11/2023), Disp: 12 tablet, Rfl: 0   tadalafil  (CIALIS ) 5 MG tablet, Take 1 tablet (5 mg total) by mouth daily., Disp: 90 tablet, Rfl: 3   warfarin (JANTOVEN ) 7.5 MG tablet, TAKE ONE TO ONE AND ONE-HALF TABLETS BY MOUTH ONE TIME DAILY OR AS DIRECTED BY THE ANTICOAGULATION CLINIC, Disp: 120 tablet, Rfl: 1  Allergies:  Allergies  Allergen Reactions   Codeine  Rash   Terbinafine Itching    Past Medical History, Surgical history, Social history, and Family History were reviewed and updated.  Review of Systems: Review of Systems  Constitutional: Negative.   HENT:  Negative.    Eyes:  Negative.   Respiratory: Negative.    Cardiovascular: Negative.   Gastrointestinal: Negative.   Endocrine: Negative.   Genitourinary: Negative.    Musculoskeletal: Negative.   Skin: Negative.   Neurological: Negative.   Hematological: Negative.   Psychiatric/Behavioral: Negative.      Physical Exam:  height is 6' (1.829 m). His oral temperature is 97.5 F (36.4 C) (abnormal). His blood pressure is 140/71 (abnormal) and his pulse is 57 (abnormal). His respiration is 17 and oxygen saturation is 99%.   Wt Readings from Last 3 Encounters:  06/11/23 172 lb 6.4 oz (78.2 kg)  04/30/23 174 lb 6.4 oz (79.1 kg)   03/20/23 177 lb (80.3 kg)    Physical Exam Vitals reviewed.  HENT:     Head: Normocephalic and atraumatic.  Eyes:     Pupils: Pupils are equal, round, and reactive to light.     Comments: Ocular exam does show the conjunctival hemorrhage with the right eye.  Pupil reacts appropriately.  He has good extraocular muscle movement.  Cardiovascular:     Rate and Rhythm: Normal rate and regular rhythm.     Heart sounds: Normal heart sounds.     Comments: Cardiac exam shows a regular rate and rhythm.  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  I hear no murmurs or rubs or bruits. Pulmonary:     Effort: Pulmonary effort is normal.     Breath sounds: Normal breath sounds.  Abdominal:     General: Bowel sounds are normal.     Palpations: Abdomen is soft.  Musculoskeletal:        General: No tenderness or deformity. Normal range of motion.     Cervical back: Normal range of motion.  Lymphadenopathy:     Cervical: No cervical adenopathy.  Skin:    General: Skin is warm and dry.     Findings: No erythema or rash.  Neurological:     Mental Status: He is alert and oriented to person, place, and time.  Psychiatric:        Behavior: Behavior normal.        Thought Content: Thought content normal.        Judgment: Judgment normal.      Lab Results  Component Value Date   WBC 1.6 (L) 06/29/2023   HGB 10.5 (L) 06/29/2023   HCT 30.6 (L) 06/29/2023   MCV 97.8 06/29/2023   PLT 37 (L) 06/29/2023     Chemistry      Component Value Date/Time   NA 138 06/29/2023 0912   NA 139 11/12/2020 1030   K 4.2 06/29/2023 0912   CL 106 06/29/2023 0912   CO2 27 06/29/2023 0912   BUN 17 06/29/2023 0912   BUN 16 11/12/2020 1030   CREATININE 1.16 06/29/2023 0912      Component Value Date/Time   CALCIUM  9.4 06/29/2023 0912   ALKPHOS 58 06/29/2023 0912   AST 16 06/29/2023 0912   ALT 13 06/29/2023 0912   BILITOT 0.9 06/29/2023 0912       Impression and Plan: Patrick [name]  is a 83 year old white male.  He has mild leukopenia and thrombocytopenia. I did do a bone marrow biopsy on him.  This confirmed that he has myelodysplasia.  Again, I would classify him as refractory anemia with excess blast-1  (RAEB-1).  Again, his blood counts are clearly going downward.  We have been watching for probably couple years.  I think his last bone marrow was  done back in July.  I really do not think we have to do another bone marrow test on him.  We will go ahead with the Vidaza.  Again, we will do Vidaza for 7 days.  We will need to check his labs twice a week.  Again we will have him on Lovenox .  This is very complicated.  We have a lot going on.  We will have to get him on antibiotic prophylaxis.  I will plan to have him come back to see us  in a couple of weeks.   Ivor Mars, MD 5/5/202510:07 AM

## 2023-06-30 ENCOUNTER — Inpatient Hospital Stay

## 2023-06-30 ENCOUNTER — Telehealth: Payer: Self-pay

## 2023-06-30 VITALS — BP 120/60 | HR 59 | Temp 97.7°F | Resp 18

## 2023-06-30 DIAGNOSIS — D4621 Refractory anemia with excess of blasts 1: Secondary | ICD-10-CM

## 2023-06-30 DIAGNOSIS — Z79899 Other long term (current) drug therapy: Secondary | ICD-10-CM | POA: Diagnosis not present

## 2023-06-30 DIAGNOSIS — D46Z Other myelodysplastic syndromes: Secondary | ICD-10-CM

## 2023-06-30 DIAGNOSIS — Z7901 Long term (current) use of anticoagulants: Secondary | ICD-10-CM | POA: Diagnosis not present

## 2023-06-30 DIAGNOSIS — Z5111 Encounter for antineoplastic chemotherapy: Secondary | ICD-10-CM | POA: Diagnosis not present

## 2023-06-30 MED ORDER — ONDANSETRON HCL 8 MG PO TABS
8.0000 mg | ORAL_TABLET | Freq: Once | ORAL | Status: AC
Start: 2023-06-30 — End: 2023-06-30
  Administered 2023-06-30: 8 mg via ORAL
  Filled 2023-06-30: qty 1

## 2023-06-30 MED ORDER — AZACITIDINE CHEMO SQ INJECTION
75.0000 mg/m2 | Freq: Once | INTRAMUSCULAR | Status: AC
  Administered 2023-06-30: 150 mg via SUBCUTANEOUS
  Filled 2023-06-30: qty 6

## 2023-06-30 NOTE — Patient Instructions (Signed)
 [facility]  Discharge Instructions: Thank you for choosing [facility] to provide your oncology and hematology care.   If you have a lab appointment with the Cancer Center, please go directly to the Cancer Center and check in at the registration area.  Wear comfortable clothing and clothing appropriate for easy access to any Portacath or PICC line.   We strive to give you quality time with your provider. You may need to reschedule your appointment if you arrive late (15 or more minutes).  Arriving late affects you and other patients whose appointments are after yours.  Also, if you miss three or more appointments without notifying the office, you may be dismissed from the clinic at the provider's discretion.      For prescription refill requests, have your pharmacy contact our office and allow 72 hours for refills to be completed.    Today you received the following chemotherapy and/or immunotherapy agents 5-AZA     To help prevent nausea and vomiting after your treatment, we encourage you to take your nausea medication as directed.  BELOW ARE SYMPTOMS THAT SHOULD BE REPORTED IMMEDIATELY: *FEVER GREATER THAN 100.4 F (38 C) OR HIGHER *CHILLS OR SWEATING *NAUSEA AND VOMITING THAT IS NOT CONTROLLED WITH YOUR NAUSEA MEDICATION *UNUSUAL SHORTNESS OF BREATH *UNUSUAL BRUISING OR BLEEDING *URINARY PROBLEMS (pain or burning when urinating, or frequent urination) *BOWEL PROBLEMS (unusual diarrhea, constipation, pain near the anus) TENDERNESS IN MOUTH AND THROAT WITH OR WITHOUT PRESENCE OF ULCERS (sore throat, sores in mouth, or a toothache) UNUSUAL RASH, SWELLING OR PAIN  UNUSUAL VAGINAL DISCHARGE OR ITCHING   Items with * indicate a potential emergency and should be followed up as soon as possible or go to the Emergency Department if any problems should occur.  Please show the CHEMOTHERAPY ALERT CARD or IMMUNOTHERAPY ALERT  CARD at check-in to the Emergency Department and triage nurse. Should you have questions after your visit or need to cancel or reschedule your appointment, please contact [facility] - A DEPT OF [facility]  [phone] and follow the prompts.  Office hours are 8:00 a.m. to 4:30 p.m. Monday - Friday. Please note that voicemails left after 4:00 p.m. may not be returned until the following business day.  We are closed weekends and major holidays. You have access to a nurse at all times for urgent questions. Please call the main number to the clinic [phone] and follow the prompts.  For any non-urgent questions, you may also contact your provider using MyChart. We now offer e-Visits for anyone 72 and older to request care online for non-urgent symptoms. For details visit [email or web address].   Also download the MyChart app! Go to the app store, search "MyChart", open the app, select [place], and log in with your MyChart username and password.

## 2023-06-30 NOTE — Telephone Encounter (Signed)
 Notified Patient of prior authorization approval for Itraconazole 100 mg Capsules. Medication is approved through 06/29/2024. Pharmacy notified. No other needs or concerns noted at this time.

## 2023-07-01 ENCOUNTER — Inpatient Hospital Stay

## 2023-07-01 ENCOUNTER — Inpatient Hospital Stay: Admitting: Licensed Clinical Social Worker

## 2023-07-01 VITALS — BP 132/59 | HR 61 | Temp 97.9°F | Resp 17

## 2023-07-01 DIAGNOSIS — D4621 Refractory anemia with excess of blasts 1: Secondary | ICD-10-CM | POA: Diagnosis not present

## 2023-07-01 DIAGNOSIS — Z7901 Long term (current) use of anticoagulants: Secondary | ICD-10-CM | POA: Diagnosis not present

## 2023-07-01 DIAGNOSIS — Z79899 Other long term (current) drug therapy: Secondary | ICD-10-CM | POA: Diagnosis not present

## 2023-07-01 DIAGNOSIS — D46Z Other myelodysplastic syndromes: Secondary | ICD-10-CM

## 2023-07-01 DIAGNOSIS — Z5111 Encounter for antineoplastic chemotherapy: Secondary | ICD-10-CM | POA: Diagnosis not present

## 2023-07-01 MED ORDER — ONDANSETRON HCL 8 MG PO TABS: 8.0000 mg | ORAL_TABLET | Freq: Once | ORAL | Status: DC

## 2023-07-01 MED ORDER — AZACITIDINE CHEMO SQ INJECTION
75.0000 mg/m2 | Freq: Once | INTRAMUSCULAR | Status: AC
  Administered 2023-07-01: 150 mg via SUBCUTANEOUS
  Filled 2023-07-01: qty 6

## 2023-07-01 NOTE — Progress Notes (Signed)
 [facility] Clinical Social Work  Visual merchandiser met with patient in infusion to offer support and assess for needs.  Patient's wife, Nancey Awkward, was also present.  Provided education regarding the [employer] Program.  Patient was interested in the free massages.  CSW provided the certificates and obtained Dr. Birt Bulla consent.  Patient expressed no other needs.       Kennth Peal, LCSW  Clinical Social Worker [facility]

## 2023-07-01 NOTE — Patient Instructions (Signed)
 [facility] - A DEPT OF [facility]  Discharge Instructions: Thank you for choosing [facility] to provide your oncology and hematology care.   If you have a lab appointment with the Cancer Center, please go directly to the Cancer Center and check in at the registration area.  Wear comfortable clothing and clothing appropriate for easy access to any Portacath or PICC line.   We strive to give you quality time with your provider. You may need to reschedule your appointment if you arrive late (15 or more minutes).  Arriving late affects you and other patients whose appointments are after yours.  Also, if you miss three or more appointments without notifying the office, you may be dismissed from the clinic at the provider's discretion.      For prescription refill requests, have your pharmacy contact our office and allow 72 hours for refills to be completed.    Today you received the following chemotherapy and/or immunotherapy agents:  Vidaza      To help prevent nausea and vomiting after your treatment, we encourage you to take your nausea medication as directed.  BELOW ARE SYMPTOMS THAT SHOULD BE REPORTED IMMEDIATELY: *FEVER GREATER THAN 100.4 F (38 C) OR HIGHER *CHILLS OR SWEATING *NAUSEA AND VOMITING THAT IS NOT CONTROLLED WITH YOUR NAUSEA MEDICATION *UNUSUAL SHORTNESS OF BREATH *UNUSUAL BRUISING OR BLEEDING *URINARY PROBLEMS (pain or burning when urinating, or frequent urination) *BOWEL PROBLEMS (unusual diarrhea, constipation, pain near the anus) TENDERNESS IN MOUTH AND THROAT WITH OR WITHOUT PRESENCE OF ULCERS (sore throat, sores in mouth, or a toothache) UNUSUAL RASH, SWELLING OR PAIN  UNUSUAL VAGINAL DISCHARGE OR ITCHING   Items with * indicate a potential emergency and should be followed up as soon as possible or go to the Emergency Department if any problems should occur.  Please show the CHEMOTHERAPY ALERT CARD or IMMUNOTHERAPY  ALERT CARD at check-in to the Emergency Department and triage nurse. Should you have questions after your visit or need to cancel or reschedule your appointment, please contact [facility] - A DEPT OF [facility]  [phone] and follow the prompts.  Office hours are 8:00 a.m. to 4:30 p.m. Monday - Friday. Please note that voicemails left after 4:00 p.m. may not be returned until the following business day.  We are closed weekends and major holidays. You have access to a nurse at all times for urgent questions. Please call the main number to the clinic [phone] and follow the prompts.  For any non-urgent questions, you may also contact your provider using MyChart. We now offer e-Visits for anyone 30 and older to request care online for non-urgent symptoms. For details visit [email or web address].   Also download the MyChart app! Go to the app store, search "MyChart", open the app, select [place], and log in with your MyChart username and password.

## 2023-07-02 ENCOUNTER — Inpatient Hospital Stay

## 2023-07-02 VITALS — BP 122/62 | HR 62 | Temp 98.5°F | Resp 18

## 2023-07-02 DIAGNOSIS — D4621 Refractory anemia with excess of blasts 1: Secondary | ICD-10-CM | POA: Diagnosis not present

## 2023-07-02 DIAGNOSIS — Z79899 Other long term (current) drug therapy: Secondary | ICD-10-CM | POA: Diagnosis not present

## 2023-07-02 DIAGNOSIS — Z7901 Long term (current) use of anticoagulants: Secondary | ICD-10-CM | POA: Diagnosis not present

## 2023-07-02 DIAGNOSIS — Z5111 Encounter for antineoplastic chemotherapy: Secondary | ICD-10-CM | POA: Diagnosis not present

## 2023-07-02 DIAGNOSIS — D46Z Other myelodysplastic syndromes: Secondary | ICD-10-CM

## 2023-07-02 LAB — CBC WITH DIFFERENTIAL (CANCER CENTER ONLY)
Abs Immature Granulocytes: 0.02 10*3/uL (ref 0.00–0.07)
Basophils Absolute: 0 10*3/uL (ref 0.0–0.1)
Basophils Relative: 0 %
Eosinophils Absolute: 0 10*3/uL (ref 0.0–0.5)
Eosinophils Relative: 1 %
HCT: 29.4 % — ABNORMAL LOW (ref 39.0–52.0)
Hemoglobin: 10.2 g/dL — ABNORMAL LOW (ref 13.0–17.0)
Immature Granulocytes: 1 %
Lymphocytes Relative: 14 %
Lymphs Abs: 0.3 10*3/uL — ABNORMAL LOW (ref 0.7–4.0)
MCH: 34.3 pg — ABNORMAL HIGH (ref 26.0–34.0)
MCHC: 34.7 g/dL (ref 30.0–36.0)
MCV: 99 fL (ref 80.0–100.0)
Monocytes Absolute: 0.1 10*3/uL (ref 0.1–1.0)
Monocytes Relative: 7 %
Neutro Abs: 1.5 10*3/uL — ABNORMAL LOW (ref 1.7–7.7)
Neutrophils Relative %: 77 %
Platelet Count: 36 10*3/uL — ABNORMAL LOW (ref 150–400)
RBC: 2.97 MIL/uL — ABNORMAL LOW (ref 4.22–5.81)
RDW: 17.4 % — ABNORMAL HIGH (ref 11.5–15.5)
WBC Count: 1.9 10*3/uL — ABNORMAL LOW (ref 4.0–10.5)
nRBC: 0 % (ref 0.0–0.2)

## 2023-07-02 LAB — CMP (CANCER CENTER ONLY)
ALT: 13 U/L (ref 0–44)
AST: 14 U/L — ABNORMAL LOW (ref 15–41)
Albumin: 4.4 g/dL (ref 3.5–5.0)
Alkaline Phosphatase: 63 U/L (ref 38–126)
Anion gap: 3 — ABNORMAL LOW (ref 5–15)
BUN: 19 mg/dL (ref 8–23)
CO2: 30 mmol/L (ref 22–32)
Calcium: 10.1 mg/dL (ref 8.9–10.3)
Chloride: 106 mmol/L (ref 98–111)
Creatinine: 1.43 mg/dL — ABNORMAL HIGH (ref 0.61–1.24)
GFR, Estimated: 50 mL/min — ABNORMAL LOW (ref >60)
Glucose, Bld: 128 mg/dL — ABNORMAL HIGH (ref 70–99)
Potassium: 4.5 mmol/L (ref 3.5–5.1)
Sodium: 139 mmol/L (ref 135–145)
Total Bilirubin: 0.6 mg/dL (ref 0.0–1.2)
Total Protein: 6.1 g/dL — ABNORMAL LOW (ref 6.5–8.1)

## 2023-07-02 LAB — SAMPLE TO BLOOD BANK

## 2023-07-02 MED ORDER — ONDANSETRON HCL 8 MG PO TABS
8.0000 mg | ORAL_TABLET | Freq: Once | ORAL | Status: DC
Start: 2023-07-02 — End: 2023-07-02

## 2023-07-02 MED ORDER — AZACITIDINE CHEMO SQ INJECTION
75.0000 mg/m2 | Freq: Once | INTRAMUSCULAR | Status: AC
  Administered 2023-07-02: 150 mg via SUBCUTANEOUS
  Filled 2023-07-02: qty 6

## 2023-07-02 NOTE — Patient Instructions (Signed)
 [facility] - A DEPT OF [facility]  Discharge Instructions: Thank you for choosing [facility] to provide your oncology and hematology care.   If you have a lab appointment with the Cancer Center, please go directly to the Cancer Center and check in at the registration area.  Wear comfortable clothing and clothing appropriate for easy access to any Portacath or PICC line.   We strive to give you quality time with your provider. You may need to reschedule your appointment if you arrive late (15 or more minutes).  Arriving late affects you and other patients whose appointments are after yours.  Also, if you miss three or more appointments without notifying the office, you may be dismissed from the clinic at the provider's discretion.      For prescription refill requests, have your pharmacy contact our office and allow 72 hours for refills to be completed.    Today you received the following chemotherapy and/or immunotherapy agents:  Vidaza      To help prevent nausea and vomiting after your treatment, we encourage you to take your nausea medication as directed.  BELOW ARE SYMPTOMS THAT SHOULD BE REPORTED IMMEDIATELY: *FEVER GREATER THAN 100.4 F (38 C) OR HIGHER *CHILLS OR SWEATING *NAUSEA AND VOMITING THAT IS NOT CONTROLLED WITH YOUR NAUSEA MEDICATION *UNUSUAL SHORTNESS OF BREATH *UNUSUAL BRUISING OR BLEEDING *URINARY PROBLEMS (pain or burning when urinating, or frequent urination) *BOWEL PROBLEMS (unusual diarrhea, constipation, pain near the anus) TENDERNESS IN MOUTH AND THROAT WITH OR WITHOUT PRESENCE OF ULCERS (sore throat, sores in mouth, or a toothache) UNUSUAL RASH, SWELLING OR PAIN  UNUSUAL VAGINAL DISCHARGE OR ITCHING   Items with * indicate a potential emergency and should be followed up as soon as possible or go to the Emergency Department if any problems should occur.  Please show the CHEMOTHERAPY ALERT CARD or IMMUNOTHERAPY  ALERT CARD at check-in to the Emergency Department and triage nurse. Should you have questions after your visit or need to cancel or reschedule your appointment, please contact [facility] - A DEPT OF [facility]  [phone] and follow the prompts.  Office hours are 8:00 a.m. to 4:30 p.m. Monday - Friday. Please note that voicemails left after 4:00 p.m. may not be returned until the following business day.  We are closed weekends and major holidays. You have access to a nurse at all times for urgent questions. Please call the main number to the clinic [phone] and follow the prompts.  For any non-urgent questions, you may also contact your provider using MyChart. We now offer e-Visits for anyone 30 and older to request care online for non-urgent symptoms. For details visit [email or web address].   Also download the MyChart app! Go to the app store, search "MyChart", open the app, select [place], and log in with your MyChart username and password.

## 2023-07-03 ENCOUNTER — Inpatient Hospital Stay

## 2023-07-03 VITALS — BP 135/85 | HR 64 | Temp 98.0°F | Resp 17

## 2023-07-03 DIAGNOSIS — Z7901 Long term (current) use of anticoagulants: Secondary | ICD-10-CM | POA: Diagnosis not present

## 2023-07-03 DIAGNOSIS — Z79899 Other long term (current) drug therapy: Secondary | ICD-10-CM | POA: Diagnosis not present

## 2023-07-03 DIAGNOSIS — D46Z Other myelodysplastic syndromes: Secondary | ICD-10-CM

## 2023-07-03 DIAGNOSIS — Z5111 Encounter for antineoplastic chemotherapy: Secondary | ICD-10-CM | POA: Diagnosis not present

## 2023-07-03 DIAGNOSIS — D4621 Refractory anemia with excess of blasts 1: Secondary | ICD-10-CM | POA: Diagnosis not present

## 2023-07-03 MED ORDER — ONDANSETRON HCL 8 MG PO TABS: 8.0000 mg | ORAL_TABLET | Freq: Once | ORAL | Status: DC

## 2023-07-03 MED ORDER — AZACITIDINE CHEMO SQ INJECTION
75.0000 mg/m2 | Freq: Once | INTRAMUSCULAR | Status: AC
  Administered 2023-07-03: 150 mg via SUBCUTANEOUS
  Filled 2023-07-03: qty 6

## 2023-07-06 ENCOUNTER — Telehealth: Payer: Self-pay

## 2023-07-06 ENCOUNTER — Ambulatory Visit

## 2023-07-06 ENCOUNTER — Inpatient Hospital Stay

## 2023-07-06 VITALS — BP 133/70 | HR 65 | Temp 98.1°F

## 2023-07-06 DIAGNOSIS — D46Z Other myelodysplastic syndromes: Secondary | ICD-10-CM

## 2023-07-06 DIAGNOSIS — Z7901 Long term (current) use of anticoagulants: Secondary | ICD-10-CM | POA: Diagnosis not present

## 2023-07-06 DIAGNOSIS — Z79899 Other long term (current) drug therapy: Secondary | ICD-10-CM | POA: Diagnosis not present

## 2023-07-06 DIAGNOSIS — D4621 Refractory anemia with excess of blasts 1: Secondary | ICD-10-CM

## 2023-07-06 DIAGNOSIS — Z5111 Encounter for antineoplastic chemotherapy: Secondary | ICD-10-CM | POA: Diagnosis not present

## 2023-07-06 LAB — CBC WITH DIFFERENTIAL (CANCER CENTER ONLY)
Abs Immature Granulocytes: 0.08 10*3/uL — ABNORMAL HIGH (ref 0.00–0.07)
Basophils Absolute: 0 10*3/uL (ref 0.0–0.1)
Basophils Relative: 0 %
Eosinophils Absolute: 0 10*3/uL (ref 0.0–0.5)
Eosinophils Relative: 1 %
HCT: 27.7 % — ABNORMAL LOW (ref 39.0–52.0)
Hemoglobin: 9.5 g/dL — ABNORMAL LOW (ref 13.0–17.0)
Immature Granulocytes: 6 %
Lymphocytes Relative: 16 %
Lymphs Abs: 0.2 10*3/uL — ABNORMAL LOW (ref 0.7–4.0)
MCH: 34.1 pg — ABNORMAL HIGH (ref 26.0–34.0)
MCHC: 34.3 g/dL (ref 30.0–36.0)
MCV: 99.3 fL (ref 80.0–100.0)
Monocytes Absolute: 0.1 10*3/uL (ref 0.1–1.0)
Monocytes Relative: 5 %
Neutro Abs: 1 10*3/uL — ABNORMAL LOW (ref 1.7–7.7)
Neutrophils Relative %: 72 %
Platelet Count: 28 10*3/uL — ABNORMAL LOW (ref 150–400)
RBC: 2.79 MIL/uL — ABNORMAL LOW (ref 4.22–5.81)
RDW: 16.9 % — ABNORMAL HIGH (ref 11.5–15.5)
Smear Review: NORMAL
WBC Count: 1.3 10*3/uL — ABNORMAL LOW (ref 4.0–10.5)
nRBC: 0 % (ref 0.0–0.2)

## 2023-07-06 LAB — CMP (CANCER CENTER ONLY)
ALT: 28 U/L (ref 0–44)
AST: 24 U/L (ref 15–41)
Albumin: 4.4 g/dL (ref 3.5–5.0)
Alkaline Phosphatase: 58 U/L (ref 38–126)
Anion gap: 4 — ABNORMAL LOW (ref 5–15)
BUN: 19 mg/dL (ref 8–23)
CO2: 29 mmol/L (ref 22–32)
Calcium: 9.5 mg/dL (ref 8.9–10.3)
Chloride: 108 mmol/L (ref 98–111)
Creatinine: 1.3 mg/dL — ABNORMAL HIGH (ref 0.61–1.24)
GFR, Estimated: 56 mL/min — ABNORMAL LOW (ref >60)
Glucose, Bld: 125 mg/dL — ABNORMAL HIGH (ref 70–99)
Potassium: 4.6 mmol/L (ref 3.5–5.1)
Sodium: 141 mmol/L (ref 135–145)
Total Bilirubin: 0.8 mg/dL (ref 0.0–1.2)
Total Protein: 6.1 g/dL — ABNORMAL LOW (ref 6.5–8.1)

## 2023-07-06 MED ORDER — ONDANSETRON HCL 8 MG PO TABS: 8.0000 mg | ORAL_TABLET | Freq: Once | ORAL | Status: DC

## 2023-07-06 MED ORDER — AZACITIDINE CHEMO SQ INJECTION
75.0000 mg/m2 | Freq: Once | INTRAMUSCULAR | Status: AC
  Administered 2023-07-06: 150 mg via SUBCUTANEOUS
  Filled 2023-07-06: qty 6

## 2023-07-06 NOTE — Telephone Encounter (Signed)
 I spoke with patient who has started Chemotherapy treatment and his Warfarin was discontinued and he was started on Lovenox .  He will call us  if restarted on Warfarin at some point.

## 2023-07-06 NOTE — Patient Instructions (Signed)
 Azacitidine Injection What is this medication? AZACITIDINE (ay za SITE i deen) treats blood and bone marrow cancers. It works by slowing down the growth of cancer cells. This medicine may be used for other purposes; ask your health care provider or pharmacist if you have questions. COMMON BRAND NAME(S): Vidaza What should I tell my care team before I take this medication? They need to know if you have any of these conditions: Kidney disease Liver disease Low blood cell levels, such as low white cells, platelets, or red blood cells Low levels of albumin in the blood Low levels of bicarbonate in the blood An unusual or allergic reaction to azacitidine, mannitol, other medications, foods, dyes, or preservatives If you or your partner are pregnant or trying to get pregnant Breast-feeding How should I use this medication? This medication is injected into a vein or under the skin. It is given by your care team in a hospital or clinic setting. Talk to your care team about the use of this medication in children. While it may be prescribed for children as young as 1 month for selected conditions, precautions do apply. Overdosage: If you think you have taken too much of this medicine contact a poison control center or emergency room at once. NOTE: This medicine is only for you. Do not share this medicine with others. What if I miss a dose? Keep appointments for follow-up doses. It is important not to miss your dose. Call your care team if you are unable to keep an appointment. What may interact with this medication? Interactions are not expected. This list may not describe all possible interactions. Give your health care provider a list of all the medicines, herbs, non-prescription drugs, or dietary supplements you use. Also tell them if you smoke, drink alcohol, or use illegal drugs. Some items may interact with your medicine. What should I watch for while using this medication? Your condition will  be monitored carefully while you are receiving this medication. This medication may make you feel generally unwell. This is not uncommon as chemotherapy can affect healthy cells as well as cancer cells. Report any side effects. Continue your course of treatment even though you feel ill unless your care team tells you to stop. You may need blood work done while you are taking this medication. Other product types may be available that contain the medication azacitidine. The injection and oral products should not be used in place of one another. Talk to your care team if you have questions. This medication can cause serious side effects. To reduce the risk, your care team may give you other medications to take before receiving this one. Be sure to follow the directions from your care team. This medication may increase your risk of getting an infection. Call your care team for advice if you get a fever, chills, sore throat, or other symptoms of a cold or flu. Do not treat yourself. Try to avoid being around people who are sick. Avoid taking medications that contain aspirin, acetaminophen, ibuprofen, naproxen, or ketoprofen unless instructed by your care team. These medications may hide a fever. Be careful brushing or flossing your teeth or using a toothpick because you may get an infection or bleed more easily. If you have any dental work done, tell your dentist you are receiving this medication. Talk to your care team if you or your partner may be pregnant. Serious birth defects can occur if you take this medication during pregnancy and for 6 months after the  last dose. You will need a negative pregnancy test before starting this medication. Contraception is recommended while taking this medication and for 6 months after the last dose. Your care team can help you find the option that works for you. If your partner can get pregnant, use a condom during sex while taking this medication and for 3 months after the  last dose. Do not breastfeed while taking this medication and for 1 week after the last dose. This medication may cause infertility. Talk to your care team if you are concerned about your fertility. What side effects may I notice from receiving this medication? Side effects that you should report to your care team as soon as possible: Allergic reactions--skin rash, itching, hives, swelling of the face, lips, tongue, or throat Infection--fever, chills, cough, sore throat, wounds that don't heal, pain or trouble when passing urine, general feeling of discomfort or being unwell Kidney injury--decrease in the amount of urine, swelling of the ankles, hands, or feet Liver injury--right upper belly pain, loss of appetite, nausea, light-colored stool, dark yellow or brown urine, yellowing skin or eyes, unusual weakness or fatigue Low red blood cell level--unusual weakness or fatigue, dizziness, headache, trouble breathing Tumor lysis syndrome (TLS)--nausea, vomiting, diarrhea, decrease in the amount of urine, dark urine, unusual weakness or fatigue, confusion, muscle pain or cramps, fast or irregular heartbeat, joint pain Unusual bruising or bleeding Side effects that usually do not require medical attention (report to your care team if they continue or are bothersome): Constipation Diarrhea Nausea Pain, redness, or irritation at injection site Vomiting This list may not describe all possible side effects. Call your doctor for medical advice about side effects. You may report side effects to FDA at 1-800-FDA-1088. Where should I keep my medication? This medication is given in a hospital or clinic. It will not be stored at home. NOTE: This sheet is a summary. It may not cover all possible information. If you have questions about this medicine, talk to your doctor, pharmacist, or health care provider.  2024 Elsevier/Gold Standard (2022-10-13 00:00:00)

## 2023-07-06 NOTE — Telephone Encounter (Signed)
 Critical lab received from [facility] in ED. EBC count was 1.3 and platelets were 28. Dr Maria Shiner notified- no changes made.

## 2023-07-07 ENCOUNTER — Inpatient Hospital Stay

## 2023-07-07 VITALS — BP 112/62 | HR 64 | Temp 97.5°F | Resp 19

## 2023-07-07 DIAGNOSIS — Z5111 Encounter for antineoplastic chemotherapy: Secondary | ICD-10-CM | POA: Diagnosis not present

## 2023-07-07 DIAGNOSIS — Z79899 Other long term (current) drug therapy: Secondary | ICD-10-CM | POA: Diagnosis not present

## 2023-07-07 DIAGNOSIS — D4621 Refractory anemia with excess of blasts 1: Secondary | ICD-10-CM

## 2023-07-07 DIAGNOSIS — Z7901 Long term (current) use of anticoagulants: Secondary | ICD-10-CM | POA: Diagnosis not present

## 2023-07-07 DIAGNOSIS — D46Z Other myelodysplastic syndromes: Secondary | ICD-10-CM

## 2023-07-07 MED ORDER — ONDANSETRON HCL 8 MG PO TABS: 8.0000 mg | ORAL_TABLET | Freq: Once | ORAL | Status: DC

## 2023-07-07 MED ORDER — AZACITIDINE CHEMO SQ INJECTION
75.0000 mg/m2 | Freq: Once | INTRAMUSCULAR | Status: AC
  Administered 2023-07-07: 150 mg via SUBCUTANEOUS
  Filled 2023-07-07: qty 6

## 2023-07-09 ENCOUNTER — Inpatient Hospital Stay

## 2023-07-09 ENCOUNTER — Encounter: Payer: Self-pay | Admitting: Hematology & Oncology

## 2023-07-09 ENCOUNTER — Telehealth: Payer: Self-pay | Admitting: *Deleted

## 2023-07-09 DIAGNOSIS — D4621 Refractory anemia with excess of blasts 1: Secondary | ICD-10-CM | POA: Diagnosis not present

## 2023-07-09 DIAGNOSIS — Z7901 Long term (current) use of anticoagulants: Secondary | ICD-10-CM | POA: Diagnosis not present

## 2023-07-09 DIAGNOSIS — Z79899 Other long term (current) drug therapy: Secondary | ICD-10-CM | POA: Diagnosis not present

## 2023-07-09 DIAGNOSIS — Z5111 Encounter for antineoplastic chemotherapy: Secondary | ICD-10-CM | POA: Diagnosis not present

## 2023-07-09 DIAGNOSIS — D46Z Other myelodysplastic syndromes: Secondary | ICD-10-CM

## 2023-07-09 LAB — CBC WITH DIFFERENTIAL (CANCER CENTER ONLY)
Abs Immature Granulocytes: 0.02 10*3/uL (ref 0.00–0.07)
Basophils Absolute: 0 10*3/uL (ref 0.0–0.1)
Basophils Relative: 0 %
Eosinophils Absolute: 0 10*3/uL (ref 0.0–0.5)
Eosinophils Relative: 1 %
HCT: 26.7 % — ABNORMAL LOW (ref 39.0–52.0)
Hemoglobin: 9.2 g/dL — ABNORMAL LOW (ref 13.0–17.0)
Immature Granulocytes: 1 %
Lymphocytes Relative: 19 %
Lymphs Abs: 0.3 10*3/uL — ABNORMAL LOW (ref 0.7–4.0)
MCH: 33.7 pg (ref 26.0–34.0)
MCHC: 34.5 g/dL (ref 30.0–36.0)
MCV: 97.8 fL (ref 80.0–100.0)
Monocytes Absolute: 0 10*3/uL — ABNORMAL LOW (ref 0.1–1.0)
Monocytes Relative: 3 %
Neutro Abs: 1.1 10*3/uL — ABNORMAL LOW (ref 1.7–7.7)
Neutrophils Relative %: 76 %
Platelet Count: 22 10*3/uL — ABNORMAL LOW (ref 150–400)
RBC: 2.73 MIL/uL — ABNORMAL LOW (ref 4.22–5.81)
RDW: 16.7 % — ABNORMAL HIGH (ref 11.5–15.5)
Smear Review: DECREASED
WBC Count: 1.5 10*3/uL — ABNORMAL LOW (ref 4.0–10.5)
nRBC: 0 % (ref 0.0–0.2)

## 2023-07-09 LAB — CMP (CANCER CENTER ONLY)
ALT: 30 U/L (ref 0–44)
AST: 21 U/L (ref 15–41)
Albumin: 4.4 g/dL (ref 3.5–5.0)
Alkaline Phosphatase: 56 U/L (ref 38–126)
Anion gap: 4 — ABNORMAL LOW (ref 5–15)
BUN: 20 mg/dL (ref 8–23)
CO2: 29 mmol/L (ref 22–32)
Calcium: 9.6 mg/dL (ref 8.9–10.3)
Chloride: 108 mmol/L (ref 98–111)
Creatinine: 1.4 mg/dL — ABNORMAL HIGH (ref 0.61–1.24)
GFR, Estimated: 51 mL/min — ABNORMAL LOW (ref >60)
Glucose, Bld: 135 mg/dL — ABNORMAL HIGH (ref 70–99)
Potassium: 4.9 mmol/L (ref 3.5–5.1)
Sodium: 141 mmol/L (ref 135–145)
Total Bilirubin: 0.5 mg/dL (ref 0.0–1.2)
Total Protein: 6 g/dL — ABNORMAL LOW (ref 6.5–8.1)

## 2023-07-09 LAB — SAMPLE TO BLOOD BANK

## 2023-07-09 NOTE — Telephone Encounter (Signed)
 Sunnie England PA notified of WBC-1.5 and Plts-22.  No new orders received at this time.

## 2023-07-13 ENCOUNTER — Inpatient Hospital Stay

## 2023-07-13 ENCOUNTER — Encounter: Payer: Self-pay | Admitting: Hematology & Oncology

## 2023-07-13 ENCOUNTER — Telehealth: Payer: Self-pay | Admitting: *Deleted

## 2023-07-13 ENCOUNTER — Ambulatory Visit: Payer: Self-pay | Admitting: Hematology & Oncology

## 2023-07-13 DIAGNOSIS — Z5111 Encounter for antineoplastic chemotherapy: Secondary | ICD-10-CM | POA: Diagnosis not present

## 2023-07-13 DIAGNOSIS — D46Z Other myelodysplastic syndromes: Secondary | ICD-10-CM

## 2023-07-13 DIAGNOSIS — Z79899 Other long term (current) drug therapy: Secondary | ICD-10-CM | POA: Diagnosis not present

## 2023-07-13 DIAGNOSIS — D4621 Refractory anemia with excess of blasts 1: Secondary | ICD-10-CM | POA: Diagnosis not present

## 2023-07-13 DIAGNOSIS — Z7901 Long term (current) use of anticoagulants: Secondary | ICD-10-CM | POA: Diagnosis not present

## 2023-07-13 LAB — CBC WITH DIFFERENTIAL (CANCER CENTER ONLY)
Abs Immature Granulocytes: 0.01 10*3/uL (ref 0.00–0.07)
Basophils Absolute: 0 10*3/uL (ref 0.0–0.1)
Basophils Relative: 0 %
Eosinophils Absolute: 0 10*3/uL (ref 0.0–0.5)
Eosinophils Relative: 1 %
HCT: 25.6 % — ABNORMAL LOW (ref 39.0–52.0)
Hemoglobin: 9 g/dL — ABNORMAL LOW (ref 13.0–17.0)
Immature Granulocytes: 1 %
Lymphocytes Relative: 21 %
Lymphs Abs: 0.3 10*3/uL — ABNORMAL LOW (ref 0.7–4.0)
MCH: 34.1 pg — ABNORMAL HIGH (ref 26.0–34.0)
MCHC: 35.2 g/dL (ref 30.0–36.0)
MCV: 97 fL (ref 80.0–100.0)
Monocytes Absolute: 0.1 10*3/uL (ref 0.1–1.0)
Monocytes Relative: 3 %
Neutro Abs: 1.1 10*3/uL — ABNORMAL LOW (ref 1.7–7.7)
Neutrophils Relative %: 74 %
Platelet Count: 17 10*3/uL — ABNORMAL LOW (ref 150–400)
RBC: 2.64 MIL/uL — ABNORMAL LOW (ref 4.22–5.81)
RDW: 16.4 % — ABNORMAL HIGH (ref 11.5–15.5)
Smear Review: NORMAL
WBC Count: 1.5 10*3/uL — ABNORMAL LOW (ref 4.0–10.5)
nRBC: 0 % (ref 0.0–0.2)

## 2023-07-13 LAB — CMP (CANCER CENTER ONLY)
ALT: 31 U/L (ref 0–44)
AST: 19 U/L (ref 15–41)
Albumin: 4.3 g/dL (ref 3.5–5.0)
Alkaline Phosphatase: 52 U/L (ref 38–126)
Anion gap: 5 (ref 5–15)
BUN: 14 mg/dL (ref 8–23)
CO2: 28 mmol/L (ref 22–32)
Calcium: 9.4 mg/dL (ref 8.9–10.3)
Chloride: 109 mmol/L (ref 98–111)
Creatinine: 1.16 mg/dL (ref 0.61–1.24)
GFR, Estimated: >60 mL/min (ref >60)
Glucose, Bld: 158 mg/dL — ABNORMAL HIGH (ref 70–99)
Potassium: 4.7 mmol/L (ref 3.5–5.1)
Sodium: 142 mmol/L (ref 135–145)
Total Bilirubin: 0.6 mg/dL (ref 0.0–1.2)
Total Protein: 5.9 g/dL — ABNORMAL LOW (ref 6.5–8.1)

## 2023-07-13 LAB — SAMPLE TO BLOOD BANK

## 2023-07-13 NOTE — Telephone Encounter (Signed)
 Dean Every from [facility] lab called and gave me a panic Platelet count of 17. Results given to Barnie Bora.

## 2023-07-14 ENCOUNTER — Other Ambulatory Visit: Payer: Self-pay

## 2023-07-14 DIAGNOSIS — D46Z Other myelodysplastic syndromes: Secondary | ICD-10-CM

## 2023-07-14 LAB — TYPE AND SCREEN
ABO/RH(D): O POS
Antibody Screen: NEGATIVE

## 2023-07-15 ENCOUNTER — Inpatient Hospital Stay (HOSPITAL_BASED_OUTPATIENT_CLINIC_OR_DEPARTMENT_OTHER): Admitting: Hematology & Oncology

## 2023-07-15 ENCOUNTER — Encounter: Payer: Self-pay | Admitting: Hematology & Oncology

## 2023-07-15 ENCOUNTER — Inpatient Hospital Stay

## 2023-07-15 VITALS — BP 120/67 | HR 86 | Temp 97.9°F | Resp 20 | Ht 72.0 in | Wt 168.0 lb

## 2023-07-15 DIAGNOSIS — Z7901 Long term (current) use of anticoagulants: Secondary | ICD-10-CM | POA: Diagnosis not present

## 2023-07-15 DIAGNOSIS — D46Z Other myelodysplastic syndromes: Secondary | ICD-10-CM

## 2023-07-15 DIAGNOSIS — Z79899 Other long term (current) drug therapy: Secondary | ICD-10-CM | POA: Diagnosis not present

## 2023-07-15 DIAGNOSIS — D4621 Refractory anemia with excess of blasts 1: Secondary | ICD-10-CM | POA: Diagnosis not present

## 2023-07-15 DIAGNOSIS — Z5111 Encounter for antineoplastic chemotherapy: Secondary | ICD-10-CM | POA: Diagnosis not present

## 2023-07-15 MED ORDER — ACETAMINOPHEN 325 MG PO TABS
650.0000 mg | ORAL_TABLET | Freq: Once | ORAL | Status: AC
Start: 2023-07-15 — End: 2023-07-15
  Administered 2023-07-15: 650 mg via ORAL
  Filled 2023-07-15: qty 2

## 2023-07-15 MED ORDER — DIPHENHYDRAMINE HCL 25 MG PO CAPS
25.0000 mg | ORAL_CAPSULE | Freq: Once | ORAL | Status: AC
Start: 2023-07-15 — End: 2023-07-15
  Administered 2023-07-15: 25 mg via ORAL
  Filled 2023-07-15: qty 1

## 2023-07-15 NOTE — Progress Notes (Signed)
 9 Hematology and Oncology Follow Up Visit  MAYS PAINO [id number] 04/19/1940 83 y.o. 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  He will really wishes to have the IV.  We will go ahead make a change in the protocol so that he can have it IV.  I do not think he needs to have a Port-A-Cath put in at the present time.  His counts are quite low.  His platelet count is 17,000.  Given that he is on Lovenox , I think we really need to give him a platelet transfusion to get his platelet count up a little bit.  He has a mechanical heart valve.  Because of this, we really need to try to keep him on anticoagulation.  He also needs to take a trip over to [place] in June.  This is very important for him.  His part of his job.  I think that he could make it over there.  Again we will have to monitor his blood counts closely.  He has had no rashes.  He has had no cough.  He has had no fever.  He has had no obvious change in bowel or bladder habits.  There has been no leg swelling.  Overall, I would say that his performance status right now is probably ECOG 1.     Medications:  Current Outpatient Medications:    amLODipine  (NORVASC ) 5 MG tablet, TAKE ONE TABLET BY MOUTH ONE TIME DAILY, Disp: 90 tablet, Rfl: 3   atorvastatin  (LIPITOR) 10 MG tablet, TAKE ONE TABLET BY MOUTH ONE TIME DAILY, Disp: 90 tablet, Rfl: 2   enoxaparin  (LOVENOX ) 120 MG/0.8ML injection, Inject 0.8 mLs (120 mg total) into the skin daily., Disp: 24 mL, Rfl: 12   famciclovir  (FAMVIR ) 250 MG tablet, Take 1 tablet (250 mg total) by mouth daily., Disp: 30 tablet, Rfl: 12   hydrocortisone 2.5 % cream, Apply 1 Application topically daily as needed., Disp: , Rfl:     itraconazole  (SPORANOX ) 100 MG capsule, Take 1 capsule (100 mg total) by mouth daily., Disp: 30 capsule, Rfl: 6   levofloxacin  (LEVAQUIN ) 250 MG tablet, Take 1 tablet (250 mg total) by mouth daily., Disp: 30 tablet, Rfl: 4   ondansetron  (ZOFRAN ) 8 MG tablet, Take 1 tablet (8 mg total) by mouth every 8 (eight) hours as needed for nausea or vomiting., Disp: 30 tablet, Rfl: 1   pantoprazole  (PROTONIX ) 40 MG tablet, TAKE ONE TABLET BY MOUTH ONE TIME DAILY (MUST KEEP 06/24/2022 APPOINTMENT FOR FUTURE REFILLS), Disp: 30 tablet, Rfl: 0   tadalafil  (CIALIS ) 5 MG tablet, Take 1 tablet (5 mg total) by mouth daily., Disp: 90 tablet, Rfl: 3   diphenoxylate -atropine  (LOMOTIL ) 2.5-0.025 MG tablet, Take 1 tablet by mouth 4 (four) times daily as needed for diarrhea or loose stools. (Patient not taking: Reported on 07/15/2023), Disp: 60 tablet, Rfl: 1   prochlorperazine  (COMPAZINE ) 10 MG tablet, Take 1 tablet (10 mg total) by mouth every 6 (six) hours as needed for nausea or vomiting. (Patient not taking: Reported on 07/15/2023), Disp: 30 tablet, Rfl: 1   SUMAtriptan  (IMITREX ) 25 MG tablet, TAKE ONE TABLET BY MOUTH AT ONSET OF HEADACHE. MAY REPEAT DOSE WITH ONE TABLET  IN TWO HOURS IF NEEDED. DO NOT EXCEED TWO TABLETS IN 24 HOURS. (Patient not taking: Reported on 07/15/2023), Disp: 12 tablet, Rfl: 0  Allergies:  Allergies  Allergen Reactions   Codeine  Rash   Terbinafine Itching    Past Medical History, Surgical history, Social history, and Family History were reviewed and updated.  Review of Systems: Review of Systems  Constitutional: Negative.   HENT:  Negative.    Eyes: Negative.   Respiratory: Negative.    Cardiovascular: Negative.   Gastrointestinal: Negative.   Endocrine: Negative.   Genitourinary: Negative.    Musculoskeletal: Negative.   Skin: Negative.   Neurological: Negative.   Hematological: Negative.   Psychiatric/Behavioral: Negative.      Physical Exam:  height is 6' (1.829 m) and weight  is 168 lb (76.2 kg). His oral temperature is 97.9 F (36.6 C). His blood pressure is 120/67 and his pulse is 86. His respiration is 20 and oxygen saturation is 99%.   Wt Readings from Last 3 Encounters:  07/15/23 168 lb (76.2 kg)  06/11/23 172 lb 6.4 oz (78.2 kg)  04/30/23 174 lb 6.4 oz (79.1 kg)    Physical Exam Vitals reviewed.  HENT:     Head: Normocephalic and atraumatic.  Eyes:     Pupils: Pupils are equal, round, and reactive to light.     Comments: Ocular exam does show the conjunctival hemorrhage with the right eye.  Pupil reacts appropriately.  He has good extraocular muscle movement.  Cardiovascular:     Rate and Rhythm: Normal rate and regular rhythm.     Heart sounds: Normal heart sounds.     Comments: Cardiac exam shows a regular rate and rhythm.  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  I hear no murmurs or rubs or bruits. Pulmonary:     Effort: Pulmonary effort is normal.     Breath sounds: Normal breath sounds.  Abdominal:     General: Bowel sounds are normal.     Palpations: Abdomen is soft.  Musculoskeletal:        General: No tenderness or deformity. Normal range of motion.     Cervical back: Normal range of motion.  Lymphadenopathy:     Cervical: No cervical adenopathy.  Skin:    General: Skin is warm and dry.     Findings: No erythema or rash.  Neurological:     Mental Status: He is alert and oriented to person, place, and time.  Psychiatric:        Behavior: Behavior normal.        Thought Content: Thought content normal.        Judgment: Judgment normal.     Lab Results  Component Value Date   WBC 1.5 (L) 07/13/2023   HGB 9.0 (L) 07/13/2023   HCT 25.6 (L) 07/13/2023   MCV 97.0 07/13/2023   PLT 17 (L) 07/13/2023     Chemistry      Component Value Date/Time   NA 142 07/13/2023 0957   NA 139 11/12/2020 1030   K 4.7 07/13/2023 0957   CL 109 07/13/2023 0957   CO2 28 07/13/2023 0957   BUN 14 07/13/2023 0957   BUN 16  11/12/2020 1030   CREATININE 1.16 07/13/2023 0957      Component Value Date/Time   CALCIUM  9.4 07/13/2023 0957   ALKPHOS 52 07/13/2023 0957   AST 19 07/13/2023 0957   ALT 31 07/13/2023 0957   BILITOT 0.6 07/13/2023 0957  Impression and Plan: Mr. Toney is a 83 year old white male.  He has mild leukopenia and thrombocytopenia. I did do a bone marrow biopsy on him.  This confirmed that he has myelodysplasia.  Again, I would classify him as refractory anemia with excess blast-1  (RAEB-1).  We now have him on treatment.  We have waited for a while before we had to treat him.  Again, surprised by his blood counts.  We clearly need to have him come back twice week to have labs checked.  Again with him being on Lovenox , we really need him maintain close vigilance with his blood counts, but in particular the platelets.  Otherwise, I really think he is done nicely.  We will plan to get him back to see us  on I think June 2 when he starts his second cycle of treatment.  Still would not plan for any bone marrow follow-up until after he has had 4 cycles of treatment.   Ivor Mars, MD 5/21/20259:04 AM

## 2023-07-16 ENCOUNTER — Encounter: Payer: Self-pay | Admitting: Hematology & Oncology

## 2023-07-16 ENCOUNTER — Other Ambulatory Visit: Payer: Self-pay | Admitting: Internal Medicine

## 2023-07-16 ENCOUNTER — Inpatient Hospital Stay

## 2023-07-16 ENCOUNTER — Telehealth: Payer: Self-pay

## 2023-07-16 DIAGNOSIS — Z79899 Other long term (current) drug therapy: Secondary | ICD-10-CM | POA: Diagnosis not present

## 2023-07-16 DIAGNOSIS — Z7901 Long term (current) use of anticoagulants: Secondary | ICD-10-CM | POA: Diagnosis not present

## 2023-07-16 DIAGNOSIS — D4621 Refractory anemia with excess of blasts 1: Secondary | ICD-10-CM

## 2023-07-16 DIAGNOSIS — D46Z Other myelodysplastic syndromes: Secondary | ICD-10-CM

## 2023-07-16 DIAGNOSIS — Z5111 Encounter for antineoplastic chemotherapy: Secondary | ICD-10-CM | POA: Diagnosis not present

## 2023-07-16 LAB — CMP (CANCER CENTER ONLY)
ALT: 32 U/L (ref 0–44)
AST: 17 U/L (ref 15–41)
Albumin: 4.3 g/dL (ref 3.5–5.0)
Alkaline Phosphatase: 57 U/L (ref 38–126)
Anion gap: 6 (ref 5–15)
BUN: 12 mg/dL (ref 8–23)
CO2: 27 mmol/L (ref 22–32)
Calcium: 9.5 mg/dL (ref 8.9–10.3)
Chloride: 108 mmol/L (ref 98–111)
Creatinine: 1.17 mg/dL (ref 0.61–1.24)
GFR, Estimated: >60 mL/min (ref >60)
Glucose, Bld: 174 mg/dL — ABNORMAL HIGH (ref 70–99)
Potassium: 4.5 mmol/L (ref 3.5–5.1)
Sodium: 141 mmol/L (ref 135–145)
Total Bilirubin: 0.6 mg/dL (ref 0.0–1.2)
Total Protein: 5.7 g/dL — ABNORMAL LOW (ref 6.5–8.1)

## 2023-07-16 LAB — CBC WITH DIFFERENTIAL (CANCER CENTER ONLY)
Abs Immature Granulocytes: 0.03 10*3/uL (ref 0.00–0.07)
Basophils Absolute: 0 10*3/uL (ref 0.0–0.1)
Basophils Relative: 0 %
Eosinophils Absolute: 0 10*3/uL (ref 0.0–0.5)
Eosinophils Relative: 1 %
HCT: 24 % — ABNORMAL LOW (ref 39.0–52.0)
Hemoglobin: 8.4 g/dL — ABNORMAL LOW (ref 13.0–17.0)
Immature Granulocytes: 3 %
Lymphocytes Relative: 24 %
Lymphs Abs: 0.3 10*3/uL — ABNORMAL LOW (ref 0.7–4.0)
MCH: 34.4 pg — ABNORMAL HIGH (ref 26.0–34.0)
MCHC: 35 g/dL (ref 30.0–36.0)
MCV: 98.4 fL (ref 80.0–100.0)
Monocytes Absolute: 0.1 10*3/uL (ref 0.1–1.0)
Monocytes Relative: 6 %
Neutro Abs: 0.8 10*3/uL — ABNORMAL LOW (ref 1.7–7.7)
Neutrophils Relative %: 66 %
Platelet Count: 27 10*3/uL — ABNORMAL LOW (ref 150–400)
RBC: 2.44 MIL/uL — ABNORMAL LOW (ref 4.22–5.81)
RDW: 16.9 % — ABNORMAL HIGH (ref 11.5–15.5)
Smear Review: NORMAL
WBC Count: 1.2 10*3/uL — ABNORMAL LOW (ref 4.0–10.5)
WBC Morphology: ABNORMAL
nRBC: 0 % (ref 0.0–0.2)

## 2023-07-16 LAB — PREPARE PLATELET PHERESIS: Unit division: 0

## 2023-07-16 LAB — BPAM PLATELET PHERESIS
Blood Product Expiration Date: 202505222359
ISSUE DATE / TIME: 202505210753
Unit Type and Rh: 7300

## 2023-07-16 LAB — SAMPLE TO BLOOD BANK

## 2023-07-16 NOTE — Telephone Encounter (Signed)
 Critical result received from lab of WBC 1.2 and platelets 27. Dr. Maria Shiner aware and no new orders received.

## 2023-07-17 ENCOUNTER — Other Ambulatory Visit: Payer: Self-pay

## 2023-07-19 ENCOUNTER — Encounter: Payer: Self-pay | Admitting: Hematology & Oncology

## 2023-07-21 ENCOUNTER — Inpatient Hospital Stay

## 2023-07-21 ENCOUNTER — Encounter: Payer: Self-pay | Admitting: Cardiovascular Disease

## 2023-07-21 ENCOUNTER — Telehealth: Payer: Self-pay

## 2023-07-21 DIAGNOSIS — D4621 Refractory anemia with excess of blasts 1: Secondary | ICD-10-CM | POA: Diagnosis not present

## 2023-07-21 DIAGNOSIS — Z7901 Long term (current) use of anticoagulants: Secondary | ICD-10-CM | POA: Diagnosis not present

## 2023-07-21 DIAGNOSIS — Z5111 Encounter for antineoplastic chemotherapy: Secondary | ICD-10-CM | POA: Diagnosis not present

## 2023-07-21 DIAGNOSIS — D46Z Other myelodysplastic syndromes: Secondary | ICD-10-CM

## 2023-07-21 DIAGNOSIS — Z79899 Other long term (current) drug therapy: Secondary | ICD-10-CM | POA: Diagnosis not present

## 2023-07-21 LAB — CBC WITH DIFFERENTIAL (CANCER CENTER ONLY)
Abs Immature Granulocytes: 0 10*3/uL (ref 0.00–0.07)
Basophils Absolute: 0 10*3/uL (ref 0.0–0.1)
Basophils Relative: 1 %
Eosinophils Absolute: 0 10*3/uL (ref 0.0–0.5)
Eosinophils Relative: 0 %
HCT: 24.8 % — ABNORMAL LOW (ref 39.0–52.0)
Hemoglobin: 8.5 g/dL — ABNORMAL LOW (ref 13.0–17.0)
Immature Granulocytes: 0 %
Lymphocytes Relative: 35 %
Lymphs Abs: 0.3 10*3/uL — ABNORMAL LOW (ref 0.7–4.0)
MCH: 34.1 pg — ABNORMAL HIGH (ref 26.0–34.0)
MCHC: 34.3 g/dL (ref 30.0–36.0)
MCV: 99.6 fL (ref 80.0–100.0)
Monocytes Absolute: 0.1 10*3/uL (ref 0.1–1.0)
Monocytes Relative: 7 %
Neutro Abs: 0.4 10*3/uL — CL (ref 1.7–7.7)
Neutrophils Relative %: 57 %
Platelet Count: 41 10*3/uL — ABNORMAL LOW (ref 150–400)
RBC: 2.49 MIL/uL — ABNORMAL LOW (ref 4.22–5.81)
RDW: 18.1 % — ABNORMAL HIGH (ref 11.5–15.5)
Smear Review: NORMAL
WBC Count: 0.8 10*3/uL — CL (ref 4.0–10.5)
WBC Morphology: ABNORMAL
nRBC: 0 % (ref 0.0–0.2)

## 2023-07-21 LAB — CMP (CANCER CENTER ONLY)
ALT: 23 U/L (ref 0–44)
AST: 14 U/L — ABNORMAL LOW (ref 15–41)
Albumin: 4.4 g/dL (ref 3.5–5.0)
Alkaline Phosphatase: 51 U/L (ref 38–126)
Anion gap: 5 (ref 5–15)
BUN: 14 mg/dL (ref 8–23)
CO2: 30 mmol/L (ref 22–32)
Calcium: 9.4 mg/dL (ref 8.9–10.3)
Chloride: 107 mmol/L (ref 98–111)
Creatinine: 1.23 mg/dL (ref 0.61–1.24)
GFR, Estimated: 59 mL/min — ABNORMAL LOW (ref >60)
Glucose, Bld: 153 mg/dL — ABNORMAL HIGH (ref 70–99)
Potassium: 4.7 mmol/L (ref 3.5–5.1)
Sodium: 142 mmol/L (ref 135–145)
Total Bilirubin: 0.6 mg/dL (ref 0.0–1.2)
Total Protein: 5.6 g/dL — ABNORMAL LOW (ref 6.5–8.1)

## 2023-07-21 NOTE — Telephone Encounter (Signed)
 Critical WBC 0.8 and ANC of 0.4, MD notified

## 2023-07-24 ENCOUNTER — Telehealth: Payer: Self-pay

## 2023-07-24 ENCOUNTER — Inpatient Hospital Stay

## 2023-07-24 DIAGNOSIS — Z79899 Other long term (current) drug therapy: Secondary | ICD-10-CM | POA: Diagnosis not present

## 2023-07-24 DIAGNOSIS — Z7901 Long term (current) use of anticoagulants: Secondary | ICD-10-CM | POA: Diagnosis not present

## 2023-07-24 DIAGNOSIS — Z5111 Encounter for antineoplastic chemotherapy: Secondary | ICD-10-CM | POA: Diagnosis not present

## 2023-07-24 DIAGNOSIS — D46Z Other myelodysplastic syndromes: Secondary | ICD-10-CM

## 2023-07-24 DIAGNOSIS — D4621 Refractory anemia with excess of blasts 1: Secondary | ICD-10-CM | POA: Diagnosis not present

## 2023-07-24 LAB — CBC WITH DIFFERENTIAL (CANCER CENTER ONLY)
Abs Immature Granulocytes: 0.01 10*3/uL (ref 0.00–0.07)
Basophils Absolute: 0 10*3/uL (ref 0.0–0.1)
Basophils Relative: 0 %
Eosinophils Absolute: 0 10*3/uL (ref 0.0–0.5)
Eosinophils Relative: 1 %
HCT: 26.6 % — ABNORMAL LOW (ref 39.0–52.0)
Hemoglobin: 9 g/dL — ABNORMAL LOW (ref 13.0–17.0)
Immature Granulocytes: 1 %
Lymphocytes Relative: 35 %
Lymphs Abs: 0.3 10*3/uL — ABNORMAL LOW (ref 0.7–4.0)
MCH: 34.1 pg — ABNORMAL HIGH (ref 26.0–34.0)
MCHC: 33.8 g/dL (ref 30.0–36.0)
MCV: 100.8 fL — ABNORMAL HIGH (ref 80.0–100.0)
Monocytes Absolute: 0.1 10*3/uL (ref 0.1–1.0)
Monocytes Relative: 6 %
Neutro Abs: 0.5 10*3/uL — ABNORMAL LOW (ref 1.7–7.7)
Neutrophils Relative %: 57 %
Platelet Count: 56 10*3/uL — ABNORMAL LOW (ref 150–400)
RBC: 2.64 MIL/uL — ABNORMAL LOW (ref 4.22–5.81)
RDW: 18.4 % — ABNORMAL HIGH (ref 11.5–15.5)
WBC Count: 0.9 10*3/uL — CL (ref 4.0–10.5)
nRBC: 0 % (ref 0.0–0.2)

## 2023-07-24 LAB — CMP (CANCER CENTER ONLY)
ALT: 26 U/L (ref 0–44)
AST: 18 U/L (ref 15–41)
Albumin: 4.6 g/dL (ref 3.5–5.0)
Alkaline Phosphatase: 49 U/L (ref 38–126)
Anion gap: 5 (ref 5–15)
BUN: 17 mg/dL (ref 8–23)
CO2: 28 mmol/L (ref 22–32)
Calcium: 9.2 mg/dL (ref 8.9–10.3)
Chloride: 106 mmol/L (ref 98–111)
Creatinine: 1.2 mg/dL (ref 0.61–1.24)
GFR, Estimated: >60 mL/min (ref >60)
Glucose, Bld: 159 mg/dL — ABNORMAL HIGH (ref 70–99)
Potassium: 4.7 mmol/L (ref 3.5–5.1)
Sodium: 139 mmol/L (ref 135–145)
Total Bilirubin: 0.6 mg/dL (ref 0.0–1.2)
Total Protein: 6.3 g/dL — ABNORMAL LOW (ref 6.5–8.1)

## 2023-07-24 LAB — SAMPLE TO BLOOD BANK

## 2023-07-24 NOTE — Telephone Encounter (Signed)
 Critical lab received from [facility] in ED lab. Pt's WBC count was 0.9 and ANC was 0.4. Dr Maria Shiner made aware and declines any changes at this time. Pt to f/u 07/27/23.

## 2023-07-27 ENCOUNTER — Other Ambulatory Visit: Payer: Self-pay

## 2023-07-27 ENCOUNTER — Inpatient Hospital Stay

## 2023-07-27 ENCOUNTER — Encounter: Payer: Self-pay | Admitting: Hematology & Oncology

## 2023-07-27 ENCOUNTER — Telehealth: Payer: Self-pay | Admitting: *Deleted

## 2023-07-27 ENCOUNTER — Inpatient Hospital Stay: Attending: Hematology & Oncology

## 2023-07-27 ENCOUNTER — Inpatient Hospital Stay (HOSPITAL_BASED_OUTPATIENT_CLINIC_OR_DEPARTMENT_OTHER): Admitting: Hematology & Oncology

## 2023-07-27 VITALS — BP 123/55 | HR 70 | Temp 97.4°F | Resp 18 | Ht 72.0 in | Wt 170.0 lb

## 2023-07-27 VITALS — BP 115/63 | HR 55 | Resp 17

## 2023-07-27 DIAGNOSIS — D46Z Other myelodysplastic syndromes: Secondary | ICD-10-CM

## 2023-07-27 DIAGNOSIS — D4621 Refractory anemia with excess of blasts 1: Secondary | ICD-10-CM | POA: Insufficient documentation

## 2023-07-27 DIAGNOSIS — Z5111 Encounter for antineoplastic chemotherapy: Secondary | ICD-10-CM | POA: Diagnosis not present

## 2023-07-27 DIAGNOSIS — Z79899 Other long term (current) drug therapy: Secondary | ICD-10-CM | POA: Diagnosis not present

## 2023-07-27 LAB — CMP (CANCER CENTER ONLY)
ALT: 19 U/L (ref 0–44)
AST: 15 U/L (ref 15–41)
Albumin: 4.5 g/dL (ref 3.5–5.0)
Alkaline Phosphatase: 47 U/L (ref 38–126)
Anion gap: 6 (ref 5–15)
BUN: 17 mg/dL (ref 8–23)
CO2: 27 mmol/L (ref 22–32)
Calcium: 9.3 mg/dL (ref 8.9–10.3)
Chloride: 107 mmol/L (ref 98–111)
Creatinine: 1.25 mg/dL — ABNORMAL HIGH (ref 0.61–1.24)
GFR, Estimated: 58 mL/min — ABNORMAL LOW (ref >60)
Glucose, Bld: 191 mg/dL — ABNORMAL HIGH (ref 70–99)
Potassium: 4.7 mmol/L (ref 3.5–5.1)
Sodium: 140 mmol/L (ref 135–145)
Total Bilirubin: 0.6 mg/dL (ref 0.0–1.2)
Total Protein: 6.2 g/dL — ABNORMAL LOW (ref 6.5–8.1)

## 2023-07-27 LAB — CBC WITH DIFFERENTIAL (CANCER CENTER ONLY)
Abs Immature Granulocytes: 0 10*3/uL (ref 0.00–0.07)
Basophils Absolute: 0 10*3/uL (ref 0.0–0.1)
Basophils Relative: 0 %
Eosinophils Absolute: 0 10*3/uL (ref 0.0–0.5)
Eosinophils Relative: 5 %
HCT: 27.7 % — ABNORMAL LOW (ref 39.0–52.0)
Hemoglobin: 9.4 g/dL — ABNORMAL LOW (ref 13.0–17.0)
Immature Granulocytes: 0 %
Lymphocytes Relative: 37 %
Lymphs Abs: 0.3 10*3/uL — ABNORMAL LOW (ref 0.7–4.0)
MCH: 34.3 pg — ABNORMAL HIGH (ref 26.0–34.0)
MCHC: 33.9 g/dL (ref 30.0–36.0)
MCV: 101.1 fL — ABNORMAL HIGH (ref 80.0–100.0)
Monocytes Absolute: 0 10*3/uL — ABNORMAL LOW (ref 0.1–1.0)
Monocytes Relative: 3 %
Neutro Abs: 0.4 10*3/uL — CL (ref 1.7–7.7)
Neutrophils Relative %: 55 %
Platelet Count: 70 10*3/uL — ABNORMAL LOW (ref 150–400)
RBC: 2.74 MIL/uL — ABNORMAL LOW (ref 4.22–5.81)
RDW: 18.7 % — ABNORMAL HIGH (ref 11.5–15.5)
Smear Review: NORMAL
WBC Count: 0.8 10*3/uL — CL (ref 4.0–10.5)
nRBC: 0 % (ref 0.0–0.2)

## 2023-07-27 LAB — SAVE SMEAR(SSMR), FOR PROVIDER SLIDE REVIEW

## 2023-07-27 LAB — LACTATE DEHYDROGENASE: LDH: 209 U/L — ABNORMAL HIGH (ref 98–192)

## 2023-07-27 LAB — SAMPLE TO BLOOD BANK

## 2023-07-27 MED ORDER — ONDANSETRON HCL 8 MG PO TABS
8.0000 mg | ORAL_TABLET | Freq: Once | ORAL | Status: DC
Start: 2023-07-27 — End: 2023-07-27

## 2023-07-27 MED ORDER — SODIUM CHLORIDE 0.9 % IV SOLN
75.0000 mg/m2 | Freq: Once | INTRAVENOUS | Status: AC
  Administered 2023-07-27: 148 mg via INTRAVENOUS
  Filled 2023-07-27: qty 14.8

## 2023-07-27 MED ORDER — SODIUM CHLORIDE 0.9 % IV SOLN: INTRAVENOUS | Status: DC

## 2023-07-27 NOTE — Patient Instructions (Signed)
 [facility] - A DEPT OF [facility]  Discharge Instructions: Thank you for choosing [facility] to provide your oncology and hematology care.   If you have a lab appointment with the Cancer Center, please go directly to the Cancer Center and check in at the registration area.  Wear comfortable clothing and clothing appropriate for easy access to any Portacath or PICC line.   We strive to give you quality time with your provider. You may need to reschedule your appointment if you arrive late (15 or more minutes).  Arriving late affects you and other patients whose appointments are after yours.  Also, if you miss three or more appointments without notifying the office, you may be dismissed from the clinic at the provider's discretion.      For prescription refill requests, have your pharmacy contact our office and allow 72 hours for refills to be completed.    Today you received the following chemotherapy and/or immunotherapy agents:  Vidaza      To help prevent nausea and vomiting after your treatment, we encourage you to take your nausea medication as directed.  BELOW ARE SYMPTOMS THAT SHOULD BE REPORTED IMMEDIATELY: *FEVER GREATER THAN 100.4 F (38 C) OR HIGHER *CHILLS OR SWEATING *NAUSEA AND VOMITING THAT IS NOT CONTROLLED WITH YOUR NAUSEA MEDICATION *UNUSUAL SHORTNESS OF BREATH *UNUSUAL BRUISING OR BLEEDING *URINARY PROBLEMS (pain or burning when urinating, or frequent urination) *BOWEL PROBLEMS (unusual diarrhea, constipation, pain near the anus) TENDERNESS IN MOUTH AND THROAT WITH OR WITHOUT PRESENCE OF ULCERS (sore throat, sores in mouth, or a toothache) UNUSUAL RASH, SWELLING OR PAIN  UNUSUAL VAGINAL DISCHARGE OR ITCHING   Items with * indicate a potential emergency and should be followed up as soon as possible or go to the Emergency Department if any problems should occur.  Please show the CHEMOTHERAPY ALERT CARD or IMMUNOTHERAPY  ALERT CARD at check-in to the Emergency Department and triage nurse. Should you have questions after your visit or need to cancel or reschedule your appointment, please contact [facility] - A DEPT OF [facility]  [phone] and follow the prompts.  Office hours are 8:00 a.m. to 4:30 p.m. Monday - Friday. Please note that voicemails left after 4:00 p.m. may not be returned until the following business day.  We are closed weekends and major holidays. You have access to a nurse at all times for urgent questions. Please call the main number to the clinic [phone] and follow the prompts.  For any non-urgent questions, you may also contact your provider using MyChart. We now offer e-Visits for anyone 30 and older to request care online for non-urgent symptoms. For details visit [email or web address].   Also download the MyChart app! Go to the app store, search "MyChart", open the app, select [place], and log in with your MyChart username and password.

## 2023-07-27 NOTE — Progress Notes (Signed)
 Ok to treat with today's CBC and CMET results per order of Dr. Maria Shiner.

## 2023-07-27 NOTE — Telephone Encounter (Signed)
 Critical WBC of .8 and ANC of .4 reported by Dean Every in lab.  Dr Maria Shiner seeing patient in exam room today.  No orders received

## 2023-07-27 NOTE — Progress Notes (Signed)
 9 Hematology and Oncology Follow Up Visit  Patrick [name] [id number] 14-Mar-1940 83 y.o. 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  Today would be a second cycle.  He wishes to think about doing treatment IV.  We will see about doing this peripherally right now.  Thankfully, he is not going to be going to [place].  I understand the reason for not wanting to go.  He has had no bleeding.  He is on Lovenox  because of the mechanical heart valve.  He has done well with this.  Surprisingly, his platelets actually have gotten better.  I think we see his platelets continue to improve, we can switch him back over to Coumadin .  He has had no fever.  He has had no rashes.  He has had no change in bowel or bladder habits.  He has had no cough or shortness of breath.  He has had no leg swelling.  Overall, I would have to say that his performance status about ECOG 1.      Medications:  Current Outpatient Medications:    amLODipine  (NORVASC ) 5 MG tablet, TAKE ONE TABLET BY MOUTH ONE TIME DAILY, Disp: 90 tablet, Rfl: 3   atorvastatin  (LIPITOR) 10 MG tablet, TAKE ONE TABLET BY MOUTH ONE TIME DAILY, Disp: 90 tablet, Rfl: 2   diphenoxylate -atropine  (LOMOTIL ) 2.5-0.025 MG tablet, Take 1 tablet by mouth 4 (four) times daily as needed for diarrhea or loose stools. (Patient not taking: Reported on 07/15/2023), Disp: 60 tablet, Rfl: 1   enoxaparin  (LOVENOX ) 120 MG/0.8ML injection, Inject 0.8 mLs (120 mg total) into the skin daily., Disp: 24 mL, Rfl: 12   famciclovir  (FAMVIR ) 250 MG tablet, Take 1 tablet (250 mg total) by mouth daily., Disp: 30 tablet, Rfl: 12   hydrocortisone 2.5 % cream, Apply 1 Application topically daily as needed., Disp: ,  Rfl:    itraconazole  (SPORANOX ) 100 MG capsule, Take 1 capsule (100 mg total) by mouth daily., Disp: 30 capsule, Rfl: 6   levofloxacin  (LEVAQUIN ) 250 MG tablet, Take 1 tablet (250 mg total) by mouth daily., Disp: 30 tablet, Rfl: 4   pantoprazole  (PROTONIX ) 40 MG tablet, TAKE ONE TABLET BY MOUTH ONE TIME DAILY, Disp: 90 tablet, Rfl: 3   SUMAtriptan  (IMITREX ) 25 MG tablet, TAKE ONE TABLET BY MOUTH AT ONSET OF HEADACHE. MAY REPEAT DOSE WITH ONE TABLET IN TWO HOURS IF NEEDED. DO NOT EXCEED TWO TABLETS IN 24 HOURS. (Patient not taking: Reported on 07/15/2023), Disp: 12 tablet, Rfl: 0   tadalafil  (CIALIS ) 5 MG tablet, Take 1 tablet (5 mg total) by mouth daily., Disp: 90 tablet, Rfl: 3  Allergies:  Allergies  Allergen Reactions   Codeine  Rash   Terbinafine Itching    Past Medical History, Surgical history, Social history, and Family History were reviewed and updated.  Review of Systems: Review of Systems  Constitutional: Negative.   HENT:  Negative.    Eyes: Negative.   Respiratory: Negative.    Cardiovascular: Negative.   Gastrointestinal: Negative.   Endocrine: Negative.   Genitourinary: Negative.    Musculoskeletal: Negative.   Skin: Negative.   Neurological: Negative.   Hematological: Negative.   Psychiatric/Behavioral: Negative.      Physical  Exam:  height is 6' (1.829 m) and weight is 170 lb (77.1 kg). His oral temperature is 97.4 F (36.3 C) (abnormal). His blood pressure is 123/55 (abnormal) and his pulse is 70. His respiration is 18 and oxygen saturation is 100%.   Wt Readings from Last 3 Encounters:  07/27/23 170 lb (77.1 kg)  07/15/23 168 lb (76.2 kg)  06/11/23 172 lb 6.4 oz (78.2 kg)    Physical Exam Vitals reviewed.  HENT:     Head: Normocephalic and atraumatic.  Eyes:     Pupils: Pupils are equal, round, and reactive to light.     Comments: Ocular exam does show the conjunctival hemorrhage with the right eye.  Pupil reacts appropriately.  He has good extraocular  muscle movement.  Cardiovascular:     Rate and Rhythm: Normal rate and regular rhythm.     Heart sounds: Normal heart sounds.     Comments: Cardiac exam shows a regular rate and rhythm.  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  I hear no murmurs or rubs or bruits. Pulmonary:     Effort: Pulmonary effort is normal.     Breath sounds: Normal breath sounds.  Abdominal:     General: Bowel sounds are normal.     Palpations: Abdomen is soft.  Musculoskeletal:        General: No tenderness or deformity. Normal range of motion.     Cervical back: Normal range of motion.  Lymphadenopathy:     Cervical: No cervical adenopathy.  Skin:    General: Skin is warm and dry.     Findings: No erythema or rash.  Neurological:     Mental Status: He is alert and oriented to person, place, and time.  Psychiatric:        Behavior: Behavior normal.        Thought Content: Thought content normal.        Judgment: Judgment normal.     Lab Results  Component Value Date   WBC 0.8 (LL) 07/27/2023   HGB 9.4 (L) 07/27/2023   HCT 27.7 (L) 07/27/2023   MCV 101.1 (H) 07/27/2023   PLT 70 (L) 07/27/2023     Chemistry      Component Value Date/Time   NA 140 07/27/2023 1004   NA 139 11/12/2020 1030   K 4.7 07/27/2023 1004   CL 107 07/27/2023 1004   CO2 27 07/27/2023 1004   BUN 17 07/27/2023 1004   BUN 16 11/12/2020 1030   CREATININE 1.25 (H) 07/27/2023 1004      Component Value Date/Time   CALCIUM  9.3 07/27/2023 1004   ALKPHOS 47 07/27/2023 1004   AST 15 07/27/2023 1004   ALT 19 07/27/2023 1004   BILITOT 0.6 07/27/2023 1004       Impression and Plan: [name] is a 83 year old white male.  He has mild leukopenia and thrombocytopenia. I did do a bone marrow biopsy on him.  This confirmed that he has myelodysplasia.  Again, I would classify him as refractory anemia with excess blast-1  (RAEB-1).  I would go ahead with his second cycle of treatment.  Again we will try to  do the Vidaza  IV.  Hopefully, his peripheral veins will be able to manage this.  We still follow his labs twice a week.  I still think we are going to have to do this.  Again, after 4 cycles, I would then repeat his bone marrow.  Hopefully, his platelet count  is showing us  that the bone marrow is improving a little bit.  I will plan to see him back myself in another month.  Again comes in twice a week for labs.   Ivor Mars, MD 6/2/202512:05 PM

## 2023-07-28 ENCOUNTER — Inpatient Hospital Stay

## 2023-07-28 VITALS — BP 127/89 | HR 65 | Temp 97.6°F | Resp 18

## 2023-07-28 DIAGNOSIS — D4621 Refractory anemia with excess of blasts 1: Secondary | ICD-10-CM | POA: Diagnosis not present

## 2023-07-28 DIAGNOSIS — Z79899 Other long term (current) drug therapy: Secondary | ICD-10-CM | POA: Diagnosis not present

## 2023-07-28 DIAGNOSIS — Z5111 Encounter for antineoplastic chemotherapy: Secondary | ICD-10-CM | POA: Diagnosis not present

## 2023-07-28 DIAGNOSIS — D46Z Other myelodysplastic syndromes: Secondary | ICD-10-CM

## 2023-07-28 MED ORDER — ONDANSETRON HCL 8 MG PO TABS
8.0000 mg | ORAL_TABLET | Freq: Once | ORAL | Status: DC
Start: 2023-07-28 — End: 2023-07-28

## 2023-07-28 MED ORDER — SODIUM CHLORIDE 0.9 % IV SOLN
75.0000 mg/m2 | Freq: Once | INTRAVENOUS | Status: AC
  Administered 2023-07-28: 148 mg via INTRAVENOUS
  Filled 2023-07-28: qty 14.8

## 2023-07-28 MED ORDER — SODIUM CHLORIDE 0.9 % IV SOLN: INTRAVENOUS | Status: DC

## 2023-07-28 NOTE — Patient Instructions (Signed)
 Azacitidine Injection What is this medication? AZACITIDINE (ay za SITE i deen) treats blood and bone marrow cancers. It works by slowing down the growth of cancer cells. This medicine may be used for other purposes; ask your health care provider or pharmacist if you have questions. COMMON BRAND NAME(S): Vidaza What should I tell my care team before I take this medication? They need to know if you have any of these conditions: Kidney disease Liver disease Low blood cell levels, such as low white cells, platelets, or red blood cells Low levels of albumin in the blood Low levels of bicarbonate in the blood An unusual or allergic reaction to azacitidine, mannitol, other medications, foods, dyes, or preservatives If you or your partner are pregnant or trying to get pregnant Breast-feeding How should I use this medication? This medication is injected into a vein or under the skin. It is given by your care team in a hospital or clinic setting. Talk to your care team about the use of this medication in children. While it may be prescribed for children as young as 1 month for selected conditions, precautions do apply. Overdosage: If you think you have taken too much of this medicine contact a poison control center or emergency room at once. NOTE: This medicine is only for you. Do not share this medicine with others. What if I miss a dose? Keep appointments for follow-up doses. It is important not to miss your dose. Call your care team if you are unable to keep an appointment. What may interact with this medication? Interactions are not expected. This list may not describe all possible interactions. Give your health care provider a list of all the medicines, herbs, non-prescription drugs, or dietary supplements you use. Also tell them if you smoke, drink alcohol, or use illegal drugs. Some items may interact with your medicine. What should I watch for while using this medication? Your condition will  be monitored carefully while you are receiving this medication. This medication may make you feel generally unwell. This is not uncommon as chemotherapy can affect healthy cells as well as cancer cells. Report any side effects. Continue your course of treatment even though you feel ill unless your care team tells you to stop. You may need blood work done while you are taking this medication. Other product types may be available that contain the medication azacitidine. The injection and oral products should not be used in place of one another. Talk to your care team if you have questions. This medication can cause serious side effects. To reduce the risk, your care team may give you other medications to take before receiving this one. Be sure to follow the directions from your care team. This medication may increase your risk of getting an infection. Call your care team for advice if you get a fever, chills, sore throat, or other symptoms of a cold or flu. Do not treat yourself. Try to avoid being around people who are sick. Avoid taking medications that contain aspirin, acetaminophen, ibuprofen, naproxen, or ketoprofen unless instructed by your care team. These medications may hide a fever. Be careful brushing or flossing your teeth or using a toothpick because you may get an infection or bleed more easily. If you have any dental work done, tell your dentist you are receiving this medication. Talk to your care team if you or your partner may be pregnant. Serious birth defects can occur if you take this medication during pregnancy and for 6 months after the  last dose. You will need a negative pregnancy test before starting this medication. Contraception is recommended while taking this medication and for 6 months after the last dose. Your care team can help you find the option that works for you. If your partner can get pregnant, use a condom during sex while taking this medication and for 3 months after the  last dose. Do not breastfeed while taking this medication and for 1 week after the last dose. This medication may cause infertility. Talk to your care team if you are concerned about your fertility. What side effects may I notice from receiving this medication? Side effects that you should report to your care team as soon as possible: Allergic reactions--skin rash, itching, hives, swelling of the face, lips, tongue, or throat Infection--fever, chills, cough, sore throat, wounds that don't heal, pain or trouble when passing urine, general feeling of discomfort or being unwell Kidney injury--decrease in the amount of urine, swelling of the ankles, hands, or feet Liver injury--right upper belly pain, loss of appetite, nausea, light-colored stool, dark yellow or brown urine, yellowing skin or eyes, unusual weakness or fatigue Low red blood cell level--unusual weakness or fatigue, dizziness, headache, trouble breathing Tumor lysis syndrome (TLS)--nausea, vomiting, diarrhea, decrease in the amount of urine, dark urine, unusual weakness or fatigue, confusion, muscle pain or cramps, fast or irregular heartbeat, joint pain Unusual bruising or bleeding Side effects that usually do not require medical attention (report to your care team if they continue or are bothersome): Constipation Diarrhea Nausea Pain, redness, or irritation at injection site Vomiting This list may not describe all possible side effects. Call your doctor for medical advice about side effects. You may report side effects to FDA at 1-800-FDA-1088. Where should I keep my medication? This medication is given in a hospital or clinic. It will not be stored at home. NOTE: This sheet is a summary. It may not cover all possible information. If you have questions about this medicine, talk to your doctor, pharmacist, or health care provider.  2024 Elsevier/Gold Standard (2022-10-13 00:00:00)

## 2023-07-29 ENCOUNTER — Inpatient Hospital Stay

## 2023-07-29 VITALS — BP 104/59 | HR 61 | Temp 98.2°F | Resp 20

## 2023-07-29 DIAGNOSIS — D46Z Other myelodysplastic syndromes: Secondary | ICD-10-CM

## 2023-07-29 DIAGNOSIS — Z79899 Other long term (current) drug therapy: Secondary | ICD-10-CM | POA: Diagnosis not present

## 2023-07-29 DIAGNOSIS — Z5111 Encounter for antineoplastic chemotherapy: Secondary | ICD-10-CM | POA: Diagnosis not present

## 2023-07-29 DIAGNOSIS — D4621 Refractory anemia with excess of blasts 1: Secondary | ICD-10-CM | POA: Diagnosis not present

## 2023-07-29 MED ORDER — SODIUM CHLORIDE 0.9 % IV SOLN: INTRAVENOUS | Status: DC

## 2023-07-29 MED ORDER — SODIUM CHLORIDE 0.9 % IV SOLN
75.0000 mg/m2 | Freq: Once | INTRAVENOUS | Status: AC
  Administered 2023-07-29: 148 mg via INTRAVENOUS
  Filled 2023-07-29: qty 14.8

## 2023-07-29 MED ORDER — ONDANSETRON HCL 8 MG PO TABS
8.0000 mg | ORAL_TABLET | Freq: Once | ORAL | Status: DC
Start: 2023-07-29 — End: 2023-07-29

## 2023-07-29 NOTE — Patient Instructions (Signed)
 [facility] - A DEPT OF [facility]  Discharge Instructions: Thank you for choosing [facility] to provide your oncology and hematology care.   If you have a lab appointment with the Cancer Center, please go directly to the Cancer Center and check in at the registration area.  Wear comfortable clothing and clothing appropriate for easy access to any Portacath or PICC line.   We strive to give you quality time with your provider. You may need to reschedule your appointment if you arrive late (15 or more minutes).  Arriving late affects you and other patients whose appointments are after yours.  Also, if you miss three or more appointments without notifying the office, you may be dismissed from the clinic at the provider's discretion.      For prescription refill requests, have your pharmacy contact our office and allow 72 hours for refills to be completed.    Today you received the following chemotherapy and/or immunotherapy agents:  Vidaza      To help prevent nausea and vomiting after your treatment, we encourage you to take your nausea medication as directed.  BELOW ARE SYMPTOMS THAT SHOULD BE REPORTED IMMEDIATELY: *FEVER GREATER THAN 100.4 F (38 C) OR HIGHER *CHILLS OR SWEATING *NAUSEA AND VOMITING THAT IS NOT CONTROLLED WITH YOUR NAUSEA MEDICATION *UNUSUAL SHORTNESS OF BREATH *UNUSUAL BRUISING OR BLEEDING *URINARY PROBLEMS (pain or burning when urinating, or frequent urination) *BOWEL PROBLEMS (unusual diarrhea, constipation, pain near the anus) TENDERNESS IN MOUTH AND THROAT WITH OR WITHOUT PRESENCE OF ULCERS (sore throat, sores in mouth, or a toothache) UNUSUAL RASH, SWELLING OR PAIN  UNUSUAL VAGINAL DISCHARGE OR ITCHING   Items with * indicate a potential emergency and should be followed up as soon as possible or go to the Emergency Department if any problems should occur.  Please show the CHEMOTHERAPY ALERT CARD or IMMUNOTHERAPY  ALERT CARD at check-in to the Emergency Department and triage nurse. Should you have questions after your visit or need to cancel or reschedule your appointment, please contact [facility] - A DEPT OF [facility]  [phone] and follow the prompts.  Office hours are 8:00 a.m. to 4:30 p.m. Monday - Friday. Please note that voicemails left after 4:00 p.m. may not be returned until the following business day.  We are closed weekends and major holidays. You have access to a nurse at all times for urgent questions. Please call the main number to the clinic [phone] and follow the prompts.  For any non-urgent questions, you may also contact your provider using MyChart. We now offer e-Visits for anyone 30 and older to request care online for non-urgent symptoms. For details visit [email or web address].   Also download the MyChart app! Go to the app store, search "MyChart", open the app, select [place], and log in with your MyChart username and password.

## 2023-07-30 ENCOUNTER — Encounter: Payer: Self-pay | Admitting: Internal Medicine

## 2023-07-30 ENCOUNTER — Other Ambulatory Visit: Payer: Self-pay

## 2023-07-30 ENCOUNTER — Inpatient Hospital Stay

## 2023-07-30 VITALS — BP 123/64 | HR 64 | Temp 97.3°F | Resp 18

## 2023-07-30 DIAGNOSIS — D4621 Refractory anemia with excess of blasts 1: Secondary | ICD-10-CM

## 2023-07-30 DIAGNOSIS — D46Z Other myelodysplastic syndromes: Secondary | ICD-10-CM

## 2023-07-30 DIAGNOSIS — Z5111 Encounter for antineoplastic chemotherapy: Secondary | ICD-10-CM | POA: Diagnosis not present

## 2023-07-30 DIAGNOSIS — Z79899 Other long term (current) drug therapy: Secondary | ICD-10-CM | POA: Diagnosis not present

## 2023-07-30 LAB — CBC WITH DIFFERENTIAL (CANCER CENTER ONLY)
Abs Immature Granulocytes: 0 10*3/uL (ref 0.00–0.07)
Basophils Absolute: 0 10*3/uL (ref 0.0–0.1)
Basophils Relative: 1 %
Eosinophils Absolute: 0 10*3/uL (ref 0.0–0.5)
Eosinophils Relative: 4 %
HCT: 26.5 % — ABNORMAL LOW (ref 39.0–52.0)
Hemoglobin: 9.1 g/dL — ABNORMAL LOW (ref 13.0–17.0)
Immature Granulocytes: 0 %
Lymphocytes Relative: 44 %
Lymphs Abs: 0.3 10*3/uL — ABNORMAL LOW (ref 0.7–4.0)
MCH: 34.6 pg — ABNORMAL HIGH (ref 26.0–34.0)
MCHC: 34.3 g/dL (ref 30.0–36.0)
MCV: 100.8 fL — ABNORMAL HIGH (ref 80.0–100.0)
Monocytes Absolute: 0 10*3/uL — ABNORMAL LOW (ref 0.1–1.0)
Monocytes Relative: 4 %
Neutro Abs: 0.3 10*3/uL — CL (ref 1.7–7.7)
Neutrophils Relative %: 47 %
Platelet Count: 74 10*3/uL — ABNORMAL LOW (ref 150–400)
RBC: 2.63 MIL/uL — ABNORMAL LOW (ref 4.22–5.81)
RDW: 18.6 % — ABNORMAL HIGH (ref 11.5–15.5)
Smear Review: NORMAL
WBC Count: 0.7 10*3/uL — CL (ref 4.0–10.5)
nRBC: 0 % (ref 0.0–0.2)

## 2023-07-30 LAB — CMP (CANCER CENTER ONLY)
ALT: 18 U/L (ref 0–44)
AST: 15 U/L (ref 15–41)
Albumin: 4.3 g/dL (ref 3.5–5.0)
Alkaline Phosphatase: 51 U/L (ref 38–126)
Anion gap: 5 (ref 5–15)
BUN: 20 mg/dL (ref 8–23)
CO2: 27 mmol/L (ref 22–32)
Calcium: 9.6 mg/dL (ref 8.9–10.3)
Chloride: 106 mmol/L (ref 98–111)
Creatinine: 1.36 mg/dL — ABNORMAL HIGH (ref 0.61–1.24)
GFR, Estimated: 52 mL/min — ABNORMAL LOW (ref >60)
Glucose, Bld: 125 mg/dL — ABNORMAL HIGH (ref 70–99)
Potassium: 4.5 mmol/L (ref 3.5–5.1)
Sodium: 138 mmol/L (ref 135–145)
Total Bilirubin: 0.6 mg/dL (ref 0.0–1.2)
Total Protein: 6.1 g/dL — ABNORMAL LOW (ref 6.5–8.1)

## 2023-07-30 LAB — SAMPLE TO BLOOD BANK

## 2023-07-30 MED ORDER — SODIUM CHLORIDE 0.9 % IV SOLN
75.0000 mg/m2 | Freq: Once | INTRAVENOUS | Status: AC
  Administered 2023-07-30: 148 mg via INTRAVENOUS
  Filled 2023-07-30: qty 14.8

## 2023-07-30 MED ORDER — SODIUM CHLORIDE 0.9 % IV SOLN: INTRAVENOUS | Status: DC

## 2023-07-30 MED ORDER — ONDANSETRON HCL 8 MG PO TABS
8.0000 mg | ORAL_TABLET | Freq: Once | ORAL | Status: DC
Start: 2023-07-30 — End: 2023-07-30

## 2023-07-30 NOTE — Progress Notes (Signed)
 Critical result received from lab of WBC 0.7 and ANC 0.3  Dr. Maria Shiner aware and pt ok to treat despite counts. No new orders received.

## 2023-07-31 ENCOUNTER — Inpatient Hospital Stay

## 2023-07-31 VITALS — BP 120/58 | HR 65 | Temp 97.6°F | Resp 17

## 2023-07-31 DIAGNOSIS — D4621 Refractory anemia with excess of blasts 1: Secondary | ICD-10-CM | POA: Diagnosis not present

## 2023-07-31 DIAGNOSIS — Z5111 Encounter for antineoplastic chemotherapy: Secondary | ICD-10-CM | POA: Diagnosis not present

## 2023-07-31 DIAGNOSIS — D46Z Other myelodysplastic syndromes: Secondary | ICD-10-CM

## 2023-07-31 DIAGNOSIS — Z79899 Other long term (current) drug therapy: Secondary | ICD-10-CM | POA: Diagnosis not present

## 2023-07-31 MED ORDER — SODIUM CHLORIDE 0.9 % IV SOLN: INTRAVENOUS | Status: DC

## 2023-07-31 MED ORDER — HEPARIN SOD (PORK) LOCK FLUSH 100 UNIT/ML IV SOLN
500.0000 [IU] | Freq: Once | INTRAVENOUS | Status: DC | PRN
Start: 2023-07-31 — End: 2023-07-31

## 2023-07-31 MED ORDER — ONDANSETRON HCL 8 MG PO TABS
8.0000 mg | ORAL_TABLET | Freq: Once | ORAL | Status: DC
Start: 2023-07-31 — End: 2023-07-31

## 2023-07-31 MED ORDER — SODIUM CHLORIDE 0.9 % IV SOLN
75.0000 mg/m2 | Freq: Once | INTRAVENOUS | Status: AC
  Administered 2023-07-31: 148 mg via INTRAVENOUS
  Filled 2023-07-31: qty 14.8

## 2023-07-31 MED ORDER — SODIUM CHLORIDE 0.9% FLUSH: 10.0000 mL | INTRAVENOUS | Status: DC | PRN

## 2023-07-31 NOTE — Patient Instructions (Signed)
 Azacitidine Injection What is this medication? AZACITIDINE (ay za SITE i deen) treats blood and bone marrow cancers. It works by slowing down the growth of cancer cells. This medicine may be used for other purposes; ask your health care provider or pharmacist if you have questions. COMMON BRAND NAME(S): Vidaza What should I tell my care team before I take this medication? They need to know if you have any of these conditions: Kidney disease Liver disease Low blood cell levels, such as low white cells, platelets, or red blood cells Low levels of albumin in the blood Low levels of bicarbonate in the blood An unusual or allergic reaction to azacitidine, mannitol, other medications, foods, dyes, or preservatives If you or your partner are pregnant or trying to get pregnant Breast-feeding How should I use this medication? This medication is injected into a vein or under the skin. It is given by your care team in a hospital or clinic setting. Talk to your care team about the use of this medication in children. While it may be prescribed for children as young as 1 month for selected conditions, precautions do apply. Overdosage: If you think you have taken too much of this medicine contact a poison control center or emergency room at once. NOTE: This medicine is only for you. Do not share this medicine with others. What if I miss a dose? Keep appointments for follow-up doses. It is important not to miss your dose. Call your care team if you are unable to keep an appointment. What may interact with this medication? Interactions are not expected. This list may not describe all possible interactions. Give your health care provider a list of all the medicines, herbs, non-prescription drugs, or dietary supplements you use. Also tell them if you smoke, drink alcohol, or use illegal drugs. Some items may interact with your medicine. What should I watch for while using this medication? Your condition will  be monitored carefully while you are receiving this medication. This medication may make you feel generally unwell. This is not uncommon as chemotherapy can affect healthy cells as well as cancer cells. Report any side effects. Continue your course of treatment even though you feel ill unless your care team tells you to stop. You may need blood work done while you are taking this medication. Other product types may be available that contain the medication azacitidine. The injection and oral products should not be used in place of one another. Talk to your care team if you have questions. This medication can cause serious side effects. To reduce the risk, your care team may give you other medications to take before receiving this one. Be sure to follow the directions from your care team. This medication may increase your risk of getting an infection. Call your care team for advice if you get a fever, chills, sore throat, or other symptoms of a cold or flu. Do not treat yourself. Try to avoid being around people who are sick. Avoid taking medications that contain aspirin, acetaminophen, ibuprofen, naproxen, or ketoprofen unless instructed by your care team. These medications may hide a fever. Be careful brushing or flossing your teeth or using a toothpick because you may get an infection or bleed more easily. If you have any dental work done, tell your dentist you are receiving this medication. Talk to your care team if you or your partner may be pregnant. Serious birth defects can occur if you take this medication during pregnancy and for 6 months after the  last dose. You will need a negative pregnancy test before starting this medication. Contraception is recommended while taking this medication and for 6 months after the last dose. Your care team can help you find the option that works for you. If your partner can get pregnant, use a condom during sex while taking this medication and for 3 months after the  last dose. Do not breastfeed while taking this medication and for 1 week after the last dose. This medication may cause infertility. Talk to your care team if you are concerned about your fertility. What side effects may I notice from receiving this medication? Side effects that you should report to your care team as soon as possible: Allergic reactions--skin rash, itching, hives, swelling of the face, lips, tongue, or throat Infection--fever, chills, cough, sore throat, wounds that don't heal, pain or trouble when passing urine, general feeling of discomfort or being unwell Kidney injury--decrease in the amount of urine, swelling of the ankles, hands, or feet Liver injury--right upper belly pain, loss of appetite, nausea, light-colored stool, dark yellow or brown urine, yellowing skin or eyes, unusual weakness or fatigue Low red blood cell level--unusual weakness or fatigue, dizziness, headache, trouble breathing Tumor lysis syndrome (TLS)--nausea, vomiting, diarrhea, decrease in the amount of urine, dark urine, unusual weakness or fatigue, confusion, muscle pain or cramps, fast or irregular heartbeat, joint pain Unusual bruising or bleeding Side effects that usually do not require medical attention (report to your care team if they continue or are bothersome): Constipation Diarrhea Nausea Pain, redness, or irritation at injection site Vomiting This list may not describe all possible side effects. Call your doctor for medical advice about side effects. You may report side effects to FDA at 1-800-FDA-1088. Where should I keep my medication? This medication is given in a hospital or clinic. It will not be stored at home. NOTE: This sheet is a summary. It may not cover all possible information. If you have questions about this medicine, talk to your doctor, pharmacist, or health care provider.  2024 Elsevier/Gold Standard (2022-10-13 00:00:00)

## 2023-08-01 ENCOUNTER — Other Ambulatory Visit: Payer: Self-pay | Admitting: Internal Medicine

## 2023-08-01 DIAGNOSIS — Z1211 Encounter for screening for malignant neoplasm of colon: Secondary | ICD-10-CM

## 2023-08-03 ENCOUNTER — Inpatient Hospital Stay

## 2023-08-03 ENCOUNTER — Telehealth: Payer: Self-pay | Admitting: *Deleted

## 2023-08-03 VITALS — BP 129/65 | HR 51 | Temp 97.6°F | Resp 17

## 2023-08-03 DIAGNOSIS — D46Z Other myelodysplastic syndromes: Secondary | ICD-10-CM

## 2023-08-03 DIAGNOSIS — D4621 Refractory anemia with excess of blasts 1: Secondary | ICD-10-CM

## 2023-08-03 DIAGNOSIS — Z79899 Other long term (current) drug therapy: Secondary | ICD-10-CM | POA: Diagnosis not present

## 2023-08-03 DIAGNOSIS — Z5111 Encounter for antineoplastic chemotherapy: Secondary | ICD-10-CM | POA: Diagnosis not present

## 2023-08-03 LAB — CBC WITH DIFFERENTIAL (CANCER CENTER ONLY)
Abs Immature Granulocytes: 0.01 10*3/uL (ref 0.00–0.07)
Basophils Absolute: 0 10*3/uL (ref 0.0–0.1)
Basophils Relative: 2 %
Eosinophils Absolute: 0 10*3/uL (ref 0.0–0.5)
Eosinophils Relative: 8 %
HCT: 25 % — ABNORMAL LOW (ref 39.0–52.0)
Hemoglobin: 8.7 g/dL — ABNORMAL LOW (ref 13.0–17.0)
Immature Granulocytes: 2 %
Lymphocytes Relative: 51 %
Lymphs Abs: 0.3 10*3/uL — ABNORMAL LOW (ref 0.7–4.0)
MCH: 34.4 pg — ABNORMAL HIGH (ref 26.0–34.0)
MCHC: 34.8 g/dL (ref 30.0–36.0)
MCV: 98.8 fL (ref 80.0–100.0)
Monocytes Absolute: 0 10*3/uL — ABNORMAL LOW (ref 0.1–1.0)
Monocytes Relative: 4 %
Neutro Abs: 0.2 10*3/uL — CL (ref 1.7–7.7)
Neutrophils Relative %: 33 %
Platelet Count: 49 10*3/uL — ABNORMAL LOW (ref 150–400)
RBC: 2.53 MIL/uL — ABNORMAL LOW (ref 4.22–5.81)
RDW: 18 % — ABNORMAL HIGH (ref 11.5–15.5)
Smear Review: NORMAL
WBC Count: 0.5 10*3/uL — CL (ref 4.0–10.5)
nRBC: 0 % (ref 0.0–0.2)

## 2023-08-03 LAB — CMP (CANCER CENTER ONLY)
ALT: 16 U/L (ref 0–44)
AST: 12 U/L — ABNORMAL LOW (ref 15–41)
Albumin: 4.3 g/dL (ref 3.5–5.0)
Alkaline Phosphatase: 49 U/L (ref 38–126)
Anion gap: 4 — ABNORMAL LOW (ref 5–15)
BUN: 14 mg/dL (ref 8–23)
CO2: 28 mmol/L (ref 22–32)
Calcium: 9.6 mg/dL (ref 8.9–10.3)
Chloride: 109 mmol/L (ref 98–111)
Creatinine: 1.26 mg/dL — ABNORMAL HIGH (ref 0.61–1.24)
GFR, Estimated: 57 mL/min — ABNORMAL LOW (ref >60)
Glucose, Bld: 133 mg/dL — ABNORMAL HIGH (ref 70–99)
Potassium: 4.5 mmol/L (ref 3.5–5.1)
Sodium: 141 mmol/L (ref 135–145)
Total Bilirubin: 0.7 mg/dL (ref 0.0–1.2)
Total Protein: 6 g/dL — ABNORMAL LOW (ref 6.5–8.1)

## 2023-08-03 LAB — SAMPLE TO BLOOD BANK

## 2023-08-03 MED ORDER — SODIUM CHLORIDE 0.9 % IV SOLN
75.0000 mg/m2 | Freq: Once | INTRAVENOUS | Status: AC
  Administered 2023-08-03: 148 mg via INTRAVENOUS
  Filled 2023-08-03: qty 14.8

## 2023-08-03 MED ORDER — SODIUM CHLORIDE 0.9 % IV SOLN: INTRAVENOUS | Status: DC

## 2023-08-03 MED ORDER — ONDANSETRON HCL 8 MG PO TABS
8.0000 mg | ORAL_TABLET | Freq: Once | ORAL | Status: AC
  Administered 2023-08-03: 8 mg via ORAL
  Filled 2023-08-03: qty 1

## 2023-08-03 NOTE — Progress Notes (Signed)
 Reviewed pt labs with Dr. Myna Hidalgo and pt ok to treat despite counts.

## 2023-08-03 NOTE — Telephone Encounter (Signed)
 Critical WBC .5, ANC .2, Plts 49 and Hgb 8.7.  Dr Maria Shiner aware and no orders received

## 2023-08-03 NOTE — Patient Instructions (Signed)
 [facility] - A DEPT OF [facility]  Discharge Instructions: Thank you for choosing [facility] to provide your oncology and hematology care.   If you have a lab appointment with the Cancer Center, please go directly to the Cancer Center and check in at the registration area.  Wear comfortable clothing and clothing appropriate for easy access to any Portacath or PICC line.   We strive to give you quality time with your provider. You may need to reschedule your appointment if you arrive late (15 or more minutes).  Arriving late affects you and other patients whose appointments are after yours.  Also, if you miss three or more appointments without notifying the office, you may be dismissed from the clinic at the provider's discretion.      For prescription refill requests, have your pharmacy contact our office and allow 72 hours for refills to be completed.    Today you received the following chemotherapy and/or immunotherapy agents:  Vidaza      To help prevent nausea and vomiting after your treatment, we encourage you to take your nausea medication as directed.  BELOW ARE SYMPTOMS THAT SHOULD BE REPORTED IMMEDIATELY: *FEVER GREATER THAN 100.4 F (38 C) OR HIGHER *CHILLS OR SWEATING *NAUSEA AND VOMITING THAT IS NOT CONTROLLED WITH YOUR NAUSEA MEDICATION *UNUSUAL SHORTNESS OF BREATH *UNUSUAL BRUISING OR BLEEDING *URINARY PROBLEMS (pain or burning when urinating, or frequent urination) *BOWEL PROBLEMS (unusual diarrhea, constipation, pain near the anus) TENDERNESS IN MOUTH AND THROAT WITH OR WITHOUT PRESENCE OF ULCERS (sore throat, sores in mouth, or a toothache) UNUSUAL RASH, SWELLING OR PAIN  UNUSUAL VAGINAL DISCHARGE OR ITCHING   Items with * indicate a potential emergency and should be followed up as soon as possible or go to the Emergency Department if any problems should occur.  Please show the CHEMOTHERAPY ALERT CARD or IMMUNOTHERAPY  ALERT CARD at check-in to the Emergency Department and triage nurse. Should you have questions after your visit or need to cancel or reschedule your appointment, please contact [facility] - A DEPT OF [facility]  [phone] and follow the prompts.  Office hours are 8:00 a.m. to 4:30 p.m. Monday - Friday. Please note that voicemails left after 4:00 p.m. may not be returned until the following business day.  We are closed weekends and major holidays. You have access to a nurse at all times for urgent questions. Please call the main number to the clinic [phone] and follow the prompts.  For any non-urgent questions, you may also contact your provider using MyChart. We now offer e-Visits for anyone 30 and older to request care online for non-urgent symptoms. For details visit [email or web address].   Also download the MyChart app! Go to the app store, search "MyChart", open the app, select [place], and log in with your MyChart username and password.

## 2023-08-04 ENCOUNTER — Inpatient Hospital Stay

## 2023-08-04 VITALS — BP 114/55 | HR 59 | Temp 97.7°F | Resp 18

## 2023-08-04 DIAGNOSIS — D4621 Refractory anemia with excess of blasts 1: Secondary | ICD-10-CM | POA: Diagnosis not present

## 2023-08-04 DIAGNOSIS — Z5111 Encounter for antineoplastic chemotherapy: Secondary | ICD-10-CM | POA: Diagnosis not present

## 2023-08-04 DIAGNOSIS — Z79899 Other long term (current) drug therapy: Secondary | ICD-10-CM | POA: Diagnosis not present

## 2023-08-04 DIAGNOSIS — D46Z Other myelodysplastic syndromes: Secondary | ICD-10-CM

## 2023-08-04 MED ORDER — SODIUM CHLORIDE 0.9 % IV SOLN: INTRAVENOUS | Status: DC

## 2023-08-04 MED ORDER — ONDANSETRON HCL 8 MG PO TABS: 8.0000 mg | ORAL_TABLET | Freq: Once | ORAL | Status: DC

## 2023-08-04 MED ORDER — SODIUM CHLORIDE 0.9 % IV SOLN
75.0000 mg/m2 | Freq: Once | INTRAVENOUS | Status: AC
  Administered 2023-08-04: 148 mg via INTRAVENOUS
  Filled 2023-08-04: qty 14.8

## 2023-08-04 NOTE — Patient Instructions (Addendum)
 [facility] - A DEPT OF [facility]  Discharge Instructions: Thank you for choosing [facility] to provide your oncology and hematology care.   If you have a lab appointment with the Cancer Center, please go directly to the Cancer Center and check in at the registration area.  Wear comfortable clothing and clothing appropriate for easy access to any Portacath or PICC line.   We strive to give you quality time with your provider. You may need to reschedule your appointment if you arrive late (15 or more minutes).  Arriving late affects you and other patients whose appointments are after yours.  Also, if you miss three or more appointments without notifying the office, you may be dismissed from the clinic at the provider's discretion.      For prescription refill requests, have your pharmacy contact our office and allow 72 hours for refills to be completed.    Today you received the following chemotherapy and/or immunotherapy agents:  Vidaza      To help prevent nausea and vomiting after your treatment, we encourage you to take your nausea medication as directed.  BELOW ARE SYMPTOMS THAT SHOULD BE REPORTED IMMEDIATELY: *FEVER GREATER THAN 100.4 F (38 C) OR HIGHER *CHILLS OR SWEATING *NAUSEA AND VOMITING THAT IS NOT CONTROLLED WITH YOUR NAUSEA MEDICATION *UNUSUAL SHORTNESS OF BREATH *UNUSUAL BRUISING OR BLEEDING *URINARY PROBLEMS (pain or burning when urinating, or frequent urination) *BOWEL PROBLEMS (unusual diarrhea, constipation, pain near the anus) TENDERNESS IN MOUTH AND THROAT WITH OR WITHOUT PRESENCE OF ULCERS (sore throat, sores in mouth, or a toothache) UNUSUAL RASH, SWELLING OR PAIN  UNUSUAL VAGINAL DISCHARGE OR ITCHING   Items with * indicate a potential emergency and should be followed up as soon as possible or go to the Emergency Department if any problems should occur.  Please show the CHEMOTHERAPY ALERT CARD or IMMUNOTHERAPY  ALERT CARD at check-in to the Emergency Department and triage nurse. Should you have questions after your visit or need to cancel or reschedule your appointment, please contact [facility] - A DEPT OF [facility]  [phone] and follow the prompts.  Office hours are 8:00 a.m. to 4:30 p.m. Monday - Friday. Please note that voicemails left after 4:00 p.m. may not be returned until the following business day.  We are closed weekends and major holidays. You have access to a nurse at all times for urgent questions. Please call the main number to the clinic [phone] and follow the prompts.  For any non-urgent questions, you may also contact your provider using MyChart. We now offer e-Visits for anyone 30 and older to request care online for non-urgent symptoms. For details visit [email or web address].   Also download the MyChart app! Go to the app store, search "MyChart", open the app, select [place], and log in with your MyChart username and password.

## 2023-08-06 ENCOUNTER — Telehealth: Payer: Self-pay

## 2023-08-06 ENCOUNTER — Inpatient Hospital Stay

## 2023-08-06 ENCOUNTER — Encounter: Payer: Self-pay | Admitting: Hematology & Oncology

## 2023-08-06 DIAGNOSIS — Z79899 Other long term (current) drug therapy: Secondary | ICD-10-CM | POA: Diagnosis not present

## 2023-08-06 DIAGNOSIS — D4621 Refractory anemia with excess of blasts 1: Secondary | ICD-10-CM

## 2023-08-06 DIAGNOSIS — D46Z Other myelodysplastic syndromes: Secondary | ICD-10-CM

## 2023-08-06 DIAGNOSIS — Z5111 Encounter for antineoplastic chemotherapy: Secondary | ICD-10-CM | POA: Diagnosis not present

## 2023-08-06 LAB — CBC WITH DIFFERENTIAL (CANCER CENTER ONLY)
Abs Immature Granulocytes: 0 10*3/uL (ref 0.00–0.07)
Basophils Absolute: 0 10*3/uL (ref 0.0–0.1)
Basophils Relative: 0 %
Eosinophils Absolute: 0 10*3/uL (ref 0.0–0.5)
Eosinophils Relative: 5 %
HCT: 24.8 % — ABNORMAL LOW (ref 39.0–52.0)
Hemoglobin: 8.5 g/dL — ABNORMAL LOW (ref 13.0–17.0)
Immature Granulocytes: 0 %
Lymphocytes Relative: 41 %
Lymphs Abs: 0.3 10*3/uL — ABNORMAL LOW (ref 0.7–4.0)
MCH: 34.3 pg — ABNORMAL HIGH (ref 26.0–34.0)
MCHC: 34.3 g/dL (ref 30.0–36.0)
MCV: 100 fL (ref 80.0–100.0)
Monocytes Absolute: 0 10*3/uL — ABNORMAL LOW (ref 0.1–1.0)
Monocytes Relative: 2 %
Neutro Abs: 0.3 10*3/uL — CL (ref 1.7–7.7)
Neutrophils Relative %: 52 %
Platelet Count: 39 10*3/uL — ABNORMAL LOW (ref 150–400)
RBC: 2.48 MIL/uL — ABNORMAL LOW (ref 4.22–5.81)
RDW: 17.6 % — ABNORMAL HIGH (ref 11.5–15.5)
Smear Review: NORMAL
WBC Count: 0.6 10*3/uL — CL (ref 4.0–10.5)
WBC Morphology: ABNORMAL
nRBC: 0 % (ref 0.0–0.2)

## 2023-08-06 LAB — CMP (CANCER CENTER ONLY)
ALT: 19 U/L (ref 0–44)
AST: 14 U/L — ABNORMAL LOW (ref 15–41)
Albumin: 4.3 g/dL (ref 3.5–5.0)
Alkaline Phosphatase: 46 U/L (ref 38–126)
Anion gap: 6 (ref 5–15)
BUN: 17 mg/dL (ref 8–23)
CO2: 28 mmol/L (ref 22–32)
Calcium: 9.5 mg/dL (ref 8.9–10.3)
Chloride: 106 mmol/L (ref 98–111)
Creatinine: 1.47 mg/dL — ABNORMAL HIGH (ref 0.61–1.24)
GFR, Estimated: 48 mL/min — ABNORMAL LOW (ref >60)
Glucose, Bld: 174 mg/dL — ABNORMAL HIGH (ref 70–99)
Potassium: 4.7 mmol/L (ref 3.5–5.1)
Sodium: 140 mmol/L (ref 135–145)
Total Bilirubin: 0.7 mg/dL (ref 0.0–1.2)
Total Protein: 5.9 g/dL — ABNORMAL LOW (ref 6.5–8.1)

## 2023-08-06 LAB — SAMPLE TO BLOOD BANK

## 2023-08-06 NOTE — Telephone Encounter (Signed)
 Critical labs received:   WBC 0.6, ANC 0.3, PLT 39  MD notified.

## 2023-08-10 ENCOUNTER — Inpatient Hospital Stay

## 2023-08-10 ENCOUNTER — Telehealth: Payer: Self-pay

## 2023-08-10 ENCOUNTER — Other Ambulatory Visit: Payer: Self-pay

## 2023-08-10 DIAGNOSIS — D46Z Other myelodysplastic syndromes: Secondary | ICD-10-CM

## 2023-08-10 DIAGNOSIS — Z5111 Encounter for antineoplastic chemotherapy: Secondary | ICD-10-CM | POA: Diagnosis not present

## 2023-08-10 DIAGNOSIS — D4621 Refractory anemia with excess of blasts 1: Secondary | ICD-10-CM

## 2023-08-10 DIAGNOSIS — Z79899 Other long term (current) drug therapy: Secondary | ICD-10-CM | POA: Diagnosis not present

## 2023-08-10 LAB — CBC WITH DIFFERENTIAL (CANCER CENTER ONLY)
Abs Immature Granulocytes: 0 10*3/uL (ref 0.00–0.07)
Basophils Absolute: 0 10*3/uL (ref 0.0–0.1)
Basophils Relative: 0 %
Eosinophils Absolute: 0 10*3/uL (ref 0.0–0.5)
Eosinophils Relative: 2 %
HCT: 22.3 % — ABNORMAL LOW (ref 39.0–52.0)
Hemoglobin: 7.8 g/dL — ABNORMAL LOW (ref 13.0–17.0)
Immature Granulocytes: 0 %
Lymphocytes Relative: 35 %
Lymphs Abs: 0.2 10*3/uL — ABNORMAL LOW (ref 0.7–4.0)
MCH: 34.4 pg — ABNORMAL HIGH (ref 26.0–34.0)
MCHC: 35 g/dL (ref 30.0–36.0)
MCV: 98.2 fL (ref 80.0–100.0)
Monocytes Absolute: 0 10*3/uL — ABNORMAL LOW (ref 0.1–1.0)
Monocytes Relative: 4 %
Neutro Abs: 0.3 10*3/uL — CL (ref 1.7–7.7)
Neutrophils Relative %: 59 %
Platelet Count: 29 10*3/uL — ABNORMAL LOW (ref 150–400)
RBC: 2.27 MIL/uL — ABNORMAL LOW (ref 4.22–5.81)
RDW: 17.4 % — ABNORMAL HIGH (ref 11.5–15.5)
Smear Review: NORMAL
WBC Count: 0.6 10*3/uL — CL (ref 4.0–10.5)
nRBC: 0 % (ref 0.0–0.2)

## 2023-08-10 LAB — CMP (CANCER CENTER ONLY)
ALT: 15 U/L (ref 0–44)
AST: 11 U/L — ABNORMAL LOW (ref 15–41)
Albumin: 4.2 g/dL (ref 3.5–5.0)
Alkaline Phosphatase: 53 U/L (ref 38–126)
Anion gap: 5 (ref 5–15)
BUN: 16 mg/dL (ref 8–23)
CO2: 28 mmol/L (ref 22–32)
Calcium: 9.4 mg/dL (ref 8.9–10.3)
Chloride: 108 mmol/L (ref 98–111)
Creatinine: 1.29 mg/dL — ABNORMAL HIGH (ref 0.61–1.24)
GFR, Estimated: 56 mL/min — ABNORMAL LOW (ref >60)
Glucose, Bld: 162 mg/dL — ABNORMAL HIGH (ref 70–99)
Potassium: 4.3 mmol/L (ref 3.5–5.1)
Sodium: 141 mmol/L (ref 135–145)
Total Bilirubin: 0.6 mg/dL (ref 0.0–1.2)
Total Protein: 5.9 g/dL — ABNORMAL LOW (ref 6.5–8.1)

## 2023-08-10 LAB — SAMPLE TO BLOOD BANK

## 2023-08-10 LAB — PREPARE RBC (CROSSMATCH)

## 2023-08-10 NOTE — Telephone Encounter (Signed)
 Critical lab received from lab of WBC 0.6, ANC 0.3 and platelets 29.  Dr. Maria Shiner aware and no new orders received. Pt hemoglobin 7.8 and pt states he does not feel any more fatigued or SOB than he did last week. Pt states he would like to wait to see what his labs are on Thursday 08/13/2023. Pt states he did have a nosebleed yesterday lasting less than a minute. Pt educated that if he has longer or bigger nose bleeds to contact the office. Pt also aware to call if his gums start to bleed. Pt verbalized understanding and had no further questions. Pt appreciative of call.

## 2023-08-10 NOTE — Telephone Encounter (Signed)
 Patient called back shortly after talking to this RN where he denied needing blood and stated he was woozy and needed something fast  Pt requesting to get a unit of blood to help me feel better. Dr. Maria Shiner aware and orders received for one unit of blood. 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 I guess I will just suffer through feeling this way. This RN offered patient an earlier appointment time and he said I will make that work Pt appreciative of help and stated he will be here tomorrow 08/11/2023 at 0900.

## 2023-08-11 ENCOUNTER — Other Ambulatory Visit: Payer: Self-pay | Admitting: *Deleted

## 2023-08-11 ENCOUNTER — Inpatient Hospital Stay

## 2023-08-11 DIAGNOSIS — Z5111 Encounter for antineoplastic chemotherapy: Secondary | ICD-10-CM | POA: Diagnosis not present

## 2023-08-11 DIAGNOSIS — D46Z Other myelodysplastic syndromes: Secondary | ICD-10-CM

## 2023-08-11 DIAGNOSIS — Z79899 Other long term (current) drug therapy: Secondary | ICD-10-CM | POA: Diagnosis not present

## 2023-08-11 DIAGNOSIS — D4621 Refractory anemia with excess of blasts 1: Secondary | ICD-10-CM | POA: Diagnosis not present

## 2023-08-11 MED ORDER — SODIUM CHLORIDE 0.9% IV SOLUTION
250.0000 mL | INTRAVENOUS | Status: DC
  Administered 2023-08-11: 250 mL via INTRAVENOUS

## 2023-08-11 MED ORDER — ACETAMINOPHEN 325 MG PO TABS
650.0000 mg | ORAL_TABLET | Freq: Once | ORAL | Status: AC
  Administered 2023-08-11: 650 mg via ORAL
  Filled 2023-08-11: qty 2

## 2023-08-11 MED ORDER — DIPHENHYDRAMINE HCL 25 MG PO CAPS
25.0000 mg | ORAL_CAPSULE | Freq: Once | ORAL | Status: AC
  Administered 2023-08-11: 25 mg via ORAL
  Filled 2023-08-11: qty 1

## 2023-08-11 MED ORDER — LIDOCAINE-PRILOCAINE 2.5-2.5 % EX CREA
1.0000 | TOPICAL_CREAM | CUTANEOUS | 0 refills | Status: DC | PRN
Start: 2023-08-11 — End: 2023-09-10

## 2023-08-11 NOTE — Patient Instructions (Signed)
 Blood Transfusion, Adult A blood transfusion is a procedure in which you receive blood or a type of blood cell (blood component) through an IV. You may need a blood transfusion when you have a low blood count, which is a low number of any blood cell. This may result from a bleeding disorder, illness, injury, or surgery. The blood may come from a donor, or you may be able to have your own blood collected and stored (autologous blood donation) before a planned surgery. The blood given in a transfusion may be made up of different blood components. You may receive: Red blood cells. These carry oxygen to the cells in the body. Platelets. These help your blood to clot. Plasma. This is the liquid part of your blood. It carries proteins and other substances throughout the body. White blood cells. These help you fight infections. If you have hemophilia or another clotting disorder, you may also receive other types of blood products. Depending on the type of blood product, this procedure may take 1-4 hours to complete. Tell a health care provider about: Any bleeding problems you have. Any previous reactions you have had during a blood transfusion. Any allergies you have. All medicines you are taking, including vitamins, herbs, eye drops, creams, and over-the-counter medicines. Any surgeries you have had. Any medical conditions you have. Whether you are pregnant or may be pregnant. What are the risks? Talk with your health care provider about risks. The most common problems include: A mild allergic reaction, such as red, swollen areas of skin (hives) and itching. Fever or chills. This may be the body's response to new blood cells received. This may occur during or up to 4 hours after the transfusion. More serious problems may include: A serious allergic reaction that causes difficulty breathing or swelling around the face and lips. Transfusion-associated circulatory overload (TACO), or too much fluid in  the lungs. This may cause breathing problems. Transfusion-related acute lung injury (TRALI), which causes breathing difficulty and low oxygen in the blood. This can occur within hours of the transfusion or several days later. Iron overload. This can happen after receiving many blood transfusions over a period of time. Infection or virus being transmitted. This is rare because donated blood is carefully tested before it is given. Hemolytic transfusion reaction. This is rare. It happens when the body's defense system (immune system)tries to attack the new blood cells. Symptoms may include fever, chills, nausea, low blood pressure, and low back or chest pain. Transfusion-associated graft-versus-host disease (TAGVHD). This is rare. It happens when donated cells attack the body's healthy tissues. What happens before the procedure? You will have a blood test to check your blood type. This test is done to know what kind of blood your body will accept and to match it to the donor blood. If you are going to have a planned surgery, you may be able to do an autologous blood donation. This may be done in case you need to have a transfusion. You will have your temperature, blood pressure, and pulse checked before the transfusion. If you have had an allergic reaction to a transfusion in the past, you may be given medicine to help prevent a reaction. This medicine may be given to you by mouth (orally) or through an IV. What happens during the procedure?  An IV will be inserted into one of your veins. The bag of blood will be attached to your IV. The blood will then enter through your vein. Your temperature, blood pressure,  and pulse will be monitored during the transfusion. This monitoring is done to detect early signs of a transfusion reaction. Tell your nurse right away if you have any of these symptoms during the transfusion: Shortness of breath or trouble breathing. Chest or back pain. Fever or  chills. Itching or hives. If you have any signs or symptoms of a reaction, your transfusion will be stopped and you may be given medicine. When the transfusion is complete, your IV will be removed. Pressure may be applied to the IV site for a few minutes. A bandage (dressing)will be applied. The procedure may vary among health care providers and hospitals. What happens after the procedure? Your temperature, blood pressure, pulse, breathing rate, and blood oxygen level will be monitored until you leave the hospital or clinic. Your blood may be tested to see how you have responded to the transfusion. You may be warmed with fluids or blankets to maintain a normal body temperature. If you receive your blood transfusion in an outpatient setting, you will be told whom to contact to report any reactions. Where to find more information Visit the American Red Cross: redcross.org Summary A blood transfusion is a procedure in which you receive blood or a type of blood cell (blood component) through an IV. The blood given in a transfusion may be made up of different blood components. You may receive red blood cells, platelets, plasma, or white blood cells depending on the condition treated. Your temperature, blood pressure, and pulse will be monitored before, during, and after the transfusion. After the transfusion, your blood may be tested to see how your body has responded. This information is not intended to replace advice given to you by your health care provider. Make sure you discuss any questions you have with your health care provider. Document Revised: 05/10/2021 Document Reviewed: 05/10/2021 Elsevier Patient Education  2024 [employer].

## 2023-08-12 LAB — BPAM RBC
Blood Product Expiration Date: 202507142359
ISSUE DATE / TIME: 202506170734
Unit Type and Rh: 5100

## 2023-08-12 LAB — TYPE AND SCREEN
ABO/RH(D): O POS
Antibody Screen: NEGATIVE
Unit division: 0

## 2023-08-13 ENCOUNTER — Inpatient Hospital Stay

## 2023-08-13 ENCOUNTER — Telehealth: Payer: Self-pay

## 2023-08-13 DIAGNOSIS — D46Z Other myelodysplastic syndromes: Secondary | ICD-10-CM

## 2023-08-13 DIAGNOSIS — D4621 Refractory anemia with excess of blasts 1: Secondary | ICD-10-CM

## 2023-08-13 DIAGNOSIS — Z79899 Other long term (current) drug therapy: Secondary | ICD-10-CM | POA: Diagnosis not present

## 2023-08-13 DIAGNOSIS — Z5111 Encounter for antineoplastic chemotherapy: Secondary | ICD-10-CM | POA: Diagnosis not present

## 2023-08-13 LAB — CBC WITH DIFFERENTIAL (CANCER CENTER ONLY)
Abs Immature Granulocytes: 0 10*3/uL (ref 0.00–0.07)
Basophils Absolute: 0 10*3/uL (ref 0.0–0.1)
Basophils Relative: 0 %
Eosinophils Absolute: 0 10*3/uL (ref 0.0–0.5)
Eosinophils Relative: 2 %
HCT: 23.9 % — ABNORMAL LOW (ref 39.0–52.0)
Hemoglobin: 8.3 g/dL — ABNORMAL LOW (ref 13.0–17.0)
Immature Granulocytes: 0 %
Lymphocytes Relative: 38 %
Lymphs Abs: 0.2 10*3/uL — ABNORMAL LOW (ref 0.7–4.0)
MCH: 34.4 pg — ABNORMAL HIGH (ref 26.0–34.0)
MCHC: 34.7 g/dL (ref 30.0–36.0)
MCV: 99.2 fL (ref 80.0–100.0)
Monocytes Absolute: 0 10*3/uL — ABNORMAL LOW (ref 0.1–1.0)
Monocytes Relative: 3 %
Neutro Abs: 0.3 10*3/uL — CL (ref 1.7–7.7)
Neutrophils Relative %: 57 %
Platelet Count: 27 10*3/uL — ABNORMAL LOW (ref 150–400)
RBC: 2.41 MIL/uL — ABNORMAL LOW (ref 4.22–5.81)
RDW: 17.3 % — ABNORMAL HIGH (ref 11.5–15.5)
Smear Review: NORMAL
WBC Count: 0.6 10*3/uL — CL (ref 4.0–10.5)
nRBC: 0 % (ref 0.0–0.2)

## 2023-08-13 LAB — CMP (CANCER CENTER ONLY)
ALT: 14 U/L (ref 0–44)
AST: 19 U/L (ref 15–41)
Albumin: 4.1 g/dL (ref 3.5–5.0)
Alkaline Phosphatase: 53 U/L (ref 38–126)
Anion gap: 5 (ref 5–15)
BUN: 15 mg/dL (ref 8–23)
CO2: 27 mmol/L (ref 22–32)
Calcium: 9.3 mg/dL (ref 8.9–10.3)
Chloride: 108 mmol/L (ref 98–111)
Creatinine: 1.28 mg/dL — ABNORMAL HIGH (ref 0.61–1.24)
GFR, Estimated: 56 mL/min — ABNORMAL LOW (ref >60)
Glucose, Bld: 153 mg/dL — ABNORMAL HIGH (ref 70–99)
Potassium: 4.8 mmol/L (ref 3.5–5.1)
Sodium: 140 mmol/L (ref 135–145)
Total Bilirubin: 0.6 mg/dL (ref 0.0–1.2)
Total Protein: 5.7 g/dL — ABNORMAL LOW (ref 6.5–8.1)

## 2023-08-13 LAB — SAMPLE TO BLOOD BANK

## 2023-08-13 NOTE — Telephone Encounter (Signed)
 Received critical from ED lab Moira Andrews) stating that pt's WBC was 0.6, platelets were 27 and ANC was 0.3. Dr Maria Shiner made aware and stated no change needed.

## 2023-08-14 ENCOUNTER — Other Ambulatory Visit: Payer: Self-pay | Admitting: Radiology

## 2023-08-14 ENCOUNTER — Encounter: Payer: Self-pay | Admitting: Hematology & Oncology

## 2023-08-14 DIAGNOSIS — D4620 Refractory anemia with excess of blasts, unspecified: Secondary | ICD-10-CM
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
## 2023-08-17 ENCOUNTER — Ambulatory Visit (HOSPITAL_COMMUNITY)
Admission: RE | Admit: 2023-08-17 | Discharge: 2023-08-17 | Disposition: A | Discharge status: Home / Self Care | Source: Ambulatory Visit | Attending: Internal Medicine

## 2023-08-17 ENCOUNTER — Encounter (HOSPITAL_COMMUNITY): Payer: Self-pay

## 2023-08-17 ENCOUNTER — Inpatient Hospital Stay

## 2023-08-17 ENCOUNTER — Other Ambulatory Visit: Payer: Self-pay

## 2023-08-17 ENCOUNTER — Ambulatory Visit (HOSPITAL_COMMUNITY)
Admission: RE | Admit: 2023-08-17 | Discharge: 2023-08-17 | Disposition: A | Discharge status: Home / Self Care | Source: Ambulatory Visit | Attending: Hematology & Oncology | Admitting: Hematology & Oncology

## 2023-08-17 DIAGNOSIS — Z538 Procedure and treatment not carried out for other reasons: Secondary | ICD-10-CM | POA: Insufficient documentation

## 2023-08-17 DIAGNOSIS — D4621 Refractory anemia with excess of blasts 1: Secondary | ICD-10-CM | POA: Insufficient documentation

## 2023-08-17 DIAGNOSIS — D4620 Refractory anemia with excess of blasts, unspecified: Secondary | ICD-10-CM

## 2023-08-17 LAB — CBC WITH DIFFERENTIAL/PLATELET
Abs Immature Granulocytes: 0 10*3/uL (ref 0.00–0.07)
Basophils Absolute: 0 10*3/uL (ref 0.0–0.1)
Basophils Relative: 0 %
Eosinophils Absolute: 0 10*3/uL (ref 0.0–0.5)
Eosinophils Relative: 2 %
HCT: 24.9 % — ABNORMAL LOW (ref 39.0–52.0)
Hemoglobin: 8.4 g/dL — ABNORMAL LOW (ref 13.0–17.0)
Immature Granulocytes: 0 %
Lymphocytes Relative: 44 %
Lymphs Abs: 0.2 10*3/uL — ABNORMAL LOW (ref 0.7–4.0)
MCH: 35 pg — ABNORMAL HIGH (ref 26.0–34.0)
MCHC: 33.7 g/dL (ref 30.0–36.0)
MCV: 103.8 fL — ABNORMAL HIGH (ref 80.0–100.0)
Monocytes Absolute: 0 10*3/uL — ABNORMAL LOW (ref 0.1–1.0)
Monocytes Relative: 8 %
Neutro Abs: 0.2 10*3/uL — CL (ref 1.7–7.7)
Neutrophils Relative %: 46 %
Platelets: 42 10*3/uL — ABNORMAL LOW (ref 150–400)
RBC: 2.4 MIL/uL — ABNORMAL LOW (ref 4.22–5.81)
RDW: 18.4 % — ABNORMAL HIGH (ref 11.5–15.5)
WBC: 0.5 10*3/uL — CL (ref 4.0–10.5)
nRBC: 0 % (ref 0.0–0.2)

## 2023-08-17 MED ORDER — SODIUM CHLORIDE 0.9 % IV SOLN: INTRAVENOUS | Status: DC

## 2023-08-17 NOTE — Progress Notes (Signed)
 1348 Procedure cancelled due to abnormal lab result.  Will reschedule per Franky Rakers, PA., over to speak with Mr. Pennella. 1400 Discharged with wife to car.

## 2023-08-17 NOTE — Clinical Note (Incomplete)
1348

## 2023-08-17 NOTE — Progress Notes (Signed)
 1348 Procedure cancelled due to abnormal CBCwdiff and plate result today, will reschedule. 1350 IV removed and Patrick [name] was discharged home with wife to his car.

## 2023-08-17 NOTE — Progress Notes (Signed)
 1348 Procedure cancelled for today due to abnormal lab work CBCw diff and plat.  Will reschedule per Franky [facility].

## 2023-08-18 ENCOUNTER — Telehealth: Payer: Self-pay | Admitting: *Deleted

## 2023-08-18 ENCOUNTER — Encounter: Payer: Self-pay | Admitting: Hematology & Oncology

## 2023-08-18 NOTE — Telephone Encounter (Signed)
 MyChart message sent to pt

## 2023-08-20 ENCOUNTER — Telehealth: Payer: Self-pay | Admitting: *Deleted

## 2023-08-20 ENCOUNTER — Inpatient Hospital Stay

## 2023-08-20 DIAGNOSIS — Z5111 Encounter for antineoplastic chemotherapy: Secondary | ICD-10-CM | POA: Diagnosis not present

## 2023-08-20 DIAGNOSIS — Z79899 Other long term (current) drug therapy: Secondary | ICD-10-CM | POA: Diagnosis not present

## 2023-08-20 DIAGNOSIS — D46Z Other myelodysplastic syndromes: Secondary | ICD-10-CM

## 2023-08-20 DIAGNOSIS — D4621 Refractory anemia with excess of blasts 1: Secondary | ICD-10-CM

## 2023-08-20 LAB — CBC WITH DIFFERENTIAL (CANCER CENTER ONLY)
Abs Immature Granulocytes: 0.04 10*3/uL (ref 0.00–0.07)
Basophils Absolute: 0 10*3/uL (ref 0.0–0.1)
Basophils Relative: 2 %
Eosinophils Absolute: 0 10*3/uL (ref 0.0–0.5)
Eosinophils Relative: 2 %
HCT: 25.5 % — ABNORMAL LOW (ref 39.0–52.0)
Hemoglobin: 8.6 g/dL — ABNORMAL LOW (ref 13.0–17.0)
Immature Granulocytes: 9 %
Lymphocytes Relative: 48 %
Lymphs Abs: 0.2 10*3/uL — ABNORMAL LOW (ref 0.7–4.0)
MCH: 34.1 pg — ABNORMAL HIGH (ref 26.0–34.0)
MCHC: 33.7 g/dL (ref 30.0–36.0)
MCV: 101.2 fL — ABNORMAL HIGH (ref 80.0–100.0)
Monocytes Absolute: 0 10*3/uL — ABNORMAL LOW (ref 0.1–1.0)
Monocytes Relative: 9 %
Neutro Abs: 0.1 10*3/uL — CL (ref 1.7–7.7)
Neutrophils Relative %: 30 %
Platelet Count: 54 10*3/uL — ABNORMAL LOW (ref 150–400)
RBC: 2.52 MIL/uL — ABNORMAL LOW (ref 4.22–5.81)
RDW: 18.8 % — ABNORMAL HIGH (ref 11.5–15.5)
Smear Review: NORMAL
WBC Count: 0.5 10*3/uL — CL (ref 4.0–10.5)
nRBC: 0 % (ref 0.0–0.2)

## 2023-08-20 LAB — CMP (CANCER CENTER ONLY)
ALT: 15 U/L (ref 0–44)
AST: 10 U/L — ABNORMAL LOW (ref 15–41)
Albumin: 4.3 g/dL (ref 3.5–5.0)
Alkaline Phosphatase: 46 U/L (ref 38–126)
Anion gap: 5 (ref 5–15)
BUN: 18 mg/dL (ref 8–23)
CO2: 28 mmol/L (ref 22–32)
Calcium: 9.4 mg/dL (ref 8.9–10.3)
Chloride: 108 mmol/L (ref 98–111)
Creatinine: 1.18 mg/dL (ref 0.61–1.24)
GFR, Estimated: >60 mL/min (ref >60)
Glucose, Bld: 130 mg/dL — ABNORMAL HIGH (ref 70–99)
Potassium: 4.8 mmol/L (ref 3.5–5.1)
Sodium: 141 mmol/L (ref 135–145)
Total Bilirubin: 0.5 mg/dL (ref 0.0–1.2)
Total Protein: 5.6 g/dL — ABNORMAL LOW (ref 6.5–8.1)

## 2023-08-20 LAB — SAMPLE TO BLOOD BANK

## 2023-08-20 NOTE — Telephone Encounter (Signed)
 [facility] ER lab called with critical lab values: WBC 0.5, Platelet 54, ANC 0.1. Results given to MD.

## 2023-08-24 ENCOUNTER — Telehealth: Payer: Self-pay | Admitting: *Deleted

## 2023-08-24 ENCOUNTER — Encounter: Payer: Self-pay | Admitting: Hematology & Oncology

## 2023-08-24 ENCOUNTER — Inpatient Hospital Stay (HOSPITAL_BASED_OUTPATIENT_CLINIC_OR_DEPARTMENT_OTHER): Admitting: Hematology & Oncology

## 2023-08-24 ENCOUNTER — Inpatient Hospital Stay

## 2023-08-24 ENCOUNTER — Other Ambulatory Visit: Payer: Self-pay

## 2023-08-24 VITALS — BP 105/54 | HR 49 | Resp 18

## 2023-08-24 VITALS — BP 124/51 | HR 52 | Temp 98.1°F | Resp 18 | Ht 72.0 in | Wt 168.0 lb

## 2023-08-24 DIAGNOSIS — D46Z Other myelodysplastic syndromes: Secondary | ICD-10-CM | POA: Diagnosis not present

## 2023-08-24 DIAGNOSIS — Z5111 Encounter for antineoplastic chemotherapy: Secondary | ICD-10-CM | POA: Diagnosis not present

## 2023-08-24 DIAGNOSIS — D4621 Refractory anemia with excess of blasts 1: Secondary | ICD-10-CM

## 2023-08-24 DIAGNOSIS — Z79899 Other long term (current) drug therapy: Secondary | ICD-10-CM | POA: Diagnosis not present

## 2023-08-24 LAB — CBC WITH DIFFERENTIAL (CANCER CENTER ONLY)
Abs Immature Granulocytes: 0.01 10*3/uL (ref 0.00–0.07)
Basophils Absolute: 0 10*3/uL (ref 0.0–0.1)
Basophils Relative: 2 %
Eosinophils Absolute: 0 10*3/uL (ref 0.0–0.5)
Eosinophils Relative: 2 %
HCT: 27.3 % — ABNORMAL LOW (ref 39.0–52.0)
Hemoglobin: 9.5 g/dL — ABNORMAL LOW (ref 13.0–17.0)
Immature Granulocytes: 2 %
Lymphocytes Relative: 56 %
Lymphs Abs: 0.3 10*3/uL — ABNORMAL LOW (ref 0.7–4.0)
MCH: 35.6 pg — ABNORMAL HIGH (ref 26.0–34.0)
MCHC: 34.8 g/dL (ref 30.0–36.0)
MCV: 102.2 fL — ABNORMAL HIGH (ref 80.0–100.0)
Monocytes Absolute: 0 10*3/uL — ABNORMAL LOW (ref 0.1–1.0)
Monocytes Relative: 6 %
Neutro Abs: 0.2 10*3/uL — CL (ref 1.7–7.7)
Neutrophils Relative %: 32 %
Platelet Count: 84 10*3/uL — ABNORMAL LOW (ref 150–400)
RBC: 2.67 MIL/uL — ABNORMAL LOW (ref 4.22–5.81)
RDW: 19.6 % — ABNORMAL HIGH (ref 11.5–15.5)
Smear Review: NORMAL
WBC Count: 0.5 10*3/uL — CL (ref 4.0–10.5)
nRBC: 0 % (ref 0.0–0.2)

## 2023-08-24 LAB — CMP (CANCER CENTER ONLY)
ALT: 13 U/L (ref 0–44)
AST: 10 U/L — ABNORMAL LOW (ref 15–41)
Albumin: 4.3 g/dL (ref 3.5–5.0)
Alkaline Phosphatase: 50 U/L (ref 38–126)
Anion gap: 6 (ref 5–15)
BUN: 16 mg/dL (ref 8–23)
CO2: 28 mmol/L (ref 22–32)
Calcium: 9.5 mg/dL (ref 8.9–10.3)
Chloride: 107 mmol/L (ref 98–111)
Creatinine: 1.26 mg/dL — ABNORMAL HIGH (ref 0.61–1.24)
GFR, Estimated: 57 mL/min — ABNORMAL LOW (ref >60)
Glucose, Bld: 152 mg/dL — ABNORMAL HIGH (ref 70–99)
Potassium: 4.7 mmol/L (ref 3.5–5.1)
Sodium: 141 mmol/L (ref 135–145)
Total Bilirubin: 0.6 mg/dL (ref 0.0–1.2)
Total Protein: 6.2 g/dL — ABNORMAL LOW (ref 6.5–8.1)

## 2023-08-24 LAB — SAVE SMEAR(SSMR), FOR PROVIDER SLIDE REVIEW

## 2023-08-24 LAB — SAMPLE TO BLOOD BANK

## 2023-08-24 LAB — LACTATE DEHYDROGENASE: LDH: 191 U/L (ref 98–192)

## 2023-08-24 MED ORDER — SODIUM CHLORIDE 0.9 % IV SOLN: INTRAVENOUS | Status: DC

## 2023-08-24 MED ORDER — SODIUM CHLORIDE 0.9 % IV SOLN
75.0000 mg/m2 | Freq: Once | INTRAVENOUS | Status: AC
  Administered 2023-08-24: 148 mg via INTRAVENOUS
  Filled 2023-08-24: qty 14.8

## 2023-08-24 MED ORDER — ONDANSETRON HCL 8 MG PO TABS: 8.0000 mg | ORAL_TABLET | Freq: Once | ORAL | Status: DC

## 2023-08-24 NOTE — Telephone Encounter (Signed)
 Received call from [facility] in [facility] lab with critical lab values.  WBC 0.5, ANC 0.2. Platelets are 73 and HGB is 9.5. Results given to MD.

## 2023-08-24 NOTE — Progress Notes (Signed)
 9 Hematology and Oncology Follow Up Visit  [name] [id number] 12-Nov-1940 83 y.o. 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  He, unfortunate, cannot have a Port-A-Cath because his white cell count is so low.  His platelet count is starting to come up better.  I would like to hope that this is a indicator that things are working.  We have not had to transfuse him.  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  He has been in contact with [facility].  I think it would not be a bad idea if he went down for a second opinion.  However, I told him that I would probably wait till after 4 cycles of treatment and then we can evaluate him and then see how everything looks.  He has had no fever.  He has had no bleeding.  Has had no rashes.  Has had no leg swelling.  Overall, I would have to say that his performance status by ECOG 1.      Medications:  Current Outpatient Medications:    tamsulosin  (FLOMAX ) 0.4 MG CAPS capsule, Take 0.4 mg by mouth daily., Disp: , Rfl:    amLODipine  (NORVASC ) 5 MG tablet, TAKE ONE TABLET BY MOUTH ONE TIME DAILY, Disp: 90 tablet, Rfl: 3   atorvastatin  (LIPITOR) 10 MG tablet, TAKE ONE TABLET BY MOUTH ONE TIME DAILY, Disp: 90 tablet, Rfl: 2   diphenoxylate -atropine  (LOMOTIL ) 2.5-0.025 MG tablet, Take 1 tablet by mouth 4 (four) times daily as needed for diarrhea or loose stools., Disp: 60 tablet, Rfl: 1   enoxaparin  (LOVENOX ) 120 MG/0.8ML injection, Inject 0.8 mLs (120 mg total) into the skin daily., Disp: 24 mL, Rfl: 12   famciclovir  (FAMVIR ) 250 MG tablet, Take 1 tablet (250 mg total) by mouth daily., Disp: 30 tablet, Rfl: 12   hydrocortisone 2.5 % cream, Apply 1 Application  topically daily as needed., Disp: , Rfl:    itraconazole  (SPORANOX ) 100 MG capsule, Take 1 capsule (100 mg total) by mouth daily., Disp: 30 capsule, Rfl: 6   levofloxacin  (LEVAQUIN ) 250 MG tablet, Take 1 tablet (250 mg total) by mouth daily., Disp: 30 tablet, Rfl: 4   lidocaine -prilocaine  (EMLA ) cream, Apply 1 Application topically as needed. Place one hour prior to port access and cover with plastic wrap, Disp: 30 g, Rfl: 0   pantoprazole  (PROTONIX ) 40 MG tablet, TAKE ONE TABLET BY MOUTH ONE TIME DAILY, Disp: 90 tablet, Rfl: 3   SUMAtriptan  (IMITREX ) 25 MG tablet, TAKE ONE TABLET BY MOUTH AT ONSET OF HEADACHE. MAY REPEAT DOSE WITH ONE TABLET IN TWO HOURS IF NEEDED. DO NOT EXCEED TWO TABLETS IN 24 HOURS. (Patient not taking: Reported on 08/04/2023), Disp: 12 tablet, Rfl: 0   tadalafil  (CIALIS ) 5 MG tablet, Take 1 tablet (5 mg total) by mouth daily., Disp: 90 tablet, Rfl: 3  Allergies:  Allergies  Allergen Reactions   Codeine  Rash   Terbinafine Itching    Past Medical History, Surgical history, Social history, and Family History were reviewed and updated.  Review of Systems: Review of Systems  Constitutional: Negative.   HENT:  Negative.    Eyes: Negative.   Respiratory: Negative.  Cardiovascular: Negative.   Gastrointestinal: Negative.   Endocrine: Negative.   Genitourinary: Negative.    Musculoskeletal: Negative.   Skin: Negative.   Neurological: Negative.   Hematological: Negative.   Psychiatric/Behavioral: Negative.      Physical Exam:  height is 6' (1.829 m) and weight is 168 lb (76.2 kg). His oral temperature is 98.1 F (36.7 C). His blood pressure is 124/51 (abnormal) and his pulse is 52 (abnormal). His respiration is 18 and oxygen saturation is 100%.   Wt Readings from Last 3 Encounters:  08/24/23 168 lb (76.2 kg)  08/17/23 170 lb (77.1 kg)  07/27/23 170 lb (77.1 kg)    Physical Exam Vitals reviewed.  HENT:     Head: Normocephalic and atraumatic.   Eyes:      Pupils: Pupils are equal, round, and reactive to light.     Comments: Ocular exam does show the conjunctival hemorrhage with the right eye.  Pupil reacts appropriately.  He has good extraocular muscle movement.   Cardiovascular:     Rate and Rhythm: Normal rate and regular rhythm.     Heart sounds: Normal heart sounds.     Comments: Cardiac exam shows a regular rate and rhythm.  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  I hear no murmurs or rubs or bruits. Pulmonary:     Effort: Pulmonary effort is normal.     Breath sounds: Normal breath sounds.  Abdominal:     General: Bowel sounds are normal.     Palpations: Abdomen is soft.   Musculoskeletal:        General: No tenderness or deformity. Normal range of motion.     Cervical back: Normal range of motion.  Lymphadenopathy:     Cervical: No cervical adenopathy.   Skin:    General: Skin is warm and dry.     Findings: No erythema or rash.   Neurological:     Mental Status: He is alert and oriented to person, place, and time.   Psychiatric:        Behavior: Behavior normal.        Thought Content: Thought content normal.        Judgment: Judgment normal.     Lab Results  Component Value Date   WBC 0.5 (LL) 08/20/2023   HGB 8.6 (L) 08/20/2023   HCT 25.5 (L) 08/20/2023   MCV 101.2 (H) 08/20/2023   PLT 54 (L) 08/20/2023     Chemistry      Component Value Date/Time   NA 141 08/24/2023 1003   NA 139 11/12/2020 1030   K 4.7 08/24/2023 1003   CL 107 08/24/2023 1003   CO2 28 08/24/2023 1003   BUN 16 08/24/2023 1003   BUN 16 11/12/2020 1030   CREATININE 1.26 (H) 08/24/2023 1003      Component Value Date/Time   CALCIUM  9.5 08/24/2023 1003   ALKPHOS 50 08/24/2023 1003   AST 10 (L) 08/24/2023 1003   ALT 13 08/24/2023 1003   BILITOT 0.6 08/24/2023 1003       Impression and Plan: [name] is a 83 year old white male.  He has mild leukopenia and thrombocytopenia. I did do a bone marrow biopsy on  him.  This confirmed that he has myelodysplasia.  Again, I would classify him as refractory anemia with excess blast-1  (RAEB-1).  This with his third cycle of treatment.  We are following his blood counts twice a week.  I think this is appropriate.  Again I would like to hope that we are starting to see a response.  I will plan to see him back when he has his fourth cycle of treatment.  Maude JONELLE Crease, MD 6/30/202511:06 AM

## 2023-08-24 NOTE — Progress Notes (Signed)
 Reviewed pt labs with Dr. Myna Hidalgo and pt ok to treat despite counts.

## 2023-08-24 NOTE — Patient Instructions (Signed)
 [facility] - A DEPT OF [facility]  Discharge Instructions: Thank you for choosing [facility] to provide your oncology and hematology care.   If you have a lab appointment with the Cancer Center, please go directly to the Cancer Center and check in at the registration area.  Wear comfortable clothing and clothing appropriate for easy access to any Portacath or PICC line.   We strive to give you quality time with your provider. You may need to reschedule your appointment if you arrive late (15 or more minutes).  Arriving late affects you and other patients whose appointments are after yours.  Also, if you miss three or more appointments without notifying the office, you may be dismissed from the clinic at the provider's discretion.      For prescription refill requests, have your pharmacy contact our office and allow 72 hours for refills to be completed.    Today you received the following chemotherapy and/or immunotherapy agents:  Vidaza      To help prevent nausea and vomiting after your treatment, we encourage you to take your nausea medication as directed.  BELOW ARE SYMPTOMS THAT SHOULD BE REPORTED IMMEDIATELY: *FEVER GREATER THAN 100.4 F (38 C) OR HIGHER *CHILLS OR SWEATING *NAUSEA AND VOMITING THAT IS NOT CONTROLLED WITH YOUR NAUSEA MEDICATION *UNUSUAL SHORTNESS OF BREATH *UNUSUAL BRUISING OR BLEEDING *URINARY PROBLEMS (pain or burning when urinating, or frequent urination) *BOWEL PROBLEMS (unusual diarrhea, constipation, pain near the anus) TENDERNESS IN MOUTH AND THROAT WITH OR WITHOUT PRESENCE OF ULCERS (sore throat, sores in mouth, or a toothache) UNUSUAL RASH, SWELLING OR PAIN  UNUSUAL VAGINAL DISCHARGE OR ITCHING   Items with * indicate a potential emergency and should be followed up as soon as possible or go to the Emergency Department if any problems should occur.  Please show the CHEMOTHERAPY ALERT CARD or IMMUNOTHERAPY  ALERT CARD at check-in to the Emergency Department and triage nurse. Should you have questions after your visit or need to cancel or reschedule your appointment, please contact [facility] - A DEPT OF [facility]  [phone] and follow the prompts.  Office hours are 8:00 a.m. to 4:30 p.m. Monday - Friday. Please note that voicemails left after 4:00 p.m. may not be returned until the following business day.  We are closed weekends and major holidays. You have access to a nurse at all times for urgent questions. Please call the main number to the clinic [phone] and follow the prompts.  For any non-urgent questions, you may also contact your provider using MyChart. We now offer e-Visits for anyone 30 and older to request care online for non-urgent symptoms. For details visit [email or web address].   Also download the MyChart app! Go to the app store, search "MyChart", open the app, select [place], and log in with your MyChart username and password.

## 2023-08-24 NOTE — Progress Notes (Signed)
 Due to 7/4 holiday, patient will receive 6 days of azacitidine  with this cycle per Dr. Timmy. Day 5 deleted from cycle 2 per his instructions.

## 2023-08-25 ENCOUNTER — Inpatient Hospital Stay: Attending: Hematology & Oncology

## 2023-08-25 VITALS — BP 103/52 | HR 54 | Temp 97.6°F | Resp 18

## 2023-08-25 DIAGNOSIS — Z79899 Other long term (current) drug therapy: Secondary | ICD-10-CM | POA: Diagnosis not present

## 2023-08-25 DIAGNOSIS — D4621 Refractory anemia with excess of blasts 1: Secondary | ICD-10-CM | POA: Insufficient documentation

## 2023-08-25 DIAGNOSIS — Z5111 Encounter for antineoplastic chemotherapy: Secondary | ICD-10-CM | POA: Diagnosis not present

## 2023-08-25 DIAGNOSIS — D46Z Other myelodysplastic syndromes: Secondary | ICD-10-CM

## 2023-08-25 MED ORDER — SODIUM CHLORIDE 0.9 % IV SOLN
75.0000 mg/m2 | Freq: Once | INTRAVENOUS | Status: AC
  Administered 2023-08-25: 148 mg via INTRAVENOUS
  Filled 2023-08-25: qty 14.8

## 2023-08-25 MED ORDER — ONDANSETRON HCL 8 MG PO TABS: 8.0000 mg | ORAL_TABLET | Freq: Once | ORAL | Status: DC

## 2023-08-25 MED ORDER — SODIUM CHLORIDE 0.9 % IV SOLN: INTRAVENOUS | Status: DC

## 2023-08-25 NOTE — Patient Instructions (Signed)
 [facility] - A DEPT OF [facility]  Discharge Instructions: Thank you for choosing [facility] to provide your oncology and hematology care.   If you have a lab appointment with the Cancer Center, please go directly to the Cancer Center and check in at the registration area.  Wear comfortable clothing and clothing appropriate for easy access to any Portacath or PICC line.   We strive to give you quality time with your provider. You may need to reschedule your appointment if you arrive late (15 or more minutes).  Arriving late affects you and other patients whose appointments are after yours.  Also, if you miss three or more appointments without notifying the office, you may be dismissed from the clinic at the provider's discretion.      For prescription refill requests, have your pharmacy contact our office and allow 72 hours for refills to be completed.    Today you received the following chemotherapy and/or immunotherapy agents:  Vidaza      To help prevent nausea and vomiting after your treatment, we encourage you to take your nausea medication as directed.  BELOW ARE SYMPTOMS THAT SHOULD BE REPORTED IMMEDIATELY: *FEVER GREATER THAN 100.4 F (38 C) OR HIGHER *CHILLS OR SWEATING *NAUSEA AND VOMITING THAT IS NOT CONTROLLED WITH YOUR NAUSEA MEDICATION *UNUSUAL SHORTNESS OF BREATH *UNUSUAL BRUISING OR BLEEDING *URINARY PROBLEMS (pain or burning when urinating, or frequent urination) *BOWEL PROBLEMS (unusual diarrhea, constipation, pain near the anus) TENDERNESS IN MOUTH AND THROAT WITH OR WITHOUT PRESENCE OF ULCERS (sore throat, sores in mouth, or a toothache) UNUSUAL RASH, SWELLING OR PAIN  UNUSUAL VAGINAL DISCHARGE OR ITCHING   Items with * indicate a potential emergency and should be followed up as soon as possible or go to the Emergency Department if any problems should occur.  Please show the CHEMOTHERAPY ALERT CARD or IMMUNOTHERAPY  ALERT CARD at check-in to the Emergency Department and triage nurse. Should you have questions after your visit or need to cancel or reschedule your appointment, please contact [facility] - A DEPT OF [facility]  [phone] and follow the prompts.  Office hours are 8:00 a.m. to 4:30 p.m. Monday - Friday. Please note that voicemails left after 4:00 p.m. may not be returned until the following business day.  We are closed weekends and major holidays. You have access to a nurse at all times for urgent questions. Please call the main number to the clinic [phone] and follow the prompts.  For any non-urgent questions, you may also contact your provider using MyChart. We now offer e-Visits for anyone 30 and older to request care online for non-urgent symptoms. For details visit [email or web address].   Also download the MyChart app! Go to the app store, search "MyChart", open the app, select [place], and log in with your MyChart username and password.

## 2023-08-26 ENCOUNTER — Inpatient Hospital Stay

## 2023-08-26 ENCOUNTER — Other Ambulatory Visit: Payer: Self-pay | Admitting: Cardiovascular Disease

## 2023-08-26 VITALS — BP 105/45 | HR 54 | Temp 97.9°F | Resp 19

## 2023-08-26 DIAGNOSIS — D46Z Other myelodysplastic syndromes: Secondary | ICD-10-CM

## 2023-08-26 DIAGNOSIS — D4621 Refractory anemia with excess of blasts 1: Secondary | ICD-10-CM | POA: Diagnosis not present

## 2023-08-26 DIAGNOSIS — Z79899 Other long term (current) drug therapy: Secondary | ICD-10-CM | POA: Diagnosis not present

## 2023-08-26 DIAGNOSIS — Z5111 Encounter for antineoplastic chemotherapy: Secondary | ICD-10-CM | POA: Diagnosis not present

## 2023-08-26 LAB — CMP (CANCER CENTER ONLY)
ALT: 14 U/L (ref 0–44)
AST: 12 U/L — ABNORMAL LOW (ref 15–41)
Albumin: 4 g/dL (ref 3.5–5.0)
Alkaline Phosphatase: 44 U/L (ref 38–126)
Anion gap: 4 — ABNORMAL LOW (ref 5–15)
BUN: 20 mg/dL (ref 8–23)
CO2: 28 mmol/L (ref 22–32)
Calcium: 9.5 mg/dL (ref 8.9–10.3)
Chloride: 106 mmol/L (ref 98–111)
Creatinine: 1.3 mg/dL — ABNORMAL HIGH (ref 0.61–1.24)
GFR, Estimated: 55 mL/min — ABNORMAL LOW (ref >60)
Glucose, Bld: 122 mg/dL — ABNORMAL HIGH (ref 70–99)
Potassium: 4.2 mmol/L (ref 3.5–5.1)
Sodium: 138 mmol/L (ref 135–145)
Total Bilirubin: 0.5 mg/dL (ref 0.0–1.2)
Total Protein: 5.6 g/dL — ABNORMAL LOW (ref 6.5–8.1)

## 2023-08-26 LAB — MAGNESIUM: Magnesium: 2.2 mg/dL (ref 1.7–2.4)

## 2023-08-26 MED ORDER — SODIUM CHLORIDE 0.9 % IV SOLN: INTRAVENOUS | Status: DC

## 2023-08-26 MED ORDER — ONDANSETRON HCL 8 MG PO TABS: 8.0000 mg | ORAL_TABLET | Freq: Once | ORAL | Status: DC

## 2023-08-26 MED ORDER — SODIUM CHLORIDE 0.9 % IV SOLN
75.0000 mg/m2 | Freq: Once | INTRAVENOUS | Status: AC
  Administered 2023-08-26: 148 mg via INTRAVENOUS
  Filled 2023-08-26: qty 14.8

## 2023-08-26 NOTE — Progress Notes (Signed)
 Patient complains of diarrhea after each treatment. Patient is able to take lomotil  and experience relief. However, patient is concerned about becoming dehydrated. Lauraine Dais, PA notified. Order given to draw CMP and Magnesium . Order given and carried out for 500 mL Normal Saline over 1 hour. Patient verbalized understanding to above mentioned plan.

## 2023-08-27 ENCOUNTER — Inpatient Hospital Stay

## 2023-08-27 VITALS — BP 102/55 | HR 48 | Temp 97.8°F | Resp 17

## 2023-08-27 DIAGNOSIS — D4621 Refractory anemia with excess of blasts 1: Secondary | ICD-10-CM

## 2023-08-27 DIAGNOSIS — D46Z Other myelodysplastic syndromes: Secondary | ICD-10-CM

## 2023-08-27 DIAGNOSIS — Z79899 Other long term (current) drug therapy: Secondary | ICD-10-CM | POA: Diagnosis not present

## 2023-08-27 DIAGNOSIS — Z5111 Encounter for antineoplastic chemotherapy: Secondary | ICD-10-CM | POA: Diagnosis not present

## 2023-08-27 LAB — CMP (CANCER CENTER ONLY)
ALT: 15 U/L (ref 0–44)
AST: 11 U/L — ABNORMAL LOW (ref 15–41)
Albumin: 4 g/dL (ref 3.5–5.0)
Alkaline Phosphatase: 47 U/L (ref 38–126)
Anion gap: 5 (ref 5–15)
BUN: 22 mg/dL (ref 8–23)
CO2: 27 mmol/L (ref 22–32)
Calcium: 9.4 mg/dL (ref 8.9–10.3)
Chloride: 108 mmol/L (ref 98–111)
Creatinine: 1.37 mg/dL — ABNORMAL HIGH (ref 0.61–1.24)
GFR, Estimated: 52 mL/min — ABNORMAL LOW (ref >60)
Glucose, Bld: 133 mg/dL — ABNORMAL HIGH (ref 70–99)
Potassium: 4.5 mmol/L (ref 3.5–5.1)
Sodium: 140 mmol/L (ref 135–145)
Total Bilirubin: 0.4 mg/dL (ref 0.0–1.2)
Total Protein: 5.7 g/dL — ABNORMAL LOW (ref 6.5–8.1)

## 2023-08-27 LAB — CBC WITH DIFFERENTIAL (CANCER CENTER ONLY)
Abs Immature Granulocytes: 0 10*3/uL (ref 0.00–0.07)
Basophils Absolute: 0 10*3/uL (ref 0.0–0.1)
Basophils Relative: 2 %
Eosinophils Absolute: 0.1 10*3/uL (ref 0.0–0.5)
Eosinophils Relative: 16 %
HCT: 25.3 % — ABNORMAL LOW (ref 39.0–52.0)
Hemoglobin: 8.7 g/dL — ABNORMAL LOW (ref 13.0–17.0)
Immature Granulocytes: 0 %
Lymphocytes Relative: 51 %
Lymphs Abs: 0.2 10*3/uL — ABNORMAL LOW (ref 0.7–4.0)
MCH: 35.5 pg — ABNORMAL HIGH (ref 26.0–34.0)
MCHC: 34.4 g/dL (ref 30.0–36.0)
MCV: 103.3 fL — ABNORMAL HIGH (ref 80.0–100.0)
Monocytes Absolute: 0 10*3/uL — ABNORMAL LOW (ref 0.1–1.0)
Monocytes Relative: 5 %
Neutro Abs: 0.1 10*3/uL — CL (ref 1.7–7.7)
Neutrophils Relative %: 26 %
Platelet Count: 66 10*3/uL — ABNORMAL LOW (ref 150–400)
RBC: 2.45 MIL/uL — ABNORMAL LOW (ref 4.22–5.81)
RDW: 19.5 % — ABNORMAL HIGH (ref 11.5–15.5)
Smear Review: NORMAL
WBC Count: 0.4 10*3/uL — CL (ref 4.0–10.5)
nRBC: 0 % (ref 0.0–0.2)

## 2023-08-27 LAB — SAMPLE TO BLOOD BANK

## 2023-08-27 MED ORDER — SODIUM CHLORIDE 0.9 % IV SOLN
INTRAVENOUS | Status: DC
Start: 2023-08-27 — End: 2023-08-27

## 2023-08-27 MED ORDER — ONDANSETRON HCL 8 MG PO TABS: 8.0000 mg | ORAL_TABLET | Freq: Once | ORAL | Status: DC

## 2023-08-27 MED ORDER — SODIUM CHLORIDE 0.9 % IV SOLN
75.0000 mg/m2 | Freq: Once | INTRAVENOUS | Status: AC
  Administered 2023-08-27: 148 mg via INTRAVENOUS
  Filled 2023-08-27: qty 14.8

## 2023-08-27 NOTE — Patient Instructions (Signed)
 [facility] - A DEPT OF [facility]  Discharge Instructions: Thank you for choosing [facility] to provide your oncology and hematology care.   If you have a lab appointment with the Cancer Center, please go directly to the Cancer Center and check in at the registration area.  Wear comfortable clothing and clothing appropriate for easy access to any Portacath or PICC line.   We strive to give you quality time with your provider. You may need to reschedule your appointment if you arrive late (15 or more minutes).  Arriving late affects you and other patients whose appointments are after yours.  Also, if you miss three or more appointments without notifying the office, you may be dismissed from the clinic at the provider's discretion.      For prescription refill requests, have your pharmacy contact our office and allow 72 hours for refills to be completed.    Today you received the following chemotherapy and/or immunotherapy agents:  Vidaza      To help prevent nausea and vomiting after your treatment, we encourage you to take your nausea medication as directed.  BELOW ARE SYMPTOMS THAT SHOULD BE REPORTED IMMEDIATELY: *FEVER GREATER THAN 100.4 F (38 C) OR HIGHER *CHILLS OR SWEATING *NAUSEA AND VOMITING THAT IS NOT CONTROLLED WITH YOUR NAUSEA MEDICATION *UNUSUAL SHORTNESS OF BREATH *UNUSUAL BRUISING OR BLEEDING *URINARY PROBLEMS (pain or burning when urinating, or frequent urination) *BOWEL PROBLEMS (unusual diarrhea, constipation, pain near the anus) TENDERNESS IN MOUTH AND THROAT WITH OR WITHOUT PRESENCE OF ULCERS (sore throat, sores in mouth, or a toothache) UNUSUAL RASH, SWELLING OR PAIN  UNUSUAL VAGINAL DISCHARGE OR ITCHING   Items with * indicate a potential emergency and should be followed up as soon as possible or go to the Emergency Department if any problems should occur.  Please show the CHEMOTHERAPY ALERT CARD or IMMUNOTHERAPY  ALERT CARD at check-in to the Emergency Department and triage nurse. Should you have questions after your visit or need to cancel or reschedule your appointment, please contact [facility] - A DEPT OF [facility]  [phone] and follow the prompts.  Office hours are 8:00 a.m. to 4:30 p.m. Monday - Friday. Please note that voicemails left after 4:00 p.m. may not be returned until the following business day.  We are closed weekends and major holidays. You have access to a nurse at all times for urgent questions. Please call the main number to the clinic [phone] and follow the prompts.  For any non-urgent questions, you may also contact your provider using MyChart. We now offer e-Visits for anyone 30 and older to request care online for non-urgent symptoms. For details visit [email or web address].   Also download the MyChart app! Go to the app store, search "MyChart", open the app, select [place], and log in with your MyChart username and password.

## 2023-08-27 NOTE — Progress Notes (Signed)
 Critical results received from lab, WBC 0.4 and ANC 0.1. Dr. Timmy aware. No new orders.

## 2023-08-31 ENCOUNTER — Inpatient Hospital Stay

## 2023-08-31 ENCOUNTER — Telehealth: Payer: Self-pay | Admitting: *Deleted

## 2023-08-31 VITALS — BP 116/64 | HR 66 | Temp 97.8°F | Resp 19

## 2023-08-31 DIAGNOSIS — Z79899 Other long term (current) drug therapy: Secondary | ICD-10-CM | POA: Diagnosis not present

## 2023-08-31 DIAGNOSIS — D4621 Refractory anemia with excess of blasts 1: Secondary | ICD-10-CM | POA: Diagnosis not present

## 2023-08-31 DIAGNOSIS — D46Z Other myelodysplastic syndromes: Secondary | ICD-10-CM

## 2023-08-31 DIAGNOSIS — Z5111 Encounter for antineoplastic chemotherapy: Secondary | ICD-10-CM | POA: Diagnosis not present

## 2023-08-31 LAB — CBC WITH DIFFERENTIAL (CANCER CENTER ONLY)
Abs Immature Granulocytes: 0 K/uL (ref 0.00–0.07)
Basophils Absolute: 0 K/uL (ref 0.0–0.1)
Basophils Relative: 2 %
Eosinophils Absolute: 0 K/uL (ref 0.0–0.5)
Eosinophils Relative: 4 %
HCT: 25.4 % — ABNORMAL LOW (ref 39.0–52.0)
Hemoglobin: 9 g/dL — ABNORMAL LOW (ref 13.0–17.0)
Immature Granulocytes: 0 %
Lymphocytes Relative: 49 %
Lymphs Abs: 0.2 K/uL — ABNORMAL LOW (ref 0.7–4.0)
MCH: 35.6 pg — ABNORMAL HIGH (ref 26.0–34.0)
MCHC: 35.4 g/dL (ref 30.0–36.0)
MCV: 100.4 fL — ABNORMAL HIGH (ref 80.0–100.0)
Monocytes Absolute: 0 K/uL — ABNORMAL LOW (ref 0.1–1.0)
Monocytes Relative: 2 %
Neutro Abs: 0.2 K/uL — CL (ref 1.7–7.7)
Neutrophils Relative %: 43 %
Platelet Count: 53 K/uL — ABNORMAL LOW (ref 150–400)
RBC: 2.53 MIL/uL — ABNORMAL LOW (ref 4.22–5.81)
RDW: 18.8 % — ABNORMAL HIGH (ref 11.5–15.5)
Smear Review: NORMAL
WBC Count: 0.5 K/uL — CL (ref 4.0–10.5)
nRBC: 0 % (ref 0.0–0.2)

## 2023-08-31 LAB — BASIC METABOLIC PANEL - CANCER CENTER ONLY
Anion gap: 6 (ref 5–15)
BUN: 19 mg/dL (ref 8–23)
CO2: 26 mmol/L (ref 22–32)
Calcium: 9.7 mg/dL (ref 8.9–10.3)
Chloride: 107 mmol/L (ref 98–111)
Creatinine: 1.17 mg/dL (ref 0.61–1.24)
GFR, Estimated: >60 mL/min (ref >60)
Glucose, Bld: 152 mg/dL — ABNORMAL HIGH (ref 70–99)
Potassium: 4.3 mmol/L (ref 3.5–5.1)
Sodium: 139 mmol/L (ref 135–145)

## 2023-08-31 LAB — SAMPLE TO BLOOD BANK

## 2023-08-31 MED ORDER — SODIUM CHLORIDE 0.9 % IV SOLN
INTRAVENOUS | Status: DC
Start: 2023-08-31 — End: 2023-08-31

## 2023-08-31 MED ORDER — ONDANSETRON HCL 8 MG PO TABS: 8.0000 mg | ORAL_TABLET | Freq: Once | ORAL | Status: DC

## 2023-08-31 MED ORDER — SODIUM CHLORIDE 0.9 % IV SOLN
75.0000 mg/m2 | Freq: Once | INTRAVENOUS | Status: AC
  Administered 2023-08-31: 148 mg via INTRAVENOUS
  Filled 2023-08-31: qty 14.8

## 2023-08-31 NOTE — Patient Instructions (Signed)
 [facility]  Discharge Instructions: Thank you for choosing [facility] to provide your oncology and hematology care.   If you have a lab appointment with the Cancer Center, please go directly to the Cancer Center and check in at the registration area.  Wear comfortable clothing and clothing appropriate for easy access to any Portacath or PICC line.   We strive to give you quality time with your provider. You may need to reschedule your appointment if you arrive late (15 or more minutes).  Arriving late affects you and other patients whose appointments are after yours.  Also, if you miss three or more appointments without notifying the office, you may be dismissed from the clinic at the provider's discretion.      For prescription refill requests, have your pharmacy contact our office and allow 72 hours for refills to be completed.    Today you received the following chemotherapy and/or immunotherapy agents 5-AZA     To help prevent nausea and vomiting after your treatment, we encourage you to take your nausea medication as directed.  BELOW ARE SYMPTOMS THAT SHOULD BE REPORTED IMMEDIATELY: *FEVER GREATER THAN 100.4 F (38 C) OR HIGHER *CHILLS OR SWEATING *NAUSEA AND VOMITING THAT IS NOT CONTROLLED WITH YOUR NAUSEA MEDICATION *UNUSUAL SHORTNESS OF BREATH *UNUSUAL BRUISING OR BLEEDING *URINARY PROBLEMS (pain or burning when urinating, or frequent urination) *BOWEL PROBLEMS (unusual diarrhea, constipation, pain near the anus) TENDERNESS IN MOUTH AND THROAT WITH OR WITHOUT PRESENCE OF ULCERS (sore throat, sores in mouth, or a toothache) UNUSUAL RASH, SWELLING OR PAIN  UNUSUAL VAGINAL DISCHARGE OR ITCHING   Items with * indicate a potential emergency and should be followed up as soon as possible or go to the Emergency Department if any problems should occur.  Please show the CHEMOTHERAPY ALERT CARD or IMMUNOTHERAPY ALERT  CARD at check-in to the Emergency Department and triage nurse. Should you have questions after your visit or need to cancel or reschedule your appointment, please contact [facility] - A DEPT OF [facility]  [phone] and follow the prompts.  Office hours are 8:00 a.m. to 4:30 p.m. Monday - Friday. Please note that voicemails left after 4:00 p.m. may not be returned until the following business day.  We are closed weekends and major holidays. You have access to a nurse at all times for urgent questions. Please call the main number to the clinic [phone] and follow the prompts.  For any non-urgent questions, you may also contact your provider using MyChart. We now offer e-Visits for anyone 72 and older to request care online for non-urgent symptoms. For details visit [email or web address].   Also download the MyChart app! Go to the app store, search "MyChart", open the app, select [place], and log in with your MyChart username and password.

## 2023-08-31 NOTE — Telephone Encounter (Signed)
 WBC .5  ANC .2.  Providers notified.  No orders received

## 2023-09-01 ENCOUNTER — Inpatient Hospital Stay

## 2023-09-01 VITALS — BP 113/54 | HR 60 | Resp 20

## 2023-09-01 DIAGNOSIS — Z5111 Encounter for antineoplastic chemotherapy: Secondary | ICD-10-CM | POA: Diagnosis not present

## 2023-09-01 DIAGNOSIS — D4621 Refractory anemia with excess of blasts 1: Secondary | ICD-10-CM

## 2023-09-01 DIAGNOSIS — Z79899 Other long term (current) drug therapy: Secondary | ICD-10-CM | POA: Diagnosis not present

## 2023-09-01 DIAGNOSIS — D46Z Other myelodysplastic syndromes: Secondary | ICD-10-CM

## 2023-09-01 MED ORDER — SODIUM CHLORIDE 0.9 % IV SOLN: INTRAVENOUS | Status: DC

## 2023-09-01 MED ORDER — SODIUM CHLORIDE 0.9 % IV SOLN
75.0000 mg/m2 | Freq: Once | INTRAVENOUS | Status: AC
  Administered 2023-09-01: 148 mg via INTRAVENOUS
  Filled 2023-09-01: qty 14.8

## 2023-09-01 MED ORDER — ONDANSETRON HCL 8 MG PO TABS
8.0000 mg | ORAL_TABLET | Freq: Once | ORAL | Status: DC
Start: 2023-09-01 — End: 2023-09-01

## 2023-09-01 NOTE — Patient Instructions (Signed)
 [facility] - A DEPT OF [facility]  Discharge Instructions: Thank you for choosing [facility] to provide your oncology and hematology care.   If you have a lab appointment with the Cancer Center, please go directly to the Cancer Center and check in at the registration area.  Wear comfortable clothing and clothing appropriate for easy access to any Portacath or PICC line.   We strive to give you quality time with your provider. You may need to reschedule your appointment if you arrive late (15 or more minutes).  Arriving late affects you and other patients whose appointments are after yours.  Also, if you miss three or more appointments without notifying the office, you may be dismissed from the clinic at the provider's discretion.      For prescription refill requests, have your pharmacy contact our office and allow 72 hours for refills to be completed.    Today you received the following chemotherapy and/or immunotherapy agents Azacytidine      To help prevent nausea and vomiting after your treatment, we encourage you to take your nausea medication as directed.  BELOW ARE SYMPTOMS THAT SHOULD BE REPORTED IMMEDIATELY: *FEVER GREATER THAN 100.4 F (38 C) OR HIGHER *CHILLS OR SWEATING *NAUSEA AND VOMITING THAT IS NOT CONTROLLED WITH YOUR NAUSEA MEDICATION *UNUSUAL SHORTNESS OF BREATH *UNUSUAL BRUISING OR BLEEDING *URINARY PROBLEMS (pain or burning when urinating, or frequent urination) *BOWEL PROBLEMS (unusual diarrhea, constipation, pain near the anus) TENDERNESS IN MOUTH AND THROAT WITH OR WITHOUT PRESENCE OF ULCERS (sore throat, sores in mouth, or a toothache) UNUSUAL RASH, SWELLING OR PAIN  UNUSUAL VAGINAL DISCHARGE OR ITCHING   Items with * indicate a potential emergency and should be followed up as soon as possible or go to the Emergency Department if any problems should occur.  Please show the CHEMOTHERAPY ALERT CARD or IMMUNOTHERAPY  ALERT CARD at check-in to the Emergency Department and triage nurse. Should you have questions after your visit or need to cancel or reschedule your appointment, please contact [facility] - A DEPT OF [facility]  [phone] and follow the prompts.  Office hours are 8:00 a.m. to 4:30 p.m. Monday - Friday. Please note that voicemails left after 4:00 p.m. may not be returned until the following business day.  We are closed weekends and major holidays. You have access to a nurse at all times for urgent questions. Please call the main number to the clinic [phone] and follow the prompts.  For any non-urgent questions, you may also contact your provider using MyChart. We now offer e-Visits for anyone 9 and older to request care online for non-urgent symptoms. For details visit [email or web address].   Also download the MyChart app! Go to the app store, search "MyChart", open the app, select [place], and log in with your MyChart username and password.

## 2023-09-03 ENCOUNTER — Inpatient Hospital Stay

## 2023-09-03 ENCOUNTER — Encounter: Payer: Self-pay | Admitting: Hematology & Oncology

## 2023-09-03 ENCOUNTER — Telehealth: Payer: Self-pay | Admitting: *Deleted

## 2023-09-03 DIAGNOSIS — D4621 Refractory anemia with excess of blasts 1: Secondary | ICD-10-CM | POA: Diagnosis not present

## 2023-09-03 DIAGNOSIS — Z79899 Other long term (current) drug therapy: Secondary | ICD-10-CM | POA: Diagnosis not present

## 2023-09-03 DIAGNOSIS — Z5111 Encounter for antineoplastic chemotherapy: Secondary | ICD-10-CM | POA: Diagnosis not present

## 2023-09-03 DIAGNOSIS — D46Z Other myelodysplastic syndromes: Secondary | ICD-10-CM

## 2023-09-03 LAB — CBC WITH DIFFERENTIAL (CANCER CENTER ONLY)
Abs Immature Granulocytes: 0 K/uL (ref 0.00–0.07)
Basophils Absolute: 0 K/uL (ref 0.0–0.1)
Basophils Relative: 0 %
Eosinophils Absolute: 0.1 K/uL (ref 0.0–0.5)
Eosinophils Relative: 10 %
HCT: 25 % — ABNORMAL LOW (ref 39.0–52.0)
Hemoglobin: 8.8 g/dL — ABNORMAL LOW (ref 13.0–17.0)
Immature Granulocytes: 0 %
Lymphocytes Relative: 50 %
Lymphs Abs: 0.3 K/uL — ABNORMAL LOW (ref 0.7–4.0)
MCH: 35.3 pg — ABNORMAL HIGH (ref 26.0–34.0)
MCHC: 35.2 g/dL (ref 30.0–36.0)
MCV: 100.4 fL — ABNORMAL HIGH (ref 80.0–100.0)
Monocytes Absolute: 0 K/uL — ABNORMAL LOW (ref 0.1–1.0)
Monocytes Relative: 2 %
Neutro Abs: 0.2 K/uL — CL (ref 1.7–7.7)
Neutrophils Relative %: 38 %
Platelet Count: 43 K/uL — ABNORMAL LOW (ref 150–400)
RBC: 2.49 MIL/uL — ABNORMAL LOW (ref 4.22–5.81)
RDW: 18.7 % — ABNORMAL HIGH (ref 11.5–15.5)
Smear Review: NORMAL
WBC Count: 0.6 K/uL — CL (ref 4.0–10.5)
nRBC: 0 % (ref 0.0–0.2)

## 2023-09-03 LAB — CMP (CANCER CENTER ONLY)
ALT: 14 U/L (ref 0–44)
AST: 11 U/L — ABNORMAL LOW (ref 15–41)
Albumin: 4.3 g/dL (ref 3.5–5.0)
Alkaline Phosphatase: 43 U/L (ref 38–126)
Anion gap: 5 (ref 5–15)
BUN: 23 mg/dL (ref 8–23)
CO2: 27 mmol/L (ref 22–32)
Calcium: 9.9 mg/dL (ref 8.9–10.3)
Chloride: 107 mmol/L (ref 98–111)
Creatinine: 1.35 mg/dL — ABNORMAL HIGH (ref 0.61–1.24)
GFR, Estimated: 53 mL/min — ABNORMAL LOW (ref >60)
Glucose, Bld: 156 mg/dL — ABNORMAL HIGH (ref 70–99)
Potassium: 4.8 mmol/L (ref 3.5–5.1)
Sodium: 139 mmol/L (ref 135–145)
Total Bilirubin: 0.6 mg/dL (ref 0.0–1.2)
Total Protein: 6.2 g/dL — ABNORMAL LOW (ref 6.5–8.1)

## 2023-09-03 LAB — SAMPLE TO BLOOD BANK

## 2023-09-03 NOTE — Telephone Encounter (Signed)
 Received call from [facility] in [facility] ER lab with critical results. WBC 0.6, Platelets 43, ANC 0.2. Hemoglobin is 8.8. Results given to Lauraine Dais, PA, in Dr. Jessy absence.

## 2023-09-07 ENCOUNTER — Inpatient Hospital Stay

## 2023-09-07 ENCOUNTER — Telehealth: Payer: Self-pay

## 2023-09-07 DIAGNOSIS — D4621 Refractory anemia with excess of blasts 1: Secondary | ICD-10-CM

## 2023-09-07 DIAGNOSIS — D46Z Other myelodysplastic syndromes: Secondary | ICD-10-CM

## 2023-09-07 DIAGNOSIS — Z79899 Other long term (current) drug therapy: Secondary | ICD-10-CM | POA: Diagnosis not present

## 2023-09-07 DIAGNOSIS — Z5111 Encounter for antineoplastic chemotherapy: Secondary | ICD-10-CM | POA: Diagnosis not present

## 2023-09-07 LAB — CMP (CANCER CENTER ONLY)
ALT: 8 U/L (ref 0–44)
AST: 9 U/L — ABNORMAL LOW (ref 15–41)
Albumin: 4.1 g/dL (ref 3.5–5.0)
Alkaline Phosphatase: 47 U/L (ref 38–126)
Anion gap: 6 (ref 5–15)
BUN: 22 mg/dL (ref 8–23)
CO2: 27 mmol/L (ref 22–32)
Calcium: 9.7 mg/dL (ref 8.9–10.3)
Chloride: 105 mmol/L (ref 98–111)
Creatinine: 1.33 mg/dL — ABNORMAL HIGH (ref 0.61–1.24)
GFR, Estimated: 54 mL/min — ABNORMAL LOW (ref >60)
Glucose, Bld: 154 mg/dL — ABNORMAL HIGH (ref 70–99)
Potassium: 4.2 mmol/L (ref 3.5–5.1)
Sodium: 138 mmol/L (ref 135–145)
Total Bilirubin: 0.8 mg/dL (ref 0.0–1.2)
Total Protein: 6.1 g/dL — ABNORMAL LOW (ref 6.5–8.1)

## 2023-09-07 NOTE — Telephone Encounter (Signed)
 Call received from [facility] ED Lab ([facility]) reporting critical lab value of WBC 0.7. No new orders received at this time.

## 2023-09-08 ENCOUNTER — Encounter: Payer: Self-pay | Admitting: Hematology & Oncology

## 2023-09-08 ENCOUNTER — Other Ambulatory Visit: Payer: Self-pay | Admitting: Internal Medicine

## 2023-09-08 LAB — CBC WITH DIFFERENTIAL (CANCER CENTER ONLY)
Abs Immature Granulocytes: 0.08 K/uL — ABNORMAL HIGH (ref 0.00–0.07)
Basophils Absolute: 0 K/uL (ref 0.0–0.1)
Basophils Relative: 0 %
Eosinophils Absolute: 0 K/uL (ref 0.0–0.5)
Eosinophils Relative: 1 %
HCT: 23.5 % — ABNORMAL LOW (ref 39.0–52.0)
Hemoglobin: 8.2 g/dL — ABNORMAL LOW (ref 13.0–17.0)
Immature Granulocytes: 11 %
Lymphocytes Relative: 23 %
Lymphs Abs: 0.2 K/uL — ABNORMAL LOW (ref 0.7–4.0)
MCH: 35.3 pg — ABNORMAL HIGH (ref 26.0–34.0)
MCHC: 34.9 g/dL (ref 30.0–36.0)
MCV: 101.3 fL — ABNORMAL HIGH (ref 80.0–100.0)
Monocytes Absolute: 0 K/uL — ABNORMAL LOW (ref 0.1–1.0)
Monocytes Relative: 4 %
Neutro Abs: 0.4 K/uL — CL (ref 1.7–7.7)
Neutrophils Relative %: 61 %
Platelet Count: 35 K/uL — ABNORMAL LOW (ref 150–400)
RBC: 2.32 MIL/uL — ABNORMAL LOW (ref 4.22–5.81)
RDW: 18.4 % — ABNORMAL HIGH (ref 11.5–15.5)
WBC Count: 0.7 K/uL — CL (ref 4.0–10.5)
nRBC: 0 % (ref 0.0–0.2)

## 2023-09-08 NOTE — Progress Notes (Signed)
 Rx

## 2023-09-10 ENCOUNTER — Ambulatory Visit (INDEPENDENT_AMBULATORY_CARE_PROVIDER_SITE_OTHER): Payer: Self-pay | Admitting: Urology

## 2023-09-10 ENCOUNTER — Inpatient Hospital Stay

## 2023-09-10 ENCOUNTER — Telehealth: Payer: Self-pay

## 2023-09-10 VITALS — BP 144/75 | HR 65 | Ht 72.0 in | Wt 170.0 lb

## 2023-09-10 DIAGNOSIS — N138 Other obstructive and reflux uropathy: Secondary | ICD-10-CM

## 2023-09-10 DIAGNOSIS — N529 Male erectile dysfunction, unspecified: Secondary | ICD-10-CM

## 2023-09-10 DIAGNOSIS — D46Z Other myelodysplastic syndromes: Secondary | ICD-10-CM

## 2023-09-10 DIAGNOSIS — Z5111 Encounter for antineoplastic chemotherapy: Secondary | ICD-10-CM | POA: Diagnosis not present

## 2023-09-10 DIAGNOSIS — N401 Enlarged prostate with lower urinary tract symptoms: Secondary | ICD-10-CM

## 2023-09-10 DIAGNOSIS — D4621 Refractory anemia with excess of blasts 1: Secondary | ICD-10-CM

## 2023-09-10 DIAGNOSIS — Z79899 Other long term (current) drug therapy: Secondary | ICD-10-CM | POA: Diagnosis not present

## 2023-09-10 LAB — CMP (CANCER CENTER ONLY)
ALT: 11 U/L (ref 0–44)
AST: 10 U/L — ABNORMAL LOW (ref 15–41)
Albumin: 4.1 g/dL (ref 3.5–5.0)
Alkaline Phosphatase: 48 U/L (ref 38–126)
Anion gap: 5 (ref 5–15)
BUN: 16 mg/dL (ref 8–23)
CO2: 28 mmol/L (ref 22–32)
Calcium: 9.9 mg/dL (ref 8.9–10.3)
Chloride: 108 mmol/L (ref 98–111)
Creatinine: 1.13 mg/dL (ref 0.61–1.24)
GFR, Estimated: >60 mL/min (ref >60)
Glucose, Bld: 157 mg/dL — ABNORMAL HIGH (ref 70–99)
Potassium: 4.7 mmol/L (ref 3.5–5.1)
Sodium: 141 mmol/L (ref 135–145)
Total Bilirubin: 0.5 mg/dL (ref 0.0–1.2)
Total Protein: 6 g/dL — ABNORMAL LOW (ref 6.5–8.1)

## 2023-09-10 LAB — CBC WITH DIFFERENTIAL (CANCER CENTER ONLY)
Abs Immature Granulocytes: 0 K/uL (ref 0.00–0.07)
Basophils Absolute: 0 K/uL (ref 0.0–0.1)
Basophils Relative: 0 %
Eosinophils Absolute: 0 K/uL (ref 0.0–0.5)
Eosinophils Relative: 8 %
HCT: 22.9 % — ABNORMAL LOW (ref 39.0–52.0)
Hemoglobin: 8 g/dL — ABNORMAL LOW (ref 13.0–17.0)
Immature Granulocytes: 0 %
Lymphocytes Relative: 39 %
Lymphs Abs: 0.2 K/uL — ABNORMAL LOW (ref 0.7–4.0)
MCH: 36 pg — ABNORMAL HIGH (ref 26.0–34.0)
MCHC: 34.9 g/dL (ref 30.0–36.0)
MCV: 103.2 fL — ABNORMAL HIGH (ref 80.0–100.0)
Monocytes Absolute: 0 K/uL — ABNORMAL LOW (ref 0.1–1.0)
Monocytes Relative: 8 %
Neutro Abs: 0.2 K/uL — CL (ref 1.7–7.7)
Neutrophils Relative %: 45 %
Platelet Count: 41 K/uL — ABNORMAL LOW (ref 150–400)
RBC: 2.22 MIL/uL — ABNORMAL LOW (ref 4.22–5.81)
RDW: 19 % — ABNORMAL HIGH (ref 11.5–15.5)
WBC Count: 0.5 K/uL — CL (ref 4.0–10.5)
nRBC: 0 % (ref 0.0–0.2)

## 2023-09-10 LAB — SAMPLE TO BLOOD BANK

## 2023-09-10 LAB — BLADDER SCAN AMB NON-IMAGING

## 2023-09-10 MED ORDER — TADALAFIL 5 MG PO TABS: 5.0000 mg | ORAL_TABLET | Freq: Every day | ORAL | 3 refills | Status: AC

## 2023-09-10 NOTE — Progress Notes (Signed)
   09/10/2023 2:08 PM   Patrick [name] 11/06/1940 [id number]  Reason for visit: Follow up BPH, ED  HPI: He is an 83 year old male with mechanical valve on Coumadin  who underwent a PAE in February 2020 at [facility](Dr. Wadell) for a 120 g prostate.  He did very well after this procedure and strength of urinary stream has improved significantly.   PVR today is normal at 77 mL.  He takes Cialis  5 mg daily which he feels improves the strength of the urinary stream.  He has erectile dysfunction as well but has not seen a significant improvement on the Cialis , even when increasing the dose to 20 mg.  He is not interested in other treatments for ED at this point.  Cialis  refilled RTC 1 year PVR   [name], MD  [facility] [address], [address] [place], [place] [address] [phone]

## 2023-09-10 NOTE — Telephone Encounter (Signed)
 Critical WBC of 0.5, ANC of .23 and HGB 8.0 received from lab, MD notified

## 2023-09-11 ENCOUNTER — Other Ambulatory Visit: Payer: Self-pay

## 2023-09-14 ENCOUNTER — Telehealth: Payer: Self-pay

## 2023-09-14 ENCOUNTER — Inpatient Hospital Stay

## 2023-09-14 DIAGNOSIS — Z5111 Encounter for antineoplastic chemotherapy: Secondary | ICD-10-CM | POA: Diagnosis not present

## 2023-09-14 DIAGNOSIS — D46Z Other myelodysplastic syndromes: Secondary | ICD-10-CM

## 2023-09-14 DIAGNOSIS — D4621 Refractory anemia with excess of blasts 1: Secondary | ICD-10-CM

## 2023-09-14 DIAGNOSIS — Z79899 Other long term (current) drug therapy: Secondary | ICD-10-CM | POA: Diagnosis not present

## 2023-09-14 LAB — CBC WITH DIFFERENTIAL (CANCER CENTER ONLY)
Abs Immature Granulocytes: 0 K/uL (ref 0.00–0.07)
Basophils Absolute: 0 K/uL (ref 0.0–0.1)
Basophils Relative: 0 %
Eosinophils Absolute: 0 K/uL (ref 0.0–0.5)
Eosinophils Relative: 2 %
HCT: 25.6 % — ABNORMAL LOW (ref 39.0–52.0)
Hemoglobin: 8.8 g/dL — ABNORMAL LOW (ref 13.0–17.0)
Immature Granulocytes: 0 %
Lymphocytes Relative: 47 %
Lymphs Abs: 0.2 K/uL — ABNORMAL LOW (ref 0.7–4.0)
MCH: 36.2 pg — ABNORMAL HIGH (ref 26.0–34.0)
MCHC: 34.4 g/dL (ref 30.0–36.0)
MCV: 105.3 fL — ABNORMAL HIGH (ref 80.0–100.0)
Monocytes Absolute: 0 K/uL — ABNORMAL LOW (ref 0.1–1.0)
Monocytes Relative: 9 %
Neutro Abs: 0.2 K/uL — CL (ref 1.7–7.7)
Neutrophils Relative %: 42 %
Platelet Count: 62 K/uL — ABNORMAL LOW (ref 150–400)
RBC: 2.43 MIL/uL — ABNORMAL LOW (ref 4.22–5.81)
RDW: 19.4 % — ABNORMAL HIGH (ref 11.5–15.5)
Smear Review: NORMAL
WBC Count: 0.4 K/uL — CL (ref 4.0–10.5)
nRBC: 0 % (ref 0.0–0.2)

## 2023-09-14 LAB — CMP (CANCER CENTER ONLY)
ALT: 12 U/L (ref 0–44)
AST: 11 U/L — ABNORMAL LOW (ref 15–41)
Albumin: 4.4 g/dL (ref 3.5–5.0)
Alkaline Phosphatase: 50 U/L (ref 38–126)
Anion gap: 6 (ref 5–15)
BUN: 19 mg/dL (ref 8–23)
CO2: 27 mmol/L (ref 22–32)
Calcium: 10.2 mg/dL (ref 8.9–10.3)
Chloride: 106 mmol/L (ref 98–111)
Creatinine: 1.3 mg/dL — ABNORMAL HIGH (ref 0.61–1.24)
GFR, Estimated: 55 mL/min — ABNORMAL LOW (ref >60)
Glucose, Bld: 165 mg/dL — ABNORMAL HIGH (ref 70–99)
Potassium: 4.8 mmol/L (ref 3.5–5.1)
Sodium: 139 mmol/L (ref 135–145)
Total Bilirubin: 0.6 mg/dL (ref 0.0–1.2)
Total Protein: 6.2 g/dL — ABNORMAL LOW (ref 6.5–8.1)

## 2023-09-14 LAB — SAMPLE TO BLOOD BANK

## 2023-09-14 NOTE — Telephone Encounter (Signed)
 Critical lab results received from [facility] ED Lab [facility]). WBC 0.4 ANC 0.2 Hgb 8.8 Plt 62. MD notified. No new orders received.

## 2023-09-17 ENCOUNTER — Telehealth: Payer: Self-pay

## 2023-09-17 ENCOUNTER — Inpatient Hospital Stay

## 2023-09-17 DIAGNOSIS — D4621 Refractory anemia with excess of blasts 1: Secondary | ICD-10-CM | POA: Diagnosis not present

## 2023-09-17 DIAGNOSIS — D46Z Other myelodysplastic syndromes: Secondary | ICD-10-CM

## 2023-09-17 DIAGNOSIS — Z5111 Encounter for antineoplastic chemotherapy: Secondary | ICD-10-CM | POA: Diagnosis not present

## 2023-09-17 DIAGNOSIS — Z79899 Other long term (current) drug therapy: Secondary | ICD-10-CM | POA: Diagnosis not present

## 2023-09-17 LAB — CBC WITH DIFFERENTIAL (CANCER CENTER ONLY)
Abs Immature Granulocytes: 0.01 K/uL (ref 0.00–0.07)
Basophils Absolute: 0 K/uL (ref 0.0–0.1)
Basophils Relative: 0 %
Eosinophils Absolute: 0 K/uL (ref 0.0–0.5)
Eosinophils Relative: 6 %
HCT: 24.6 % — ABNORMAL LOW (ref 39.0–52.0)
Hemoglobin: 8.5 g/dL — ABNORMAL LOW (ref 13.0–17.0)
Immature Granulocytes: 2 %
Lymphocytes Relative: 41 %
Lymphs Abs: 0.2 K/uL — ABNORMAL LOW (ref 0.7–4.0)
MCH: 36.6 pg — ABNORMAL HIGH (ref 26.0–34.0)
MCHC: 34.6 g/dL (ref 30.0–36.0)
MCV: 106 fL — ABNORMAL HIGH (ref 80.0–100.0)
Monocytes Absolute: 0 K/uL — ABNORMAL LOW (ref 0.1–1.0)
Monocytes Relative: 8 %
Neutro Abs: 0.2 K/uL — CL (ref 1.7–7.7)
Neutrophils Relative %: 43 %
Platelet Count: 70 K/uL — ABNORMAL LOW (ref 150–400)
RBC: 2.32 MIL/uL — ABNORMAL LOW (ref 4.22–5.81)
RDW: 20 % — ABNORMAL HIGH (ref 11.5–15.5)
Smear Review: NORMAL
WBC Count: 0.5 K/uL — CL (ref 4.0–10.5)
nRBC: 0 % (ref 0.0–0.2)

## 2023-09-17 LAB — CMP (CANCER CENTER ONLY)
ALT: 14 U/L (ref 0–44)
AST: 14 U/L — ABNORMAL LOW (ref 15–41)
Albumin: 4.2 g/dL (ref 3.5–5.0)
Alkaline Phosphatase: 61 U/L (ref 38–126)
Anion gap: 8 (ref 5–15)
BUN: 15 mg/dL (ref 8–23)
CO2: 26 mmol/L (ref 22–32)
Calcium: 9.4 mg/dL (ref 8.9–10.3)
Chloride: 109 mmol/L (ref 98–111)
Creatinine: 1.18 mg/dL (ref 0.61–1.24)
GFR, Estimated: >60 mL/min (ref >60)
Glucose, Bld: 150 mg/dL — ABNORMAL HIGH (ref 70–99)
Potassium: 4.9 mmol/L (ref 3.5–5.1)
Sodium: 143 mmol/L (ref 135–145)
Total Bilirubin: 0.4 mg/dL (ref 0.0–1.2)
Total Protein: 5.9 g/dL — ABNORMAL LOW (ref 6.5–8.1)

## 2023-09-17 LAB — SAMPLE TO BLOOD BANK

## 2023-09-17 NOTE — Telephone Encounter (Signed)
 Critical lab results received by Olivia in ED lab. WBC is 0.6, platelets 70, ANC 0.2, Hgb 8.5. Dr Timmy made aware and does not suggest any changes at this time.

## 2023-09-21 ENCOUNTER — Inpatient Hospital Stay

## 2023-09-21 ENCOUNTER — Telehealth: Payer: Self-pay

## 2023-09-21 ENCOUNTER — Encounter: Payer: Self-pay | Admitting: Hematology & Oncology

## 2023-09-21 ENCOUNTER — Inpatient Hospital Stay (HOSPITAL_BASED_OUTPATIENT_CLINIC_OR_DEPARTMENT_OTHER): Admitting: Hematology & Oncology

## 2023-09-21 VITALS — BP 108/51 | HR 64 | Temp 98.2°F | Resp 18 | Ht 72.0 in | Wt 166.4 lb

## 2023-09-21 VITALS — BP 132/69 | HR 59 | Resp 18

## 2023-09-21 DIAGNOSIS — D46Z Other myelodysplastic syndromes: Secondary | ICD-10-CM

## 2023-09-21 DIAGNOSIS — D4621 Refractory anemia with excess of blasts 1: Secondary | ICD-10-CM

## 2023-09-21 DIAGNOSIS — Z79899 Other long term (current) drug therapy: Secondary | ICD-10-CM | POA: Diagnosis not present

## 2023-09-21 DIAGNOSIS — Z5111 Encounter for antineoplastic chemotherapy: Secondary | ICD-10-CM | POA: Diagnosis not present

## 2023-09-21 LAB — CBC WITH DIFFERENTIAL (CANCER CENTER ONLY)
Abs Immature Granulocytes: 0 K/uL (ref 0.00–0.07)
Basophils Absolute: 0 K/uL (ref 0.0–0.1)
Basophils Relative: 2 %
Eosinophils Absolute: 0 K/uL (ref 0.0–0.5)
Eosinophils Relative: 8 %
HCT: 26.3 % — ABNORMAL LOW (ref 39.0–52.0)
Hemoglobin: 9.1 g/dL — ABNORMAL LOW (ref 13.0–17.0)
Immature Granulocytes: 0 %
Lymphocytes Relative: 55 %
Lymphs Abs: 0.3 K/uL — ABNORMAL LOW (ref 0.7–4.0)
MCH: 36.7 pg — ABNORMAL HIGH (ref 26.0–34.0)
MCHC: 34.6 g/dL (ref 30.0–36.0)
MCV: 106 fL — ABNORMAL HIGH (ref 80.0–100.0)
Monocytes Absolute: 0 K/uL — ABNORMAL LOW (ref 0.1–1.0)
Monocytes Relative: 6 %
Neutro Abs: 0.2 K/uL — CL (ref 1.7–7.7)
Neutrophils Relative %: 29 %
Platelet Count: 77 K/uL — ABNORMAL LOW (ref 150–400)
RBC: 2.48 MIL/uL — ABNORMAL LOW (ref 4.22–5.81)
RDW: 19.5 % — ABNORMAL HIGH (ref 11.5–15.5)
WBC Count: 0.5 K/uL — CL (ref 4.0–10.5)
nRBC: 0 % (ref 0.0–0.2)

## 2023-09-21 LAB — CMP (CANCER CENTER ONLY)
ALT: 16 U/L (ref 0–44)
AST: 14 U/L — ABNORMAL LOW (ref 15–41)
Albumin: 4.2 g/dL (ref 3.5–5.0)
Alkaline Phosphatase: 59 U/L (ref 38–126)
Anion gap: 9 (ref 5–15)
BUN: 14 mg/dL (ref 8–23)
CO2: 26 mmol/L (ref 22–32)
Calcium: 9.7 mg/dL (ref 8.9–10.3)
Chloride: 104 mmol/L (ref 98–111)
Creatinine: 1.08 mg/dL (ref 0.61–1.24)
GFR, Estimated: >60 mL/min (ref >60)
Glucose, Bld: 149 mg/dL — ABNORMAL HIGH (ref 70–99)
Potassium: 4.5 mmol/L (ref 3.5–5.1)
Sodium: 139 mmol/L (ref 135–145)
Total Bilirubin: 0.5 mg/dL (ref 0.0–1.2)
Total Protein: 6.1 g/dL — ABNORMAL LOW (ref 6.5–8.1)

## 2023-09-21 LAB — FERRITIN: Ferritin: 526 ng/mL — ABNORMAL HIGH (ref 24–336)

## 2023-09-21 LAB — LACTATE DEHYDROGENASE: LDH: 220 U/L — ABNORMAL HIGH (ref 98–192)

## 2023-09-21 LAB — IRON AND IRON BINDING CAPACITY (CC-WL,HP ONLY)
Iron: 121 ug/dL (ref 45–182)
Saturation Ratios: 42 % — ABNORMAL HIGH (ref 17.9–39.5)
TIBC: 291 ug/dL (ref 250–450)
UIBC: 170 ug/dL

## 2023-09-21 LAB — SAVE SMEAR(SSMR), FOR PROVIDER SLIDE REVIEW

## 2023-09-21 MED ORDER — ONDANSETRON HCL 8 MG PO TABS: 8.0000 mg | ORAL_TABLET | Freq: Once | ORAL | Status: DC

## 2023-09-21 MED ORDER — SODIUM CHLORIDE 0.9 % IV SOLN
INTRAVENOUS | Status: DC
Start: 2023-09-21 — End: 2023-09-21

## 2023-09-21 MED ORDER — SODIUM CHLORIDE 0.9 % IV SOLN
75.0000 mg/m2 | Freq: Once | INTRAVENOUS | Status: AC
  Administered 2023-09-21: 148 mg via INTRAVENOUS
  Filled 2023-09-21: qty 14.8

## 2023-09-21 MED ORDER — CIPROFLOXACIN HCL 500 MG PO TABS: 500.0000 mg | ORAL_TABLET | Freq: Every day | ORAL | 2 refills | Status: DC

## 2023-09-21 NOTE — Telephone Encounter (Signed)
 Call received from [facility] ED Lab [facility]) with critical lab results of WBC 0.5 ANC 0.2. MD aware and no new orders received.

## 2023-09-21 NOTE — Progress Notes (Signed)
 OK to treat with ANC and platelets values from today per Dr. Myna Hidalgo.

## 2023-09-21 NOTE — Patient Instructions (Signed)
 [facility] - A DEPT OF [facility]  Discharge Instructions: Thank you for choosing [facility] to provide your oncology and hematology care.   If you have a lab appointment with the Cancer Center, please go directly to the Cancer Center and check in at the registration area.  Wear comfortable clothing and clothing appropriate for easy access to any Portacath or PICC line.   We strive to give you quality time with your provider. You may need to reschedule your appointment if you arrive late (15 or more minutes).  Arriving late affects you and other patients whose appointments are after yours.  Also, if you miss three or more appointments without notifying the office, you may be dismissed from the clinic at the provider's discretion.      For prescription refill requests, have your pharmacy contact our office and allow 72 hours for refills to be completed.    Today you received the following chemotherapy and/or immunotherapy agents:  Vidaza      To help prevent nausea and vomiting after your treatment, we encourage you to take your nausea medication as directed.  BELOW ARE SYMPTOMS THAT SHOULD BE REPORTED IMMEDIATELY: *FEVER GREATER THAN 100.4 F (38 C) OR HIGHER *CHILLS OR SWEATING *NAUSEA AND VOMITING THAT IS NOT CONTROLLED WITH YOUR NAUSEA MEDICATION *UNUSUAL SHORTNESS OF BREATH *UNUSUAL BRUISING OR BLEEDING *URINARY PROBLEMS (pain or burning when urinating, or frequent urination) *BOWEL PROBLEMS (unusual diarrhea, constipation, pain near the anus) TENDERNESS IN MOUTH AND THROAT WITH OR WITHOUT PRESENCE OF ULCERS (sore throat, sores in mouth, or a toothache) UNUSUAL RASH, SWELLING OR PAIN  UNUSUAL VAGINAL DISCHARGE OR ITCHING   Items with * indicate a potential emergency and should be followed up as soon as possible or go to the Emergency Department if any problems should occur.  Please show the CHEMOTHERAPY ALERT CARD or IMMUNOTHERAPY  ALERT CARD at check-in to the Emergency Department and triage nurse. Should you have questions after your visit or need to cancel or reschedule your appointment, please contact [facility] - A DEPT OF [facility]  [phone] and follow the prompts.  Office hours are 8:00 a.m. to 4:30 p.m. Monday - Friday. Please note that voicemails left after 4:00 p.m. may not be returned until the following business day.  We are closed weekends and major holidays. You have access to a nurse at all times for urgent questions. Please call the main number to the clinic [phone] and follow the prompts.  For any non-urgent questions, you may also contact your provider using MyChart. We now offer e-Visits for anyone 30 and older to request care online for non-urgent symptoms. For details visit [email or web address].   Also download the MyChart app! Go to the app store, search "MyChart", open the app, select [place], and log in with your MyChart username and password.

## 2023-09-21 NOTE — Progress Notes (Signed)
 9 Hematology and Oncology Follow Up Visit  Patrick [name] [id number] 06-21-1940 83 y.o. 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  So far, he is managing pretty well.  His platelet count is slowly coming up.  Hopefully, this might be an indicator that the treatment is working.  He has an appointment with [name] next week.  It sounds like they will do a bone marrow on him down there.  I was going to have him do a bone marrow.  After this for the cycle of treatment but we will certainly hold off on this.  It will be interesting to see what the doctors out at [name] have to say.  I told him that we would be more than happy to help out any way that we can.  If they have any other suggestions that we could consider we should be able to do this appear.  He is on Lovenox .  I told him that we might build to get him back onto Coumadin  if his platelet count continues to stabilize or improve.  He has had no problems with bleeding.  He has had some bruising.  His appetite is doing okay.  He has had no nausea or vomiting.  There has been no change in his bowels or bladder.  He has had no leg swelling.  He has had no cough or shortness of breath.  He has had no visual changes.  Overall, I would have to say that his performance status is probably ECOG 1.   Medications:  Current Outpatient Medications:    amLODipine  (NORVASC ) 5 MG tablet, TAKE ONE TABLET BY MOUTH ONE TIME DAILY, Disp: 90 tablet, Rfl: 3   atorvastatin  (LIPITOR) 10 MG tablet, TAKE ONE TABLET BY MOUTH ONE TIME DAILY, Disp: 90 tablet, Rfl: 2   ciprofloxacin  (CIPRO ) 500 MG tablet, TAKE ONE TABLET BY MOUTH TWICE A DAY, Disp: 20 tablet, Rfl: 2   diphenoxylate -atropine  (LOMOTIL )  2.5-0.025 MG tablet, Take 1 tablet by mouth 4 (four) times daily as needed for diarrhea or loose stools., Disp: 60 tablet, Rfl: 1   enoxaparin  (LOVENOX ) 120 MG/0.8ML injection, Inject 0.8 mLs (120 mg total) into the skin daily., Disp: 24 mL, Rfl: 12   famciclovir  (FAMVIR ) 250 MG tablet, Take 1 tablet (250 mg total) by mouth daily., Disp: 30 tablet, Rfl: 12   hydrocortisone 2.5 % cream, Apply 1 Application topically daily as needed., Disp: , Rfl:    itraconazole  (SPORANOX ) 100 MG capsule, Take 1 capsule (100 mg total) by mouth daily., Disp: 30 capsule, Rfl: 6   pantoprazole  (PROTONIX ) 40 MG tablet, TAKE ONE TABLET BY MOUTH ONE TIME DAILY, Disp: 90 tablet, Rfl: 3   SUMAtriptan  (IMITREX ) 25 MG tablet, TAKE ONE TABLET BY MOUTH AT ONSET OF HEADACHE. MAY REPEAT DOSE WITH ONE TABLET IN TWO HOURS IF NEEDED. DO NOT EXCEED TWO TABLETS IN 24 HOURS., Disp: 12 tablet, Rfl: 0   tadalafil  (CIALIS ) 5 MG tablet, Take 1 tablet (5 mg total) by mouth daily., Disp: 90 tablet, Rfl: 3  Allergies:  Allergies  Allergen Reactions   Codeine  Rash   Terbinafine Itching    Past Medical History, Surgical history, Social history, and Family History were reviewed  and updated.  Review of Systems: Review of Systems  Constitutional: Negative.   HENT:  Negative.    Eyes: Negative.   Respiratory: Negative.    Cardiovascular: Negative.   Gastrointestinal: Negative.   Endocrine: Negative.   Genitourinary: Negative.    Musculoskeletal: Negative.   Skin: Negative.   Neurological: Negative.   Hematological: Negative.   Psychiatric/Behavioral: Negative.      Physical Exam:  height is 6' (1.829 m) and weight is 166 lb 6.4 oz (75.5 kg). His oral temperature is 98.2 F (36.8 C). His blood pressure is 108/51 (abnormal) and his pulse is 64. His respiration is 18 and oxygen saturation is 100%.   Wt Readings from Last 3 Encounters:  09/21/23 166 lb 6.4 oz (75.5 kg)  09/10/23 170 lb (77.1 kg)  08/24/23 168 lb (76.2 kg)     Physical Exam Vitals reviewed.  HENT:     Head: Normocephalic and atraumatic.  Eyes:     Pupils: Pupils are equal, round, and reactive to light.     Comments: Ocular exam does show the conjunctival hemorrhage with the right eye.  Pupil reacts appropriately.  He has good extraocular muscle movement.  Cardiovascular:     Rate and Rhythm: Normal rate and regular rhythm.     Heart sounds: Normal heart sounds.     Comments: Cardiac exam shows a regular rate and rhythm.  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  I hear no murmurs or rubs or bruits. Pulmonary:     Effort: Pulmonary effort is normal.     Breath sounds: Normal breath sounds.  Abdominal:     General: Bowel sounds are normal.     Palpations: Abdomen is soft.  Musculoskeletal:        General: No tenderness or deformity. Normal range of motion.     Cervical back: Normal range of motion.  Lymphadenopathy:     Cervical: No cervical adenopathy.  Skin:    General: Skin is warm and dry.     Findings: No erythema or rash.  Neurological:     Mental Status: He is alert and oriented to person, place, and time.  Psychiatric:        Behavior: Behavior normal.        Thought Content: Thought content normal.        Judgment: Judgment normal.      Lab Results  Component Value Date   WBC 0.5 (LL) 09/21/2023   HGB 9.1 (L) 09/21/2023   HCT 26.3 (L) 09/21/2023   MCV 106.0 (H) 09/21/2023   PLT 77 (L) 09/21/2023     Chemistry      Component Value Date/Time   NA 139 09/21/2023 1002   NA 139 11/12/2020 1030   K 4.5 09/21/2023 1002   CL 104 09/21/2023 1002   CO2 26 09/21/2023 1002   BUN 14 09/21/2023 1002   BUN 16 11/12/2020 1030   CREATININE 1.08 09/21/2023 1002      Component Value Date/Time   CALCIUM  9.7 09/21/2023 1002   ALKPHOS 59 09/21/2023 1002   AST 14 (L) 09/21/2023 1002   ALT 16 09/21/2023 1002   BILITOT 0.5 09/21/2023 1002       Impression and Plan: Patrick [name] is a 83 year old  white male.  He has mild leukopenia and thrombocytopenia. I did do a bone marrow biopsy on him.  This confirmed that he has myelodysplasia.  Again, I would classify him as refractory anemia with excess blast-1  (  RAEB-1).  We will go ahead with his fourth cycle of treatment.  He will then go down to [name] to see what they would have to say.  Again, I think this would certainly be a reasonable approach.  Again I am happy that his platelet count is going up.  Again this might be an indicator that his marrow is starting to function a little bit better.  I realize that he still has the leukopenia.  He is on broad-spectrum coverage for fungal, viral and bacterial infections.  We will recheck his labs on Thursday.  I think that he is down to [facility], [place]  next Tuesday.  Sound like he will be down there for about 3 days.  I will look forward to hearing from [name] as to what they would recommend, if anything different.  They may have a special protocol that he might qualify for.  Again I think Mr. Penkala has done pretty well.  I will plan to see him back myself in another month.  Maude JONELLE Crease, Patrick 7/28/202511:06 AM

## 2023-09-22 ENCOUNTER — Inpatient Hospital Stay

## 2023-09-22 VITALS — BP 137/57 | HR 63 | Temp 97.6°F | Resp 18

## 2023-09-22 DIAGNOSIS — Z5111 Encounter for antineoplastic chemotherapy: Secondary | ICD-10-CM | POA: Diagnosis not present

## 2023-09-22 DIAGNOSIS — D46Z Other myelodysplastic syndromes: Secondary | ICD-10-CM

## 2023-09-22 DIAGNOSIS — D4621 Refractory anemia with excess of blasts 1: Secondary | ICD-10-CM

## 2023-09-22 DIAGNOSIS — Z79899 Other long term (current) drug therapy: Secondary | ICD-10-CM | POA: Diagnosis not present

## 2023-09-22 MED ORDER — SODIUM CHLORIDE 0.9 % IV SOLN
INTRAVENOUS | Status: DC
Start: 2023-09-22 — End: 2023-09-22

## 2023-09-22 MED ORDER — ONDANSETRON HCL 8 MG PO TABS: 8.0000 mg | ORAL_TABLET | Freq: Once | ORAL | Status: DC

## 2023-09-22 MED ORDER — SODIUM CHLORIDE 0.9 % IV SOLN
75.0000 mg/m2 | Freq: Once | INTRAVENOUS | Status: AC
  Administered 2023-09-22: 148 mg via INTRAVENOUS
  Filled 2023-09-22: qty 14.8

## 2023-09-22 NOTE — Patient Instructions (Signed)
 [facility] - A DEPT OF [facility]  Discharge Instructions: Thank you for choosing [facility] to provide your oncology and hematology care.   If you have a lab appointment with the Cancer Center, please go directly to the Cancer Center and check in at the registration area.  Wear comfortable clothing and clothing appropriate for easy access to any Portacath or PICC line.   We strive to give you quality time with your provider. You may need to reschedule your appointment if you arrive late (15 or more minutes).  Arriving late affects you and other patients whose appointments are after yours.  Also, if you miss three or more appointments without notifying the office, you may be dismissed from the clinic at the provider's discretion.      For prescription refill requests, have your pharmacy contact our office and allow 72 hours for refills to be completed.    Today you received the following chemotherapy and/or immunotherapy agents:  Vidaza      To help prevent nausea and vomiting after your treatment, we encourage you to take your nausea medication as directed.  BELOW ARE SYMPTOMS THAT SHOULD BE REPORTED IMMEDIATELY: *FEVER GREATER THAN 100.4 F (38 C) OR HIGHER *CHILLS OR SWEATING *NAUSEA AND VOMITING THAT IS NOT CONTROLLED WITH YOUR NAUSEA MEDICATION *UNUSUAL SHORTNESS OF BREATH *UNUSUAL BRUISING OR BLEEDING *URINARY PROBLEMS (pain or burning when urinating, or frequent urination) *BOWEL PROBLEMS (unusual diarrhea, constipation, pain near the anus) TENDERNESS IN MOUTH AND THROAT WITH OR WITHOUT PRESENCE OF ULCERS (sore throat, sores in mouth, or a toothache) UNUSUAL RASH, SWELLING OR PAIN  UNUSUAL VAGINAL DISCHARGE OR ITCHING   Items with * indicate a potential emergency and should be followed up as soon as possible or go to the Emergency Department if any problems should occur.  Please show the CHEMOTHERAPY ALERT CARD or IMMUNOTHERAPY  ALERT CARD at check-in to the Emergency Department and triage nurse. Should you have questions after your visit or need to cancel or reschedule your appointment, please contact [facility] - A DEPT OF [facility]  [phone] and follow the prompts.  Office hours are 8:00 a.m. to 4:30 p.m. Monday - Friday. Please note that voicemails left after 4:00 p.m. may not be returned until the following business day.  We are closed weekends and major holidays. You have access to a nurse at all times for urgent questions. Please call the main number to the clinic [phone] and follow the prompts.  For any non-urgent questions, you may also contact your provider using MyChart. We now offer e-Visits for anyone 30 and older to request care online for non-urgent symptoms. For details visit [email or web address].   Also download the MyChart app! Go to the app store, search "MyChart", open the app, select [place], and log in with your MyChart username and password.

## 2023-09-23 ENCOUNTER — Inpatient Hospital Stay

## 2023-09-23 VITALS — BP 103/56 | HR 67 | Temp 98.4°F | Resp 17

## 2023-09-23 DIAGNOSIS — D4621 Refractory anemia with excess of blasts 1: Secondary | ICD-10-CM | POA: Diagnosis not present

## 2023-09-23 DIAGNOSIS — Z5111 Encounter for antineoplastic chemotherapy: Secondary | ICD-10-CM | POA: Diagnosis not present

## 2023-09-23 DIAGNOSIS — D46Z Other myelodysplastic syndromes: Secondary | ICD-10-CM

## 2023-09-23 DIAGNOSIS — Z79899 Other long term (current) drug therapy: Secondary | ICD-10-CM | POA: Diagnosis not present

## 2023-09-23 MED ORDER — SODIUM CHLORIDE 0.9 % IV SOLN
75.0000 mg/m2 | Freq: Once | INTRAVENOUS | Status: AC
  Administered 2023-09-23: 148 mg via INTRAVENOUS
  Filled 2023-09-23: qty 14.8

## 2023-09-23 MED ORDER — SODIUM CHLORIDE 0.9 % IV SOLN: INTRAVENOUS | Status: DC

## 2023-09-23 MED ORDER — ONDANSETRON HCL 8 MG PO TABS: 8.0000 mg | ORAL_TABLET | Freq: Once | ORAL | Status: DC

## 2023-09-23 NOTE — Patient Instructions (Signed)
 Azacitidine Injection What is this medication? AZACITIDINE (ay za SITE i deen) treats blood and bone marrow cancers. It works by slowing down the growth of cancer cells. This medicine may be used for other purposes; ask your health care provider or pharmacist if you have questions. COMMON BRAND NAME(S): Vidaza What should I tell my care team before I take this medication? They need to know if you have any of these conditions: Kidney disease Liver disease Low blood cell levels, such as low white cells, platelets, or red blood cells Low levels of albumin in the blood Low levels of bicarbonate in the blood An unusual or allergic reaction to azacitidine, mannitol, other medications, foods, dyes, or preservatives If you or your partner are pregnant or trying to get pregnant Breast-feeding How should I use this medication? This medication is injected into a vein or under the skin. It is given by your care team in a hospital or clinic setting. Talk to your care team about the use of this medication in children. While it may be prescribed for children as young as 1 month for selected conditions, precautions do apply. Overdosage: If you think you have taken too much of this medicine contact a poison control center or emergency room at once. NOTE: This medicine is only for you. Do not share this medicine with others. What if I miss a dose? Keep appointments for follow-up doses. It is important not to miss your dose. Call your care team if you are unable to keep an appointment. What may interact with this medication? Interactions are not expected. This list may not describe all possible interactions. Give your health care provider a list of all the medicines, herbs, non-prescription drugs, or dietary supplements you use. Also tell them if you smoke, drink alcohol, or use illegal drugs. Some items may interact with your medicine. What should I watch for while using this medication? Your condition will  be monitored carefully while you are receiving this medication. This medication may make you feel generally unwell. This is not uncommon as chemotherapy can affect healthy cells as well as cancer cells. Report any side effects. Continue your course of treatment even though you feel ill unless your care team tells you to stop. You may need blood work done while you are taking this medication. Other product types may be available that contain the medication azacitidine. The injection and oral products should not be used in place of one another. Talk to your care team if you have questions. This medication can cause serious side effects. To reduce the risk, your care team may give you other medications to take before receiving this one. Be sure to follow the directions from your care team. This medication may increase your risk of getting an infection. Call your care team for advice if you get a fever, chills, sore throat, or other symptoms of a cold or flu. Do not treat yourself. Try to avoid being around people who are sick. Avoid taking medications that contain aspirin, acetaminophen, ibuprofen, naproxen, or ketoprofen unless instructed by your care team. These medications may hide a fever. Be careful brushing or flossing your teeth or using a toothpick because you may get an infection or bleed more easily. If you have any dental work done, tell your dentist you are receiving this medication. Talk to your care team if you or your partner may be pregnant. Serious birth defects can occur if you take this medication during pregnancy and for 6 months after the  last dose. You will need a negative pregnancy test before starting this medication. Contraception is recommended while taking this medication and for 6 months after the last dose. Your care team can help you find the option that works for you. If your partner can get pregnant, use a condom during sex while taking this medication and for 3 months after the  last dose. Do not breastfeed while taking this medication and for 1 week after the last dose. This medication may cause infertility. Talk to your care team if you are concerned about your fertility. What side effects may I notice from receiving this medication? Side effects that you should report to your care team as soon as possible: Allergic reactions--skin rash, itching, hives, swelling of the face, lips, tongue, or throat Infection--fever, chills, cough, sore throat, wounds that don't heal, pain or trouble when passing urine, general feeling of discomfort or being unwell Kidney injury--decrease in the amount of urine, swelling of the ankles, hands, or feet Liver injury--right upper belly pain, loss of appetite, nausea, light-colored stool, dark yellow or brown urine, yellowing skin or eyes, unusual weakness or fatigue Low red blood cell level--unusual weakness or fatigue, dizziness, headache, trouble breathing Tumor lysis syndrome (TLS)--nausea, vomiting, diarrhea, decrease in the amount of urine, dark urine, unusual weakness or fatigue, confusion, muscle pain or cramps, fast or irregular heartbeat, joint pain Unusual bruising or bleeding Side effects that usually do not require medical attention (report to your care team if they continue or are bothersome): Constipation Diarrhea Nausea Pain, redness, or irritation at injection site Vomiting This list may not describe all possible side effects. Call your doctor for medical advice about side effects. You may report side effects to FDA at 1-800-FDA-1088. Where should I keep my medication? This medication is given in a hospital or clinic. It will not be stored at home. NOTE: This sheet is a summary. It may not cover all possible information. If you have questions about this medicine, talk to your doctor, pharmacist, or health care provider.  2024 Elsevier/Gold Standard (2022-10-13 00:00:00)

## 2023-09-23 NOTE — Progress Notes (Signed)
 Patient stated about two hours after he got home from treatment yesterday he experienced severe abdominal pain and broke out in a cold sweat.  He never had a bowel movement.  Spoke with Lauraine Dais this am who said okay to proceed with treatment but recommends starting Miralax.  Patient does state he has been constipated prior to this episode and will pick some up at the pharmacy today.

## 2023-09-24 ENCOUNTER — Inpatient Hospital Stay

## 2023-09-24 VITALS — BP 115/66 | HR 56 | Temp 97.7°F | Resp 17

## 2023-09-24 DIAGNOSIS — D4621 Refractory anemia with excess of blasts 1: Secondary | ICD-10-CM | POA: Diagnosis not present

## 2023-09-24 DIAGNOSIS — D46Z Other myelodysplastic syndromes: Secondary | ICD-10-CM

## 2023-09-24 DIAGNOSIS — Z5111 Encounter for antineoplastic chemotherapy: Secondary | ICD-10-CM | POA: Diagnosis not present

## 2023-09-24 DIAGNOSIS — Z79899 Other long term (current) drug therapy: Secondary | ICD-10-CM | POA: Diagnosis not present

## 2023-09-24 LAB — CBC WITH DIFFERENTIAL (CANCER CENTER ONLY)
Abs Immature Granulocytes: 0.02 K/uL (ref 0.00–0.07)
Basophils Absolute: 0 K/uL (ref 0.0–0.1)
Basophils Relative: 2 %
Eosinophils Absolute: 0 K/uL (ref 0.0–0.5)
Eosinophils Relative: 2 %
HCT: 25.2 % — ABNORMAL LOW (ref 39.0–52.0)
Hemoglobin: 8.7 g/dL — ABNORMAL LOW (ref 13.0–17.0)
Immature Granulocytes: 4 %
Lymphocytes Relative: 56 %
Lymphs Abs: 0.3 K/uL — ABNORMAL LOW (ref 0.7–4.0)
MCH: 36.6 pg — ABNORMAL HIGH (ref 26.0–34.0)
MCHC: 34.5 g/dL (ref 30.0–36.0)
MCV: 105.9 fL — ABNORMAL HIGH (ref 80.0–100.0)
Monocytes Absolute: 0 K/uL — ABNORMAL LOW (ref 0.1–1.0)
Monocytes Relative: 6 %
Neutro Abs: 0.1 K/uL — CL (ref 1.7–7.7)
Neutrophils Relative %: 30 %
Platelet Count: 57 K/uL — ABNORMAL LOW (ref 150–400)
RBC: 2.38 MIL/uL — ABNORMAL LOW (ref 4.22–5.81)
RDW: 19.4 % — ABNORMAL HIGH (ref 11.5–15.5)
Smear Review: NORMAL
WBC Count: 0.5 K/uL — CL (ref 4.0–10.5)
nRBC: 0 % (ref 0.0–0.2)

## 2023-09-24 LAB — CMP (CANCER CENTER ONLY)
ALT: 14 U/L (ref 0–44)
AST: 14 U/L — ABNORMAL LOW (ref 15–41)
Albumin: 4 g/dL (ref 3.5–5.0)
Alkaline Phosphatase: 56 U/L (ref 38–126)
Anion gap: 9 (ref 5–15)
BUN: 17 mg/dL (ref 8–23)
CO2: 24 mmol/L (ref 22–32)
Calcium: 9.5 mg/dL (ref 8.9–10.3)
Chloride: 103 mmol/L (ref 98–111)
Creatinine: 1.18 mg/dL (ref 0.61–1.24)
GFR, Estimated: >60 mL/min (ref >60)
Glucose, Bld: 163 mg/dL — ABNORMAL HIGH (ref 70–99)
Potassium: 4.4 mmol/L (ref 3.5–5.1)
Sodium: 136 mmol/L (ref 135–145)
Total Bilirubin: 0.4 mg/dL (ref 0.0–1.2)
Total Protein: 5.8 g/dL — ABNORMAL LOW (ref 6.5–8.1)

## 2023-09-24 LAB — SAMPLE TO BLOOD BANK

## 2023-09-24 MED ORDER — ONDANSETRON HCL 8 MG PO TABS
8.0000 mg | ORAL_TABLET | Freq: Once | ORAL | Status: AC
  Administered 2023-09-24: 8 mg via ORAL
  Filled 2023-09-24: qty 1

## 2023-09-24 MED ORDER — SODIUM CHLORIDE 0.9 % IV SOLN
75.0000 mg/m2 | Freq: Once | INTRAVENOUS | Status: AC
  Administered 2023-09-24: 148 mg via INTRAVENOUS
  Filled 2023-09-24: qty 14.8

## 2023-09-24 MED ORDER — SODIUM CHLORIDE 0.9 % IV SOLN: INTRAVENOUS | Status: DC

## 2023-09-24 NOTE — Progress Notes (Signed)
 Critical results of WBC 0.5 and ANC 0.1 received from the lab. Dr. Timmy aware. No new orders.

## 2023-09-24 NOTE — Patient Instructions (Signed)
 [facility] - A DEPT OF [facility]  Discharge Instructions: Thank you for choosing [facility] to provide your oncology and hematology care.   If you have a lab appointment with the Cancer Center, please go directly to the Cancer Center and check in at the registration area.  Wear comfortable clothing and clothing appropriate for easy access to any Portacath or PICC line.   We strive to give you quality time with your provider. You may need to reschedule your appointment if you arrive late (15 or more minutes).  Arriving late affects you and other patients whose appointments are after yours.  Also, if you miss three or more appointments without notifying the office, you may be dismissed from the clinic at the provider's discretion.      For prescription refill requests, have your pharmacy contact our office and allow 72 hours for refills to be completed.    Today you received the following chemotherapy and/or immunotherapy agents:  Vidaza      To help prevent nausea and vomiting after your treatment, we encourage you to take your nausea medication as directed.  BELOW ARE SYMPTOMS THAT SHOULD BE REPORTED IMMEDIATELY: *FEVER GREATER THAN 100.4 F (38 C) OR HIGHER *CHILLS OR SWEATING *NAUSEA AND VOMITING THAT IS NOT CONTROLLED WITH YOUR NAUSEA MEDICATION *UNUSUAL SHORTNESS OF BREATH *UNUSUAL BRUISING OR BLEEDING *URINARY PROBLEMS (pain or burning when urinating, or frequent urination) *BOWEL PROBLEMS (unusual diarrhea, constipation, pain near the anus) TENDERNESS IN MOUTH AND THROAT WITH OR WITHOUT PRESENCE OF ULCERS (sore throat, sores in mouth, or a toothache) UNUSUAL RASH, SWELLING OR PAIN  UNUSUAL VAGINAL DISCHARGE OR ITCHING   Items with * indicate a potential emergency and should be followed up as soon as possible or go to the Emergency Department if any problems should occur.  Please show the CHEMOTHERAPY ALERT CARD or IMMUNOTHERAPY  ALERT CARD at check-in to the Emergency Department and triage nurse. Should you have questions after your visit or need to cancel or reschedule your appointment, please contact [facility] - A DEPT OF [facility]  [phone] and follow the prompts.  Office hours are 8:00 a.m. to 4:30 p.m. Monday - Friday. Please note that voicemails left after 4:00 p.m. may not be returned until the following business day.  We are closed weekends and major holidays. You have access to a nurse at all times for urgent questions. Please call the main number to the clinic [phone] and follow the prompts.  For any non-urgent questions, you may also contact your provider using MyChart. We now offer e-Visits for anyone 30 and older to request care online for non-urgent symptoms. For details visit [email or web address].   Also download the MyChart app! Go to the app store, search "MyChart", open the app, select [place], and log in with your MyChart username and password.

## 2023-09-25 ENCOUNTER — Inpatient Hospital Stay: Attending: Hematology & Oncology

## 2023-09-25 VITALS — BP 132/77 | HR 67 | Temp 98.0°F | Resp 17

## 2023-09-25 DIAGNOSIS — D4621 Refractory anemia with excess of blasts 1: Secondary | ICD-10-CM | POA: Diagnosis not present

## 2023-09-25 DIAGNOSIS — Z5111 Encounter for antineoplastic chemotherapy: Secondary | ICD-10-CM | POA: Diagnosis not present

## 2023-09-25 DIAGNOSIS — D46Z Other myelodysplastic syndromes: Secondary | ICD-10-CM

## 2023-09-25 DIAGNOSIS — Z79899 Other long term (current) drug therapy: Secondary | ICD-10-CM | POA: Diagnosis not present

## 2023-09-25 MED ORDER — SODIUM CHLORIDE 0.9 % IV SOLN: INTRAVENOUS | Status: DC

## 2023-09-25 MED ORDER — ONDANSETRON HCL 8 MG PO TABS: 8.0000 mg | ORAL_TABLET | Freq: Once | ORAL | Status: DC

## 2023-09-25 MED ORDER — SODIUM CHLORIDE 0.9 % IV SOLN
75.0000 mg/m2 | Freq: Once | INTRAVENOUS | Status: AC
  Administered 2023-09-25: 148 mg via INTRAVENOUS
  Filled 2023-09-25: qty 14.8

## 2023-09-28 ENCOUNTER — Other Ambulatory Visit: Payer: Self-pay | Admitting: Hematology & Oncology

## 2023-09-28 ENCOUNTER — Inpatient Hospital Stay

## 2023-09-28 ENCOUNTER — Other Ambulatory Visit

## 2023-09-28 VITALS — BP 118/68 | HR 62 | Temp 98.1°F | Resp 18

## 2023-09-28 DIAGNOSIS — D4621 Refractory anemia with excess of blasts 1: Secondary | ICD-10-CM

## 2023-09-28 DIAGNOSIS — Z79899 Other long term (current) drug therapy: Secondary | ICD-10-CM | POA: Diagnosis not present

## 2023-09-28 DIAGNOSIS — Z5111 Encounter for antineoplastic chemotherapy: Secondary | ICD-10-CM | POA: Diagnosis not present

## 2023-09-28 DIAGNOSIS — D46Z Other myelodysplastic syndromes: Secondary | ICD-10-CM

## 2023-09-28 LAB — CBC WITH DIFFERENTIAL (CANCER CENTER ONLY)
Basophils Absolute: 0 K/uL (ref 0.0–0.1)
Basophils Relative: 0 %
Eosinophils Absolute: 0 K/uL (ref 0.0–0.5)
Eosinophils Relative: 0 %
HCT: 24.5 % — ABNORMAL LOW (ref 39.0–52.0)
Hemoglobin: 8.5 g/dL — ABNORMAL LOW (ref 13.0–17.0)
Lymphocytes Relative: 70 %
Lymphs Abs: 0.3 K/uL — ABNORMAL LOW (ref 0.7–4.0)
MCH: 36 pg — ABNORMAL HIGH (ref 26.0–34.0)
MCHC: 34.7 g/dL (ref 30.0–36.0)
MCV: 103.8 fL — ABNORMAL HIGH (ref 80.0–100.0)
Monocytes Absolute: 0 K/uL — ABNORMAL LOW (ref 0.1–1.0)
Monocytes Relative: 5 %
Neutro Abs: 0.1 K/uL — CL (ref 1.7–7.7)
Neutrophils Relative %: 25 %
Platelet Count: 45 K/uL — ABNORMAL LOW (ref 150–400)
RBC: 2.36 MIL/uL — ABNORMAL LOW (ref 4.22–5.81)
RDW: 18.4 % — ABNORMAL HIGH (ref 11.5–15.5)
WBC Count: 0.4 K/uL — CL (ref 4.0–10.5)
nRBC: 0 % (ref 0.0–0.2)

## 2023-09-28 LAB — CMP (CANCER CENTER ONLY)
ALT: 11 U/L (ref 0–44)
AST: 13 U/L — ABNORMAL LOW (ref 15–41)
Albumin: 4.1 g/dL (ref 3.5–5.0)
Alkaline Phosphatase: 59 U/L (ref 38–126)
Anion gap: 10 (ref 5–15)
BUN: 16 mg/dL (ref 8–23)
CO2: 23 mmol/L (ref 22–32)
Calcium: 9.5 mg/dL (ref 8.9–10.3)
Chloride: 106 mmol/L (ref 98–111)
Creatinine: 1.09 mg/dL (ref 0.61–1.24)
GFR, Estimated: >60 mL/min (ref >60)
Glucose, Bld: 139 mg/dL — ABNORMAL HIGH (ref 70–99)
Potassium: 4.3 mmol/L (ref 3.5–5.1)
Sodium: 138 mmol/L (ref 135–145)
Total Bilirubin: 0.5 mg/dL (ref 0.0–1.2)
Total Protein: 6 g/dL — ABNORMAL LOW (ref 6.5–8.1)

## 2023-09-28 LAB — IRON AND IRON BINDING CAPACITY (CC-WL,HP ONLY)
Iron: 142 ug/dL (ref 45–182)
Saturation Ratios: 53 % — ABNORMAL HIGH (ref 17.9–39.5)
TIBC: 267 ug/dL (ref 250–450)
UIBC: 125 ug/dL

## 2023-09-28 LAB — FERRITIN: Ferritin: 622 ng/mL — ABNORMAL HIGH (ref 24–336)

## 2023-09-28 LAB — SAMPLE TO BLOOD BANK

## 2023-09-28 LAB — RETICULOCYTES
Immature Retic Fract: 8.1 % (ref 2.3–15.9)
RBC.: 2.34 MIL/uL — ABNORMAL LOW (ref 4.22–5.81)
Retic Count, Absolute: 20.1 K/uL (ref 19.0–186.0)
Retic Ct Pct: 0.9 % (ref 0.4–3.1)

## 2023-09-28 LAB — SAVE SMEAR(SSMR), FOR PROVIDER SLIDE REVIEW

## 2023-09-28 MED ORDER — SODIUM CHLORIDE 0.9 % IV SOLN
75.0000 mg/m2 | Freq: Once | INTRAVENOUS | Status: AC
  Administered 2023-09-28: 148 mg via INTRAVENOUS
  Filled 2023-09-28: qty 14.8

## 2023-09-28 MED ORDER — SODIUM CHLORIDE 0.9 % IV SOLN: INTRAVENOUS | Status: DC

## 2023-09-28 MED ORDER — ROPINIROLE HCL 0.25 MG PO TABS: 0.2500 mg | ORAL_TABLET | Freq: Three times a day (TID) | ORAL | 2 refills | Status: DC

## 2023-09-28 MED ORDER — ONDANSETRON HCL 8 MG PO TABS: 8.0000 mg | ORAL_TABLET | Freq: Once | ORAL | Status: DC

## 2023-09-28 NOTE — Progress Notes (Signed)
 Patient Navigation    Flowsheet Row Documentation [facility]  Visit Information   Purpose of Encounter Clinical Review  Encounter Conducted With Patient, Other (notes) * [medical records]  Patient Journey Phase Access/Intake  Clinical Review   Referring Diagnosis MDS  Date Pathology Performed 09/01/22  Initial Patient Intake   Barriers to Care and Social Determinants of Health Assessment   Time Navigated Today (Min) 20  Complexity of Care/Duration of Encounter Moderate: Greater than 11 minutes, less than 45 minutes  Total Time Navigated (Min) 90      ALERT: Pt taking Lovenox  120mg  SE daily- instructed to hold 24hrs before NP visit and day of  visit 7/31   Summary:  Principle Diagnosis:  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   Labs:(CE) 09/21/23: CBC/CMP  Pathology: 09/01/22: [facility] ([facility])   Imaging: 01/01/23: CT chest 11/24/22: EKG   Treatment: Current Therapy:  Vidaza  75 mg/m2 IV q day x 7 day -- s/p cycle #3 start on 06/29/2023 Lovenox  120 mg sq every day - for mech heart valve    *Some images could not be shown.

## 2023-09-28 NOTE — Progress Notes (Signed)
 Per Dr. Timmy, ok to treat today despite labs.

## 2023-09-28 NOTE — Patient Instructions (Signed)
 [facility]  Discharge Instructions: Thank you for choosing [facility] to provide your oncology and hematology care.   If you have a lab appointment with the Cancer Center, please go directly to the Cancer Center and check in at the registration area.  Wear comfortable clothing and clothing appropriate for easy access to any Portacath or PICC line.   We strive to give you quality time with your provider. You may need to reschedule your appointment if you arrive late (15 or more minutes).  Arriving late affects you and other patients whose appointments are after yours.  Also, if you miss three or more appointments without notifying the office, you may be dismissed from the clinic at the provider's discretion.      For prescription refill requests, have your pharmacy contact our office and allow 72 hours for refills to be completed.    Today you received the following chemotherapy and/or immunotherapy agents 5-AZA     To help prevent nausea and vomiting after your treatment, we encourage you to take your nausea medication as directed.  BELOW ARE SYMPTOMS THAT SHOULD BE REPORTED IMMEDIATELY: *FEVER GREATER THAN 100.4 F (38 C) OR HIGHER *CHILLS OR SWEATING *NAUSEA AND VOMITING THAT IS NOT CONTROLLED WITH YOUR NAUSEA MEDICATION *UNUSUAL SHORTNESS OF BREATH *UNUSUAL BRUISING OR BLEEDING *URINARY PROBLEMS (pain or burning when urinating, or frequent urination) *BOWEL PROBLEMS (unusual diarrhea, constipation, pain near the anus) TENDERNESS IN MOUTH AND THROAT WITH OR WITHOUT PRESENCE OF ULCERS (sore throat, sores in mouth, or a toothache) UNUSUAL RASH, SWELLING OR PAIN  UNUSUAL VAGINAL DISCHARGE OR ITCHING   Items with * indicate a potential emergency and should be followed up as soon as possible or go to the Emergency Department if any problems should occur.  Please show the CHEMOTHERAPY ALERT CARD or IMMUNOTHERAPY ALERT  CARD at check-in to the Emergency Department and triage nurse. Should you have questions after your visit or need to cancel or reschedule your appointment, please contact [facility] - A DEPT OF [facility]  [phone] and follow the prompts.  Office hours are 8:00 a.m. to 4:30 p.m. Monday - Friday. Please note that voicemails left after 4:00 p.m. may not be returned until the following business day.  We are closed weekends and major holidays. You have access to a nurse at all times for urgent questions. Please call the main number to the clinic [phone] and follow the prompts.  For any non-urgent questions, you may also contact your provider using MyChart. We now offer e-Visits for anyone 72 and older to request care online for non-urgent symptoms. For details visit [email or web address].   Also download the MyChart app! Go to the app store, search "MyChart", open the app, select [place], and log in with your MyChart username and password.

## 2023-09-29 ENCOUNTER — Inpatient Hospital Stay

## 2023-09-29 VITALS — BP 128/54 | HR 70 | Temp 97.6°F | Resp 19

## 2023-09-29 DIAGNOSIS — D4621 Refractory anemia with excess of blasts 1: Secondary | ICD-10-CM

## 2023-09-29 DIAGNOSIS — Z79899 Other long term (current) drug therapy: Secondary | ICD-10-CM | POA: Diagnosis not present

## 2023-09-29 DIAGNOSIS — D46Z Other myelodysplastic syndromes: Secondary | ICD-10-CM

## 2023-09-29 DIAGNOSIS — Z5111 Encounter for antineoplastic chemotherapy: Secondary | ICD-10-CM | POA: Diagnosis not present

## 2023-09-29 MED ORDER — SODIUM CHLORIDE 0.9 % IV SOLN: INTRAVENOUS | Status: DC

## 2023-09-29 MED ORDER — ONDANSETRON HCL 8 MG PO TABS: 8.0000 mg | ORAL_TABLET | Freq: Once | ORAL | Status: DC

## 2023-09-29 MED ORDER — SODIUM CHLORIDE 0.9 % IV SOLN
75.0000 mg/m2 | Freq: Once | INTRAVENOUS | Status: AC
  Administered 2023-09-29: 148 mg via INTRAVENOUS
  Filled 2023-09-29: qty 14.8

## 2023-09-30 DIAGNOSIS — Z792 Long term (current) use of antibiotics: Secondary | ICD-10-CM | POA: Diagnosis not present

## 2023-09-30 DIAGNOSIS — K219 Gastro-esophageal reflux disease without esophagitis: Secondary | ICD-10-CM | POA: Diagnosis not present

## 2023-09-30 DIAGNOSIS — Z85828 Personal history of other malignant neoplasm of skin: Secondary | ICD-10-CM | POA: Diagnosis not present

## 2023-09-30 DIAGNOSIS — I10 Essential (primary) hypertension: Secondary | ICD-10-CM | POA: Diagnosis not present

## 2023-09-30 DIAGNOSIS — I2510 Atherosclerotic heart disease of native coronary artery without angina pectoris: Secondary | ICD-10-CM | POA: Diagnosis not present

## 2023-09-30 DIAGNOSIS — D469 Myelodysplastic syndrome, unspecified: Secondary | ICD-10-CM | POA: Diagnosis not present

## 2023-09-30 DIAGNOSIS — Z7901 Long term (current) use of anticoagulants: Secondary | ICD-10-CM | POA: Diagnosis not present

## 2023-09-30 DIAGNOSIS — Z87891 Personal history of nicotine dependence: Secondary | ICD-10-CM | POA: Diagnosis not present

## 2023-09-30 DIAGNOSIS — Z79899 Other long term (current) drug therapy: Secondary | ICD-10-CM | POA: Diagnosis not present

## 2023-10-02 ENCOUNTER — Other Ambulatory Visit: Payer: Self-pay | Admitting: *Deleted

## 2023-10-02 ENCOUNTER — Telehealth: Payer: Self-pay | Admitting: *Deleted

## 2023-10-02 DIAGNOSIS — D46Z Other myelodysplastic syndromes: Secondary | ICD-10-CM

## 2023-10-02 DIAGNOSIS — D4621 Refractory anemia with excess of blasts 1: Secondary | ICD-10-CM

## 2023-10-02 DIAGNOSIS — D696 Thrombocytopenia, unspecified: Secondary | ICD-10-CM

## 2023-10-02 DIAGNOSIS — D709 Neutropenia, unspecified: Secondary | ICD-10-CM

## 2023-10-02 DIAGNOSIS — D72810 Lymphocytopenia: Secondary | ICD-10-CM

## 2023-10-02 DIAGNOSIS — D469 Myelodysplastic syndrome, unspecified: Secondary | ICD-10-CM | POA: Diagnosis not present

## 2023-10-02 DIAGNOSIS — D708 Other neutropenia: Secondary | ICD-10-CM

## 2023-10-02 NOTE — Telephone Encounter (Signed)
 Call received from [facility] from [facility] requesting for pt to be set up for 2 units of PRBC's on Monday, 10/05/23 d/t a HGB of 8.2 today at their clinic.  Dr. Timmy notified and approves orders to give pt 2 units of PRBC's on Monday. Message sent to scheduling.  Thersia states that she will inform pt to be at this office at 0745 on Monday, 10/05/23 to receive 2 units of PRBC's.

## 2023-10-02 NOTE — Progress Notes (Signed)
 ------------------------------------------------------------------------------- Attestation signed by Laverna Glory Punches, MD at 10/03/2023  8:59 PM I have seen and examined the patient in conjunction with the  midlevel provider and have formulated the treatment plan.  Please refer to the associated note for more details.  This was a shared visit and I performed the substantive portion of the visit based on the entirety of MDM.  I have reviewed the relevant documentation in the record. The level of service was chosen based on MDM.     The patient has the following issues:  There is no problem list on file for this patient.   LABS  Recent Labs    Units 09/30/23 0850 10/02/23 0716  White Blood Cell K/uL 0.4* 0.4*  Manual Neutrophil Abs K/uL 0.21* 0.19*  Hemoglobin g/dL 8.6* 8.2*  Platelet K/uL 39* 32*  Blasts % %  --  1.0*   Recent Labs    Units 09/30/23 0850 10/02/23 0716  Sodium Level mmol/L 138 138  Potassium Level mmol/L 4.1 4.0  Chloride mmol/L  105 104  CO2 mmol/L  28 24  BUN mg/dL 15 20  Creatinine mg/dL 8.67* 8.71*  Glucose Level mg/dL 895*  --   Glucose Random mg/dL  --  832  Calcium  Level Total mg/dL 9.9 9.3   Recent Labs    Units 09/30/23 0850 10/02/23 0716  ALT U/L 13 12  AST U/L 12 13  Alkaline Phosphatase U/L 69 72  Bilirubin Total mg/dL 0.4 0.5  LDH U/L 781 781    A complete review system was performed documented in the associated note.  The patient is here with his wife.  Overall is feeling okay.  Some fatigue.  Denies any fevers, chills, night sweats, nausea, vomiting, diarrhea, bleeding, bruising.  He has no neurologic symptoms.  Performance status is 1.  Physical exam was performed and is further documented in the associated note.  No acute findings.  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  He was receiving single agent Vidaza  at home.  He has received 4 cycles of therapy, his last cycle was started recently and is currently approximately day 10 of therapy.  He was significantly pancytopenic on admission, but noted that he was having worsening transfusion requirement, cytopenias, no improvement.  We did complete workup..  Preliminary blast count shows 18 to 20% blast in the bone marrow.  He comes back today to discuss treatment options.  1.  MDS/AML.  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  We discussed with him and his wife his overall disease status, the overall adverse prognosis associated with treated secondary AML.  He had received 4 cycles of Aza, followed by transformation to a higher grade disease, possibly AML.  We are waiting for the final results including final blast count, as well as genomics such as NGS sequencing, and cytogenetics to help determine prognosis, as well as potential targeted treatment options.  We discussed that he would need a change in therapy, likely venetoclax based approach either on  or off clinical trial.  Standard of care options include HMA plus venetoclax.  Protocol options may include cladribine plus low-dose cytarabine plus venetoclax.  Other options of clinical trials including HMA plus venetoclax backbone, with investigational therapy such as immunotherapy.  Right now the patient would like to return home to [place] , and make sure his wife is settled in and take care of some things before he returns next week for evaluation, and likely admission for the chemotherapy.  Will going start screening for clinical trials,  and have options ready for him.  He will follow-up with his local doctor in the meantime for blood checks, and transfusions as needed.    -------------------------------------------------------------------------------      CLINIC NOTE   Patient: Patrick [name] Age: 83 y.o.  MRN: [id number]  Attending: Laverna Glory Punches, MD   DATE OF Visit: October 02, 2023  REASON FOR VISIT: Follow up for Acute myeloid leukemia with TET2, [id number]  Routine follow up with treatment planning  Subjective  CURRENT THERAPY Treatment planning  COURSE/DAY:     PATHOLOGY: Subsequent BMAs: Date Blast % Morph CG NGS FLOW  09/30/2023 18% (prelim) pending pending pending pending   CURRENT ANTIMICROBIALS: Levofloxacin  500 mg daily and Ciprofloxacin  500 mg BID Famciclovir  250 mg daily  Will talk to patient about stopping one of the fluoroquinolones    Active Treatments     None       PRIOR THERAPY: Azacitadine 75 mg/m2 IV q day x 7 day C1D1 06/29/23 subq C2D1 07/27/23, changed to IV because he did not like the injections  C3D1 08/24/23 IV C4D1 09/21/23 - last day was yesterday, 8/5  TREATMENT HISTORY: Oncology History   No problem history exists.      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 He reports feeling fatigue today, describing it as weak and woozy, particularly in the mornings. He has required two transfusions recently, one for platelets and one for blood. His current platelet count is 32, which is the lowest it has been. He is concerned about the logistics of receiving treatment, as he resides in   and is considering staying in the hospital for the initial treatment phase.  Recent bone marrow biopsy results showing 18% blasts (20% by flow cytometry).   He is currently on Lovenox  once daily due to an artificial heart valve and is concerned about  remaining off this medication, even with thrombocytopenia.   He has a history of neuropathy in his feet, which has been present for a couple of years and is not improving, though it does not prevent him from walking.  He has occasional tingling in his feet due to neuropathy.   Past Medical History:  Diagnosis Date  . Anemia 2023   Refractory Anemia with Excess RAEB-1  . Arthritis 2019   In hands  . Basal cell carcinoma of skin 2015   Mohs surgery  . Benign prostatic hyperplasia 1970   Had operation (Radiologist)  . Diverticulitis 2015   comes and goes  . Eczema 2015   Under control  . Gallstone 2017   untreated  . Gastric reflux 2019   taking daily med  . Migraine 1973   still have visual phenomena but no pain  . Neuropathy 2022   usually at night  . Pneumonia 1954  . Renal stone 1968   none for the last 30 years  .  Linda  has a past medical history of Anemia (2023), Arthritis (2019), Basal cell carcinoma of skin (2015), Benign prostatic hyperplasia (1970), Diverticulitis (2015), Eczema (2015), Gallstone (2017), Gastric reflux (2019), Migraine (1973), Neuropathy (2022), Pneumonia (1954), and Renal stone (1968).  Review of Systems  Constitutional:  Positive for fatigue. Negative for chills, diaphoresis and fever.  HENT:  Negative for mouth sores and nosebleeds.   Respiratory:  Negative for cough, shortness of breath and wheezing.   Cardiovascular:  Negative for chest pain.  Gastrointestinal:  Negative for abdominal pain, constipation, diarrhea, nausea and vomiting.  Neurological:  Positive for weakness.    Allergies  Allergen Reactions  . Codeine  Other (See Comments) and Rash    ...  . Terbinafine Itching     Nutrition status: Nutrition status: no nutritional compromise at this time ECOG performance status: (1) Restricted in physically strenuous activity, ambulatory and able to do work of light nature.  Need for nutrition consult? YES NO: no  Current  Medications  Medication Sig Start Date End Date Taking? Authorizing Provider  amLODIPine  (NORVASC ) 5 mg tablet Take 1 tablet (5 mg) by mouth daily.  2003-[facility]--has flutter without amlodipine  11/24/22  Yes HISTORICAL PROVIDER  atorvastatin  (LIPITOR) 10 mg tablet Take 1 tablet (10 mg) by mouth daily. 06/09/23  Yes HISTORICAL PROVIDER  ciprofloxacin  HCl (CIPRO ) 500 mg tablet Take 1 tablet (500 mg) by mouth daily as needed (diverticulosis). 09/21/23  Yes HISTORICAL PROVIDER  diphenoxylate -atropine  (LOMOTIL ) 2.5 mg-0.025 mg per tablet Take 1 tablet by mouth every 6 (six) hours as needed for diarrhea. 04/17/23  Yes HISTORICAL PROVIDER  famciclovir  (FAMVIR ) 250 mg tablet Take 1 tablet (250 mg) by mouth daily. 06/29/23  Yes HISTORICAL PROVIDER  itraconazole  (SPORANOX ) 100 mg capsule Take 1 capsule (100 mg) by mouth daily. 06/29/23  Yes HISTORICAL PROVIDER  levoFLOXacin  (LEVAQUIN ) 250 mg tablet Take 1 tablet (250 mg) by mouth daily. 09/26/23  Yes HISTORICAL PROVIDER  ondansetron  (ZOFRAN ) 8 mg tablet Take 1 tablet (8 mg) by mouth every 8 (eight) hours as needed for nausea.  Takes every AM when he gets infusion 06/29/23  Yes HISTORICAL PROVIDER  pantoprazole  (PROTONIX ) 40 mg EC tablet Take 1 tablet (40 mg) by mouth every morning before breakfast.   Yes HISTORICAL PROVIDER  SUMAtriptan  (IMITREX ) 25 mg tablet Take 1 tablet (25 mg) by mouth as needed for migraine. 05/17/22  Yes HISTORICAL PROVIDER  tadalafil  (CIALIS ) 5 mg tablet Take 1 tablet (5 mg) by mouth daily as needed for erectile dysfunction. 09/10/23  Yes HISTORICAL PROVIDER  enoxaparin  (LOVENOX ) 120 mg/0.8 mL prefilled syringe Inject 0.8 mL (120 mg) under the skin daily. Patient not taking: Reported on 10/02/2023 06/29/23   HISTORICAL PROVIDER  rOPINIRole  (REQUIP ) 0.25 mg tablet Take 1 tablet (0.25 mg) by mouth 3 (three) times a day. Patient not taking: Reported on 10/02/2023 09/28/23   HISTORICAL PROVIDER  triamcinolone  (KENALOG ) 0.1% cream Apply 1 application  topically to  affected area(s) as needed for rash. Patient not taking: Reported on 10/02/2023 11/06/22   HISTORICAL PROVIDER    Family History  Problem Relation Name Age of Onset  . Ovarian cancer Mother Mervyn JULIANNA Clause     No past surgical history on file. He  has no past surgical history on file.  Family History  Problem Relation Name Age of Onset  . Ovarian cancer Mother Mervyn JULIANNA Clause     @SOC1 @   Objective Vitals:   10/02/23 0725  BP: 119/71  Pulse: 72  Resp: 18  Temp:  36.3 C (97.3 F)  TempSrc: Oral  SpO2: 98%  Weight: 74 kg (163 lb 2.3 oz)   Weight  10/02/23 74 kg (163 lb 2.3 oz)  09/30/23 74.3 kg (163 lb 12.8 oz)   Blood Pressure  10/02/23 119/71  09/30/23 (!) 167/82  09/30/23 129/71   Heart Rate  10/02/23 72  09/30/23 68  09/30/23 71   Temperature  10/02/23 36.3 C (97.3 F) (Oral)  09/30/23 36.5 C (97.7 F) (Oral)   Respirations  10/02/23 18  09/30/23 18   Wt Readings from Last 3 Encounters:  10/02/23 74 kg (163 lb 2.3 oz)  09/30/23 74.3 kg (163 lb 12.8 oz)   ECOG Status: 1 - Symptomatic but completely ambulatory     Physical Exam Vitals and nursing note reviewed.  Constitutional:      General: He is not in acute distress.    Appearance: He appears well-developed. He is not diaphoretic.  HENT:     Head: Normocephalic and atraumatic.     Nose: Nose normal.     Mouth/Throat:     Pharynx: Uvula midline.     Right Ear: Tympanic membrane and external ear normal.     Left Ear: Tympanic membrane and external ear normal.  Eyes:     General: No scleral icterus.    Conjunctiva/sclera: Conjunctivae normal.     Pupils: Pupils are equal, round, and reactive to light.  Neck:     Thyroid : No thyromegaly.     Vascular: No JVD.  Cardiovascular:     Rate and Rhythm: Normal rate and regular rhythm.     Heart sounds: Murmur heard.     Systolic murmur is present.     No friction rub. No gallop.     Comments: Click from AV valve Pulmonary:     Effort: Pulmonary  effort is normal. No accessory muscle usage or respiratory distress.     Breath sounds: Normal breath sounds. No wheezing or rales.  Chest:     Chest wall: No tenderness.  Abdominal:     General: Bowel sounds are normal. There is no distension or abdominal bruit.     Palpations: Abdomen is soft.     Tenderness: There is no abdominal tenderness.     Hernia: No hernia is present.  Musculoskeletal:        General: No tenderness. Normal range of motion.     Cervical back: Normal range of motion.  Lymphadenopathy:     Cervical: No cervical adenopathy.  Skin:    General: Skin is warm.  Neurological:     Mental Status: He is alert and oriented to person, place, and time.     Coordination: Coordination normal.  Psychiatric:        Behavior: Behavior normal. Behavior is cooperative.        Thought Content: Thought content normal.        Judgment: Judgment normal.    Physical Exam MUSCULOSKELETAL: No spinal tenderness.   LABORATORY DATA/IMAGING/PATHOLOGY: I have reviewed pertinent laboratory results and pertinent pathology data. Results LABS Hb: 8.2 (10/02/2023) PLT: 32 (10/02/2023)  PATHOLOGY Bone marrow biopsy: 18% to 20% blasts (09/30/2023)  LABORATORY DATA: HEMATOLOGY Recent Labs    Units 09/30/23 0850 10/02/23 0716  White Blood Cell K/uL 0.4* 0.4*  Hemoglobin g/dL 8.6* 8.2*  Hematocrit % 24.2* 23.7*  Mean Cell Volume fL 103* 104*  Platelet K/uL 39* 32*  Manual Neutrophil Abs K/uL 0.21* 0.19*   Lab Results  Component Value Date  BLASTS 1.0 (H) 10/02/2023   Results from last 7 days  Lab Units 10/02/23 0716 09/30/23 0850  LYMPHOCYTE ABS K/uL 0.15* 0.14*  MONOCYTE ABS K/uL 0.00* 0.01*  EOSINOPHIL ABS K/uL 0.04 0.04  BASOPHIL ABS K/uL  --  0.01*     CHEMISTRY Recent Labs    Units 09/30/23 0850 10/02/23 0716  Glucose Level mg/dL 895*  --   Glucose Random mg/dL  --  832  Calcium  Level Total mg/dL 9.9 9.3  Sodium Level mmol/L 138 138  Potassium Level  mmol/L 4.1 4.0  CO2 mmol/L  28 24  Chloride mmol/L  105 104  BUN mg/dL 15 20  Creatinine mg/dL 8.67* 8.71*  eGFR fO/fpw/8.26 sq. m 54* 56*  Magnesium  Level mg/dL 2.2 2.1  Phosphorus Level mg/dL 2.0* 2.3*    LFTs Recent Labs    Units 09/30/23 0850 10/02/23 0716  Albumin Level gm/dL 4.3 4.1  ALT U/L 13 12  AST U/L 12 13  Alkaline Phosphatase U/L 69 72  Bilirubin Total mg/dL 0.4 0.5  LDH U/L 781 781  Uric Acid mg/dL 3.1* 3.6     @LABALLVALUES (TSH:5,T4:5,T3:5)@ @LABALLVALUES (HGBA1)@  COAGS Recent Labs    Units 09/30/23 0850  Prothrombin Time second(s) 14.6*  Activated PTT second(s) 33.1  International Normalization Ratio  1.16*  D-Dimer mcg/ml FEU 1.41*  Fibrinogen mg/dL 598     Cardiac Enzymes Recent Labs    Units 09/30/23 0850  NT-ProBNP pg/mL 121     Inflammatory Markers No results for input(s): PROCALCITO, SEDRATE, CRP, IL6 in the last 72 hours.  Invalid input(s): IL1   Infusions: No current facility-administered medications for this visit.    Meds:   Abx:     Nutrition: Active Orders  There are no active orders of the following types: Diet, Nourishments.        MICROBIOLOGY Cultures: Results     ** No results found for the last 168 hours. **        @MICRORESULTS14D @    IMAGING: Imaging: No results found. No results found.  Interpretation: I have reviewed the pertinent radiographic imaging.      Assessment & Plan Assessment & Plan 1.Acute myeloid leukemia with myelodysplasia-related changes Bone marrow biopsy showed 18-20% blasts, indicating progression to AML. Discussed prognosis and need for therapy change. Options include combination therapy with Cladribine, low-dose cytarabine, and venetoclax vs other clinical trials. Patient plans to return in 1 week. Will have EKG and ECHO on return.   2. 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 No transfusions today due  to travel. We contacted Dr. Jessy team who will sent him up for labs on Monday. In addition, I was informed that Mr. Dinovo can present to [facility] tomorrow for transfusion.  The patient will proceed with the latter option.  Patient to continue prophylactic antimicrobials.   3. 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  Patient permitted to continue Lovenox .  Consult with cardiologist and local doctor for safe anticoagulation plan.   Jed Kutch is aware of the indications to report to the [facility] to include a fever of 100.5 or greater, chills, spontaneous bleeding, or any other symptoms requiring immediate intervention.  This was a shared visit with Dr. Laverna. I spent 30 minutes of reportable time on the date of the visit, which does not include any overlapping time spent with the billing provider.  Dr. Laverna was present  and participated in all aspects of this patient's care.   Please see his associated note for further details on today's visit and care plan. All questions were tended to and answered to the best of our ability.   The patient understands and is in agreement with current plan and recommendations. The patient was informed to contact the clinic if any futher questions arise.  This document was created using a voice recognition transcribing system. Incorrect words or phrases may have been missed during proofreading. Please interpret accordingly.  I obtained verbal consent from the patient or their proxy to record this visit for purposes of producing a draft of the encounter documentation.   Alexis C. Geppner, MLS, CTTS,  MPAS, PA-C Department of Leukemia

## 2023-10-03 ENCOUNTER — Emergency Department (HOSPITAL_COMMUNITY)
Admission: EM | Admit: 2023-10-03 | Discharge: 2023-10-03 | Disposition: A | Discharge status: Home / Self Care | Attending: Emergency Medicine | Admitting: Emergency Medicine

## 2023-10-03 ENCOUNTER — Encounter (HOSPITAL_COMMUNITY): Payer: Self-pay

## 2023-10-03 ENCOUNTER — Other Ambulatory Visit: Payer: Self-pay

## 2023-10-03 ENCOUNTER — Encounter: Payer: Self-pay | Admitting: Hematology & Oncology

## 2023-10-03 DIAGNOSIS — Z79899 Other long term (current) drug therapy: Secondary | ICD-10-CM | POA: Insufficient documentation

## 2023-10-03 DIAGNOSIS — D709 Neutropenia, unspecified: Secondary | ICD-10-CM | POA: Diagnosis not present

## 2023-10-03 DIAGNOSIS — Z7901 Long term (current) use of anticoagulants: Secondary | ICD-10-CM | POA: Insufficient documentation

## 2023-10-03 DIAGNOSIS — I10 Essential (primary) hypertension: Secondary | ICD-10-CM | POA: Diagnosis not present

## 2023-10-03 DIAGNOSIS — R7989 Other specified abnormal findings of blood chemistry: Secondary | ICD-10-CM | POA: Diagnosis present

## 2023-10-03 DIAGNOSIS — R011 Cardiac murmur, unspecified: Secondary | ICD-10-CM | POA: Diagnosis not present

## 2023-10-03 DIAGNOSIS — R0602 Shortness of breath: Secondary | ICD-10-CM | POA: Diagnosis not present

## 2023-10-03 DIAGNOSIS — D649 Anemia, unspecified: Secondary | ICD-10-CM | POA: Insufficient documentation

## 2023-10-03 LAB — CBC WITH DIFFERENTIAL/PLATELET
Abs Immature Granulocytes: 0 K/uL (ref 0.00–0.07)
Basophils Absolute: 0 K/uL (ref 0.0–0.1)
Basophils Relative: 2 %
Eosinophils Absolute: 0 K/uL (ref 0.0–0.5)
Eosinophils Relative: 2 %
HCT: 24.8 % — ABNORMAL LOW (ref 39.0–52.0)
Hemoglobin: 8.2 g/dL — ABNORMAL LOW (ref 13.0–17.0)
Immature Granulocytes: 0 %
Lymphocytes Relative: 49 %
Lymphs Abs: 0.2 K/uL — ABNORMAL LOW (ref 0.7–4.0)
MCH: 35.2 pg — ABNORMAL HIGH (ref 26.0–34.0)
MCHC: 33.1 g/dL (ref 30.0–36.0)
MCV: 106.4 fL — ABNORMAL HIGH (ref 80.0–100.0)
Monocytes Absolute: 0 K/uL — ABNORMAL LOW (ref 0.1–1.0)
Monocytes Relative: 6 %
Neutro Abs: 0.2 K/uL — CL (ref 1.7–7.7)
Neutrophils Relative %: 41 %
Platelets: 32 K/uL — ABNORMAL LOW (ref 150–400)
RBC: 2.33 MIL/uL — ABNORMAL LOW (ref 4.22–5.81)
RDW: 17.2 % — ABNORMAL HIGH (ref 11.5–15.5)
WBC: 0.5 K/uL — CL (ref 4.0–10.5)
nRBC: 0 /100{WBCs}

## 2023-10-03 LAB — BASIC METABOLIC PANEL WITH GFR
Anion gap: 12 (ref 5–15)
BUN: 18 mg/dL (ref 8–23)
CO2: 24 mmol/L (ref 22–32)
Calcium: 9.4 mg/dL (ref 8.9–10.3)
Chloride: 101 mmol/L (ref 98–111)
Creatinine, Ser: 1.1 mg/dL (ref 0.61–1.24)
GFR, Estimated: >60 mL/min (ref >60)
Glucose, Bld: 122 mg/dL — ABNORMAL HIGH (ref 70–99)
Potassium: 4.1 mmol/L (ref 3.5–5.1)
Sodium: 137 mmol/L (ref 135–145)

## 2023-10-03 LAB — PREPARE RBC (CROSSMATCH)

## 2023-10-03 MED ORDER — SODIUM CHLORIDE 0.9% IV SOLUTION: Freq: Once | INTRAVENOUS | Status: DC

## 2023-10-03 NOTE — ED Provider Notes (Signed)
 [facility] EMERGENCY DEPARTMENT AT [facility] Provider Note   CSN: [id number] Arrival date & time: 10/03/23  9050     Patient presents with: abnormal labs   Patrick [name] is a 83 y.o. male who presents to the emergency department with a chief complaint of symptomatic anemia.  Patient states that this happened one time previously where he was experiencing some mild shortness of breath, dizziness, and generalized fatigue and that his symptoms improved after a blood transfusion.  Patient was recently diagnosed with AML and is being seen by [facility] in [place] .  Patient was recently seen by his treatment team in [place]  and told that he would benefit from a blood transfusion.  Most recent labs from [place]  show a hemoglobin of 8.5 with platelets of 45.  Patient has had 2 blood transfusions previously 1 being packed red blood cells and the other being for platelets through Dr. Jessy office in [facility].  Patient has a follow-up appointment scheduled for Monday with Dr. Jessy office for lab recheck and ongoing diagnosis and treatment.  Patient was instructed by his treatment team in [place]  to continue his Lovenox  for his mechanical heart valve despite anemia and thrombocytopenia.  Patient has a past medical history significant for thrombocytopenia, leukopenia, myelodysplasia high-grade, refractory anemia with excess blasts, dyslipidemia, migraine headaches, hypertension, first-degree AV block, thoracic aortic aneurysm, BPH, history of aortic valve replacement, anticoagulant long-term use, diverticulitis, erectile dysfunction, osteoarthritis, trigeminal neuralgia, etc. Patient denies fever, chills, chest pain, abdominal pain, nausea, or vomiting. Patient states that he is just here for the blood transfusion.    HPI     Prior to Admission medications   Medication Sig Start Date End Date Taking? Authorizing Provider  amLODipine  (NORVASC ) 5 MG tablet TAKE ONE TABLET BY MOUTH ONE  TIME DAILY 11/24/22   Verlin Lonni BIRCH, MD  atorvastatin  (LIPITOR) 10 MG tablet TAKE ONE TABLET BY MOUTH ONE TIME DAILY 06/09/23   Webb, Padonda B, FNP  ciprofloxacin  (CIPRO ) 500 MG tablet Take 1 tablet (500 mg total) by mouth daily with breakfast. 09/21/23   Timmy Maude SAUNDERS, MD  diphenoxylate -atropine  (LOMOTIL ) 2.5-0.025 MG tablet Take 1 tablet by mouth 4 (four) times daily as needed for diarrhea or loose stools. 04/17/23   [name], Aleksei V, MD  enoxaparin  (LOVENOX ) 120 MG/0.8ML injection Inject 0.8 mLs (120 mg total) into the skin daily. 06/29/23   Timmy Maude SAUNDERS, MD  famciclovir  (FAMVIR ) 250 MG tablet Take 1 tablet (250 mg total) by mouth daily. 06/29/23   Timmy Maude SAUNDERS, MD  hydrocortisone 2.5 % cream Apply 1 Application topically daily as needed. 04/28/23   [provider]  itraconazole  (SPORANOX ) 100 MG capsule Take 1 capsule (100 mg total) by mouth daily. 06/29/23   Timmy Maude SAUNDERS, MD  pantoprazole  (PROTONIX ) 40 MG tablet TAKE ONE TABLET BY MOUTH ONE TIME DAILY 07/17/23   [name], Aleksei V, MD  rOPINIRole  (REQUIP ) 0.25 MG tablet Take 1 tablet (0.25 mg total) by mouth 3 (three) times daily. Take 1 tablet 1 hour before bedtime.  In 3 days, take 2 tablets 1 hour before bedtime.  In 1 week take 4 tablets 1 hour before bedtime 09/28/23   Timmy Maude SAUNDERS, MD  SUMAtriptan  (IMITREX ) 25 MG tablet TAKE ONE TABLET BY MOUTH AT ONSET OF HEADACHE. MAY REPEAT DOSE WITH ONE TABLET IN TWO HOURS IF NEEDED. DO NOT EXCEED TWO TABLETS IN 24 HOURS. 05/17/22   [name], Karlynn GAILS, MD  tadalafil  (CIALIS ) 5 MG tablet  Take 1 tablet (5 mg total) by mouth daily. 09/10/23   Francisca Redell BROCKS, MD    Allergies: Codeine  and Terbinafine    Review of Systems  Constitutional:  Positive for fatigue.    Updated Vital Signs BP (!) 133/91   Pulse (!) 59   Temp 97.9 F (36.6 C) (Oral)   Resp 17   SpO2 98%   Physical Exam Vitals and nursing note reviewed.  Constitutional:      General: He is awake. He is  not in acute distress.    Appearance: Normal appearance. He is not ill-appearing, toxic-appearing or diaphoretic.     Comments: Patient sitting up in bed comfortably looking at phone  HENT:     Head: Normocephalic and atraumatic.  Eyes:     General: No scleral icterus. Cardiovascular:     Rate and Rhythm: Normal rate and regular rhythm.     Heart sounds: Murmur heard.  Pulmonary:     Effort: Pulmonary effort is normal. No respiratory distress.     Breath sounds: No wheezing, rhonchi or rales.  Musculoskeletal:        General: Normal range of motion.     Right lower leg: No edema.     Left lower leg: No edema.  Skin:    General: Skin is warm.     Capillary Refill: Capillary refill takes less than 2 seconds.  Neurological:     General: No focal deficit present.     Mental Status: He is alert and oriented to person, place, and time.     GCS: GCS eye subscore is 4. GCS verbal subscore is 5. GCS motor subscore is 6.  Psychiatric:        Mood and Affect: Mood normal.        Behavior: Behavior normal. Behavior is cooperative.     (all labs ordered are listed, but only abnormal results are displayed) Labs Reviewed  CBC WITH DIFFERENTIAL/PLATELET - Abnormal; Notable for the following components:      Result Value   WBC 0.5 (*)    RBC 2.33 (*)    Hemoglobin 8.2 (*)    HCT 24.8 (*)    MCV 106.4 (*)    MCH 35.2 (*)    RDW 17.2 (*)    Platelets 32 (*)    Neutro Abs 0.2 (*)    Lymphs Abs 0.2 (*)    Monocytes Absolute 0.0 (*)    All other components within normal limits  BASIC METABOLIC PANEL WITH GFR - Abnormal; Notable for the following components:   Glucose, Bld 122 (*)    All other components within normal limits  TYPE AND SCREEN  PREPARE RBC (CROSSMATCH)    EKG: EKG Interpretation Date/Time:  Saturday October 03 2023 10:13:51 EDT Ventricular Rate:  67 PR Interval:  253 QRS Duration:  88 QT Interval:  434 QTC Calculation: 459 R Axis:   58  Text Interpretation: Sinus  rhythm Prolonged PR interval Baseline wander in lead(s) V1 similar to Sept 2024 Confirmed by Freddi Hamilton [phone]) on 10/03/2023 12:54:35 PM  Radiology: No results found.   Procedures   Medications Ordered in the ED  0.9 %  sodium chloride  infusion (Manually program via Guardrails IV Fluids) (has no administration in time range)                                    Medical Decision Making Amount and/or  Complexity of Data Reviewed Labs: ordered.  Risk Prescription drug management.   Patient presents to the ED for concern of symptomatic anemia, blood transfusion, this involves an extensive number of treatment options, and is a complaint that carries with it a high risk of complications and morbidity.  The differential diagnosis includes anemia, thrombocytopenia, stroke, cancer progression, GI bleed, acute blood loss, iron deficiency anemia, anemia of chronic disease, etc.   Co morbidities that complicate the patient evaluation  thrombocytopenia, leukopenia, myelodysplasia high-grade, refractory anemia with excess blasts, dyslipidemia, migraine headaches, hypertension, first-degree AV block, thoracic aortic aneurysm, BPH, history of aortic valve replacement, anticoagulant long-term use, diverticulitis, erectile dysfunction, osteoarthritis, trigeminal neuralgia   Additional history obtained:  Chart reviewed including telephone encounter yesterday at 12:15pm where there is documentation from a call from a PA at [facility] requesting for patient to be set up for 2 units of PRBCs on Monday, 10/05/2023 due to hemoglobin of 8.2 at their clinic in [place] , Dr. Timmy who is the patient's oncologist and [place] was notified and approve the order to give 2 units of packed red blood cells on Monday, however I also see documentation from [facility] note where patient was offered to come to [facility] over the weekend for transfusion   Lab Tests:  I Ordered, and personally interpreted labs.   The pertinent results include:     Cardiac Monitoring:  The patient was maintained on a cardiac monitor.  I personally viewed and interpreted the cardiac monitored which showed an underlying rhythm of: sinus rhythm   Medicines ordered and prescription drug management:  I ordered medication including 2 units of packed red blood cells for symptomatic anemia Reevaluation of the patient after these medicines showed that the patient improved I have reviewed the patients home medicines and have made adjustments as needed   Test Considered:  Patient offered CXR for lingering cough however he declined at this time stating he believes it is just related to post-nasal drainage, patient afebrile low clinical suspicion for pneumonia at this time    Critical Interventions:  None   Problem List / ED Course:  83 year old male, symptomatic anemia with recent diagnosis of AML being followed by [facility] in [place]  as well as Dr. Timmy in [facility], documentation showing request for 2 units of packed red blood cell transfusion either by [facility] over the weekend or at Dr. Jessy office on Monday Vital signs stable, patient not in acute distress Denies blood in stool, fever, chills, or other infectious symptoms Patient overall well-appearing, physical exam unremarkable, order placed for CBC, BMP as well as blood transfusion orders including 2 units of packed red blood cells Plan to transfuse and then reassess, patient was consented for blood by myself including the risk of allergic/transfusion reaction as well as the risk of infection, patient agreed to blood transfusion Patient still feeling well and ready to leave following transfusions, patient states symptoms of generalized fatigue, subjective shortness of breath, and dizziness have somewhat improved Return precautions given Patient instructed to follow-up as scheduled with oncologist team here and in [place] , patient states that he has  good follow-up with upcoming appointments this week  Most likely diagnosis at this time is ongoing sequela of recent AML diagnosis, suspicious that patient is presenting with symptomatic anemia type symptoms due to reduced hemoglobin due to AML, patient only interested in blood transfusion today, not interested in chest x-ray for lingering cough, EKG was performed due to dizziness with no acute abnormality  noted, patient instructed to follow-up with his oncology team here and in [place]   Reevaluation:  After the interventions noted above, I reevaluated the patient and found that they have :improved   Social Determinants of Health:  New diagnosis of AML Cancer   Dispostion:  After consideration of the diagnostic results and the patients response to treatment, I feel that the patent would benefit from discharge and outpatient follow-up with primary care and specialists including Dr. Jessy office in [facility] as well as [facility] for ongoing diagnosis and treatment.  Please make your primary care as well as specialist aware of your transfusion and visit today.     Final diagnoses:  Symptomatic anemia  Neutropenia, unspecified type [facility])    ED Discharge Orders     None          Janetta Terrall F[place], [place] 10/03/23 1803    Freddi Hamilton, MD 10/04/23 [phone]

## 2023-10-03 NOTE — ED Triage Notes (Addendum)
 Pt arrived via POV for abnormal labs related to lukemia diagnosis.  Pt was advised to seek tx for low hmg, 8.6   Pt is A&O x 4 and in no obvious distress

## 2023-10-03 NOTE — Discharge Instructions (Addendum)
 It was a pleasure taking care of you today. Today you were given a blood transfusion to help with your anemia like symptoms. You were transfused with 2 units of packed red blood cells. Please continue to monitor your symptoms and discuss your visit today with your oncologist in [facility] as well as your oncology team at [facility].  If you experience the following symptoms including but not limited to fever, chills, chest pain, shortness of breath, worsening fatigue, worsening dizziness, or other concerning symptoms please return to the emergency department or seek further medical care. Please follow-up with your primary care and specialists as scheduled, sooner if symptoms warrant.

## 2023-10-05 ENCOUNTER — Inpatient Hospital Stay (HOSPITAL_BASED_OUTPATIENT_CLINIC_OR_DEPARTMENT_OTHER): Admitting: Hematology & Oncology

## 2023-10-05 ENCOUNTER — Inpatient Hospital Stay

## 2023-10-05 ENCOUNTER — Encounter: Payer: Self-pay | Admitting: Hematology & Oncology

## 2023-10-05 ENCOUNTER — Telehealth: Payer: Self-pay

## 2023-10-05 VITALS — BP 130/70 | HR 69 | Temp 98.4°F | Resp 18 | Ht 71.0 in | Wt 165.4 lb

## 2023-10-05 DIAGNOSIS — D46Z Other myelodysplastic syndromes: Secondary | ICD-10-CM

## 2023-10-05 DIAGNOSIS — D4621 Refractory anemia with excess of blasts 1: Secondary | ICD-10-CM | POA: Diagnosis not present

## 2023-10-05 DIAGNOSIS — Z79899 Other long term (current) drug therapy: Secondary | ICD-10-CM | POA: Diagnosis not present

## 2023-10-05 DIAGNOSIS — Z5111 Encounter for antineoplastic chemotherapy: Secondary | ICD-10-CM | POA: Diagnosis not present

## 2023-10-05 LAB — CBC WITH DIFFERENTIAL (CANCER CENTER ONLY)
Abs Immature Granulocytes: 0 K/uL (ref 0.00–0.07)
Basophils Absolute: 0 K/uL (ref 0.0–0.1)
Basophils Relative: 0 %
Eosinophils Absolute: 0 K/uL (ref 0.0–0.5)
Eosinophils Relative: 2 %
HCT: 26.3 % — ABNORMAL LOW (ref 39.0–52.0)
Hemoglobin: 9.3 g/dL — ABNORMAL LOW (ref 13.0–17.0)
Immature Granulocytes: 0 %
Lymphocytes Relative: 49 %
Lymphs Abs: 0.2 K/uL — ABNORMAL LOW (ref 0.7–4.0)
MCH: 35 pg — ABNORMAL HIGH (ref 26.0–34.0)
MCHC: 35.4 g/dL (ref 30.0–36.0)
MCV: 98.9 fL (ref 80.0–100.0)
Monocytes Absolute: 0 K/uL — ABNORMAL LOW (ref 0.1–1.0)
Monocytes Relative: 4 %
Neutro Abs: 0.2 K/uL — CL (ref 1.7–7.7)
Neutrophils Relative %: 45 %
Platelet Count: 27 K/uL — ABNORMAL LOW (ref 150–400)
RBC: 2.66 MIL/uL — ABNORMAL LOW (ref 4.22–5.81)
RDW: 19 % — ABNORMAL HIGH (ref 11.5–15.5)
Smear Review: NORMAL
WBC Count: 0.5 K/uL — CL (ref 4.0–10.5)
nRBC: 0 % (ref 0.0–0.2)

## 2023-10-05 LAB — BPAM RBC
Blood Product Expiration Date: 202509092359
Blood Product Expiration Date: 202509092359
ISSUE DATE / TIME: 202508091137
ISSUE DATE / TIME: 202508091424
Unit Type and Rh: 5100
Unit Type and Rh: 5100

## 2023-10-05 LAB — TYPE AND SCREEN
ABO/RH(D): O POS
Antibody Screen: NEGATIVE
Unit division: 0
Unit division: 0

## 2023-10-05 LAB — CMP (CANCER CENTER ONLY)
ALT: 14 U/L (ref 0–44)
AST: 14 U/L — ABNORMAL LOW (ref 15–41)
Albumin: 4 g/dL (ref 3.5–5.0)
Alkaline Phosphatase: 64 U/L (ref 38–126)
Anion gap: 8 (ref 5–15)
BUN: 16 mg/dL (ref 8–23)
CO2: 26 mmol/L (ref 22–32)
Calcium: 9.4 mg/dL (ref 8.9–10.3)
Chloride: 106 mmol/L (ref 98–111)
Creatinine: 1.02 mg/dL (ref 0.61–1.24)
GFR, Estimated: >60 mL/min (ref >60)
Glucose, Bld: 111 mg/dL — ABNORMAL HIGH (ref 70–99)
Potassium: 4.7 mmol/L (ref 3.5–5.1)
Sodium: 140 mmol/L (ref 135–145)
Total Bilirubin: 0.5 mg/dL (ref 0.0–1.2)
Total Protein: 5.8 g/dL — ABNORMAL LOW (ref 6.5–8.1)

## 2023-10-05 LAB — SAMPLE TO BLOOD BANK

## 2023-10-05 NOTE — Progress Notes (Signed)
 9 Hematology and Oncology Follow Up Visit  CARSTON RIEDL [id number] Sep 09, 1940 83 y.o. 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 Broner comes in for follow-up.  He did get down to [facility].  He had a very good time down there.  He saw Dr. Laverna who was incredibly thorough.  He had a bone marrow biopsy that was done.  Unfortunately, the bone marrow biopsy did show that there was some progression to AML.  He had 18 - 20% blasts.  Unfortunately, I do not have back the chromosomal studies.  He is going back to [facility] this Friday and be admitted for chemotherapy.  He is not sure what he is going to get.  He is not sure if he is going to be on a medical trial.  He actually came back home and was transfused with 2 units of blood.  I think his hemoglobin was 8.2.  He continues on the Lovenox  for his mechanical heart valve.  He has had no bleeding.  I know this is not good to be easy for him.  I just wonder if he is going to have induction therapy.  I know his age, this would be incredibly challenging.  I know he is not still good shape to take this.  Clearly, he is going to get incredible care down there.  I know this will not be easy.  Anything that we do to help him out appear we certainly shall.  Overall, his appetite seem to be doing pretty well.  He has had no nausea or vomiting.  He has had no fever.  He has had no cough.  He has had no change in bowel or bladder habits.  He has had no leg swelling.  Overall, I would have to say that his performance status is probably ECOG 1.   Medications:  Current Outpatient Medications:    amLODipine  (NORVASC ) 5 MG tablet, TAKE ONE TABLET BY MOUTH ONE TIME DAILY, Disp: 90 tablet, Rfl: 3   atorvastatin  (LIPITOR) 10  MG tablet, TAKE ONE TABLET BY MOUTH ONE TIME DAILY, Disp: 90 tablet, Rfl: 2   ciprofloxacin  (CIPRO ) 500 MG tablet, Take 1 tablet (500 mg total) by mouth daily with breakfast., Disp: 20 tablet, Rfl: 2   diphenoxylate -atropine  (LOMOTIL ) 2.5-0.025 MG tablet, Take 1 tablet by mouth 4 (four) times daily as needed for diarrhea or loose stools., Disp: 60 tablet, Rfl: 1   enoxaparin  (LOVENOX ) 120 MG/0.8ML injection, Inject 0.8 mLs (120 mg total) into the skin daily., Disp: 24 mL, Rfl: 12   famciclovir  (FAMVIR ) 250 MG tablet, Take 1 tablet (250 mg total) by mouth daily., Disp: 30 tablet, Rfl: 12   hydrocortisone 2.5 % cream, Apply 1 Application topically daily as needed., Disp: , Rfl:    itraconazole  (SPORANOX ) 100 MG capsule, Take 1 capsule (100 mg total) by mouth daily., Disp: 30 capsule, Rfl: 6   pantoprazole  (PROTONIX ) 40 MG tablet, TAKE ONE TABLET BY MOUTH ONE TIME DAILY, Disp: 90 tablet, Rfl: 3   rOPINIRole  (REQUIP ) 0.25 MG tablet, Take 1 tablet (0.25 mg total) by mouth 3 (three) times daily. Take 1 tablet 1 hour before bedtime.  In 3 days, take 2 tablets 1 hour before bedtime.  In 1  week take 4 tablets 1 hour before bedtime, Disp: 60 tablet, Rfl: 2   SUMAtriptan  (IMITREX ) 25 MG tablet, TAKE ONE TABLET BY MOUTH AT ONSET OF HEADACHE. MAY REPEAT DOSE WITH ONE TABLET IN TWO HOURS IF NEEDED. DO NOT EXCEED TWO TABLETS IN 24 HOURS., Disp: 12 tablet, Rfl: 0   tadalafil  (CIALIS ) 5 MG tablet, Take 1 tablet (5 mg total) by mouth daily., Disp: 90 tablet, Rfl: 3  Allergies:  Allergies  Allergen Reactions   Codeine  Rash   Terbinafine Itching    Past Medical History, Surgical history, Social history, and Family History were reviewed and updated.  Review of Systems: Review of Systems  Constitutional: Negative.   HENT:  Negative.    Eyes: Negative.   Respiratory: Negative.    Cardiovascular: Negative.   Gastrointestinal: Negative.   Endocrine: Negative.   Genitourinary: Negative.    Musculoskeletal:  Negative.   Skin: Negative.   Neurological: Negative.   Hematological: Negative.   Psychiatric/Behavioral: Negative.      Physical Exam:  height is 5' 11 (1.803 m) and weight is 165 lb 6.4 oz (75 kg). His oral temperature is 98.4 F (36.9 C). His blood pressure is 130/70 and his pulse is 69. His respiration is 18 and oxygen saturation is 99%.   Wt Readings from Last 3 Encounters:  10/05/23 165 lb 6.4 oz (75 kg)  09/21/23 166 lb 6.4 oz (75.5 kg)  09/10/23 170 lb (77.1 kg)    Physical Exam Vitals reviewed.  HENT:     Head: Normocephalic and atraumatic.  Eyes:     Pupils: Pupils are equal, round, and reactive to light.     Comments: Ocular exam does show the conjunctival hemorrhage with the right eye.  Pupil reacts appropriately.  He has good extraocular muscle movement.  Cardiovascular:     Rate and Rhythm: Normal rate and regular rhythm.     Heart sounds: Normal heart sounds.     Comments: Cardiac exam shows a regular rate and rhythm.  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  I hear no murmurs or rubs or bruits. Pulmonary:     Effort: Pulmonary effort is normal.     Breath sounds: Normal breath sounds.  Abdominal:     General: Bowel sounds are normal.     Palpations: Abdomen is soft.  Musculoskeletal:        General: No tenderness or deformity. Normal range of motion.     Cervical back: Normal range of motion.  Lymphadenopathy:     Cervical: No cervical adenopathy.  Skin:    General: Skin is warm and dry.     Findings: No erythema or rash.  Neurological:     Mental Status: He is alert and oriented to person, place, and time.  Psychiatric:        Behavior: Behavior normal.        Thought Content: Thought content normal.        Judgment: Judgment normal.      Lab Results  Component Value Date   WBC 0.5 (LL) 10/05/2023   HGB 9.3 (L) 10/05/2023   HCT 26.3 (L) 10/05/2023   MCV 98.9 10/05/2023   PLT 27 (L) 10/05/2023     Chemistry       Component Value Date/Time   NA 140 10/05/2023 1306   NA 139 11/12/2020 1030   K 4.7 10/05/2023 1306   CL 106 10/05/2023 1306   CO2 26 10/05/2023 1306   BUN  16 10/05/2023 1306   BUN 16 11/12/2020 1030   CREATININE 1.02 10/05/2023 1306      Component Value Date/Time   CALCIUM  9.4 10/05/2023 1306   ALKPHOS 64 10/05/2023 1306   AST 14 (L) 10/05/2023 1306   ALT 14 10/05/2023 1306   BILITOT 0.5 10/05/2023 1306       Impression and Plan: Mr. Fyock is a 83 year old white male.  He has mild leukopenia and thrombocytopenia. I did do a bone marrow biopsy on him.  This confirmed that he has myelodysplasia.  Again, I would classify him as refractory anemia with excess blast-1  (RAEB-1).  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  This was proven by bone marrow biopsy done at [facility].  It sounds like he is going to have another bone marrow biopsy when he goes back down there this Friday.  He will then be admitted.  Again, anything that we do to help out from our end we certainly shall.  Thankfully, the [facility] cancer system is on the EPIC system so I can follow along and see how things are going for him.  I know that he is incredibly tough.  He has a lot of resilience.  Hopefully he will be able to make it through and we can help out when he comes back home.  It sounds like he will be down there for quite a while.  I told him that the key for his prognosis, in my opinion will be the cytogenetics on his bone marrow.   Maude JONELLE Crease, MD 8/11/20255:00 PM

## 2023-10-05 NOTE — Telephone Encounter (Signed)
 Received critical lab results from [facility] in the ED lab. Pt had a WBC count of 0.5 and absolute neutrophils was 0.2. Dr Timmy made aware and has spoken to the pt already.

## 2023-10-06 DIAGNOSIS — D469 Myelodysplastic syndrome, unspecified: Secondary | ICD-10-CM | POA: Diagnosis not present

## 2023-10-06 DIAGNOSIS — Z792 Long term (current) use of antibiotics: Secondary | ICD-10-CM | POA: Diagnosis not present

## 2023-10-06 DIAGNOSIS — Z7901 Long term (current) use of anticoagulants: Secondary | ICD-10-CM | POA: Diagnosis not present

## 2023-10-06 DIAGNOSIS — Z79899 Other long term (current) drug therapy: Secondary | ICD-10-CM | POA: Diagnosis not present

## 2023-10-06 DIAGNOSIS — D46Z Other myelodysplastic syndromes: Secondary | ICD-10-CM | POA: Diagnosis not present

## 2023-10-08 DIAGNOSIS — D469 Myelodysplastic syndrome, unspecified: Secondary | ICD-10-CM | POA: Diagnosis not present

## 2023-10-09 DIAGNOSIS — Z1152 Encounter for screening for COVID-19: Secondary | ICD-10-CM | POA: Diagnosis not present

## 2023-10-09 DIAGNOSIS — N400 Enlarged prostate without lower urinary tract symptoms: Secondary | ICD-10-CM | POA: Diagnosis not present

## 2023-10-09 DIAGNOSIS — D6959 Other secondary thrombocytopenia: Secondary | ICD-10-CM | POA: Diagnosis not present

## 2023-10-09 DIAGNOSIS — Z8041 Family history of malignant neoplasm of ovary: Secondary | ICD-10-CM | POA: Diagnosis not present

## 2023-10-09 DIAGNOSIS — T451X5A Adverse effect of antineoplastic and immunosuppressive drugs, initial encounter: Secondary | ICD-10-CM | POA: Diagnosis not present

## 2023-10-09 DIAGNOSIS — D61810 Antineoplastic chemotherapy induced pancytopenia: Secondary | ICD-10-CM | POA: Diagnosis not present

## 2023-10-09 DIAGNOSIS — C92A1 Acute myeloid leukemia with multilineage dysplasia, in remission: Secondary | ICD-10-CM | POA: Diagnosis not present

## 2023-10-09 DIAGNOSIS — Z885 Allergy status to narcotic agent status: Secondary | ICD-10-CM | POA: Diagnosis not present

## 2023-10-09 DIAGNOSIS — D469 Myelodysplastic syndrome, unspecified: Secondary | ICD-10-CM | POA: Diagnosis not present

## 2023-10-09 DIAGNOSIS — D8481 Immunodeficiency due to conditions classified elsewhere: Secondary | ICD-10-CM | POA: Diagnosis not present

## 2023-10-09 DIAGNOSIS — D61818 Other pancytopenia: Secondary | ICD-10-CM | POA: Diagnosis not present

## 2023-10-09 DIAGNOSIS — C9200 Acute myeloblastic leukemia, not having achieved remission: Secondary | ICD-10-CM | POA: Diagnosis not present

## 2023-10-09 DIAGNOSIS — D696 Thrombocytopenia, unspecified: Secondary | ICD-10-CM | POA: Diagnosis not present

## 2023-10-09 DIAGNOSIS — C9140 Hairy cell leukemia not having achieved remission: Secondary | ICD-10-CM | POA: Diagnosis not present

## 2023-10-09 DIAGNOSIS — D630 Anemia in neoplastic disease: Secondary | ICD-10-CM | POA: Diagnosis not present

## 2023-10-09 DIAGNOSIS — I2510 Atherosclerotic heart disease of native coronary artery without angina pectoris: Secondary | ICD-10-CM | POA: Diagnosis not present

## 2023-10-09 DIAGNOSIS — Z7901 Long term (current) use of anticoagulants: Secondary | ICD-10-CM | POA: Diagnosis not present

## 2023-10-09 DIAGNOSIS — D72818 Other decreased white blood cell count: Secondary | ICD-10-CM | POA: Diagnosis not present

## 2023-10-09 DIAGNOSIS — Z85828 Personal history of other malignant neoplasm of skin: Secondary | ICD-10-CM | POA: Diagnosis not present

## 2023-10-09 DIAGNOSIS — Z952 Presence of prosthetic heart valve: Secondary | ICD-10-CM | POA: Diagnosis not present

## 2023-10-09 DIAGNOSIS — I10 Essential (primary) hypertension: Secondary | ICD-10-CM | POA: Diagnosis not present

## 2023-10-09 DIAGNOSIS — D849 Immunodeficiency, unspecified: Secondary | ICD-10-CM | POA: Diagnosis not present

## 2023-10-09 DIAGNOSIS — M1990 Unspecified osteoarthritis, unspecified site: Secondary | ICD-10-CM | POA: Diagnosis not present

## 2023-10-09 DIAGNOSIS — G43909 Migraine, unspecified, not intractable, without status migrainosus: Secondary | ICD-10-CM | POA: Diagnosis not present

## 2023-10-09 DIAGNOSIS — K219 Gastro-esophageal reflux disease without esophagitis: Secondary | ICD-10-CM | POA: Diagnosis not present

## 2023-10-09 DIAGNOSIS — C801 Malignant (primary) neoplasm, unspecified: Secondary | ICD-10-CM | POA: Diagnosis not present

## 2023-10-09 DIAGNOSIS — C92A0 Acute myeloid leukemia with multilineage dysplasia, not having achieved remission: Secondary | ICD-10-CM | POA: Diagnosis not present

## 2023-10-09 DIAGNOSIS — G629 Polyneuropathy, unspecified: Secondary | ICD-10-CM | POA: Diagnosis not present

## 2023-10-09 DIAGNOSIS — R001 Bradycardia, unspecified: Secondary | ICD-10-CM | POA: Diagnosis not present

## 2023-10-09 DIAGNOSIS — E440 Moderate protein-calorie malnutrition: Secondary | ICD-10-CM | POA: Diagnosis not present

## 2023-10-09 DIAGNOSIS — D649 Anemia, unspecified: Secondary | ICD-10-CM | POA: Diagnosis not present

## 2023-10-09 DIAGNOSIS — I2584 Coronary atherosclerosis due to calcified coronary lesion: Secondary | ICD-10-CM | POA: Diagnosis not present

## 2023-10-09 DIAGNOSIS — Z79899 Other long term (current) drug therapy: Secondary | ICD-10-CM | POA: Diagnosis not present

## 2023-10-09 DIAGNOSIS — Z888 Allergy status to other drugs, medicaments and biological substances status: Secondary | ICD-10-CM | POA: Diagnosis not present

## 2023-10-09 DIAGNOSIS — Z01818 Encounter for other preprocedural examination: Secondary | ICD-10-CM | POA: Diagnosis not present

## 2023-10-09 DIAGNOSIS — R9431 Abnormal electrocardiogram [ECG] [EKG]: Secondary | ICD-10-CM | POA: Diagnosis not present

## 2023-10-09 DIAGNOSIS — Z87891 Personal history of nicotine dependence: Secondary | ICD-10-CM | POA: Diagnosis not present

## 2023-10-09 DIAGNOSIS — R918 Other nonspecific abnormal finding of lung field: Secondary | ICD-10-CM | POA: Diagnosis not present

## 2023-10-09 DIAGNOSIS — Z87442 Personal history of urinary calculi: Secondary | ICD-10-CM | POA: Diagnosis not present

## 2023-10-10 DIAGNOSIS — C9140 Hairy cell leukemia not having achieved remission: Secondary | ICD-10-CM | POA: Diagnosis not present

## 2023-10-10 DIAGNOSIS — Z7901 Long term (current) use of anticoagulants: Secondary | ICD-10-CM | POA: Diagnosis not present

## 2023-10-10 DIAGNOSIS — D469 Myelodysplastic syndrome, unspecified: Secondary | ICD-10-CM | POA: Diagnosis not present

## 2023-10-10 DIAGNOSIS — D849 Immunodeficiency, unspecified: Secondary | ICD-10-CM | POA: Diagnosis not present

## 2023-10-10 DIAGNOSIS — D630 Anemia in neoplastic disease: Secondary | ICD-10-CM | POA: Diagnosis not present

## 2023-10-10 DIAGNOSIS — D696 Thrombocytopenia, unspecified: Secondary | ICD-10-CM | POA: Diagnosis not present

## 2023-10-10 DIAGNOSIS — Z952 Presence of prosthetic heart valve: Secondary | ICD-10-CM | POA: Diagnosis not present

## 2023-10-10 DIAGNOSIS — C92A1 Acute myeloid leukemia with multilineage dysplasia, in remission: Secondary | ICD-10-CM | POA: Diagnosis not present

## 2023-10-10 DIAGNOSIS — C9200 Acute myeloblastic leukemia, not having achieved remission: Secondary | ICD-10-CM | POA: Diagnosis not present

## 2023-10-11 DIAGNOSIS — C9200 Acute myeloblastic leukemia, not having achieved remission: Secondary | ICD-10-CM | POA: Diagnosis not present

## 2023-10-11 DIAGNOSIS — D696 Thrombocytopenia, unspecified: Secondary | ICD-10-CM | POA: Diagnosis not present

## 2023-10-11 DIAGNOSIS — Z952 Presence of prosthetic heart valve: Secondary | ICD-10-CM | POA: Diagnosis not present

## 2023-10-11 DIAGNOSIS — D630 Anemia in neoplastic disease: Secondary | ICD-10-CM | POA: Diagnosis not present

## 2023-10-11 DIAGNOSIS — C92A1 Acute myeloid leukemia with multilineage dysplasia, in remission: Secondary | ICD-10-CM | POA: Diagnosis not present

## 2023-10-11 DIAGNOSIS — Z7901 Long term (current) use of anticoagulants: Secondary | ICD-10-CM | POA: Diagnosis not present

## 2023-10-11 DIAGNOSIS — D849 Immunodeficiency, unspecified: Secondary | ICD-10-CM | POA: Diagnosis not present

## 2023-10-11 DIAGNOSIS — C9140 Hairy cell leukemia not having achieved remission: Secondary | ICD-10-CM | POA: Diagnosis not present

## 2023-10-12 ENCOUNTER — Telehealth: Payer: Self-pay | Admitting: Hematology & Oncology

## 2023-10-12 NOTE — Telephone Encounter (Signed)
 Hospitalized (Pt currently hospitalized in [place] will call back to r/s once discharged.)

## 2023-10-13 DIAGNOSIS — D469 Myelodysplastic syndrome, unspecified: Secondary | ICD-10-CM | POA: Diagnosis not present

## 2023-10-16 ENCOUNTER — Encounter: Payer: Self-pay | Admitting: Cardiovascular Disease

## 2023-10-19 ENCOUNTER — Other Ambulatory Visit

## 2023-10-19 ENCOUNTER — Ambulatory Visit

## 2023-10-19 ENCOUNTER — Ambulatory Visit: Admitting: Hematology & Oncology

## 2023-10-20 ENCOUNTER — Ambulatory Visit

## 2023-10-21 ENCOUNTER — Ambulatory Visit

## 2023-10-22 ENCOUNTER — Other Ambulatory Visit

## 2023-10-22 ENCOUNTER — Ambulatory Visit

## 2023-10-23 ENCOUNTER — Ambulatory Visit

## 2023-10-27 ENCOUNTER — Other Ambulatory Visit

## 2023-10-27 ENCOUNTER — Ambulatory Visit

## 2023-10-28 ENCOUNTER — Ambulatory Visit

## 2023-11-06 DIAGNOSIS — Z87891 Personal history of nicotine dependence: Secondary | ICD-10-CM | POA: Diagnosis not present

## 2023-11-06 DIAGNOSIS — Z885 Allergy status to narcotic agent status: Secondary | ICD-10-CM | POA: Diagnosis not present

## 2023-11-06 DIAGNOSIS — Z7901 Long term (current) use of anticoagulants: Secondary | ICD-10-CM | POA: Diagnosis not present

## 2023-11-06 DIAGNOSIS — Z85828 Personal history of other malignant neoplasm of skin: Secondary | ICD-10-CM | POA: Diagnosis not present

## 2023-11-06 DIAGNOSIS — D61818 Other pancytopenia: Secondary | ICD-10-CM | POA: Diagnosis not present

## 2023-11-06 DIAGNOSIS — Z79624 Long term (current) use of inhibitors of nucleotide synthesis: Secondary | ICD-10-CM | POA: Diagnosis not present

## 2023-11-06 DIAGNOSIS — Z888 Allergy status to other drugs, medicaments and biological substances status: Secondary | ICD-10-CM | POA: Diagnosis not present

## 2023-11-06 DIAGNOSIS — C9200 Acute myeloblastic leukemia, not having achieved remission: Secondary | ICD-10-CM | POA: Diagnosis not present

## 2023-11-06 DIAGNOSIS — Z9889 Other specified postprocedural states: Secondary | ICD-10-CM | POA: Diagnosis not present

## 2023-11-06 DIAGNOSIS — Z87442 Personal history of urinary calculi: Secondary | ICD-10-CM | POA: Diagnosis not present

## 2023-11-06 DIAGNOSIS — T451X5A Adverse effect of antineoplastic and immunosuppressive drugs, initial encounter: Secondary | ICD-10-CM | POA: Diagnosis not present

## 2023-11-06 DIAGNOSIS — C9140 Hairy cell leukemia not having achieved remission: Secondary | ICD-10-CM | POA: Diagnosis not present

## 2023-11-06 DIAGNOSIS — D469 Myelodysplastic syndrome, unspecified: Secondary | ICD-10-CM | POA: Diagnosis not present

## 2023-11-06 DIAGNOSIS — K219 Gastro-esophageal reflux disease without esophagitis: Secondary | ICD-10-CM | POA: Diagnosis not present

## 2023-11-06 DIAGNOSIS — Z952 Presence of prosthetic heart valve: Secondary | ICD-10-CM | POA: Diagnosis not present

## 2023-11-06 DIAGNOSIS — Z79899 Other long term (current) drug therapy: Secondary | ICD-10-CM | POA: Diagnosis not present

## 2023-11-06 DIAGNOSIS — Z8701 Personal history of pneumonia (recurrent): Secondary | ICD-10-CM | POA: Diagnosis not present

## 2023-11-06 NOTE — Progress Notes (Signed)
 ------------------------------------------------------------------------------- Attestation signed by Laverna Glory Punches, MD at 11/10/2023  9:40 PM I have seen and examined the patient in conjunction with the fellow and have formulated the treatment plan.  Please refer to the associated note for more details.   This was a shared visit and I performed the substantive portion of the visit based on the entirety of MDM.   I have reviewed the relevant documentation in the record. The level of service was chosen based on MDM.      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  He was treated with 4 cycles of 5-azacytidine, had progressive disease in the form of multiple ongoing cytopenias and transfusion dependence.   We saw him in consultation, and a complete workup was done here confirming a diagnosis of progression and hypomethylating agent failure, with elevated blast up to 18%, consistent with diagnosis of MDS/AML.  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   The patient received therapy off protocol with cladribine as little cytarabine plus venetoclax.  He did well in the hospital.  He had minimal complications, was recently discharged, comes today for follow-up visit.  A complete review of system was performed today further documented in the associate note.  The patient feels well overall.  He did well with the chemotherapy, no new complaints.  He has good energy.  Performing status is 1.   A complete physical exam was performed today, further documented in the associate note.  Lungs are clear, heart regular, abdomen benign.  Extremities are warm with no cyanosis clubbing edema.       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  He was receiving single agent Vidaza  at home.  He has received 4 cycles of Vidaza .  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  The patient received chemotherapy protocol with cladribine for 5 days, low-dose cytarabine for 10 days, and venetoclax for 14 days.  He achieved complete morphologic remission, comes today for follow-up with his wife.   1.  MDS/AML.  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  His initial bone marrow showed 18% blasts, mutations in TET2, and U2 AF-1.  Karyotype was diploid.   The patient is currently in complete remission.  Cycle 1 day 28.  His counts have recovered.  Bone marrow shows 4% blast, flow cytometry is positive at 1.36% consistent with residual disease.  Interestingly, his hairy cell leukemia also responded, with a very low level of hairy cell remaining.  Plan now is to continue therapy with consolidation.  He is not a candidate for allogenic stem cell transplantation.  At the opportunity speak with his local doctor, and sent him an email, considering our ongoing approach.  And MD Lenon, we will continue with cycle 2 of cladribine plus low-dose obtainment for venetoclax including 3 to 5 days of cladribine, 10 days of little cytarabine, and 10 days of venetoclax for cycle 2.  This will be followed by 2 cycles of  HMA and venetoclax in an alternating fashion.  Explained this to the patient and physician.  He felt he could receive this at home.  The patient will return home, plan to see his doctor in the next few days, receive therapy there, and plan to see his back in about 5 to 6 weeks for evaluation of tolerance and  toxicity, as well as repeat bone marrow to assess for minimal residual disease.  2.  Cardiac.  The patient has a diagnosis of hypertension, continues on amlodipine , doing well.  He has history of mechanical aortic valve and was on warfarin previously.  In the setting of impending thrombocytopenia with chemotherapy, he is continued on Lovenox , and is doing well overall.  He will continue to follow with his local doctor for this.  The patient had ample opportunity to ask questions today which were answered to their satisfaction. -------------------------------------------------------------------------------  Leukemia Clinic Follow-up Note   Patient: Patrick [name] Age: 83 y.o.  MRN: [id number]  Attending: Laverna Glory Punches, MD   DATE OF Visit: November 06, 2023  REASON FOR VISIT: Follow up for Acute myeloid leukemia with TET2, [id number]  PRIOR THERAPY: Azacitadine 75 mg/m2 IV q day x 7 day C1D1 06/29/23 subq C2D1 07/27/23, changed to IV because he did not like the injections  C3D1 08/24/23 IV C4D1 09/21/23 - last day was yesterday, 8/5  TREATMENT HISTORY: Oncology History  Acute myeloid leukemia without mention of having achieved remission  10/10/2023 -  TX-Chemotherapy/Biotherapy/Investigational/SMI (Autogenerated)    Treatment Plan   LEU IP/OP: Venetoclax with Cladribine and Low-Dose CYTarabine Alternating with Decitabine in Older Patients with AML   Chemotherapy summary: venetoclax (VENCLEXTA) tablet 100 mg, oral, 1 of 1 cycle venetoclax (Venclexta) 100 mg tablet, oral, 1 of 1 cycle, Start date: 10/10/2023, End date: -- cladribine (LEUSTATIN) 9.8 mg in sodium chloride  0.9% (NS) 250 mL IVPB, intravenous, 1 of 10 cycles decitabine (DACOGEN) 39 mg in sodium chloride  0.9% (NS) 50 mL IVPB, intravenous, 0 of 8 cycles CYTarabine (CYTOSAR) syringe prescription (Home Use), subcutaneous, 0 of 9 cycles   Treatment Dates: 10/10/2023 to 01/30/2025 (Planned)  Line of Treatment: Heme: Induction  Therapy   Treatment Goal:    Discontinue Reason: Dwan is still active]        History of Present Illness  Patrick [name] is a 83 y.o. male from [place], [place]  with R/R MDS/AML post HMA failure. His past medical history is significant for HTN, CAD, mAVR (08/01/21, on enoxaparin ).   He was initially seen by hematology in 12/2021 for cytopenias. At the time he had mild cytopenias which were monitored with regular visits. Then on 09/01/22, he underwent a BMBx that showed blast count of 4% on aspirate and 9% by flow cytometry. It also detected a 5% population of cells that expressed CD103 and CD11c. Peripheral blood NGS showed TET2 and U2AF1.   His counts continued to downtrend so he was started on azacitidine  in May 2025. He presented to [facility] for a second opinion on 09/30/2023. 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  Diploid karyotype. NGS with TET2 (VAF 15%) and U2AF1 (VAF<5%).   Interval History:   Patient is here for follow up after being admitted for C1. He did not report any complications with C1 - no infections or fever. He is C1D28 today. He underwent bone marrow biopsy on 11/03/2023 and prelim flow shows 4% blasts. He plans to leave to [place]   tomorrow.      Past Medical History:  Diagnosis Date  . Anemia 2023   Refractory Anemia with Excess RAEB-1  . Arthritis 2019   In hands  . Basal cell carcinoma of skin 2015   Mohs surgery  . Benign prostatic hyperplasia 1970   Had operation (Radiologist)  . Diverticulitis 2015   comes and goes  . Eczema 2015   Under control  . Gallstone 2017   untreated  . Gastric reflux 2019   taking daily med  . Migraine 1973   still have visual phenomena but no pain  . Neuropathy 2022   usually at night  . Pneumonia 1954  . Renal stone 1968   none for the last 30 years  .  Lukasz  has a past medical history of Anemia (2023), Arthritis  (2019), Basal cell carcinoma of skin (2015), Benign prostatic hyperplasia (1970), Diverticulitis (2015), Eczema (2015), Gallstone (2017), Gastric reflux (2019), Migraine (1973), Neuropathy (2022), Pneumonia (1954), and Renal stone (1968).  Review of Systems  Constitutional:  Positive for fatigue. Negative for chills, diaphoresis and fever.  HENT:  Negative for mouth sores and nosebleeds.   Respiratory:  Negative for cough, shortness of breath and wheezing.   Cardiovascular:  Negative for chest pain.  Gastrointestinal:  Negative for abdominal pain, constipation, diarrhea, nausea and vomiting.    Allergies  Allergen Reactions  . Codeine  Other (See Comments) and Rash    ...  . Terbinafine Itching     Nutrition status: Nutrition status: no nutritional compromise at this time ECOG performance status: (1) Restricted in physically strenuous activity, ambulatory and able to do work of light nature.  Need for nutrition consult? YES NO: no  Current Medications  Medication Sig Start Date End Date Taking? Authorizing Provider  amLODIPine  (NORVASC ) 2.5 mg tablet Take 3 tablets (7.5 mg) by mouth daily. 11/05/23  Yes Borthakur, Emaline, MD  atorvastatin  (LIPITOR) 10 mg tablet Take 1 tablet (10 mg) by mouth daily. 06/09/23  Yes HISTORICAL PROVIDER  diphenoxylate -atropine  (LOMOTIL ) 2.5 mg-0.025 mg per tablet Take 1 tablet by mouth every 6 (six) hours as needed for diarrhea. 04/17/23  Yes HISTORICAL PROVIDER  enoxaparin  (LOVENOX ) 120 mg/0.8 mL prefilled syringe Inject 0.8 mL (120 mg) under the skin daily. 06/29/23  Yes HISTORICAL PROVIDER  levoFLOXacin  (LEVAQUIN ) 500 mg tablet Take 1 tablet (500 mg) by mouth daily. 11/05/23  Yes Borthakur, Emaline, MD  ondansetron  (ZOFRAN ) 8 mg tablet Take 1 tablet (8 mg) by mouth every 8 (eight) hours as needed for nausea.  Takes every AM when he gets infusion 06/29/23  Yes HISTORICAL PROVIDER  pantoprazole  (PROTONIX ) 40 mg EC tablet Take 1 tablet (40 mg) by mouth every morning  before breakfast.   Yes HISTORICAL PROVIDER  posaconazole (NoxafiL) 100 mg DR tablet Take 3 tablets (300 mg) by mouth daily. 10/20/23  Yes Bridget Fallow, MD  rOPINIRole  (REQUIP ) 0.25 mg tablet Take 1 tablet (0.25 mg) by mouth 3 (three) times a day. 09/28/23  Yes HISTORICAL PROVIDER  sodium chloride  (NS) 0.9% flush syringe 10 mL Inject 10 mL (1 syringe) into each lumen of central venous catheter daily as directed. 11/05/23  Yes Borthakur, Emaline, MD  SUMAtriptan  (IMITREX ) 25 mg tablet Take 1 tablet (25 mg) by mouth as needed for migraine. 05/17/22  Yes HISTORICAL PROVIDER  valACYclovir  (VALTREX ) 500 mg tablet Take 1 tablet (500 mg) by mouth daily. 10/21/23  Yes Bridget Fallow, MD  tadalafil  (CIALIS ) 5 mg tablet Take 1 tablet (5 mg) by mouth daily  as needed for erectile dysfunction. Patient not taking: Reported on 11/06/2023 09/10/23   HISTORICAL PROVIDER    Family History  Problem Relation Name Age of Onset  . Ovarian cancer Mother Mervyn JULIANNA Clause     No past surgical history on file. He  has no past surgical history on file.  Family History  Problem Relation Name Age of Onset  . Ovarian cancer Mother Mervyn JULIANNA Clause     @SOC1 @   Objective  Vitals:   11/06/23 0659  BP: 138/72  Pulse: 65  Resp: 18  Temp: 36.3 C (97.3 F)  TempSrc: Oral  SpO2: 99%  Weight: 74.9 kg (165 lb 2 oz)   Weight  11/06/23 74.9 kg (165 lb 2 oz)  11/04/23 71.6 kg (157 lb 13.6 oz)  10/09/23 75.1 kg (165 lb 9.1 oz)   Blood Pressure  11/06/23 138/72  11/05/23 136/69  10/09/23 116/66   Heart Rate  11/06/23 65  11/05/23 (!) 58  10/09/23 78   Temperature  11/06/23 36.3 C (97.3 F) (Oral)  11/05/23 36.5 C (97.7 F) (Oral)  10/09/23 36.6 C (97.9 F) (Oral)   Respirations  11/06/23 18  11/05/23 16  10/09/23 18   Wt Readings from Last 3 Encounters:  11/06/23 74.9 kg (165 lb 2 oz)  11/04/23 71.6 kg (157 lb 13.6 oz)  10/09/23 75.1 kg (165 lb 9.1 oz)   ECOG Status: 1 - Symptomatic but completely  ambulatory     Physical Exam Vitals and nursing note reviewed.  Constitutional:      General: He is not in acute distress.    Appearance: He appears well-developed. He is not diaphoretic.  HENT:     Head: Normocephalic and atraumatic.     Nose: Nose normal.     Mouth/Throat:     Pharynx: Uvula midline.     Right Ear: Tympanic membrane and external ear normal.     Left Ear: Tympanic membrane and external ear normal.  Eyes:     General: No scleral icterus.    Conjunctiva/sclera: Conjunctivae normal.     Pupils: Pupils are equal, round, and reactive to light.  Neck:     Thyroid : No thyromegaly.     Vascular: No JVD.  Cardiovascular:     Rate and Rhythm: Normal rate and regular rhythm.     Heart sounds: Murmur heard.     Systolic murmur is present.     No friction rub. No gallop.     Comments: Click from AV valve Pulmonary:     Effort: Pulmonary effort is normal. No accessory muscle usage or respiratory distress.     Breath sounds: Normal breath sounds. No wheezing or rales.  Chest:     Chest wall: No tenderness.  Abdominal:     General: Bowel sounds are normal. There is no distension or abdominal bruit.     Palpations: Abdomen is soft.     Tenderness: There is no abdominal tenderness.     Hernia: No hernia is present.  Musculoskeletal:        General: No tenderness. Normal range of motion.     Cervical back: Normal range of motion.  Lymphadenopathy:     Cervical: No cervical adenopathy.  Skin:    General: Skin is warm.  Neurological:     Mental Status: He is alert and oriented to person, place, and time.     Coordination: Coordination normal.  Psychiatric:        Behavior: Behavior normal. Behavior is  cooperative.        Thought Content: Thought content normal.        Judgment: Judgment normal.   Physical Exam MUSCULOSKELETAL: No spinal tenderness.   LABORATORY DATA/IMAGING/PATHOLOGY: I have reviewed pertinent laboratory results and pertinent pathology  data. Results LABS Hb: 8.2 (10/02/2023) PLT: 32 (10/02/2023)  PATHOLOGY Bone marrow biopsy: 18% to 20% blasts (09/30/2023)  LABORATORY DATA: HEMATOLOGY Recent Labs    Units 11/03/23 0052 11/04/23 0051 11/04/23 2114 11/06/23 0653  White Blood Cell K/uL 1.3* 1.9* 3.0* 4.6  Hemoglobin g/dL 8.7* 9.1* 89.7* 9.8*  Hematocrit % 25.0* 26.6* 29.4* 28.7*  Mean Cell Volume fL 93 94 94 94  Platelet K/uL 109* 132* 160 181  Manual Neutrophil Abs K/uL 0.39* 0.86* 1.44* 2.35   Lab Results  Component Value Date   BLASTS 3.0 (H) 11/04/2023   BLASTS 3.0 (H) 11/04/2023   BLASTS 1.0 (H) 11/03/2023   BLASTS 1.0 (H) 11/02/2023   Results from last 7 days  Lab Units 11/06/23 0653 11/04/23 2114 11/04/23 0051 11/03/23 0052  LYMPHOCYTE ABS K/uL 0.32* 0.24* 0.08* 0.31*  MONOCYTE ABS K/uL 1.75* 1.02* 0.89* 0.52     CHEMISTRY Recent Labs    Units 11/04/23 0051 11/04/23 2114 11/06/23 0653  Glucose Level mg/dL  --   --  877*  Glucose Random mg/dL 874 826  --   Calcium  Level Total mg/dL 9.3 9.4 9.2  Sodium Level mmol/L 139 140 136  Potassium Level mmol/L 3.6 3.3* 3.8  CO2 mmol/L  22 22 24   Chloride mmol/L  105 104 104  BUN mg/dL 13 17 13   Creatinine mg/dL 9.00 8.81* 8.91  eGFR fO/fpw/8.26 sq. m 77 62 69  Magnesium  Level mg/dL 1.9 1.8 2.2  Phosphorus Level mg/dL 2.9 2.9 2.2*    LFTs Recent Labs    Units 11/04/23 0051 11/04/23 2114 11/06/23 0653  Albumin Level gm/dL 3.6 3.8 3.8  ALT U/L 17 19 16   AST U/L 14 19 16   Alkaline Phosphatase U/L 84 99 92  Bilirubin Total mg/dL 0.4 0.7 0.5  LDH U/L 723* 315* 315*  Uric Acid mg/dL 3.0* 3.7 3.6     @LABALLVALUES (TSH:5,T4:5,T3:5)@ @LABALLVALUES (HGBA1)@  COAGS Recent Labs    Units 10/31/23 0036 11/03/23 0052 11/04/23 2114  Prothrombin Time second(s) 14.4 14.6* 15.0*  Activated PTT second(s) 34.7 33.0 36.7*  International Normalization Ratio  1.14* 1.16* 1.20*  D-Dimer mcg/ml FEU 1.67* 1.30* 1.61*  Fibrinogen  mg/dL 601 568 525      Cardiac Enzymes No results for input(s): CK, CKMB, TROPONINT, BNP, NTPROBNP in the last 72 hours.    Inflammatory Markers No results for input(s): PROCALCITO, SEDRATE, CRP, IL6 in the last 72 hours.  Invalid input(s): IL1   Infusions: No current facility-administered medications for this visit.    Meds:   Abx:     Nutrition: Active Orders  There are no active orders of the following types: Diet, Nourishments.        MICROBIOLOGY Cultures: Results     ** No results found for the last 168 hours. **        @MICRORESULTS14D @    IMAGING: Imaging: No results found. CT Chest without Contrast Result Date: 10/10/2023 There are small scattered ground glass opacities in the left lung is uncertain whether these are due to sequelae of prior infection or early acute infection. ACTIONABLE ITEMS/RECOMMENDATIONS*: None. *An Actionable Finding is a finding that may be unrelated to the original reason for imaging but potentially actionable, meaning further investigation may be necessary.  The Actionable Findings Vigilance Unit (AFVU) assists medical providers with responding to additional radiologic findings that are unexpected and potentially actionable. I personally reviewed these image(s) along with the resident's/fellow's interpretations, certify that if a procedure was performed I was physically present, and agree with the final report.  XR Chest 2 View Post Implant Result Date: 10/10/2023 A central venous catheter is in appropriate position. ACTIONABLE ITEMS/RECOMMENDATIONS*: None. *An Actionable Finding is a finding that may be unrelated to the original reason for imaging but potentially actionable, meaning further investigation may be necessary. The Actionable Findings Vigilance Unit (AFVU) assists medical providers with responding to additional radiologic findings that are unexpected and potentially actionable. I personally reviewed these image(s) along with the  resident's/fellow's interpretations, certify that if a procedure was performed I was physically present, and agree with the final report.   Interpretation: I have reviewed the pertinent radiographic imaging.     Assessment & Plan  Assessment & Plan  # MDS/AML progressed from MDS, NGS with TET2 (VAF 15%) and U2AF1 (VAF<5%). 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 He was treated with 4 cycles of 5-azacytidine, had progressive disease. 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 He was treated with Clad/LDAC/Ven off protocol started 10/10/23. He is C1D28 today and prelim bone marrow shows 4% blasts with FC showing 1.36%. He wants to continue the next cycle back in [place] . We spoke to his local Oncologist Dr. Timmy regarding the treatment plan and he is agreeable this. We will see him back on November 5th 2025 with a repeat bone marrow biopsy on Nov 3rd.    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 Received Cladribine for AML therapy and flow cytometry down from 9% to 0.01%.   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 Please see Attending Attestation for changes or additions to assessment and plan.   Hervey Star, MD  Leukemia Fellow  11/06/23

## 2023-11-08 DIAGNOSIS — C9142 Hairy cell leukemia, in relapse: Secondary | ICD-10-CM | POA: Diagnosis not present

## 2023-11-09 ENCOUNTER — Encounter: Payer: Self-pay | Admitting: Hematology & Oncology

## 2023-11-09 ENCOUNTER — Other Ambulatory Visit: Payer: Self-pay

## 2023-11-09 ENCOUNTER — Other Ambulatory Visit: Payer: Self-pay | Admitting: *Deleted

## 2023-11-09 ENCOUNTER — Inpatient Hospital Stay (HOSPITAL_BASED_OUTPATIENT_CLINIC_OR_DEPARTMENT_OTHER): Admitting: Hematology & Oncology

## 2023-11-09 ENCOUNTER — Inpatient Hospital Stay

## 2023-11-09 ENCOUNTER — Inpatient Hospital Stay: Attending: Hematology & Oncology

## 2023-11-09 VITALS — BP 115/60 | HR 66 | Temp 97.9°F | Resp 18 | Ht 71.0 in | Wt 164.0 lb

## 2023-11-09 DIAGNOSIS — D46Z Other myelodysplastic syndromes: Secondary | ICD-10-CM

## 2023-11-09 DIAGNOSIS — Z952 Presence of prosthetic heart valve: Secondary | ICD-10-CM | POA: Insufficient documentation

## 2023-11-09 DIAGNOSIS — C9200 Acute myeloblastic leukemia, not having achieved remission: Secondary | ICD-10-CM | POA: Insufficient documentation

## 2023-11-09 DIAGNOSIS — D708 Other neutropenia: Secondary | ICD-10-CM

## 2023-11-09 DIAGNOSIS — D696 Thrombocytopenia, unspecified: Secondary | ICD-10-CM | POA: Insufficient documentation

## 2023-11-09 DIAGNOSIS — Z79899 Other long term (current) drug therapy: Secondary | ICD-10-CM | POA: Insufficient documentation

## 2023-11-09 DIAGNOSIS — Z5111 Encounter for antineoplastic chemotherapy: Secondary | ICD-10-CM | POA: Diagnosis not present

## 2023-11-09 DIAGNOSIS — D4621 Refractory anemia with excess of blasts 1: Secondary | ICD-10-CM

## 2023-11-09 DIAGNOSIS — D72810 Lymphocytopenia: Secondary | ICD-10-CM

## 2023-11-09 DIAGNOSIS — C9201 Acute myeloblastic leukemia, in remission: Secondary | ICD-10-CM

## 2023-11-09 DIAGNOSIS — D709 Neutropenia, unspecified: Secondary | ICD-10-CM

## 2023-11-09 DIAGNOSIS — Z7902 Long term (current) use of antithrombotics/antiplatelets: Secondary | ICD-10-CM | POA: Insufficient documentation

## 2023-11-09 LAB — CMP (CANCER CENTER ONLY)
ALT: 14 U/L (ref 0–44)
AST: 17 U/L (ref 15–41)
Albumin: 3.8 g/dL (ref 3.5–5.0)
Alkaline Phosphatase: 80 U/L (ref 38–126)
Anion gap: 9 (ref 5–15)
BUN: 13 mg/dL (ref 8–23)
CO2: 25 mmol/L (ref 22–32)
Calcium: 9.4 mg/dL (ref 8.9–10.3)
Chloride: 104 mmol/L (ref 98–111)
Creatinine: 1.02 mg/dL (ref 0.61–1.24)
GFR, Estimated: >60 mL/min (ref >60)
Glucose, Bld: 175 mg/dL — ABNORMAL HIGH (ref 70–99)
Potassium: 3.6 mmol/L (ref 3.5–5.1)
Sodium: 138 mmol/L (ref 135–145)
Total Bilirubin: 0.5 mg/dL (ref 0.0–1.2)
Total Protein: 5.8 g/dL — ABNORMAL LOW (ref 6.5–8.1)

## 2023-11-09 LAB — CBC WITH DIFFERENTIAL (CANCER CENTER ONLY)
Abs Immature Granulocytes: 0.13 K/uL — ABNORMAL HIGH (ref 0.00–0.07)
Basophils Absolute: 0 K/uL (ref 0.0–0.1)
Basophils Relative: 0 %
Eosinophils Absolute: 0 K/uL (ref 0.0–0.5)
Eosinophils Relative: 0 %
HCT: 30.3 % — ABNORMAL LOW (ref 39.0–52.0)
Hemoglobin: 10.3 g/dL — ABNORMAL LOW (ref 13.0–17.0)
Immature Granulocytes: 2 %
Lymphocytes Relative: 3 %
Lymphs Abs: 0.2 K/uL — ABNORMAL LOW (ref 0.7–4.0)
MCH: 32.2 pg (ref 26.0–34.0)
MCHC: 34 g/dL (ref 30.0–36.0)
MCV: 94.7 fL (ref 80.0–100.0)
Monocytes Absolute: 1.8 K/uL — ABNORMAL HIGH (ref 0.1–1.0)
Monocytes Relative: 24 %
Neutro Abs: 5.5 K/uL (ref 1.7–7.7)
Neutrophils Relative %: 71 %
Platelet Count: 226 K/uL (ref 150–400)
RBC: 3.2 MIL/uL — ABNORMAL LOW (ref 4.22–5.81)
RDW: 17.1 % — ABNORMAL HIGH (ref 11.5–15.5)
WBC Count: 7.7 K/uL (ref 4.0–10.5)
nRBC: 0 % (ref 0.0–0.2)

## 2023-11-09 LAB — D-DIMER, QUANTITATIVE: D-Dimer, Quant: 0.93 ug{FEU}/mL — ABNORMAL HIGH (ref 0.00–0.50)

## 2023-11-09 LAB — LACTATE DEHYDROGENASE: LDH: 321 U/L — ABNORMAL HIGH (ref 98–192)

## 2023-11-09 LAB — SAMPLE TO BLOOD BANK

## 2023-11-09 LAB — MAGNESIUM: Magnesium: 2 mg/dL (ref 1.7–2.4)

## 2023-11-09 LAB — PROTIME-INR
INR: 1.1 (ref 0.8–1.2)
Prothrombin Time: 14.9 s (ref 11.4–15.2)

## 2023-11-09 LAB — URIC ACID: Uric Acid, Serum: 3.8 mg/dL (ref 3.7–8.6)

## 2023-11-09 LAB — FIBRINOGEN: Fibrinogen: 549 mg/dL — ABNORMAL HIGH (ref 210–475)

## 2023-11-09 LAB — APTT: aPTT: 34 s (ref 24–36)

## 2023-11-09 LAB — PHOSPHORUS: Phosphorus: 2 mg/dL — ABNORMAL LOW (ref 2.5–4.6)

## 2023-11-10 ENCOUNTER — Encounter: Payer: Self-pay | Admitting: Cardiovascular Disease

## 2023-11-10 ENCOUNTER — Telehealth: Payer: Self-pay

## 2023-11-10 NOTE — Telephone Encounter (Signed)
 My chart message sent

## 2023-11-11 ENCOUNTER — Ambulatory Visit: Admission: EM | Admit: 2023-11-11 | Discharge: 2023-11-11 | Disposition: A | Discharge status: Home / Self Care

## 2023-11-11 ENCOUNTER — Other Ambulatory Visit: Payer: Self-pay | Admitting: *Deleted

## 2023-11-11 ENCOUNTER — Other Ambulatory Visit: Payer: Self-pay

## 2023-11-11 ENCOUNTER — Inpatient Hospital Stay

## 2023-11-11 ENCOUNTER — Telehealth: Payer: Self-pay

## 2023-11-11 ENCOUNTER — Other Ambulatory Visit: Payer: Self-pay | Admitting: Pharmacist

## 2023-11-11 ENCOUNTER — Ambulatory Visit: Payer: Self-pay | Admitting: Hematology & Oncology

## 2023-11-11 ENCOUNTER — Encounter: Payer: Self-pay | Admitting: Hematology & Oncology

## 2023-11-11 DIAGNOSIS — C9201 Acute myeloblastic leukemia, in remission: Secondary | ICD-10-CM

## 2023-11-11 DIAGNOSIS — Z79899 Other long term (current) drug therapy: Secondary | ICD-10-CM | POA: Diagnosis not present

## 2023-11-11 DIAGNOSIS — C9200 Acute myeloblastic leukemia, not having achieved remission: Secondary | ICD-10-CM | POA: Diagnosis not present

## 2023-11-11 DIAGNOSIS — Z5111 Encounter for antineoplastic chemotherapy: Secondary | ICD-10-CM | POA: Diagnosis not present

## 2023-11-11 DIAGNOSIS — R8290 Unspecified abnormal findings in urine: Secondary | ICD-10-CM

## 2023-11-11 DIAGNOSIS — R309 Painful micturition, unspecified: Secondary | ICD-10-CM

## 2023-11-11 DIAGNOSIS — Z7902 Long term (current) use of antithrombotics/antiplatelets: Secondary | ICD-10-CM | POA: Diagnosis not present

## 2023-11-11 DIAGNOSIS — Z952 Presence of prosthetic heart valve: Secondary | ICD-10-CM | POA: Diagnosis not present

## 2023-11-11 DIAGNOSIS — D709 Neutropenia, unspecified: Secondary | ICD-10-CM

## 2023-11-11 LAB — URINALYSIS, COMPLETE (UACMP) WITH MICROSCOPIC
Bilirubin Urine: NEGATIVE
Glucose, UA: NEGATIVE mg/dL
Hgb urine dipstick: NEGATIVE
Ketones, ur: NEGATIVE mg/dL
Leukocytes,Ua: NEGATIVE
Nitrite: NEGATIVE
Protein, ur: NEGATIVE mg/dL
Specific Gravity, Urine: 1.025 (ref 1.005–1.030)
pH: 5.5 (ref 5.0–8.0)

## 2023-11-11 MED ORDER — MYRBETRIQ 25 MG PO TB24: 25.0000 mg | ORAL_TABLET | Freq: Every day | ORAL | 3 refills | Status: DC

## 2023-11-11 NOTE — Progress Notes (Unsigned)
 9 Hematology and Oncology Follow Up Visit  Patrick [name] [id number] 20-Mar-1940 83 y.o. 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  He mated back from [facility]  He went down there for treatment.  He was treated by Patrick [name].  He was placed on a protocol with cladribine/low-dose ARA-C and venetoclax.  He was an inpatient.  I think he was inpatient for about 4 weeks.  He had a follow bone marrow which showed to be in remission.  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  We will try to transition back over to Coumadin  as his blood counts have improved.  He really looks quite good.  He has a good appetite.  He has had no problems with nausea or vomiting.  He has had no rashes.  Has had no fever.  He has had no cough.  He has had no leg swelling.  Again, I really think he has done incredibly well.  I am not surprised.  I think he got wonderful care by Patrick [name] and his leukemia team.  We will treat him in a consolidation/maintenance setting.    Our only problem might be try to figure out how to do the low-dose  ARA-C.  Hopefully, this will be able to be managed.  Hopefully, we will be able to he did get down to [facility].  He had a very good time down there.  He saw Patrick [name] who was incredibly thorough.  Overall, I would have to say that his performance status is probably ECOG 1.   Medications:  Current Outpatient Medications:    amLODipine  (NORVASC ) 2.5 MG tablet, Take 7.5 mg by mouth daily. Do not take if SBP < 110, Disp: , Rfl:    levofloxacin  (LEVAQUIN ) 500 MG tablet, Take 500 mg by mouth daily., Disp: , Rfl:    ondansetron  (ZOFRAN ) 8 MG  tablet, Take 8 mg by mouth every 8 (eight) hours as needed., Disp: , Rfl:    posaconazole (NOXAFIL) 100 MG TBEC delayed-release tablet, Take 300 mg by mouth daily., Disp: , Rfl:    valACYclovir  (VALTREX ) 500 MG tablet, Take 500 mg by mouth daily., Disp: , Rfl:    atorvastatin  (LIPITOR) 10 MG tablet, TAKE ONE TABLET BY MOUTH ONE TIME DAILY, Disp: 90 tablet, Rfl: 2   diphenoxylate -atropine  (LOMOTIL ) 2.5-0.025 MG tablet, Take 1 tablet by mouth 4 (four) times daily as needed for diarrhea or loose stools., Disp: 60 tablet, Rfl: 1   enoxaparin  (LOVENOX ) 120 MG/0.8ML injection, Inject 0.8 mLs (120 mg total) into the skin daily., Disp: 24 mL, Rfl: 12   hydrocortisone 2.5 % cream, Apply 1 Application topically daily as needed., Disp: , Rfl:    MYRBETRIQ  25 MG TB24 tablet, Take 1 tablet (25 mg total) by mouth daily., Disp: 90 tablet, Rfl: 3   pantoprazole  (PROTONIX ) 40 MG tablet, Take 40 mg by mouth daily., Disp: , Rfl:    rOPINIRole  (REQUIP ) 0.25 MG tablet, Take 1 tablet (0.25 mg total) by mouth 3 (three) times daily. Take 1 tablet 1 hour before bedtime.  In 3 days, take 2 tablets 1 hour before bedtime.  In 1  week take 4 tablets 1 hour before bedtime, Disp: 60 tablet, Rfl: 2   SUMAtriptan  (IMITREX ) 25 MG tablet, TAKE ONE TABLET BY MOUTH AT ONSET OF HEADACHE. MAY REPEAT DOSE WITH ONE TABLET IN TWO HOURS IF NEEDED. DO NOT EXCEED TWO TABLETS IN 24 HOURS., Disp: 12 tablet, Rfl: 0   tadalafil  (CIALIS ) 5 MG tablet, Take 1 tablet (5 mg total) by mouth daily., Disp: 90 tablet, Rfl: 3  Allergies:  Allergies  Allergen Reactions   Codeine  Rash, Dermatitis and Other (See Comments)    ...   Terbinafine Itching    Past Medical History, Surgical history, Social history, and Family History were reviewed and updated.  Review of Systems: Review of Systems  Constitutional: Negative.   HENT:  Negative.    Eyes: Negative.   Respiratory: Negative.    Cardiovascular: Negative.   Gastrointestinal: Negative.    Endocrine: Negative.   Genitourinary: Negative.    Musculoskeletal: Negative.   Skin: Negative.   Neurological: Negative.   Hematological: Negative.   Psychiatric/Behavioral: Negative.      Physical Exam:  height is 5' 11 (1.803 m) and weight is 164 lb (74.4 kg). His oral temperature is 97.9 F (36.6 C). His blood pressure is 115/60 and his pulse is 66. His respiration is 18 and oxygen saturation is 99%.   Wt Readings from Last 3 Encounters:  11/09/23 164 lb (74.4 kg)  10/05/23 165 lb 6.4 oz (75 kg)  09/21/23 166 lb 6.4 oz (75.5 kg)    Physical Exam Vitals reviewed.  HENT:     Head: Normocephalic and atraumatic.  Eyes:     Pupils: Pupils are equal, round, and reactive to light.     Comments: Ocular exam does show the conjunctival hemorrhage with the right eye.  Pupil reacts appropriately.  He has good extraocular muscle movement.  Cardiovascular:     Rate and Rhythm: Normal rate and regular rhythm.     Heart sounds: Normal heart sounds.     Comments: Cardiac exam shows a regular rate and rhythm.  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  I hear no murmurs or rubs or bruits. Pulmonary:     Effort: Pulmonary effort is normal.     Breath sounds: Normal breath sounds.  Abdominal:     General: Bowel sounds are normal.     Palpations: Abdomen is soft.  Musculoskeletal:        General: No tenderness or deformity. Normal range of motion.     Cervical back: Normal range of motion.  Lymphadenopathy:     Cervical: No cervical adenopathy.  Skin:    General: Skin is warm and dry.     Findings: No erythema or rash.  Neurological:     Mental Status: He is alert and oriented to person, place, and time.  Psychiatric:        Behavior: Behavior normal.        Thought Content: Thought content normal.        Judgment: Judgment normal.      Lab Results  Component Value Date   WBC 7.7 11/09/2023   HGB 10.3 (L) 11/09/2023   HCT 30.3 (L) 11/09/2023   MCV  94.7 11/09/2023   PLT 226 11/09/2023     Chemistry      Component Value Date/Time   NA 138 11/09/2023 1124   NA 139 11/12/2020 1030   K 3.6 11/09/2023 1124   CL 104 11/09/2023 1124   CO2 25  11/09/2023 1124   BUN 13 11/09/2023 1124   BUN 16 11/12/2020 1030   CREATININE 1.02 11/09/2023 1124      Component Value Date/Time   CALCIUM  9.4 11/09/2023 1124   ALKPHOS 80 11/09/2023 1124   AST 17 11/09/2023 1124   ALT 14 11/09/2023 1124   BILITOT 0.5 11/09/2023 1124       Impression and Plan: Mr. Oguinn is a 83 year old white male.  He has mild leukopenia and thrombocytopenia. I did do a bone marrow biopsy on him.  This confirmed that he has myelodysplasia.  Again, I would classify him as refractory anemia with excess blast-1  (RAEB-1).  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  This was proven by bone marrow biopsy done at [facility].  He underwent protocol treatment down at [facility].  He responded incredibly well.  He is in remission right now.  Now, we will have to see about doing treatment in a consolidation setting.  I spoke with Patrick [name].  He will send me the protocol doses.  We will see about getting treatment started in a week.  We will have to watch his blood counts.  Actually, his CBC looks fantastic.  All of his labs look fantastic.  I am just happy that he was able to go down and get such great care and have such a great result.  He really has been incredibly motivated.  I know that he has had a very active life.  He has been working.  He travels [place] for his job.  His wife is very dedicated to him.  He is dedicated to his wife.  Will see about getting everything started.  Will have him monitor his blood counts closely.     Maude JONELLE Crease, MD 9/17/20251:45 PM

## 2023-11-11 NOTE — Telephone Encounter (Signed)
 Pt called in with c/o burning with urination at tip of penis. Urgency and feeling like they are completely emptying. Pt states no fevers at time and generalized pain. At the time we have no open appointments or same day spots. I instructed pt to seek urgent care and we found an urgent care in his area. Pt stated understanding.

## 2023-11-12 ENCOUNTER — Encounter: Payer: Self-pay | Admitting: Hematology & Oncology

## 2023-11-12 LAB — URINE CULTURE: Culture: NO GROWTH

## 2023-11-13 ENCOUNTER — Encounter: Payer: Self-pay | Admitting: Hematology & Oncology

## 2023-11-13 ENCOUNTER — Encounter: Payer: Self-pay | Admitting: Family Medicine

## 2023-11-13 DIAGNOSIS — C9200 Acute myeloblastic leukemia, not having achieved remission: Secondary | ICD-10-CM | POA: Diagnosis not present

## 2023-11-13 DIAGNOSIS — D46Z Other myelodysplastic syndromes: Secondary | ICD-10-CM | POA: Diagnosis not present

## 2023-11-16 ENCOUNTER — Telehealth: Payer: Self-pay | Admitting: Pharmacist

## 2023-11-16 ENCOUNTER — Inpatient Hospital Stay

## 2023-11-16 ENCOUNTER — Encounter: Payer: Self-pay | Admitting: Hematology & Oncology

## 2023-11-16 ENCOUNTER — Telehealth: Payer: Self-pay

## 2023-11-16 ENCOUNTER — Other Ambulatory Visit (HOSPITAL_COMMUNITY): Payer: Self-pay

## 2023-11-16 ENCOUNTER — Other Ambulatory Visit: Payer: Self-pay

## 2023-11-16 ENCOUNTER — Other Ambulatory Visit: Payer: Self-pay | Admitting: Hematology & Oncology

## 2023-11-16 DIAGNOSIS — C9201 Acute myeloblastic leukemia, in remission: Secondary | ICD-10-CM

## 2023-11-16 DIAGNOSIS — Z952 Presence of prosthetic heart valve: Secondary | ICD-10-CM | POA: Diagnosis not present

## 2023-11-16 DIAGNOSIS — Z79899 Other long term (current) drug therapy: Secondary | ICD-10-CM | POA: Diagnosis not present

## 2023-11-16 DIAGNOSIS — D46Z Other myelodysplastic syndromes: Secondary | ICD-10-CM

## 2023-11-16 DIAGNOSIS — D4621 Refractory anemia with excess of blasts 1: Secondary | ICD-10-CM

## 2023-11-16 DIAGNOSIS — C9200 Acute myeloblastic leukemia, not having achieved remission: Secondary | ICD-10-CM | POA: Diagnosis not present

## 2023-11-16 DIAGNOSIS — D72810 Lymphocytopenia: Secondary | ICD-10-CM

## 2023-11-16 DIAGNOSIS — Z5111 Encounter for antineoplastic chemotherapy: Secondary | ICD-10-CM | POA: Diagnosis not present

## 2023-11-16 DIAGNOSIS — D696 Thrombocytopenia, unspecified: Secondary | ICD-10-CM

## 2023-11-16 DIAGNOSIS — Z7902 Long term (current) use of antithrombotics/antiplatelets: Secondary | ICD-10-CM | POA: Diagnosis not present

## 2023-11-16 DIAGNOSIS — D708 Other neutropenia: Secondary | ICD-10-CM

## 2023-11-16 DIAGNOSIS — D709 Neutropenia, unspecified: Secondary | ICD-10-CM

## 2023-11-16 LAB — PHOSPHORUS: Phosphorus: 3.3 mg/dL (ref 2.5–4.6)

## 2023-11-16 LAB — CBC WITH DIFFERENTIAL (CANCER CENTER ONLY)
Abs Immature Granulocytes: 0.12 K/uL — ABNORMAL HIGH (ref 0.00–0.07)
Basophils Absolute: 0.1 K/uL (ref 0.0–0.1)
Basophils Relative: 1 %
Eosinophils Absolute: 0 K/uL (ref 0.0–0.5)
Eosinophils Relative: 1 %
HCT: 35.3 % — ABNORMAL LOW (ref 39.0–52.0)
Hemoglobin: 11.9 g/dL — ABNORMAL LOW (ref 13.0–17.0)
Immature Granulocytes: 2 %
Lymphocytes Relative: 6 %
Lymphs Abs: 0.4 K/uL — ABNORMAL LOW (ref 0.7–4.0)
MCH: 32.1 pg (ref 26.0–34.0)
MCHC: 33.7 g/dL (ref 30.0–36.0)
MCV: 95.1 fL (ref 80.0–100.0)
Monocytes Absolute: 1.5 K/uL — ABNORMAL HIGH (ref 0.1–1.0)
Monocytes Relative: 21 %
Neutro Abs: 5.2 K/uL (ref 1.7–7.7)
Neutrophils Relative %: 69 %
Platelet Count: 203 K/uL (ref 150–400)
RBC: 3.71 MIL/uL — ABNORMAL LOW (ref 4.22–5.81)
RDW: 18.3 % — ABNORMAL HIGH (ref 11.5–15.5)
WBC Count: 7.3 K/uL (ref 4.0–10.5)
nRBC: 0 % (ref 0.0–0.2)

## 2023-11-16 LAB — CMP (CANCER CENTER ONLY)
ALT: 15 U/L (ref 0–44)
AST: 23 U/L (ref 15–41)
Albumin: 4.3 g/dL (ref 3.5–5.0)
Alkaline Phosphatase: 74 U/L (ref 38–126)
Anion gap: 11 (ref 5–15)
BUN: 23 mg/dL (ref 8–23)
CO2: 24 mmol/L (ref 22–32)
Calcium: 10 mg/dL (ref 8.9–10.3)
Chloride: 103 mmol/L (ref 98–111)
Creatinine: 1.24 mg/dL (ref 0.61–1.24)
GFR, Estimated: 58 mL/min — ABNORMAL LOW (ref >60)
Glucose, Bld: 112 mg/dL — ABNORMAL HIGH (ref 70–99)
Potassium: 4.5 mmol/L (ref 3.5–5.1)
Sodium: 138 mmol/L (ref 135–145)
Total Bilirubin: 0.4 mg/dL (ref 0.0–1.2)
Total Protein: 6.6 g/dL (ref 6.5–8.1)

## 2023-11-16 LAB — SAMPLE TO BLOOD BANK

## 2023-11-16 LAB — MAGNESIUM: Magnesium: 2.4 mg/dL (ref 1.7–2.4)

## 2023-11-16 LAB — PROTIME-INR
INR: 1 (ref 0.8–1.2)
Prothrombin Time: 13.6 s (ref 11.4–15.2)

## 2023-11-16 LAB — FIBRINOGEN: Fibrinogen: 647 mg/dL — ABNORMAL HIGH (ref 210–475)

## 2023-11-16 LAB — LACTATE DEHYDROGENASE: LDH: 313 U/L — ABNORMAL HIGH (ref 98–192)

## 2023-11-16 LAB — APTT: aPTT: 34 s (ref 24–36)

## 2023-11-16 LAB — URIC ACID: Uric Acid, Serum: 4.6 mg/dL (ref 3.7–8.6)

## 2023-11-16 LAB — D-DIMER, QUANTITATIVE: D-Dimer, Quant: 1.03 ug{FEU}/mL — ABNORMAL HIGH (ref 0.00–0.50)

## 2023-11-16 MED ORDER — VENETOCLAX 100 MG PO TABS
400.0000 mg | ORAL_TABLET | Freq: Every day | ORAL | 0 refills | Status: DC
  Filled 2023-11-16 – 2023-11-17 (×3): qty 40, 10d supply, fill #0

## 2023-11-16 MED ORDER — VENETOCLAX 100 MG PO TABS: 400.0000 mg | ORAL_TABLET | Freq: Every day | ORAL | 6 refills | Status: DC

## 2023-11-16 NOTE — Progress Notes (Signed)
 Specialty Pharmacy Initial Fill Coordination Note  Patrick [name] is a 83 y.o. male contacted today regarding refills of specialty medication(s) Venetoclax  (VENCLEXTA ) .  Patient requested Delivery  on 11/18/23  to verified address [address]  [name] [address]   Medication will be filled on 11/17/2023.   Patient is aware of $0.00 copayment with Lorrene mangle

## 2023-11-16 NOTE — Patient Instructions (Signed)
 PICC Home Care Guide A peripherally inserted central catheter (PICC) is a form of IV access that allows medicines and IV fluids to be quickly put into the blood and spread throughout the body. The PICC is a long, thin, flexible tube (catheter) that is put into a vein in a person's arm or leg. The catheter ends in a large vein just outside the heart called the superior vena cava (SVC). After the PICC is put in, a chest X-ray may be done to make sure that it is in the right place. A PICC may be placed for different reasons, such as: To give medicines and liquid nutrition. To give IV fluids and blood products. To take blood samples often. If there is trouble placing a peripheral intravenous (PIV) catheter. If cared for properly, a PICC can remain in place for many months. Having a PICC can allow you to go home from the hospital sooner and continue treatment at home. Medicines and PICC care can be managed at home by a family member, caregiver, or home health care team. What are the risks? Generally, having a PICC is safe. However, problems may occur, including: A blood clot (thrombus) forming in or at the end of the PICC. A blood clot forming in a vein (deep vein thrombosis) or traveling to the lung (pulmonary embolism). Inflammation of the vein (phlebitis) in which the PICC is placed. Infection at the insertion site or in the blood. Blood infections from central lines, like PICCs, can be serious and often require a hospital stay. PICC malposition, or PICC movement or poor placement. A break or cut in the PICC. Do not use scissors near the PICC. Nerve or tendon irritation or injury during PICC insertion. How to care for your PICC Please follow the specific guidelines provided by your health care provider. Preventing infection You and any caregivers should wash your hands often with soap and water for at least 20 seconds. Wash hands: Before touching the PICC or the infusion device. Before changing a  bandage (dressing). Do not change the dressing unless you have been taught to do so and have shown you are able to change it safely. Flush the PICC as told. Tell your health care provider right away if the PICC is hard to flush or does not flush. Do not use force to flush the PICC. Use clean and germ-free (sterile) supplies only. Keep the supplies in a dry place. Do not reuse needles, syringes, or any other supplies. Reusing supplies can lead to infection. Keep the PICC dressing dry and secure it with tape if the edges stop sticking to your skin. Check your PICC insertion site every day for signs of infection. Check for: Redness, swelling, or pain. Fluid or blood. Warmth. Pus or a bad smell. Preventing other problems Do not use a syringe that is less than 10 mL to flush the PICC. Do not have your blood pressure checked on the arm in which the PICC is placed. Do not ever pull or tug on the PICC. Keep it secured to your arm with tape or a stretch wrap when not in use. Do not take the PICC out yourself. Only a trained health care provider should remove the PICC. Keep pets and children away from your PICC. How to care for your PICC dressing Keep your PICC dressing clean and dry to prevent infection. Do not take baths, swim, or use a hot tub until your health care provider approves. Ask your health care provider if you can take  showers. You may only be allowed to take sponge baths. When you are allowed to shower: Ask your health care provider to teach you how to wrap the PICC. Cover the PICC with clear plastic wrap and tape to keep it dry while showering. Follow instructions from your health care provider about how to take care of your insertion site and dressing. Make sure you: Wash your hands with soap and water for at least 20 seconds before and after you change your dressing. If soap and water are not available, use hand sanitizer. Change your dressing only if taught to do so by your health care  provider. Your PICC dressing needs to be changed if it becomes loose or wet. Leave stitches (sutures), skin glue, or adhesive strips in place. These skin closures may need to stay in place for 2 weeks or longer. If adhesive strip edges start to loosen and curl up, you may trim the loose edges. Do not remove adhesive strips completely unless your health care provider tells you to do that. Follow these instructions at home: Disposal of supplies Throw away any syringes in a disposal container that is meant for sharp items (sharps container). You can buy a sharps container from a pharmacy, or you can make one by using an empty, hard plastic bottle with a lid. Place any used dressings or infusion bags into a plastic bag. Throw that bag in the trash. General instructions  Always carry your PICC identification card or wear a medical alert bracelet. Keep the tube clamped at all times, unless it is being used. Always carry a smooth-edge clamp with you to clamp the PICC if it breaks. Do not use scissors or sharp objects near the tube. You may bend your arm and move it freely. If your PICC is near or at the bend of your elbow, avoid activity with repeated motion at the elbow. Avoid lifting heavy objects as told by your health care provider. Keep all follow-up visits. This is important. You will need to have your PICC dressing changed at least once a week. Contact a health care provider if: You have pain in your arm, ear, face, or teeth. You have a fever or chills. You have redness, swelling, or pain around the insertion site. You have fluid or blood coming from the insertion site. Your insertion site feels warm to the touch. You have pus or a bad smell coming from the insertion site. Your skin feels hard and raised around the insertion site. Your PICC dressing has gotten wet or is coming off and you have not been taught how to change it. Get help right away if: You have problems with your PICC, such as  your PICC: Was tugged or pulled and has partially come out. Do not  push the PICC back in. Cannot be flushed, is hard to flush, or leaks around the insertion site when it is flushed. Makes a flushing sound when it is flushed. Appears to have a hole or tear. Is accidentally pulled all the way out. If this happens, cover the insertion site with a gauze dressing. Do not throw the PICC away. Your health care provider will need to check it to be sure the entire catheter came out. You feel your heart racing or skipping beats, or you have chest pain. You have shortness of breath or trouble breathing. You have swelling, redness, warmth, or pain in the arm in which the PICC is placed. You have a red streak going up your arm that  starts under the PICC dressing. These symptoms may be an emergency. Get help right away. Call 911. Do not wait to see if the symptoms will go away. Do not drive yourself to the hospital. Summary A peripherally inserted central catheter (PICC) is a long, thin, flexible tube (catheter) that is put into a vein in the arm or leg. If cared for properly, a PICC can remain in place for many months. Having a PICC can allow you to go home from the hospital sooner and continue treatment at home. The PICC is inserted using a germ-free (sterile) technique by a specially trained health care provider. Only a trained health care provider should remove it. Do not have your blood pressure checked on the arm in which your PICC is placed. Always keep your PICC identification card with you. This information is not intended to replace advice given to you by your health care provider. Make sure you discuss any questions you have with your health care provider. Document Revised: 08/29/2020 Document Reviewed: 08/29/2020 Elsevier Patient Education  2024 [employer].

## 2023-11-16 NOTE — Telephone Encounter (Signed)
 Oral Chemotherapy Pharmacist Encounter  Patient Education I spoke with patient for overview of oral chemotherapy medication: Venclexta  (venetoclax ) for the treatment of MDS/AML in conjunction with cladribine  and cytarabine , planned duration until disease progression or unacceptable drug toxicity.   Treatment goal: Palliative  Counseled patient on administration, dosing, side effects, monitoring, drug-food interactions, safe handling, storage, and disposal.  Patient will take Venclexta  100 mg tablets, 4 tablets (400 mg total) by mouth daily for 10 days for cycle 2.   Patient knows to avoid grapefruit/grapefruit juice, seville oranges, and star fruit while on venetoclax .  Start date of Cycle 2 pending - medication will be delivered to patient's home on 11/18/23  Side effects include but not limited to: decreased blood counts, diarrhea, fatigue, change in electrolytes, TLS .    Reviewed with patient importance of keeping a medication schedule and plan for any missed doses.  After discussion with patient no patient barriers to medication adherence identified.   Distress thermometer flowsheet: Distress thermometer not completed during telephone call as patient has been on previous lines of therapy.   Communication and Learning Assessment Primary learner: Patient Barriers to learning: No barriers Preferred language: English Learning preferences: Listening Reading  Patrick [name] voiced understanding and appreciation. All questions answered. Medication handout provided.  Provided patient with Oral Chemotherapy Navigation Clinic phone number. Patient knows to call the office with questions or concerns.  Patrick [name], PharmD, BCPS, BCOP Hematology/Oncology Clinical Pharmacist [phone] 11/16/2023 10:49 AM

## 2023-11-16 NOTE — Telephone Encounter (Signed)
 Oral Oncology Patient Advocate Encounter   Was successful in securing patient an $83 Aed[employer] from [employer] [facility]) to provide copayment coverage for Venclexta .  This will keep the out of pocket expense at $0.     The billing information is as follows and has been shared with [facility].   Member ID: [id number] Group ID: [id number] RxBin: [id number] Dates of Eligibility: 10/17/23 through 10/15/24  Fund:  Acute myeloid leukemia    Charlott Hamilton,  CPhT-Adv  she/her/hers [facility]  [facility] Specialty Pharmacy Services Pharmacy Technician Patient Advocate Specialist III [facility] Phone: [phone]  Fax: [phone] [email or web address]

## 2023-11-16 NOTE — Telephone Encounter (Signed)
 Oral Oncology Patient Advocate Encounter  After completing a benefits investigation, prior authorization for Venclexta  is not required at this time through [employer].  Patient's copay is $872.23.      Charlott Hamilton,  CPhT-Adv  she/her/hers [facility]  [facility] Specialty Pharmacy Services Pharmacy Technician Patient Advocate Specialist III [facility] Phone: [phone]  Fax: [phone] [email or web address]

## 2023-11-16 NOTE — Telephone Encounter (Signed)
 Oral Oncology Pharmacist Encounter  Received new prescription for Venclexta  (venetoclax ) for the treatment of MDS/AML in conjunction with cladribine  and cytarabine , planned duration until disease progression or unacceptable drug toxicity.  Patient starting cycle 2 of venetoclax /cladribine  and cytarabine .  CBC w/ Diff, CMP, uric acid and LDH from 11/16/23 assessed, no baseline dose adjustments required at this time. Venetoclax  dosing per [facility] recommendations. OK per Dr. Timmy to dispense 10 day Venclexta  Rx based on 11/06/23 note from [facility] (quantity updated). Prescription dose and frequency assessed for appropriateness.  Current medication list in Epic reviewed, no relevant/significant DDIs with Venclexta  identified. Patient is not currently on posaconazole  per telephone encounter on 11/10/23 as counts have improved.  Evaluated chart and no patient barriers to medication adherence noted.   Patient agreement for treatment documented in MD note on 11/09/23.  Prescription has been e-scribed to the [facility] for benefits analysis and approval.  Oral Oncology Clinic will continue to follow for insurance authorization, copayment issues, initial counseling and start date.  Asberry Macintosh, PharmD, BCPS, BCOP Hematology/Oncology Clinical Pharmacist [phone] 11/16/2023 9:51 AM

## 2023-11-16 NOTE — Progress Notes (Signed)
 Oral Chemotherapy Pharmacist Encounter  Patient was counseled under telephone encounter from 11/16/23.  Patrick [name], PharmD, BCPS, BCOP Hematology/Oncology Clinical Pharmacist [facility] Oral Chemotherapy Navigation Clinics [phone] 11/16/2023 11:08 AM

## 2023-11-17 ENCOUNTER — Other Ambulatory Visit (HOSPITAL_COMMUNITY): Payer: Self-pay

## 2023-11-17 ENCOUNTER — Other Ambulatory Visit: Payer: Self-pay | Admitting: *Deleted

## 2023-11-17 ENCOUNTER — Inpatient Hospital Stay

## 2023-11-17 ENCOUNTER — Encounter: Payer: Self-pay | Admitting: Hematology & Oncology

## 2023-11-17 ENCOUNTER — Other Ambulatory Visit: Payer: Self-pay

## 2023-11-17 MED ORDER — PROCHLORPERAZINE MALEATE 10 MG PO TABS: 10.0000 mg | ORAL_TABLET | Freq: Four times a day (QID) | ORAL | 2 refills | Status: AC | PRN

## 2023-11-17 MED ORDER — ONDANSETRON HCL 8 MG PO TABS: 8.0000 mg | ORAL_TABLET | Freq: Three times a day (TID) | ORAL | 2 refills | Status: DC | PRN

## 2023-11-17 NOTE — Telephone Encounter (Signed)
 Patient Navigation    Flowsheet Row Telephone from 11/17/2023 in [facility]  Visit Information   Purpose of Encounter Clinical Review, Support Shared Decision Making, Next Treatment Milestone  Encounter Conducted With Patient  Patient Journey Phase Treatment  Clinical Review   Initial Patient Intake   Barriers to Care and Social Determinants of Health Assessment   Time Navigated Today (Min) 20  Complexity of Care/Duration of Encounter Moderate: Greater than 11 minutes, less than 45 minutes  Total Time Navigated (Min) 210    [name] contacted by his treatment nurse navigator because he had questions about his next cycle that he will be getting locally. I notified his primary team and all his questions were answered to his satisfaction.

## 2023-11-18 ENCOUNTER — Inpatient Hospital Stay

## 2023-11-18 ENCOUNTER — Other Ambulatory Visit: Payer: Self-pay | Admitting: *Deleted

## 2023-11-18 VITALS — BP 148/73 | HR 53 | Temp 97.8°F | Resp 18

## 2023-11-18 DIAGNOSIS — D4621 Refractory anemia with excess of blasts 1: Secondary | ICD-10-CM

## 2023-11-18 DIAGNOSIS — Z952 Presence of prosthetic heart valve: Secondary | ICD-10-CM | POA: Diagnosis not present

## 2023-11-18 DIAGNOSIS — D46Z Other myelodysplastic syndromes: Secondary | ICD-10-CM

## 2023-11-18 DIAGNOSIS — C9201 Acute myeloblastic leukemia, in remission: Secondary | ICD-10-CM

## 2023-11-18 DIAGNOSIS — Z79899 Other long term (current) drug therapy: Secondary | ICD-10-CM | POA: Diagnosis not present

## 2023-11-18 DIAGNOSIS — Z7902 Long term (current) use of antithrombotics/antiplatelets: Secondary | ICD-10-CM | POA: Diagnosis not present

## 2023-11-18 DIAGNOSIS — C9200 Acute myeloblastic leukemia, not having achieved remission: Secondary | ICD-10-CM | POA: Diagnosis not present

## 2023-11-18 DIAGNOSIS — Z5111 Encounter for antineoplastic chemotherapy: Secondary | ICD-10-CM | POA: Diagnosis not present

## 2023-11-18 MED ORDER — LEVOFLOXACIN 500 MG PO TABS: 500.0000 mg | ORAL_TABLET | Freq: Every day | ORAL | 0 refills | Status: DC

## 2023-11-18 MED ORDER — SODIUM CHLORIDE 0.9 % IV SOLN: INTRAVENOUS | Status: DC

## 2023-11-18 MED ORDER — CYTARABINE CHEMO INJECTION (PF) 20 MG/ML
20.0000 mg | Freq: Once | INTRAMUSCULAR | Status: AC
  Administered 2023-11-18: 20 mg via SUBCUTANEOUS
  Filled 2023-11-18: qty 1

## 2023-11-18 MED ORDER — SODIUM CHLORIDE 0.9 % IV SOLN
5.0000 mg/m2 | Freq: Once | INTRAVENOUS | Status: AC
  Administered 2023-11-18: 10 mg via INTRAVENOUS
  Filled 2023-11-18: qty 10

## 2023-11-18 MED ORDER — PROCHLORPERAZINE MALEATE 10 MG PO TABS: 10.0000 mg | ORAL_TABLET | Freq: Once | ORAL | Status: DC

## 2023-11-18 MED ORDER — POSACONAZOLE 100 MG PO TBEC: 300.0000 mg | DELAYED_RELEASE_TABLET | Freq: Every day | ORAL | 0 refills | Status: DC

## 2023-11-18 NOTE — Patient Instructions (Signed)
 [facility] - A DEPT OF [place]  Discharge Instructions: Thank you for choosing [facility] to provide your oncology and hematology care.   If you have a lab appointment with the Cancer Center, please go directly to the Cancer Center and check in at the registration area.  Wear comfortable clothing and clothing appropriate for easy access to any Portacath or PICC line.   We strive to give you quality time with your provider. You may need to reschedule your appointment if you arrive late (15 or more minutes).  Arriving late affects you and other patients whose appointments are after yours.  Also, if you miss three or more appointments without notifying the office, you may be dismissed from the clinic at the provider's discretion.      For prescription refill requests, have your pharmacy contact our office and allow 72 hours for refills to be completed.    Today you received the following chemotherapy and/or immunotherapy agents Cytarabine .   To help prevent nausea and vomiting after your treatment, we encourage you to take your nausea medication as directed.  BELOW ARE SYMPTOMS THAT SHOULD BE REPORTED IMMEDIATELY: *FEVER GREATER THAN 100.4 F (38 C) OR HIGHER *CHILLS OR SWEATING *NAUSEA AND VOMITING THAT IS NOT CONTROLLED WITH YOUR NAUSEA MEDICATION *UNUSUAL SHORTNESS OF BREATH *UNUSUAL BRUISING OR BLEEDING *URINARY PROBLEMS (pain or burning when urinating, or frequent urination) *BOWEL PROBLEMS (unusual diarrhea, constipation, pain near the anus) TENDERNESS IN MOUTH AND THROAT WITH OR WITHOUT PRESENCE OF ULCERS (sore throat, sores in mouth, or a toothache) UNUSUAL RASH, SWELLING OR PAIN  UNUSUAL VAGINAL DISCHARGE OR ITCHING   Items with * indicate a potential emergency and should be followed up as soon as possible or go to the Emergency Department if any problems should occur.  Please show the CHEMOTHERAPY ALERT CARD or IMMUNOTHERAPY  ALERT CARD at check-in to the Emergency Department and triage nurse. Should you have questions after your visit or need to cancel or reschedule your appointment, please contact [facility] - A DEPT OF [facility]  [phone] and follow the prompts.  Office hours are 8:00 a.m. to 4:30 p.m. Monday - Friday. Please note that voicemails left after 4:00 p.m. may not be returned until the following business day.  We are closed weekends and major holidays. You have access to a nurse at all times for urgent questions. Please call the main number to the clinic [phone] and follow the prompts.  For any non-urgent questions, you may also contact your provider using MyChart. We now offer e-Visits for anyone 12 and older to request care online for non-urgent symptoms. For details visit [email or web address].   Also download the MyChart app! Go to the app store, search MyChart, open the app, select [place], and log in with your MyChart username and password.

## 2023-11-18 NOTE — Patient Instructions (Addendum)
 [facility] - A DEPT OF [place]  Discharge Instructions: Thank you for choosing [facility] to provide your oncology and hematology care.   If you have a lab appointment with the Cancer Center, please go directly to the Cancer Center and check in at the registration area.  Wear comfortable clothing and clothing appropriate for easy access to any Portacath or PICC line.   We strive to give you quality time with your provider. You may need to reschedule your appointment if you arrive late (15 or more minutes).  Arriving late affects you and other patients whose appointments are after yours.  Also, if you miss three or more appointments without notifying the office, you may be dismissed from the clinic at the provider's discretion.      For prescription refill requests, have your pharmacy contact our office and allow 72 hours for refills to be completed.    Today you received the following chemotherapy and/or immunotherapy agents Cladribine , Cytarabine .      To help prevent nausea and vomiting after your treatment, we encourage you to take your nausea medication as directed.  BELOW ARE SYMPTOMS THAT SHOULD BE REPORTED IMMEDIATELY: *FEVER GREATER THAN 100.4 F (38 C) OR HIGHER *CHILLS OR SWEATING *NAUSEA AND VOMITING THAT IS NOT CONTROLLED WITH YOUR NAUSEA MEDICATION *UNUSUAL SHORTNESS OF BREATH *UNUSUAL BRUISING OR BLEEDING *URINARY PROBLEMS (pain or burning when urinating, or frequent urination) *BOWEL PROBLEMS (unusual diarrhea, constipation, pain near the anus) TENDERNESS IN MOUTH AND THROAT WITH OR WITHOUT PRESENCE OF ULCERS (sore throat, sores in mouth, or a toothache) UNUSUAL RASH, SWELLING OR PAIN  UNUSUAL VAGINAL DISCHARGE OR ITCHING   Items with * indicate a potential emergency and should be followed up as soon as possible or go to the Emergency Department if any problems should occur.  Please show the CHEMOTHERAPY ALERT CARD or  IMMUNOTHERAPY ALERT CARD at check-in to the Emergency Department and triage nurse. Should you have questions after your visit or need to cancel or reschedule your appointment, please contact [facility] - A DEPT OF [facility]  [phone] and follow the prompts.  Office hours are 8:00 a.m. to 4:30 p.m. Monday - Friday. Please note that voicemails left after 4:00 p.m. may not be returned until the following business day.  We are closed weekends and major holidays. You have access to a nurse at all times for urgent questions. Please call the main number to the clinic [phone] and follow the prompts.  For any non-urgent questions, you may also contact your provider using MyChart. We now offer e-Visits for anyone 19 and older to request care online for non-urgent symptoms. For details visit [email or web address].   Also download the MyChart app! Go to the app store, search MyChart, open the app, select [place], and log in with your MyChart username and password.

## 2023-11-19 ENCOUNTER — Inpatient Hospital Stay

## 2023-11-19 ENCOUNTER — Encounter: Payer: Self-pay | Admitting: Hematology & Oncology

## 2023-11-19 ENCOUNTER — Telehealth: Payer: Self-pay | Admitting: *Deleted

## 2023-11-19 VITALS — BP 145/71 | HR 57 | Resp 19

## 2023-11-19 DIAGNOSIS — Z7902 Long term (current) use of antithrombotics/antiplatelets: Secondary | ICD-10-CM | POA: Diagnosis not present

## 2023-11-19 DIAGNOSIS — D46Z Other myelodysplastic syndromes: Secondary | ICD-10-CM

## 2023-11-19 DIAGNOSIS — D696 Thrombocytopenia, unspecified: Secondary | ICD-10-CM

## 2023-11-19 DIAGNOSIS — C9201 Acute myeloblastic leukemia, in remission: Secondary | ICD-10-CM

## 2023-11-19 DIAGNOSIS — D709 Neutropenia, unspecified: Secondary | ICD-10-CM

## 2023-11-19 DIAGNOSIS — D708 Other neutropenia: Secondary | ICD-10-CM

## 2023-11-19 DIAGNOSIS — D72810 Lymphocytopenia: Secondary | ICD-10-CM

## 2023-11-19 DIAGNOSIS — C9200 Acute myeloblastic leukemia, not having achieved remission: Secondary | ICD-10-CM | POA: Diagnosis not present

## 2023-11-19 DIAGNOSIS — Z79899 Other long term (current) drug therapy: Secondary | ICD-10-CM | POA: Diagnosis not present

## 2023-11-19 DIAGNOSIS — D4621 Refractory anemia with excess of blasts 1: Secondary | ICD-10-CM

## 2023-11-19 DIAGNOSIS — Z5111 Encounter for antineoplastic chemotherapy: Secondary | ICD-10-CM | POA: Diagnosis not present

## 2023-11-19 DIAGNOSIS — Z952 Presence of prosthetic heart valve: Secondary | ICD-10-CM | POA: Diagnosis not present

## 2023-11-19 LAB — CBC WITH DIFFERENTIAL (CANCER CENTER ONLY)
Abs Immature Granulocytes: 0.04 K/uL (ref 0.00–0.07)
Basophils Absolute: 0 K/uL (ref 0.0–0.1)
Basophils Relative: 0 %
Eosinophils Absolute: 0.1 K/uL (ref 0.0–0.5)
Eosinophils Relative: 1 %
HCT: 35.1 % — ABNORMAL LOW (ref 39.0–52.0)
Hemoglobin: 11.5 g/dL — ABNORMAL LOW (ref 13.0–17.0)
Immature Granulocytes: 1 %
Lymphocytes Relative: 2 %
Lymphs Abs: 0.1 K/uL — ABNORMAL LOW (ref 0.7–4.0)
MCH: 32.1 pg (ref 26.0–34.0)
MCHC: 32.8 g/dL (ref 30.0–36.0)
MCV: 98 fL (ref 80.0–100.0)
Monocytes Absolute: 0.9 K/uL (ref 0.1–1.0)
Monocytes Relative: 11 %
Neutro Abs: 6.9 K/uL (ref 1.7–7.7)
Neutrophils Relative %: 85 %
Platelet Count: 157 K/uL (ref 150–400)
RBC: 3.58 MIL/uL — ABNORMAL LOW (ref 4.22–5.81)
RDW: 18.5 % — ABNORMAL HIGH (ref 11.5–15.5)
WBC Count: 8 K/uL (ref 4.0–10.5)
nRBC: 0 % (ref 0.0–0.2)

## 2023-11-19 LAB — CMP (CANCER CENTER ONLY)
ALT: 20 U/L (ref 0–44)
AST: 21 U/L (ref 15–41)
Albumin: 3.9 g/dL (ref 3.5–5.0)
Alkaline Phosphatase: 66 U/L (ref 38–126)
Anion gap: 10 (ref 5–15)
BUN: 17 mg/dL (ref 8–23)
CO2: 23 mmol/L (ref 22–32)
Calcium: 9.4 mg/dL (ref 8.9–10.3)
Chloride: 104 mmol/L (ref 98–111)
Creatinine: 1.02 mg/dL (ref 0.61–1.24)
GFR, Estimated: >60 mL/min (ref >60)
Glucose, Bld: 157 mg/dL — ABNORMAL HIGH (ref 70–99)
Potassium: 4.1 mmol/L (ref 3.5–5.1)
Sodium: 138 mmol/L (ref 135–145)
Total Bilirubin: 0.4 mg/dL (ref 0.0–1.2)
Total Protein: 6.1 g/dL — ABNORMAL LOW (ref 6.5–8.1)

## 2023-11-19 LAB — MAGNESIUM: Magnesium: 2.1 mg/dL (ref 1.7–2.4)

## 2023-11-19 MED ORDER — SODIUM CHLORIDE 0.9 % IV SOLN: Freq: Once | INTRAVENOUS | Status: AC

## 2023-11-19 MED ORDER — PROCHLORPERAZINE MALEATE 10 MG PO TABS: 10.0000 mg | ORAL_TABLET | Freq: Once | ORAL | Status: DC

## 2023-11-19 MED ORDER — CYTARABINE CHEMO INJECTION (PF) 20 MG/ML
20.0000 mg | Freq: Once | INTRAMUSCULAR | Status: AC
  Administered 2023-11-19: 20 mg via SUBCUTANEOUS
  Filled 2023-11-19: qty 1

## 2023-11-19 MED ORDER — SODIUM CHLORIDE 0.9 % IV SOLN
5.0000 mg/m2 | Freq: Once | INTRAVENOUS | Status: AC
  Administered 2023-11-19: 10 mg via INTRAVENOUS
  Filled 2023-11-19: qty 10

## 2023-11-19 NOTE — Patient Instructions (Signed)
 [facility] - A DEPT OF [place]  Discharge Instructions: Thank you for choosing [facility] to provide your oncology and hematology care.   If you have a lab appointment with the Cancer Center, please go directly to the Cancer Center and check in at the registration area.  Wear comfortable clothing and clothing appropriate for easy access to any Portacath or PICC line.   We strive to give you quality time with your provider. You may need to reschedule your appointment if you arrive late (15 or more minutes).  Arriving late affects you and other patients whose appointments are after yours.  Also, if you miss three or more appointments without notifying the office, you may be dismissed from the clinic at the provider's discretion.      For prescription refill requests, have your pharmacy contact our office and allow 72 hours for refills to be completed.    Today you received the following chemotherapy and/or immunotherapy agents ara-c, leustatin       To help prevent nausea and vomiting after your treatment, we encourage you to take your nausea medication as directed.  BELOW ARE SYMPTOMS THAT SHOULD BE REPORTED IMMEDIATELY: *FEVER GREATER THAN 100.4 F (38 C) OR HIGHER *CHILLS OR SWEATING *NAUSEA AND VOMITING THAT IS NOT CONTROLLED WITH YOUR NAUSEA MEDICATION *UNUSUAL SHORTNESS OF BREATH *UNUSUAL BRUISING OR BLEEDING *URINARY PROBLEMS (pain or burning when urinating, or frequent urination) *BOWEL PROBLEMS (unusual diarrhea, constipation, pain near the anus) TENDERNESS IN MOUTH AND THROAT WITH OR WITHOUT PRESENCE OF ULCERS (sore throat, sores in mouth, or a toothache) UNUSUAL RASH, SWELLING OR PAIN  UNUSUAL VAGINAL DISCHARGE OR ITCHING   Items with * indicate a potential emergency and should be followed up as soon as possible or go to the Emergency Department if any problems should occur.  Please show the CHEMOTHERAPY ALERT CARD or  IMMUNOTHERAPY ALERT CARD at check-in to the Emergency Department and triage nurse. Should you have questions after your visit or need to cancel or reschedule your appointment, please contact [facility] - A DEPT OF [facility]  [phone] and follow the prompts.  Office hours are 8:00 a.m. to 4:30 p.m. Monday - Friday. Please note that voicemails left after 4:00 p.m. may not be returned until the following business day.  We are closed weekends and major holidays. You have access to a nurse at all times for urgent questions. Please call the main number to the clinic [phone] and follow the prompts.  For any non-urgent questions, you may also contact your provider using MyChart. We now offer e-Visits for anyone 12 and older to request care online for non-urgent symptoms. For details visit [email or web address].   Also download the MyChart app! Go to the app store, search MyChart, open the app, select [place], and log in with your MyChart username and password.

## 2023-11-19 NOTE — Patient Instructions (Signed)
 [facility] - A DEPT OF [place]  Discharge Instructions: Thank you for choosing [facility] to provide your oncology and hematology care.   If you have a lab appointment with the Cancer Center, please go directly to the Cancer Center and check in at the registration area.  Wear comfortable clothing and clothing appropriate for easy access to any Portacath or PICC line.   We strive to give you quality time with your provider. You may need to reschedule your appointment if you arrive late (15 or more minutes).  Arriving late affects you and other patients whose appointments are after yours.  Also, if you miss three or more appointments without notifying the office, you may be dismissed from the clinic at the provider's discretion.      For prescription refill requests, have your pharmacy contact our office and allow 72 hours for refills to be completed.    Today you received the following chemotherapy and/or immunotherapy agents cytarabine      To help prevent nausea and vomiting after your treatment, we encourage you to take your nausea medication as directed.  BELOW ARE SYMPTOMS THAT SHOULD BE REPORTED IMMEDIATELY: *FEVER GREATER THAN 100.4 F (38 C) OR HIGHER *CHILLS OR SWEATING *NAUSEA AND VOMITING THAT IS NOT CONTROLLED WITH YOUR NAUSEA MEDICATION *UNUSUAL SHORTNESS OF BREATH *UNUSUAL BRUISING OR BLEEDING *URINARY PROBLEMS (pain or burning when urinating, or frequent urination) *BOWEL PROBLEMS (unusual diarrhea, constipation, pain near the anus) TENDERNESS IN MOUTH AND THROAT WITH OR WITHOUT PRESENCE OF ULCERS (sore throat, sores in mouth, or a toothache) UNUSUAL RASH, SWELLING OR PAIN  UNUSUAL VAGINAL DISCHARGE OR ITCHING   Items with * indicate a potential emergency and should be followed up as soon as possible or go to the Emergency Department if any problems should occur.  Please show the CHEMOTHERAPY ALERT CARD or IMMUNOTHERAPY  ALERT CARD at check-in to the Emergency Department and triage nurse. Should you have questions after your visit or need to cancel or reschedule your appointment, please contact [facility] - A DEPT OF [facility]  [phone] and follow the prompts.  Office hours are 8:00 a.m. to 4:30 p.m. Monday - Friday. Please note that voicemails left after 4:00 p.m. may not be returned until the following business day.  We are closed weekends and major holidays. You have access to a nurse at all times for urgent questions. Please call the main number to the clinic [phone] and follow the prompts.  For any non-urgent questions, you may also contact your provider using MyChart. We now offer e-Visits for anyone 18 and older to request care online for non-urgent symptoms. For details visit [email or web address].   Also download the MyChart app! Go to the app store, search MyChart, open the app, select [place], and log in with your MyChart username and password.

## 2023-11-19 NOTE — Patient Instructions (Signed)
 PICC Home Care Guide A peripherally inserted central catheter (PICC) is a form of IV access that allows medicines and IV fluids to be quickly put into the blood and spread throughout the body. The PICC is a long, thin, flexible tube (catheter) that is put into a vein in a person's arm or leg. The catheter ends in a large vein just outside the heart called the superior vena cava (SVC). After the PICC is put in, a chest X-ray may be done to make sure that it is in the right place. A PICC may be placed for different reasons, such as: To give medicines and liquid nutrition. To give IV fluids and blood products. To take blood samples often. If there is trouble placing a peripheral intravenous (PIV) catheter. If cared for properly, a PICC can remain in place for many months. Having a PICC can allow you to go home from the hospital sooner and continue treatment at home. Medicines and PICC care can be managed at home by a family member, caregiver, or home health care team. What are the risks? Generally, having a PICC is safe. However, problems may occur, including: A blood clot (thrombus) forming in or at the end of the PICC. A blood clot forming in a vein (deep vein thrombosis) or traveling to the lung (pulmonary embolism). Inflammation of the vein (phlebitis) in which the PICC is placed. Infection at the insertion site or in the blood. Blood infections from central lines, like PICCs, can be serious and often require a hospital stay. PICC malposition, or PICC movement or poor placement. A break or cut in the PICC. Do not use scissors near the PICC. Nerve or tendon irritation or injury during PICC insertion. How to care for your PICC Please follow the specific guidelines provided by your health care provider. Preventing infection You and any caregivers should wash your hands often with soap and water for at least 20 seconds. Wash hands: Before touching the PICC or the infusion device. Before changing a  bandage (dressing). Do not change the dressing unless you have been taught to do so and have shown you are able to change it safely. Flush the PICC as told. Tell your health care provider right away if the PICC is hard to flush or does not flush. Do not use force to flush the PICC. Use clean and germ-free (sterile) supplies only. Keep the supplies in a dry place. Do not reuse needles, syringes, or any other supplies. Reusing supplies can lead to infection. Keep the PICC dressing dry and secure it with tape if the edges stop sticking to your skin. Check your PICC insertion site every day for signs of infection. Check for: Redness, swelling, or pain. Fluid or blood. Warmth. Pus or a bad smell. Preventing other problems Do not use a syringe that is less than 10 mL to flush the PICC. Do not have your blood pressure checked on the arm in which the PICC is placed. Do not ever pull or tug on the PICC. Keep it secured to your arm with tape or a stretch wrap when not in use. Do not take the PICC out yourself. Only a trained health care provider should remove the PICC. Keep pets and children away from your PICC. How to care for your PICC dressing Keep your PICC dressing clean and dry to prevent infection. Do not take baths, swim, or use a hot tub until your health care provider approves. Ask your health care provider if you can take  showers. You may only be allowed to take sponge baths. When you are allowed to shower: Ask your health care provider to teach you how to wrap the PICC. Cover the PICC with clear plastic wrap and tape to keep it dry while showering. Follow instructions from your health care provider about how to take care of your insertion site and dressing. Make sure you: Wash your hands with soap and water for at least 20 seconds before and after you change your dressing. If soap and water are not available, use hand sanitizer. Change your dressing only if taught to do so by your health care  provider. Your PICC dressing needs to be changed if it becomes loose or wet. Leave stitches (sutures), skin glue, or adhesive strips in place. These skin closures may need to stay in place for 2 weeks or longer. If adhesive strip edges start to loosen and curl up, you may trim the loose edges. Do not remove adhesive strips completely unless your health care provider tells you to do that. Follow these instructions at home: Disposal of supplies Throw away any syringes in a disposal container that is meant for sharp items (sharps container). You can buy a sharps container from a pharmacy, or you can make one by using an empty, hard plastic bottle with a lid. Place any used dressings or infusion bags into a plastic bag. Throw that bag in the trash. General instructions  Always carry your PICC identification card or wear a medical alert bracelet. Keep the tube clamped at all times, unless it is being used. Always carry a smooth-edge clamp with you to clamp the PICC if it breaks. Do not use scissors or sharp objects near the tube. You may bend your arm and move it freely. If your PICC is near or at the bend of your elbow, avoid activity with repeated motion at the elbow. Avoid lifting heavy objects as told by your health care provider. Keep all follow-up visits. This is important. You will need to have your PICC dressing changed at least once a week. Contact a health care provider if: You have pain in your arm, ear, face, or teeth. You have a fever or chills. You have redness, swelling, or pain around the insertion site. You have fluid or blood coming from the insertion site. Your insertion site feels warm to the touch. You have pus or a bad smell coming from the insertion site. Your skin feels hard and raised around the insertion site. Your PICC dressing has gotten wet or is coming off and you have not been taught how to change it. Get help right away if: You have problems with your PICC, such as  your PICC: Was tugged or pulled and has partially come out. Do not  push the PICC back in. Cannot be flushed, is hard to flush, or leaks around the insertion site when it is flushed. Makes a flushing sound when it is flushed. Appears to have a hole or tear. Is accidentally pulled all the way out. If this happens, cover the insertion site with a gauze dressing. Do not throw the PICC away. Your health care provider will need to check it to be sure the entire catheter came out. You feel your heart racing or skipping beats, or you have chest pain. You have shortness of breath or trouble breathing. You have swelling, redness, warmth, or pain in the arm in which the PICC is placed. You have a red streak going up your arm that  starts under the PICC dressing. These symptoms may be an emergency. Get help right away. Call 911. Do not wait to see if the symptoms will go away. Do not drive yourself to the hospital. Summary A peripherally inserted central catheter (PICC) is a long, thin, flexible tube (catheter) that is put into a vein in the arm or leg. If cared for properly, a PICC can remain in place for many months. Having a PICC can allow you to go home from the hospital sooner and continue treatment at home. The PICC is inserted using a germ-free (sterile) technique by a specially trained health care provider. Only a trained health care provider should remove it. Do not have your blood pressure checked on the arm in which your PICC is placed. Always keep your PICC identification card with you. This information is not intended to replace advice given to you by your health care provider. Make sure you discuss any questions you have with your health care provider. Document Revised: 08/29/2020 Document Reviewed: 08/29/2020 Elsevier Patient Education  2024 [employer].

## 2023-11-19 NOTE — Telephone Encounter (Signed)
 Per Dr Timmy, please do [facility] labs weekly.  LABS include, Total Protein, Albumin, Calcium , Phosphorus, Glucose, BUN, Creatinine, Uric Acid, Total Bili, Alk Phos, LDH, ALT, AST, Sodium, Potassium, Chlorida, CO2, Magnesium , CBC with diff. T&S, D dimer, Fibrinogen , Pt/INR and PTT.  Labs are listed in [employer] at desk.  Other days, do CBC diff, Chem, Mag

## 2023-11-20 ENCOUNTER — Inpatient Hospital Stay

## 2023-11-20 ENCOUNTER — Other Ambulatory Visit: Payer: Self-pay

## 2023-11-20 VITALS — BP 150/62 | HR 54 | Temp 97.7°F

## 2023-11-20 DIAGNOSIS — C9200 Acute myeloblastic leukemia, not having achieved remission: Secondary | ICD-10-CM | POA: Diagnosis not present

## 2023-11-20 DIAGNOSIS — Z952 Presence of prosthetic heart valve: Secondary | ICD-10-CM | POA: Diagnosis not present

## 2023-11-20 DIAGNOSIS — C9201 Acute myeloblastic leukemia, in remission: Secondary | ICD-10-CM

## 2023-11-20 DIAGNOSIS — Z5111 Encounter for antineoplastic chemotherapy: Secondary | ICD-10-CM | POA: Diagnosis not present

## 2023-11-20 DIAGNOSIS — Z79899 Other long term (current) drug therapy: Secondary | ICD-10-CM | POA: Diagnosis not present

## 2023-11-20 DIAGNOSIS — Z7902 Long term (current) use of antithrombotics/antiplatelets: Secondary | ICD-10-CM | POA: Diagnosis not present

## 2023-11-20 DIAGNOSIS — D4621 Refractory anemia with excess of blasts 1: Secondary | ICD-10-CM

## 2023-11-20 DIAGNOSIS — D46Z Other myelodysplastic syndromes: Secondary | ICD-10-CM

## 2023-11-20 MED ORDER — CYTARABINE CHEMO INJECTION (PF) 20 MG/ML
20.0000 mg | Freq: Once | INTRAMUSCULAR | Status: AC
  Administered 2023-11-20: 20 mg via SUBCUTANEOUS
  Filled 2023-11-20: qty 1

## 2023-11-20 MED ORDER — SODIUM CHLORIDE 0.9 % IV SOLN
5.0000 mg/m2 | Freq: Once | INTRAVENOUS | Status: AC
  Administered 2023-11-20: 10 mg via INTRAVENOUS
  Filled 2023-11-20: qty 10

## 2023-11-20 MED ORDER — PROCHLORPERAZINE MALEATE 10 MG PO TABS: 10.0000 mg | ORAL_TABLET | Freq: Once | ORAL | Status: DC

## 2023-11-20 MED ORDER — SODIUM CHLORIDE 0.9% FLUSH: 10.0000 mL | INTRAVENOUS | Status: DC | PRN

## 2023-11-20 MED ORDER — SODIUM CHLORIDE 0.9 % IV SOLN: Freq: Once | INTRAVENOUS | Status: AC

## 2023-11-20 NOTE — Patient Instructions (Signed)
 [facility] - A DEPT OF [place]  Discharge Instructions: Thank you for choosing [facility] to provide your oncology and hematology care.   If you have a lab appointment with the Cancer Center, please go directly to the Cancer Center and check in at the registration area.  Wear comfortable clothing and clothing appropriate for easy access to any Portacath or PICC line.   We strive to give you quality time with your provider. You may need to reschedule your appointment if you arrive late (15 or more minutes).  Arriving late affects you and other patients whose appointments are after yours.  Also, if you miss three or more appointments without notifying the office, you may be dismissed from the clinic at the provider's discretion.      For prescription refill requests, have your pharmacy contact our office and allow 72 hours for refills to be completed.    Today you received the following chemotherapy and/or immunotherapy agents Cytarabune, Cladribine .      To help prevent nausea and vomiting after your treatment, we encourage you to take your nausea medication as directed.  BELOW ARE SYMPTOMS THAT SHOULD BE REPORTED IMMEDIATELY: *FEVER GREATER THAN 100.4 F (38 C) OR HIGHER *CHILLS OR SWEATING *NAUSEA AND VOMITING THAT IS NOT CONTROLLED WITH YOUR NAUSEA MEDICATION *UNUSUAL SHORTNESS OF BREATH *UNUSUAL BRUISING OR BLEEDING *URINARY PROBLEMS (pain or burning when urinating, or frequent urination) *BOWEL PROBLEMS (unusual diarrhea, constipation, pain near the anus) TENDERNESS IN MOUTH AND THROAT WITH OR WITHOUT PRESENCE OF ULCERS (sore throat, sores in mouth, or a toothache) UNUSUAL RASH, SWELLING OR PAIN  UNUSUAL VAGINAL DISCHARGE OR ITCHING   Items with * indicate a potential emergency and should be followed up as soon as possible or go to the Emergency Department if any problems should occur.  Please show the CHEMOTHERAPY ALERT CARD or  IMMUNOTHERAPY ALERT CARD at check-in to the Emergency Department and triage nurse. Should you have questions after your visit or need to cancel or reschedule your appointment, please contact [facility] - A DEPT OF [facility]  [phone] and follow the prompts.  Office hours are 8:00 a.m. to 4:30 p.m. Monday - Friday. Please note that voicemails left after 4:00 p.m. may not be returned until the following business day.  We are closed weekends and major holidays. You have access to a nurse at all times for urgent questions. Please call the main number to the clinic [phone] and follow the prompts.  For any non-urgent questions, you may also contact your provider using MyChart. We now offer e-Visits for anyone 93 and older to request care online for non-urgent symptoms. For details visit [email or web address].   Also download the MyChart app! Go to the app store, search MyChart, open the app, select [place], and log in with your MyChart username and password.

## 2023-11-20 NOTE — Patient Instructions (Signed)
 [facility] - A DEPT OF [place]  Discharge Instructions: Thank you for choosing [facility] to provide your oncology and hematology care.   If you have a lab appointment with the Cancer Center, please go directly to the Cancer Center and check in at the registration area.  Wear comfortable clothing and clothing appropriate for easy access to any Portacath or PICC line.   We strive to give you quality time with your provider. You may need to reschedule your appointment if you arrive late (15 or more minutes).  Arriving late affects you and other patients whose appointments are after yours.  Also, if you miss three or more appointments without notifying the office, you may be dismissed from the clinic at the provider's discretion.      For prescription refill requests, have your pharmacy contact our office and allow 72 hours for refills to be completed.    Today you received the following chemotherapy and/or immunotherapy agents Cytarabine .   To help prevent nausea and vomiting after your treatment, we encourage you to take your nausea medication as directed.  BELOW ARE SYMPTOMS THAT SHOULD BE REPORTED IMMEDIATELY: *FEVER GREATER THAN 100.4 F (38 C) OR HIGHER *CHILLS OR SWEATING *NAUSEA AND VOMITING THAT IS NOT CONTROLLED WITH YOUR NAUSEA MEDICATION *UNUSUAL SHORTNESS OF BREATH *UNUSUAL BRUISING OR BLEEDING *URINARY PROBLEMS (pain or burning when urinating, or frequent urination) *BOWEL PROBLEMS (unusual diarrhea, constipation, pain near the anus) TENDERNESS IN MOUTH AND THROAT WITH OR WITHOUT PRESENCE OF ULCERS (sore throat, sores in mouth, or a toothache) UNUSUAL RASH, SWELLING OR PAIN  UNUSUAL VAGINAL DISCHARGE OR ITCHING   Items with * indicate a potential emergency and should be followed up as soon as possible or go to the Emergency Department if any problems should occur.  Please show the CHEMOTHERAPY ALERT CARD or IMMUNOTHERAPY  ALERT CARD at check-in to the Emergency Department and triage nurse. Should you have questions after your visit or need to cancel or reschedule your appointment, please contact [facility] - A DEPT OF [facility]  [phone] and follow the prompts.  Office hours are 8:00 a.m. to 4:30 p.m. Monday - Friday. Please note that voicemails left after 4:00 p.m. may not be returned until the following business day.  We are closed weekends and major holidays. You have access to a nurse at all times for urgent questions. Please call the main number to the clinic [phone] and follow the prompts.  For any non-urgent questions, you may also contact your provider using MyChart. We now offer e-Visits for anyone 12 and older to request care online for non-urgent symptoms. For details visit [email or web address].   Also download the MyChart app! Go to the app store, search MyChart, open the app, select [place], and log in with your MyChart username and password.

## 2023-11-20 NOTE — Telephone Encounter (Signed)
 [facility] calling for venclexta  50 mg clarification on cycle days, lower than normal dose.  Ph # [phone]

## 2023-11-21 ENCOUNTER — Other Ambulatory Visit: Payer: Self-pay | Admitting: Cardiovascular Disease

## 2023-11-23 ENCOUNTER — Inpatient Hospital Stay

## 2023-11-23 DIAGNOSIS — C9200 Acute myeloblastic leukemia, not having achieved remission: Secondary | ICD-10-CM | POA: Diagnosis not present

## 2023-11-23 DIAGNOSIS — D4621 Refractory anemia with excess of blasts 1: Secondary | ICD-10-CM

## 2023-11-23 DIAGNOSIS — D696 Thrombocytopenia, unspecified: Secondary | ICD-10-CM

## 2023-11-23 DIAGNOSIS — D709 Neutropenia, unspecified: Secondary | ICD-10-CM

## 2023-11-23 DIAGNOSIS — Z7902 Long term (current) use of antithrombotics/antiplatelets: Secondary | ICD-10-CM | POA: Diagnosis not present

## 2023-11-23 DIAGNOSIS — C9201 Acute myeloblastic leukemia, in remission: Secondary | ICD-10-CM

## 2023-11-23 DIAGNOSIS — Z79899 Other long term (current) drug therapy: Secondary | ICD-10-CM | POA: Diagnosis not present

## 2023-11-23 DIAGNOSIS — Z5111 Encounter for antineoplastic chemotherapy: Secondary | ICD-10-CM | POA: Diagnosis not present

## 2023-11-23 DIAGNOSIS — D46Z Other myelodysplastic syndromes: Secondary | ICD-10-CM

## 2023-11-23 DIAGNOSIS — D72810 Lymphocytopenia: Secondary | ICD-10-CM

## 2023-11-23 DIAGNOSIS — D708 Other neutropenia: Secondary | ICD-10-CM

## 2023-11-23 DIAGNOSIS — Z952 Presence of prosthetic heart valve: Secondary | ICD-10-CM | POA: Diagnosis not present

## 2023-11-23 LAB — CMP (CANCER CENTER ONLY)
ALT: 18 U/L (ref 0–44)
AST: 20 U/L (ref 15–41)
Albumin: 4.2 g/dL (ref 3.5–5.0)
Alkaline Phosphatase: 65 U/L (ref 38–126)
Anion gap: 8 (ref 5–15)
BUN: 17 mg/dL (ref 8–23)
CO2: 28 mmol/L (ref 22–32)
Calcium: 10.1 mg/dL (ref 8.9–10.3)
Chloride: 104 mmol/L (ref 98–111)
Creatinine: 1.15 mg/dL (ref 0.61–1.24)
GFR, Estimated: >60 mL/min (ref >60)
Glucose, Bld: 113 mg/dL — ABNORMAL HIGH (ref 70–99)
Potassium: 4.6 mmol/L (ref 3.5–5.1)
Sodium: 139 mmol/L (ref 135–145)
Total Bilirubin: 1 mg/dL (ref 0.0–1.2)
Total Protein: 6.5 g/dL (ref 6.5–8.1)

## 2023-11-23 LAB — CBC WITH DIFFERENTIAL (CANCER CENTER ONLY)
Abs Immature Granulocytes: 0.01 K/uL (ref 0.00–0.07)
Basophils Absolute: 0 K/uL (ref 0.0–0.1)
Basophils Relative: 0 %
Eosinophils Absolute: 0.1 K/uL (ref 0.0–0.5)
Eosinophils Relative: 2 %
HCT: 35.3 % — ABNORMAL LOW (ref 39.0–52.0)
Hemoglobin: 11.9 g/dL — ABNORMAL LOW (ref 13.0–17.0)
Immature Granulocytes: 0 %
Lymphocytes Relative: 2 %
Lymphs Abs: 0.1 K/uL — ABNORMAL LOW (ref 0.7–4.0)
MCH: 32.2 pg (ref 26.0–34.0)
MCHC: 33.7 g/dL (ref 30.0–36.0)
MCV: 95.7 fL (ref 80.0–100.0)
Monocytes Absolute: 0 K/uL — ABNORMAL LOW (ref 0.1–1.0)
Monocytes Relative: 0 %
Neutro Abs: 5.3 K/uL (ref 1.7–7.7)
Neutrophils Relative %: 96 %
Platelet Count: 147 K/uL — ABNORMAL LOW (ref 150–400)
RBC: 3.69 MIL/uL — ABNORMAL LOW (ref 4.22–5.81)
RDW: 17.2 % — ABNORMAL HIGH (ref 11.5–15.5)
WBC Count: 5.6 K/uL (ref 4.0–10.5)
nRBC: 0.4 % — ABNORMAL HIGH (ref 0.0–0.2)

## 2023-11-23 LAB — FIBRINOGEN: Fibrinogen: 471 mg/dL (ref 210–475)

## 2023-11-23 LAB — LACTATE DEHYDROGENASE: LDH: 307 U/L — ABNORMAL HIGH (ref 98–192)

## 2023-11-23 LAB — SAMPLE TO BLOOD BANK

## 2023-11-23 LAB — APTT: aPTT: 33 s (ref 24–36)

## 2023-11-23 LAB — PROTIME-INR
INR: 1.1 (ref 0.8–1.2)
Prothrombin Time: 14.6 s (ref 11.4–15.2)

## 2023-11-23 LAB — URIC ACID: Uric Acid, Serum: 4.6 mg/dL (ref 3.7–8.6)

## 2023-11-23 LAB — PHOSPHORUS: Phosphorus: 3 mg/dL (ref 2.5–4.6)

## 2023-11-23 LAB — D-DIMER, QUANTITATIVE: D-Dimer, Quant: 1.03 ug{FEU}/mL — ABNORMAL HIGH (ref 0.00–0.50)

## 2023-11-23 LAB — MAGNESIUM: Magnesium: 2.1 mg/dL (ref 1.7–2.4)

## 2023-11-23 MED ORDER — CYTARABINE CHEMO INJECTION (PF) 20 MG/ML
20.0000 mg | Freq: Once | INTRAMUSCULAR | Status: AC
  Administered 2023-11-23: 20 mg via SUBCUTANEOUS
  Filled 2023-11-23: qty 1

## 2023-11-23 MED ORDER — PROCHLORPERAZINE MALEATE 10 MG PO TABS: 10.0000 mg | ORAL_TABLET | Freq: Once | ORAL | Status: AC

## 2023-11-23 NOTE — Progress Notes (Unsigned)
 Pt. Arrived for chemo injection/will return this afternoon for 2nd injection.

## 2023-11-23 NOTE — Patient Instructions (Signed)
 [facility] - A DEPT OF [place]  Discharge Instructions: Thank you for choosing [facility] to provide your oncology and hematology care.   If you have a lab appointment with the Cancer Center, please go directly to the Cancer Center and check in at the registration area.  Wear comfortable clothing and clothing appropriate for easy access to any Portacath or PICC line.   We strive to give you quality time with your provider. You may need to reschedule your appointment if you arrive late (15 or more minutes).  Arriving late affects you and other patients whose appointments are after yours.  Also, if you miss three or more appointments without notifying the office, you may be dismissed from the clinic at the provider's discretion.      For prescription refill requests, have your pharmacy contact our office and allow 72 hours for refills to be completed.    Today you received the following chemotherapy and/or immunotherapy agents CYTARABINE        To help prevent nausea and vomiting after your treatment, we encourage you to take your nausea medication as directed.  BELOW ARE SYMPTOMS THAT SHOULD BE REPORTED IMMEDIATELY: *FEVER GREATER THAN 100.4 F (38 C) OR HIGHER *CHILLS OR SWEATING *NAUSEA AND VOMITING THAT IS NOT CONTROLLED WITH YOUR NAUSEA MEDICATION *UNUSUAL SHORTNESS OF BREATH *UNUSUAL BRUISING OR BLEEDING *URINARY PROBLEMS (pain or burning when urinating, or frequent urination) *BOWEL PROBLEMS (unusual diarrhea, constipation, pain near the anus) TENDERNESS IN MOUTH AND THROAT WITH OR WITHOUT PRESENCE OF ULCERS (sore throat, sores in mouth, or a toothache) UNUSUAL RASH, SWELLING OR PAIN  UNUSUAL VAGINAL DISCHARGE OR ITCHING   Items with * indicate a potential emergency and should be followed up as soon as possible or go to the Emergency Department if any problems should occur.  Please show the CHEMOTHERAPY ALERT CARD or IMMUNOTHERAPY  ALERT CARD at check-in to the Emergency Department and triage nurse. Should you have questions after your visit or need to cancel or reschedule your appointment, please contact [facility] - A DEPT OF [facility]  [phone] and follow the prompts.  Office hours are 8:00 a.m. to 4:30 p.m. Monday - Friday. Please note that voicemails left after 4:00 p.m. may not be returned until the following business day.  We are closed weekends and major holidays. You have access to a nurse at all times for urgent questions. Please call the main number to the clinic [phone] and follow the prompts.  For any non-urgent questions, you may also contact your provider using MyChart. We now offer e-Visits for anyone 68 and older to request care online for non-urgent symptoms. For details visit [email or web address].   Also download the MyChart app! Go to the app store, search MyChart, open the app, select [place], and log in with your MyChart username and password.

## 2023-11-23 NOTE — Patient Instructions (Signed)
 PICC Home Care Guide A peripherally inserted central catheter (PICC) is a form of IV access that allows medicines and IV fluids to be quickly put into the blood and spread throughout the body. The PICC is a long, thin, flexible tube (catheter) that is put into a vein in a person's arm or leg. The catheter ends in a large vein just outside the heart called the superior vena cava (SVC). After the PICC is put in, a chest X-ray may be done to make sure that it is in the right place. A PICC may be placed for different reasons, such as: To give medicines and liquid nutrition. To give IV fluids and blood products. To take blood samples often. If there is trouble placing a peripheral intravenous (PIV) catheter. If cared for properly, a PICC can remain in place for many months. Having a PICC can allow you to go home from the hospital sooner and continue treatment at home. Medicines and PICC care can be managed at home by a family member, caregiver, or home health care team. What are the risks? Generally, having a PICC is safe. However, problems may occur, including: A blood clot (thrombus) forming in or at the end of the PICC. A blood clot forming in a vein (deep vein thrombosis) or traveling to the lung (pulmonary embolism). Inflammation of the vein (phlebitis) in which the PICC is placed. Infection at the insertion site or in the blood. Blood infections from central lines, like PICCs, can be serious and often require a hospital stay. PICC malposition, or PICC movement or poor placement. A break or cut in the PICC. Do not use scissors near the PICC. Nerve or tendon irritation or injury during PICC insertion. How to care for your PICC Please follow the specific guidelines provided by your health care provider. Preventing infection You and any caregivers should wash your hands often with soap and water for at least 20 seconds. Wash hands: Before touching the PICC or the infusion device. Before changing a  bandage (dressing). Do not change the dressing unless you have been taught to do so and have shown you are able to change it safely. Flush the PICC as told. Tell your health care provider right away if the PICC is hard to flush or does not flush. Do not use force to flush the PICC. Use clean and germ-free (sterile) supplies only. Keep the supplies in a dry place. Do not reuse needles, syringes, or any other supplies. Reusing supplies can lead to infection. Keep the PICC dressing dry and secure it with tape if the edges stop sticking to your skin. Check your PICC insertion site every day for signs of infection. Check for: Redness, swelling, or pain. Fluid or blood. Warmth. Pus or a bad smell. Preventing other problems Do not use a syringe that is less than 10 mL to flush the PICC. Do not have your blood pressure checked on the arm in which the PICC is placed. Do not ever pull or tug on the PICC. Keep it secured to your arm with tape or a stretch wrap when not in use. Do not take the PICC out yourself. Only a trained health care provider should remove the PICC. Keep pets and children away from your PICC. How to care for your PICC dressing Keep your PICC dressing clean and dry to prevent infection. Do not take baths, swim, or use a hot tub until your health care provider approves. Ask your health care provider if you can take  showers. You may only be allowed to take sponge baths. When you are allowed to shower: Ask your health care provider to teach you how to wrap the PICC. Cover the PICC with clear plastic wrap and tape to keep it dry while showering. Follow instructions from your health care provider about how to take care of your insertion site and dressing. Make sure you: Wash your hands with soap and water for at least 20 seconds before and after you change your dressing. If soap and water are not available, use hand sanitizer. Change your dressing only if taught to do so by your health care  provider. Your PICC dressing needs to be changed if it becomes loose or wet. Leave stitches (sutures), skin glue, or adhesive strips in place. These skin closures may need to stay in place for 2 weeks or longer. If adhesive strip edges start to loosen and curl up, you may trim the loose edges. Do not remove adhesive strips completely unless your health care provider tells you to do that. Follow these instructions at home: Disposal of supplies Throw away any syringes in a disposal container that is meant for sharp items (sharps container). You can buy a sharps container from a pharmacy, or you can make one by using an empty, hard plastic bottle with a lid. Place any used dressings or infusion bags into a plastic bag. Throw that bag in the trash. General instructions  Always carry your PICC identification card or wear a medical alert bracelet. Keep the tube clamped at all times, unless it is being used. Always carry a smooth-edge clamp with you to clamp the PICC if it breaks. Do not use scissors or sharp objects near the tube. You may bend your arm and move it freely. If your PICC is near or at the bend of your elbow, avoid activity with repeated motion at the elbow. Avoid lifting heavy objects as told by your health care provider. Keep all follow-up visits. This is important. You will need to have your PICC dressing changed at least once a week. Contact a health care provider if: You have pain in your arm, ear, face, or teeth. You have a fever or chills. You have redness, swelling, or pain around the insertion site. You have fluid or blood coming from the insertion site. Your insertion site feels warm to the touch. You have pus or a bad smell coming from the insertion site. Your skin feels hard and raised around the insertion site. Your PICC dressing has gotten wet or is coming off and you have not been taught how to change it. Get help right away if: You have problems with your PICC, such as  your PICC: Was tugged or pulled and has partially come out. Do not  push the PICC back in. Cannot be flushed, is hard to flush, or leaks around the insertion site when it is flushed. Makes a flushing sound when it is flushed. Appears to have a hole or tear. Is accidentally pulled all the way out. If this happens, cover the insertion site with a gauze dressing. Do not throw the PICC away. Your health care provider will need to check it to be sure the entire catheter came out. You feel your heart racing or skipping beats, or you have chest pain. You have shortness of breath or trouble breathing. You have swelling, redness, warmth, or pain in the arm in which the PICC is placed. You have a red streak going up your arm that  starts under the PICC dressing. These symptoms may be an emergency. Get help right away. Call 911. Do not wait to see if the symptoms will go away. Do not drive yourself to the hospital. Summary A peripherally inserted central catheter (PICC) is a long, thin, flexible tube (catheter) that is put into a vein in the arm or leg. If cared for properly, a PICC can remain in place for many months. Having a PICC can allow you to go home from the hospital sooner and continue treatment at home. The PICC is inserted using a germ-free (sterile) technique by a specially trained health care provider. Only a trained health care provider should remove it. Do not have your blood pressure checked on the arm in which your PICC is placed. Always keep your PICC identification card with you. This information is not intended to replace advice given to you by your health care provider. Make sure you discuss any questions you have with your health care provider. Document Revised: 08/29/2020 Document Reviewed: 08/29/2020 Elsevier Patient Education  2024 [employer].

## 2023-11-24 ENCOUNTER — Inpatient Hospital Stay

## 2023-11-24 VITALS — BP 141/78 | HR 56 | Temp 97.7°F | Resp 18

## 2023-11-24 DIAGNOSIS — C9200 Acute myeloblastic leukemia, not having achieved remission: Secondary | ICD-10-CM | POA: Diagnosis not present

## 2023-11-24 DIAGNOSIS — D4621 Refractory anemia with excess of blasts 1: Secondary | ICD-10-CM

## 2023-11-24 DIAGNOSIS — Z79899 Other long term (current) drug therapy: Secondary | ICD-10-CM | POA: Diagnosis not present

## 2023-11-24 DIAGNOSIS — Z952 Presence of prosthetic heart valve: Secondary | ICD-10-CM | POA: Diagnosis not present

## 2023-11-24 DIAGNOSIS — Z5111 Encounter for antineoplastic chemotherapy: Secondary | ICD-10-CM | POA: Diagnosis not present

## 2023-11-24 DIAGNOSIS — C9201 Acute myeloblastic leukemia, in remission: Secondary | ICD-10-CM

## 2023-11-24 DIAGNOSIS — D46Z Other myelodysplastic syndromes: Secondary | ICD-10-CM

## 2023-11-24 DIAGNOSIS — Z7902 Long term (current) use of antithrombotics/antiplatelets: Secondary | ICD-10-CM | POA: Diagnosis not present

## 2023-11-24 MED ORDER — PROCHLORPERAZINE MALEATE 10 MG PO TABS: 10.0000 mg | ORAL_TABLET | Freq: Once | ORAL | Status: DC

## 2023-11-24 MED ORDER — CYTARABINE CHEMO INJECTION (PF) 20 MG/ML
20.0000 mg | Freq: Once | INTRAMUSCULAR | Status: AC
  Administered 2023-11-24: 20 mg via SUBCUTANEOUS
  Filled 2023-11-24: qty 1

## 2023-11-24 MED ORDER — AMLODIPINE BESYLATE 2.5 MG PO TABS: 7.5000 mg | ORAL_TABLET | Freq: Every day | ORAL | 2 refills | Status: DC

## 2023-11-24 NOTE — Telephone Encounter (Signed)
 Call back from [facility] rec'd and I provided clarification to the pharmacist that the standard cycle length for Venetoclax  is a total of 28 days, but it can vary based on count recovery and MD discretion.  Pharmacist verbalized understanding and confirmed that's all she needed.

## 2023-11-24 NOTE — Telephone Encounter (Signed)
 Returned call to [facility] pharmacy to provide details regarding Venetoclax  cycle length.  Informed them that a cycle was approximately 28 days/per prescriber's discretion.

## 2023-11-24 NOTE — Patient Instructions (Signed)
 [facility] - A DEPT OF [place]  Discharge Instructions: Thank you for choosing [facility] to provide your oncology and hematology care.   If you have a lab appointment with the Cancer Center, please go directly to the Cancer Center and check in at the registration area.  Wear comfortable clothing and clothing appropriate for easy access to any Portacath or PICC line.   We strive to give you quality time with your provider. You may need to reschedule your appointment if you arrive late (15 or more minutes).  Arriving late affects you and other patients whose appointments are after yours.  Also, if you miss three or more appointments without notifying the office, you may be dismissed from the clinic at the provider's discretion.      For prescription refill requests, have your pharmacy contact our office and allow 72 hours for refills to be completed.    Today you received the following chemotherapy and/or immunotherapy agents:  Ara-C      To help prevent nausea and vomiting after your treatment, we encourage you to take your nausea medication as directed.  BELOW ARE SYMPTOMS THAT SHOULD BE REPORTED IMMEDIATELY: *FEVER GREATER THAN 100.4 F (38 C) OR HIGHER *CHILLS OR SWEATING *NAUSEA AND VOMITING THAT IS NOT CONTROLLED WITH YOUR NAUSEA MEDICATION *UNUSUAL SHORTNESS OF BREATH *UNUSUAL BRUISING OR BLEEDING *URINARY PROBLEMS (pain or burning when urinating, or frequent urination) *BOWEL PROBLEMS (unusual diarrhea, constipation, pain near the anus) TENDERNESS IN MOUTH AND THROAT WITH OR WITHOUT PRESENCE OF ULCERS (sore throat, sores in mouth, or a toothache) UNUSUAL RASH, SWELLING OR PAIN  UNUSUAL VAGINAL DISCHARGE OR ITCHING   Items with * indicate a potential emergency and should be followed up as soon as possible or go to the Emergency Department if any problems should occur.  Please show the CHEMOTHERAPY ALERT CARD or IMMUNOTHERAPY  ALERT CARD at check-in to the Emergency Department and triage nurse. Should you have questions after your visit or need to cancel or reschedule your appointment, please contact [facility] - A DEPT OF [facility]  [phone] and follow the prompts.  Office hours are 8:00 a.m. to 4:30 p.m. Monday - Friday. Please note that voicemails left after 4:00 p.m. may not be returned until the following business day.  We are closed weekends and major holidays. You have access to a nurse at all times for urgent questions. Please call the main number to the clinic [phone] and follow the prompts.  For any non-urgent questions, you may also contact your provider using MyChart. We now offer e-Visits for anyone 4 and older to request care online for non-urgent symptoms. For details visit [email or web address].   Also download the MyChart app! Go to the app store, search MyChart, open the app, select [place], and log in with your MyChart username and password.

## 2023-11-25 ENCOUNTER — Other Ambulatory Visit: Payer: Self-pay

## 2023-11-25 ENCOUNTER — Other Ambulatory Visit (HOSPITAL_BASED_OUTPATIENT_CLINIC_OR_DEPARTMENT_OTHER): Payer: Self-pay

## 2023-11-25 ENCOUNTER — Inpatient Hospital Stay: Attending: Hematology & Oncology

## 2023-11-25 ENCOUNTER — Inpatient Hospital Stay

## 2023-11-25 ENCOUNTER — Encounter: Payer: Self-pay | Admitting: Hematology & Oncology

## 2023-11-25 VITALS — BP 133/75 | HR 63 | Resp 18

## 2023-11-25 DIAGNOSIS — C9200 Acute myeloblastic leukemia, not having achieved remission: Secondary | ICD-10-CM | POA: Diagnosis not present

## 2023-11-25 DIAGNOSIS — Z5111 Encounter for antineoplastic chemotherapy: Secondary | ICD-10-CM | POA: Insufficient documentation

## 2023-11-25 DIAGNOSIS — Z952 Presence of prosthetic heart valve: Secondary | ICD-10-CM | POA: Insufficient documentation

## 2023-11-25 DIAGNOSIS — Z7901 Long term (current) use of anticoagulants: Secondary | ICD-10-CM | POA: Diagnosis not present

## 2023-11-25 DIAGNOSIS — D4621 Refractory anemia with excess of blasts 1: Secondary | ICD-10-CM

## 2023-11-25 DIAGNOSIS — Z79899 Other long term (current) drug therapy: Secondary | ICD-10-CM | POA: Insufficient documentation

## 2023-11-25 DIAGNOSIS — D46Z Other myelodysplastic syndromes: Secondary | ICD-10-CM

## 2023-11-25 DIAGNOSIS — C9201 Acute myeloblastic leukemia, in remission: Secondary | ICD-10-CM

## 2023-11-25 MED ORDER — NORMAL SALINE FLUSH 0.9 % IV SOLN
10.0000 mL | Freq: Every day | INTRAVENOUS | 1 refills | Status: DC
  Filled 2023-11-25: qty 300, 30d supply, fill #0

## 2023-11-25 MED ORDER — CYTARABINE CHEMO INJECTION (PF) 20 MG/ML
20.0000 mg | Freq: Once | INTRAMUSCULAR | Status: AC
  Administered 2023-11-25: 20 mg via SUBCUTANEOUS
  Filled 2023-11-25: qty 1

## 2023-11-25 MED ORDER — PROCHLORPERAZINE MALEATE 10 MG PO TABS: 10.0000 mg | ORAL_TABLET | Freq: Once | ORAL | Status: DC

## 2023-11-25 NOTE — Patient Instructions (Signed)
 Cytarabine  Injection What is this medication? CYTARABINE  (sye TARE a been) treats leukemia. It works by slowing down the growth of cancer cells. This medicine may be used for other purposes; ask your health care provider or pharmacist if you have questions. COMMON BRAND NAME(S): Cytosar-U  What should I tell my care team before I take this medication? They need to know if you have any of these conditions: Bleeding problems Infection, such as chickenpox, cold sores, herpes Kidney disease Liver disease Recent or ongoing radiation therapy An unusual or allergic reaction to cytarabine , benzyl alcohol, other medications, foods, dyes, or preservatives Pregnant or trying to get pregnant Breast-feeding How should I use this medication? This medication is injected into a vein or under the skin. It may also be given into the spinal fluid. It is given by your care team in a hospital or clinic setting. Talk to your care team about the use of this medication in children. While it may be prescribed for children for selected conditions, precautions do apply. Overdosage: If you think you have taken too much of this medicine contact a poison control center or emergency room at once. NOTE: This medicine is only for you. Do not share this medicine with others. What if I miss a dose? Keep appointments for follow-up doses. It is important not to miss your dose. Call your care team if you are unable to keep an appointment. What may interact with this medication? Do not take this medication with any of the following: Live virus vaccines This medication may also interact with the following: Digoxin Fluorocytosine Gentamicin NSAIDs, medications for pain and inflammation, such as ibuprofen or naproxen This list may not describe all possible interactions. Give your health care provider a list of all the medicines, herbs, non-prescription drugs, or dietary supplements you use. Also tell them if you smoke, drink  alcohol, or use illegal drugs. Some items may interact with your medicine. What should I watch for while using this medication? Your condition will be monitored carefully while you are receiving this medication. This medication may make you feel generally unwell. This is not uncommon as chemotherapy can affect healthy cells as well as cancer cells. Report any side effects. Continue your course of treatment even though you feel ill unless your care team tells you to stop. In some cases, you may be given additional medications to help with side effects. Follow all directions for their use. This medication may increase your risk of getting an infection. Call your care team for advice if you get a fever, chills, sore throat, or other symptoms of a cold or flu. Do not treat yourself. Try to avoid being around people who are sick. This medication may increase your risk to bruise or bleed. Call your care team if you notice any unusual bleeding. Be careful brushing or flossing your teeth or using a toothpick because you may get an infection or bleed more easily. If you have any dental work done, tell your dentist you are receiving this medication. Avoid taking medications that contain aspirin, acetaminophen , ibuprofen, naproxen, or ketoprofen unless instructed by your care team. These medications may hide a fever. Do not have any vaccinations without your care team's approval. Avoid anyone who has recently had oral polio vaccine. Talk to your care team if you wish to become pregnant or think you might be pregnant. This medication can cause serious birth defects if taken during pregnancy. A reliable form of contraception is recommended while taking this medication. Talk to your  care team about effective forms of contraception. Do not breastfeed while taking this medication. What side effects may I notice from receiving this medication? Side effects that you should report to your care team as soon as  possible: Allergic reactions--skin rash, itching, hives, swelling of the face, lips, tongue, or throat High uric acid level--severe pain, redness, warmth, or swelling in joints, pain or trouble passing urine, pain in the lower back or sides Infection--fever, chills, cough, sore throat, wounds that don't heal, pain or trouble when passing urine, general feeling of discomfort or being unwell Liver injury--right upper belly pain, loss of appetite, nausea, light-colored stool, dark yellow or brown urine, yellowing skin or eyes, unusual weakness or fatigue Low red blood cell level--unusual weakness or fatigue, dizziness, headache, trouble breathing Unusual bruising or bleeding Side effects that usually do not require medical attention (report to your care team if they continue or are bothersome): Diarrhea Loss of appetite Nausea Stomach pain Vomiting This list may not describe all possible side effects. Call your doctor for medical advice about side effects. You may report side effects to FDA at 1-800-FDA-1088. Where should I keep my medication? This medication is given in a hospital or clinic. It will not be stored at home. NOTE: This sheet is a summary. It may not cover all possible information. If you have questions about this medicine, talk to your doctor, pharmacist, or health care provider.  2024 Elsevier/Gold Standard (2021-06-20 00:00:00)

## 2023-11-26 ENCOUNTER — Inpatient Hospital Stay

## 2023-11-26 ENCOUNTER — Encounter: Payer: Self-pay | Admitting: Hematology & Oncology

## 2023-11-26 VITALS — BP 141/68 | HR 58 | Temp 97.5°F | Resp 20

## 2023-11-26 DIAGNOSIS — Z79899 Other long term (current) drug therapy: Secondary | ICD-10-CM | POA: Diagnosis not present

## 2023-11-26 DIAGNOSIS — Z5111 Encounter for antineoplastic chemotherapy: Secondary | ICD-10-CM | POA: Diagnosis not present

## 2023-11-26 DIAGNOSIS — D46Z Other myelodysplastic syndromes: Secondary | ICD-10-CM

## 2023-11-26 DIAGNOSIS — C9201 Acute myeloblastic leukemia, in remission: Secondary | ICD-10-CM

## 2023-11-26 DIAGNOSIS — D4621 Refractory anemia with excess of blasts 1: Secondary | ICD-10-CM

## 2023-11-26 DIAGNOSIS — C9200 Acute myeloblastic leukemia, not having achieved remission: Secondary | ICD-10-CM | POA: Diagnosis not present

## 2023-11-26 DIAGNOSIS — Z7901 Long term (current) use of anticoagulants: Secondary | ICD-10-CM | POA: Diagnosis not present

## 2023-11-26 DIAGNOSIS — Z952 Presence of prosthetic heart valve: Secondary | ICD-10-CM | POA: Diagnosis not present

## 2023-11-26 LAB — CBC WITH DIFFERENTIAL (CANCER CENTER ONLY)
Abs Immature Granulocytes: 0.02 K/uL (ref 0.00–0.07)
Basophils Absolute: 0 K/uL (ref 0.0–0.1)
Basophils Relative: 0 %
Eosinophils Absolute: 0 K/uL (ref 0.0–0.5)
Eosinophils Relative: 2 %
HCT: 30.7 % — ABNORMAL LOW (ref 39.0–52.0)
Hemoglobin: 10.2 g/dL — ABNORMAL LOW (ref 13.0–17.0)
Immature Granulocytes: 1 %
Lymphocytes Relative: 3 %
Lymphs Abs: 0.1 K/uL — ABNORMAL LOW (ref 0.7–4.0)
MCH: 32 pg (ref 26.0–34.0)
MCHC: 33.2 g/dL (ref 30.0–36.0)
MCV: 96.2 fL (ref 80.0–100.0)
Monocytes Absolute: 0 K/uL — ABNORMAL LOW (ref 0.1–1.0)
Monocytes Relative: 0 %
Neutro Abs: 2.4 K/uL (ref 1.7–7.7)
Neutrophils Relative %: 94 %
Platelet Count: 78 K/uL — ABNORMAL LOW (ref 150–400)
RBC: 3.19 MIL/uL — ABNORMAL LOW (ref 4.22–5.81)
RDW: 16.7 % — ABNORMAL HIGH (ref 11.5–15.5)
WBC Count: 2.5 K/uL — ABNORMAL LOW (ref 4.0–10.5)
nRBC: 0 % (ref 0.0–0.2)

## 2023-11-26 LAB — CMP (CANCER CENTER ONLY)
ALT: 17 U/L (ref 0–44)
AST: 20 U/L (ref 15–41)
Albumin: 4 g/dL (ref 3.5–5.0)
Alkaline Phosphatase: 62 U/L (ref 38–126)
Anion gap: 8 (ref 5–15)
BUN: 19 mg/dL (ref 8–23)
CO2: 26 mmol/L (ref 22–32)
Calcium: 9.5 mg/dL (ref 8.9–10.3)
Chloride: 105 mmol/L (ref 98–111)
Creatinine: 0.95 mg/dL (ref 0.61–1.24)
GFR, Estimated: >60 mL/min (ref >60)
Glucose, Bld: 107 mg/dL — ABNORMAL HIGH (ref 70–99)
Potassium: 4.4 mmol/L (ref 3.5–5.1)
Sodium: 140 mmol/L (ref 135–145)
Total Bilirubin: 0.8 mg/dL (ref 0.0–1.2)
Total Protein: 5.9 g/dL — ABNORMAL LOW (ref 6.5–8.1)

## 2023-11-26 MED ORDER — CYTARABINE CHEMO INJECTION (PF) 20 MG/ML
20.0000 mg | Freq: Once | INTRAMUSCULAR | Status: AC
  Administered 2023-11-26: 20 mg via SUBCUTANEOUS
  Filled 2023-11-26: qty 1

## 2023-11-26 MED ORDER — PROCHLORPERAZINE MALEATE 10 MG PO TABS: 10.0000 mg | ORAL_TABLET | Freq: Once | ORAL | Status: DC

## 2023-11-26 NOTE — Patient Instructions (Signed)
 [facility] - A DEPT OF [place]  Discharge Instructions: Thank you for choosing [facility] to provide your oncology and hematology care.   If you have a lab appointment with the Cancer Center, please go directly to the Cancer Center and check in at the registration area.  Wear comfortable clothing and clothing appropriate for easy access to any Portacath or PICC line.   We strive to give you quality time with your provider. You may need to reschedule your appointment if you arrive late (15 or more minutes).  Arriving late affects you and other patients whose appointments are after yours.  Also, if you miss three or more appointments without notifying the office, you may be dismissed from the clinic at the provider's discretion.      For prescription refill requests, have your pharmacy contact our office and allow 72 hours for refills to be completed.    Today you received the following chemotherapy and/or immunotherapy agents Cytarabine .   To help prevent nausea and vomiting after your treatment, we encourage you to take your nausea medication as directed.  BELOW ARE SYMPTOMS THAT SHOULD BE REPORTED IMMEDIATELY: *FEVER GREATER THAN 100.4 F (38 C) OR HIGHER *CHILLS OR SWEATING *NAUSEA AND VOMITING THAT IS NOT CONTROLLED WITH YOUR NAUSEA MEDICATION *UNUSUAL SHORTNESS OF BREATH *UNUSUAL BRUISING OR BLEEDING *URINARY PROBLEMS (pain or burning when urinating, or frequent urination) *BOWEL PROBLEMS (unusual diarrhea, constipation, pain near the anus) TENDERNESS IN MOUTH AND THROAT WITH OR WITHOUT PRESENCE OF ULCERS (sore throat, sores in mouth, or a toothache) UNUSUAL RASH, SWELLING OR PAIN  UNUSUAL VAGINAL DISCHARGE OR ITCHING   Items with * indicate a potential emergency and should be followed up as soon as possible or go to the Emergency Department if any problems should occur.  Please show the CHEMOTHERAPY ALERT CARD or IMMUNOTHERAPY  ALERT CARD at check-in to the Emergency Department and triage nurse. Should you have questions after your visit or need to cancel or reschedule your appointment, please contact [facility] - A DEPT OF [facility]  [phone] and follow the prompts.  Office hours are 8:00 a.m. to 4:30 p.m. Monday - Friday. Please note that voicemails left after 4:00 p.m. may not be returned until the following business day.  We are closed weekends and major holidays. You have access to a nurse at all times for urgent questions. Please call the main number to the clinic [phone] and follow the prompts.  For any non-urgent questions, you may also contact your provider using MyChart. We now offer e-Visits for anyone 12 and older to request care online for non-urgent symptoms. For details visit [email or web address].   Also download the MyChart app! Go to the app store, search MyChart, open the app, select [place], and log in with your MyChart username and password.

## 2023-11-27 ENCOUNTER — Inpatient Hospital Stay

## 2023-11-27 VITALS — BP 118/68 | HR 65 | Temp 97.7°F | Resp 20

## 2023-11-27 DIAGNOSIS — C9200 Acute myeloblastic leukemia, not having achieved remission: Secondary | ICD-10-CM | POA: Diagnosis not present

## 2023-11-27 DIAGNOSIS — Z79899 Other long term (current) drug therapy: Secondary | ICD-10-CM | POA: Diagnosis not present

## 2023-11-27 DIAGNOSIS — D46Z Other myelodysplastic syndromes: Secondary | ICD-10-CM

## 2023-11-27 DIAGNOSIS — Z7901 Long term (current) use of anticoagulants: Secondary | ICD-10-CM | POA: Diagnosis not present

## 2023-11-27 DIAGNOSIS — Z5111 Encounter for antineoplastic chemotherapy: Secondary | ICD-10-CM | POA: Diagnosis not present

## 2023-11-27 DIAGNOSIS — D4621 Refractory anemia with excess of blasts 1: Secondary | ICD-10-CM

## 2023-11-27 DIAGNOSIS — C9201 Acute myeloblastic leukemia, in remission: Secondary | ICD-10-CM

## 2023-11-27 DIAGNOSIS — Z952 Presence of prosthetic heart valve: Secondary | ICD-10-CM | POA: Diagnosis not present

## 2023-11-27 MED ORDER — PROCHLORPERAZINE MALEATE 10 MG PO TABS: 10.0000 mg | ORAL_TABLET | Freq: Once | ORAL | Status: DC

## 2023-11-27 MED ORDER — CYTARABINE CHEMO INJECTION (PF) 20 MG/ML
20.0000 mg | Freq: Once | INTRAMUSCULAR | Status: AC
  Administered 2023-11-27: 20 mg via SUBCUTANEOUS
  Filled 2023-11-27: qty 1

## 2023-11-27 NOTE — Patient Instructions (Signed)
 [facility] - A DEPT OF [place]  Discharge Instructions: Thank you for choosing [facility] to provide your oncology and hematology care.   If you have a lab appointment with the Cancer Center, please go directly to the Cancer Center and check in at the registration area.  Wear comfortable clothing and clothing appropriate for easy access to any Portacath or PICC line.   We strive to give you quality time with your provider. You may need to reschedule your appointment if you arrive late (15 or more minutes).  Arriving late affects you and other patients whose appointments are after yours.  Also, if you miss three or more appointments without notifying the office, you may be dismissed from the clinic at the provider's discretion.      For prescription refill requests, have your pharmacy contact our office and allow 72 hours for refills to be completed.    Today you received the following chemotherapy and/or immunotherapy agents:  Ara-C      To help prevent nausea and vomiting after your treatment, we encourage you to take your nausea medication as directed.  BELOW ARE SYMPTOMS THAT SHOULD BE REPORTED IMMEDIATELY: *FEVER GREATER THAN 100.4 F (38 C) OR HIGHER *CHILLS OR SWEATING *NAUSEA AND VOMITING THAT IS NOT CONTROLLED WITH YOUR NAUSEA MEDICATION *UNUSUAL SHORTNESS OF BREATH *UNUSUAL BRUISING OR BLEEDING *URINARY PROBLEMS (pain or burning when urinating, or frequent urination) *BOWEL PROBLEMS (unusual diarrhea, constipation, pain near the anus) TENDERNESS IN MOUTH AND THROAT WITH OR WITHOUT PRESENCE OF ULCERS (sore throat, sores in mouth, or a toothache) UNUSUAL RASH, SWELLING OR PAIN  UNUSUAL VAGINAL DISCHARGE OR ITCHING   Items with * indicate a potential emergency and should be followed up as soon as possible or go to the Emergency Department if any problems should occur.  Please show the CHEMOTHERAPY ALERT CARD or IMMUNOTHERAPY  ALERT CARD at check-in to the Emergency Department and triage nurse. Should you have questions after your visit or need to cancel or reschedule your appointment, please contact [facility] - A DEPT OF [facility]  [phone] and follow the prompts.  Office hours are 8:00 a.m. to 4:30 p.m. Monday - Friday. Please note that voicemails left after 4:00 p.m. may not be returned until the following business day.  We are closed weekends and major holidays. You have access to a nurse at all times for urgent questions. Please call the main number to the clinic [phone] and follow the prompts.  For any non-urgent questions, you may also contact your provider using MyChart. We now offer e-Visits for anyone 4 and older to request care online for non-urgent symptoms. For details visit [email or web address].   Also download the MyChart app! Go to the app store, search MyChart, open the app, select [place], and log in with your MyChart username and password.

## 2023-11-27 NOTE — Patient Instructions (Signed)
 [facility] - A DEPT OF [facility]  Discharge Instructions: Thank you for choosing [facility] to provide your oncology and hematology care.   If you have a lab appointment with the Cancer Center, please go directly to the Cancer Center and check in at the registration area.  Wear comfortable clothing and clothing appropriate for easy access to any Portacath or PICC line.   We strive to give you quality time with your provider. You may need to reschedule your appointment if you arrive late (15 or more minutes).  Arriving late affects you and other patients whose appointments are after yours.  Also, if you miss three or more appointments without notifying the office, you may be dismissed from the clinic at the provider's discretion.      For prescription refill requests, have your pharmacy contact our office and allow 72 hours for refills to be completed.    Today you received the following chemotherapy and/or immunotherapy agents:  Vidaza      To help prevent nausea and vomiting after your treatment, we encourage you to take your nausea medication as directed.  BELOW ARE SYMPTOMS THAT SHOULD BE REPORTED IMMEDIATELY: *FEVER GREATER THAN 100.4 F (38 C) OR HIGHER *CHILLS OR SWEATING *NAUSEA AND VOMITING THAT IS NOT CONTROLLED WITH YOUR NAUSEA MEDICATION *UNUSUAL SHORTNESS OF BREATH *UNUSUAL BRUISING OR BLEEDING *URINARY PROBLEMS (pain or burning when urinating, or frequent urination) *BOWEL PROBLEMS (unusual diarrhea, constipation, pain near the anus) TENDERNESS IN MOUTH AND THROAT WITH OR WITHOUT PRESENCE OF ULCERS (sore throat, sores in mouth, or a toothache) UNUSUAL RASH, SWELLING OR PAIN  UNUSUAL VAGINAL DISCHARGE OR ITCHING   Items with * indicate a potential emergency and should be followed up as soon as possible or go to the Emergency Department if any problems should occur.  Please show the CHEMOTHERAPY ALERT CARD or IMMUNOTHERAPY  ALERT CARD at check-in to the Emergency Department and triage nurse. Should you have questions after your visit or need to cancel or reschedule your appointment, please contact [facility] - A DEPT OF [facility]  [phone] and follow the prompts.  Office hours are 8:00 a.m. to 4:30 p.m. Monday - Friday. Please note that voicemails left after 4:00 p.m. may not be returned until the following business day.  We are closed weekends and major holidays. You have access to a nurse at all times for urgent questions. Please call the main number to the clinic [phone] and follow the prompts.  For any non-urgent questions, you may also contact your provider using MyChart. We now offer e-Visits for anyone 30 and older to request care online for non-urgent symptoms. For details visit [email or web address].   Also download the MyChart app! Go to the app store, search "MyChart", open the app, select [place], and log in with your MyChart username and password.

## 2023-11-27 NOTE — Progress Notes (Signed)
 Ok to treat with platelets of 78,000 from 10/2 labs per Dr. Timmy.  Norleen JAYSON Sou, [facility] 11/27/23 8:25 AM

## 2023-11-30 ENCOUNTER — Inpatient Hospital Stay

## 2023-11-30 ENCOUNTER — Other Ambulatory Visit (HOSPITAL_BASED_OUTPATIENT_CLINIC_OR_DEPARTMENT_OTHER): Payer: Self-pay

## 2023-11-30 VITALS — BP 136/74 | HR 67 | Temp 98.0°F | Resp 18

## 2023-11-30 DIAGNOSIS — D4621 Refractory anemia with excess of blasts 1: Secondary | ICD-10-CM

## 2023-11-30 DIAGNOSIS — D709 Neutropenia, unspecified: Secondary | ICD-10-CM

## 2023-11-30 DIAGNOSIS — D708 Other neutropenia: Secondary | ICD-10-CM

## 2023-11-30 DIAGNOSIS — Z79899 Other long term (current) drug therapy: Secondary | ICD-10-CM | POA: Diagnosis not present

## 2023-11-30 DIAGNOSIS — D72810 Lymphocytopenia: Secondary | ICD-10-CM

## 2023-11-30 DIAGNOSIS — Z5111 Encounter for antineoplastic chemotherapy: Secondary | ICD-10-CM | POA: Diagnosis not present

## 2023-11-30 DIAGNOSIS — C9201 Acute myeloblastic leukemia, in remission: Secondary | ICD-10-CM

## 2023-11-30 DIAGNOSIS — Z7901 Long term (current) use of anticoagulants: Secondary | ICD-10-CM | POA: Diagnosis not present

## 2023-11-30 DIAGNOSIS — D46Z Other myelodysplastic syndromes: Secondary | ICD-10-CM

## 2023-11-30 DIAGNOSIS — D696 Thrombocytopenia, unspecified: Secondary | ICD-10-CM

## 2023-11-30 DIAGNOSIS — Z952 Presence of prosthetic heart valve: Secondary | ICD-10-CM | POA: Diagnosis not present

## 2023-11-30 DIAGNOSIS — C9200 Acute myeloblastic leukemia, not having achieved remission: Secondary | ICD-10-CM | POA: Diagnosis not present

## 2023-11-30 LAB — CMP (CANCER CENTER ONLY)
ALT: 21 U/L (ref 0–44)
AST: 21 U/L (ref 15–41)
Albumin: 4.4 g/dL (ref 3.5–5.0)
Alkaline Phosphatase: 66 U/L (ref 38–126)
Anion gap: 10 (ref 5–15)
BUN: 20 mg/dL (ref 8–23)
CO2: 25 mmol/L (ref 22–32)
Calcium: 10 mg/dL (ref 8.9–10.3)
Chloride: 105 mmol/L (ref 98–111)
Creatinine: 1.17 mg/dL (ref 0.61–1.24)
GFR, Estimated: >60 mL/min (ref >60)
Glucose, Bld: 119 mg/dL — ABNORMAL HIGH (ref 70–99)
Potassium: 4.1 mmol/L (ref 3.5–5.1)
Sodium: 141 mmol/L (ref 135–145)
Total Bilirubin: 1.3 mg/dL — ABNORMAL HIGH (ref 0.0–1.2)
Total Protein: 6.3 g/dL — ABNORMAL LOW (ref 6.5–8.1)

## 2023-11-30 LAB — CBC WITH DIFFERENTIAL (CANCER CENTER ONLY)
Basophils Absolute: 0 K/uL (ref 0.0–0.1)
Basophils Relative: 0 %
Eosinophils Absolute: 0 K/uL (ref 0.0–0.5)
Eosinophils Relative: 2 %
HCT: 29.6 % — ABNORMAL LOW (ref 39.0–52.0)
Hemoglobin: 10.2 g/dL — ABNORMAL LOW (ref 13.0–17.0)
Lymphocytes Relative: 20 %
Lymphs Abs: 0.1 K/uL — ABNORMAL LOW (ref 0.7–4.0)
MCH: 32.5 pg (ref 26.0–34.0)
MCHC: 34.5 g/dL (ref 30.0–36.0)
MCV: 94.3 fL (ref 80.0–100.0)
Monocytes Absolute: 0.1 K/uL (ref 0.1–1.0)
Monocytes Relative: 14 %
Neutro Abs: 0.4 K/uL — CL (ref 1.7–7.7)
Neutrophils Relative %: 64 %
Platelet Count: 40 K/uL — ABNORMAL LOW (ref 150–400)
RBC: 3.14 MIL/uL — ABNORMAL LOW (ref 4.22–5.81)
RDW: 16 % — ABNORMAL HIGH (ref 11.5–15.5)
Smear Review: NORMAL
WBC Count: 0.6 K/uL — CL (ref 4.0–10.5)
nRBC: 0 % (ref 0.0–0.2)

## 2023-11-30 LAB — D-DIMER, QUANTITATIVE: D-Dimer, Quant: 1.62 ug{FEU}/mL — ABNORMAL HIGH (ref 0.00–0.50)

## 2023-11-30 LAB — APTT: aPTT: 33 s (ref 24–36)

## 2023-11-30 LAB — SAMPLE TO BLOOD BANK

## 2023-11-30 LAB — MAGNESIUM: Magnesium: 2.1 mg/dL (ref 1.7–2.4)

## 2023-11-30 LAB — PROTIME-INR
INR: 1 (ref 0.8–1.2)
Prothrombin Time: 13.9 s (ref 11.4–15.2)

## 2023-11-30 LAB — FIBRINOGEN: Fibrinogen: 324 mg/dL (ref 210–475)

## 2023-11-30 LAB — PHOSPHORUS: Phosphorus: 3 mg/dL (ref 2.5–4.6)

## 2023-11-30 LAB — LACTATE DEHYDROGENASE: LDH: 262 U/L — ABNORMAL HIGH (ref 98–192)

## 2023-11-30 LAB — URIC ACID: Uric Acid, Serum: 3.7 mg/dL (ref 3.7–8.6)

## 2023-11-30 MED ORDER — PROCHLORPERAZINE MALEATE 10 MG PO TABS: 10.0000 mg | ORAL_TABLET | Freq: Once | ORAL | Status: DC

## 2023-11-30 MED ORDER — CYTARABINE CHEMO INJECTION (PF) 20 MG/ML
20.0000 mg | Freq: Once | INTRAMUSCULAR | Status: AC
  Administered 2023-11-30: 20 mg via SUBCUTANEOUS
  Filled 2023-11-30: qty 1

## 2023-11-30 NOTE — Progress Notes (Signed)
Labs reviewed with MD, ok to treat despite counts. 

## 2023-11-30 NOTE — Patient Instructions (Signed)
 PICC Home Care Guide A peripherally inserted central catheter (PICC) is a form of IV access that allows medicines and IV fluids to be quickly put into the blood and spread throughout the body. The PICC is a long, thin, flexible tube (catheter) that is put into a vein in a person's arm or leg. The catheter ends in a large vein just outside the heart called the superior vena cava (SVC). After the PICC is put in, a chest X-ray may be done to make sure that it is in the right place. A PICC may be placed for different reasons, such as: To give medicines and liquid nutrition. To give IV fluids and blood products. To take blood samples often. If there is trouble placing a peripheral intravenous (PIV) catheter. If cared for properly, a PICC can remain in place for many months. Having a PICC can allow you to go home from the hospital sooner and continue treatment at home. Medicines and PICC care can be managed at home by a family member, caregiver, or home health care team. What are the risks? Generally, having a PICC is safe. However, problems may occur, including: A blood clot (thrombus) forming in or at the end of the PICC. A blood clot forming in a vein (deep vein thrombosis) or traveling to the lung (pulmonary embolism). Inflammation of the vein (phlebitis) in which the PICC is placed. Infection at the insertion site or in the blood. Blood infections from central lines, like PICCs, can be serious and often require a hospital stay. PICC malposition, or PICC movement or poor placement. A break or cut in the PICC. Do not use scissors near the PICC. Nerve or tendon irritation or injury during PICC insertion. How to care for your PICC Please follow the specific guidelines provided by your health care provider. Preventing infection You and any caregivers should wash your hands often with soap and water for at least 20 seconds. Wash hands: Before touching the PICC or the infusion device. Before changing a  bandage (dressing). Do not change the dressing unless you have been taught to do so and have shown you are able to change it safely. Flush the PICC as told. Tell your health care provider right away if the PICC is hard to flush or does not flush. Do not use force to flush the PICC. Use clean and germ-free (sterile) supplies only. Keep the supplies in a dry place. Do not reuse needles, syringes, or any other supplies. Reusing supplies can lead to infection. Keep the PICC dressing dry and secure it with tape if the edges stop sticking to your skin. Check your PICC insertion site every day for signs of infection. Check for: Redness, swelling, or pain. Fluid or blood. Warmth. Pus or a bad smell. Preventing other problems Do not use a syringe that is less than 10 mL to flush the PICC. Do not have your blood pressure checked on the arm in which the PICC is placed. Do not ever pull or tug on the PICC. Keep it secured to your arm with tape or a stretch wrap when not in use. Do not take the PICC out yourself. Only a trained health care provider should remove the PICC. Keep pets and children away from your PICC. How to care for your PICC dressing Keep your PICC dressing clean and dry to prevent infection. Do not take baths, swim, or use a hot tub until your health care provider approves. Ask your health care provider if you can take  showers. You may only be allowed to take sponge baths. When you are allowed to shower: Ask your health care provider to teach you how to wrap the PICC. Cover the PICC with clear plastic wrap and tape to keep it dry while showering. Follow instructions from your health care provider about how to take care of your insertion site and dressing. Make sure you: Wash your hands with soap and water for at least 20 seconds before and after you change your dressing. If soap and water are not available, use hand sanitizer. Change your dressing only if taught to do so by your health care  provider. Your PICC dressing needs to be changed if it becomes loose or wet. Leave stitches (sutures), skin glue, or adhesive strips in place. These skin closures may need to stay in place for 2 weeks or longer. If adhesive strip edges start to loosen and curl up, you may trim the loose edges. Do not remove adhesive strips completely unless your health care provider tells you to do that. Follow these instructions at home: Disposal of supplies Throw away any syringes in a disposal container that is meant for sharp items (sharps container). You can buy a sharps container from a pharmacy, or you can make one by using an empty, hard plastic bottle with a lid. Place any used dressings or infusion bags into a plastic bag. Throw that bag in the trash. General instructions  Always carry your PICC identification card or wear a medical alert bracelet. Keep the tube clamped at all times, unless it is being used. Always carry a smooth-edge clamp with you to clamp the PICC if it breaks. Do not use scissors or sharp objects near the tube. You may bend your arm and move it freely. If your PICC is near or at the bend of your elbow, avoid activity with repeated motion at the elbow. Avoid lifting heavy objects as told by your health care provider. Keep all follow-up visits. This is important. You will need to have your PICC dressing changed at least once a week. Contact a health care provider if: You have pain in your arm, ear, face, or teeth. You have a fever or chills. You have redness, swelling, or pain around the insertion site. You have fluid or blood coming from the insertion site. Your insertion site feels warm to the touch. You have pus or a bad smell coming from the insertion site. Your skin feels hard and raised around the insertion site. Your PICC dressing has gotten wet or is coming off and you have not been taught how to change it. Get help right away if: You have problems with your PICC, such as  your PICC: Was tugged or pulled and has partially come out. Do not  push the PICC back in. Cannot be flushed, is hard to flush, or leaks around the insertion site when it is flushed. Makes a flushing sound when it is flushed. Appears to have a hole or tear. Is accidentally pulled all the way out. If this happens, cover the insertion site with a gauze dressing. Do not throw the PICC away. Your health care provider will need to check it to be sure the entire catheter came out. You feel your heart racing or skipping beats, or you have chest pain. You have shortness of breath or trouble breathing. You have swelling, redness, warmth, or pain in the arm in which the PICC is placed. You have a red streak going up your arm that  starts under the PICC dressing. These symptoms may be an emergency. Get help right away. Call 911. Do not wait to see if the symptoms will go away. Do not drive yourself to the hospital. Summary A peripherally inserted central catheter (PICC) is a long, thin, flexible tube (catheter) that is put into a vein in the arm or leg. If cared for properly, a PICC can remain in place for many months. Having a PICC can allow you to go home from the hospital sooner and continue treatment at home. The PICC is inserted using a germ-free (sterile) technique by a specially trained health care provider. Only a trained health care provider should remove it. Do not have your blood pressure checked on the arm in which your PICC is placed. Always keep your PICC identification card with you. This information is not intended to replace advice given to you by your health care provider. Make sure you discuss any questions you have with your health care provider. Document Revised: 08/29/2020 Document Reviewed: 08/29/2020 Elsevier Patient Education  2024 [employer].

## 2023-11-30 NOTE — Progress Notes (Signed)
 Critical results received from lab of WBC 0.6 and ANC 0.4 Dr. Timmy aware. Patient platelets 40; Dr. Timmy aware and pt instructed to half his lovenox  dose in half. Pt verbalized understanding and had no further questions.

## 2023-12-01 ENCOUNTER — Inpatient Hospital Stay

## 2023-12-01 VITALS — BP 109/57 | HR 62 | Temp 97.9°F | Resp 19

## 2023-12-01 DIAGNOSIS — Z79899 Other long term (current) drug therapy: Secondary | ICD-10-CM | POA: Diagnosis not present

## 2023-12-01 DIAGNOSIS — D46Z Other myelodysplastic syndromes: Secondary | ICD-10-CM

## 2023-12-01 DIAGNOSIS — C9200 Acute myeloblastic leukemia, not having achieved remission: Secondary | ICD-10-CM | POA: Diagnosis not present

## 2023-12-01 DIAGNOSIS — D4621 Refractory anemia with excess of blasts 1: Secondary | ICD-10-CM

## 2023-12-01 DIAGNOSIS — C9201 Acute myeloblastic leukemia, in remission: Secondary | ICD-10-CM

## 2023-12-01 DIAGNOSIS — Z952 Presence of prosthetic heart valve: Secondary | ICD-10-CM | POA: Diagnosis not present

## 2023-12-01 DIAGNOSIS — Z7901 Long term (current) use of anticoagulants: Secondary | ICD-10-CM | POA: Diagnosis not present

## 2023-12-01 DIAGNOSIS — Z5111 Encounter for antineoplastic chemotherapy: Secondary | ICD-10-CM | POA: Diagnosis not present

## 2023-12-01 MED ORDER — PROCHLORPERAZINE MALEATE 10 MG PO TABS: 10.0000 mg | ORAL_TABLET | Freq: Once | ORAL | Status: DC

## 2023-12-01 MED ORDER — CYTARABINE CHEMO INJECTION (PF) 20 MG/ML
20.0000 mg | Freq: Once | INTRAMUSCULAR | Status: AC
  Administered 2023-12-01: 20 mg via SUBCUTANEOUS
  Filled 2023-12-01: qty 1

## 2023-12-01 NOTE — Progress Notes (Signed)
 Labs reviewed by MD, ok to treat despite counts. Pt verbalized he cut Lovenox  dose in half yesterday.

## 2023-12-01 NOTE — Patient Instructions (Signed)
 [facility] - A DEPT OF [place]  Discharge Instructions: Thank you for choosing [facility] to provide your oncology and hematology care.   If you have a lab appointment with the Cancer Center, please go directly to the Cancer Center and check in at the registration area.  Wear comfortable clothing and clothing appropriate for easy access to any Portacath or PICC line.   We strive to give you quality time with your provider. You may need to reschedule your appointment if you arrive late (15 or more minutes).  Arriving late affects you and other patients whose appointments are after yours.  Also, if you miss three or more appointments without notifying the office, you may be dismissed from the clinic at the provider's discretion.      For prescription refill requests, have your pharmacy contact our office and allow 72 hours for refills to be completed.    Today you received the following chemotherapy and/or immunotherapy agents Cytarabine .   To help prevent nausea and vomiting after your treatment, we encourage you to take your nausea medication as directed.  BELOW ARE SYMPTOMS THAT SHOULD BE REPORTED IMMEDIATELY: *FEVER GREATER THAN 100.4 F (38 C) OR HIGHER *CHILLS OR SWEATING *NAUSEA AND VOMITING THAT IS NOT CONTROLLED WITH YOUR NAUSEA MEDICATION *UNUSUAL SHORTNESS OF BREATH *UNUSUAL BRUISING OR BLEEDING *URINARY PROBLEMS (pain or burning when urinating, or frequent urination) *BOWEL PROBLEMS (unusual diarrhea, constipation, pain near the anus) TENDERNESS IN MOUTH AND THROAT WITH OR WITHOUT PRESENCE OF ULCERS (sore throat, sores in mouth, or a toothache) UNUSUAL RASH, SWELLING OR PAIN  UNUSUAL VAGINAL DISCHARGE OR ITCHING   Items with * indicate a potential emergency and should be followed up as soon as possible or go to the Emergency Department if any problems should occur.  Please show the CHEMOTHERAPY ALERT CARD or IMMUNOTHERAPY  ALERT CARD at check-in to the Emergency Department and triage nurse. Should you have questions after your visit or need to cancel or reschedule your appointment, please contact [facility] - A DEPT OF [facility]  [phone] and follow the prompts.  Office hours are 8:00 a.m. to 4:30 p.m. Monday - Friday. Please note that voicemails left after 4:00 p.m. may not be returned until the following business day.  We are closed weekends and major holidays. You have access to a nurse at all times for urgent questions. Please call the main number to the clinic [phone] and follow the prompts.  For any non-urgent questions, you may also contact your provider using MyChart. We now offer e-Visits for anyone 12 and older to request care online for non-urgent symptoms. For details visit [email or web address].   Also download the MyChart app! Go to the app store, search MyChart, open the app, select [place], and log in with your MyChart username and password.

## 2023-12-04 ENCOUNTER — Other Ambulatory Visit: Payer: Self-pay

## 2023-12-07 ENCOUNTER — Inpatient Hospital Stay

## 2023-12-07 ENCOUNTER — Inpatient Hospital Stay: Admitting: Hematology & Oncology

## 2023-12-07 ENCOUNTER — Encounter: Payer: Self-pay | Admitting: Hematology & Oncology

## 2023-12-07 ENCOUNTER — Other Ambulatory Visit: Payer: Self-pay

## 2023-12-07 VITALS — BP 124/59 | HR 59 | Temp 97.8°F | Resp 16 | Ht 71.0 in | Wt 162.0 lb

## 2023-12-07 DIAGNOSIS — D46Z Other myelodysplastic syndromes: Secondary | ICD-10-CM | POA: Diagnosis not present

## 2023-12-07 DIAGNOSIS — D4621 Refractory anemia with excess of blasts 1: Secondary | ICD-10-CM

## 2023-12-07 DIAGNOSIS — D696 Thrombocytopenia, unspecified: Secondary | ICD-10-CM

## 2023-12-07 DIAGNOSIS — D72810 Lymphocytopenia: Secondary | ICD-10-CM

## 2023-12-07 DIAGNOSIS — Z5111 Encounter for antineoplastic chemotherapy: Secondary | ICD-10-CM | POA: Diagnosis not present

## 2023-12-07 DIAGNOSIS — D709 Neutropenia, unspecified: Secondary | ICD-10-CM

## 2023-12-07 DIAGNOSIS — Z952 Presence of prosthetic heart valve: Secondary | ICD-10-CM | POA: Diagnosis not present

## 2023-12-07 DIAGNOSIS — Z79899 Other long term (current) drug therapy: Secondary | ICD-10-CM | POA: Diagnosis not present

## 2023-12-07 DIAGNOSIS — Z7901 Long term (current) use of anticoagulants: Secondary | ICD-10-CM | POA: Diagnosis not present

## 2023-12-07 DIAGNOSIS — C9200 Acute myeloblastic leukemia, not having achieved remission: Secondary | ICD-10-CM | POA: Diagnosis not present

## 2023-12-07 DIAGNOSIS — D708 Other neutropenia: Secondary | ICD-10-CM

## 2023-12-07 LAB — CBC WITH DIFFERENTIAL (CANCER CENTER ONLY)
Abs Immature Granulocytes: 0.02 K/uL (ref 0.00–0.07)
Basophils Absolute: 0 K/uL (ref 0.0–0.1)
Basophils Relative: 0 %
Eosinophils Absolute: 0 K/uL (ref 0.0–0.5)
Eosinophils Relative: 0 %
HCT: 26 % — ABNORMAL LOW (ref 39.0–52.0)
Hemoglobin: 8.8 g/dL — ABNORMAL LOW (ref 13.0–17.0)
Immature Granulocytes: 1 %
Lymphocytes Relative: 7 %
Lymphs Abs: 0.1 K/uL — ABNORMAL LOW (ref 0.7–4.0)
MCH: 32.4 pg (ref 26.0–34.0)
MCHC: 33.8 g/dL (ref 30.0–36.0)
MCV: 95.6 fL (ref 80.0–100.0)
Monocytes Absolute: 0.2 K/uL (ref 0.1–1.0)
Monocytes Relative: 12 %
Neutro Abs: 1.5 K/uL — ABNORMAL LOW (ref 1.7–7.7)
Neutrophils Relative %: 80 %
Platelet Count: 29 K/uL — ABNORMAL LOW (ref 150–400)
RBC: 2.72 MIL/uL — ABNORMAL LOW (ref 4.22–5.81)
RDW: 16.8 % — ABNORMAL HIGH (ref 11.5–15.5)
WBC Count: 1.9 K/uL — ABNORMAL LOW (ref 4.0–10.5)
nRBC: 0 % (ref 0.0–0.2)

## 2023-12-07 LAB — CMP (CANCER CENTER ONLY)
ALT: 16 U/L (ref 0–44)
AST: 19 U/L (ref 15–41)
Albumin: 4.3 g/dL (ref 3.5–5.0)
Alkaline Phosphatase: 72 U/L (ref 38–126)
Anion gap: 10 (ref 5–15)
BUN: 12 mg/dL (ref 8–23)
CO2: 23 mmol/L (ref 22–32)
Calcium: 9.7 mg/dL (ref 8.9–10.3)
Chloride: 106 mmol/L (ref 98–111)
Creatinine: 1.11 mg/dL (ref 0.61–1.24)
GFR, Estimated: >60 mL/min (ref >60)
Glucose, Bld: 172 mg/dL — ABNORMAL HIGH (ref 70–99)
Potassium: 4.3 mmol/L (ref 3.5–5.1)
Sodium: 140 mmol/L (ref 135–145)
Total Bilirubin: 0.6 mg/dL (ref 0.0–1.2)
Total Protein: 6.1 g/dL — ABNORMAL LOW (ref 6.5–8.1)

## 2023-12-07 LAB — SAMPLE TO BLOOD BANK

## 2023-12-07 LAB — LACTATE DEHYDROGENASE: LDH: 278 U/L — ABNORMAL HIGH (ref 98–192)

## 2023-12-07 LAB — APTT: aPTT: 35 s (ref 24–36)

## 2023-12-07 LAB — FIBRINOGEN: Fibrinogen: 364 mg/dL (ref 210–475)

## 2023-12-07 LAB — MAGNESIUM: Magnesium: 1.9 mg/dL (ref 1.7–2.4)

## 2023-12-07 LAB — PROTIME-INR
INR: 1.1 (ref 0.8–1.2)
Prothrombin Time: 15.2 s (ref 11.4–15.2)

## 2023-12-07 LAB — PHOSPHORUS: Phosphorus: 2 mg/dL — ABNORMAL LOW (ref 2.5–4.6)

## 2023-12-07 LAB — D-DIMER, QUANTITATIVE: D-Dimer, Quant: 1.37 ug{FEU}/mL — ABNORMAL HIGH (ref 0.00–0.50)

## 2023-12-07 LAB — URIC ACID: Uric Acid, Serum: 3.2 mg/dL — ABNORMAL LOW (ref 3.7–8.6)

## 2023-12-07 MED ORDER — K PHOS MONO-SOD PHOS DI & MONO 155-852-130 MG PO TABS
1000.0000 mg | ORAL_TABLET | Freq: Once | ORAL | Status: AC
  Filled 2023-12-07: qty 4

## 2023-12-07 NOTE — Progress Notes (Signed)
 9 Hematology and Oncology Follow Up Visit  Patrick [name] [id number] [birth year] 83 y.o. 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  He is doing fairly well.  He did have his last cycle of chemotherapy after.  He does feel little bit tired.  He has had no problems with bleeding.  He is at no fever.  He is on antibiotic prophylaxis.  Think he goes back down to [facility] I think in early November.  He is going to have a bone marrow biopsy when he is down there.  I am sure that he will see Dr.  Laverna.  I think he is coming due for his next cycle of treatment.  I think this cycle might be decitabine along with venetoclax .  He is on Lovenox .  We adjusted his Lovenox  dose based on his thrombocytopenia.  I think he is on half dose right now.  He is eating okay.  He has had no problems with nausea or vomiting.  He has had no cough or shortness of breath.  There has been no leg swelling.  Currently, I would have to say that his performance status is probably ECOG 1.  He  Medications:  Current Outpatient Medications:    famciclovir  (FAMVIR ) 250 MG tablet, Take 250 mg by mouth daily., Disp: , Rfl:    itraconazole  (SPORANOX ) 100 MG capsule, Take 100 mg by mouth., Disp: , Rfl:    levofloxacin  (LEVAQUIN ) 250 MG tablet, Take 250 mg by mouth daily., Disp: , Rfl:    amLODipine  (NORVASC ) 2.5 MG tablet, Take 3 tablets (7.5 mg total) by mouth daily. Do not take if SBP < 110, Disp: 270 tablet, Rfl: 2   atorvastatin  (LIPITOR) 10 MG tablet, TAKE ONE TABLET BY MOUTH ONE TIME DAILY, Disp: 90 tablet, Rfl: 2   diphenoxylate -atropine  (LOMOTIL ) 2.5-0.025 MG tablet, Take 1 tablet by mouth 4 (four)  times daily as needed for diarrhea or loose stools., Disp: 60 tablet, Rfl: 1   enoxaparin  (LOVENOX ) 120 MG/0.8ML injection, Inject 0.8 mLs (120 mg total) into the skin daily., Disp: 24 mL, Rfl: 12   hydrocortisone 2.5 % cream, Apply 1 Application topically daily as needed., Disp: , Rfl:    MYRBETRIQ  25 MG TB24 tablet, Take 1 tablet (25 mg total) by mouth daily., Disp: 90 tablet, Rfl: 3   ondansetron  (ZOFRAN ) 8 MG tablet, Take 1 tablet (8 mg total) by mouth every 8 (eight) hours as needed., Disp: 20 tablet, Rfl: 2   prochlorperazine  (COMPAZINE ) 10 MG tablet, Take 1 tablet (10 mg total) by mouth every 6 (six) hours as needed for nausea or vomiting., Disp: 30 tablet, Rfl: 2   rOPINIRole  (REQUIP ) 0.25 MG tablet, Take 1 tablet (0.25 mg total) by mouth 3 (three) times daily. Take 1 tablet 1 hour before bedtime.  In 3 days, take 2 tablets 1 hour before bedtime.  In 1 week take 4 tablets 1 hour before bedtime, Disp: 60 tablet, Rfl: 2   Sodium Chloride  Flush (NORMAL SALINE FLUSH) 0.9 % SOLN, Inject 10 mLs into the PICC line daily., Disp: 300 mL, Rfl: 1   SUMAtriptan  (IMITREX ) 25 MG tablet, TAKE ONE TABLET BY MOUTH AT  ONSET OF HEADACHE. MAY REPEAT DOSE WITH ONE TABLET IN TWO HOURS IF NEEDED. DO NOT EXCEED TWO TABLETS IN 24 HOURS., Disp: 12 tablet, Rfl: 0   tadalafil  (CIALIS ) 5 MG tablet, Take 1 tablet (5 mg total) by mouth daily., Disp: 90 tablet, Rfl: 3   venetoclax  (VENCLEXTA ) 100 MG tablet, Take 4 tablets (400 mg total) by mouth daily. Tablets should be swallowed whole with a meal and a full glass of water., Disp: 40 tablet, Rfl: 0 No current facility-administered medications for this visit.  Facility-Administered Medications Ordered in Other Visits:    prochlorperazine  (COMPAZINE ) tablet 10 mg, 10 mg, Oral, Once, Adiba Fargnoli, Maude SAUNDERS, MD  Allergies:  Allergies  Allergen Reactions   Codeine  Rash, Dermatitis and Other (See Comments)    ...   Terbinafine Itching    Past Medical History, Surgical history,  Social history, and Family History were reviewed and updated.  Review of Systems: Review of Systems  Constitutional: Negative.   HENT:  Negative.    Eyes: Negative.   Respiratory: Negative.    Cardiovascular: Negative.   Gastrointestinal: Negative.   Endocrine: Negative.   Genitourinary: Negative.    Musculoskeletal: Negative.   Skin: Negative.   Neurological: Negative.   Hematological: Negative.   Psychiatric/Behavioral: Negative.      Physical Exam:  height is 5' 11 (1.803 m) and weight is 162 lb (73.5 kg). His oral temperature is 97.8 F (36.6 C). His blood pressure is 124/59 (abnormal) and his pulse is 59 (abnormal). His respiration is 16 and oxygen saturation is 100%.   Wt Readings from Last 3 Encounters:  12/07/23 162 lb (73.5 kg)  11/09/23 164 lb (74.4 kg)  10/05/23 165 lb 6.4 oz (75 kg)    Physical Exam Vitals reviewed.  HENT:     Head: Normocephalic and atraumatic.  Eyes:     Pupils: Pupils are equal, round, and reactive to light.     Comments: Ocular exam does show the conjunctival hemorrhage with the right eye.  Pupil reacts appropriately.  He has good extraocular muscle movement.  Cardiovascular:     Rate and Rhythm: Normal rate and regular rhythm.     Heart sounds: Normal heart sounds.     Comments: Cardiac exam shows a regular rate and rhythm.  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  I hear no murmurs or rubs or bruits. Pulmonary:     Effort: Pulmonary effort is normal.     Breath sounds: Normal breath sounds.  Abdominal:     General: Bowel sounds are normal.     Palpations: Abdomen is soft.  Musculoskeletal:        General: No tenderness or deformity. Normal range of motion.     Cervical back: Normal range of motion.  Lymphadenopathy:     Cervical: No cervical adenopathy.  Skin:    General: Skin is warm and dry.     Findings: No erythema or rash.  Neurological:     Mental Status: He is alert and oriented to person,  place, and time.  Psychiatric:        Behavior: Behavior normal.        Thought Content: Thought content normal.        Judgment: Judgment normal.      Lab Results  Component Value Date   WBC 1.9 (L) 12/07/2023   HGB 8.8 (L) 12/07/2023   HCT 26.0 (L) 12/07/2023   MCV 95.6 12/07/2023   PLT 29 (L) 12/07/2023  Chemistry      Component Value Date/Time   NA 140 12/07/2023 0919   NA 139 11/12/2020 1030   K 4.3 12/07/2023 0919   CL 106 12/07/2023 0919   CO2 23 12/07/2023 0919   BUN 12 12/07/2023 0919   BUN 16 11/12/2020 1030   CREATININE 1.11 12/07/2023 0919      Component Value Date/Time   CALCIUM  9.7 12/07/2023 0919   ALKPHOS 72 12/07/2023 0919   AST 19 12/07/2023 0919   ALT 16 12/07/2023 0919   BILITOT 0.6 12/07/2023 0919       Impression and Plan: Mr. Raper is a 83 year old white male.  He has mild leukopenia and thrombocytopenia. I did do a bone marrow biopsy on him.  This confirmed that he has myelodysplasia.  Again, I would classify him as refractory anemia with excess blast-1  (RAEB-1).  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  This was proven by bone marrow biopsy done at [facility].  He underwent protocol treatment down at [facility].  He responded incredibly well.  He is in remission right now.  He completed his most recent cycle of treatment appear about a week or so ago.  He currently is on antibiotic prophylaxis.  His white cell count is coming back up.  His platelet count has gone down which I am not surprised.  I think this is some the we just have to watch.  He needs to have blood work done twice a week for right now.  I will see about having to come back on Thursday.  It will be interesting to see how things go formed out at [facility].  I think is going to have another bone marrow test down there.  I had to believe that he is going to be in remission.  I am sure that we will get him started on his next cycle of chemotherapy.  I think he  will alternate to different protocols.  We will have to adjust his Lovenox  dose based on his platelet count.  I will plan to see him back when he is going to start his next cycle.   Maude JONELLE Crease, MD 10/13/202510:30 AM

## 2023-12-07 NOTE — Addendum Note (Signed)
 Addended by: FRANKY NORLEEN BROCKS on: 12/07/2023 11:17 AM   Modules accepted: Orders

## 2023-12-07 NOTE — Patient Instructions (Signed)
 PICC Home Care Guide A peripherally inserted central catheter (PICC) is a form of IV access that allows medicines and IV fluids to be quickly put into the blood and spread throughout the body. The PICC is a long, thin, flexible tube (catheter) that is put into a vein in a person's arm or leg. The catheter ends in a large vein just outside the heart called the superior vena cava (SVC). After the PICC is put in, a chest X-ray may be done to make sure that it is in the right place. A PICC may be placed for different reasons, such as: To give medicines and liquid nutrition. To give IV fluids and blood products. To take blood samples often. If there is trouble placing a peripheral intravenous (PIV) catheter. If cared for properly, a PICC can remain in place for many months. Having a PICC can allow you to go home from the hospital sooner and continue treatment at home. Medicines and PICC care can be managed at home by a family member, caregiver, or home health care team. What are the risks? Generally, having a PICC is safe. However, problems may occur, including: A blood clot (thrombus) forming in or at the end of the PICC. A blood clot forming in a vein (deep vein thrombosis) or traveling to the lung (pulmonary embolism). Inflammation of the vein (phlebitis) in which the PICC is placed. Infection at the insertion site or in the blood. Blood infections from central lines, like PICCs, can be serious and often require a hospital stay. PICC malposition, or PICC movement or poor placement. A break or cut in the PICC. Do not use scissors near the PICC. Nerve or tendon irritation or injury during PICC insertion. How to care for your PICC Please follow the specific guidelines provided by your health care provider. Preventing infection You and any caregivers should wash your hands often with soap and water for at least 20 seconds. Wash hands: Before touching the PICC or the infusion device. Before changing a  bandage (dressing). Do not change the dressing unless you have been taught to do so and have shown you are able to change it safely. Flush the PICC as told. Tell your health care provider right away if the PICC is hard to flush or does not flush. Do not use force to flush the PICC. Use clean and germ-free (sterile) supplies only. Keep the supplies in a dry place. Do not reuse needles, syringes, or any other supplies. Reusing supplies can lead to infection. Keep the PICC dressing dry and secure it with tape if the edges stop sticking to your skin. Check your PICC insertion site every day for signs of infection. Check for: Redness, swelling, or pain. Fluid or blood. Warmth. Pus or a bad smell. Preventing other problems Do not use a syringe that is less than 10 mL to flush the PICC. Do not have your blood pressure checked on the arm in which the PICC is placed. Do not ever pull or tug on the PICC. Keep it secured to your arm with tape or a stretch wrap when not in use. Do not take the PICC out yourself. Only a trained health care provider should remove the PICC. Keep pets and children away from your PICC. How to care for your PICC dressing Keep your PICC dressing clean and dry to prevent infection. Do not take baths, swim, or use a hot tub until your health care provider approves. Ask your health care provider if you can take  showers. You may only be allowed to take sponge baths. When you are allowed to shower: Ask your health care provider to teach you how to wrap the PICC. Cover the PICC with clear plastic wrap and tape to keep it dry while showering. Follow instructions from your health care provider about how to take care of your insertion site and dressing. Make sure you: Wash your hands with soap and water for at least 20 seconds before and after you change your dressing. If soap and water are not available, use hand sanitizer. Change your dressing only if taught to do so by your health care  provider. Your PICC dressing needs to be changed if it becomes loose or wet. Leave stitches (sutures), skin glue, or adhesive strips in place. These skin closures may need to stay in place for 2 weeks or longer. If adhesive strip edges start to loosen and curl up, you may trim the loose edges. Do not remove adhesive strips completely unless your health care provider tells you to do that. Follow these instructions at home: Disposal of supplies Throw away any syringes in a disposal container that is meant for sharp items (sharps container). You can buy a sharps container from a pharmacy, or you can make one by using an empty, hard plastic bottle with a lid. Place any used dressings or infusion bags into a plastic bag. Throw that bag in the trash. General instructions  Always carry your PICC identification card or wear a medical alert bracelet. Keep the tube clamped at all times, unless it is being used. Always carry a smooth-edge clamp with you to clamp the PICC if it breaks. Do not use scissors or sharp objects near the tube. You may bend your arm and move it freely. If your PICC is near or at the bend of your elbow, avoid activity with repeated motion at the elbow. Avoid lifting heavy objects as told by your health care provider. Keep all follow-up visits. This is important. You will need to have your PICC dressing changed at least once a week. Contact a health care provider if: You have pain in your arm, ear, face, or teeth. You have a fever or chills. You have redness, swelling, or pain around the insertion site. You have fluid or blood coming from the insertion site. Your insertion site feels warm to the touch. You have pus or a bad smell coming from the insertion site. Your skin feels hard and raised around the insertion site. Your PICC dressing has gotten wet or is coming off and you have not been taught how to change it. Get help right away if: You have problems with your PICC, such as  your PICC: Was tugged or pulled and has partially come out. Do not  push the PICC back in. Cannot be flushed, is hard to flush, or leaks around the insertion site when it is flushed. Makes a flushing sound when it is flushed. Appears to have a hole or tear. Is accidentally pulled all the way out. If this happens, cover the insertion site with a gauze dressing. Do not throw the PICC away. Your health care provider will need to check it to be sure the entire catheter came out. You feel your heart racing or skipping beats, or you have chest pain. You have shortness of breath or trouble breathing. You have swelling, redness, warmth, or pain in the arm in which the PICC is placed. You have a red streak going up your arm that  starts under the PICC dressing. These symptoms may be an emergency. Get help right away. Call 911. Do not wait to see if the symptoms will go away. Do not drive yourself to the hospital. Summary A peripherally inserted central catheter (PICC) is a long, thin, flexible tube (catheter) that is put into a vein in the arm or leg. If cared for properly, a PICC can remain in place for many months. Having a PICC can allow you to go home from the hospital sooner and continue treatment at home. The PICC is inserted using a germ-free (sterile) technique by a specially trained health care provider. Only a trained health care provider should remove it. Do not have your blood pressure checked on the arm in which your PICC is placed. Always keep your PICC identification card with you. This information is not intended to replace advice given to you by your health care provider. Make sure you discuss any questions you have with your health care provider. Document Revised: 08/29/2020 Document Reviewed: 08/29/2020 Elsevier Patient Education  2024 [employer].

## 2023-12-08 ENCOUNTER — Other Ambulatory Visit: Payer: Self-pay

## 2023-12-09 ENCOUNTER — Encounter: Payer: Self-pay | Admitting: Hematology & Oncology

## 2023-12-09 ENCOUNTER — Other Ambulatory Visit: Payer: Self-pay

## 2023-12-09 MED ORDER — CIPROFLOXACIN HCL 500 MG PO TABS
500.0000 mg | ORAL_TABLET | Freq: Two times a day (BID) | ORAL | 0 refills | Status: AC
Start: 2023-12-09 — End: 2023-12-16

## 2023-12-09 MED ORDER — METRONIDAZOLE 500 MG PO TABS: 500.0000 mg | ORAL_TABLET | Freq: Three times a day (TID) | ORAL | 0 refills | Status: AC

## 2023-12-10 ENCOUNTER — Inpatient Hospital Stay

## 2023-12-10 ENCOUNTER — Ambulatory Visit: Payer: Self-pay | Admitting: Hematology & Oncology

## 2023-12-10 DIAGNOSIS — D4621 Refractory anemia with excess of blasts 1: Secondary | ICD-10-CM

## 2023-12-10 DIAGNOSIS — D46Z Other myelodysplastic syndromes: Secondary | ICD-10-CM

## 2023-12-10 DIAGNOSIS — Z5111 Encounter for antineoplastic chemotherapy: Secondary | ICD-10-CM | POA: Diagnosis not present

## 2023-12-10 DIAGNOSIS — Z952 Presence of prosthetic heart valve: Secondary | ICD-10-CM | POA: Diagnosis not present

## 2023-12-10 DIAGNOSIS — C9200 Acute myeloblastic leukemia, not having achieved remission: Secondary | ICD-10-CM | POA: Diagnosis not present

## 2023-12-10 DIAGNOSIS — Z79899 Other long term (current) drug therapy: Secondary | ICD-10-CM | POA: Diagnosis not present

## 2023-12-10 DIAGNOSIS — Z7901 Long term (current) use of anticoagulants: Secondary | ICD-10-CM | POA: Diagnosis not present

## 2023-12-10 LAB — CBC WITH DIFFERENTIAL (CANCER CENTER ONLY)
Abs Immature Granulocytes: 0.01 K/uL (ref 0.00–0.07)
Basophils Absolute: 0 K/uL (ref 0.0–0.1)
Basophils Relative: 0 %
Eosinophils Absolute: 0 K/uL (ref 0.0–0.5)
Eosinophils Relative: 0 %
HCT: 27.2 % — ABNORMAL LOW (ref 39.0–52.0)
Hemoglobin: 9.3 g/dL — ABNORMAL LOW (ref 13.0–17.0)
Immature Granulocytes: 1 %
Lymphocytes Relative: 11 %
Lymphs Abs: 0.2 K/uL — ABNORMAL LOW (ref 0.7–4.0)
MCH: 33.2 pg (ref 26.0–34.0)
MCHC: 34.2 g/dL (ref 30.0–36.0)
MCV: 97.1 fL (ref 80.0–100.0)
Monocytes Absolute: 0.6 K/uL (ref 0.1–1.0)
Monocytes Relative: 38 %
Neutro Abs: 0.8 K/uL — ABNORMAL LOW (ref 1.7–7.7)
Neutrophils Relative %: 50 %
Platelet Count: 78 K/uL — ABNORMAL LOW (ref 150–400)
RBC: 2.8 MIL/uL — ABNORMAL LOW (ref 4.22–5.81)
RDW: 19.3 % — ABNORMAL HIGH (ref 11.5–15.5)
WBC Count: 1.6 K/uL — ABNORMAL LOW (ref 4.0–10.5)
nRBC: 0 % (ref 0.0–0.2)

## 2023-12-10 LAB — CMP (CANCER CENTER ONLY)
ALT: 14 U/L (ref 0–44)
AST: 18 U/L (ref 15–41)
Albumin: 4.2 g/dL (ref 3.5–5.0)
Alkaline Phosphatase: 72 U/L (ref 38–126)
Anion gap: 11 (ref 5–15)
BUN: 15 mg/dL (ref 8–23)
CO2: 23 mmol/L (ref 22–32)
Calcium: 9.8 mg/dL (ref 8.9–10.3)
Chloride: 105 mmol/L (ref 98–111)
Creatinine: 1.17 mg/dL (ref 0.61–1.24)
GFR, Estimated: >60 mL/min (ref >60)
Glucose, Bld: 112 mg/dL — ABNORMAL HIGH (ref 70–99)
Potassium: 3.8 mmol/L (ref 3.5–5.1)
Sodium: 139 mmol/L (ref 135–145)
Total Bilirubin: 0.6 mg/dL (ref 0.0–1.2)
Total Protein: 6.1 g/dL — ABNORMAL LOW (ref 6.5–8.1)

## 2023-12-10 NOTE — Patient Instructions (Signed)
 PICC Home Care Guide A peripherally inserted central catheter (PICC) is a form of IV access that allows medicines and IV fluids to be quickly put into the blood and spread throughout the body. The PICC is a long, thin, flexible tube (catheter) that is put into a vein in a person's arm or leg. The catheter ends in a large vein just outside the heart called the superior vena cava (SVC). After the PICC is put in, a chest X-ray may be done to make sure that it is in the right place. A PICC may be placed for different reasons, such as: To give medicines and liquid nutrition. To give IV fluids and blood products. To take blood samples often. If there is trouble placing a peripheral intravenous (PIV) catheter. If cared for properly, a PICC can remain in place for many months. Having a PICC can allow you to go home from the hospital sooner and continue treatment at home. Medicines and PICC care can be managed at home by a family member, caregiver, or home health care team. What are the risks? Generally, having a PICC is safe. However, problems may occur, including: A blood clot (thrombus) forming in or at the end of the PICC. A blood clot forming in a vein (deep vein thrombosis) or traveling to the lung (pulmonary embolism). Inflammation of the vein (phlebitis) in which the PICC is placed. Infection at the insertion site or in the blood. Blood infections from central lines, like PICCs, can be serious and often require a hospital stay. PICC malposition, or PICC movement or poor placement. A break or cut in the PICC. Do not use scissors near the PICC. Nerve or tendon irritation or injury during PICC insertion. How to care for your PICC Please follow the specific guidelines provided by your health care provider. Preventing infection You and any caregivers should wash your hands often with soap and water for at least 20 seconds. Wash hands: Before touching the PICC or the infusion device. Before changing a  bandage (dressing). Do not change the dressing unless you have been taught to do so and have shown you are able to change it safely. Flush the PICC as told. Tell your health care provider right away if the PICC is hard to flush or does not flush. Do not use force to flush the PICC. Use clean and germ-free (sterile) supplies only. Keep the supplies in a dry place. Do not reuse needles, syringes, or any other supplies. Reusing supplies can lead to infection. Keep the PICC dressing dry and secure it with tape if the edges stop sticking to your skin. Check your PICC insertion site every day for signs of infection. Check for: Redness, swelling, or pain. Fluid or blood. Warmth. Pus or a bad smell. Preventing other problems Do not use a syringe that is less than 10 mL to flush the PICC. Do not have your blood pressure checked on the arm in which the PICC is placed. Do not ever pull or tug on the PICC. Keep it secured to your arm with tape or a stretch wrap when not in use. Do not take the PICC out yourself. Only a trained health care provider should remove the PICC. Keep pets and children away from your PICC. How to care for your PICC dressing Keep your PICC dressing clean and dry to prevent infection. Do not take baths, swim, or use a hot tub until your health care provider approves. Ask your health care provider if you can take  showers. You may only be allowed to take sponge baths. When you are allowed to shower: Ask your health care provider to teach you how to wrap the PICC. Cover the PICC with clear plastic wrap and tape to keep it dry while showering. Follow instructions from your health care provider about how to take care of your insertion site and dressing. Make sure you: Wash your hands with soap and water for at least 20 seconds before and after you change your dressing. If soap and water are not available, use hand sanitizer. Change your dressing only if taught to do so by your health care  provider. Your PICC dressing needs to be changed if it becomes loose or wet. Leave stitches (sutures), skin glue, or adhesive strips in place. These skin closures may need to stay in place for 2 weeks or longer. If adhesive strip edges start to loosen and curl up, you may trim the loose edges. Do not remove adhesive strips completely unless your health care provider tells you to do that. Follow these instructions at home: Disposal of supplies Throw away any syringes in a disposal container that is meant for sharp items (sharps container). You can buy a sharps container from a pharmacy, or you can make one by using an empty, hard plastic bottle with a lid. Place any used dressings or infusion bags into a plastic bag. Throw that bag in the trash. General instructions  Always carry your PICC identification card or wear a medical alert bracelet. Keep the tube clamped at all times, unless it is being used. Always carry a smooth-edge clamp with you to clamp the PICC if it breaks. Do not use scissors or sharp objects near the tube. You may bend your arm and move it freely. If your PICC is near or at the bend of your elbow, avoid activity with repeated motion at the elbow. Avoid lifting heavy objects as told by your health care provider. Keep all follow-up visits. This is important. You will need to have your PICC dressing changed at least once a week. Contact a health care provider if: You have pain in your arm, ear, face, or teeth. You have a fever or chills. You have redness, swelling, or pain around the insertion site. You have fluid or blood coming from the insertion site. Your insertion site feels warm to the touch. You have pus or a bad smell coming from the insertion site. Your skin feels hard and raised around the insertion site. Your PICC dressing has gotten wet or is coming off and you have not been taught how to change it. Get help right away if: You have problems with your PICC, such as  your PICC: Was tugged or pulled and has partially come out. Do not  push the PICC back in. Cannot be flushed, is hard to flush, or leaks around the insertion site when it is flushed. Makes a flushing sound when it is flushed. Appears to have a hole or tear. Is accidentally pulled all the way out. If this happens, cover the insertion site with a gauze dressing. Do not throw the PICC away. Your health care provider will need to check it to be sure the entire catheter came out. You feel your heart racing or skipping beats, or you have chest pain. You have shortness of breath or trouble breathing. You have swelling, redness, warmth, or pain in the arm in which the PICC is placed. You have a red streak going up your arm that  starts under the PICC dressing. These symptoms may be an emergency. Get help right away. Call 911. Do not wait to see if the symptoms will go away. Do not drive yourself to the hospital. Summary A peripherally inserted central catheter (PICC) is a long, thin, flexible tube (catheter) that is put into a vein in the arm or leg. If cared for properly, a PICC can remain in place for many months. Having a PICC can allow you to go home from the hospital sooner and continue treatment at home. The PICC is inserted using a germ-free (sterile) technique by a specially trained health care provider. Only a trained health care provider should remove it. Do not have your blood pressure checked on the arm in which your PICC is placed. Always keep your PICC identification card with you. This information is not intended to replace advice given to you by your health care provider. Make sure you discuss any questions you have with your health care provider. Document Revised: 08/29/2020 Document Reviewed: 08/29/2020 Elsevier Patient Education  2024 [employer].

## 2023-12-11 ENCOUNTER — Encounter: Payer: Self-pay | Admitting: Hematology & Oncology

## 2023-12-14 ENCOUNTER — Other Ambulatory Visit: Payer: Self-pay | Admitting: Hematology & Oncology

## 2023-12-14 ENCOUNTER — Encounter: Payer: Self-pay | Admitting: Hematology & Oncology

## 2023-12-14 ENCOUNTER — Inpatient Hospital Stay

## 2023-12-14 DIAGNOSIS — Z5111 Encounter for antineoplastic chemotherapy: Secondary | ICD-10-CM | POA: Diagnosis not present

## 2023-12-14 DIAGNOSIS — D46Z Other myelodysplastic syndromes: Secondary | ICD-10-CM

## 2023-12-14 DIAGNOSIS — D708 Other neutropenia: Secondary | ICD-10-CM

## 2023-12-14 DIAGNOSIS — Z7901 Long term (current) use of anticoagulants: Secondary | ICD-10-CM | POA: Diagnosis not present

## 2023-12-14 DIAGNOSIS — D709 Neutropenia, unspecified: Secondary | ICD-10-CM

## 2023-12-14 DIAGNOSIS — D72810 Lymphocytopenia: Secondary | ICD-10-CM

## 2023-12-14 DIAGNOSIS — C9200 Acute myeloblastic leukemia, not having achieved remission: Secondary | ICD-10-CM | POA: Diagnosis not present

## 2023-12-14 DIAGNOSIS — Z79899 Other long term (current) drug therapy: Secondary | ICD-10-CM | POA: Diagnosis not present

## 2023-12-14 DIAGNOSIS — Z952 Presence of prosthetic heart valve: Secondary | ICD-10-CM | POA: Diagnosis not present

## 2023-12-14 DIAGNOSIS — D696 Thrombocytopenia, unspecified: Secondary | ICD-10-CM

## 2023-12-14 DIAGNOSIS — D4621 Refractory anemia with excess of blasts 1: Secondary | ICD-10-CM

## 2023-12-14 LAB — CBC WITH DIFFERENTIAL (CANCER CENTER ONLY)
Abs Immature Granulocytes: 0.01 K/uL (ref 0.00–0.07)
Basophils Absolute: 0 K/uL (ref 0.0–0.1)
Basophils Relative: 1 %
Eosinophils Absolute: 0 K/uL (ref 0.0–0.5)
Eosinophils Relative: 1 %
HCT: 28.4 % — ABNORMAL LOW (ref 39.0–52.0)
Hemoglobin: 9.5 g/dL — ABNORMAL LOW (ref 13.0–17.0)
Immature Granulocytes: 1 %
Lymphocytes Relative: 10 %
Lymphs Abs: 0.2 K/uL — ABNORMAL LOW (ref 0.7–4.0)
MCH: 33.2 pg (ref 26.0–34.0)
MCHC: 33.5 g/dL (ref 30.0–36.0)
MCV: 99.3 fL (ref 80.0–100.0)
Monocytes Absolute: 0.4 K/uL (ref 0.1–1.0)
Monocytes Relative: 28 %
Neutro Abs: 0.9 K/uL — ABNORMAL LOW (ref 1.7–7.7)
Neutrophils Relative %: 59 %
Platelet Count: 94 K/uL — ABNORMAL LOW (ref 150–400)
RBC: 2.86 MIL/uL — ABNORMAL LOW (ref 4.22–5.81)
RDW: 21.4 % — ABNORMAL HIGH (ref 11.5–15.5)
WBC Count: 1.5 K/uL — ABNORMAL LOW (ref 4.0–10.5)
nRBC: 0 % (ref 0.0–0.2)

## 2023-12-14 LAB — CMP (CANCER CENTER ONLY)
ALT: 17 U/L (ref 0–44)
AST: 20 U/L (ref 15–41)
Albumin: 4.1 g/dL (ref 3.5–5.0)
Alkaline Phosphatase: 69 U/L (ref 38–126)
Anion gap: 10 (ref 5–15)
BUN: 12 mg/dL (ref 8–23)
CO2: 25 mmol/L (ref 22–32)
Calcium: 9.7 mg/dL (ref 8.9–10.3)
Chloride: 107 mmol/L (ref 98–111)
Creatinine: 1.11 mg/dL (ref 0.61–1.24)
GFR, Estimated: >60 mL/min (ref >60)
Glucose, Bld: 139 mg/dL — ABNORMAL HIGH (ref 70–99)
Potassium: 4 mmol/L (ref 3.5–5.1)
Sodium: 143 mmol/L (ref 135–145)
Total Bilirubin: 0.5 mg/dL (ref 0.0–1.2)
Total Protein: 6 g/dL — ABNORMAL LOW (ref 6.5–8.1)

## 2023-12-14 LAB — SAMPLE TO BLOOD BANK

## 2023-12-14 LAB — PROTIME-INR
INR: 1.1 (ref 0.8–1.2)
Prothrombin Time: 14.8 s (ref 11.4–15.2)

## 2023-12-14 LAB — URIC ACID: Uric Acid, Serum: 4.1 mg/dL (ref 3.7–8.6)

## 2023-12-14 LAB — PHOSPHORUS: Phosphorus: 2.3 mg/dL — ABNORMAL LOW (ref 2.5–4.6)

## 2023-12-14 LAB — MAGNESIUM: Magnesium: 2 mg/dL (ref 1.7–2.4)

## 2023-12-14 LAB — D-DIMER, QUANTITATIVE: D-Dimer, Quant: 0.9 ug{FEU}/mL — ABNORMAL HIGH (ref 0.00–0.50)

## 2023-12-14 LAB — FIBRINOGEN: Fibrinogen: 357 mg/dL (ref 210–475)

## 2023-12-14 LAB — LACTATE DEHYDROGENASE: LDH: 264 U/L — ABNORMAL HIGH (ref 98–192)

## 2023-12-14 LAB — APTT: aPTT: 33 s (ref 24–36)

## 2023-12-14 NOTE — Telephone Encounter (Signed)
 Dr. Jessy nurse requesting call back, Call back # [phone], Joellen- RN

## 2023-12-14 NOTE — Patient Instructions (Signed)
 PICC Home Care Guide A peripherally inserted central catheter (PICC) is a form of IV access that allows medicines and IV fluids to be quickly put into the blood and spread throughout the body. The PICC is a long, thin, flexible tube (catheter) that is put into a vein in a person's arm or leg. The catheter ends in a large vein just outside the heart called the superior vena cava (SVC). After the PICC is put in, a chest X-ray may be done to make sure that it is in the right place. A PICC may be placed for different reasons, such as: To give medicines and liquid nutrition. To give IV fluids and blood products. To take blood samples often. If there is trouble placing a peripheral intravenous (PIV) catheter. If cared for properly, a PICC can remain in place for many months. Having a PICC can allow you to go home from the hospital sooner and continue treatment at home. Medicines and PICC care can be managed at home by a family member, caregiver, or home health care team. What are the risks? Generally, having a PICC is safe. However, problems may occur, including: A blood clot (thrombus) forming in or at the end of the PICC. A blood clot forming in a vein (deep vein thrombosis) or traveling to the lung (pulmonary embolism). Inflammation of the vein (phlebitis) in which the PICC is placed. Infection at the insertion site or in the blood. Blood infections from central lines, like PICCs, can be serious and often require a hospital stay. PICC malposition, or PICC movement or poor placement. A break or cut in the PICC. Do not use scissors near the PICC. Nerve or tendon irritation or injury during PICC insertion. How to care for your PICC Please follow the specific guidelines provided by your health care provider. Preventing infection You and any caregivers should wash your hands often with soap and water for at least 20 seconds. Wash hands: Before touching the PICC or the infusion device. Before changing a  bandage (dressing). Do not change the dressing unless you have been taught to do so and have shown you are able to change it safely. Flush the PICC as told. Tell your health care provider right away if the PICC is hard to flush or does not flush. Do not use force to flush the PICC. Use clean and germ-free (sterile) supplies only. Keep the supplies in a dry place. Do not reuse needles, syringes, or any other supplies. Reusing supplies can lead to infection. Keep the PICC dressing dry and secure it with tape if the edges stop sticking to your skin. Check your PICC insertion site every day for signs of infection. Check for: Redness, swelling, or pain. Fluid or blood. Warmth. Pus or a bad smell. Preventing other problems Do not use a syringe that is less than 10 mL to flush the PICC. Do not have your blood pressure checked on the arm in which the PICC is placed. Do not ever pull or tug on the PICC. Keep it secured to your arm with tape or a stretch wrap when not in use. Do not take the PICC out yourself. Only a trained health care provider should remove the PICC. Keep pets and children away from your PICC. How to care for your PICC dressing Keep your PICC dressing clean and dry to prevent infection. Do not take baths, swim, or use a hot tub until your health care provider approves. Ask your health care provider if you can take  showers. You may only be allowed to take sponge baths. When you are allowed to shower: Ask your health care provider to teach you how to wrap the PICC. Cover the PICC with clear plastic wrap and tape to keep it dry while showering. Follow instructions from your health care provider about how to take care of your insertion site and dressing. Make sure you: Wash your hands with soap and water for at least 20 seconds before and after you change your dressing. If soap and water are not available, use hand sanitizer. Change your dressing only if taught to do so by your health care  provider. Your PICC dressing needs to be changed if it becomes loose or wet. Leave stitches (sutures), skin glue, or adhesive strips in place. These skin closures may need to stay in place for 2 weeks or longer. If adhesive strip edges start to loosen and curl up, you may trim the loose edges. Do not remove adhesive strips completely unless your health care provider tells you to do that. Follow these instructions at home: Disposal of supplies Throw away any syringes in a disposal container that is meant for sharp items (sharps container). You can buy a sharps container from a pharmacy, or you can make one by using an empty, hard plastic bottle with a lid. Place any used dressings or infusion bags into a plastic bag. Throw that bag in the trash. General instructions  Always carry your PICC identification card or wear a medical alert bracelet. Keep the tube clamped at all times, unless it is being used. Always carry a smooth-edge clamp with you to clamp the PICC if it breaks. Do not use scissors or sharp objects near the tube. You may bend your arm and move it freely. If your PICC is near or at the bend of your elbow, avoid activity with repeated motion at the elbow. Avoid lifting heavy objects as told by your health care provider. Keep all follow-up visits. This is important. You will need to have your PICC dressing changed at least once a week. Contact a health care provider if: You have pain in your arm, ear, face, or teeth. You have a fever or chills. You have redness, swelling, or pain around the insertion site. You have fluid or blood coming from the insertion site. Your insertion site feels warm to the touch. You have pus or a bad smell coming from the insertion site. Your skin feels hard and raised around the insertion site. Your PICC dressing has gotten wet or is coming off and you have not been taught how to change it. Get help right away if: You have problems with your PICC, such as  your PICC: Was tugged or pulled and has partially come out. Do not  push the PICC back in. Cannot be flushed, is hard to flush, or leaks around the insertion site when it is flushed. Makes a flushing sound when it is flushed. Appears to have a hole or tear. Is accidentally pulled all the way out. If this happens, cover the insertion site with a gauze dressing. Do not throw the PICC away. Your health care provider will need to check it to be sure the entire catheter came out. You feel your heart racing or skipping beats, or you have chest pain. You have shortness of breath or trouble breathing. You have swelling, redness, warmth, or pain in the arm in which the PICC is placed. You have a red streak going up your arm that  starts under the PICC dressing. These symptoms may be an emergency. Get help right away. Call 911. Do not wait to see if the symptoms will go away. Do not drive yourself to the hospital. Summary A peripherally inserted central catheter (PICC) is a long, thin, flexible tube (catheter) that is put into a vein in the arm or leg. If cared for properly, a PICC can remain in place for many months. Having a PICC can allow you to go home from the hospital sooner and continue treatment at home. The PICC is inserted using a germ-free (sterile) technique by a specially trained health care provider. Only a trained health care provider should remove it. Do not have your blood pressure checked on the arm in which your PICC is placed. Always keep your PICC identification card with you. This information is not intended to replace advice given to you by your health care provider. Make sure you discuss any questions you have with your health care provider. Document Revised: 08/29/2020 Document Reviewed: 08/29/2020 Elsevier Patient Education  2024 [employer].

## 2023-12-15 ENCOUNTER — Other Ambulatory Visit: Payer: Self-pay | Admitting: Hematology & Oncology

## 2023-12-15 ENCOUNTER — Encounter: Payer: Self-pay | Admitting: Hematology & Oncology

## 2023-12-15 DIAGNOSIS — D4621 Refractory anemia with excess of blasts 1: Secondary | ICD-10-CM

## 2023-12-15 DIAGNOSIS — C9201 Acute myeloblastic leukemia, in remission: Secondary | ICD-10-CM

## 2023-12-15 DIAGNOSIS — D46Z Other myelodysplastic syndromes: Secondary | ICD-10-CM

## 2023-12-16 ENCOUNTER — Other Ambulatory Visit: Payer: Self-pay

## 2023-12-16 ENCOUNTER — Other Ambulatory Visit: Payer: Self-pay | Admitting: Medical Oncology

## 2023-12-16 ENCOUNTER — Encounter: Payer: Self-pay | Admitting: Hematology & Oncology

## 2023-12-16 DIAGNOSIS — C9201 Acute myeloblastic leukemia, in remission: Secondary | ICD-10-CM

## 2023-12-16 MED ORDER — VENETOCLAX 50 MG PO TABS: 50.0000 mg | ORAL_TABLET | Freq: Every day | ORAL | 0 refills | Status: DC

## 2023-12-16 MED ORDER — VENETOCLAX 50 MG PO TABS
50.0000 mg | ORAL_TABLET | Freq: Every day | ORAL | 0 refills | Status: DC
  Filled 2023-12-16 (×2): qty 28, 28d supply, fill #0

## 2023-12-17 ENCOUNTER — Other Ambulatory Visit: Payer: Self-pay | Admitting: *Deleted

## 2023-12-17 ENCOUNTER — Inpatient Hospital Stay

## 2023-12-17 ENCOUNTER — Encounter: Payer: Self-pay | Admitting: Hematology & Oncology

## 2023-12-17 ENCOUNTER — Telehealth: Payer: Self-pay | Admitting: *Deleted

## 2023-12-17 DIAGNOSIS — D709 Neutropenia, unspecified: Secondary | ICD-10-CM

## 2023-12-17 DIAGNOSIS — D696 Thrombocytopenia, unspecified: Secondary | ICD-10-CM

## 2023-12-17 DIAGNOSIS — D72810 Lymphocytopenia: Secondary | ICD-10-CM

## 2023-12-17 DIAGNOSIS — C9201 Acute myeloblastic leukemia, in remission: Secondary | ICD-10-CM

## 2023-12-17 DIAGNOSIS — D46Z Other myelodysplastic syndromes: Secondary | ICD-10-CM

## 2023-12-17 DIAGNOSIS — R8290 Unspecified abnormal findings in urine: Secondary | ICD-10-CM

## 2023-12-17 DIAGNOSIS — Z7901 Long term (current) use of anticoagulants: Secondary | ICD-10-CM | POA: Diagnosis not present

## 2023-12-17 DIAGNOSIS — Z5111 Encounter for antineoplastic chemotherapy: Secondary | ICD-10-CM | POA: Diagnosis not present

## 2023-12-17 DIAGNOSIS — D4621 Refractory anemia with excess of blasts 1: Secondary | ICD-10-CM

## 2023-12-17 DIAGNOSIS — Z952 Presence of prosthetic heart valve: Secondary | ICD-10-CM | POA: Diagnosis not present

## 2023-12-17 DIAGNOSIS — C9200 Acute myeloblastic leukemia, not having achieved remission: Secondary | ICD-10-CM | POA: Diagnosis not present

## 2023-12-17 DIAGNOSIS — R309 Painful micturition, unspecified: Secondary | ICD-10-CM

## 2023-12-17 DIAGNOSIS — D708 Other neutropenia: Secondary | ICD-10-CM

## 2023-12-17 DIAGNOSIS — Z79899 Other long term (current) drug therapy: Secondary | ICD-10-CM | POA: Diagnosis not present

## 2023-12-17 LAB — BASIC METABOLIC PANEL - CANCER CENTER ONLY
Anion gap: 10 (ref 5–15)
BUN: 13 mg/dL (ref 8–23)
CO2: 24 mmol/L (ref 22–32)
Calcium: 9.3 mg/dL (ref 8.9–10.3)
Chloride: 106 mmol/L (ref 98–111)
Creatinine: 0.99 mg/dL (ref 0.61–1.24)
GFR, Estimated: >60 mL/min (ref >60)
Glucose, Bld: 156 mg/dL — ABNORMAL HIGH (ref 70–99)
Potassium: 3.8 mmol/L (ref 3.5–5.1)
Sodium: 140 mmol/L (ref 135–145)

## 2023-12-17 LAB — CBC WITH DIFFERENTIAL (CANCER CENTER ONLY)
Abs Immature Granulocytes: 0.01 K/uL (ref 0.00–0.07)
Basophils Absolute: 0 K/uL (ref 0.0–0.1)
Basophils Relative: 0 %
Eosinophils Absolute: 0 K/uL (ref 0.0–0.5)
Eosinophils Relative: 0 %
HCT: 28.4 % — ABNORMAL LOW (ref 39.0–52.0)
Hemoglobin: 9.5 g/dL — ABNORMAL LOW (ref 13.0–17.0)
Immature Granulocytes: 1 %
Lymphocytes Relative: 9 %
Lymphs Abs: 0.2 K/uL — ABNORMAL LOW (ref 0.7–4.0)
MCH: 33.8 pg (ref 26.0–34.0)
MCHC: 33.5 g/dL (ref 30.0–36.0)
MCV: 101.1 fL — ABNORMAL HIGH (ref 80.0–100.0)
Monocytes Absolute: 0.4 K/uL (ref 0.1–1.0)
Monocytes Relative: 21 %
Neutro Abs: 1.2 K/uL — ABNORMAL LOW (ref 1.7–7.7)
Neutrophils Relative %: 69 %
Platelet Count: 92 K/uL — ABNORMAL LOW (ref 150–400)
RBC: 2.81 MIL/uL — ABNORMAL LOW (ref 4.22–5.81)
RDW: 22.1 % — ABNORMAL HIGH (ref 11.5–15.5)
WBC Count: 1.8 K/uL — ABNORMAL LOW (ref 4.0–10.5)
nRBC: 0 % (ref 0.0–0.2)

## 2023-12-17 NOTE — Telephone Encounter (Signed)
 Call received back from Rosaline Perking with [facility] and she states per order of Dr. Laverna, pt is to hold off on treatment with Venetoclax  and Azacitadine next week and to keep appts with [facility] on 12/28/23 and 12/30/23 and that pt is to continue on his anti-viral, anti-fungal and antibiotic medication.  Call then placed to patient to notify him of Dr. Douglass orders.  Pt instructed to keep appts for Monday and Thursday for lab checks.  Neutropenic precautions reviewed with patient.  Teach back done.  Pt is appreciative of call and has no questions at this time and states that he will come in tomorrow morning for CXR to verify PICC placement.

## 2023-12-17 NOTE — Patient Instructions (Signed)
 PICC Home Care Guide A peripherally inserted central catheter (PICC) is a form of IV access that allows medicines and IV fluids to be quickly put into the blood and spread throughout the body. The PICC is a long, thin, flexible tube (catheter) that is put into a vein in a person's arm or leg. The catheter ends in a large vein just outside the heart called the superior vena cava (SVC). After the PICC is put in, a chest X-ray may be done to make sure that it is in the right place. A PICC may be placed for different reasons, such as: To give medicines and liquid nutrition. To give IV fluids and blood products. To take blood samples often. If there is trouble placing a peripheral intravenous (PIV) catheter. If cared for properly, a PICC can remain in place for many months. Having a PICC can allow you to go home from the hospital sooner and continue treatment at home. Medicines and PICC care can be managed at home by a family member, caregiver, or home health care team. What are the risks? Generally, having a PICC is safe. However, problems may occur, including: A blood clot (thrombus) forming in or at the end of the PICC. A blood clot forming in a vein (deep vein thrombosis) or traveling to the lung (pulmonary embolism). Inflammation of the vein (phlebitis) in which the PICC is placed. Infection at the insertion site or in the blood. Blood infections from central lines, like PICCs, can be serious and often require a hospital stay. PICC malposition, or PICC movement or poor placement. A break or cut in the PICC. Do not use scissors near the PICC. Nerve or tendon irritation or injury during PICC insertion. How to care for your PICC Please follow the specific guidelines provided by your health care provider. Preventing infection You and any caregivers should wash your hands often with soap and water for at least 20 seconds. Wash hands: Before touching the PICC or the infusion device. Before changing a  bandage (dressing). Do not change the dressing unless you have been taught to do so and have shown you are able to change it safely. Flush the PICC as told. Tell your health care provider right away if the PICC is hard to flush or does not flush. Do not use force to flush the PICC. Use clean and germ-free (sterile) supplies only. Keep the supplies in a dry place. Do not reuse needles, syringes, or any other supplies. Reusing supplies can lead to infection. Keep the PICC dressing dry and secure it with tape if the edges stop sticking to your skin. Check your PICC insertion site every day for signs of infection. Check for: Redness, swelling, or pain. Fluid or blood. Warmth. Pus or a bad smell. Preventing other problems Do not use a syringe that is less than 10 mL to flush the PICC. Do not have your blood pressure checked on the arm in which the PICC is placed. Do not ever pull or tug on the PICC. Keep it secured to your arm with tape or a stretch wrap when not in use. Do not take the PICC out yourself. Only a trained health care provider should remove the PICC. Keep pets and children away from your PICC. How to care for your PICC dressing Keep your PICC dressing clean and dry to prevent infection. Do not take baths, swim, or use a hot tub until your health care provider approves. Ask your health care provider if you can take  showers. You may only be allowed to take sponge baths. When you are allowed to shower: Ask your health care provider to teach you how to wrap the PICC. Cover the PICC with clear plastic wrap and tape to keep it dry while showering. Follow instructions from your health care provider about how to take care of your insertion site and dressing. Make sure you: Wash your hands with soap and water for at least 20 seconds before and after you change your dressing. If soap and water are not available, use hand sanitizer. Change your dressing only if taught to do so by your health care  provider. Your PICC dressing needs to be changed if it becomes loose or wet. Leave stitches (sutures), skin glue, or adhesive strips in place. These skin closures may need to stay in place for 2 weeks or longer. If adhesive strip edges start to loosen and curl up, you may trim the loose edges. Do not remove adhesive strips completely unless your health care provider tells you to do that. Follow these instructions at home: Disposal of supplies Throw away any syringes in a disposal container that is meant for sharp items (sharps container). You can buy a sharps container from a pharmacy, or you can make one by using an empty, hard plastic bottle with a lid. Place any used dressings or infusion bags into a plastic bag. Throw that bag in the trash. General instructions  Always carry your PICC identification card or wear a medical alert bracelet. Keep the tube clamped at all times, unless it is being used. Always carry a smooth-edge clamp with you to clamp the PICC if it breaks. Do not use scissors or sharp objects near the tube. You may bend your arm and move it freely. If your PICC is near or at the bend of your elbow, avoid activity with repeated motion at the elbow. Avoid lifting heavy objects as told by your health care provider. Keep all follow-up visits. This is important. You will need to have your PICC dressing changed at least once a week. Contact a health care provider if: You have pain in your arm, ear, face, or teeth. You have a fever or chills. You have redness, swelling, or pain around the insertion site. You have fluid or blood coming from the insertion site. Your insertion site feels warm to the touch. You have pus or a bad smell coming from the insertion site. Your skin feels hard and raised around the insertion site. Your PICC dressing has gotten wet or is coming off and you have not been taught how to change it. Get help right away if: You have problems with your PICC, such as  your PICC: Was tugged or pulled and has partially come out. Do not  push the PICC back in. Cannot be flushed, is hard to flush, or leaks around the insertion site when it is flushed. Makes a flushing sound when it is flushed. Appears to have a hole or tear. Is accidentally pulled all the way out. If this happens, cover the insertion site with a gauze dressing. Do not throw the PICC away. Your health care provider will need to check it to be sure the entire catheter came out. You feel your heart racing or skipping beats, or you have chest pain. You have shortness of breath or trouble breathing. You have swelling, redness, warmth, or pain in the arm in which the PICC is placed. You have a red streak going up your arm that  starts under the PICC dressing. These symptoms may be an emergency. Get help right away. Call 911. Do not wait to see if the symptoms will go away. Do not drive yourself to the hospital. Summary A peripherally inserted central catheter (PICC) is a long, thin, flexible tube (catheter) that is put into a vein in the arm or leg. If cared for properly, a PICC can remain in place for many months. Having a PICC can allow you to go home from the hospital sooner and continue treatment at home. The PICC is inserted using a germ-free (sterile) technique by a specially trained health care provider. Only a trained health care provider should remove it. Do not have your blood pressure checked on the arm in which your PICC is placed. Always keep your PICC identification card with you. This information is not intended to replace advice given to you by your health care provider. Make sure you discuss any questions you have with your health care provider. Document Revised: 08/29/2020 Document Reviewed: 08/29/2020 Elsevier Patient Education  2024 [employer].

## 2023-12-17 NOTE — Telephone Encounter (Signed)
 Call placed to [facility] with [facility] to request confirmation regarding pt.'s treatment plan per Dr. Laverna.  Rosaline states that she will contact Dr. Laverna and call office back with orders.

## 2023-12-17 NOTE — Progress Notes (Signed)
 Pt walked back into infusion room after being discharged from PICC line flush and stated that he is having another reaction.  Pt states that at times after his PICC line flush, he feels numbness and tingling from his fingertips to his shoulder and that once when his wife was flushing his PICC at home, the numbness and tingling went from his fingertips, up his arm, through his neck and to the back of his head.  While speaking with patient (approximately 2 minutes) pt states that the numbness and tingling went away and that he is fine and needs to leave.  Pt refused to stay for further observation. CANDIE Dais PA notified and order received for pt to have a 2 view CXR to verify PICC line tip placement.  Call placed to patient and patient notified of need for CXR.  Pt states that he will come in on 12/18/23 for CXR and is appreciative of assistance.

## 2023-12-18 ENCOUNTER — Ambulatory Visit (HOSPITAL_BASED_OUTPATIENT_CLINIC_OR_DEPARTMENT_OTHER)
Admission: RE | Admit: 2023-12-18 | Discharge: 2023-12-18 | Disposition: A | Discharge status: Home / Self Care | Source: Ambulatory Visit | Attending: Medical Oncology | Admitting: Medical Oncology

## 2023-12-18 DIAGNOSIS — D72810 Lymphocytopenia: Secondary | ICD-10-CM | POA: Insufficient documentation

## 2023-12-18 DIAGNOSIS — C9201 Acute myeloblastic leukemia, in remission: Secondary | ICD-10-CM | POA: Insufficient documentation

## 2023-12-18 DIAGNOSIS — D4621 Refractory anemia with excess of blasts 1: Secondary | ICD-10-CM | POA: Diagnosis not present

## 2023-12-18 DIAGNOSIS — D696 Thrombocytopenia, unspecified: Secondary | ICD-10-CM | POA: Diagnosis not present

## 2023-12-18 DIAGNOSIS — R8290 Unspecified abnormal findings in urine: Secondary | ICD-10-CM | POA: Diagnosis not present

## 2023-12-18 DIAGNOSIS — R309 Painful micturition, unspecified: Secondary | ICD-10-CM | POA: Insufficient documentation

## 2023-12-18 DIAGNOSIS — D708 Other neutropenia: Secondary | ICD-10-CM | POA: Insufficient documentation

## 2023-12-18 DIAGNOSIS — Z452 Encounter for adjustment and management of vascular access device: Secondary | ICD-10-CM | POA: Diagnosis not present

## 2023-12-18 DIAGNOSIS — D709 Neutropenia, unspecified: Secondary | ICD-10-CM | POA: Diagnosis not present

## 2023-12-18 DIAGNOSIS — D46Z Other myelodysplastic syndromes: Secondary | ICD-10-CM | POA: Diagnosis not present

## 2023-12-21 ENCOUNTER — Inpatient Hospital Stay

## 2023-12-21 ENCOUNTER — Encounter: Payer: Self-pay | Admitting: *Deleted

## 2023-12-21 DIAGNOSIS — Z79899 Other long term (current) drug therapy: Secondary | ICD-10-CM | POA: Diagnosis not present

## 2023-12-21 DIAGNOSIS — D46Z Other myelodysplastic syndromes: Secondary | ICD-10-CM

## 2023-12-21 DIAGNOSIS — Z952 Presence of prosthetic heart valve: Secondary | ICD-10-CM | POA: Diagnosis not present

## 2023-12-21 DIAGNOSIS — Z5111 Encounter for antineoplastic chemotherapy: Secondary | ICD-10-CM | POA: Diagnosis not present

## 2023-12-21 DIAGNOSIS — D708 Other neutropenia: Secondary | ICD-10-CM

## 2023-12-21 DIAGNOSIS — D696 Thrombocytopenia, unspecified: Secondary | ICD-10-CM

## 2023-12-21 DIAGNOSIS — D709 Neutropenia, unspecified: Secondary | ICD-10-CM

## 2023-12-21 DIAGNOSIS — Z7901 Long term (current) use of anticoagulants: Secondary | ICD-10-CM | POA: Diagnosis not present

## 2023-12-21 DIAGNOSIS — C9200 Acute myeloblastic leukemia, not having achieved remission: Secondary | ICD-10-CM | POA: Diagnosis not present

## 2023-12-21 DIAGNOSIS — D72810 Lymphocytopenia: Secondary | ICD-10-CM

## 2023-12-21 DIAGNOSIS — D4621 Refractory anemia with excess of blasts 1: Secondary | ICD-10-CM

## 2023-12-21 LAB — CMP (CANCER CENTER ONLY)
ALT: 23 U/L (ref 0–44)
AST: 27 U/L (ref 15–41)
Albumin: 4.3 g/dL (ref 3.5–5.0)
Alkaline Phosphatase: 71 U/L (ref 38–126)
Anion gap: 10 (ref 5–15)
BUN: 16 mg/dL (ref 8–23)
CO2: 26 mmol/L (ref 22–32)
Calcium: 9.7 mg/dL (ref 8.9–10.3)
Chloride: 105 mmol/L (ref 98–111)
Creatinine: 1.16 mg/dL (ref 0.61–1.24)
GFR, Estimated: >60 mL/min (ref >60)
Glucose, Bld: 129 mg/dL — ABNORMAL HIGH (ref 70–99)
Potassium: 4.4 mmol/L (ref 3.5–5.1)
Sodium: 141 mmol/L (ref 135–145)
Total Bilirubin: 0.5 mg/dL (ref 0.0–1.2)
Total Protein: 6.1 g/dL — ABNORMAL LOW (ref 6.5–8.1)

## 2023-12-21 LAB — CBC WITH DIFFERENTIAL (CANCER CENTER ONLY)
Abs Immature Granulocytes: 0.05 K/uL (ref 0.00–0.07)
Basophils Absolute: 0 K/uL (ref 0.0–0.1)
Basophils Relative: 1 %
Eosinophils Absolute: 0 K/uL (ref 0.0–0.5)
Eosinophils Relative: 1 %
HCT: 32.8 % — ABNORMAL LOW (ref 39.0–52.0)
Hemoglobin: 11 g/dL — ABNORMAL LOW (ref 13.0–17.0)
Immature Granulocytes: 2 %
Lymphocytes Relative: 7 %
Lymphs Abs: 0.1 K/uL — ABNORMAL LOW (ref 0.7–4.0)
MCH: 34.4 pg — ABNORMAL HIGH (ref 26.0–34.0)
MCHC: 33.5 g/dL (ref 30.0–36.0)
MCV: 102.5 fL — ABNORMAL HIGH (ref 80.0–100.0)
Monocytes Absolute: 0.4 K/uL (ref 0.1–1.0)
Monocytes Relative: 19 %
Neutro Abs: 1.5 K/uL — ABNORMAL LOW (ref 1.7–7.7)
Neutrophils Relative %: 70 %
Platelet Count: 89 K/uL — ABNORMAL LOW (ref 150–400)
RBC: 3.2 MIL/uL — ABNORMAL LOW (ref 4.22–5.81)
RDW: 22.3 % — ABNORMAL HIGH (ref 11.5–15.5)
WBC Count: 2.1 K/uL — ABNORMAL LOW (ref 4.0–10.5)
nRBC: 0 % (ref 0.0–0.2)

## 2023-12-21 LAB — PROTIME-INR
INR: 1.1 (ref 0.8–1.2)
Prothrombin Time: 14.4 s (ref 11.4–15.2)

## 2023-12-21 LAB — LACTATE DEHYDROGENASE: LDH: 285 U/L — ABNORMAL HIGH (ref 98–192)

## 2023-12-21 LAB — APTT: aPTT: 35 s (ref 24–36)

## 2023-12-21 LAB — URIC ACID: Uric Acid, Serum: 4.1 mg/dL (ref 3.7–8.6)

## 2023-12-21 LAB — PHOSPHORUS: Phosphorus: 2.7 mg/dL (ref 2.5–4.6)

## 2023-12-21 LAB — MAGNESIUM: Magnesium: 2 mg/dL (ref 1.7–2.4)

## 2023-12-21 LAB — SAMPLE TO BLOOD BANK

## 2023-12-21 LAB — D-DIMER, QUANTITATIVE: D-Dimer, Quant: 0.81 ug{FEU}/mL — ABNORMAL HIGH (ref 0.00–0.50)

## 2023-12-21 LAB — FIBRINOGEN: Fibrinogen: 354 mg/dL (ref 210–475)

## 2023-12-24 ENCOUNTER — Inpatient Hospital Stay

## 2023-12-24 DIAGNOSIS — Z79899 Other long term (current) drug therapy: Secondary | ICD-10-CM | POA: Diagnosis not present

## 2023-12-24 DIAGNOSIS — Z5111 Encounter for antineoplastic chemotherapy: Secondary | ICD-10-CM | POA: Diagnosis not present

## 2023-12-24 DIAGNOSIS — Z7901 Long term (current) use of anticoagulants: Secondary | ICD-10-CM | POA: Diagnosis not present

## 2023-12-24 DIAGNOSIS — D4621 Refractory anemia with excess of blasts 1: Secondary | ICD-10-CM

## 2023-12-24 DIAGNOSIS — C9200 Acute myeloblastic leukemia, not having achieved remission: Secondary | ICD-10-CM | POA: Diagnosis not present

## 2023-12-24 DIAGNOSIS — D46Z Other myelodysplastic syndromes: Secondary | ICD-10-CM

## 2023-12-24 DIAGNOSIS — Z952 Presence of prosthetic heart valve: Secondary | ICD-10-CM | POA: Diagnosis not present

## 2023-12-24 LAB — CBC WITH DIFFERENTIAL (CANCER CENTER ONLY)
Abs Immature Granulocytes: 0.02 K/uL (ref 0.00–0.07)
Basophils Absolute: 0 K/uL (ref 0.0–0.1)
Basophils Relative: 0 %
Eosinophils Absolute: 0 K/uL (ref 0.0–0.5)
Eosinophils Relative: 0 %
HCT: 31.5 % — ABNORMAL LOW (ref 39.0–52.0)
Hemoglobin: 10.6 g/dL — ABNORMAL LOW (ref 13.0–17.0)
Immature Granulocytes: 1 %
Lymphocytes Relative: 5 %
Lymphs Abs: 0.1 K/uL — ABNORMAL LOW (ref 0.7–4.0)
MCH: 34.5 pg — ABNORMAL HIGH (ref 26.0–34.0)
MCHC: 33.7 g/dL (ref 30.0–36.0)
MCV: 102.6 fL — ABNORMAL HIGH (ref 80.0–100.0)
Monocytes Absolute: 0.5 K/uL (ref 0.1–1.0)
Monocytes Relative: 20 %
Neutro Abs: 1.9 K/uL (ref 1.7–7.7)
Neutrophils Relative %: 74 %
Platelet Count: 82 K/uL — ABNORMAL LOW (ref 150–400)
RBC: 3.07 MIL/uL — ABNORMAL LOW (ref 4.22–5.81)
RDW: 21.6 % — ABNORMAL HIGH (ref 11.5–15.5)
WBC Count: 2.6 K/uL — ABNORMAL LOW (ref 4.0–10.5)
nRBC: 0 % (ref 0.0–0.2)

## 2023-12-24 LAB — CMP (CANCER CENTER ONLY)
ALT: 22 U/L (ref 0–44)
AST: 21 U/L (ref 15–41)
Albumin: 4.2 g/dL (ref 3.5–5.0)
Alkaline Phosphatase: 63 U/L (ref 38–126)
Anion gap: 9 (ref 5–15)
BUN: 17 mg/dL (ref 8–23)
CO2: 25 mmol/L (ref 22–32)
Calcium: 9.6 mg/dL (ref 8.9–10.3)
Chloride: 107 mmol/L (ref 98–111)
Creatinine: 1.05 mg/dL (ref 0.61–1.24)
GFR, Estimated: >60 mL/min (ref >60)
Glucose, Bld: 165 mg/dL — ABNORMAL HIGH (ref 70–99)
Potassium: 4 mmol/L (ref 3.5–5.1)
Sodium: 141 mmol/L (ref 135–145)
Total Bilirubin: 0.4 mg/dL (ref 0.0–1.2)
Total Protein: 6 g/dL — ABNORMAL LOW (ref 6.5–8.1)

## 2023-12-28 ENCOUNTER — Inpatient Hospital Stay

## 2023-12-28 DIAGNOSIS — Z79899 Other long term (current) drug therapy: Secondary | ICD-10-CM | POA: Diagnosis not present

## 2023-12-28 DIAGNOSIS — Z79624 Long term (current) use of inhibitors of nucleotide synthesis: Secondary | ICD-10-CM | POA: Diagnosis not present

## 2023-12-28 DIAGNOSIS — C9200 Acute myeloblastic leukemia, not having achieved remission: Secondary | ICD-10-CM | POA: Diagnosis not present

## 2023-12-28 DIAGNOSIS — D469 Myelodysplastic syndrome, unspecified: Secondary | ICD-10-CM | POA: Diagnosis not present

## 2023-12-28 DIAGNOSIS — Z792 Long term (current) use of antibiotics: Secondary | ICD-10-CM | POA: Diagnosis not present

## 2023-12-29 DIAGNOSIS — D469 Myelodysplastic syndrome, unspecified: Secondary | ICD-10-CM | POA: Diagnosis not present

## 2023-12-30 DIAGNOSIS — Z79899 Other long term (current) drug therapy: Secondary | ICD-10-CM | POA: Diagnosis not present

## 2023-12-30 DIAGNOSIS — Z952 Presence of prosthetic heart valve: Secondary | ICD-10-CM | POA: Diagnosis not present

## 2023-12-30 DIAGNOSIS — D469 Myelodysplastic syndrome, unspecified: Secondary | ICD-10-CM | POA: Diagnosis not present

## 2023-12-30 DIAGNOSIS — Z888 Allergy status to other drugs, medicaments and biological substances status: Secondary | ICD-10-CM | POA: Diagnosis not present

## 2023-12-30 DIAGNOSIS — Z79624 Long term (current) use of inhibitors of nucleotide synthesis: Secondary | ICD-10-CM | POA: Diagnosis not present

## 2023-12-30 DIAGNOSIS — Z885 Allergy status to narcotic agent status: Secondary | ICD-10-CM | POA: Diagnosis not present

## 2023-12-30 DIAGNOSIS — G629 Polyneuropathy, unspecified: Secondary | ICD-10-CM | POA: Diagnosis not present

## 2023-12-30 DIAGNOSIS — C9200 Acute myeloblastic leukemia, not having achieved remission: Secondary | ICD-10-CM | POA: Diagnosis not present

## 2023-12-30 DIAGNOSIS — Z85828 Personal history of other malignant neoplasm of skin: Secondary | ICD-10-CM | POA: Diagnosis not present

## 2023-12-30 DIAGNOSIS — D619 Aplastic anemia, unspecified: Secondary | ICD-10-CM | POA: Diagnosis not present

## 2023-12-30 DIAGNOSIS — K219 Gastro-esophageal reflux disease without esophagitis: Secondary | ICD-10-CM | POA: Diagnosis not present

## 2023-12-30 DIAGNOSIS — I10 Essential (primary) hypertension: Secondary | ICD-10-CM | POA: Diagnosis not present

## 2023-12-30 DIAGNOSIS — Z9221 Personal history of antineoplastic chemotherapy: Secondary | ICD-10-CM | POA: Diagnosis not present

## 2023-12-30 DIAGNOSIS — D696 Thrombocytopenia, unspecified: Secondary | ICD-10-CM | POA: Diagnosis not present

## 2023-12-30 DIAGNOSIS — N400 Enlarged prostate without lower urinary tract symptoms: Secondary | ICD-10-CM | POA: Diagnosis not present

## 2023-12-30 DIAGNOSIS — I2510 Atherosclerotic heart disease of native coronary artery without angina pectoris: Secondary | ICD-10-CM | POA: Diagnosis not present

## 2023-12-30 DIAGNOSIS — Z87891 Personal history of nicotine dependence: Secondary | ICD-10-CM | POA: Diagnosis not present

## 2023-12-30 NOTE — Progress Notes (Signed)
 Nursing Note  Patient: Patrick [name] Age: 83 y.o.  MRN: [id number] Attending: Laverna Glory Punches, MD   Date of Visit: 12/30/2023  History of Present Illness: Patrick [name] is a 83 y.o. male who presents for follow up.  Review of Systems  Constitutional:  Positive for fatigue (Ongoing). Negative for chills, diaphoresis and fever.  HENT:  Negative for mouth sores.   Respiratory:  Negative for shortness of breath.   Cardiovascular:  Positive for leg swelling (Swelling in ankles when wearing socks.).  Gastrointestinal:  Negative for constipation, diarrhea, nausea and vomiting.  Genitourinary:  Negative for frequency and urgency.  Musculoskeletal:  Negative for back pain and neck pain.  Skin:  Negative for rash and wound.  Neurological:  Positive for numbness (Numbness in toes in the evenings). Negative for dizziness, weakness, light-headedness and headaches.  Hematological:  Does not bruise/bleed easily.  Psychiatric/Behavioral:  Negative for sleep disturbance.    Physical Exam defer to MD.

## 2023-12-30 NOTE — Progress Notes (Signed)
 ------------------------------------------------------------------------------- Attestation signed by Laverna Glory Punches, MD at 01/03/2024 11:16 AM I have seen and examined the patient in conjunction with the PA and have formulated the treatment plan.  Please refer to the associated note for more details.   This was a shared visit and I performed the substantive portion of the visit based on the entirety of MDM.   I have reviewed the relevant documentation in the record. The level of service was chosen based on MDM.       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  He was treated with 4 cycles of 5-azacytidine, had progressive disease in the form of multiple ongoing cytopenias and transfusion dependence.   We saw him in consultation, and a complete workup was done here confirming a diagnosis of progression and hypomethylating agent failure, with elevated blast up to 18%, consistent with diagnosis of MDS/AML.  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   The patient received therapy off protocol with cladribine  as low-dose cytarabine  plus venetoclax .  He did well in the hospital.  He had minimal complications, and achieved complete remission with recovery of blood counts.  He then received a second cycle of therapy locally.  He did well with this.  He was followed carefully by his home doctor in [place] , recovered his counts, comes now today for follow-up visit.  A complete review of systems was performed today further documented in the associated note.  He feels very well.  He has good energy.  Performance status is 1.  Denies any fevers, chills, night sweats, nausea, vomiting, diarrhea, bleeding, bruising.   A complete physical exam was performed today, further documented in the associate note.  Lungs are  clear, heart regular, abdomen benign.  Extremities are warm with no cyanosis clubbing edema.  Recent Labs    Units 11/04/23 2114 11/06/23 0653 12/28/23 0941 12/30/23 0925  White Blood Cell K/uL 3.0* 4.6 3.8* 4.6  Neutrophil Abs K/uL  --   --  2.93 3.65  Manual Neutrophil Abs K/uL 1.44* 2.35  --   --   Hemoglobin g/dL 89.7* 9.8* 87.9* 87.3*  Platelet K/uL 160 181 92* 100*  Blasts % % 3.0*  --   --   --    Recent Labs    Units 11/06/23 0653 12/28/23 0941 12/30/23 0925  Sodium Level mmol/L 136 141 140  Potassium Level mmol/L 3.8 4.6* 4.1  Chloride mmol/L  104 105 107  CO2 mmol/L  24 28 25   BUN mg/dL 13 15 15   Creatinine mg/dL 8.91 8.91 8.82  Glucose Level mg/dL 877* 882*  --   Glucose Random mg/dL  --   --  858  Calcium  Level Total mg/dL 9.2 9.8 9.7   Recent Labs    Units 11/06/23 0653 12/28/23 0941 12/30/23 0925  ALT U/L 16 22 24   AST U/L 16 22 23   Alkaline Phosphatase U/L 92 75 80  Bilirubin Total mg/dL 0.5 0.4 0.5  LDH U/L 684* 313* 327*           Assessment and Plan: This is a 83 year old gentleman from [place]  who we initially saw on 09/30/2023, with a past history significant for hypertension, coronary artery disease, mechanical aortic valve replacement, currently on Lovenox , who is being treated on the outside for 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  He was receiving single agent Vidaza  at home.  He has received 4 cycles of Vidaza .  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  The patient received chemotherapy protocol with cladribine  for 5 days, low-dose cytarabine  for 10 days, and venetoclax  for 14 days.  He achieved complete morphologic remission.  He then received cycle 2 at home recently.   1.  MDS/AML.  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  His initial bone marrow showed 18% blasts, mutations in  TET2, and U2 AF-1.  Karyotype was diploid.   The patient is doing very well.  He is in complete remission.  His blood counts have recovered.  He feels well.  He received his second cycle of therapy with cladribine  plus low-dose of therapy plus venetoclax  at home.  We have been coordinating with his home doctor in [place] .  He has bone marrow done recently here at [facility] shows complete morphologic remission with low-level disease only detectable by flow cytometry.  He is doing very well, plan now is to continue with consolidation therapy.  This will include 2 cycles of HMA plus venetoclax .  He has received Vidaza  in the past.  We recommend decitabine 20 mg/m daily for 5 days combined with venetoclax  daily for 5 to 7 days.  He received 2 cycles of therapy at home, plan to return to see us  in January 2026 for reevaluation,, and repeat bone marrow to assess response.   2.  Cardiac.  The patient has a diagnosis of hypertension, continues on amlodipine , doing well.  He has history of mechanical aortic valve and was on warfarin previously.  In the setting of impending thrombocytopenia with chemotherapy, he is continued on Lovenox , and is doing well overall.  He will continue to follow with his local doctor for this.  Given the complications of coming on and off warfarin in the setting of thrombocytopenia, we recommend to continue Lovenox  for now, and having him discuss with his cardiologist.   The patient had ample opportunity to ask questions today which were answered to their satisfaction. -------------------------------------------------------------------------------      CLINIC NOTE   Patient: Patrick [name] Age: 83 y.o.  MRN: [id number]  Attending: Laverna Glory Punches, MD   DATE OF Visit: December 30, 2023  REASON FOR VISIT: Follow up for Acute myeloid leukemia with TET2, [id number]  Routine follow up with treatment planning  Subjective   CURRENT THERAPY   COURSE/DAY:      PATHOLOGY: Subsequent BMAs: Date Blast % Morph CG NGS FLOW  09/30/2023 18% (prelim) pending pending pending pending   CURRENT ANTIMICROBIALS: Levofloxacin  500 mg daily and Ciprofloxacin  500 mg BID Famciclovir  250 mg daily  Will talk to patient about stopping one of the fluoroquinolones    Active Treatments       Type Next Treatment Plan Day Planned For * * * *   LEU IP/OP: Venetoclax  with Cladribine  and Low-Dose CYTarabine  Alternating with Decitabine in Older Patients with AML ONCOLOGY TREATMENT 1 Day 1, Cycle 2 Tue 11/17/2023 -- *  0/6 0/6       PRIOR THERAPY: Azacitadine 75 mg/m2 IV q day x 7 day C1D1 06/29/23 subq C2D1 07/27/23, changed to IV because he did not like the injections  C3D1 08/24/23 IV C4D1 09/21/23 - last day was yesterday, 8/5  TREATMENT HISTORY: Oncology History  Acute myeloid leukemia without  mention of having achieved remission  10/10/2023 -  [place]-Chemotherapy/Biotherapy/Investigational/SMI (Autogenerated)    Treatment Plan   LEU IP/OP: Venetoclax  with Cladribine  and Low-Dose CYTarabine  Alternating with Decitabine in Older Patients with AML   Chemotherapy summary: venetoclax  (VENCLEXTA ) tablet 100 mg, oral, 1 of 1 cycle venetoclax  (Venclexta ) 50 mg tablet, oral, 1 of 1 cycle, Start date: 11/16/2023, End date: -- venetoclax  (Venclexta ) 100 mg tablet, oral, 1 of 1 cycle, Start date: 10/10/2023, End date: 11/05/2023 cladribine  (LEUSTATIN ) 9.8 mg in sodium chloride  0.9% (NS) 250 mL IVPB, intravenous, 1 of 10 cycles decitabine (DACOGEN) 39 mg in sodium chloride  0.9% (NS) 50 mL IVPB, intravenous, 0 of 8 cycles CYTarabine  (CYTOSAR) syringe prescription (Home Use), subcutaneous, 0 of 9 cycles   Treatment Dates: 10/10/2023 to 02/09/2025 (Planned)  Line of Treatment: Heme: Induction Therapy   Treatment Goal:    Discontinue Reason: Dwan is still active]        History of Present Illness Lason Eveland is an 83 year old male with 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  He has completed 2 cycles of chemotherapy for AML, including cladribine , low-dose cytarabine , venetoclax . Cycle one was well tolerated, and his subsequent cycle was administered locally at home in [place] , with a regimen matching his course at [facility].  The most recent cycle was completed approximately one week ago.   Bone marrow aspiration and biopsy on 12/28/2023 demonstrated morphologic remission with minimal residual disease (MRD 0.36% by flow cytometry). He has not required transfusions since returning home, except for one phosphorus infusion, per patient.   He has blood work drawn twice weekly and denies fevers, chills, sweats, abnormal bleeding, or bruising. He continues to have difficulty gaining weight despite a robust appetite but otherwise feels well overall.  He has a PICC line in place, which he finds bothersome and is eager to have removed, particularly to resume activities such as golf. He is concerned about the PICC line moving during activity. He has not had any recent rashes, but previously developed bright red, pruritic spots on his legs that are now resolving. No new rashes have developed. He notes soreness at the bone marrow aspiration site but no significant pain.  He is currently on Lovenox  injections for anticoagulation due to a mechanical aortic valve, with dosing adjusted for platelet count. He has self-administered Lovenox  since April, except for a brief pause around the time of his recent bone marrow procedure. He previously used Coumadin  and is considering transitioning back, pending coordination with his cardiologist and local oncologist. He has INR monitored at a local lab. No recent appointments with his cardiologist are scheduled.  Oct 02, 2023: Follow-up for AML with myelodysplasia-related changes; patient progressed from MDS (RAEB-1) after 4 cycles of  azacitidine  to high-risk AML with 18-20% marrow blasts. Reported fatigue, recent transfusion needs, and significant cytopenias (WBC 0.4, Hgb 8.2, platelets 32,000). Treatment options discussed included venetoclax -based regimens and clinical trials; plan to return in 1 week for further evaluation and possible chemotherapy initiation. Continued on Lovenox  for mechanical valve despite thrombocytopenia, and prophylactic antimicrobials.   Past Medical History:  Diagnosis Date  . Anemia 2023   Refractory Anemia with Excess RAEB-1  . Arthritis 2019   In hands  . Basal cell carcinoma of skin 2015   Mohs surgery  . Benign prostatic hyperplasia 1970   Had operation (Radiologist)  . Diverticulitis 2015   comes and goes  . Eczema 2015   Under  control  . Gallstone 2017   untreated  . Gastric reflux 2019   taking daily med  . Migraine 1973   still have visual phenomena but no pain  . Neuropathy 2022   usually at night  . Pneumonia 1954  . Renal stone 1968   none for the last 30 years  .  Refujio  has a past medical history of Anemia (2023), Arthritis (2019), Basal cell carcinoma of skin (2015), Benign prostatic hyperplasia (1970), Diverticulitis (2015), Eczema (2015), Gallstone (2017), Gastric reflux (2019), Migraine (1973), Neuropathy (2022), Pneumonia (1954), and Renal stone (1968).  Review of Systems  Constitutional:  Positive for fatigue. Negative for chills, diaphoresis, fever and unexpected weight change.  HENT:  Negative for congestion, facial swelling, mouth sores, nosebleeds, rhinorrhea, sinus pressure, sore throat, trouble swallowing and voice change.   Eyes:  Negative for photophobia, pain, redness and visual disturbance.  Respiratory:  Negative for apnea, cough, chest tightness, shortness of breath and wheezing.   Cardiovascular:  Negative for chest pain, palpitations and leg swelling.  Gastrointestinal:  Negative for abdominal distention, abdominal pain, blood in stool, constipation,  diarrhea, nausea and vomiting.  Endocrine: Negative.   Genitourinary:  Negative for decreased urine volume, difficulty urinating, dysuria, flank pain, frequency, hematuria and urgency.  Musculoskeletal:  Negative for arthralgias, back pain, gait problem, joint swelling, myalgias, neck pain and neck stiffness.  Skin:  Negative for color change, pallor and rash.  Allergic/Immunologic: Positive for immunocompromised state.  Neurological:  Negative for dizziness, syncope, weakness, numbness and headaches.  Hematological:  Negative for adenopathy. Does not bruise/bleed easily.  Psychiatric/Behavioral: Negative.    All other systems reviewed and are negative.   Allergies  Allergen Reactions  . Codeine  Other (See Comments) and Rash    ...  . Terbinafine Itching     Nutrition status: Nutrition status: no nutritional compromise at this time ECOG performance status: (1) Restricted in physically strenuous activity, ambulatory and able to do work of light nature.  Need for nutrition consult? YES NO: no  Current Medications  Medication Sig Start Date End Date Taking? Authorizing Provider  amLODIPine  (NORVASC ) 2.5 mg tablet Take 3 tablets (7.5 mg) by mouth daily. 11/05/23  Yes Borthakur, Emaline, MD  atorvastatin  (LIPITOR) 10 mg tablet Take 1 tablet (10 mg) by mouth daily. 06/09/23  Yes HISTORICAL PROVIDER  diphenoxylate -atropine  (LOMOTIL ) 2.5 mg-0.025 mg per tablet Take 1 tablet by mouth every 6 (six) hours as needed for diarrhea. 04/17/23  Yes HISTORICAL PROVIDER  enoxaparin  (LOVENOX ) 120 mg/0.8 mL prefilled syringe Inject 0.8 mL (120 mg) under the skin daily. 06/29/23  Yes HISTORICAL PROVIDER  levoFLOXacin  (LEVAQUIN ) 500 mg tablet Take 1 tablet (500 mg) by mouth daily. 11/05/23  Yes Borthakur, Emaline, MD  ondansetron  (ZOFRAN ) 8 mg tablet Take 1 tablet (8 mg) by mouth every 8 (eight) hours as needed for nausea.  Takes every AM when he gets infusion 06/29/23  Yes HISTORICAL PROVIDER  pantoprazole   (PROTONIX ) 40 mg EC tablet Take 1 tablet (40 mg) by mouth every morning before breakfast.   Yes HISTORICAL PROVIDER  posaconazole  (NoxafiL ) 100 mg DR tablet Take 3 tablets (300 mg) by mouth daily. 10/20/23  Yes Bridget Fallow, MD  rOPINIRole  (REQUIP ) 0.25 mg tablet Take 1 tablet (0.25 mg) by mouth 3 (three) times a day. 09/28/23  Yes HISTORICAL PROVIDER  sodium chloride  (NS) 0.9% flush syringe 10 mL Inject 10 mL (1 syringe) into each lumen of central venous catheter daily as directed. 11/05/23  Yes Ileen Emaline, MD  SUMAtriptan  (IMITREX ) 25 mg tablet Take 1 tablet (25 mg) by mouth as needed for migraine. 05/17/22  Yes HISTORICAL PROVIDER  valACYclovir  (VALTREX ) 500 mg tablet Take 1 tablet (500 mg) by mouth daily. 10/21/23  Yes Bridget Fallow, MD  venetoclax  (Venclexta ) 50 mg tablet Take 1 tablet (50 mg) by mouth daily. 11/16/23  Yes Laverna Glory Punches, MD  tadalafil  (CIALIS ) 5 mg tablet Take 1 tablet (5 mg) by mouth daily as needed for erectile dysfunction. Patient not taking: Reported on 11/06/2023 09/10/23   HISTORICAL PROVIDER    Family History  Problem Relation Name Age of Onset  . Ovarian cancer Mother Mervyn JULIANNA Clause     No past surgical history on file. He  has no past surgical history on file.  Family History  Problem Relation Name Age of Onset  . Ovarian cancer Mother Mervyn JULIANNA Clause     @SOC1 @   Objective  Vitals:   12/30/23 0929  BP: 149/80  Pulse: 66  Resp: 17  Temp: 36.4 C (97.5 F)  TempSrc: Oral  SpO2: 99%  Weight: 72.3 kg (159 lb 6.3 oz)   Weight  12/30/23 72.3 kg (159 lb 6.3 oz)  11/06/23 74.9 kg (165 lb 2 oz)  11/04/23 71.6 kg (157 lb 13.6 oz)   Blood Pressure  12/30/23 149/80  12/28/23 (!) 155/93  11/06/23 138/72   Heart Rate  12/30/23 66  12/28/23 72  11/06/23 65   Temperature  12/30/23 36.4 C (97.5 F) (Oral)  11/06/23 36.3 C (97.3 F) (Oral)  11/05/23 36.5 C (97.7 F) (Oral)   Respirations  12/30/23 17  11/06/23 18  11/05/23 16   Wt  Readings from Last 3 Encounters:  12/30/23 72.3 kg (159 lb 6.3 oz)  11/06/23 74.9 kg (165 lb 2 oz)  11/04/23 71.6 kg (157 lb 13.6 oz)   ECOG Status: 1 - Symptomatic but completely ambulatory     Physical Exam Vitals and nursing note reviewed.  Constitutional:      General: He is not in acute distress.    Appearance: He appears well-developed. He is not diaphoretic.  HENT:     Head: Normocephalic and atraumatic.     Nose: Nose normal.     Mouth/Throat:     Pharynx: Uvula midline.     Right Ear: Tympanic membrane and external ear normal.     Left Ear: Tympanic membrane and external ear normal.  Eyes:     General: No scleral icterus.    Conjunctiva/sclera: Conjunctivae normal.     Pupils: Pupils are equal, round, and reactive to light.  Neck:     Thyroid : No thyromegaly.     Vascular: No JVD.  Cardiovascular:     Rate and Rhythm: Normal rate and regular rhythm.     Heart sounds: Murmur heard.     Systolic murmur is present.     No friction rub. No gallop.     Comments: Click from AV valve Pulmonary:     Effort: Pulmonary effort is normal. No accessory muscle usage or respiratory distress.     Breath sounds: Normal breath sounds. No wheezing or rales.  Chest:     Chest wall: No tenderness.  Abdominal:     General: Bowel sounds are normal. There is no distension or abdominal bruit.     Palpations: Abdomen is soft.     Tenderness: There is no abdominal tenderness.     Hernia: No hernia is present.  Musculoskeletal:  General: No tenderness. Normal range of motion.     Cervical back: Normal range of motion.  Lymphadenopathy:     Cervical: No cervical adenopathy.  Skin:    General: Skin is warm.  Neurological:     Mental Status: He is alert and oriented to person, place, and time.     Coordination: Coordination normal.  Psychiatric:        Behavior: Behavior normal. Behavior is cooperative.        Thought Content: Thought content normal.        Judgment: Judgment  normal.     LABORATORY DATA/IMAGING/PATHOLOGY: I have reviewed pertinent laboratory results and pertinent pathology data. Results LABS Hb: 8.2 (10/02/2023) PLT: 32 (10/02/2023)  PATHOLOGY Bone marrow biopsy: 18% to 20% blasts (09/30/2023)   PATHOLOGY Bone marrow aspiration biopsy: Morphologic remission with flow cytometry showing 0.36% positivity (12/28/2023)  LABORATORY DATA: HEMATOLOGY Recent Labs    Units 11/04/23 2114 11/06/23 0653 12/28/23 0941 12/30/23 0925  White Blood Cell K/uL 3.0* 4.6 3.8* 4.6  Hemoglobin g/dL 89.7* 9.8* 87.9* 87.3*  Hematocrit % 29.4* 28.7* 35.3* 36.0*  Mean Cell Volume fL 94 94 101* 98  Platelet K/uL 160 181 92* 100*  Neutrophil Abs K/uL  --   --  2.93 3.65  Manual Neutrophil Abs K/uL 1.44* 2.35  --   --    Lab Results  Component Value Date   BLASTS 3.0 (H) 11/04/2023   BLASTS 3.0 (H) 11/04/2023   BLASTS 1.0 (H) 11/03/2023   BLASTS 1.0 (H) 11/02/2023   Results from last 7 days  Lab Units 12/30/23 0925 12/28/23 0941  LYMPHOCYTE ABS K/uL 0.21* 0.19*  MONOCYTE ABS K/uL 0.64 0.60  EOSINOPHIL ABS K/uL 0.02 0.02  BASOPHIL ABS K/uL 0.02 0.02     CHEMISTRY Recent Labs    Units 11/06/23 0653 12/28/23 0941 12/30/23 0925  Glucose Level mg/dL 877* 882*  --   Glucose Random mg/dL  --   --  858  Calcium  Level Total mg/dL 9.2 9.8 9.7  Sodium Level mmol/L 136 141 140  Potassium Level mmol/L 3.8 4.6* 4.1  CO2 mmol/L  24 28 25   Chloride mmol/L  104 105 107  BUN mg/dL 13 15 15   Creatinine mg/dL 8.91 8.91 8.82  eGFR fO/fpw/8.26 sq. m 69 69 63  Magnesium  Level mg/dL 2.2 2.1 2.1  Phosphorus Level mg/dL 2.2* 2.3* 2.9    LFTs Recent Labs    Units 11/06/23 0653 12/28/23 0941 12/30/23 0925  Albumin Level gm/dL 3.8 4.1 4.2  ALT U/L 16 22 24   AST U/L 16 22 23   Alkaline Phosphatase U/L 92 75 80  Bilirubin Total mg/dL 0.5 0.4 0.5  LDH U/L 684* 313* 327*  Uric Acid mg/dL 3.6 3.8 4.2      @LABALLVALUES (TSH:5,T4:5,T3:5)@ @LABALLVALUES (HGBA1)@  COAGS Recent Labs    Units 11/03/23 0052 11/04/23 2114  Prothrombin Time second(s) 14.6* 15.0*  Activated PTT second(s) 33.0 36.7*  International Normalization Ratio  1.16* 1.20*  D-Dimer mcg/ml FEU 1.30* 1.61*  Fibrinogen  mg/dL 568 525     Cardiac Enzymes No results for input(s): CK, CKMB, TROPONINT, BNP, NTPROBNP in the last 72 hours.    Inflammatory Markers No results for input(s): PROCALCITO, SEDRATE, CRP, IL6 in the last 72 hours.  Invalid input(s): IL1   Infusions: No current facility-administered medications for this visit.    Meds:   Abx:     Nutrition: Active Orders  There are no active orders of the following types: Diet, Nourishments.  MICROBIOLOGY Cultures: Results     ** No results found for the last 168 hours. **        @MICRORESULTS14D @    IMAGING: Imaging: No results found. No results found.  Interpretation: I have reviewed the pertinent radiographic imaging.      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  He remains in morphologic remission following induction and consolidation with cladribine , cytarabine , and venetoclax .  He has demonstrated improved bone marrow function and transfusion independence, but ongoing risk persists due to MRD. Coordination with primary and local oncologists continues for management and monitoring. Continue alternating chemotherapy regimens (cladribine /cytarabine /venetoclax  and HMA/venetoclax ) with adjustments for cytopenias or infection as indicated. Coordinate next cycle of therapy after discussion with local oncologist. Follow up on 03/02/2024.  2. 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  Bone marrow function is recovering with ongoing leukemia therapy, but risk for cytopenias persists due to therapy and underlying disease. Monitor hemoglobin and platelet counts twice weekly unless otherwise specified. Assess transfusion requirements based on laboratory results and symptoms.  3. Anticoagulation management for prosthetic heart valve with thrombocytopenia On Lovenox  for anticoagulation due to mechanical aortic valve, with dosing adjusted for thrombocytopenia.  Previously on warfarin and considering transition back to minimize daily injections.  Missed several Lovenox  doses around bone marrow biopsy; advised to resume. Ongoing thrombocytopenia and bleeding risk complicate management.  Coordination with cardiology and oncology is essential. Advised to resume Lovenox  injections after bone marrow biopsy. Will require coordination with cardiologist (Dr. Patsie) and local oncologist to facilitate transition from Lovenox  to warfarin. Plan for bridging anticoagulation during transition. Ensure only one provider prescribes anticoagulation to avoid confusion. Monitor platelet counts closely and adjust anticoagulation as needed for thrombocytopenia.  4. History of hairy cell leukemia, resolved Previously detected incidentally on initial bone marrow, now resolved following cladribine  therapy as part of AML induction.  No evidence of hairy cell leukemia on most recent bone marrow.  Baudelio Karnes is aware of the indications to report to the [facility] to include a fever of 100.5 or greater, chills, spontaneous bleeding, or any other symptoms requiring immediate intervention.  This was a shared visit with Dr. Laverna. I spent 30 minutes of reportable time on the date of the visit, which does not include any overlapping time spent with the billing provider.  Dr. Laverna was present and participated in all aspects of this patient's care.   Please see his  associated note for further details on today's visit and care plan. All questions were tended to and answered to the best of our ability.   The patient understands and is in agreement with current plan and recommendations. The patient was informed to contact the clinic if any futher questions arise.  This document was created using a voice recognition transcribing system. Incorrect words or phrases may have been missed during proofreading. Please interpret accordingly.  I obtained verbal consent from the patient or their proxy to record this visit for purposes of producing a draft of the encounter documentation.   Alexis C. Geppner, MLS, CTTS,  MPAS, PA-C Department of Leukemia       *Some images could not be shown.

## 2023-12-31 ENCOUNTER — Inpatient Hospital Stay

## 2023-12-31 ENCOUNTER — Other Ambulatory Visit: Payer: Self-pay | Admitting: Hematology & Oncology

## 2023-12-31 ENCOUNTER — Encounter: Payer: Self-pay | Admitting: Hematology & Oncology

## 2023-12-31 ENCOUNTER — Inpatient Hospital Stay: Attending: Hematology & Oncology

## 2023-12-31 DIAGNOSIS — Z952 Presence of prosthetic heart valve: Secondary | ICD-10-CM | POA: Insufficient documentation

## 2023-12-31 DIAGNOSIS — D709 Neutropenia, unspecified: Secondary | ICD-10-CM

## 2023-12-31 DIAGNOSIS — Z79899 Other long term (current) drug therapy: Secondary | ICD-10-CM | POA: Diagnosis not present

## 2023-12-31 DIAGNOSIS — D696 Thrombocytopenia, unspecified: Secondary | ICD-10-CM

## 2023-12-31 DIAGNOSIS — Z7901 Long term (current) use of anticoagulants: Secondary | ICD-10-CM | POA: Diagnosis not present

## 2023-12-31 DIAGNOSIS — Z5111 Encounter for antineoplastic chemotherapy: Secondary | ICD-10-CM | POA: Insufficient documentation

## 2023-12-31 DIAGNOSIS — D72810 Lymphocytopenia: Secondary | ICD-10-CM

## 2023-12-31 DIAGNOSIS — C9200 Acute myeloblastic leukemia, not having achieved remission: Secondary | ICD-10-CM | POA: Diagnosis not present

## 2023-12-31 DIAGNOSIS — D46Z Other myelodysplastic syndromes: Secondary | ICD-10-CM

## 2023-12-31 DIAGNOSIS — D4621 Refractory anemia with excess of blasts 1: Secondary | ICD-10-CM

## 2023-12-31 DIAGNOSIS — D708 Other neutropenia: Secondary | ICD-10-CM

## 2023-12-31 LAB — CMP (CANCER CENTER ONLY)
ALT: 22 U/L (ref 0–44)
AST: 21 U/L (ref 15–41)
Albumin: 4.4 g/dL (ref 3.5–5.0)
Alkaline Phosphatase: 75 U/L (ref 38–126)
Anion gap: 9 (ref 5–15)
BUN: 18 mg/dL (ref 8–23)
CO2: 27 mmol/L (ref 22–32)
Calcium: 10 mg/dL (ref 8.9–10.3)
Chloride: 105 mmol/L (ref 98–111)
Creatinine: 1.15 mg/dL (ref 0.61–1.24)
GFR, Estimated: >60 mL/min (ref >60)
Glucose, Bld: 179 mg/dL — ABNORMAL HIGH (ref 70–99)
Potassium: 4.8 mmol/L (ref 3.5–5.1)
Sodium: 141 mmol/L (ref 135–145)
Total Bilirubin: 0.5 mg/dL (ref 0.0–1.2)
Total Protein: 6.4 g/dL — ABNORMAL LOW (ref 6.5–8.1)

## 2023-12-31 LAB — CBC WITH DIFFERENTIAL (CANCER CENTER ONLY)
Abs Immature Granulocytes: 0.03 K/uL (ref 0.00–0.07)
Basophils Absolute: 0 K/uL (ref 0.0–0.1)
Basophils Relative: 0 %
Eosinophils Absolute: 0 K/uL (ref 0.0–0.5)
Eosinophils Relative: 1 %
HCT: 37.4 % — ABNORMAL LOW (ref 39.0–52.0)
Hemoglobin: 12.7 g/dL — ABNORMAL LOW (ref 13.0–17.0)
Immature Granulocytes: 1 %
Lymphocytes Relative: 5 %
Lymphs Abs: 0.2 K/uL — ABNORMAL LOW (ref 0.7–4.0)
MCH: 34.5 pg — ABNORMAL HIGH (ref 26.0–34.0)
MCHC: 34 g/dL (ref 30.0–36.0)
MCV: 101.6 fL — ABNORMAL HIGH (ref 80.0–100.0)
Monocytes Absolute: 0.5 K/uL (ref 0.1–1.0)
Monocytes Relative: 12 %
Neutro Abs: 3.2 K/uL (ref 1.7–7.7)
Neutrophils Relative %: 81 %
Platelet Count: 108 K/uL — ABNORMAL LOW (ref 150–400)
RBC: 3.68 MIL/uL — ABNORMAL LOW (ref 4.22–5.81)
RDW: 19.9 % — ABNORMAL HIGH (ref 11.5–15.5)
WBC Count: 4 K/uL (ref 4.0–10.5)
nRBC: 0 % (ref 0.0–0.2)

## 2023-12-31 LAB — D-DIMER, QUANTITATIVE: D-Dimer, Quant: 1.71 ug{FEU}/mL — ABNORMAL HIGH (ref 0.00–0.50)

## 2023-12-31 LAB — LACTATE DEHYDROGENASE: LDH: 318 U/L — ABNORMAL HIGH (ref 98–192)

## 2023-12-31 LAB — MAGNESIUM: Magnesium: 2.1 mg/dL (ref 1.7–2.4)

## 2023-12-31 LAB — PHOSPHORUS: Phosphorus: 3.1 mg/dL (ref 2.5–4.6)

## 2023-12-31 LAB — URIC ACID: Uric Acid, Serum: 4.8 mg/dL (ref 3.7–8.6)

## 2023-12-31 LAB — SAMPLE TO BLOOD BANK

## 2023-12-31 LAB — PROTIME-INR
INR: 1.1 (ref 0.8–1.2)
Prothrombin Time: 15 s (ref 11.4–15.2)

## 2023-12-31 LAB — FIBRINOGEN: Fibrinogen: 292 mg/dL (ref 210–475)

## 2023-12-31 LAB — APTT: aPTT: 37 s — ABNORMAL HIGH (ref 24–36)

## 2024-01-01 ENCOUNTER — Other Ambulatory Visit: Payer: Self-pay | Admitting: Hematology & Oncology

## 2024-01-01 ENCOUNTER — Encounter: Payer: Self-pay | Admitting: Hematology & Oncology

## 2024-01-01 DIAGNOSIS — D4621 Refractory anemia with excess of blasts 1: Secondary | ICD-10-CM

## 2024-01-01 DIAGNOSIS — C9201 Acute myeloblastic leukemia, in remission: Secondary | ICD-10-CM

## 2024-01-01 DIAGNOSIS — D46Z Other myelodysplastic syndromes: Secondary | ICD-10-CM

## 2024-01-01 NOTE — Telephone Encounter (Signed)
 Dr. Jessy office called, requesting call back by Dr. Laverna, patient is due for follow up visit with Dr. Timmy on Monday. Dr. Timmy, Cell # [phone]

## 2024-01-02 DIAGNOSIS — D469 Myelodysplastic syndrome, unspecified: Secondary | ICD-10-CM | POA: Diagnosis not present

## 2024-01-04 ENCOUNTER — Other Ambulatory Visit: Payer: Self-pay | Admitting: Medical Oncology

## 2024-01-04 ENCOUNTER — Inpatient Hospital Stay

## 2024-01-04 ENCOUNTER — Other Ambulatory Visit: Payer: Self-pay

## 2024-01-04 ENCOUNTER — Encounter: Payer: Self-pay | Admitting: Hematology & Oncology

## 2024-01-04 ENCOUNTER — Inpatient Hospital Stay: Admitting: Hematology & Oncology

## 2024-01-04 VITALS — BP 136/77 | HR 49 | Temp 97.6°F | Resp 20 | Ht 71.0 in | Wt 160.8 lb

## 2024-01-04 DIAGNOSIS — D46Z Other myelodysplastic syndromes: Secondary | ICD-10-CM

## 2024-01-04 DIAGNOSIS — D4621 Refractory anemia with excess of blasts 1: Secondary | ICD-10-CM

## 2024-01-04 DIAGNOSIS — D708 Other neutropenia: Secondary | ICD-10-CM

## 2024-01-04 DIAGNOSIS — Z5111 Encounter for antineoplastic chemotherapy: Secondary | ICD-10-CM | POA: Diagnosis not present

## 2024-01-04 DIAGNOSIS — C9201 Acute myeloblastic leukemia, in remission: Secondary | ICD-10-CM | POA: Diagnosis not present

## 2024-01-04 DIAGNOSIS — D72810 Lymphocytopenia: Secondary | ICD-10-CM

## 2024-01-04 DIAGNOSIS — C9200 Acute myeloblastic leukemia, not having achieved remission: Secondary | ICD-10-CM | POA: Diagnosis not present

## 2024-01-04 DIAGNOSIS — Z952 Presence of prosthetic heart valve: Secondary | ICD-10-CM | POA: Diagnosis not present

## 2024-01-04 DIAGNOSIS — Z79899 Other long term (current) drug therapy: Secondary | ICD-10-CM | POA: Diagnosis not present

## 2024-01-04 DIAGNOSIS — D696 Thrombocytopenia, unspecified: Secondary | ICD-10-CM

## 2024-01-04 DIAGNOSIS — D709 Neutropenia, unspecified: Secondary | ICD-10-CM

## 2024-01-04 DIAGNOSIS — Z7901 Long term (current) use of anticoagulants: Secondary | ICD-10-CM | POA: Diagnosis not present

## 2024-01-04 LAB — CMP (CANCER CENTER ONLY)
ALT: 23 U/L (ref 0–44)
AST: 25 U/L (ref 15–41)
Albumin: 4.5 g/dL (ref 3.5–5.0)
Alkaline Phosphatase: 71 U/L (ref 38–126)
Anion gap: 9 (ref 5–15)
BUN: 15 mg/dL (ref 8–23)
CO2: 27 mmol/L (ref 22–32)
Calcium: 9.9 mg/dL (ref 8.9–10.3)
Chloride: 106 mmol/L (ref 98–111)
Creatinine: 1.06 mg/dL (ref 0.61–1.24)
GFR, Estimated: >60 mL/min (ref >60)
Glucose, Bld: 122 mg/dL — ABNORMAL HIGH (ref 70–99)
Potassium: 4.5 mmol/L (ref 3.5–5.1)
Sodium: 141 mmol/L (ref 135–145)
Total Bilirubin: 0.5 mg/dL (ref 0.0–1.2)
Total Protein: 6.3 g/dL — ABNORMAL LOW (ref 6.5–8.1)

## 2024-01-04 LAB — CBC WITH DIFFERENTIAL (CANCER CENTER ONLY)
Abs Immature Granulocytes: 0.03 K/uL (ref 0.00–0.07)
Basophils Absolute: 0 K/uL (ref 0.0–0.1)
Basophils Relative: 0 %
Eosinophils Absolute: 0 K/uL (ref 0.0–0.5)
Eosinophils Relative: 1 %
HCT: 37.7 % — ABNORMAL LOW (ref 39.0–52.0)
Hemoglobin: 12.7 g/dL — ABNORMAL LOW (ref 13.0–17.0)
Immature Granulocytes: 1 %
Lymphocytes Relative: 5 %
Lymphs Abs: 0.2 K/uL — ABNORMAL LOW (ref 0.7–4.0)
MCH: 34.2 pg — ABNORMAL HIGH (ref 26.0–34.0)
MCHC: 33.7 g/dL (ref 30.0–36.0)
MCV: 101.6 fL — ABNORMAL HIGH (ref 80.0–100.0)
Monocytes Absolute: 0.5 K/uL (ref 0.1–1.0)
Monocytes Relative: 13 %
Neutro Abs: 3.1 K/uL (ref 1.7–7.7)
Neutrophils Relative %: 80 %
Platelet Count: 115 K/uL — ABNORMAL LOW (ref 150–400)
RBC: 3.71 MIL/uL — ABNORMAL LOW (ref 4.22–5.81)
RDW: 18.4 % — ABNORMAL HIGH (ref 11.5–15.5)
WBC Count: 3.8 K/uL — ABNORMAL LOW (ref 4.0–10.5)
nRBC: 0 % (ref 0.0–0.2)

## 2024-01-04 LAB — LACTATE DEHYDROGENASE: LDH: 327 U/L — ABNORMAL HIGH (ref 98–192)

## 2024-01-04 LAB — PROTIME-INR
INR: 1.1 (ref 0.8–1.2)
Prothrombin Time: 14.3 s (ref 11.4–15.2)

## 2024-01-04 LAB — FIBRINOGEN: Fibrinogen: 307 mg/dL (ref 210–475)

## 2024-01-04 LAB — APTT: aPTT: 35 s (ref 24–36)

## 2024-01-04 LAB — PHOSPHORUS: Phosphorus: 3 mg/dL (ref 2.5–4.6)

## 2024-01-04 LAB — SAMPLE TO BLOOD BANK

## 2024-01-04 LAB — URIC ACID: Uric Acid, Serum: 3.6 mg/dL — ABNORMAL LOW (ref 3.7–8.6)

## 2024-01-04 LAB — D-DIMER, QUANTITATIVE: D-Dimer, Quant: 0.95 ug{FEU}/mL — ABNORMAL HIGH (ref 0.00–0.50)

## 2024-01-04 LAB — MAGNESIUM: Magnesium: 2.2 mg/dL (ref 1.7–2.4)

## 2024-01-04 MED ORDER — ONDANSETRON HCL 8 MG PO TABS: 8.0000 mg | ORAL_TABLET | Freq: Three times a day (TID) | ORAL | 1 refills | Status: DC | PRN

## 2024-01-04 MED ORDER — SODIUM CHLORIDE 0.9 % IV SOLN: INTRAVENOUS | Status: DC

## 2024-01-04 MED ORDER — PROCHLORPERAZINE MALEATE 10 MG PO TABS: 10.0000 mg | ORAL_TABLET | Freq: Once | ORAL | Status: DC

## 2024-01-04 MED ORDER — SODIUM CHLORIDE 0.9 % IV SOLN
20.0000 mg/m2 | Freq: Once | INTRAVENOUS | Status: AC
  Administered 2024-01-04: 40 mg via INTRAVENOUS
  Filled 2024-01-04: qty 8

## 2024-01-04 MED ORDER — VENETOCLAX 100 MG PO TABS
100.0000 mg | ORAL_TABLET | Freq: Every day | ORAL | 0 refills | Status: AC
  Filled 2024-01-04 – 2024-01-05 (×2): qty 2, 2d supply, fill #0

## 2024-01-04 MED ORDER — PROCHLORPERAZINE MALEATE 10 MG PO TABS: 10.0000 mg | ORAL_TABLET | Freq: Four times a day (QID) | ORAL | 1 refills | Status: DC | PRN

## 2024-01-04 NOTE — Patient Instructions (Signed)
Decitabine Injection What is this medication? DECITABINE (dee SYE ta been) treats blood and bone marrow cancers. It works by slowing down the growth of cancer cells. This medicine may be used for other purposes; ask your health care provider or pharmacist if you have questions. COMMON BRAND NAME(S): Dacogen What should I tell my care team before I take this medication? They need to know if you have any of these conditions: Infection Kidney disease Liver disease An unusual or allergic reaction to decitabine, other medications, foods, dyes, or preservatives Pregnant or trying to get pregnant Breast-feeding How should I use this medication? This medication is infused into a vein. It is given by your care team in a hospital or clinic setting. Talk to your care team about the use of this medication in children. Special care may be needed. Overdosage: If you think you have taken too much of this medicine contact a poison control center or emergency room at once. NOTE: This medicine is only for you. Do not share this medicine with others. What if I miss a dose? Keep appointments for follow-up doses. It is important not to miss your dose. Call your care team if you are unable to keep an appointment. What may interact with this medication? Interactions are not expected. This list may not describe all possible interactions. Give your health care provider a list of all the medicines, herbs, non-prescription drugs, or dietary supplements you use. Also tell them if you smoke, drink alcohol, or use illegal drugs. Some items may interact with your medicine. What should I watch for while using this medication? Visit your care team for regular checks on your progress. You may need blood work while taking this medication. This medication may make you feel generally unwell. This is not uncommon as chemotherapy can affect healthy cells as well as cancer cells. Report any side effects. Continue your course of  treatment even though you feel ill unless your care team tells you to stop. This medication may increase your risk of getting an infection. Call your care team for advice if you get a fever, chills, sore throat, or other symptoms of a cold or flu. Do not treat yourself. Try to avoid being around people who are sick. Be careful brushing or flossing your teeth or using a toothpick because you may get an infection or bleed more easily. If you have any dental work done, tell your dentist you are receiving this medication. Call your care team if you are around anyone with measles, chickenpox, or if you develop sores or blisters that do not heal properly. Avoid taking medications that contain aspirin, acetaminophen, ibuprofen, naproxen, or ketoprofen unless instructed by your care team. These medications may hide a fever. Talk to your care team if you or your partner wish to become pregnant or think either of you might be pregnant. This medication can cause serious birth defects if taken during pregnancy or for 6 months after the last dose. A negative pregnancy test is required before starting this medication. A reliable form of contraception is recommended while taking this medication and for 6 months after the last dose. Talk to your care team about reliable forms of contraception. Do not father a child while taking this medication or for 3 months after the last dose. Use a condom while having sex during this time period. Do not breast-feed while taking this medication and for 2 weeks after the last dose. This medication may cause infertility. Talk to your care team if  you are concerned about your fertility. What side effects may I notice from receiving this medication? Side effects that you should report to your care team as soon as possible: Allergic reactions--skin rash, itching, hives, swelling of the face, lips, tongue, or throat Infection--fever, chills, cough, sore throat, wounds that don't heal, pain  or trouble when passing urine, general feeling of discomfort or being unwell Low red blood cell level--unusual weakness or fatigue, dizziness, headache, trouble breathing Unusual bruising or bleeding Side effects that usually do not require medical attention (report to your care team if they continue or are bothersome): Constipation Diarrhea Fatigue Nausea Pain, redness, or swelling with sores inside the mouth or throat Stomach pain This list may not describe all possible side effects. Call your doctor for medical advice about side effects. You may report side effects to FDA at 1-800-FDA-1088. Where should I keep my medication? This medication is given in a hospital or clinic. It will not be stored at home. NOTE: This sheet is a summary. It may not cover all possible information. If you have questions about this medicine, talk to your doctor, pharmacist, or health care provider.  2024 Elsevier/Gold Standard (2021-05-22 00:00:00)

## 2024-01-04 NOTE — Progress Notes (Unsigned)
 9 Hematology and Oncology Follow Up Visit  Patrick [name] [id number] 06/01/1940 83 y.o. 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 comes in for follow-up.  He will back to [facility].  They did do another bone marrow biopsy on him.  Thankfully, there was no evidence of obvious acute myeloid leukemia.  There is no evidence of hairy cell leukemia.  Flow cytometry, there was reported to be some residual AML.  We now will have him on decitabine for 2 cycles.  He will get this every 28 days.  We will include the venetoclax  with this.  His wife, unfortunately, had to go to the ER over the weekend because of an infected hand from his cat scratch.  She hopefully will do well.  His appetite is doing okay.  He has had no nausea or vomiting.  He has had no cough.  He has had no bleeding..  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  He has had no headache.  Overall, I will say that his performance status is probably ECOG 1.    Medications:  Current Outpatient Medications:    amLODipine  (NORVASC ) 2.5 MG tablet, Take 3 tablets (7.5 mg total) by mouth daily. Do not take if SBP < 110, Disp: 270 tablet, Rfl: 2   atorvastatin  (LIPITOR) 10 MG tablet, TAKE ONE TABLET BY MOUTH ONE TIME DAILY, Disp: 90 tablet, Rfl: 2   diphenoxylate -atropine  (LOMOTIL ) 2.5-0.025 MG tablet, Take 1 tablet by mouth 4 (four) times daily as needed for diarrhea or loose stools., Disp: 60 tablet, Rfl: 1   enoxaparin  (LOVENOX ) 120 MG/0.8ML injection, Inject 0.8 mLs (120 mg total) into the skin daily., Disp: 24 mL, Rfl: 12   famciclovir  (FAMVIR ) 250 MG tablet, Take 250  mg by mouth daily., Disp: , Rfl:    hydrocortisone 2.5 % cream, Apply 1 Application topically daily as needed., Disp: , Rfl:    itraconazole  (SPORANOX ) 100 MG capsule, Take 100 mg by mouth., Disp: , Rfl:    levofloxacin  (LEVAQUIN ) 250 MG tablet, TAKE ONE TABLET BY MOUTH ONE TIME DAILY, Disp: 30 tablet, Rfl: 4   ondansetron  (ZOFRAN ) 8 MG tablet, Take 1 tablet (8 mg total) by mouth every 8 (eight) hours as needed., Disp: 20 tablet, Rfl: 2   pantoprazole  (PROTONIX ) 40 MG tablet, Take 40 mg by mouth daily., Disp: , Rfl:    prochlorperazine  (COMPAZINE ) 10 MG tablet, Take 1 tablet (10 mg total) by mouth every 6 (six) hours as needed for nausea or vomiting., Disp: 30 tablet, Rfl: 2   rOPINIRole  (REQUIP ) 0.25 MG tablet, Take 1 tablet (0.25 mg total) by mouth 3 (three) times daily. Take 1 tablet 1 hour before bedtime.  In 3 days, take 2 tablets 1 hour before bedtime.  In 1 week take 4 tablets 1 hour before bedtime, Disp: 60 tablet, Rfl: 2   SUMAtriptan  (IMITREX ) 25 MG tablet, TAKE ONE TABLET BY MOUTH AT ONSET OF HEADACHE. MAY REPEAT DOSE WITH ONE TABLET IN TWO HOURS IF NEEDED. DO NOT EXCEED TWO TABLETS IN 24 HOURS., Disp: 12 tablet, Rfl: 0   MYRBETRIQ  25 MG TB24 tablet, Take 1 tablet (  25 mg total) by mouth daily. (Patient not taking: Reported on 01/04/2024), Disp: 90 tablet, Rfl: 3   Sodium Chloride  Flush (NORMAL SALINE FLUSH) 0.9 % SOLN, Inject 10 mLs into the PICC line daily., Disp: 300 mL, Rfl: 1   tadalafil  (CIALIS ) 5 MG tablet, Take 1 tablet (5 mg total) by mouth daily. (Patient not taking: Reported on 01/04/2024), Disp: 90 tablet, Rfl: 3   venetoclax  (VENCLEXTA ) 50 MG tablet, Take 1 tablet (50 mg total) by mouth daily. Tablets should be swallowed whole with a meal and a full glass of water. (Patient not taking: Reported on 01/04/2024), Disp: 28 tablet, Rfl: 0 No current facility-administered medications for this visit.  Facility-Administered Medications Ordered in Other Visits:    phosphorus (K PHOS  NEUTRAL) tablet 1,000 mg, 1,000 mg, Oral, Once, Sayana Salley, Maude SAUNDERS, MD   prochlorperazine  (COMPAZINE ) tablet 10 mg, 10 mg, Oral, Once, Reda Citron, Maude SAUNDERS, MD  Allergies:  Allergies  Allergen Reactions   Codeine  Rash, Dermatitis and Other (See Comments)    ...   Terbinafine Itching    Past Medical History, Surgical history, Social history, and Family History were reviewed and updated.  Review of Systems: Review of Systems  Constitutional: Negative.   HENT:  Negative.    Eyes: Negative.   Respiratory: Negative.    Cardiovascular: Negative.   Gastrointestinal: Negative.   Endocrine: Negative.   Genitourinary: Negative.    Musculoskeletal: Negative.   Skin: Negative.   Neurological: Negative.   Hematological: Negative.   Psychiatric/Behavioral: Negative.      Physical Exam:  Vital signs show temperature of 97.6.  Pulse 49.  Blood pressure 136/77.  Weight is 160 pounds.  Wt Readings from Last 3 Encounters:  12/07/23 162 lb (73.5 kg)  11/09/23 164 lb (74.4 kg)  10/05/23 165 lb 6.4 oz (75 kg)    Physical Exam Vitals reviewed.  HENT:     Head: Normocephalic and atraumatic.  Eyes:     Pupils: Pupils are equal, round, and reactive to light.     Comments: Ocular exam does show the conjunctival hemorrhage with the right eye.  Pupil reacts appropriately.  He has good extraocular muscle movement.  Cardiovascular:     Rate and Rhythm: Normal rate and regular rhythm.     Heart sounds: Normal heart sounds.     Comments: Cardiac exam shows a regular rate and rhythm.  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  I hear no murmurs or rubs or bruits. Pulmonary:     Effort: Pulmonary effort is normal.     Breath sounds: Normal breath sounds.  Abdominal:     General: Bowel sounds are normal.     Palpations: Abdomen is soft.  Musculoskeletal:        General: No tenderness or deformity. Normal range of motion.     Cervical back: Normal range of motion.   Lymphadenopathy:     Cervical: No cervical adenopathy.  Skin:    General: Skin is warm and dry.     Findings: No erythema or rash.  Neurological:     Mental Status: He is alert and oriented to person, place, and time.  Psychiatric:        Behavior: Behavior normal.        Thought Content: Thought content normal.        Judgment: Judgment normal.      Lab Results  Component Value Date   WBC 3.8 (L) 01/04/2024   HGB 12.7 (L)  01/04/2024   HCT 37.7 (L) 01/04/2024   MCV 101.6 (H) 01/04/2024   PLT 115 (L) 01/04/2024     Chemistry      Component Value Date/Time   NA 141 01/04/2024 0802   NA 139 11/12/2020 1030   K 4.5 01/04/2024 0802   CL 106 01/04/2024 0802   CO2 27 01/04/2024 0802   BUN 15 01/04/2024 0802   BUN 16 11/12/2020 1030   CREATININE 1.06 01/04/2024 0802      Component Value Date/Time   CALCIUM  9.9 01/04/2024 0802   ALKPHOS 71 01/04/2024 0802   AST 25 01/04/2024 0802   ALT 23 01/04/2024 0802   BILITOT 0.5 01/04/2024 0802       Impression and Plan: Mr. Propes is a 83 year old white male.  He has mild leukopenia and thrombocytopenia. I did do a bone marrow biopsy on him.  This confirmed that he has myelodysplasia.  Again, I would classify him as refractory anemia with excess blast-1  (RAEB-1).  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  This was proven by bone marrow biopsy done at [facility].  He underwent protocol treatment down at [facility].  He responded incredibly well.  He is in remission right now.  He completed his most recent cycle of treatment appear about a week or so ago.  He currently is on antibiotic prophylaxis.  His white cell count is coming back up.  His platelet count has gone down which I am not surprised.  I think this is some the we just have to watch.  He needs to have blood work done twice a week for right now.  I will see about having to come back on Thursday.  It will be interesting to see how things go formed out at [facility].  I think is going to have another bone marrow test down there.  I had to believe that he is going to be in remission.  I am sure that we will get him started on his next cycle of chemotherapy.  I think he will alternate to different protocols.  We will have to adjust his Lovenox  dose based on his platelet count.  I will plan to see him back when he is going to start his next cycle.   Maude JONELLE Crease, MD 11/10/20258:58 AM

## 2024-01-05 ENCOUNTER — Other Ambulatory Visit: Payer: Self-pay | Admitting: *Deleted

## 2024-01-05 ENCOUNTER — Inpatient Hospital Stay

## 2024-01-05 ENCOUNTER — Other Ambulatory Visit: Payer: Self-pay

## 2024-01-05 VITALS — BP 147/79 | HR 58 | Temp 97.9°F | Resp 20

## 2024-01-05 DIAGNOSIS — D46Z Other myelodysplastic syndromes: Secondary | ICD-10-CM

## 2024-01-05 DIAGNOSIS — Z5111 Encounter for antineoplastic chemotherapy: Secondary | ICD-10-CM | POA: Diagnosis not present

## 2024-01-05 DIAGNOSIS — Z7901 Long term (current) use of anticoagulants: Secondary | ICD-10-CM | POA: Diagnosis not present

## 2024-01-05 DIAGNOSIS — Z79899 Other long term (current) drug therapy: Secondary | ICD-10-CM | POA: Diagnosis not present

## 2024-01-05 DIAGNOSIS — Z952 Presence of prosthetic heart valve: Secondary | ICD-10-CM | POA: Diagnosis not present

## 2024-01-05 DIAGNOSIS — D4621 Refractory anemia with excess of blasts 1: Secondary | ICD-10-CM

## 2024-01-05 DIAGNOSIS — C9201 Acute myeloblastic leukemia, in remission: Secondary | ICD-10-CM

## 2024-01-05 DIAGNOSIS — D469 Myelodysplastic syndrome, unspecified: Secondary | ICD-10-CM | POA: Diagnosis not present

## 2024-01-05 DIAGNOSIS — C9200 Acute myeloblastic leukemia, not having achieved remission: Secondary | ICD-10-CM | POA: Diagnosis not present

## 2024-01-05 MED ORDER — LEVOFLOXACIN 500 MG PO TABS: 250.0000 mg | ORAL_TABLET | Freq: Every day | ORAL | Status: DC

## 2024-01-05 MED ORDER — SODIUM CHLORIDE 0.9 % IV SOLN
20.0000 mg/m2 | Freq: Once | INTRAVENOUS | Status: AC
  Administered 2024-01-05: 40 mg via INTRAVENOUS
  Filled 2024-01-05: qty 8

## 2024-01-05 MED ORDER — LEVOFLOXACIN 500 MG PO TABS: 500.0000 mg | ORAL_TABLET | Freq: Every day | ORAL | 2 refills | Status: AC

## 2024-01-05 MED ORDER — POSACONAZOLE 100 MG PO TBEC: 300.0000 mg | DELAYED_RELEASE_TABLET | Freq: Every day | ORAL | Status: AC

## 2024-01-05 MED ORDER — SODIUM CHLORIDE 0.9 % IV SOLN: INTRAVENOUS | Status: DC

## 2024-01-05 MED ORDER — ONDANSETRON HCL 8 MG PO TABS: 8.0000 mg | ORAL_TABLET | Freq: Three times a day (TID) | ORAL | 2 refills | Status: DC | PRN

## 2024-01-05 MED ORDER — PROCHLORPERAZINE MALEATE 10 MG PO TABS: 10.0000 mg | ORAL_TABLET | Freq: Once | ORAL | Status: DC

## 2024-01-05 NOTE — Progress Notes (Signed)
 Specialty Pharmacy Refill Coordination Note  Patrick [name] is a 83 y.o. male contacted today regarding refills of specialty medication(s) Venetoclax  (VENCLEXTA )   Patient requested Delivery   Delivery date: 01/07/24   Verified address: [address]  [place] [address]   Medication will be filled on: 01/06/24

## 2024-01-05 NOTE — Patient Instructions (Addendum)
 Continue Levaquin  500 mg daily Continue Posaconazole  300 mg daily Continue Famciclovir  250 mg daily Stop Itraconazole  (in the same family as Posaconazole )   [facility] - A DEPT OF [facility]  Discharge Instructions: Thank you for choosing [facility] to provide your oncology and hematology care.   If you have a lab appointment with the Cancer Center, please go directly to the Cancer Center and check in at the registration area.  Wear comfortable clothing and clothing appropriate for easy access to any Portacath or PICC line.   We strive to give you quality time with your provider. You may need to reschedule your appointment if you arrive late (15 or more minutes).  Arriving late affects you and other patients whose appointments are after yours.  Also, if you miss three or more appointments without notifying the office, you may be dismissed from the clinic at the provider's discretion.      For prescription refill requests, have your pharmacy contact our office and allow 72 hours for refills to be completed.    Today you received the following chemotherapy and/or immunotherapy agents:  Dacogen      To help prevent nausea and vomiting after your treatment, we encourage you to take your nausea medication as directed.  BELOW ARE SYMPTOMS THAT SHOULD BE REPORTED IMMEDIATELY: *FEVER GREATER THAN 100.4 F (38 C) OR HIGHER *CHILLS OR SWEATING *NAUSEA AND VOMITING THAT IS NOT CONTROLLED WITH YOUR NAUSEA MEDICATION *UNUSUAL SHORTNESS OF BREATH *UNUSUAL BRUISING OR BLEEDING *URINARY PROBLEMS (pain or burning when urinating, or frequent urination) *BOWEL PROBLEMS (unusual diarrhea, constipation, pain near the anus) TENDERNESS IN MOUTH AND THROAT WITH OR WITHOUT PRESENCE OF ULCERS (sore throat, sores in mouth, or a toothache) UNUSUAL RASH, SWELLING OR PAIN  UNUSUAL VAGINAL DISCHARGE OR ITCHING   Items with * indicate a potential emergency and  should be followed up as soon as possible or go to the Emergency Department if any problems should occur.  Please show the CHEMOTHERAPY ALERT CARD or IMMUNOTHERAPY ALERT CARD at check-in to the Emergency Department and triage nurse. Should you have questions after your visit or need to cancel or reschedule your appointment, please contact [facility] - A DEPT OF [facility]  [phone] and follow the prompts.  Office hours are 8:00 a.m. to 4:30 p.m. Monday - Friday. Please note that voicemails left after 4:00 p.m. may not be returned until the following business day.  We are closed weekends and major holidays. You have access to a nurse at all times for urgent questions. Please call the main number to the clinic [phone] and follow the prompts.  For any non-urgent questions, you may also contact your provider using MyChart. We now offer e-Visits for anyone 71 and older to request care online for non-urgent symptoms. For details visit [email or web address].   Also download the MyChart app! Go to the app store, search MyChart, open the app, select [place], and log in with your MyChart username and password.

## 2024-01-05 NOTE — Progress Notes (Signed)
 At time of discharge with flushing of PICC, pt states that he felt a shooting feeling of numbness and tingling along his arm which went to his shoulder.  This feeling went away within 2-3 minutes.  Pt states that this is common for him to feel this way if PICC is flushed too fast.  Pt refuses to stay for further observation after symptoms relieved.  PICC placement checked on 12/18/23 after pt complained of similar symptoms.

## 2024-01-05 NOTE — Progress Notes (Signed)
 Specialty Pharmacy Ongoing Clinical Assessment Note  Patrick [name] is a 83 y.o. male who is being followed by the specialty pharmacy service for RxSp Oncology   Patient's specialty medication(s) reviewed today: Venetoclax  (VENCLEXTA )   Missed doses in the last 4 weeks: 0   Patient/Caregiver did not have any additional questions or concerns.   Therapeutic benefit summary: Patient is achieving benefit   Adverse events/side effects summary: No adverse events/side effects   Patient's therapy is appropriate to: Continue    Goals Addressed             This Visit's Progress    Slow Disease Progression   No change    Patient is on track. Patient will maintain adherence         Follow up: 3 months  Silvano [name] Specialty Pharmacist

## 2024-01-06 ENCOUNTER — Other Ambulatory Visit: Payer: Self-pay

## 2024-01-06 ENCOUNTER — Inpatient Hospital Stay

## 2024-01-06 ENCOUNTER — Other Ambulatory Visit (HOSPITAL_COMMUNITY): Payer: Self-pay

## 2024-01-06 ENCOUNTER — Encounter: Payer: Self-pay | Admitting: Hematology & Oncology

## 2024-01-06 VITALS — BP 144/65 | HR 52 | Temp 97.7°F | Resp 18

## 2024-01-06 DIAGNOSIS — Z5111 Encounter for antineoplastic chemotherapy: Secondary | ICD-10-CM | POA: Diagnosis not present

## 2024-01-06 DIAGNOSIS — D4621 Refractory anemia with excess of blasts 1: Secondary | ICD-10-CM

## 2024-01-06 DIAGNOSIS — Z7901 Long term (current) use of anticoagulants: Secondary | ICD-10-CM | POA: Diagnosis not present

## 2024-01-06 DIAGNOSIS — Z79899 Other long term (current) drug therapy: Secondary | ICD-10-CM | POA: Diagnosis not present

## 2024-01-06 DIAGNOSIS — Z952 Presence of prosthetic heart valve: Secondary | ICD-10-CM | POA: Diagnosis not present

## 2024-01-06 DIAGNOSIS — D46Z Other myelodysplastic syndromes: Secondary | ICD-10-CM

## 2024-01-06 DIAGNOSIS — C9201 Acute myeloblastic leukemia, in remission: Secondary | ICD-10-CM

## 2024-01-06 DIAGNOSIS — C9200 Acute myeloblastic leukemia, not having achieved remission: Secondary | ICD-10-CM | POA: Diagnosis not present

## 2024-01-06 MED ORDER — SODIUM CHLORIDE 0.9 % IV SOLN
20.0000 mg/m2 | Freq: Once | INTRAVENOUS | Status: AC
  Administered 2024-01-06: 40 mg via INTRAVENOUS
  Filled 2024-01-06: qty 8

## 2024-01-06 MED ORDER — SODIUM CHLORIDE 0.9 % IV SOLN: INTRAVENOUS | Status: DC

## 2024-01-06 MED ORDER — PROCHLORPERAZINE MALEATE 10 MG PO TABS: 10.0000 mg | ORAL_TABLET | Freq: Once | ORAL | Status: DC

## 2024-01-06 NOTE — Patient Instructions (Signed)
 [facility] - A DEPT OF [facility]  Discharge Instructions: Thank you for choosing [facility] to provide your oncology and hematology care.   If you have a lab appointment with the Cancer Center, please go directly to the Cancer Center and check in at the registration area.  Wear comfortable clothing and clothing appropriate for easy access to any Portacath or PICC line.   We strive to give you quality time with your provider. You may need to reschedule your appointment if you arrive late (15 or more minutes).  Arriving late affects you and other patients whose appointments are after yours.  Also, if you miss three or more appointments without notifying the office, you may be dismissed from the clinic at the provider's discretion.      For prescription refill requests, have your pharmacy contact our office and allow 72 hours for refills to be completed.    Today you received the following chemotherapy and/or immunotherapy agents Dacogen      To help prevent nausea and vomiting after your treatment, we encourage you to take your nausea medication as directed.  BELOW ARE SYMPTOMS THAT SHOULD BE REPORTED IMMEDIATELY: *FEVER GREATER THAN 100.4 F (38 C) OR HIGHER *CHILLS OR SWEATING *NAUSEA AND VOMITING THAT IS NOT CONTROLLED WITH YOUR NAUSEA MEDICATION *UNUSUAL SHORTNESS OF BREATH *UNUSUAL BRUISING OR BLEEDING *URINARY PROBLEMS (pain or burning when urinating, or frequent urination) *BOWEL PROBLEMS (unusual diarrhea, constipation, pain near the anus) TENDERNESS IN MOUTH AND THROAT WITH OR WITHOUT PRESENCE OF ULCERS (sore throat, sores in mouth, or a toothache) UNUSUAL RASH, SWELLING OR PAIN  UNUSUAL VAGINAL DISCHARGE OR ITCHING   Items with * indicate a potential emergency and should be followed up as soon as possible or go to the Emergency Department if any problems should occur.  Please show the CHEMOTHERAPY ALERT CARD or IMMUNOTHERAPY  ALERT CARD at check-in to the Emergency Department and triage nurse. Should you have questions after your visit or need to cancel or reschedule your appointment, please contact [facility] - A DEPT OF [facility]  [phone] and follow the prompts.  Office hours are 8:00 a.m. to 4:30 p.m. Monday - Friday. Please note that voicemails left after 4:00 p.m. may not be returned until the following business day.  We are closed weekends and major holidays. You have access to a nurse at all times for urgent questions. Please call the main number to the clinic [phone] and follow the prompts.  For any non-urgent questions, you may also contact your provider using MyChart. We now offer e-Visits for anyone 1 and older to request care online for non-urgent symptoms. For details visit [email or web address].   Also download the MyChart app! Go to the app store, search "MyChart", open the app, select [place], and log in with your MyChart username and password.

## 2024-01-07 ENCOUNTER — Inpatient Hospital Stay

## 2024-01-07 ENCOUNTER — Encounter: Payer: Self-pay | Admitting: Hematology & Oncology

## 2024-01-07 VITALS — BP 128/64 | HR 61 | Temp 97.8°F | Resp 20

## 2024-01-07 DIAGNOSIS — Z79899 Other long term (current) drug therapy: Secondary | ICD-10-CM | POA: Diagnosis not present

## 2024-01-07 DIAGNOSIS — Z5111 Encounter for antineoplastic chemotherapy: Secondary | ICD-10-CM | POA: Diagnosis not present

## 2024-01-07 DIAGNOSIS — Z85828 Personal history of other malignant neoplasm of skin: Secondary | ICD-10-CM | POA: Diagnosis not present

## 2024-01-07 DIAGNOSIS — L812 Freckles: Secondary | ICD-10-CM | POA: Diagnosis not present

## 2024-01-07 DIAGNOSIS — L820 Inflamed seborrheic keratosis: Secondary | ICD-10-CM | POA: Diagnosis not present

## 2024-01-07 DIAGNOSIS — D469 Myelodysplastic syndrome, unspecified: Secondary | ICD-10-CM | POA: Diagnosis not present

## 2024-01-07 DIAGNOSIS — D225 Melanocytic nevi of trunk: Secondary | ICD-10-CM | POA: Diagnosis not present

## 2024-01-07 DIAGNOSIS — C9201 Acute myeloblastic leukemia, in remission: Secondary | ICD-10-CM

## 2024-01-07 DIAGNOSIS — Z952 Presence of prosthetic heart valve: Secondary | ICD-10-CM | POA: Diagnosis not present

## 2024-01-07 DIAGNOSIS — L821 Other seborrheic keratosis: Secondary | ICD-10-CM | POA: Diagnosis not present

## 2024-01-07 DIAGNOSIS — C9200 Acute myeloblastic leukemia, not having achieved remission: Secondary | ICD-10-CM | POA: Diagnosis not present

## 2024-01-07 DIAGNOSIS — D4621 Refractory anemia with excess of blasts 1: Secondary | ICD-10-CM

## 2024-01-07 DIAGNOSIS — Z7901 Long term (current) use of anticoagulants: Secondary | ICD-10-CM | POA: Diagnosis not present

## 2024-01-07 DIAGNOSIS — L570 Actinic keratosis: Secondary | ICD-10-CM | POA: Diagnosis not present

## 2024-01-07 DIAGNOSIS — D1801 Hemangioma of skin and subcutaneous tissue: Secondary | ICD-10-CM | POA: Diagnosis not present

## 2024-01-07 DIAGNOSIS — L578 Other skin changes due to chronic exposure to nonionizing radiation: Secondary | ICD-10-CM | POA: Diagnosis not present

## 2024-01-07 DIAGNOSIS — D46Z Other myelodysplastic syndromes: Secondary | ICD-10-CM

## 2024-01-07 MED ORDER — SODIUM CHLORIDE 0.9 % IV SOLN: INTRAVENOUS | Status: DC

## 2024-01-07 MED ORDER — SODIUM CHLORIDE 0.9 % IV SOLN
20.0000 mg/m2 | Freq: Once | INTRAVENOUS | Status: AC
  Administered 2024-01-07: 40 mg via INTRAVENOUS
  Filled 2024-01-07: qty 8

## 2024-01-07 MED ORDER — PROCHLORPERAZINE MALEATE 10 MG PO TABS: 10.0000 mg | ORAL_TABLET | Freq: Once | ORAL | Status: DC

## 2024-01-07 NOTE — Patient Instructions (Signed)
 [facility] - A DEPT OF [facility]  Discharge Instructions: Thank you for choosing [facility] to provide your oncology and hematology care.   If you have a lab appointment with the Cancer Center, please go directly to the Cancer Center and check in at the registration area.  Wear comfortable clothing and clothing appropriate for easy access to any Portacath or PICC line.   We strive to give you quality time with your provider. You may need to reschedule your appointment if you arrive late (15 or more minutes).  Arriving late affects you and other patients whose appointments are after yours.  Also, if you miss three or more appointments without notifying the office, you may be dismissed from the clinic at the provider's discretion.      For prescription refill requests, have your pharmacy contact our office and allow 72 hours for refills to be completed.    Today you received the following chemotherapy and/or immunotherapy agents Dacogen      To help prevent nausea and vomiting after your treatment, we encourage you to take your nausea medication as directed.  BELOW ARE SYMPTOMS THAT SHOULD BE REPORTED IMMEDIATELY: *FEVER GREATER THAN 100.4 F (38 C) OR HIGHER *CHILLS OR SWEATING *NAUSEA AND VOMITING THAT IS NOT CONTROLLED WITH YOUR NAUSEA MEDICATION *UNUSUAL SHORTNESS OF BREATH *UNUSUAL BRUISING OR BLEEDING *URINARY PROBLEMS (pain or burning when urinating, or frequent urination) *BOWEL PROBLEMS (unusual diarrhea, constipation, pain near the anus) TENDERNESS IN MOUTH AND THROAT WITH OR WITHOUT PRESENCE OF ULCERS (sore throat, sores in mouth, or a toothache) UNUSUAL RASH, SWELLING OR PAIN  UNUSUAL VAGINAL DISCHARGE OR ITCHING   Items with * indicate a potential emergency and should be followed up as soon as possible or go to the Emergency Department if any problems should occur.  Please show the CHEMOTHERAPY ALERT CARD or IMMUNOTHERAPY  ALERT CARD at check-in to the Emergency Department and triage nurse. Should you have questions after your visit or need to cancel or reschedule your appointment, please contact [facility] - A DEPT OF [facility]  [phone] and follow the prompts.  Office hours are 8:00 a.m. to 4:30 p.m. Monday - Friday. Please note that voicemails left after 4:00 p.m. may not be returned until the following business day.  We are closed weekends and major holidays. You have access to a nurse at all times for urgent questions. Please call the main number to the clinic [phone] and follow the prompts.  For any non-urgent questions, you may also contact your provider using MyChart. We now offer e-Visits for anyone 1 and older to request care online for non-urgent symptoms. For details visit [email or web address].   Also download the MyChart app! Go to the app store, search "MyChart", open the app, select [place], and log in with your MyChart username and password.

## 2024-01-08 ENCOUNTER — Other Ambulatory Visit: Payer: Self-pay

## 2024-01-08 ENCOUNTER — Inpatient Hospital Stay

## 2024-01-08 ENCOUNTER — Other Ambulatory Visit (HOSPITAL_COMMUNITY): Payer: Self-pay

## 2024-01-08 VITALS — BP 131/73 | HR 57 | Temp 97.9°F | Resp 20

## 2024-01-08 DIAGNOSIS — D469 Myelodysplastic syndrome, unspecified: Secondary | ICD-10-CM | POA: Diagnosis not present

## 2024-01-08 DIAGNOSIS — Z952 Presence of prosthetic heart valve: Secondary | ICD-10-CM | POA: Diagnosis not present

## 2024-01-08 DIAGNOSIS — D46Z Other myelodysplastic syndromes: Secondary | ICD-10-CM

## 2024-01-08 DIAGNOSIS — Z5111 Encounter for antineoplastic chemotherapy: Secondary | ICD-10-CM | POA: Diagnosis not present

## 2024-01-08 DIAGNOSIS — Z7901 Long term (current) use of anticoagulants: Secondary | ICD-10-CM | POA: Diagnosis not present

## 2024-01-08 DIAGNOSIS — Z79899 Other long term (current) drug therapy: Secondary | ICD-10-CM | POA: Diagnosis not present

## 2024-01-08 DIAGNOSIS — C9200 Acute myeloblastic leukemia, not having achieved remission: Secondary | ICD-10-CM | POA: Diagnosis not present

## 2024-01-08 DIAGNOSIS — C9201 Acute myeloblastic leukemia, in remission: Secondary | ICD-10-CM

## 2024-01-08 DIAGNOSIS — D4621 Refractory anemia with excess of blasts 1: Secondary | ICD-10-CM

## 2024-01-08 MED ORDER — SODIUM CHLORIDE 0.9 % IV SOLN: INTRAVENOUS | Status: DC

## 2024-01-08 MED ORDER — PROCHLORPERAZINE MALEATE 10 MG PO TABS: 10.0000 mg | ORAL_TABLET | Freq: Once | ORAL | Status: DC

## 2024-01-08 MED ORDER — SODIUM CHLORIDE 0.9 % IV SOLN
20.0000 mg/m2 | Freq: Once | INTRAVENOUS | Status: AC
  Administered 2024-01-08: 40 mg via INTRAVENOUS
  Filled 2024-01-08: qty 8

## 2024-01-08 NOTE — Patient Instructions (Signed)
 [facility] - A DEPT OF [facility]  Discharge Instructions: Thank you for choosing [facility] to provide your oncology and hematology care.   If you have a lab appointment with the Cancer Center, please go directly to the Cancer Center and check in at the registration area.  Wear comfortable clothing and clothing appropriate for easy access to any Portacath or PICC line.   We strive to give you quality time with your provider. You may need to reschedule your appointment if you arrive late (15 or more minutes).  Arriving late affects you and other patients whose appointments are after yours.  Also, if you miss three or more appointments without notifying the office, you may be dismissed from the clinic at the provider's discretion.      For prescription refill requests, have your pharmacy contact our office and allow 72 hours for refills to be completed.    Today you received the following chemotherapy and/or immunotherapy agents Dacogen      To help prevent nausea and vomiting after your treatment, we encourage you to take your nausea medication as directed.  BELOW ARE SYMPTOMS THAT SHOULD BE REPORTED IMMEDIATELY: *FEVER GREATER THAN 100.4 F (38 C) OR HIGHER *CHILLS OR SWEATING *NAUSEA AND VOMITING THAT IS NOT CONTROLLED WITH YOUR NAUSEA MEDICATION *UNUSUAL SHORTNESS OF BREATH *UNUSUAL BRUISING OR BLEEDING *URINARY PROBLEMS (pain or burning when urinating, or frequent urination) *BOWEL PROBLEMS (unusual diarrhea, constipation, pain near the anus) TENDERNESS IN MOUTH AND THROAT WITH OR WITHOUT PRESENCE OF ULCERS (sore throat, sores in mouth, or a toothache) UNUSUAL RASH, SWELLING OR PAIN  UNUSUAL VAGINAL DISCHARGE OR ITCHING   Items with * indicate a potential emergency and should be followed up as soon as possible or go to the Emergency Department if any problems should occur.  Please show the CHEMOTHERAPY ALERT CARD or IMMUNOTHERAPY  ALERT CARD at check-in to the Emergency Department and triage nurse. Should you have questions after your visit or need to cancel or reschedule your appointment, please contact [facility] - A DEPT OF [facility]  [phone] and follow the prompts.  Office hours are 8:00 a.m. to 4:30 p.m. Monday - Friday. Please note that voicemails left after 4:00 p.m. may not be returned until the following business day.  We are closed weekends and major holidays. You have access to a nurse at all times for urgent questions. Please call the main number to the clinic [phone] and follow the prompts.  For any non-urgent questions, you may also contact your provider using MyChart. We now offer e-Visits for anyone 1 and older to request care online for non-urgent symptoms. For details visit [email or web address].   Also download the MyChart app! Go to the app store, search "MyChart", open the app, select [place], and log in with your MyChart username and password.

## 2024-01-11 ENCOUNTER — Inpatient Hospital Stay

## 2024-01-11 DIAGNOSIS — D696 Thrombocytopenia, unspecified: Secondary | ICD-10-CM

## 2024-01-11 DIAGNOSIS — D4621 Refractory anemia with excess of blasts 1: Secondary | ICD-10-CM

## 2024-01-11 DIAGNOSIS — D72810 Lymphocytopenia: Secondary | ICD-10-CM

## 2024-01-11 DIAGNOSIS — D708 Other neutropenia: Secondary | ICD-10-CM

## 2024-01-11 DIAGNOSIS — D709 Neutropenia, unspecified: Secondary | ICD-10-CM

## 2024-01-11 DIAGNOSIS — Z79899 Other long term (current) drug therapy: Secondary | ICD-10-CM | POA: Diagnosis not present

## 2024-01-11 DIAGNOSIS — Z952 Presence of prosthetic heart valve: Secondary | ICD-10-CM | POA: Diagnosis not present

## 2024-01-11 DIAGNOSIS — Z7901 Long term (current) use of anticoagulants: Secondary | ICD-10-CM | POA: Diagnosis not present

## 2024-01-11 DIAGNOSIS — Z5111 Encounter for antineoplastic chemotherapy: Secondary | ICD-10-CM | POA: Diagnosis not present

## 2024-01-11 DIAGNOSIS — C9200 Acute myeloblastic leukemia, not having achieved remission: Secondary | ICD-10-CM | POA: Diagnosis not present

## 2024-01-11 DIAGNOSIS — D46Z Other myelodysplastic syndromes: Secondary | ICD-10-CM

## 2024-01-11 LAB — CMP (CANCER CENTER ONLY)
ALT: 28 U/L (ref 0–44)
AST: 24 U/L (ref 15–41)
Albumin: 4.6 g/dL (ref 3.5–5.0)
Alkaline Phosphatase: 68 U/L (ref 38–126)
Anion gap: 8 (ref 5–15)
BUN: 19 mg/dL (ref 8–23)
CO2: 26 mmol/L (ref 22–32)
Calcium: 10 mg/dL (ref 8.9–10.3)
Chloride: 106 mmol/L (ref 98–111)
Creatinine: 1.11 mg/dL (ref 0.61–1.24)
GFR, Estimated: >60 mL/min (ref >60)
Glucose, Bld: 121 mg/dL — ABNORMAL HIGH (ref 70–99)
Potassium: 4.6 mmol/L (ref 3.5–5.1)
Sodium: 140 mmol/L (ref 135–145)
Total Bilirubin: 0.7 mg/dL (ref 0.0–1.2)
Total Protein: 6.2 g/dL — ABNORMAL LOW (ref 6.5–8.1)

## 2024-01-11 LAB — CBC WITH DIFFERENTIAL (CANCER CENTER ONLY)
Abs Immature Granulocytes: 0.04 K/uL (ref 0.00–0.07)
Basophils Absolute: 0 K/uL (ref 0.0–0.1)
Basophils Relative: 0 %
Eosinophils Absolute: 0 K/uL (ref 0.0–0.5)
Eosinophils Relative: 1 %
HCT: 35.3 % — ABNORMAL LOW (ref 39.0–52.0)
Hemoglobin: 12.2 g/dL — ABNORMAL LOW (ref 13.0–17.0)
Immature Granulocytes: 1 %
Lymphocytes Relative: 4 %
Lymphs Abs: 0.2 K/uL — ABNORMAL LOW (ref 0.7–4.0)
MCH: 34.9 pg — ABNORMAL HIGH (ref 26.0–34.0)
MCHC: 34.6 g/dL (ref 30.0–36.0)
MCV: 100.9 fL — ABNORMAL HIGH (ref 80.0–100.0)
Monocytes Absolute: 0.2 K/uL (ref 0.1–1.0)
Monocytes Relative: 7 %
Neutro Abs: 3.2 K/uL (ref 1.7–7.7)
Neutrophils Relative %: 87 %
Platelet Count: 80 K/uL — ABNORMAL LOW (ref 150–400)
RBC: 3.5 MIL/uL — ABNORMAL LOW (ref 4.22–5.81)
RDW: 17.1 % — ABNORMAL HIGH (ref 11.5–15.5)
WBC Count: 3.6 K/uL — ABNORMAL LOW (ref 4.0–10.5)
nRBC: 0 % (ref 0.0–0.2)

## 2024-01-11 LAB — APTT: aPTT: 37 s — ABNORMAL HIGH (ref 24–36)

## 2024-01-11 LAB — FIBRINOGEN: Fibrinogen: 325 mg/dL (ref 210–475)

## 2024-01-11 LAB — SAMPLE TO BLOOD BANK

## 2024-01-11 LAB — MAGNESIUM: Magnesium: 2.1 mg/dL (ref 1.7–2.4)

## 2024-01-11 LAB — PROTIME-INR
INR: 1 (ref 0.8–1.2)
Prothrombin Time: 13.8 s (ref 11.4–15.2)

## 2024-01-11 LAB — URIC ACID: Uric Acid, Serum: 4.3 mg/dL (ref 3.7–8.6)

## 2024-01-11 LAB — LACTATE DEHYDROGENASE: LDH: 305 U/L — ABNORMAL HIGH (ref 105–235)

## 2024-01-11 LAB — PHOSPHORUS: Phosphorus: 3.1 mg/dL (ref 2.5–4.6)

## 2024-01-11 LAB — D-DIMER, QUANTITATIVE: D-Dimer, Quant: 0.94 ug{FEU}/mL — ABNORMAL HIGH (ref 0.00–0.50)

## 2024-01-11 NOTE — Patient Instructions (Signed)
 PICC Home Care Guide A peripherally inserted central catheter (PICC) is a form of IV access that allows medicines and IV fluids to be quickly put into the blood and spread throughout the body. The PICC is a long, thin, flexible tube (catheter) that is put into a vein in a person's arm or leg. The catheter ends in a large vein just outside the heart called the superior vena cava (SVC). After the PICC is put in, a chest X-ray may be done to make sure that it is in the right place. A PICC may be placed for different reasons, such as: To give medicines and liquid nutrition. To give IV fluids and blood products. To take blood samples often. If there is trouble placing a peripheral intravenous (PIV) catheter. If cared for properly, a PICC can remain in place for many months. Having a PICC can allow you to go home from the hospital sooner and continue treatment at home. Medicines and PICC care can be managed at home by a family member, caregiver, or home health care team. What are the risks? Generally, having a PICC is safe. However, problems may occur, including: A blood clot (thrombus) forming in or at the end of the PICC. A blood clot forming in a vein (deep vein thrombosis) or traveling to the lung (pulmonary embolism). Inflammation of the vein (phlebitis) in which the PICC is placed. Infection at the insertion site or in the blood. Blood infections from central lines, like PICCs, can be serious and often require a hospital stay. PICC malposition, or PICC movement or poor placement. A break or cut in the PICC. Do not use scissors near the PICC. Nerve or tendon irritation or injury during PICC insertion. How to care for your PICC Please follow the specific guidelines provided by your health care provider. Preventing infection You and any caregivers should wash your hands often with soap and water for at least 20 seconds. Wash hands: Before touching the PICC or the infusion device. Before changing a  bandage (dressing). Do not change the dressing unless you have been taught to do so and have shown you are able to change it safely. Flush the PICC as told. Tell your health care provider right away if the PICC is hard to flush or does not flush. Do not use force to flush the PICC. Use clean and germ-free (sterile) supplies only. Keep the supplies in a dry place. Do not reuse needles, syringes, or any other supplies. Reusing supplies can lead to infection. Keep the PICC dressing dry and secure it with tape if the edges stop sticking to your skin. Check your PICC insertion site every day for signs of infection. Check for: Redness, swelling, or pain. Fluid or blood. Warmth. Pus or a bad smell. Preventing other problems Do not use a syringe that is less than 10 mL to flush the PICC. Do not have your blood pressure checked on the arm in which the PICC is placed. Do not ever pull or tug on the PICC. Keep it secured to your arm with tape or a stretch wrap when not in use. Do not take the PICC out yourself. Only a trained health care provider should remove the PICC. Keep pets and children away from your PICC. How to care for your PICC dressing Keep your PICC dressing clean and dry to prevent infection. Do not take baths, swim, or use a hot tub until your health care provider approves. Ask your health care provider if you can take  showers. You may only be allowed to take sponge baths. When you are allowed to shower: Ask your health care provider to teach you how to wrap the PICC. Cover the PICC with clear plastic wrap and tape to keep it dry while showering. Follow instructions from your health care provider about how to take care of your insertion site and dressing. Make sure you: Wash your hands with soap and water for at least 20 seconds before and after you change your dressing. If soap and water are not available, use hand sanitizer. Change your dressing only if taught to do so by your health care  provider. Your PICC dressing needs to be changed if it becomes loose or wet. Leave stitches (sutures), skin glue, or adhesive strips in place. These skin closures may need to stay in place for 2 weeks or longer. If adhesive strip edges start to loosen and curl up, you may trim the loose edges. Do not remove adhesive strips completely unless your health care provider tells you to do that. Follow these instructions at home: Disposal of supplies Throw away any syringes in a disposal container that is meant for sharp items (sharps container). You can buy a sharps container from a pharmacy, or you can make one by using an empty, hard plastic bottle with a lid. Place any used dressings or infusion bags into a plastic bag. Throw that bag in the trash. General instructions  Always carry your PICC identification card or wear a medical alert bracelet. Keep the tube clamped at all times, unless it is being used. Always carry a smooth-edge clamp with you to clamp the PICC if it breaks. Do not use scissors or sharp objects near the tube. You may bend your arm and move it freely. If your PICC is near or at the bend of your elbow, avoid activity with repeated motion at the elbow. Avoid lifting heavy objects as told by your health care provider. Keep all follow-up visits. This is important. You will need to have your PICC dressing changed at least once a week. Contact a health care provider if: You have pain in your arm, ear, face, or teeth. You have a fever or chills. You have redness, swelling, or pain around the insertion site. You have fluid or blood coming from the insertion site. Your insertion site feels warm to the touch. You have pus or a bad smell coming from the insertion site. Your skin feels hard and raised around the insertion site. Your PICC dressing has gotten wet or is coming off and you have not been taught how to change it. Get help right away if: You have problems with your PICC, such as  your PICC: Was tugged or pulled and has partially come out. Do not  push the PICC back in. Cannot be flushed, is hard to flush, or leaks around the insertion site when it is flushed. Makes a flushing sound when it is flushed. Appears to have a hole or tear. Is accidentally pulled all the way out. If this happens, cover the insertion site with a gauze dressing. Do not throw the PICC away. Your health care provider will need to check it to be sure the entire catheter came out. You feel your heart racing or skipping beats, or you have chest pain. You have shortness of breath or trouble breathing. You have swelling, redness, warmth, or pain in the arm in which the PICC is placed. You have a red streak going up your arm that  starts under the PICC dressing. These symptoms may be an emergency. Get help right away. Call 911. Do not wait to see if the symptoms will go away. Do not drive yourself to the hospital. Summary A peripherally inserted central catheter (PICC) is a long, thin, flexible tube (catheter) that is put into a vein in the arm or leg. If cared for properly, a PICC can remain in place for many months. Having a PICC can allow you to go home from the hospital sooner and continue treatment at home. The PICC is inserted using a germ-free (sterile) technique by a specially trained health care provider. Only a trained health care provider should remove it. Do not have your blood pressure checked on the arm in which your PICC is placed. Always keep your PICC identification card with you. This information is not intended to replace advice given to you by your health care provider. Make sure you discuss any questions you have with your health care provider. Document Revised: 08/29/2020 Document Reviewed: 08/29/2020 Elsevier Patient Education  2024 [employer].

## 2024-01-12 ENCOUNTER — Other Ambulatory Visit: Payer: Self-pay | Admitting: Hematology & Oncology

## 2024-01-18 ENCOUNTER — Inpatient Hospital Stay

## 2024-01-18 ENCOUNTER — Other Ambulatory Visit: Payer: Self-pay | Admitting: *Deleted

## 2024-01-18 ENCOUNTER — Telehealth: Payer: Self-pay | Admitting: *Deleted

## 2024-01-18 ENCOUNTER — Telehealth: Payer: Self-pay

## 2024-01-18 ENCOUNTER — Other Ambulatory Visit: Payer: Self-pay

## 2024-01-18 DIAGNOSIS — D4621 Refractory anemia with excess of blasts 1: Secondary | ICD-10-CM

## 2024-01-18 DIAGNOSIS — C9201 Acute myeloblastic leukemia, in remission: Secondary | ICD-10-CM

## 2024-01-18 DIAGNOSIS — C9200 Acute myeloblastic leukemia, not having achieved remission: Secondary | ICD-10-CM | POA: Diagnosis not present

## 2024-01-18 DIAGNOSIS — D72810 Lymphocytopenia: Secondary | ICD-10-CM

## 2024-01-18 DIAGNOSIS — Z952 Presence of prosthetic heart valve: Secondary | ICD-10-CM | POA: Diagnosis not present

## 2024-01-18 DIAGNOSIS — D709 Neutropenia, unspecified: Secondary | ICD-10-CM

## 2024-01-18 DIAGNOSIS — Z79899 Other long term (current) drug therapy: Secondary | ICD-10-CM | POA: Diagnosis not present

## 2024-01-18 DIAGNOSIS — D708 Other neutropenia: Secondary | ICD-10-CM

## 2024-01-18 DIAGNOSIS — Z7901 Long term (current) use of anticoagulants: Secondary | ICD-10-CM | POA: Diagnosis not present

## 2024-01-18 DIAGNOSIS — D696 Thrombocytopenia, unspecified: Secondary | ICD-10-CM

## 2024-01-18 DIAGNOSIS — D46Z Other myelodysplastic syndromes: Secondary | ICD-10-CM

## 2024-01-18 DIAGNOSIS — Z5111 Encounter for antineoplastic chemotherapy: Secondary | ICD-10-CM | POA: Diagnosis not present

## 2024-01-18 LAB — PROTIME-INR
INR: 1.1 (ref 0.8–1.2)
Prothrombin Time: 14.6 s (ref 11.4–15.2)

## 2024-01-18 LAB — CBC WITH DIFFERENTIAL (CANCER CENTER ONLY)
Abs Immature Granulocytes: 0.03 K/uL (ref 0.00–0.07)
Basophils Absolute: 0 K/uL (ref 0.0–0.1)
Basophils Relative: 1 %
Eosinophils Absolute: 0 K/uL (ref 0.0–0.5)
Eosinophils Relative: 1 %
HCT: 33 % — ABNORMAL LOW (ref 39.0–52.0)
Hemoglobin: 11.3 g/dL — ABNORMAL LOW (ref 13.0–17.0)
Immature Granulocytes: 2 %
Lymphocytes Relative: 8 %
Lymphs Abs: 0.1 K/uL — ABNORMAL LOW (ref 0.7–4.0)
MCH: 34.6 pg — ABNORMAL HIGH (ref 26.0–34.0)
MCHC: 34.2 g/dL (ref 30.0–36.0)
MCV: 100.9 fL — ABNORMAL HIGH (ref 80.0–100.0)
Monocytes Absolute: 0.1 K/uL (ref 0.1–1.0)
Monocytes Relative: 6 %
Neutro Abs: 1.4 K/uL — ABNORMAL LOW (ref 1.7–7.7)
Neutrophils Relative %: 82 %
RBC: 3.27 MIL/uL — ABNORMAL LOW (ref 4.22–5.81)
RDW: 16.4 % — ABNORMAL HIGH (ref 11.5–15.5)
WBC Count: 1.7 K/uL — ABNORMAL LOW (ref 4.0–10.5)
nRBC: 0 % (ref 0.0–0.2)
nRBC: 0 /100{WBCs}

## 2024-01-18 LAB — SAMPLE TO BLOOD BANK

## 2024-01-18 LAB — CMP (CANCER CENTER ONLY)
ALT: 32 U/L (ref 0–44)
AST: 22 U/L (ref 15–41)
Albumin: 4.5 g/dL (ref 3.5–5.0)
Alkaline Phosphatase: 69 U/L (ref 38–126)
Anion gap: 8 (ref 5–15)
BUN: 16 mg/dL (ref 8–23)
CO2: 27 mmol/L (ref 22–32)
Calcium: 9.7 mg/dL (ref 8.9–10.3)
Chloride: 107 mmol/L (ref 98–111)
Creatinine: 1.06 mg/dL (ref 0.61–1.24)
GFR, Estimated: >60 mL/min (ref >60)
Glucose, Bld: 144 mg/dL — ABNORMAL HIGH (ref 70–99)
Potassium: 4.3 mmol/L (ref 3.5–5.1)
Sodium: 142 mmol/L (ref 135–145)
Total Bilirubin: 0.6 mg/dL (ref 0.0–1.2)
Total Protein: 6 g/dL — ABNORMAL LOW (ref 6.5–8.1)

## 2024-01-18 LAB — LACTATE DEHYDROGENASE: LDH: 254 U/L — ABNORMAL HIGH (ref 105–235)

## 2024-01-18 LAB — APTT: aPTT: 34 s (ref 24–36)

## 2024-01-18 LAB — MAGNESIUM: Magnesium: 2.1 mg/dL (ref 1.7–2.4)

## 2024-01-18 LAB — URIC ACID: Uric Acid, Serum: 3.6 mg/dL — ABNORMAL LOW (ref 3.7–8.6)

## 2024-01-18 LAB — PHOSPHORUS: Phosphorus: 2.8 mg/dL (ref 2.5–4.6)

## 2024-01-18 LAB — D-DIMER, QUANTITATIVE: D-Dimer, Quant: 1.36 ug{FEU}/mL — ABNORMAL HIGH (ref 0.00–0.50)

## 2024-01-18 LAB — FIBRINOGEN: Fibrinogen: 303 mg/dL (ref 210–475)

## 2024-01-18 MED ORDER — SODIUM CHLORIDE 0.9% IV SOLUTION: 250.0000 mL | INTRAVENOUS | Status: DC

## 2024-01-18 MED ORDER — VENETOCLAX 100 MG PO TABS
100.0000 mg | ORAL_TABLET | Freq: Every day | ORAL | 0 refills | Status: DC
  Filled 2024-01-18: qty 120, 120d supply, fill #0
  Filled 2024-01-19: qty 7, 7d supply, fill #0

## 2024-01-18 NOTE — Patient Instructions (Signed)
 Low Number of Platelets (Thrombocytopenia): What to Know  Thrombocytopenia means that you have a low number of platelets. Platelets are cells in your blood. When you bleed, they clump together as a clot to stop the bleeding. If you don't have enough platelets, your blood may have trouble clotting. This may cause you to bleed inside your body. What are the causes? Having a low number of platelets in your blood may happen if: Your body doesn't make enough platelets. This may be caused by: Anemia. Leukemia. Other bone marrow diseases. Your platelets don't release into your blood. This can happen if: You have a spleen that's too large. You have Gaucher disease. Your body breaks down platelets too fast. This may be caused by: An autoimmune disease. This is a problem that affects your body's defense (immune) system. Certain medicines that thin your blood. Being exposed to harmful (toxic) chemicals. Pregnancy. You have a disease that's inherited. This means it's passed from parent to child. You've had certain cancer medicines or treatments. You have an infection from germs, such as bacteria or viruses. You drink too much alcohol. What are the signs or symptoms? Bruising easy. Bleeding from your nose or mouth. Heavy periods in females. Blood in your pee, poop, or throw-up. A purple-red color to your skin. Having very small red spots on your skin that look like a rash. How is this diagnosed? You may be diagnosed with a physical exam. You may also have tests, such as: Blood tests. A biopsy. This is when a small piece of bone marrow is removed for testing. An ultrasound or CT scan of your belly. How is this treated? Treatment may include: Treating the problem that's causing the low number of platelets. Taking medicines to stop your body from breaking down platelets too fast. Being given platelets through a transfusion. Having surgery to remove your spleen. Follow these instructions at  home: Medicines Take your medicines only as told. Do not take aspirin or ibuprofen unless you're told to. These medicines can make you more likely to bleed. Activity Do not do things that could hurt or bruise you. Be careful to avoid falls. Do not play contact sports. Ask what things are safe for you to do at home. Take care not to cut yourself: When you shave. When you use scissors or other sharp tools. General instructions  Check your skin and the inside of your mouth for bruises or blood. Check your pee and poop for blood. Do not drink alcohol if your health care provider tells you not to drink. Stay away from harmful chemicals. Tell all of your providers that you have a low number of platelets. Tell your dental care provider before you have any dental work or cleanings. Wear an alert bracelet or carry a card that says you may bleed easy. Contact a health care provider if: You have bruises that you can't explain. You have any bleeding. You have blood in your: Throw-up. Pee. Poop. You have a fever. Get help right away if: You have bleeding that you can't stop. You hit your head. You have a sudden, very bad headache. This information is not intended to replace advice given to you by your health care provider. Make sure you discuss any questions you have with your health care provider. Document Revised: 04/07/2023 Document Reviewed: 04/07/2023 Elsevier Patient Education  2025 [employer].

## 2024-01-18 NOTE — Patient Instructions (Signed)
 PICC Home Care Guide A peripherally inserted central catheter (PICC) is a form of IV access that allows medicines and IV fluids to be quickly put into the blood and spread throughout the body. The PICC is a long, thin, flexible tube (catheter) that is put into a vein in a person's arm or leg. The catheter ends in a large vein just outside the heart called the superior vena cava (SVC). After the PICC is put in, a chest X-ray may be done to make sure that it is in the right place. A PICC may be placed for different reasons, such as: To give medicines and liquid nutrition. To give IV fluids and blood products. To take blood samples often. If there is trouble placing a peripheral intravenous (PIV) catheter. If cared for properly, a PICC can remain in place for many months. Having a PICC can allow you to go home from the hospital sooner and continue treatment at home. Medicines and PICC care can be managed at home by a family member, caregiver, or home health care team. What are the risks? Generally, having a PICC is safe. However, problems may occur, including: A blood clot (thrombus) forming in or at the end of the PICC. A blood clot forming in a vein (deep vein thrombosis) or traveling to the lung (pulmonary embolism). Inflammation of the vein (phlebitis) in which the PICC is placed. Infection at the insertion site or in the blood. Blood infections from central lines, like PICCs, can be serious and often require a hospital stay. PICC malposition, or PICC movement or poor placement. A break or cut in the PICC. Do not use scissors near the PICC. Nerve or tendon irritation or injury during PICC insertion. How to care for your PICC Please follow the specific guidelines provided by your health care provider. Preventing infection You and any caregivers should wash your hands often with soap and water for at least 20 seconds. Wash hands: Before touching the PICC or the infusion device. Before changing a  bandage (dressing). Do not change the dressing unless you have been taught to do so and have shown you are able to change it safely. Flush the PICC as told. Tell your health care provider right away if the PICC is hard to flush or does not flush. Do not use force to flush the PICC. Use clean and germ-free (sterile) supplies only. Keep the supplies in a dry place. Do not reuse needles, syringes, or any other supplies. Reusing supplies can lead to infection. Keep the PICC dressing dry and secure it with tape if the edges stop sticking to your skin. Check your PICC insertion site every day for signs of infection. Check for: Redness, swelling, or pain. Fluid or blood. Warmth. Pus or a bad smell. Preventing other problems Do not use a syringe that is less than 10 mL to flush the PICC. Do not have your blood pressure checked on the arm in which the PICC is placed. Do not ever pull or tug on the PICC. Keep it secured to your arm with tape or a stretch wrap when not in use. Do not take the PICC out yourself. Only a trained health care provider should remove the PICC. Keep pets and children away from your PICC. How to care for your PICC dressing Keep your PICC dressing clean and dry to prevent infection. Do not take baths, swim, or use a hot tub until your health care provider approves. Ask your health care provider if you can take  showers. You may only be allowed to take sponge baths. When you are allowed to shower: Ask your health care provider to teach you how to wrap the PICC. Cover the PICC with clear plastic wrap and tape to keep it dry while showering. Follow instructions from your health care provider about how to take care of your insertion site and dressing. Make sure you: Wash your hands with soap and water for at least 20 seconds before and after you change your dressing. If soap and water are not available, use hand sanitizer. Change your dressing only if taught to do so by your health care  provider. Your PICC dressing needs to be changed if it becomes loose or wet. Leave stitches (sutures), skin glue, or adhesive strips in place. These skin closures may need to stay in place for 2 weeks or longer. If adhesive strip edges start to loosen and curl up, you may trim the loose edges. Do not remove adhesive strips completely unless your health care provider tells you to do that. Follow these instructions at home: Disposal of supplies Throw away any syringes in a disposal container that is meant for sharp items (sharps container). You can buy a sharps container from a pharmacy, or you can make one by using an empty, hard plastic bottle with a lid. Place any used dressings or infusion bags into a plastic bag. Throw that bag in the trash. General instructions  Always carry your PICC identification card or wear a medical alert bracelet. Keep the tube clamped at all times, unless it is being used. Always carry a smooth-edge clamp with you to clamp the PICC if it breaks. Do not use scissors or sharp objects near the tube. You may bend your arm and move it freely. If your PICC is near or at the bend of your elbow, avoid activity with repeated motion at the elbow. Avoid lifting heavy objects as told by your health care provider. Keep all follow-up visits. This is important. You will need to have your PICC dressing changed at least once a week. Contact a health care provider if: You have pain in your arm, ear, face, or teeth. You have a fever or chills. You have redness, swelling, or pain around the insertion site. You have fluid or blood coming from the insertion site. Your insertion site feels warm to the touch. You have pus or a bad smell coming from the insertion site. Your skin feels hard and raised around the insertion site. Your PICC dressing has gotten wet or is coming off and you have not been taught how to change it. Get help right away if: You have problems with your PICC, such as  your PICC: Was tugged or pulled and has partially come out. Do not  push the PICC back in. Cannot be flushed, is hard to flush, or leaks around the insertion site when it is flushed. Makes a flushing sound when it is flushed. Appears to have a hole or tear. Is accidentally pulled all the way out. If this happens, cover the insertion site with a gauze dressing. Do not throw the PICC away. Your health care provider will need to check it to be sure the entire catheter came out. You feel your heart racing or skipping beats, or you have chest pain. You have shortness of breath or trouble breathing. You have swelling, redness, warmth, or pain in the arm in which the PICC is placed. You have a red streak going up your arm that  starts under the PICC dressing. These symptoms may be an emergency. Get help right away. Call 911. Do not wait to see if the symptoms will go away. Do not drive yourself to the hospital. Summary A peripherally inserted central catheter (PICC) is a long, thin, flexible tube (catheter) that is put into a vein in the arm or leg. If cared for properly, a PICC can remain in place for many months. Having a PICC can allow you to go home from the hospital sooner and continue treatment at home. The PICC is inserted using a germ-free (sterile) technique by a specially trained health care provider. Only a trained health care provider should remove it. Do not have your blood pressure checked on the arm in which your PICC is placed. Always keep your PICC identification card with you. This information is not intended to replace advice given to you by your health care provider. Make sure you discuss any questions you have with your health care provider. Document Revised: 08/29/2020 Document Reviewed: 08/29/2020 Elsevier Patient Education  2024 [employer].

## 2024-01-18 NOTE — Telephone Encounter (Signed)
 Received fax that patients platelets were 20,000.  Dr Timmy would like to recheck on Wednesday and possible platelet transfusion.  Patient is ok with this plan.  Patient called back to state that he did have a nosebleed in the middle of the night that lasted 15-20 min and is on Lovenox  but has not taken Lovenox  today.  Dr Timmy notified.  Wants to give platelets today and hold Lovenox  until further notice.  Blood bank called.  Do not have platelets at this time but will order one and let us  know when it gets here.  Patient notified and will be called when we get more information from Blood bank.

## 2024-01-18 NOTE — Telephone Encounter (Signed)
 Pt's platelets was 20 today. Dr Timmy would like pt to receive 1 unit of platelets. JoEllen, RN contacted pt with results and scheduled him for lab and 1 unit of platelets for Wednesday, 01/20/24.

## 2024-01-19 ENCOUNTER — Other Ambulatory Visit: Payer: Self-pay

## 2024-01-19 LAB — BPAM PLATELET PHERESIS
Blood Product Expiration Date: 202511282359
ISSUE DATE / TIME: 202511241211
Unit Type and Rh: 5100

## 2024-01-19 LAB — PREPARE PLATELET PHERESIS: Unit division: 0

## 2024-01-20 ENCOUNTER — Inpatient Hospital Stay: Admitting: Licensed Clinical Social Worker

## 2024-01-20 ENCOUNTER — Inpatient Hospital Stay

## 2024-01-20 ENCOUNTER — Telehealth: Payer: Self-pay

## 2024-01-20 ENCOUNTER — Other Ambulatory Visit: Payer: Self-pay

## 2024-01-20 DIAGNOSIS — Z952 Presence of prosthetic heart valve: Secondary | ICD-10-CM | POA: Diagnosis not present

## 2024-01-20 DIAGNOSIS — C9200 Acute myeloblastic leukemia, not having achieved remission: Secondary | ICD-10-CM | POA: Diagnosis not present

## 2024-01-20 DIAGNOSIS — C9201 Acute myeloblastic leukemia, in remission: Secondary | ICD-10-CM

## 2024-01-20 DIAGNOSIS — Z5111 Encounter for antineoplastic chemotherapy: Secondary | ICD-10-CM | POA: Diagnosis not present

## 2024-01-20 DIAGNOSIS — Z79899 Other long term (current) drug therapy: Secondary | ICD-10-CM | POA: Diagnosis not present

## 2024-01-20 DIAGNOSIS — Z7901 Long term (current) use of anticoagulants: Secondary | ICD-10-CM | POA: Diagnosis not present

## 2024-01-20 DIAGNOSIS — D46Z Other myelodysplastic syndromes: Secondary | ICD-10-CM

## 2024-01-20 LAB — CMP (CANCER CENTER ONLY)
ALT: 32 U/L (ref 0–44)
AST: 22 U/L (ref 15–41)
Albumin: 4.3 g/dL (ref 3.5–5.0)
Alkaline Phosphatase: 68 U/L (ref 38–126)
Anion gap: 10 (ref 5–15)
BUN: 17 mg/dL (ref 8–23)
CO2: 24 mmol/L (ref 22–32)
Calcium: 9.6 mg/dL (ref 8.9–10.3)
Chloride: 106 mmol/L (ref 98–111)
Creatinine: 1.05 mg/dL (ref 0.61–1.24)
GFR, Estimated: >60 mL/min (ref >60)
Glucose, Bld: 166 mg/dL — ABNORMAL HIGH (ref 70–99)
Potassium: 4.3 mmol/L (ref 3.5–5.1)
Sodium: 140 mmol/L (ref 135–145)
Total Bilirubin: 0.6 mg/dL (ref 0.0–1.2)
Total Protein: 6.1 g/dL — ABNORMAL LOW (ref 6.5–8.1)

## 2024-01-20 LAB — CBC WITH DIFFERENTIAL (CANCER CENTER ONLY)
Abs Immature Granulocytes: 0 K/uL (ref 0.00–0.07)
Basophils Absolute: 0 K/uL (ref 0.0–0.1)
Basophils Relative: 1 %
Eosinophils Absolute: 0 K/uL (ref 0.0–0.5)
Eosinophils Relative: 1 %
HCT: 32.2 % — ABNORMAL LOW (ref 39.0–52.0)
Hemoglobin: 11.2 g/dL — ABNORMAL LOW (ref 13.0–17.0)
Immature Granulocytes: 0 %
Lymphocytes Relative: 9 %
Lymphs Abs: 0.1 K/uL — ABNORMAL LOW (ref 0.7–4.0)
MCH: 34.6 pg — ABNORMAL HIGH (ref 26.0–34.0)
MCHC: 34.8 g/dL (ref 30.0–36.0)
MCV: 99.4 fL (ref 80.0–100.0)
Monocytes Absolute: 0.1 K/uL (ref 0.1–1.0)
Monocytes Relative: 6 %
Neutro Abs: 1.2 K/uL — ABNORMAL LOW (ref 1.7–7.7)
Neutrophils Relative %: 83 %
Platelet Count: 28 K/uL — ABNORMAL LOW (ref 150–400)
RBC: 3.24 MIL/uL — ABNORMAL LOW (ref 4.22–5.81)
RDW: 16.2 % — ABNORMAL HIGH (ref 11.5–15.5)
Smear Review: NORMAL
WBC Count: 1.4 K/uL — ABNORMAL LOW (ref 4.0–10.5)
nRBC: 0 % (ref 0.0–0.2)

## 2024-01-20 MED ORDER — SODIUM CHLORIDE 0.9% IV SOLUTION
250.0000 mL | INTRAVENOUS | Status: DC
  Administered 2024-01-20: 250 mL via INTRAVENOUS

## 2024-01-20 NOTE — Patient Instructions (Signed)
 Low Number of Platelets (Thrombocytopenia): What to Know  Thrombocytopenia means that you have a low number of platelets. Platelets are cells in your blood. When you bleed, they clump together as a clot to stop the bleeding. If you don't have enough platelets, your blood may have trouble clotting. This may cause you to bleed inside your body. What are the causes? Having a low number of platelets in your blood may happen if: Your body doesn't make enough platelets. This may be caused by: Anemia. Leukemia. Other bone marrow diseases. Your platelets don't release into your blood. This can happen if: You have a spleen that's too large. You have Gaucher disease. Your body breaks down platelets too fast. This may be caused by: An autoimmune disease. This is a problem that affects your body's defense (immune) system. Certain medicines that thin your blood. Being exposed to harmful (toxic) chemicals. Pregnancy. You have a disease that's inherited. This means it's passed from parent to child. You've had certain cancer medicines or treatments. You have an infection from germs, such as bacteria or viruses. You drink too much alcohol. What are the signs or symptoms? Bruising easy. Bleeding from your nose or mouth. Heavy periods in females. Blood in your pee, poop, or throw-up. A purple-red color to your skin. Having very small red spots on your skin that look like a rash. How is this diagnosed? You may be diagnosed with a physical exam. You may also have tests, such as: Blood tests. A biopsy. This is when a small piece of bone marrow is removed for testing. An ultrasound or CT scan of your belly. How is this treated? Treatment may include: Treating the problem that's causing the low number of platelets. Taking medicines to stop your body from breaking down platelets too fast. Being given platelets through a transfusion. Having surgery to remove your spleen. Follow these instructions at  home: Medicines Take your medicines only as told. Do not take aspirin or ibuprofen unless you're told to. These medicines can make you more likely to bleed. Activity Do not do things that could hurt or bruise you. Be careful to avoid falls. Do not play contact sports. Ask what things are safe for you to do at home. Take care not to cut yourself: When you shave. When you use scissors or other sharp tools. General instructions  Check your skin and the inside of your mouth for bruises or blood. Check your pee and poop for blood. Do not drink alcohol if your health care provider tells you not to drink. Stay away from harmful chemicals. Tell all of your providers that you have a low number of platelets. Tell your dental care provider before you have any dental work or cleanings. Wear an alert bracelet or carry a card that says you may bleed easy. Contact a health care provider if: You have bruises that you can't explain. You have any bleeding. You have blood in your: Throw-up. Pee. Poop. You have a fever. Get help right away if: You have bleeding that you can't stop. You hit your head. You have a sudden, very bad headache. This information is not intended to replace advice given to you by your health care provider. Make sure you discuss any questions you have with your health care provider. Document Revised: 04/07/2023 Document Reviewed: 04/07/2023 Elsevier Patient Education  2025 [employer].

## 2024-01-20 NOTE — Telephone Encounter (Signed)
 Pt's platelet results was 28. Parameters from [facility] say 25 or less. Per Dr Timmy give 1 unit today since we will be out of the office until Monday, 01/25/24. Orders placed and pt advised.

## 2024-01-20 NOTE — Progress Notes (Signed)
 [facility] Clinical Social Work  Clinical Social Work made a follow up visit with patient in infusion.  Clinical Social Worker offered support and assess for needs.     Interventions: Provided patient with information about CSW role. Patient denied any needs at this time.      Follow Up Plan:  Patient will contact CSW with any support or resource needs    Macario CHRISTELLA Au, LCSW  Clinical Social Worker [facility]

## 2024-01-22 ENCOUNTER — Other Ambulatory Visit (HOSPITAL_COMMUNITY): Payer: Self-pay

## 2024-01-22 ENCOUNTER — Other Ambulatory Visit: Payer: Self-pay

## 2024-01-22 LAB — BPAM PLATELET PHERESIS
Blood Product Expiration Date: 202511292359
ISSUE DATE / TIME: 202511261039
Unit Type and Rh: 6200

## 2024-01-22 LAB — PREPARE PLATELET PHERESIS: Unit division: 0

## 2024-01-22 NOTE — Progress Notes (Signed)
 Specialty Pharmacy Refill Coordination Note  Patrick [name] is a 83 y.o. male contacted today regarding refills of specialty medication(s) Venetoclax  (VENCLEXTA )   Patient requested Delivery   Delivery date: 01/26/24   Verified address: [address]  [place] [address]   Medication will be filled on: 01/25/24

## 2024-01-25 ENCOUNTER — Telehealth: Payer: Self-pay | Admitting: *Deleted

## 2024-01-25 ENCOUNTER — Ambulatory Visit: Payer: Self-pay

## 2024-01-25 ENCOUNTER — Inpatient Hospital Stay: Attending: Hematology & Oncology

## 2024-01-25 ENCOUNTER — Inpatient Hospital Stay

## 2024-01-25 ENCOUNTER — Other Ambulatory Visit: Payer: Self-pay

## 2024-01-25 DIAGNOSIS — Z79631 Long term (current) use of antimetabolite agent: Secondary | ICD-10-CM | POA: Diagnosis not present

## 2024-01-25 DIAGNOSIS — C9201 Acute myeloblastic leukemia, in remission: Secondary | ICD-10-CM

## 2024-01-25 DIAGNOSIS — D46Z Other myelodysplastic syndromes: Secondary | ICD-10-CM

## 2024-01-25 DIAGNOSIS — D696 Thrombocytopenia, unspecified: Secondary | ICD-10-CM | POA: Diagnosis not present

## 2024-01-25 DIAGNOSIS — Z7901 Long term (current) use of anticoagulants: Secondary | ICD-10-CM | POA: Diagnosis not present

## 2024-01-25 DIAGNOSIS — Z5111 Encounter for antineoplastic chemotherapy: Secondary | ICD-10-CM | POA: Diagnosis present

## 2024-01-25 DIAGNOSIS — D72810 Lymphocytopenia: Secondary | ICD-10-CM

## 2024-01-25 DIAGNOSIS — D709 Neutropenia, unspecified: Secondary | ICD-10-CM

## 2024-01-25 DIAGNOSIS — D708 Other neutropenia: Secondary | ICD-10-CM

## 2024-01-25 DIAGNOSIS — Z952 Presence of prosthetic heart valve: Secondary | ICD-10-CM | POA: Insufficient documentation

## 2024-01-25 DIAGNOSIS — D4621 Refractory anemia with excess of blasts 1: Secondary | ICD-10-CM

## 2024-01-25 DIAGNOSIS — C9200 Acute myeloblastic leukemia, not having achieved remission: Secondary | ICD-10-CM | POA: Insufficient documentation

## 2024-01-25 LAB — CBC WITH DIFFERENTIAL (CANCER CENTER ONLY)
Basophils Absolute: 0 K/uL (ref 0.0–0.1)
Basophils Relative: 0 %
Eosinophils Absolute: 0 K/uL (ref 0.0–0.5)
Eosinophils Relative: 6 %
HCT: 32.4 % — ABNORMAL LOW (ref 39.0–52.0)
Hemoglobin: 11.2 g/dL — ABNORMAL LOW (ref 13.0–17.0)
Lymphocytes Relative: 10 %
Lymphs Abs: 0.1 K/uL — ABNORMAL LOW (ref 0.7–4.0)
MCH: 34.8 pg — ABNORMAL HIGH (ref 26.0–34.0)
MCHC: 34.6 g/dL (ref 30.0–36.0)
MCV: 100.6 fL — ABNORMAL HIGH (ref 80.0–100.0)
Monocytes Absolute: 0 K/uL — ABNORMAL LOW (ref 0.1–1.0)
Monocytes Relative: 2 %
Neutro Abs: 0.4 K/uL — CL (ref 1.7–7.7)
Neutrophils Relative %: 82 %
Platelet Count: 24 K/uL — ABNORMAL LOW (ref 150–400)
RBC: 3.22 MIL/uL — ABNORMAL LOW (ref 4.22–5.81)
RDW: 16.6 % — ABNORMAL HIGH (ref 11.5–15.5)
WBC Count: 0.5 K/uL — CL (ref 4.0–10.5)
nRBC: 0 % (ref 0.0–0.2)

## 2024-01-25 LAB — PROTIME-INR
INR: 1 (ref 0.8–1.2)
Prothrombin Time: 14 s (ref 11.4–15.2)

## 2024-01-25 LAB — CMP (CANCER CENTER ONLY)
ALT: 24 U/L (ref 0–44)
AST: 20 U/L (ref 15–41)
Albumin: 4.3 g/dL (ref 3.5–5.0)
Alkaline Phosphatase: 72 U/L (ref 38–126)
Anion gap: 7 (ref 5–15)
BUN: 16 mg/dL (ref 8–23)
CO2: 29 mmol/L (ref 22–32)
Calcium: 9.8 mg/dL (ref 8.9–10.3)
Chloride: 106 mmol/L (ref 98–111)
Creatinine: 1.11 mg/dL (ref 0.61–1.24)
GFR, Estimated: >60 mL/min (ref >60)
Glucose, Bld: 117 mg/dL — ABNORMAL HIGH (ref 70–99)
Potassium: 4.7 mmol/L (ref 3.5–5.1)
Sodium: 141 mmol/L (ref 135–145)
Total Bilirubin: 0.6 mg/dL (ref 0.0–1.2)
Total Protein: 6.1 g/dL — ABNORMAL LOW (ref 6.5–8.1)

## 2024-01-25 LAB — D-DIMER, QUANTITATIVE: D-Dimer, Quant: 1.46 ug{FEU}/mL — ABNORMAL HIGH (ref 0.00–0.50)

## 2024-01-25 LAB — LACTATE DEHYDROGENASE: LDH: 241 U/L — ABNORMAL HIGH (ref 105–235)

## 2024-01-25 LAB — APTT: aPTT: 34 s (ref 24–36)

## 2024-01-25 LAB — URIC ACID: Uric Acid, Serum: 3.7 mg/dL (ref 3.7–8.6)

## 2024-01-25 LAB — FIBRINOGEN: Fibrinogen: 298 mg/dL (ref 210–475)

## 2024-01-25 LAB — PHOSPHORUS: Phosphorus: 3.2 mg/dL (ref 2.5–4.6)

## 2024-01-25 LAB — SAMPLE TO BLOOD BANK

## 2024-01-25 LAB — MAGNESIUM: Magnesium: 2.1 mg/dL (ref 1.7–2.4)

## 2024-01-25 NOTE — Telephone Encounter (Signed)
 Called and left a message for him that his platelet count is 24 and he does not need any platelets today per Dr. Timmy. Please call the office if you have any concerns or questions.

## 2024-01-25 NOTE — Telephone Encounter (Signed)
 Sherry from [facility] lab called with a critical WBC of 0.5 and ANC 0.4. Platelets are 24 and HGB 11.2. Results given to MD.

## 2024-01-25 NOTE — Telephone Encounter (Signed)
 FYI Only or Action Required?: Action required by provider: clinical question for provider, update on patient condition, and medication request.  Patient was last seen in primary care on 03/26/2023 by Moishe Chiquita HERO, NP.  Called Nurse Triage reporting Flank Pain.  Symptoms began several weeks ago.  Interventions attempted: Rest, hydration, or home remedies.  Symptoms are: gradually worsening.  Triage Disposition: See Physician Within 24 Hours  Patient/caregiver understands and will follow disposition?: No, wishes to speak with PCP    Copied from CRM #[id number]. 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 On Anticoagulants.   Refused offered appointment. Would like Dr. Garald to prescribe medication to chemically help break up gall stones, I couldn't even consider surgery now due to my platelets  Requesting call back or MyChart message with pcp recommendation.     1. LOCATION: Where does it hurt? (e.g., left, right)     Right side under rib 2. ONSET: When did the pain start?     Two weeks  3. SEVERITY: How bad is the pain? (e.g., Scale 1-10; mild, moderate, or severe)     5/10 sharp pinching 4. PATTERN: Does the pain come and go, or is it constant?      Intermittent last short time, becoming more frequent attacks 5. CAUSE: What do you think is causing the pain?     Unsure-gallstones 6. OTHER SYMPTOMS:  Do you have any other symptoms? (e.g., fever, abdomen pain, vomiting, leg weakness, burning with urination, blood in urine)     denies 7. PREGNANCY:  Is there any chance you are pregnant? When was your last menstrual  period?  Protocols used: Flank Pain-A-AH

## 2024-01-25 NOTE — Telephone Encounter (Signed)
 Called patient again to remind him to not take Lovenox  injections per Dr. Timmy. If you have any questions or concerns please call the office.

## 2024-01-26 NOTE — Telephone Encounter (Signed)
 Please use dry heat, topical cream, Tylenol  as needed.  Office visit with any provider if not better.  Thank you

## 2024-01-29 ENCOUNTER — Inpatient Hospital Stay

## 2024-01-29 ENCOUNTER — Other Ambulatory Visit: Payer: Self-pay

## 2024-01-29 ENCOUNTER — Other Ambulatory Visit (HOSPITAL_COMMUNITY): Payer: Self-pay

## 2024-01-29 ENCOUNTER — Encounter: Payer: Self-pay | Admitting: Hematology & Oncology

## 2024-01-29 ENCOUNTER — Other Ambulatory Visit: Payer: Self-pay | Admitting: *Deleted

## 2024-01-29 ENCOUNTER — Telehealth: Payer: Self-pay | Admitting: *Deleted

## 2024-01-29 ENCOUNTER — Other Ambulatory Visit (HOSPITAL_BASED_OUTPATIENT_CLINIC_OR_DEPARTMENT_OTHER): Payer: Self-pay

## 2024-01-29 VITALS — BP 161/63 | HR 58 | Temp 97.5°F

## 2024-01-29 DIAGNOSIS — D46Z Other myelodysplastic syndromes: Secondary | ICD-10-CM

## 2024-01-29 DIAGNOSIS — D72810 Lymphocytopenia: Secondary | ICD-10-CM

## 2024-01-29 DIAGNOSIS — R8290 Unspecified abnormal findings in urine: Secondary | ICD-10-CM

## 2024-01-29 DIAGNOSIS — C9201 Acute myeloblastic leukemia, in remission: Secondary | ICD-10-CM

## 2024-01-29 DIAGNOSIS — D4621 Refractory anemia with excess of blasts 1: Secondary | ICD-10-CM

## 2024-01-29 DIAGNOSIS — D708 Other neutropenia: Secondary | ICD-10-CM

## 2024-01-29 DIAGNOSIS — D696 Thrombocytopenia, unspecified: Secondary | ICD-10-CM

## 2024-01-29 DIAGNOSIS — R309 Painful micturition, unspecified: Secondary | ICD-10-CM

## 2024-01-29 DIAGNOSIS — Z5111 Encounter for antineoplastic chemotherapy: Secondary | ICD-10-CM | POA: Diagnosis not present

## 2024-01-29 DIAGNOSIS — D709 Neutropenia, unspecified: Secondary | ICD-10-CM

## 2024-01-29 LAB — CBC WITH DIFFERENTIAL (CANCER CENTER ONLY)
Abs Immature Granulocytes: 0 K/uL (ref 0.00–0.07)
Basophils Absolute: 0 K/uL (ref 0.0–0.1)
Basophils Relative: 3 %
Eosinophils Absolute: 0 K/uL (ref 0.0–0.5)
Eosinophils Relative: 3 %
HCT: 31.6 % — ABNORMAL LOW (ref 39.0–52.0)
Hemoglobin: 11 g/dL — ABNORMAL LOW (ref 13.0–17.0)
Immature Granulocytes: 0 %
Lymphocytes Relative: 49 %
Lymphs Abs: 0.1 K/uL — ABNORMAL LOW (ref 0.7–4.0)
MCH: 34.8 pg — ABNORMAL HIGH (ref 26.0–34.0)
MCHC: 34.8 g/dL (ref 30.0–36.0)
MCV: 100 fL (ref 80.0–100.0)
Monocytes Absolute: 0 K/uL — ABNORMAL LOW (ref 0.1–1.0)
Monocytes Relative: 3 %
Neutro Abs: 0.1 K/uL — CL (ref 1.7–7.7)
Neutrophils Relative %: 42 %
Platelet Count: 40 K/uL — ABNORMAL LOW (ref 150–400)
RBC: 3.16 MIL/uL — ABNORMAL LOW (ref 4.22–5.81)
RDW: 16.7 % — ABNORMAL HIGH (ref 11.5–15.5)
Smear Review: NORMAL
WBC Count: 0.3 K/uL — CL (ref 4.0–10.5)
nRBC: 0 % (ref 0.0–0.2)

## 2024-01-29 LAB — BPAM PLATELET PHERESIS
Blood Product Expiration Date: 202512062359
ISSUE DATE / TIME: 202512051125
Unit Type and Rh: 6200

## 2024-01-29 LAB — PREPARE PLATELET PHERESIS: Unit division: 0

## 2024-01-29 MED ORDER — NORMAL SALINE FLUSH 0.9 % IV SOLN
10.0000 mL | Freq: Every day | INTRAVENOUS | 1 refills | Status: AC
  Filled 2024-01-29: qty 600, 60d supply, fill #0

## 2024-01-29 NOTE — Addendum Note (Signed)
 Addended by: JEWEL SHANDA SAUNDERS on: 01/29/2024 01:11 PM   Modules accepted: Orders

## 2024-01-29 NOTE — Telephone Encounter (Signed)
 Called and left detailed voice message for patient informing him of PCP recommendations

## 2024-01-29 NOTE — Telephone Encounter (Signed)
 Received call from [facility] ER lab with a panic WBC 0.3 and critical ANC 0.1. Platelets 40 and HGB 11.0. Results given to MD.

## 2024-01-29 NOTE — Progress Notes (Signed)
 Pt states that he has had no further nose bleeds since his nose bleed during the middle of the night and states that he did double up on his Lovenox  yesterday accidentally.  Dr. Timmy notified and order received for pt to hold Lovenox  today and to resume tomorrow at half dose. Teach back done.  Pt is appreciative of assistance and has no further questions at this time.

## 2024-02-01 ENCOUNTER — Telehealth: Payer: Self-pay | Admitting: *Deleted

## 2024-02-01 ENCOUNTER — Inpatient Hospital Stay: Admitting: Hematology & Oncology

## 2024-02-01 ENCOUNTER — Encounter: Payer: Self-pay | Admitting: Hematology & Oncology

## 2024-02-01 ENCOUNTER — Other Ambulatory Visit: Payer: Self-pay

## 2024-02-01 ENCOUNTER — Inpatient Hospital Stay

## 2024-02-01 ENCOUNTER — Other Ambulatory Visit (HOSPITAL_BASED_OUTPATIENT_CLINIC_OR_DEPARTMENT_OTHER): Payer: Self-pay

## 2024-02-01 VITALS — BP 124/80 | HR 63 | Temp 97.3°F | Resp 18 | Ht 71.0 in | Wt 162.0 lb

## 2024-02-01 DIAGNOSIS — C9201 Acute myeloblastic leukemia, in remission: Secondary | ICD-10-CM | POA: Diagnosis not present

## 2024-02-01 DIAGNOSIS — D708 Other neutropenia: Secondary | ICD-10-CM

## 2024-02-01 DIAGNOSIS — D46Z Other myelodysplastic syndromes: Secondary | ICD-10-CM

## 2024-02-01 DIAGNOSIS — Z5111 Encounter for antineoplastic chemotherapy: Secondary | ICD-10-CM | POA: Diagnosis not present

## 2024-02-01 DIAGNOSIS — D696 Thrombocytopenia, unspecified: Secondary | ICD-10-CM

## 2024-02-01 DIAGNOSIS — D709 Neutropenia, unspecified: Secondary | ICD-10-CM

## 2024-02-01 DIAGNOSIS — D72810 Lymphocytopenia: Secondary | ICD-10-CM

## 2024-02-01 DIAGNOSIS — D4621 Refractory anemia with excess of blasts 1: Secondary | ICD-10-CM

## 2024-02-01 LAB — CMP (CANCER CENTER ONLY)
ALT: 31 U/L (ref 0–44)
AST: 24 U/L (ref 15–41)
Albumin: 4.5 g/dL (ref 3.5–5.0)
Alkaline Phosphatase: 76 U/L (ref 38–126)
Anion gap: 10 (ref 5–15)
BUN: 15 mg/dL (ref 8–23)
CO2: 27 mmol/L (ref 22–32)
Calcium: 10.1 mg/dL (ref 8.9–10.3)
Chloride: 106 mmol/L (ref 98–111)
Creatinine: 1.18 mg/dL (ref 0.61–1.24)
GFR, Estimated: >60 mL/min (ref >60)
Glucose, Bld: 155 mg/dL — ABNORMAL HIGH (ref 70–99)
Potassium: 4.5 mmol/L (ref 3.5–5.1)
Sodium: 142 mmol/L (ref 135–145)
Total Bilirubin: 0.6 mg/dL (ref 0.0–1.2)
Total Protein: 6.5 g/dL (ref 6.5–8.1)

## 2024-02-01 LAB — CBC WITH DIFFERENTIAL (CANCER CENTER ONLY)
Abs Immature Granulocytes: 0 K/uL (ref 0.00–0.07)
Basophils Absolute: 0 K/uL (ref 0.0–0.1)
Basophils Relative: 3 %
Eosinophils Absolute: 0 K/uL (ref 0.0–0.5)
Eosinophils Relative: 3 %
HCT: 34.5 % — ABNORMAL LOW (ref 39.0–52.0)
Hemoglobin: 12 g/dL — ABNORMAL LOW (ref 13.0–17.0)
Immature Granulocytes: 0 %
Lymphocytes Relative: 36 %
Lymphs Abs: 0.1 K/uL — ABNORMAL LOW (ref 0.7–4.0)
MCH: 34.8 pg — ABNORMAL HIGH (ref 26.0–34.0)
MCHC: 34.8 g/dL (ref 30.0–36.0)
MCV: 100 fL (ref 80.0–100.0)
Monocytes Absolute: 0 K/uL — ABNORMAL LOW (ref 0.1–1.0)
Monocytes Relative: 10 %
Neutro Abs: 0.2 K/uL — CL (ref 1.7–7.7)
Neutrophils Relative %: 48 %
Platelet Count: 82 K/uL — ABNORMAL LOW (ref 150–400)
RBC: 3.45 MIL/uL — ABNORMAL LOW (ref 4.22–5.81)
RDW: 16.8 % — ABNORMAL HIGH (ref 11.5–15.5)
WBC Count: 0.3 K/uL — CL (ref 4.0–10.5)
nRBC: 0 % (ref 0.0–0.2)

## 2024-02-01 LAB — PROTIME-INR
INR: 1 (ref 0.8–1.2)
Prothrombin Time: 13.9 s (ref 11.4–15.2)

## 2024-02-01 LAB — D-DIMER, QUANTITATIVE: D-Dimer, Quant: 1.08 ug{FEU}/mL — ABNORMAL HIGH (ref 0.00–0.50)

## 2024-02-01 LAB — SAMPLE TO BLOOD BANK

## 2024-02-01 LAB — MAGNESIUM: Magnesium: 2.1 mg/dL (ref 1.7–2.4)

## 2024-02-01 LAB — APTT: aPTT: 33 s (ref 24–36)

## 2024-02-01 LAB — LACTATE DEHYDROGENASE: LDH: 249 U/L — ABNORMAL HIGH (ref 105–235)

## 2024-02-01 LAB — FIBRINOGEN: Fibrinogen: 415 mg/dL (ref 210–475)

## 2024-02-01 LAB — URIC ACID: Uric Acid, Serum: 3.6 mg/dL — ABNORMAL LOW (ref 3.7–8.6)

## 2024-02-01 LAB — PHOSPHORUS: Phosphorus: 2.7 mg/dL (ref 2.5–4.6)

## 2024-02-01 NOTE — Progress Notes (Signed)
 No tmt today. WBC/ANC [place]. Education completed. PICC flushes and dressing change only today.

## 2024-02-01 NOTE — Progress Notes (Signed)
 9 Hematology and Oncology Follow Up Visit  Patrick [name] [id number] [birth year] 83 y.o. 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  Patrick [name] is doing okay.  Had little bit of epistaxis last week.  Patrick [name] may have taken a bit too much Lovenox  and with his platelet count being low.  Patrick [name] feels all right.  However, his white cell count is quite low.  I think  Have to push his treatment back in 1 week.  I do not see a problem doing this.  Patrick [name] did have a nice Thanksgiving.  Patrick [name] ate fairly well.  Patrick [name] has had no problems with nausea or vomiting.  Patrick [name] has had no problems with cough or shortness of breath.  Patrick [name] has had no leg swelling.  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  Patrick [name] has had no issues with headache.  There is been no mouth sores.  Overall, I would say that his performance status is probably ECOG 1.     Medications:  Current Outpatient Medications:    amLODipine  (NORVASC ) 2.5 MG tablet, Take 3 tablets (7.5 mg total) by mouth daily. Do not take if SBP < 110, Disp: 270 tablet, Rfl: 2   atorvastatin  (LIPITOR) 10 MG tablet, TAKE ONE TABLET BY MOUTH ONE TIME DAILY, Disp: 90 tablet, Rfl: 2   diphenoxylate -atropine  (LOMOTIL ) 2.5-0.025 MG tablet, Take 1 tablet by mouth 4 (four) times daily as needed for diarrhea or loose stools., Disp: 60 tablet, Rfl: 1   enoxaparin  (LOVENOX ) 120 MG/0.8ML injection, Inject 0.8 mLs (120 mg total) into the skin daily., Disp: 24 mL, Rfl: 12   famciclovir  (FAMVIR ) 250 MG tablet, Take 250 mg by mouth daily., Disp: , Rfl:    hydrocortisone 2.5 % cream, Apply 1 Application  topically daily as needed., Disp: , Rfl:    levofloxacin  (LEVAQUIN ) 500 MG tablet, Take 1 tablet (500 mg total) by mouth daily., Disp: 30 tablet, Rfl: 2   ondansetron  (ZOFRAN ) 8 MG tablet, Take 1 tablet (8 mg total) by mouth every 8 (eight) hours as needed., Disp: 20 tablet, Rfl: 2   pantoprazole  (PROTONIX ) 40 MG tablet, Take 40 mg by mouth daily., Disp: , Rfl:    posaconazole  (NOXAFIL ) 100 MG TBEC delayed-release tablet, Take 3 tablets (300 mg total) by mouth daily., Disp: , Rfl:    prochlorperazine  (COMPAZINE ) 10 MG tablet, Take 1 tablet (10 mg total) by mouth every 6 (six) hours as needed for nausea or vomiting., Disp: 30 tablet, Rfl: 2   rOPINIRole  (REQUIP ) 0.25 MG tablet, TAKE ONE TABLET BY MOUTH 1 HOUR BEFORE BEDTIME FOR 3 DAYS, THEN TAKE TWO TABLETS BY MOUTH 1 HOUR BEFORE BEDTIME FOR 7 DAYS, THEN TAKE FOUR TABLETS BY MOUTH 1 HOUR BEFORE BEDTIME, Disp: 60 tablet, Rfl: 2   Sodium Chloride  Flush (NORMAL SALINE FLUSH) 0.9 % SOLN, Inject 10 mLs into the PICC line daily., Disp: 600 mL, Rfl: 1   SUMAtriptan  (IMITREX ) 25 MG tablet, TAKE ONE TABLET BY MOUTH AT ONSET OF HEADACHE. MAY REPEAT DOSE WITH ONE TABLET IN TWO HOURS IF NEEDED. DO NOT EXCEED  TWO TABLETS IN 24 HOURS., Disp: 12 tablet, Rfl: 0   tadalafil  (CIALIS ) 5 MG tablet, Take 1 tablet (5 mg total) by mouth daily. (Patient not taking: Reported on 01/04/2024), Disp: 90 tablet, Rfl: 3   venetoclax  (VENCLEXTA ) 100 MG tablet, Take 1 tablet (100 mg total) by mouth daily. Take one tablet by mouth for 7 days.  Tablets should be swallowed whole with a meal and a full glass of water., Disp: 7 tablet, Rfl: 0 No current facility-administered medications for this visit.  Facility-Administered Medications Ordered in Other Visits:    phosphorus (K PHOS  NEUTRAL) tablet 1,000 mg, 1,000 mg, Oral, Once, Kerstyn Coryell, Maude SAUNDERS, MD   prochlorperazine  (COMPAZINE ) tablet 10 mg, 10 mg, Oral, Once, Kashish Yglesias, Maude SAUNDERS, MD  Allergies:  Allergies  Allergen Reactions    Codeine  Rash, Dermatitis and Other (See Comments)    ...   Terbinafine Itching    Past Medical History, Surgical history, Social history, and Family History were reviewed and updated.  Review of Systems: Review of Systems  Constitutional: Negative.   HENT:  Negative.    Eyes: Negative.   Respiratory: Negative.    Cardiovascular: Negative.   Gastrointestinal: Negative.   Endocrine: Negative.   Genitourinary: Negative.    Musculoskeletal: Negative.   Skin: Negative.   Neurological: Negative.   Hematological: Negative.   Psychiatric/Behavioral: Negative.      Physical Exam:  Vital signs show temperature of 97.3.  Pulse 63.  Blood pressure 124/80.  Weight is 162 pounds.    Wt Readings from Last 3 Encounters:  02/01/24 162 lb (73.5 kg)  01/04/24 160 lb 12.8 oz (72.9 kg)  12/07/23 162 lb (73.5 kg)    Physical Exam Vitals reviewed.  HENT:     Head: Normocephalic and atraumatic.  Eyes:     Pupils: Pupils are equal, round, and reactive to light.     Comments: Ocular exam does show the conjunctival hemorrhage with the right eye.  Pupil reacts appropriately.  Patrick [name] has good extraocular muscle movement.  Cardiovascular:     Rate and Rhythm: Normal rate and regular rhythm.     Heart sounds: Normal heart sounds.     Comments: Cardiac exam shows a regular rate and rhythm.  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  I hear no murmurs or rubs or bruits. Pulmonary:     Effort: Pulmonary effort is normal.     Breath sounds: Normal breath sounds.  Abdominal:     General: Bowel sounds are normal.     Palpations: Abdomen is soft.  Musculoskeletal:        General: No tenderness or deformity. Normal range of motion.     Cervical back: Normal range of motion.  Lymphadenopathy:     Cervical: No cervical adenopathy.  Skin:    General: Skin is warm and dry.     Findings: No erythema or rash.  Neurological:     Mental Status: Patrick [name] is alert and oriented to person,  place, and time.  Psychiatric:        Behavior: Behavior normal.        Thought Content: Thought content normal.        Judgment: Judgment normal.      Lab Results  Component Value Date   WBC 0.3 (LL) 02/01/2024   HGB 12.0 (L) 02/01/2024   HCT 34.5 (L) 02/01/2024   MCV 100.0 02/01/2024   PLT 82 (L) 02/01/2024     Chemistry  Component Value Date/Time   NA 142 02/01/2024 0906   NA 139 11/12/2020 1030   K 4.5 02/01/2024 0906   CL 106 02/01/2024 0906   CO2 27 02/01/2024 0906   BUN 15 02/01/2024 0906   BUN 16 11/12/2020 1030   CREATININE 1.18 02/01/2024 0906      Component Value Date/Time   CALCIUM  10.1 02/01/2024 0906   ALKPHOS 76 02/01/2024 0906   AST 24 02/01/2024 0906   ALT 31 02/01/2024 0906   BILITOT 0.6 02/01/2024 0906       Impression and Plan: Patrick [name] is a 83 year old white male.  Patrick [name] has had mild leukopenia and thrombocytopenia. I did do a bone marrow biopsy on him.  This confirmed that Patrick [name] has myelodysplasia.  Again, I would classify him as refractory anemia with excess blast-1  (RAEB-1).  We went ahead and treated him with azacitidine .  Patrick [name] there was found to have progressive disease.  Patrick [name] subsequently went down to [facility].  Patrick [name] was placed on protocol therapy there.  Patrick [name] did incredibly well.  Patrick [name] got into remission.  Patrick [name] now is having maintenance treatment.  In [place] .  Patrick [name] has done well so far.  Patrick [name] had the bleeding last week.  However, I do not think that is going to be a problem for him.  I would hold off on his second cycle of decitabine .  I think we needed to give him a week.  I will check his labs this Thursday.  I will go ahead and plan to get him back next week.  I think Patrick [name] goes back down to [facility] at the beginning of January.   Maude JONELLE Crease, MD 12/8/20253:55 PM

## 2024-02-01 NOTE — Telephone Encounter (Signed)
 Critical WBC .3, ANC .2.  Dr Timmy aware.  Seeing patient in exam room

## 2024-02-02 ENCOUNTER — Inpatient Hospital Stay

## 2024-02-03 ENCOUNTER — Inpatient Hospital Stay

## 2024-02-03 ENCOUNTER — Encounter: Payer: Self-pay | Admitting: Hematology & Oncology

## 2024-02-04 ENCOUNTER — Encounter: Payer: Self-pay | Admitting: Hematology & Oncology

## 2024-02-04 ENCOUNTER — Inpatient Hospital Stay

## 2024-02-04 ENCOUNTER — Other Ambulatory Visit: Payer: Self-pay | Admitting: *Deleted

## 2024-02-04 DIAGNOSIS — C9201 Acute myeloblastic leukemia, in remission: Secondary | ICD-10-CM

## 2024-02-04 DIAGNOSIS — Z5111 Encounter for antineoplastic chemotherapy: Secondary | ICD-10-CM | POA: Diagnosis not present

## 2024-02-04 LAB — CMP (CANCER CENTER ONLY)
ALT: 24 U/L (ref 0–44)
AST: 21 U/L (ref 15–41)
Albumin: 4.2 g/dL (ref 3.5–5.0)
Alkaline Phosphatase: 74 U/L (ref 38–126)
Anion gap: 9 (ref 5–15)
BUN: 15 mg/dL (ref 8–23)
CO2: 26 mmol/L (ref 22–32)
Calcium: 9.4 mg/dL (ref 8.9–10.3)
Chloride: 105 mmol/L (ref 98–111)
Creatinine: 1.01 mg/dL (ref 0.61–1.24)
GFR, Estimated: >60 mL/min (ref >60)
Glucose, Bld: 163 mg/dL — ABNORMAL HIGH (ref 70–99)
Potassium: 3.8 mmol/L (ref 3.5–5.1)
Sodium: 139 mmol/L (ref 135–145)
Total Bilirubin: 0.5 mg/dL (ref 0.0–1.2)
Total Protein: 5.8 g/dL — ABNORMAL LOW (ref 6.5–8.1)

## 2024-02-04 LAB — CBC WITH DIFFERENTIAL (CANCER CENTER ONLY)
Abs Immature Granulocytes: 0.01 K/uL (ref 0.00–0.07)
Basophils Absolute: 0 K/uL (ref 0.0–0.1)
Basophils Relative: 2 %
Eosinophils Absolute: 0 K/uL (ref 0.0–0.5)
Eosinophils Relative: 4 %
HCT: 30.8 % — ABNORMAL LOW (ref 39.0–52.0)
Hemoglobin: 10.7 g/dL — ABNORMAL LOW (ref 13.0–17.0)
Immature Granulocytes: 2 %
Lymphocytes Relative: 24 %
Lymphs Abs: 0.1 K/uL — ABNORMAL LOW (ref 0.7–4.0)
MCH: 34.7 pg — ABNORMAL HIGH (ref 26.0–34.0)
MCHC: 34.7 g/dL (ref 30.0–36.0)
MCV: 100 fL (ref 80.0–100.0)
Monocytes Absolute: 0.1 K/uL (ref 0.1–1.0)
Monocytes Relative: 22 %
Neutro Abs: 0.3 K/uL — CL (ref 1.7–7.7)
Neutrophils Relative %: 46 %
Platelet Count: 103 K/uL — ABNORMAL LOW (ref 150–400)
RBC: 3.08 MIL/uL — ABNORMAL LOW (ref 4.22–5.81)
RDW: 16.1 % — ABNORMAL HIGH (ref 11.5–15.5)
WBC Count: 0.5 K/uL — CL (ref 4.0–10.5)
nRBC: 0 % (ref 0.0–0.2)

## 2024-02-04 NOTE — Telephone Encounter (Signed)
 Dr. Timmy notified of WBC-0.5.  No new orders received at this time.

## 2024-02-05 ENCOUNTER — Inpatient Hospital Stay

## 2024-02-08 ENCOUNTER — Encounter: Payer: Self-pay | Admitting: Hematology & Oncology

## 2024-02-08 ENCOUNTER — Inpatient Hospital Stay

## 2024-02-08 ENCOUNTER — Encounter: Payer: Self-pay | Admitting: *Deleted

## 2024-02-08 ENCOUNTER — Inpatient Hospital Stay: Admitting: Hematology & Oncology

## 2024-02-08 ENCOUNTER — Other Ambulatory Visit: Payer: Self-pay

## 2024-02-08 VITALS — BP 114/73 | HR 63 | Resp 18

## 2024-02-08 DIAGNOSIS — Z5111 Encounter for antineoplastic chemotherapy: Secondary | ICD-10-CM | POA: Diagnosis not present

## 2024-02-08 DIAGNOSIS — C9201 Acute myeloblastic leukemia, in remission: Secondary | ICD-10-CM

## 2024-02-08 DIAGNOSIS — D72810 Lymphocytopenia: Secondary | ICD-10-CM

## 2024-02-08 DIAGNOSIS — D696 Thrombocytopenia, unspecified: Secondary | ICD-10-CM

## 2024-02-08 DIAGNOSIS — D46Z Other myelodysplastic syndromes: Secondary | ICD-10-CM

## 2024-02-08 DIAGNOSIS — D4621 Refractory anemia with excess of blasts 1: Secondary | ICD-10-CM

## 2024-02-08 DIAGNOSIS — D708 Other neutropenia: Secondary | ICD-10-CM

## 2024-02-08 DIAGNOSIS — D709 Neutropenia, unspecified: Secondary | ICD-10-CM

## 2024-02-08 LAB — CBC WITH DIFFERENTIAL (CANCER CENTER ONLY)
Abs Immature Granulocytes: 0.01 K/uL (ref 0.00–0.07)
Basophils Absolute: 0 K/uL (ref 0.0–0.1)
Basophils Relative: 1 %
Eosinophils Absolute: 0 K/uL (ref 0.0–0.5)
Eosinophils Relative: 1 %
HCT: 36.1 % — ABNORMAL LOW (ref 39.0–52.0)
Hemoglobin: 12.3 g/dL — ABNORMAL LOW (ref 13.0–17.0)
Immature Granulocytes: 1 %
Lymphocytes Relative: 17 %
Lymphs Abs: 0.2 K/uL — ABNORMAL LOW (ref 0.7–4.0)
MCH: 34.7 pg — ABNORMAL HIGH (ref 26.0–34.0)
MCHC: 34.1 g/dL (ref 30.0–36.0)
MCV: 102 fL — ABNORMAL HIGH (ref 80.0–100.0)
Monocytes Absolute: 0.2 K/uL (ref 0.1–1.0)
Monocytes Relative: 19 %
Neutro Abs: 0.6 K/uL — ABNORMAL LOW (ref 1.7–7.7)
Neutrophils Relative %: 61 %
Platelet Count: 117 K/uL — ABNORMAL LOW (ref 150–400)
RBC: 3.54 MIL/uL — ABNORMAL LOW (ref 4.22–5.81)
RDW: 16.7 % — ABNORMAL HIGH (ref 11.5–15.5)
WBC Count: 1 K/uL — ABNORMAL LOW (ref 4.0–10.5)
nRBC: 0 % (ref 0.0–0.2)

## 2024-02-08 LAB — CMP (CANCER CENTER ONLY)
ALT: 27 U/L (ref 0–44)
AST: 26 U/L (ref 15–41)
Albumin: 4.7 g/dL (ref 3.5–5.0)
Alkaline Phosphatase: 80 U/L (ref 38–126)
Anion gap: 10 (ref 5–15)
BUN: 16 mg/dL (ref 8–23)
CO2: 28 mmol/L (ref 22–32)
Calcium: 10.1 mg/dL (ref 8.9–10.3)
Chloride: 104 mmol/L (ref 98–111)
Creatinine: 1.09 mg/dL (ref 0.61–1.24)
GFR, Estimated: >60 mL/min (ref >60)
Glucose, Bld: 166 mg/dL — ABNORMAL HIGH (ref 70–99)
Potassium: 4.2 mmol/L (ref 3.5–5.1)
Sodium: 142 mmol/L (ref 135–145)
Total Bilirubin: 0.5 mg/dL (ref 0.0–1.2)
Total Protein: 6.6 g/dL (ref 6.5–8.1)

## 2024-02-08 LAB — SAMPLE TO BLOOD BANK

## 2024-02-08 LAB — D-DIMER, QUANTITATIVE: D-Dimer, Quant: 0.74 ug{FEU}/mL — ABNORMAL HIGH (ref 0.00–0.50)

## 2024-02-08 LAB — PROTIME-INR
INR: 0.9 (ref 0.8–1.2)
Prothrombin Time: 13.2 s (ref 11.4–15.2)

## 2024-02-08 LAB — PHOSPHORUS: Phosphorus: 2.6 mg/dL (ref 2.5–4.6)

## 2024-02-08 LAB — MAGNESIUM: Magnesium: 2.1 mg/dL (ref 1.7–2.4)

## 2024-02-08 LAB — SAVE SMEAR(SSMR), FOR PROVIDER SLIDE REVIEW

## 2024-02-08 LAB — LACTATE DEHYDROGENASE: LDH: 283 U/L — ABNORMAL HIGH (ref 105–235)

## 2024-02-08 LAB — URIC ACID: Uric Acid, Serum: 3.4 mg/dL — ABNORMAL LOW (ref 3.7–8.6)

## 2024-02-08 LAB — FIBRINOGEN: Fibrinogen: 367 mg/dL (ref 210–475)

## 2024-02-08 LAB — APTT: aPTT: 36 s (ref 24–36)

## 2024-02-08 MED ORDER — SODIUM CHLORIDE 0.9 % IV SOLN: INTRAVENOUS | Status: DC

## 2024-02-08 MED ORDER — ONDANSETRON HCL 8 MG PO TABS: 8.0000 mg | ORAL_TABLET | Freq: Three times a day (TID) | ORAL | 2 refills | Status: AC | PRN

## 2024-02-08 MED ORDER — PROCHLORPERAZINE MALEATE 10 MG PO TABS: 10.0000 mg | ORAL_TABLET | Freq: Once | ORAL | Status: DC

## 2024-02-08 MED ORDER — SODIUM CHLORIDE 0.9 % IV SOLN
20.0000 mg/m2 | Freq: Once | INTRAVENOUS | Status: AC
  Administered 2024-02-08: 10:00:00 40 mg via INTRAVENOUS
  Filled 2024-02-08: qty 8

## 2024-02-08 NOTE — Patient Instructions (Signed)
 PICC Home Care Guide A peripherally inserted central catheter (PICC) is a form of IV access that allows medicines and IV fluids to be quickly put into the blood and spread throughout the body. The PICC is a long, thin, flexible tube (catheter) that is put into a vein in a person's arm or leg. The catheter ends in a large vein just outside the heart called the superior vena cava (SVC). After the PICC is put in, a chest X-ray may be done to make sure that it is in the right place. A PICC may be placed for different reasons, such as: To give medicines and liquid nutrition. To give IV fluids and blood products. To take blood samples often. If there is trouble placing a peripheral intravenous (PIV) catheter. If cared for properly, a PICC can remain in place for many months. Having a PICC can allow you to go home from the hospital sooner and continue treatment at home. Medicines and PICC care can be managed at home by a family member, caregiver, or home health care team. What are the risks? Generally, having a PICC is safe. However, problems may occur, including: A blood clot (thrombus) forming in or at the end of the PICC. A blood clot forming in a vein (deep vein thrombosis) or traveling to the lung (pulmonary embolism). Inflammation of the vein (phlebitis) in which the PICC is placed. Infection at the insertion site or in the blood. Blood infections from central lines, like PICCs, can be serious and often require a hospital stay. PICC malposition, or PICC movement or poor placement. A break or cut in the PICC. Do not use scissors near the PICC. Nerve or tendon irritation or injury during PICC insertion. How to care for your PICC Please follow the specific guidelines provided by your health care provider. Preventing infection You and any caregivers should wash your hands often with soap and water for at least 20 seconds. Wash hands: Before touching the PICC or the infusion device. Before changing a  bandage (dressing). Do not change the dressing unless you have been taught to do so and have shown you are able to change it safely. Flush the PICC as told. Tell your health care provider right away if the PICC is hard to flush or does not flush. Do not use force to flush the PICC. Use clean and germ-free (sterile) supplies only. Keep the supplies in a dry place. Do not reuse needles, syringes, or any other supplies. Reusing supplies can lead to infection. Keep the PICC dressing dry and secure it with tape if the edges stop sticking to your skin. Check your PICC insertion site every day for signs of infection. Check for: Redness, swelling, or pain. Fluid or blood. Warmth. Pus or a bad smell. Preventing other problems Do not use a syringe that is less than 10 mL to flush the PICC. Do not have your blood pressure checked on the arm in which the PICC is placed. Do not ever pull or tug on the PICC. Keep it secured to your arm with tape or a stretch wrap when not in use. Do not take the PICC out yourself. Only a trained health care provider should remove the PICC. Keep pets and children away from your PICC. How to care for your PICC dressing Keep your PICC dressing clean and dry to prevent infection. Do not take baths, swim, or use a hot tub until your health care provider approves. Ask your health care provider if you can take  showers. You may only be allowed to take sponge baths. When you are allowed to shower: Ask your health care provider to teach you how to wrap the PICC. Cover the PICC with clear plastic wrap and tape to keep it dry while showering. Follow instructions from your health care provider about how to take care of your insertion site and dressing. Make sure you: Wash your hands with soap and water for at least 20 seconds before and after you change your dressing. If soap and water are not available, use hand sanitizer. Change your dressing only if taught to do so by your health care  provider. Your PICC dressing needs to be changed if it becomes loose or wet. Leave stitches (sutures), skin glue, or adhesive strips in place. These skin closures may need to stay in place for 2 weeks or longer. If adhesive strip edges start to loosen and curl up, you may trim the loose edges. Do not remove adhesive strips completely unless your health care provider tells you to do that. Follow these instructions at home: Disposal of supplies Throw away any syringes in a disposal container that is meant for sharp items (sharps container). You can buy a sharps container from a pharmacy, or you can make one by using an empty, hard plastic bottle with a lid. Place any used dressings or infusion bags into a plastic bag. Throw that bag in the trash. General instructions  Always carry your PICC identification card or wear a medical alert bracelet. Keep the tube clamped at all times, unless it is being used. Always carry a smooth-edge clamp with you to clamp the PICC if it breaks. Do not use scissors or sharp objects near the tube. You may bend your arm and move it freely. If your PICC is near or at the bend of your elbow, avoid activity with repeated motion at the elbow. Avoid lifting heavy objects as told by your health care provider. Keep all follow-up visits. This is important. You will need to have your PICC dressing changed at least once a week. Contact a health care provider if: You have pain in your arm, ear, face, or teeth. You have a fever or chills. You have redness, swelling, or pain around the insertion site. You have fluid or blood coming from the insertion site. Your insertion site feels warm to the touch. You have pus or a bad smell coming from the insertion site. Your skin feels hard and raised around the insertion site. Your PICC dressing has gotten wet or is coming off and you have not been taught how to change it. Get help right away if: You have problems with your PICC, such as  your PICC: Was tugged or pulled and has partially come out. Do not  push the PICC back in. Cannot be flushed, is hard to flush, or leaks around the insertion site when it is flushed. Makes a flushing sound when it is flushed. Appears to have a hole or tear. Is accidentally pulled all the way out. If this happens, cover the insertion site with a gauze dressing. Do not throw the PICC away. Your health care provider will need to check it to be sure the entire catheter came out. You feel your heart racing or skipping beats, or you have chest pain. You have shortness of breath or trouble breathing. You have swelling, redness, warmth, or pain in the arm in which the PICC is placed. You have a red streak going up your arm that  starts under the PICC dressing. These symptoms may be an emergency. Get help right away. Call 911. Do not wait to see if the symptoms will go away. Do not drive yourself to the hospital. Summary A peripherally inserted central catheter (PICC) is a long, thin, flexible tube (catheter) that is put into a vein in the arm or leg. If cared for properly, a PICC can remain in place for many months. Having a PICC can allow you to go home from the hospital sooner and continue treatment at home. The PICC is inserted using a germ-free (sterile) technique by a specially trained health care provider. Only a trained health care provider should remove it. Do not have your blood pressure checked on the arm in which your PICC is placed. Always keep your PICC identification card with you. This information is not intended to replace advice given to you by your health care provider. Make sure you discuss any questions you have with your health care provider. Document Revised: 08/29/2020 Document Reviewed: 08/29/2020 Elsevier Patient Education  2024 [employer].

## 2024-02-08 NOTE — Progress Notes (Signed)
 MD reviewed CBC and CMET. VO  ok to treat despite counts  Per MD.

## 2024-02-08 NOTE — Progress Notes (Signed)
 9 Hematology and Oncology Follow Up Visit  Patrick [name] [id number] March 17, 1940 83 y.o. 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  He is due much better now.  He feels well.  He had a good weekend.  He has been quite busy.  Unfortunately, his wife is having some issues.  I really hate this for her.  Hopefully, this will be straightened out.  His blood counts are coming up nicely.  We will go ahead with his second cycle of decitabine  today.  He has had no problems with fever.  He has had no bleeding.  I did get 1 episode of nasal epistaxis a week or so ago.  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  He has had no rashes.  He has occasional swelling about the ankles.  He has had no obvious change in bowel or bladder habits.  He has had no cough.  Currently, I would have to say that his performance status is probably ECOG 1.   Medications:  Current Outpatient Medications:    itraconazole  (SPORANOX ) 100 MG capsule, 100 mg daily., Disp: , Rfl:    amLODipine  (NORVASC ) 2.5 MG tablet, Take 3 tablets (7.5 mg total) by mouth daily. Do not take if SBP < 110, Disp: 270 tablet, Rfl: 2   atorvastatin  (LIPITOR) 10 MG tablet, TAKE ONE TABLET BY MOUTH ONE TIME DAILY, Disp: 90 tablet, Rfl: 2   diphenoxylate -atropine  (LOMOTIL ) 2.5-0.025 MG tablet, Take 1 tablet by mouth 4 (four) times daily as needed for diarrhea or loose stools., Disp: 60 tablet, Rfl: 1   enoxaparin  (LOVENOX ) 120 MG/0.8ML injection, Inject 0.8 mLs (120 mg total) into the skin daily., Disp: 24 mL, Rfl: 12   famciclovir  (FAMVIR ) 250 MG  tablet, Take 250 mg by mouth daily., Disp: , Rfl:    hydrocortisone 2.5 % cream, Apply 1 Application topically daily as needed., Disp: , Rfl:    levofloxacin  (LEVAQUIN ) 500 MG tablet, Take 1 tablet (500 mg total) by mouth daily., Disp: 30 tablet, Rfl: 2   ondansetron  (ZOFRAN ) 8 MG tablet, Take 1 tablet (8 mg total) by mouth every 8 (eight) hours as needed., Disp: 20 tablet, Rfl: 2   pantoprazole  (PROTONIX ) 40 MG tablet, Take 40 mg by mouth daily., Disp: , Rfl:    posaconazole  (NOXAFIL ) 100 MG TBEC delayed-release tablet, Take 3 tablets (300 mg total) by mouth daily., Disp: , Rfl:    prochlorperazine  (COMPAZINE ) 10 MG tablet, Take 1 tablet (10 mg total) by mouth every 6 (six) hours as needed for nausea or vomiting., Disp: 30 tablet, Rfl: 2   rOPINIRole  (REQUIP ) 0.25 MG tablet, TAKE ONE TABLET BY MOUTH 1 HOUR BEFORE BEDTIME FOR 3 DAYS, THEN TAKE TWO TABLETS BY MOUTH 1 HOUR BEFORE BEDTIME FOR 7 DAYS, THEN TAKE FOUR TABLETS BY MOUTH 1 HOUR BEFORE BEDTIME, Disp: 60 tablet, Rfl: 2   Sodium Chloride  Flush (NORMAL SALINE FLUSH) 0.9 % SOLN, Inject 10 mLs into the PICC line daily., Disp: 600 mL, Rfl: 1   SUMAtriptan  (IMITREX ) 25 MG tablet, TAKE ONE TABLET BY MOUTH AT ONSET OF  HEADACHE. MAY REPEAT DOSE WITH ONE TABLET IN TWO HOURS IF NEEDED. DO NOT EXCEED TWO TABLETS IN 24 HOURS., Disp: 12 tablet, Rfl: 0   tadalafil  (CIALIS ) 5 MG tablet, Take 1 tablet (5 mg total) by mouth daily. (Patient not taking: Reported on 01/04/2024), Disp: 90 tablet, Rfl: 3   venetoclax  (VENCLEXTA ) 100 MG tablet, Take 1 tablet (100 mg total) by mouth daily. Take one tablet by mouth for 7 days.  Tablets should be swallowed whole with a meal and a full glass of water., Disp: 7 tablet, Rfl: 0 No current facility-administered medications for this visit.  Facility-Administered Medications Ordered in Other Visits:    phosphorus (K PHOS  NEUTRAL) tablet 1,000 mg, 1,000 mg, Oral, Once, Qusai Kem, Maude SAUNDERS, MD   prochlorperazine  (COMPAZINE ) tablet  10 mg, 10 mg, Oral, Once, Uziah Sorter, Maude SAUNDERS, MD  Allergies:  Allergies  Allergen Reactions   Codeine  Rash, Dermatitis and Other (See Comments)    ...   Terbinafine Itching    Past Medical History, Surgical history, Social history, and Family History were reviewed and updated.  Review of Systems: Review of Systems  Constitutional: Negative.   HENT:  Negative.    Eyes: Negative.   Respiratory: Negative.    Cardiovascular: Negative.   Gastrointestinal: Negative.   Endocrine: Negative.   Genitourinary: Negative.    Musculoskeletal: Negative.   Skin: Negative.   Neurological: Negative.   Hematological: Negative.   Psychiatric/Behavioral: Negative.      Physical Exam:  Vital signs show temperature of 97.3.  Pulse 63.  Blood pressure is 140/53.  Weight is 163 pounds.    Wt Readings from Last 3 Encounters:  02/01/24 162 lb (73.5 kg)  01/04/24 160 lb 12.8 oz (72.9 kg)  12/07/23 162 lb (73.5 kg)    Physical Exam Vitals reviewed.  HENT:     Head: Normocephalic and atraumatic.  Eyes:     Pupils: Pupils are equal, round, and reactive to light.     Comments: Ocular exam does show the conjunctival hemorrhage with the right eye.  Pupil reacts appropriately.  He has good extraocular muscle movement.  Cardiovascular:     Rate and Rhythm: Normal rate and regular rhythm.     Heart sounds: Normal heart sounds.     Comments: Cardiac exam shows a regular rate and rhythm.  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  I hear no murmurs or rubs or bruits. Pulmonary:     Effort: Pulmonary effort is normal.     Breath sounds: Normal breath sounds.  Abdominal:     General: Bowel sounds are normal.     Palpations: Abdomen is soft.  Musculoskeletal:        General: No tenderness or deformity. Normal range of motion.     Cervical back: Normal range of motion.  Lymphadenopathy:     Cervical: No cervical adenopathy.  Skin:    General: Skin is warm and dry.      Findings: No erythema or rash.  Neurological:     Mental Status: He is alert and oriented to person, place, and time.  Psychiatric:        Behavior: Behavior normal.        Thought Content: Thought content normal.        Judgment: Judgment normal.      Lab Results  Component Value Date   WBC 1.0 (L) 02/08/2024   HGB 12.3 (L) 02/08/2024   HCT 36.1 (L) 02/08/2024   MCV  102.0 (H) 02/08/2024   PLT 117 (L) 02/08/2024     Chemistry      Component Value Date/Time   NA 139 02/04/2024 0815   NA 139 11/12/2020 1030   K 3.8 02/04/2024 0815   CL 105 02/04/2024 0815   CO2 26 02/04/2024 0815   BUN 15 02/04/2024 0815   BUN 16 11/12/2020 1030   CREATININE 1.01 02/04/2024 0815      Component Value Date/Time   CALCIUM  9.4 02/04/2024 0815   ALKPHOS 74 02/04/2024 0815   AST 21 02/04/2024 0815   ALT 24 02/04/2024 0815   BILITOT 0.5 02/04/2024 0815       Impression and Plan: Patrick [name] is a 83 year old white male.  He has had mild leukopenia and thrombocytopenia. I did do a bone marrow biopsy on him.  This confirmed that he has myelodysplasia.  Again, I would classify him as refractory anemia with excess blast-1  (RAEB-1).  We went ahead and treated him with azacitidine .  He there was found to have progressive disease.  He subsequently went down to [facility].  He was placed on protocol therapy there.  He did incredibly well.  He got into remission.  He now is having maintenance treatment.  In [place] .  He has done well so far.  His blood counts seem to be coming back nicely.  Will go ahead with his second cycle of decitabine .  He will then go down to [facility] in early January.  I am sure he will have another bone marrow biopsy at that time.  I am just happy that he is doing so well.  I had to believe that he has had a very nice response.  We will certainly stay strong in prayer for his poor wife.  We will plan to get him back to see us  after [facility].  He will let us   know when he comes back.    Maude JONELLE Crease, MD 12/15/20259:27 AM

## 2024-02-09 ENCOUNTER — Inpatient Hospital Stay

## 2024-02-09 VITALS — BP 154/63 | HR 60 | Temp 97.8°F | Resp 18

## 2024-02-09 DIAGNOSIS — D4621 Refractory anemia with excess of blasts 1: Secondary | ICD-10-CM

## 2024-02-09 DIAGNOSIS — D46Z Other myelodysplastic syndromes: Secondary | ICD-10-CM

## 2024-02-09 DIAGNOSIS — Z5111 Encounter for antineoplastic chemotherapy: Secondary | ICD-10-CM | POA: Diagnosis not present

## 2024-02-09 DIAGNOSIS — C9201 Acute myeloblastic leukemia, in remission: Secondary | ICD-10-CM

## 2024-02-09 MED ORDER — PROCHLORPERAZINE MALEATE 10 MG PO TABS
10.0000 mg | ORAL_TABLET | Freq: Once | ORAL | Status: AC
  Administered 2024-02-09: 08:00:00 10 mg via ORAL
  Filled 2024-02-09: qty 1

## 2024-02-09 MED ORDER — SODIUM CHLORIDE 0.9 % IV SOLN: INTRAVENOUS | Status: DC

## 2024-02-09 MED ORDER — SODIUM CHLORIDE 0.9 % IV SOLN
20.0000 mg/m2 | Freq: Once | INTRAVENOUS | Status: AC
  Administered 2024-02-09: 09:00:00 40 mg via INTRAVENOUS
  Filled 2024-02-09: qty 8

## 2024-02-09 NOTE — Patient Instructions (Signed)
 [facility] - A DEPT OF [facility]  Discharge Instructions: Thank you for choosing [facility] to provide your oncology and hematology care.   If you have a lab appointment with the Cancer Center, please go directly to the Cancer Center and check in at the registration area.  Wear comfortable clothing and clothing appropriate for easy access to any Portacath or PICC line.   We strive to give you quality time with your provider. You may need to reschedule your appointment if you arrive late (15 or more minutes).  Arriving late affects you and other patients whose appointments are after yours.  Also, if you miss three or more appointments without notifying the office, you may be dismissed from the clinic at the provider's discretion.      For prescription refill requests, have your pharmacy contact our office and allow 72 hours for refills to be completed.    Today you received the following chemotherapy and/or immunotherapy agents Dacogen      To help prevent nausea and vomiting after your treatment, we encourage you to take your nausea medication as directed.  BELOW ARE SYMPTOMS THAT SHOULD BE REPORTED IMMEDIATELY: *FEVER GREATER THAN 100.4 F (38 C) OR HIGHER *CHILLS OR SWEATING *NAUSEA AND VOMITING THAT IS NOT CONTROLLED WITH YOUR NAUSEA MEDICATION *UNUSUAL SHORTNESS OF BREATH *UNUSUAL BRUISING OR BLEEDING *URINARY PROBLEMS (pain or burning when urinating, or frequent urination) *BOWEL PROBLEMS (unusual diarrhea, constipation, pain near the anus) TENDERNESS IN MOUTH AND THROAT WITH OR WITHOUT PRESENCE OF ULCERS (sore throat, sores in mouth, or a toothache) UNUSUAL RASH, SWELLING OR PAIN  UNUSUAL VAGINAL DISCHARGE OR ITCHING   Items with * indicate a potential emergency and should be followed up as soon as possible or go to the Emergency Department if any problems should occur.  Please show the CHEMOTHERAPY ALERT CARD or IMMUNOTHERAPY  ALERT CARD at check-in to the Emergency Department and triage nurse. Should you have questions after your visit or need to cancel or reschedule your appointment, please contact [facility] - A DEPT OF [facility]  [phone] and follow the prompts.  Office hours are 8:00 a.m. to 4:30 p.m. Monday - Friday. Please note that voicemails left after 4:00 p.m. may not be returned until the following business day.  We are closed weekends and major holidays. You have access to a nurse at all times for urgent questions. Please call the main number to the clinic [phone] and follow the prompts.  For any non-urgent questions, you may also contact your provider using MyChart. We now offer e-Visits for anyone 1 and older to request care online for non-urgent symptoms. For details visit [email or web address].   Also download the MyChart app! Go to the app store, search "MyChart", open the app, select [place], and log in with your MyChart username and password.

## 2024-02-10 ENCOUNTER — Inpatient Hospital Stay

## 2024-02-10 VITALS — BP 155/72 | HR 55 | Temp 97.7°F | Resp 20

## 2024-02-10 DIAGNOSIS — C9201 Acute myeloblastic leukemia, in remission: Secondary | ICD-10-CM

## 2024-02-10 DIAGNOSIS — D4621 Refractory anemia with excess of blasts 1: Secondary | ICD-10-CM

## 2024-02-10 DIAGNOSIS — D46Z Other myelodysplastic syndromes: Secondary | ICD-10-CM

## 2024-02-10 DIAGNOSIS — Z5111 Encounter for antineoplastic chemotherapy: Secondary | ICD-10-CM | POA: Diagnosis not present

## 2024-02-10 MED ORDER — SODIUM CHLORIDE 0.9 % IV SOLN: INTRAVENOUS | Status: DC

## 2024-02-10 MED ORDER — SODIUM CHLORIDE 0.9 % IV SOLN
20.0000 mg/m2 | Freq: Once | INTRAVENOUS | Status: AC
  Administered 2024-02-10: 09:00:00 40 mg via INTRAVENOUS
  Filled 2024-02-10: qty 8

## 2024-02-10 MED ORDER — PROCHLORPERAZINE MALEATE 10 MG PO TABS: 10.0000 mg | ORAL_TABLET | Freq: Once | ORAL | Status: DC

## 2024-02-10 NOTE — Patient Instructions (Signed)
 [facility] - A DEPT OF [facility]  Discharge Instructions: Thank you for choosing [facility] to provide your oncology and hematology care.   If you have a lab appointment with the Cancer Center, please go directly to the Cancer Center and check in at the registration area.  Wear comfortable clothing and clothing appropriate for easy access to any Portacath or PICC line.   We strive to give you quality time with your provider. You may need to reschedule your appointment if you arrive late (15 or more minutes).  Arriving late affects you and other patients whose appointments are after yours.  Also, if you miss three or more appointments without notifying the office, you may be dismissed from the clinic at the provider's discretion.      For prescription refill requests, have your pharmacy contact our office and allow 72 hours for refills to be completed.    Today you received the following chemotherapy and/or immunotherapy agents Dacogen      To help prevent nausea and vomiting after your treatment, we encourage you to take your nausea medication as directed.  BELOW ARE SYMPTOMS THAT SHOULD BE REPORTED IMMEDIATELY: *FEVER GREATER THAN 100.4 F (38 C) OR HIGHER *CHILLS OR SWEATING *NAUSEA AND VOMITING THAT IS NOT CONTROLLED WITH YOUR NAUSEA MEDICATION *UNUSUAL SHORTNESS OF BREATH *UNUSUAL BRUISING OR BLEEDING *URINARY PROBLEMS (pain or burning when urinating, or frequent urination) *BOWEL PROBLEMS (unusual diarrhea, constipation, pain near the anus) TENDERNESS IN MOUTH AND THROAT WITH OR WITHOUT PRESENCE OF ULCERS (sore throat, sores in mouth, or a toothache) UNUSUAL RASH, SWELLING OR PAIN  UNUSUAL VAGINAL DISCHARGE OR ITCHING   Items with * indicate a potential emergency and should be followed up as soon as possible or go to the Emergency Department if any problems should occur.  Please show the CHEMOTHERAPY ALERT CARD or IMMUNOTHERAPY  ALERT CARD at check-in to the Emergency Department and triage nurse. Should you have questions after your visit or need to cancel or reschedule your appointment, please contact [facility] - A DEPT OF [facility]  [phone] and follow the prompts.  Office hours are 8:00 a.m. to 4:30 p.m. Monday - Friday. Please note that voicemails left after 4:00 p.m. may not be returned until the following business day.  We are closed weekends and major holidays. You have access to a nurse at all times for urgent questions. Please call the main number to the clinic [phone] and follow the prompts.  For any non-urgent questions, you may also contact your provider using MyChart. We now offer e-Visits for anyone 1 and older to request care online for non-urgent symptoms. For details visit [email or web address].   Also download the MyChart app! Go to the app store, search "MyChart", open the app, select [place], and log in with your MyChart username and password.

## 2024-02-11 ENCOUNTER — Inpatient Hospital Stay

## 2024-02-11 DIAGNOSIS — D46Z Other myelodysplastic syndromes: Secondary | ICD-10-CM

## 2024-02-11 DIAGNOSIS — D4621 Refractory anemia with excess of blasts 1: Secondary | ICD-10-CM

## 2024-02-11 DIAGNOSIS — C9201 Acute myeloblastic leukemia, in remission: Secondary | ICD-10-CM

## 2024-02-11 DIAGNOSIS — Z5111 Encounter for antineoplastic chemotherapy: Secondary | ICD-10-CM | POA: Diagnosis not present

## 2024-02-11 LAB — CBC WITH DIFFERENTIAL (CANCER CENTER ONLY)
Abs Immature Granulocytes: 0.02 K/uL (ref 0.00–0.07)
Basophils Absolute: 0 K/uL (ref 0.0–0.1)
Basophils Relative: 0 %
Eosinophils Absolute: 0 K/uL (ref 0.0–0.5)
Eosinophils Relative: 1 %
HCT: 33.7 % — ABNORMAL LOW (ref 39.0–52.0)
Hemoglobin: 11.5 g/dL — ABNORMAL LOW (ref 13.0–17.0)
Immature Granulocytes: 1 %
Lymphocytes Relative: 8 %
Lymphs Abs: 0.1 K/uL — ABNORMAL LOW (ref 0.7–4.0)
MCH: 34.4 pg — ABNORMAL HIGH (ref 26.0–34.0)
MCHC: 34.1 g/dL (ref 30.0–36.0)
MCV: 100.9 fL — ABNORMAL HIGH (ref 80.0–100.0)
Monocytes Absolute: 0.2 K/uL (ref 0.1–1.0)
Monocytes Relative: 13 %
Neutro Abs: 1.1 K/uL — ABNORMAL LOW (ref 1.7–7.7)
Neutrophils Relative %: 77 %
Platelet Count: 96 K/uL — ABNORMAL LOW (ref 150–400)
RBC: 3.34 MIL/uL — ABNORMAL LOW (ref 4.22–5.81)
RDW: 16.6 % — ABNORMAL HIGH (ref 11.5–15.5)
WBC Count: 1.4 K/uL — ABNORMAL LOW (ref 4.0–10.5)
nRBC: 0 % (ref 0.0–0.2)

## 2024-02-11 LAB — CMP (CANCER CENTER ONLY)
ALT: 30 U/L (ref 0–44)
AST: 27 U/L (ref 15–41)
Albumin: 4.3 g/dL (ref 3.5–5.0)
Alkaline Phosphatase: 71 U/L (ref 38–126)
Anion gap: 9 (ref 5–15)
BUN: 17 mg/dL (ref 8–23)
CO2: 25 mmol/L (ref 22–32)
Calcium: 9.7 mg/dL (ref 8.9–10.3)
Chloride: 104 mmol/L (ref 98–111)
Creatinine: 1.04 mg/dL (ref 0.61–1.24)
GFR, Estimated: >60 mL/min (ref >60)
Glucose, Bld: 140 mg/dL — ABNORMAL HIGH (ref 70–99)
Potassium: 3.9 mmol/L (ref 3.5–5.1)
Sodium: 138 mmol/L (ref 135–145)
Total Bilirubin: 0.6 mg/dL (ref 0.0–1.2)
Total Protein: 5.9 g/dL — ABNORMAL LOW (ref 6.5–8.1)

## 2024-02-11 MED ORDER — SODIUM CHLORIDE 0.9 % IV SOLN
20.0000 mg/m2 | Freq: Once | INTRAVENOUS | Status: AC
  Administered 2024-02-11: 09:00:00 40 mg via INTRAVENOUS
  Filled 2024-02-11: qty 8

## 2024-02-11 MED ORDER — SODIUM CHLORIDE 0.9 % IV SOLN: INTRAVENOUS | Status: DC

## 2024-02-11 MED ORDER — PROCHLORPERAZINE MALEATE 10 MG PO TABS: 10.0000 mg | ORAL_TABLET | Freq: Once | ORAL | Status: DC

## 2024-02-11 NOTE — Patient Instructions (Signed)
 [facility] - A DEPT OF [facility]  Discharge Instructions: Thank you for choosing [facility] to provide your oncology and hematology care.   If you have a lab appointment with the Cancer Center, please go directly to the Cancer Center and check in at the registration area.  Wear comfortable clothing and clothing appropriate for easy access to any Portacath or PICC line.   We strive to give you quality time with your provider. You may need to reschedule your appointment if you arrive late (15 or more minutes).  Arriving late affects you and other patients whose appointments are after yours.  Also, if you miss three or more appointments without notifying the office, you may be dismissed from the clinic at the provider's discretion.      For prescription refill requests, have your pharmacy contact our office and allow 72 hours for refills to be completed.    Today you received the following chemotherapy and/or immunotherapy agents Dacogen      To help prevent nausea and vomiting after your treatment, we encourage you to take your nausea medication as directed.  BELOW ARE SYMPTOMS THAT SHOULD BE REPORTED IMMEDIATELY: *FEVER GREATER THAN 100.4 F (38 C) OR HIGHER *CHILLS OR SWEATING *NAUSEA AND VOMITING THAT IS NOT CONTROLLED WITH YOUR NAUSEA MEDICATION *UNUSUAL SHORTNESS OF BREATH *UNUSUAL BRUISING OR BLEEDING *URINARY PROBLEMS (pain or burning when urinating, or frequent urination) *BOWEL PROBLEMS (unusual diarrhea, constipation, pain near the anus) TENDERNESS IN MOUTH AND THROAT WITH OR WITHOUT PRESENCE OF ULCERS (sore throat, sores in mouth, or a toothache) UNUSUAL RASH, SWELLING OR PAIN  UNUSUAL VAGINAL DISCHARGE OR ITCHING   Items with * indicate a potential emergency and should be followed up as soon as possible or go to the Emergency Department if any problems should occur.  Please show the CHEMOTHERAPY ALERT CARD or IMMUNOTHERAPY  ALERT CARD at check-in to the Emergency Department and triage nurse. Should you have questions after your visit or need to cancel or reschedule your appointment, please contact [facility] - A DEPT OF [facility]  [phone] and follow the prompts.  Office hours are 8:00 a.m. to 4:30 p.m. Monday - Friday. Please note that voicemails left after 4:00 p.m. may not be returned until the following business day.  We are closed weekends and major holidays. You have access to a nurse at all times for urgent questions. Please call the main number to the clinic [phone] and follow the prompts.  For any non-urgent questions, you may also contact your provider using MyChart. We now offer e-Visits for anyone 1 and older to request care online for non-urgent symptoms. For details visit [email or web address].   Also download the MyChart app! Go to the app store, search "MyChart", open the app, select [place], and log in with your MyChart username and password.

## 2024-02-12 ENCOUNTER — Inpatient Hospital Stay

## 2024-02-12 VITALS — BP 122/71 | HR 74 | Temp 97.8°F | Resp 17 | Ht 71.0 in | Wt 163.0 lb

## 2024-02-12 DIAGNOSIS — D4621 Refractory anemia with excess of blasts 1: Secondary | ICD-10-CM

## 2024-02-12 DIAGNOSIS — Z5111 Encounter for antineoplastic chemotherapy: Secondary | ICD-10-CM | POA: Diagnosis not present

## 2024-02-12 DIAGNOSIS — C9201 Acute myeloblastic leukemia, in remission: Secondary | ICD-10-CM

## 2024-02-12 DIAGNOSIS — D46Z Other myelodysplastic syndromes: Secondary | ICD-10-CM

## 2024-02-12 MED ORDER — SODIUM CHLORIDE 0.9 % IV SOLN: INTRAVENOUS | Status: DC

## 2024-02-12 MED ORDER — SODIUM CHLORIDE 0.9 % IV SOLN
20.0000 mg/m2 | Freq: Once | INTRAVENOUS | Status: AC
  Administered 2024-02-12: 40 mg via INTRAVENOUS
  Filled 2024-02-12: qty 8

## 2024-02-12 MED ORDER — PROCHLORPERAZINE MALEATE 10 MG PO TABS: 10.0000 mg | ORAL_TABLET | Freq: Once | ORAL | Status: DC

## 2024-02-12 NOTE — Patient Instructions (Signed)
Decitabine Injection What is this medication? DECITABINE (dee SYE ta been) treats blood and bone marrow cancers. It works by slowing down the growth of cancer cells. This medicine may be used for other purposes; ask your health care provider or pharmacist if you have questions. COMMON BRAND NAME(S): Dacogen What should I tell my care team before I take this medication? They need to know if you have any of these conditions: Infection Kidney disease Liver disease An unusual or allergic reaction to decitabine, other medications, foods, dyes, or preservatives Pregnant or trying to get pregnant Breast-feeding How should I use this medication? This medication is infused into a vein. It is given by your care team in a hospital or clinic setting. Talk to your care team about the use of this medication in children. Special care may be needed. Overdosage: If you think you have taken too much of this medicine contact a poison control center or emergency room at once. NOTE: This medicine is only for you. Do not share this medicine with others. What if I miss a dose? Keep appointments for follow-up doses. It is important not to miss your dose. Call your care team if you are unable to keep an appointment. What may interact with this medication? Interactions are not expected. This list may not describe all possible interactions. Give your health care provider a list of all the medicines, herbs, non-prescription drugs, or dietary supplements you use. Also tell them if you smoke, drink alcohol, or use illegal drugs. Some items may interact with your medicine. What should I watch for while using this medication? Visit your care team for regular checks on your progress. You may need blood work while taking this medication. This medication may make you feel generally unwell. This is not uncommon as chemotherapy can affect healthy cells as well as cancer cells. Report any side effects. Continue your course of  treatment even though you feel ill unless your care team tells you to stop. This medication may increase your risk of getting an infection. Call your care team for advice if you get a fever, chills, sore throat, or other symptoms of a cold or flu. Do not treat yourself. Try to avoid being around people who are sick. Be careful brushing or flossing your teeth or using a toothpick because you may get an infection or bleed more easily. If you have any dental work done, tell your dentist you are receiving this medication. Call your care team if you are around anyone with measles, chickenpox, or if you develop sores or blisters that do not heal properly. Avoid taking medications that contain aspirin, acetaminophen, ibuprofen, naproxen, or ketoprofen unless instructed by your care team. These medications may hide a fever. Talk to your care team if you or your partner wish to become pregnant or think either of you might be pregnant. This medication can cause serious birth defects if taken during pregnancy or for 6 months after the last dose. A negative pregnancy test is required before starting this medication. A reliable form of contraception is recommended while taking this medication and for 6 months after the last dose. Talk to your care team about reliable forms of contraception. Do not father a child while taking this medication or for 3 months after the last dose. Use a condom while having sex during this time period. Do not breast-feed while taking this medication and for 2 weeks after the last dose. This medication may cause infertility. Talk to your care team if  you are concerned about your fertility. What side effects may I notice from receiving this medication? Side effects that you should report to your care team as soon as possible: Allergic reactions--skin rash, itching, hives, swelling of the face, lips, tongue, or throat Infection--fever, chills, cough, sore throat, wounds that don't heal, pain  or trouble when passing urine, general feeling of discomfort or being unwell Low red blood cell level--unusual weakness or fatigue, dizziness, headache, trouble breathing Unusual bruising or bleeding Side effects that usually do not require medical attention (report to your care team if they continue or are bothersome): Constipation Diarrhea Fatigue Nausea Pain, redness, or swelling with sores inside the mouth or throat Stomach pain This list may not describe all possible side effects. Call your doctor for medical advice about side effects. You may report side effects to FDA at 1-800-FDA-1088. Where should I keep my medication? This medication is given in a hospital or clinic. It will not be stored at home. NOTE: This sheet is a summary. It may not cover all possible information. If you have questions about this medicine, talk to your doctor, pharmacist, or health care provider.  2024 Elsevier/Gold Standard (2021-05-22 00:00:00)

## 2024-02-15 ENCOUNTER — Inpatient Hospital Stay

## 2024-02-15 ENCOUNTER — Ambulatory Visit (HOSPITAL_BASED_OUTPATIENT_CLINIC_OR_DEPARTMENT_OTHER)
Admission: RE | Admit: 2024-02-15 | Discharge: 2024-02-15 | Disposition: A | Discharge status: Home / Self Care | Source: Ambulatory Visit | Attending: Hematology & Oncology | Admitting: Hematology & Oncology

## 2024-02-15 ENCOUNTER — Other Ambulatory Visit: Payer: Self-pay | Admitting: Hematology & Oncology

## 2024-02-15 ENCOUNTER — Other Ambulatory Visit: Payer: Self-pay | Admitting: *Deleted

## 2024-02-15 ENCOUNTER — Encounter: Payer: Self-pay | Admitting: Hematology & Oncology

## 2024-02-15 ENCOUNTER — Encounter (HOSPITAL_BASED_OUTPATIENT_CLINIC_OR_DEPARTMENT_OTHER): Payer: Self-pay

## 2024-02-15 DIAGNOSIS — C9201 Acute myeloblastic leukemia, in remission: Secondary | ICD-10-CM

## 2024-02-15 DIAGNOSIS — D72810 Lymphocytopenia: Secondary | ICD-10-CM

## 2024-02-15 DIAGNOSIS — D709 Neutropenia, unspecified: Secondary | ICD-10-CM

## 2024-02-15 DIAGNOSIS — D4621 Refractory anemia with excess of blasts 1: Secondary | ICD-10-CM

## 2024-02-15 DIAGNOSIS — D696 Thrombocytopenia, unspecified: Secondary | ICD-10-CM

## 2024-02-15 DIAGNOSIS — Z5111 Encounter for antineoplastic chemotherapy: Secondary | ICD-10-CM | POA: Diagnosis not present

## 2024-02-15 DIAGNOSIS — D46Z Other myelodysplastic syndromes: Secondary | ICD-10-CM

## 2024-02-15 DIAGNOSIS — D708 Other neutropenia: Secondary | ICD-10-CM

## 2024-02-15 LAB — APTT: aPTT: 34 s (ref 24–36)

## 2024-02-15 LAB — CBC WITH DIFFERENTIAL (CANCER CENTER ONLY)
Abs Immature Granulocytes: 0.02 K/uL (ref 0.00–0.07)
Basophils Absolute: 0 K/uL (ref 0.0–0.1)
Basophils Relative: 0 %
Eosinophils Absolute: 0 K/uL (ref 0.0–0.5)
Eosinophils Relative: 1 %
HCT: 35 % — ABNORMAL LOW (ref 39.0–52.0)
Hemoglobin: 11.9 g/dL — ABNORMAL LOW (ref 13.0–17.0)
Immature Granulocytes: 1 %
Lymphocytes Relative: 7 %
Lymphs Abs: 0.2 K/uL — ABNORMAL LOW (ref 0.7–4.0)
MCH: 33.9 pg (ref 26.0–34.0)
MCHC: 34 g/dL (ref 30.0–36.0)
MCV: 99.7 fL (ref 80.0–100.0)
Monocytes Absolute: 0.2 K/uL (ref 0.1–1.0)
Monocytes Relative: 8 %
Neutro Abs: 1.9 K/uL (ref 1.7–7.7)
Neutrophils Relative %: 83 %
Platelet Count: 65 K/uL — ABNORMAL LOW (ref 150–400)
RBC: 3.51 MIL/uL — ABNORMAL LOW (ref 4.22–5.81)
RDW: 16.1 % — ABNORMAL HIGH (ref 11.5–15.5)
WBC Count: 2.3 K/uL — ABNORMAL LOW (ref 4.0–10.5)
nRBC: 0 % (ref 0.0–0.2)

## 2024-02-15 LAB — D-DIMER, QUANTITATIVE: D-Dimer, Quant: 1.05 ug{FEU}/mL — ABNORMAL HIGH (ref 0.00–0.50)

## 2024-02-15 LAB — LACTATE DEHYDROGENASE: LDH: 263 U/L — ABNORMAL HIGH (ref 105–235)

## 2024-02-15 LAB — CMP (CANCER CENTER ONLY)
ALT: 26 U/L (ref 0–44)
AST: 21 U/L (ref 15–41)
Albumin: 4.4 g/dL (ref 3.5–5.0)
Alkaline Phosphatase: 75 U/L (ref 38–126)
Anion gap: 7 (ref 5–15)
BUN: 20 mg/dL (ref 8–23)
CO2: 29 mmol/L (ref 22–32)
Calcium: 9.9 mg/dL (ref 8.9–10.3)
Chloride: 106 mmol/L (ref 98–111)
Creatinine: 1.1 mg/dL (ref 0.61–1.24)
GFR, Estimated: >60 mL/min
Glucose, Bld: 115 mg/dL — ABNORMAL HIGH (ref 70–99)
Potassium: 4.8 mmol/L (ref 3.5–5.1)
Sodium: 142 mmol/L (ref 135–145)
Total Bilirubin: 0.6 mg/dL (ref 0.0–1.2)
Total Protein: 6.2 g/dL — ABNORMAL LOW (ref 6.5–8.1)

## 2024-02-15 LAB — PHOSPHORUS: Phosphorus: 3.1 mg/dL (ref 2.5–4.6)

## 2024-02-15 LAB — FIBRINOGEN: Fibrinogen: 352 mg/dL (ref 210–475)

## 2024-02-15 LAB — PROTIME-INR
INR: 1 (ref 0.8–1.2)
Prothrombin Time: 14.2 s (ref 11.4–15.2)

## 2024-02-15 LAB — MAGNESIUM: Magnesium: 2.3 mg/dL (ref 1.7–2.4)

## 2024-02-15 LAB — URIC ACID: Uric Acid, Serum: 3.6 mg/dL — ABNORMAL LOW (ref 3.7–8.6)

## 2024-02-15 LAB — SAMPLE TO BLOOD BANK

## 2024-02-15 MED ORDER — IOHEXOL 300 MG/ML  SOLN
100.0000 mL | Freq: Once | INTRAMUSCULAR | Status: AC | PRN
  Administered 2024-02-15: 100 mL via INTRAVENOUS

## 2024-02-15 NOTE — Patient Instructions (Signed)
 PICC Home Care Guide A peripherally inserted central catheter (PICC) is a form of IV access that allows medicines and IV fluids to be quickly put into the blood and spread throughout the body. The PICC is a long, thin, flexible tube (catheter) that is put into a vein in a person's arm or leg. The catheter ends in a large vein just outside the heart called the superior vena cava (SVC). After the PICC is put in, a chest X-ray may be done to make sure that it is in the right place. A PICC may be placed for different reasons, such as: To give medicines and liquid nutrition. To give IV fluids and blood products. To take blood samples often. If there is trouble placing a peripheral intravenous (PIV) catheter. If cared for properly, a PICC can remain in place for many months. Having a PICC can allow you to go home from the hospital sooner and continue treatment at home. Medicines and PICC care can be managed at home by a family member, caregiver, or home health care team. What are the risks? Generally, having a PICC is safe. However, problems may occur, including: A blood clot (thrombus) forming in or at the end of the PICC. A blood clot forming in a vein (deep vein thrombosis) or traveling to the lung (pulmonary embolism). Inflammation of the vein (phlebitis) in which the PICC is placed. Infection at the insertion site or in the blood. Blood infections from central lines, like PICCs, can be serious and often require a hospital stay. PICC malposition, or PICC movement or poor placement. A break or cut in the PICC. Do not use scissors near the PICC. Nerve or tendon irritation or injury during PICC insertion. How to care for your PICC Please follow the specific guidelines provided by your health care provider. Preventing infection You and any caregivers should wash your hands often with soap and water for at least 20 seconds. Wash hands: Before touching the PICC or the infusion device. Before changing a  bandage (dressing). Do not change the dressing unless you have been taught to do so and have shown you are able to change it safely. Flush the PICC as told. Tell your health care provider right away if the PICC is hard to flush or does not flush. Do not use force to flush the PICC. Use clean and germ-free (sterile) supplies only. Keep the supplies in a dry place. Do not reuse needles, syringes, or any other supplies. Reusing supplies can lead to infection. Keep the PICC dressing dry and secure it with tape if the edges stop sticking to your skin. Check your PICC insertion site every day for signs of infection. Check for: Redness, swelling, or pain. Fluid or blood. Warmth. Pus or a bad smell. Preventing other problems Do not use a syringe that is less than 10 mL to flush the PICC. Do not have your blood pressure checked on the arm in which the PICC is placed. Do not ever pull or tug on the PICC. Keep it secured to your arm with tape or a stretch wrap when not in use. Do not take the PICC out yourself. Only a trained health care provider should remove the PICC. Keep pets and children away from your PICC. How to care for your PICC dressing Keep your PICC dressing clean and dry to prevent infection. Do not take baths, swim, or use a hot tub until your health care provider approves. Ask your health care provider if you can take  showers. You may only be allowed to take sponge baths. When you are allowed to shower: Ask your health care provider to teach you how to wrap the PICC. Cover the PICC with clear plastic wrap and tape to keep it dry while showering. Follow instructions from your health care provider about how to take care of your insertion site and dressing. Make sure you: Wash your hands with soap and water for at least 20 seconds before and after you change your dressing. If soap and water are not available, use hand sanitizer. Change your dressing only if taught to do so by your health care  provider. Your PICC dressing needs to be changed if it becomes loose or wet. Leave stitches (sutures), skin glue, or adhesive strips in place. These skin closures may need to stay in place for 2 weeks or longer. If adhesive strip edges start to loosen and curl up, you may trim the loose edges. Do not remove adhesive strips completely unless your health care provider tells you to do that. Follow these instructions at home: Disposal of supplies Throw away any syringes in a disposal container that is meant for sharp items (sharps container). You can buy a sharps container from a pharmacy, or you can make one by using an empty, hard plastic bottle with a lid. Place any used dressings or infusion bags into a plastic bag. Throw that bag in the trash. General instructions  Always carry your PICC identification card or wear a medical alert bracelet. Keep the tube clamped at all times, unless it is being used. Always carry a smooth-edge clamp with you to clamp the PICC if it breaks. Do not use scissors or sharp objects near the tube. You may bend your arm and move it freely. If your PICC is near or at the bend of your elbow, avoid activity with repeated motion at the elbow. Avoid lifting heavy objects as told by your health care provider. Keep all follow-up visits. This is important. You will need to have your PICC dressing changed at least once a week. Contact a health care provider if: You have pain in your arm, ear, face, or teeth. You have a fever or chills. You have redness, swelling, or pain around the insertion site. You have fluid or blood coming from the insertion site. Your insertion site feels warm to the touch. You have pus or a bad smell coming from the insertion site. Your skin feels hard and raised around the insertion site. Your PICC dressing has gotten wet or is coming off and you have not been taught how to change it. Get help right away if: You have problems with your PICC, such as  your PICC: Was tugged or pulled and has partially come out. Do not  push the PICC back in. Cannot be flushed, is hard to flush, or leaks around the insertion site when it is flushed. Makes a flushing sound when it is flushed. Appears to have a hole or tear. Is accidentally pulled all the way out. If this happens, cover the insertion site with a gauze dressing. Do not throw the PICC away. Your health care provider will need to check it to be sure the entire catheter came out. You feel your heart racing or skipping beats, or you have chest pain. You have shortness of breath or trouble breathing. You have swelling, redness, warmth, or pain in the arm in which the PICC is placed. You have a red streak going up your arm that  starts under the PICC dressing. These symptoms may be an emergency. Get help right away. Call 911. Do not wait to see if the symptoms will go away. Do not drive yourself to the hospital. Summary A peripherally inserted central catheter (PICC) is a long, thin, flexible tube (catheter) that is put into a vein in the arm or leg. If cared for properly, a PICC can remain in place for many months. Having a PICC can allow you to go home from the hospital sooner and continue treatment at home. The PICC is inserted using a germ-free (sterile) technique by a specially trained health care provider. Only a trained health care provider should remove it. Do not have your blood pressure checked on the arm in which your PICC is placed. Always keep your PICC identification card with you. This information is not intended to replace advice given to you by your health care provider. Make sure you discuss any questions you have with your health care provider. Document Revised: 08/29/2020 Document Reviewed: 08/29/2020 Elsevier Patient Education  2024 [employer].

## 2024-02-16 NOTE — Telephone Encounter (Signed)
 I left a message on his answering machine/cellular phone about the results of the CT scan that he had done yesterday.  He does have a nodular lesion.  It measures 1.9 x 1.5 x 1 cm.  It does not invade the musculature.  I think it might be a hematoma.  However, they cannot rule out the possibility of a chloroma.  It does not look like any infection.  I think that we just watch it for right now.  I told him that if he has any problems with this to let us  know.  I know that he is on blood thinners so as to certainly be a hematoma.  Again we will have to watch this closely.  I am pretty sure he comes back to the office early next week.  Again, he is to call us  if he starts to have any problems with it with respect to pain, redness, swelling.  Jeralyn Crease, MD

## 2024-02-17 ENCOUNTER — Inpatient Hospital Stay

## 2024-02-17 DIAGNOSIS — D4621 Refractory anemia with excess of blasts 1: Secondary | ICD-10-CM

## 2024-02-17 DIAGNOSIS — D46Z Other myelodysplastic syndromes: Secondary | ICD-10-CM

## 2024-02-17 DIAGNOSIS — Z5111 Encounter for antineoplastic chemotherapy: Secondary | ICD-10-CM | POA: Diagnosis not present

## 2024-02-17 LAB — CMP (CANCER CENTER ONLY)
ALT: 20 U/L (ref 0–44)
AST: 22 U/L (ref 15–41)
Albumin: 4.2 g/dL (ref 3.5–5.0)
Alkaline Phosphatase: 71 U/L (ref 38–126)
Anion gap: 10 (ref 5–15)
BUN: 20 mg/dL (ref 8–23)
CO2: 24 mmol/L (ref 22–32)
Calcium: 9.5 mg/dL (ref 8.9–10.3)
Chloride: 106 mmol/L (ref 98–111)
Creatinine: 0.99 mg/dL (ref 0.61–1.24)
GFR, Estimated: >60 mL/min
Glucose, Bld: 106 mg/dL — ABNORMAL HIGH (ref 70–99)
Potassium: 4.5 mmol/L (ref 3.5–5.1)
Sodium: 140 mmol/L (ref 135–145)
Total Bilirubin: 0.6 mg/dL (ref 0.0–1.2)
Total Protein: 5.9 g/dL — ABNORMAL LOW (ref 6.5–8.1)

## 2024-02-17 LAB — CBC WITH DIFFERENTIAL (CANCER CENTER ONLY)
Abs Immature Granulocytes: 0.01 K/uL (ref 0.00–0.07)
Basophils Absolute: 0 K/uL (ref 0.0–0.1)
Basophils Relative: 1 %
Eosinophils Absolute: 0 K/uL (ref 0.0–0.5)
Eosinophils Relative: 1 %
HCT: 31.2 % — ABNORMAL LOW (ref 39.0–52.0)
Hemoglobin: 10.8 g/dL — ABNORMAL LOW (ref 13.0–17.0)
Immature Granulocytes: 1 %
Lymphocytes Relative: 7 %
Lymphs Abs: 0.1 K/uL — ABNORMAL LOW (ref 0.7–4.0)
MCH: 34.3 pg — ABNORMAL HIGH (ref 26.0–34.0)
MCHC: 34.6 g/dL (ref 30.0–36.0)
MCV: 99 fL (ref 80.0–100.0)
Monocytes Absolute: 0.1 K/uL (ref 0.1–1.0)
Monocytes Relative: 7 %
Neutro Abs: 1.8 K/uL (ref 1.7–7.7)
Neutrophils Relative %: 83 %
Platelet Count: 43 K/uL — ABNORMAL LOW (ref 150–400)
RBC: 3.15 MIL/uL — ABNORMAL LOW (ref 4.22–5.81)
RDW: 15.8 % — ABNORMAL HIGH (ref 11.5–15.5)
WBC Count: 2.1 K/uL — ABNORMAL LOW (ref 4.0–10.5)
nRBC: 0 % (ref 0.0–0.2)

## 2024-02-17 NOTE — Patient Instructions (Signed)
 [facility] - A DEPT OF [facility]  Discharge Instructions: Thank you for choosing [facility] to provide your oncology and hematology care.   If you have a lab appointment with the Cancer Center, please go directly to the Cancer Center and check in at the registration area.  Wear comfortable clothing and clothing appropriate for easy access to any Portacath or PICC line.   We strive to give you quality time with your provider. You may need to reschedule your appointment if you arrive late (15 or more minutes).  Arriving late affects you and other patients whose appointments are after yours.  Also, if you miss three or more appointments without notifying the office, you may be dismissed from the clinic at the provider's discretion.      For prescription refill requests, have your pharmacy contact our office and allow 72 hours for refills to be completed.    Today you received the following chemotherapy and/or immunotherapy agents Dacogen      To help prevent nausea and vomiting after your treatment, we encourage you to take your nausea medication as directed.  BELOW ARE SYMPTOMS THAT SHOULD BE REPORTED IMMEDIATELY: *FEVER GREATER THAN 100.4 F (38 C) OR HIGHER *CHILLS OR SWEATING *NAUSEA AND VOMITING THAT IS NOT CONTROLLED WITH YOUR NAUSEA MEDICATION *UNUSUAL SHORTNESS OF BREATH *UNUSUAL BRUISING OR BLEEDING *URINARY PROBLEMS (pain or burning when urinating, or frequent urination) *BOWEL PROBLEMS (unusual diarrhea, constipation, pain near the anus) TENDERNESS IN MOUTH AND THROAT WITH OR WITHOUT PRESENCE OF ULCERS (sore throat, sores in mouth, or a toothache) UNUSUAL RASH, SWELLING OR PAIN  UNUSUAL VAGINAL DISCHARGE OR ITCHING   Items with * indicate a potential emergency and should be followed up as soon as possible or go to the Emergency Department if any problems should occur.  Please show the CHEMOTHERAPY ALERT CARD or IMMUNOTHERAPY  ALERT CARD at check-in to the Emergency Department and triage nurse. Should you have questions after your visit or need to cancel or reschedule your appointment, please contact [facility] - A DEPT OF [facility]  [phone] and follow the prompts.  Office hours are 8:00 a.m. to 4:30 p.m. Monday - Friday. Please note that voicemails left after 4:00 p.m. may not be returned until the following business day.  We are closed weekends and major holidays. You have access to a nurse at all times for urgent questions. Please call the main number to the clinic [phone] and follow the prompts.  For any non-urgent questions, you may also contact your provider using MyChart. We now offer e-Visits for anyone 1 and older to request care online for non-urgent symptoms. For details visit [email or web address].   Also download the MyChart app! Go to the app store, search "MyChart", open the app, select [place], and log in with your MyChart username and password.

## 2024-02-22 ENCOUNTER — Ambulatory Visit: Payer: Self-pay | Admitting: Hematology & Oncology

## 2024-02-22 ENCOUNTER — Inpatient Hospital Stay

## 2024-02-22 DIAGNOSIS — Z5111 Encounter for antineoplastic chemotherapy: Secondary | ICD-10-CM | POA: Diagnosis not present

## 2024-02-22 DIAGNOSIS — D696 Thrombocytopenia, unspecified: Secondary | ICD-10-CM

## 2024-02-22 DIAGNOSIS — D4621 Refractory anemia with excess of blasts 1: Secondary | ICD-10-CM

## 2024-02-22 DIAGNOSIS — D46Z Other myelodysplastic syndromes: Secondary | ICD-10-CM

## 2024-02-22 DIAGNOSIS — D708 Other neutropenia: Secondary | ICD-10-CM

## 2024-02-22 DIAGNOSIS — D72810 Lymphocytopenia: Secondary | ICD-10-CM

## 2024-02-22 DIAGNOSIS — D709 Neutropenia, unspecified: Secondary | ICD-10-CM

## 2024-02-22 LAB — CBC WITH DIFFERENTIAL (CANCER CENTER ONLY)
Abs Immature Granulocytes: 0.02 K/uL (ref 0.00–0.07)
Basophils Absolute: 0 K/uL (ref 0.0–0.1)
Basophils Relative: 0 %
Eosinophils Absolute: 0 K/uL (ref 0.0–0.5)
Eosinophils Relative: 1 %
HCT: 30.5 % — ABNORMAL LOW (ref 39.0–52.0)
Hemoglobin: 10.7 g/dL — ABNORMAL LOW (ref 13.0–17.0)
Immature Granulocytes: 1 %
Lymphocytes Relative: 10 %
Lymphs Abs: 0.1 K/uL — ABNORMAL LOW (ref 0.7–4.0)
MCH: 34.7 pg — ABNORMAL HIGH (ref 26.0–34.0)
MCHC: 35.1 g/dL (ref 30.0–36.0)
MCV: 99 fL (ref 80.0–100.0)
Monocytes Absolute: 0.1 K/uL (ref 0.1–1.0)
Monocytes Relative: 8 %
Neutro Abs: 1.1 K/uL — ABNORMAL LOW (ref 1.7–7.7)
Neutrophils Relative %: 80 %
Platelet Count: 16 K/uL — ABNORMAL LOW (ref 150–400)
RBC: 3.08 MIL/uL — ABNORMAL LOW (ref 4.22–5.81)
RDW: 15.7 % — ABNORMAL HIGH (ref 11.5–15.5)
WBC Count: 1.4 K/uL — ABNORMAL LOW (ref 4.0–10.5)
nRBC: 0 % (ref 0.0–0.2)

## 2024-02-22 LAB — CMP (CANCER CENTER ONLY)
ALT: 19 U/L (ref 0–44)
AST: 18 U/L (ref 15–41)
Albumin: 4.2 g/dL (ref 3.5–5.0)
Alkaline Phosphatase: 71 U/L (ref 38–126)
Anion gap: 8 (ref 5–15)
BUN: 20 mg/dL (ref 8–23)
CO2: 26 mmol/L (ref 22–32)
Calcium: 9.6 mg/dL (ref 8.9–10.3)
Chloride: 104 mmol/L (ref 98–111)
Creatinine: 1.08 mg/dL (ref 0.61–1.24)
GFR, Estimated: >60 mL/min
Glucose, Bld: 158 mg/dL — ABNORMAL HIGH (ref 70–99)
Potassium: 4.7 mmol/L (ref 3.5–5.1)
Sodium: 139 mmol/L (ref 135–145)
Total Bilirubin: 0.5 mg/dL (ref 0.0–1.2)
Total Protein: 5.8 g/dL — ABNORMAL LOW (ref 6.5–8.1)

## 2024-02-22 LAB — URIC ACID: Uric Acid, Serum: 3.5 mg/dL — ABNORMAL LOW (ref 3.7–8.6)

## 2024-02-22 LAB — PROTIME-INR
INR: 1 (ref 0.8–1.2)
Prothrombin Time: 13.7 s (ref 11.4–15.2)

## 2024-02-22 LAB — PHOSPHORUS: Phosphorus: 2.7 mg/dL (ref 2.5–4.6)

## 2024-02-22 LAB — LACTATE DEHYDROGENASE: LDH: 249 U/L — ABNORMAL HIGH (ref 105–235)

## 2024-02-22 LAB — SAMPLE TO BLOOD BANK

## 2024-02-22 LAB — MAGNESIUM: Magnesium: 2.1 mg/dL (ref 1.7–2.4)

## 2024-02-22 LAB — FIBRINOGEN: Fibrinogen: 335 mg/dL (ref 210–475)

## 2024-02-22 LAB — D-DIMER, QUANTITATIVE: D-Dimer, Quant: 0.88 ug{FEU}/mL — ABNORMAL HIGH (ref 0.00–0.50)

## 2024-02-22 LAB — APTT: aPTT: 34 s (ref 24–36)

## 2024-02-22 NOTE — Patient Instructions (Signed)
 [facility] - A DEPT OF [facility]  Discharge Instructions: Thank you for choosing [facility] to provide your oncology and hematology care.   If you have a lab appointment with the Cancer Center, please go directly to the Cancer Center and check in at the registration area.  Wear comfortable clothing and clothing appropriate for easy access to any Portacath or PICC line.   We strive to give you quality time with your provider. You may need to reschedule your appointment if you arrive late (15 or more minutes).  Arriving late affects you and other patients whose appointments are after yours.  Also, if you miss three or more appointments without notifying the office, you may be dismissed from the clinic at the provider's discretion.      For prescription refill requests, have your pharmacy contact our office and allow 72 hours for refills to be completed.    Today you received the following chemotherapy and/or immunotherapy agents Dacogen      To help prevent nausea and vomiting after your treatment, we encourage you to take your nausea medication as directed.  BELOW ARE SYMPTOMS THAT SHOULD BE REPORTED IMMEDIATELY: *FEVER GREATER THAN 100.4 F (38 C) OR HIGHER *CHILLS OR SWEATING *NAUSEA AND VOMITING THAT IS NOT CONTROLLED WITH YOUR NAUSEA MEDICATION *UNUSUAL SHORTNESS OF BREATH *UNUSUAL BRUISING OR BLEEDING *URINARY PROBLEMS (pain or burning when urinating, or frequent urination) *BOWEL PROBLEMS (unusual diarrhea, constipation, pain near the anus) TENDERNESS IN MOUTH AND THROAT WITH OR WITHOUT PRESENCE OF ULCERS (sore throat, sores in mouth, or a toothache) UNUSUAL RASH, SWELLING OR PAIN  UNUSUAL VAGINAL DISCHARGE OR ITCHING   Items with * indicate a potential emergency and should be followed up as soon as possible or go to the Emergency Department if any problems should occur.  Please show the CHEMOTHERAPY ALERT CARD or IMMUNOTHERAPY  ALERT CARD at check-in to the Emergency Department and triage nurse. Should you have questions after your visit or need to cancel or reschedule your appointment, please contact [facility] - A DEPT OF [facility]  [phone] and follow the prompts.  Office hours are 8:00 a.m. to 4:30 p.m. Monday - Friday. Please note that voicemails left after 4:00 p.m. may not be returned until the following business day.  We are closed weekends and major holidays. You have access to a nurse at all times for urgent questions. Please call the main number to the clinic [phone] and follow the prompts.  For any non-urgent questions, you may also contact your provider using MyChart. We now offer e-Visits for anyone 1 and older to request care online for non-urgent symptoms. For details visit [email or web address].   Also download the MyChart app! Go to the app store, search "MyChart", open the app, select [place], and log in with your MyChart username and password.

## 2024-02-24 ENCOUNTER — Ambulatory Visit (HOSPITAL_BASED_OUTPATIENT_CLINIC_OR_DEPARTMENT_OTHER)
Admission: RE | Admit: 2024-02-24 | Discharge: 2024-02-24 | Disposition: A | Discharge status: Home / Self Care | Source: Ambulatory Visit | Attending: Hematology & Oncology | Admitting: Hematology & Oncology

## 2024-02-24 ENCOUNTER — Inpatient Hospital Stay

## 2024-02-24 ENCOUNTER — Other Ambulatory Visit: Payer: Self-pay | Admitting: Oncology

## 2024-02-24 ENCOUNTER — Other Ambulatory Visit: Payer: Self-pay | Admitting: Hematology & Oncology

## 2024-02-24 VITALS — BP 138/81 | HR 61

## 2024-02-24 DIAGNOSIS — C9201 Acute myeloblastic leukemia, in remission: Secondary | ICD-10-CM

## 2024-02-24 DIAGNOSIS — Z5111 Encounter for antineoplastic chemotherapy: Secondary | ICD-10-CM | POA: Diagnosis not present

## 2024-02-24 LAB — CBC WITH DIFFERENTIAL (CANCER CENTER ONLY)
Abs Immature Granulocytes: 0.02 K/uL (ref 0.00–0.07)
Basophils Absolute: 0 K/uL (ref 0.0–0.1)
Basophils Relative: 0 %
Eosinophils Absolute: 0 K/uL (ref 0.0–0.5)
Eosinophils Relative: 1 %
HCT: 29.4 % — ABNORMAL LOW (ref 39.0–52.0)
Hemoglobin: 10.3 g/dL — ABNORMAL LOW (ref 13.0–17.0)
Immature Granulocytes: 1 %
Lymphocytes Relative: 9 %
Lymphs Abs: 0.1 K/uL — ABNORMAL LOW (ref 0.7–4.0)
MCH: 34.6 pg — ABNORMAL HIGH (ref 26.0–34.0)
MCHC: 35 g/dL (ref 30.0–36.0)
MCV: 98.7 fL (ref 80.0–100.0)
Monocytes Absolute: 0.1 K/uL (ref 0.1–1.0)
Monocytes Relative: 7 %
Neutro Abs: 1.3 K/uL — ABNORMAL LOW (ref 1.7–7.7)
Neutrophils Relative %: 82 %
Platelet Count: 12 K/uL — ABNORMAL LOW (ref 150–400)
RBC: 2.98 MIL/uL — ABNORMAL LOW (ref 4.22–5.81)
RDW: 15.9 % — ABNORMAL HIGH (ref 11.5–15.5)
WBC Count: 1.6 K/uL — ABNORMAL LOW (ref 4.0–10.5)
nRBC: 0 % (ref 0.0–0.2)

## 2024-02-24 LAB — CMP (CANCER CENTER ONLY)
ALT: 20 U/L (ref 0–44)
AST: 16 U/L (ref 15–41)
Albumin: 4.3 g/dL (ref 3.5–5.0)
Alkaline Phosphatase: 73 U/L (ref 38–126)
Anion gap: 9 (ref 5–15)
BUN: 16 mg/dL (ref 8–23)
CO2: 26 mmol/L (ref 22–32)
Calcium: 9.5 mg/dL (ref 8.9–10.3)
Chloride: 106 mmol/L (ref 98–111)
Creatinine: 0.95 mg/dL (ref 0.61–1.24)
GFR, Estimated: >60 mL/min
Glucose, Bld: 118 mg/dL — ABNORMAL HIGH (ref 70–99)
Potassium: 4.5 mmol/L (ref 3.5–5.1)
Sodium: 140 mmol/L (ref 135–145)
Total Bilirubin: 0.5 mg/dL (ref 0.0–1.2)
Total Protein: 6 g/dL — ABNORMAL LOW (ref 6.5–8.1)

## 2024-02-24 MED ORDER — TRAMADOL HCL 50 MG PO TABS: ORAL_TABLET | ORAL | 0 refills | Status: AC

## 2024-02-24 NOTE — Progress Notes (Signed)
 Patient in for labs. Patient complains of right sided upper back pain. Patient states the pain keeps him from sleeping at night. Dr. Timmy notified. Orders placed for x ray and prescription for Tramadol  routed to Dr. Timmy. Patient verbalized understanding to the above mentioned plain.

## 2024-02-29 ENCOUNTER — Inpatient Hospital Stay

## 2024-02-29 ENCOUNTER — Inpatient Hospital Stay: Attending: Hematology & Oncology

## 2024-02-29 ENCOUNTER — Other Ambulatory Visit: Payer: Self-pay | Admitting: Hematology & Oncology

## 2024-02-29 DIAGNOSIS — Z5111 Encounter for antineoplastic chemotherapy: Secondary | ICD-10-CM | POA: Insufficient documentation

## 2024-02-29 DIAGNOSIS — Z5189 Encounter for other specified aftercare: Secondary | ICD-10-CM | POA: Diagnosis not present

## 2024-02-29 DIAGNOSIS — D696 Thrombocytopenia, unspecified: Secondary | ICD-10-CM

## 2024-02-29 DIAGNOSIS — Z452 Encounter for adjustment and management of vascular access device: Secondary | ICD-10-CM | POA: Diagnosis not present

## 2024-02-29 DIAGNOSIS — Z79631 Long term (current) use of antimetabolite agent: Secondary | ICD-10-CM | POA: Insufficient documentation

## 2024-02-29 DIAGNOSIS — D708 Other neutropenia: Secondary | ICD-10-CM

## 2024-02-29 DIAGNOSIS — C9201 Acute myeloblastic leukemia, in remission: Secondary | ICD-10-CM

## 2024-02-29 DIAGNOSIS — D4621 Refractory anemia with excess of blasts 1: Secondary | ICD-10-CM

## 2024-02-29 DIAGNOSIS — C9200 Acute myeloblastic leukemia, not having achieved remission: Secondary | ICD-10-CM | POA: Diagnosis present

## 2024-02-29 DIAGNOSIS — D709 Neutropenia, unspecified: Secondary | ICD-10-CM

## 2024-02-29 DIAGNOSIS — D46Z Other myelodysplastic syndromes: Secondary | ICD-10-CM

## 2024-02-29 DIAGNOSIS — D72810 Lymphocytopenia: Secondary | ICD-10-CM

## 2024-02-29 LAB — PROTIME-INR
INR: 1 (ref 0.8–1.2)
Prothrombin Time: 13.9 s (ref 11.4–15.2)

## 2024-02-29 LAB — CBC WITH DIFFERENTIAL (CANCER CENTER ONLY)
Abs Immature Granulocytes: 0 K/uL (ref 0.00–0.07)
Basophils Absolute: 0 K/uL (ref 0.0–0.1)
Basophils Relative: 2 %
Eosinophils Absolute: 0 K/uL (ref 0.0–0.5)
Eosinophils Relative: 5 %
HCT: 30.4 % — ABNORMAL LOW (ref 39.0–52.0)
Hemoglobin: 10.6 g/dL — ABNORMAL LOW (ref 13.0–17.0)
Immature Granulocytes: 0 %
Lymphocytes Relative: 21 %
Lymphs Abs: 0.1 K/uL — ABNORMAL LOW (ref 0.7–4.0)
MCH: 34.9 pg — ABNORMAL HIGH (ref 26.0–34.0)
MCHC: 34.9 g/dL (ref 30.0–36.0)
MCV: 100 fL (ref 80.0–100.0)
Monocytes Absolute: 0 K/uL — ABNORMAL LOW (ref 0.1–1.0)
Monocytes Relative: 9 %
Neutro Abs: 0.3 K/uL — CL (ref 1.7–7.7)
Neutrophils Relative %: 63 %
Platelet Count: 24 K/uL — ABNORMAL LOW (ref 150–400)
RBC: 3.04 MIL/uL — ABNORMAL LOW (ref 4.22–5.81)
RDW: 16.5 % — ABNORMAL HIGH (ref 11.5–15.5)
WBC Count: 0.4 K/uL — CL (ref 4.0–10.5)
nRBC: 0 % (ref 0.0–0.2)

## 2024-02-29 LAB — CMP (CANCER CENTER ONLY)
ALT: 19 U/L (ref 0–44)
AST: 18 U/L (ref 15–41)
Albumin: 4.4 g/dL (ref 3.5–5.0)
Alkaline Phosphatase: 72 U/L (ref 38–126)
Anion gap: 9 (ref 5–15)
BUN: 16 mg/dL (ref 8–23)
CO2: 26 mmol/L (ref 22–32)
Calcium: 9.8 mg/dL (ref 8.9–10.3)
Chloride: 108 mmol/L (ref 98–111)
Creatinine: 1.07 mg/dL (ref 0.61–1.24)
GFR, Estimated: >60 mL/min
Glucose, Bld: 117 mg/dL — ABNORMAL HIGH (ref 70–99)
Potassium: 4.4 mmol/L (ref 3.5–5.1)
Sodium: 143 mmol/L (ref 135–145)
Total Bilirubin: 0.7 mg/dL (ref 0.0–1.2)
Total Protein: 6.3 g/dL — ABNORMAL LOW (ref 6.5–8.1)

## 2024-02-29 LAB — URIC ACID: Uric Acid, Serum: 3.9 mg/dL (ref 3.7–8.6)

## 2024-02-29 LAB — FIBRINOGEN: Fibrinogen: 399 mg/dL (ref 210–475)

## 2024-02-29 LAB — APTT: aPTT: 33 s (ref 24–36)

## 2024-02-29 LAB — MAGNESIUM: Magnesium: 2.2 mg/dL (ref 1.7–2.4)

## 2024-02-29 LAB — SAMPLE TO BLOOD BANK

## 2024-02-29 LAB — PHOSPHORUS: Phosphorus: 3 mg/dL (ref 2.5–4.6)

## 2024-02-29 LAB — D-DIMER, QUANTITATIVE: D-Dimer, Quant: 1.3 ug{FEU}/mL — ABNORMAL HIGH (ref 0.00–0.50)

## 2024-02-29 LAB — LACTATE DEHYDROGENASE: LDH: 237 U/L — ABNORMAL HIGH (ref 105–235)

## 2024-02-29 NOTE — Progress Notes (Signed)
 Critical result received from lab of WBC 0.4 Dr. Timmy aware and no new orders received.

## 2024-03-01 ENCOUNTER — Encounter: Payer: Self-pay | Admitting: Hematology & Oncology

## 2024-03-03 ENCOUNTER — Inpatient Hospital Stay

## 2024-03-03 ENCOUNTER — Telehealth: Payer: Self-pay

## 2024-03-03 DIAGNOSIS — C9201 Acute myeloblastic leukemia, in remission: Secondary | ICD-10-CM

## 2024-03-03 DIAGNOSIS — Z5111 Encounter for antineoplastic chemotherapy: Secondary | ICD-10-CM | POA: Diagnosis not present

## 2024-03-03 LAB — CBC WITH DIFFERENTIAL (CANCER CENTER ONLY)
Abs Immature Granulocytes: 0.02 K/uL (ref 0.00–0.07)
Basophils Absolute: 0 K/uL (ref 0.0–0.1)
Basophils Relative: 2 %
Eosinophils Absolute: 0 K/uL (ref 0.0–0.5)
Eosinophils Relative: 2 %
HCT: 28.8 % — ABNORMAL LOW (ref 39.0–52.0)
Hemoglobin: 10 g/dL — ABNORMAL LOW (ref 13.0–17.0)
Immature Granulocytes: 4 %
Lymphocytes Relative: 22 %
Lymphs Abs: 0.1 K/uL — ABNORMAL LOW (ref 0.7–4.0)
MCH: 34.5 pg — ABNORMAL HIGH (ref 26.0–34.0)
MCHC: 34.7 g/dL (ref 30.0–36.0)
MCV: 99.3 fL (ref 80.0–100.0)
Monocytes Absolute: 0 K/uL — ABNORMAL LOW (ref 0.1–1.0)
Monocytes Relative: 6 %
Neutro Abs: 0.3 K/uL — CL (ref 1.7–7.7)
Neutrophils Relative %: 64 %
Platelet Count: 42 K/uL — ABNORMAL LOW (ref 150–400)
RBC: 2.9 MIL/uL — ABNORMAL LOW (ref 4.22–5.81)
RDW: 16.9 % — ABNORMAL HIGH (ref 11.5–15.5)
WBC Count: 0.5 K/uL — CL (ref 4.0–10.5)
nRBC: 0 % (ref 0.0–0.2)

## 2024-03-03 LAB — CMP (CANCER CENTER ONLY)
ALT: 15 U/L (ref 0–44)
AST: 15 U/L (ref 15–41)
Albumin: 4.2 g/dL (ref 3.5–5.0)
Alkaline Phosphatase: 71 U/L (ref 38–126)
Anion gap: 10 (ref 5–15)
BUN: 19 mg/dL (ref 8–23)
CO2: 22 mmol/L (ref 22–32)
Calcium: 9.5 mg/dL (ref 8.9–10.3)
Chloride: 107 mmol/L (ref 98–111)
Creatinine: 1.06 mg/dL (ref 0.61–1.24)
GFR, Estimated: >60 mL/min
Glucose, Bld: 169 mg/dL — ABNORMAL HIGH (ref 70–99)
Potassium: 4.2 mmol/L (ref 3.5–5.1)
Sodium: 139 mmol/L (ref 135–145)
Total Bilirubin: 0.6 mg/dL (ref 0.0–1.2)
Total Protein: 6 g/dL — ABNORMAL LOW (ref 6.5–8.1)

## 2024-03-03 NOTE — Telephone Encounter (Signed)
 Chris from ED lab called and stated that pt had a critical WBC of 0.5. Dr Timmy aware and said no changes at this time.

## 2024-03-03 NOTE — Patient Instructions (Signed)
 PICC Home Care Guide A peripherally inserted central catheter (PICC) is a form of IV access that allows medicines and IV fluids to be quickly put into the blood and spread throughout the body. The PICC is a long, thin, flexible tube (catheter) that is put into a vein in a person's arm or leg. The catheter ends in a large vein just outside the heart called the superior vena cava (SVC). After the PICC is put in, a chest X-ray may be done to make sure that it is in the right place. A PICC may be placed for different reasons, such as: To give medicines and liquid nutrition. To give IV fluids and blood products. To take blood samples often. If there is trouble placing a peripheral intravenous (PIV) catheter. If cared for properly, a PICC can remain in place for many months. Having a PICC can allow you to go home from the hospital sooner and continue treatment at home. Medicines and PICC care can be managed at home by a family member, caregiver, or home health care team. What are the risks? Generally, having a PICC is safe. However, problems may occur, including: A blood clot (thrombus) forming in or at the end of the PICC. A blood clot forming in a vein (deep vein thrombosis) or traveling to the lung (pulmonary embolism). Inflammation of the vein (phlebitis) in which the PICC is placed. Infection at the insertion site or in the blood. Blood infections from central lines, like PICCs, can be serious and often require a hospital stay. PICC malposition, or PICC movement or poor placement. A break or cut in the PICC. Do not use scissors near the PICC. Nerve or tendon irritation or injury during PICC insertion. How to care for your PICC Please follow the specific guidelines provided by your health care provider. Preventing infection You and any caregivers should wash your hands often with soap and water for at least 20 seconds. Wash hands: Before touching the PICC or the infusion device. Before changing a  bandage (dressing). Do not change the dressing unless you have been taught to do so and have shown you are able to change it safely. Flush the PICC as told. Tell your health care provider right away if the PICC is hard to flush or does not flush. Do not use force to flush the PICC. Use clean and germ-free (sterile) supplies only. Keep the supplies in a dry place. Do not reuse needles, syringes, or any other supplies. Reusing supplies can lead to infection. Keep the PICC dressing dry and secure it with tape if the edges stop sticking to your skin. Check your PICC insertion site every day for signs of infection. Check for: Redness, swelling, or pain. Fluid or blood. Warmth. Pus or a bad smell. Preventing other problems Do not use a syringe that is less than 10 mL to flush the PICC. Do not have your blood pressure checked on the arm in which the PICC is placed. Do not ever pull or tug on the PICC. Keep it secured to your arm with tape or a stretch wrap when not in use. Do not take the PICC out yourself. Only a trained health care provider should remove the PICC. Keep pets and children away from your PICC. How to care for your PICC dressing Keep your PICC dressing clean and dry to prevent infection. Do not take baths, swim, or use a hot tub until your health care provider approves. Ask your health care provider if you can take  showers. You may only be allowed to take sponge baths. When you are allowed to shower: Ask your health care provider to teach you how to wrap the PICC. Cover the PICC with clear plastic wrap and tape to keep it dry while showering. Follow instructions from your health care provider about how to take care of your insertion site and dressing. Make sure you: Wash your hands with soap and water for at least 20 seconds before and after you change your dressing. If soap and water are not available, use hand sanitizer. Change your dressing only if taught to do so by your health care  provider. Your PICC dressing needs to be changed if it becomes loose or wet. Leave stitches (sutures), skin glue, or adhesive strips in place. These skin closures may need to stay in place for 2 weeks or longer. If adhesive strip edges start to loosen and curl up, you may trim the loose edges. Do not remove adhesive strips completely unless your health care provider tells you to do that. Follow these instructions at home: Disposal of supplies Throw away any syringes in a disposal container that is meant for sharp items (sharps container). You can buy a sharps container from a pharmacy, or you can make one by using an empty, hard plastic bottle with a lid. Place any used dressings or infusion bags into a plastic bag. Throw that bag in the trash. General instructions  Always carry your PICC identification card or wear a medical alert bracelet. Keep the tube clamped at all times, unless it is being used. Always carry a smooth-edge clamp with you to clamp the PICC if it breaks. Do not use scissors or sharp objects near the tube. You may bend your arm and move it freely. If your PICC is near or at the bend of your elbow, avoid activity with repeated motion at the elbow. Avoid lifting heavy objects as told by your health care provider. Keep all follow-up visits. This is important. You will need to have your PICC dressing changed at least once a week. Contact a health care provider if: You have pain in your arm, ear, face, or teeth. You have a fever or chills. You have redness, swelling, or pain around the insertion site. You have fluid or blood coming from the insertion site. Your insertion site feels warm to the touch. You have pus or a bad smell coming from the insertion site. Your skin feels hard and raised around the insertion site. Your PICC dressing has gotten wet or is coming off and you have not been taught how to change it. Get help right away if: You have problems with your PICC, such as  your PICC: Was tugged or pulled and has partially come out. Do not  push the PICC back in. Cannot be flushed, is hard to flush, or leaks around the insertion site when it is flushed. Makes a flushing sound when it is flushed. Appears to have a hole or tear. Is accidentally pulled all the way out. If this happens, cover the insertion site with a gauze dressing. Do not throw the PICC away. Your health care provider will need to check it to be sure the entire catheter came out. You feel your heart racing or skipping beats, or you have chest pain. You have shortness of breath or trouble breathing. You have swelling, redness, warmth, or pain in the arm in which the PICC is placed. You have a red streak going up your arm that  starts under the PICC dressing. These symptoms may be an emergency. Get help right away. Call 911. Do not wait to see if the symptoms will go away. Do not drive yourself to the hospital. Summary A peripherally inserted central catheter (PICC) is a long, thin, flexible tube (catheter) that is put into a vein in the arm or leg. If cared for properly, a PICC can remain in place for many months. Having a PICC can allow you to go home from the hospital sooner and continue treatment at home. The PICC is inserted using a germ-free (sterile) technique by a specially trained health care provider. Only a trained health care provider should remove it. Do not have your blood pressure checked on the arm in which your PICC is placed. Always keep your PICC identification card with you. This information is not intended to replace advice given to you by your health care provider. Make sure you discuss any questions you have with your health care provider. Document Revised: 08/29/2020 Document Reviewed: 08/29/2020 Elsevier Patient Education  2024 [employer].

## 2024-03-07 ENCOUNTER — Inpatient Hospital Stay

## 2024-03-07 ENCOUNTER — Ambulatory Visit: Payer: Self-pay | Admitting: Hematology & Oncology

## 2024-03-07 NOTE — Progress Notes (Signed)
 Right PICC dressing changed per protocol.  Needless connector changed x2. Lumens flushing with ease and brisk blood return noted x2. Pt tolerated well.

## 2024-03-07 NOTE — Procedures (Signed)
" °   °  Procedure:  Bone Marrow Aspiration/Biopsy  Date/Time: 03/07/2024 9:34 AM   Provider Information:  Performed by: Erle Bosie DASEN, PA-C Authorized by: Gwenette Thersia BROCKS, PA   Assistant present: yes Assistant: Mindi Pollen, RT Medical interpreter used?: interpreter not needed  Patient Diagnosis: Pre-procedure diagnosis: AML Post-procedure diagnosis: unchanged  Indication: Indication: evaluation of disease status  Anesthesia: Anesthesia: local infiltration Patient anesthetized by: advanced practice provider Local anesthetic: lidocaine  1% without epinephrine  Anesthetic total (ml): 20  Sedation: Patient sedated?: patient not sedated   Aspirate Site(s): Laterality: right Site location: posterior iliac crest Instrument(s) used: [place]  needle Instruments placed by: advanced practice provider  Biopsy Site(s): Laterality: right Site location: posterior iliac crest Instrument(s) used: [place] needle Instruments placed by: advanced practice provider  Dressing: Dressing: compression bandage  Post-Procedure Patient Assessment: Patient tolerance: well Estimated blood loss: none Complications/Observations: no complications Greater than 20ccs of Lidocaine  given?: No Post-procedure pain scale: 0/10  Discharge/Disposition: Discharge instructions: verbal and patient verbalized understanding Patient discharged to: discharge to home Disposition mode: ambulatory   Sample Disposition: Testing performed: flow cytometry and cytogenetics Research samples(s): no Aspirate volume obtained (mL) - right: 12 Visual assessment for aspirate specimen adequacy - right: particles Visual assessment for biopsy specimen adequacy (cm) - right: 1.1 Biopsy specimen integrity - right: fragmented Specimen cross-checked with assistant: yes Comments: LABELS CHECKED BY SHAY / SIMI  APP BMBX Procedure  The universal protocol was performed. The patient was informed of the risks and  consented for the procedure.  He tolerated the procedure well without immediate complications. He verbalized understanding to keep the dressing dry and intact for 48 hours and remove it afterward. He was discharged in a stable condition and ambulatory.  Specimen labeled correctly by the [facility] technician.     "

## 2024-03-07 NOTE — Progress Notes (Signed)
 [facility] FA[facility] CLINIC NOTE  Patient: Patrick [name] Age: 84 y.o.  MRN: [id number] Attending: Gwenette Thersia BROCKS, PA   Date of Visit: March 07, 2024  Reason for Visit: Medically necessary follow up and review of laboratory results in a patient with a hematologic malignancy  Current Therapy:  Decitabine  + venetoclax  with local oncologist, last cycle started 02/08/24  Subjective  Patrick [name] is a 84 y.o. male who presents today in the [facility] with a diagnosis of AML. He is feeing well today, here for workup after completing 2 cycles of decitabine  + venetoclax  with local oncologist. He reports cycles went well. He does report a small collection of fluid to right shin area. Reports he had an X-Ray and CT and it was thought to be a benign hematoma, results in Epic. It does not cause any pain, has remained stable in size. He reports he needs a refill of Lovenox . He denies fevers, chills, spontaneous bleeding, cough, chest pain, SOB, and nausea/vomiting/diarrhea.   The remaining review of systems is negative.  Objective  Wt 70.8 kg (156 lb 1.4 oz)   Ht 181 cm (5' 11.26) (10/09/2023  3:54 PM)   BP 126/77   Pulse 67   Temp 36.5 C (97.7 F) (Oral)   Resp 18   SpO2 99%   Pertinent Physical Exam: GENERAL: Alert & oriented x 3. Patient is in no acute distress. Patient ambulates without assistance. and presents to clinic unaccompanied. HEENT: Normocephalic, atraumatic.  RESPIRATORY: No distress on room air.  EXTREMITIES: Full ROM in all extremities. No edema. SKIN: No erythema, rashes, or signs of infection.  NEUROLOGIC: No focal deficits.  PSYCHIATRIC: Appropriate affect and mood.  The remaining physical exam is deferred.  Current Outpatient Medications  Medication   levoFLOXacin    posaconazole    valACYclovir    amLODIPine    atorvastatin    diphenoxylate -atropine    enoxaparin    ondansetron    pantoprazole    rOPINIRole    sodium chloride     SUMAtriptan    tadalafil    venetoclax    No current facility-administered medications for this visit.   Medications reviewed with patient and updated.   Laboratory Data: Results from last 7 days  Lab Units 03/07/24 0653  WBC K/uL 0.6*  HEMOGLOBIN g/dL 88.7*  HEMATOCRIT % 67.0*  PLATELET COUNT K/uL 102*   Lab Results  Component Value Date   ANC 0.31 (L) 03/07/2024   BLASTS 3.0 (H) 11/04/2023    Results from last 7 days  Lab Units 03/07/24 0653  SODIUM LVL mmol/L 141  POTASSIUM LVL mmol/L 4.2  CHLORIDE mmol/L  106  CARBON DIOXIDE mmol/L  26  BLOOD UREA NITROGEN mg/dL 17  CREATININE mg/dL 8.83  GLUCOSE LEVEL mg/dL 883*  CALCIUM  LEVEL TOTAL mg/dL 89.9    Assessment & Plan  AML: Continue above treatment plan. BMA/bx today. Aware of next follow up with Dr. Laverna scheduled for 03/09/24.   2. Anemia: Hgb 11.2, no indications for transfusion.    3. Thrombocytopenia: Platelets 102, no indications for transfusion.   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 Continue antimicrobial ppx with Levaquin , Valtrex , and Noxafil .  The patient is aware of the indications to report to the [facility] including fever, chills, uncontrolled bleeding, or any other issues requiring urgent attention.  I am billing this visit based on time. The total time I spent on the date of the visit, including time with the patient and/or patient's family, preparing for the visit, completing documentation, and coordinating  care was 20 minutes.   Ashlin Charboneau, NP-C Nurse Practitioner Department of Leukemia [facility]

## 2024-03-09 ENCOUNTER — Telehealth: Payer: Self-pay

## 2024-03-09 ENCOUNTER — Other Ambulatory Visit: Payer: Self-pay

## 2024-03-09 ENCOUNTER — Other Ambulatory Visit: Payer: Self-pay | Admitting: Hematology & Oncology

## 2024-03-09 DIAGNOSIS — D4621 Refractory anemia with excess of blasts 1: Secondary | ICD-10-CM

## 2024-03-09 DIAGNOSIS — C9201 Acute myeloblastic leukemia, in remission: Secondary | ICD-10-CM

## 2024-03-09 DIAGNOSIS — D46Z Other myelodysplastic syndromes: Secondary | ICD-10-CM

## 2024-03-09 MED ORDER — VENETOCLAX 100 MG PO TABS
100.0000 mg | ORAL_TABLET | Freq: Every day | ORAL | 2 refills | Status: DC
  Filled 2024-03-09: qty 120, 120d supply, fill #0
  Filled 2024-03-10: qty 3, 3d supply, fill #0

## 2024-03-09 MED ORDER — VENETOCLAX 100 MG PO TABS: 100.0000 mg | ORAL_TABLET | Freq: Every day | ORAL | 2 refills | Status: DC

## 2024-03-09 NOTE — Telephone Encounter (Signed)
 Dr. Timmy spoke with Dr. Laverna from [facility].  Pt to come in tomorrow 03/10/24 for labs (CBC, CMP and sample to blood bank) and if ANC less than 1.0 then received a GCSF injection. Pt to start next treatment cycle on Monday 03/14/24.  Scheduling and pharmacy aware. Pt states he won't be back in town until Sunday 03/13/2024 and unable to come in for labs and injection on 03/10/24. Dr. Timmy aware.

## 2024-03-09 NOTE — Progress Notes (Signed)
 ------------------------------------------------------------------------------- Attestation signed by Laverna Glory Punches, MD at 03/13/2024  4:21 PM I have seen and examined the patient in conjunction with the PA and have formulated the treatment plan.  Please refer to the associated note for more details.   This was a shared visit and I performed the substantive portion of the visit based on the entirety of MDM.   I have reviewed the relevant documentation in the record. The level of service was chosen based on MDM.       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  He was treated with 4 cycles of 5-azacytidine, had progressive disease in the form of multiple ongoing cytopenias and transfusion dependence.   We saw him in consultation, and a complete workup was done here confirming a diagnosis of progression and hypomethylating agent failure, with elevated blast up to 18%, consistent with diagnosis of MDS/AML.  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   The patient received therapy off protocol with cladribine  as low-dose cytarabine  plus venetoclax .  He did well in the hospital.  He had minimal complications, and achieved complete remission with recovery of blood counts.  He then received a second cycle of therapy locally.  He did well with this.  We saw him back after 2 cycles, showing complete response.  He then went on to receive 2 further cycles of therapy at home with decitabine  plus venetoclax .  He received 5 days of decitabine , and 7 days of venetoclax .  He did well with this, now comes back today for follow-up visit.  A complete review of systems was performed today further documented in the associated note.  The patient states that he feels okay.  He states that the chemotherapy started to wear him.   Does have some more fatigue, and a bit more fatigue on recovery.  He is doing okay today, but no other new complaints.  Performance status is 1.  Denies any fevers, chills, night sweats, nausea, vomiting, diarrhea, bleeding, bruising.   A complete physical exam was performed today, further documented in the associated note.  Lungs are clear, heart regular, abdomen benign.  Extremities are warm with no cyanosis clubbing edema.   Recent Labs    Units 03/07/24 0653 03/09/24 0855  White Blood Cell K/uL 0.6* 0.7*  Manual Neutrophil Abs K/uL 0.31* 0.36*  Hemoglobin g/dL 88.7* 88.6*  Platelet K/uL 102* 120*   Recent Labs    Units 03/07/24 0653 03/09/24 0855  Sodium Level mmol/L 141 140  Potassium Level mmol/L 4.2 4.1  Chloride mmol/L  106 106  CO2 mmol/L  26 28  BUN mg/dL 17 16  Creatinine mg/dL 8.83 8.86  Glucose Level mg/dL 883*  --   Glucose Random mg/dL  --  889  Calcium  Level Total mg/dL 89.9 9.9   Recent Labs    Units 03/07/24 0653 03/09/24 0855  ALT U/L 12 11  AST U/L 15 13  Alkaline Phosphatase U/L 85 89  Bilirubin Total mg/dL 0.6 0.4  LDH U/L 741* 761*             Assessment and Plan: This is an 84 year old gentleman from [place]  who we initially saw on 09/30/2023, with a past history significant for hypertension, coronary artery disease, mechanical aortic valve replacement, currently on Lovenox , who is being treated on the outside for  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  He was receiving single agent Vidaza  at home.  He has received 4 cycles of Vidaza .  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  The patient received chemotherapy protocol with cladribine  for 5 days, low-dose cytarabine  for 10 days, and venetoclax  for 14 days.  He achieved complete morphologic remission.  He then received cycle 2 at home recently.   1.  MDS/AML.  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  His initial bone marrow showed 18% blasts, mutations in TET2, and U2 AF-1.  Karyotype was diploid.   The patient is doing very well.  He is in complete remission.  His blood counts are recovering.  He is still not somewhat neutropenic.  He received 2 cycles of cladribine  plus low-dose of treatment plus venetoclax , and 2 cycles of decitabine  plus venetoclax .  The patient states that he is having trouble with the last few cycles of chemotherapy.  I had the opportunity speak with his daughter.  We decided to reduce his dose of decitabine  to 3 days of decitabine  combined with 3 days of venetoclax , as a maintenance.  His preliminary bone marrow shows no increased blasts.  Will await flow cytometry results.  The patient understands.  We discussed giving him a dose of Neupogen  here, but he would like to get this locally since yesterday catch a flight.  He will get 2 cycles at home, I spoke to his physician.  Will plan to see his back in about 9 weeks or earlier if needed.   He has bone marrow done recently here at [facility] shows complete morphologic remission with low-level disease only detectable by flow cytometry.  He is doing very well, plan now is to continue with consolidation therapy.  This will include 2 cycles of HMA plus venetoclax .  He has received Vidaza  in the past.   We recommend decitabine  20 mg/m daily for 5 days combined with venetoclax  daily for 5 to 7 days.  He received 2 cycles of therapy at home, plan to return to see us  in January 2026 for reevaluation,, and repeat bone marrow to assess response.   2.  Cardiac.  The patient has a diagnosis of hypertension, continues on amlodipine , doing well.  He has history of mechanical aortic valve and was on warfarin previously.  In the setting of impending thrombocytopenia with chemotherapy, he is continued on Lovenox , and is doing well overall.  He will continue to follow with his  local doctor for this.  His Lovenox  dose was reduced to 100 mg subcutaneously.   The patient had ample opportunity to ask questions today which were answered to their satisfaction. -------------------------------------------------------------------------------  LEUKEMIA CLINIC NOTE  Patient: Nazim Kadlec Age: 84 y.o.  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  Oct 02, 2023: Follow-up for AML with myelodysplasia-related changes; patient progressed from MDS (RAEB-1) after 4 cycles of azacitidine  to high-risk AML with 18-20% marrow blasts. Reported fatigue, recent transfusion needs, and significant cytopenias (WBC 0.4, Hgb 8.2, platelets 32,000). Treatment options discussed included venetoclax -based regimens and clinical trials; plan to return in 1 week for further evaluation and possible  chemotherapy initiation. Continued on Lovenox  for mechanical valve despite thrombocytopenia, and prophylactic antimicrobials.   He has completed 2 cycles of chemotherapy for AML, including cladribine , low-dose cytarabine , venetoclax . Cycle one was well tolerated, and his subsequent cycle was administered locally at home in [place] , with a regimen matching his course at [facility].     Bone marrow aspiration and biopsy on 12/28/2023 demonstrated morphologic remission with minimal residual disease (MRD 0.36% by flow cytometry). He has not required transfusions since returning home, except for one phosphorus infusion, per patient.    03/09/24: He returns today for follow-up. He reports feeling well today. He received two cycles of IV DAC x 5 days + Venetoclax  50 mg x 7 days locally. He is approximately day 31 today. He has blood work drawn twice  weekly and denies fevers, chills, sweats, abnormal bleeding, or bruising.    OBJECTIVE: BP 155/71   Pulse 67   Temp 36.6 C (97.9 F) (Oral)   Resp 17   Wt 72.6 kg (160 lb 0.9 oz)   SpO2 97%   BMI 22.16 kg/m   ECOG 1 GENERAL: Alert & oriented x 3. Patient is in no acute distress. Patient ambulates without assistance and presents to clinic. HEENT: Normocephalic, atraumatic.  CARDIOVASCULAR: Regular rate and rhythm RESPIRATORY: No distress on room air. Clear to auscultation bilaterally. No rales, rhonchi, or wheezing.    EXTREMITIES: Full ROM in all extremities. No edema. SKIN: No erythema, rashes, or signs of infection.  NEUROLOGIC: No focal deficits.  PSYCHIATRIC: Appropriate affect and mood.  The rest of the physical examination was deferred.   Medications were reviewed with the patient.  Medications: Current Outpatient Medications  Medication   amLODIPine    atorvastatin    diphenoxylate -atropine    enoxaparin    levoFLOXacin    ondansetron    pantoprazole    posaconazole    rOPINIRole    sodium chloride    SUMAtriptan    tadalafil    valACYclovir    venetoclax    No current facility-administered medications for this visit.   Facility-Administered Medications Ordered in Other Visits  Medication Dose Route Frequency   lidocaine   20 mL subcutaneous PRN    Laboratory Data: I have reviewed pertinent laboratory data, pathology reports, and imaging.  Lab Results  Component Value Date   WBC 0.7 (L) 03/09/2024   HGB 11.3 (L) 03/09/2024   HCT 33.2 (L) 03/09/2024   MCV 100 (H) 03/09/2024   PLT 120 (L) 03/09/2024   Lab Results  Component Value Date   GLUCLV 110 03/09/2024   CALCIUM  9.9 03/09/2024   SODIUMLVL 140 03/09/2024   POTASSIUML 4.1 03/09/2024   CARBDIOX 28 03/09/2024   CHLORIDE 106 03/09/2024   BUN 16 03/09/2024   CREATININE 1.13 03/09/2024    ASSESSMENT/PLAN:   MDS/AML: Patient has completed 4 cycles of therapy. He is approximately C4D31  today. Hgb and plts have recovered, but ANC remains low at 0.36. We recommend that he receive Neupogen  locally. He will return home to do three more cycles of DAC + Ven locally, but reduce to 3 days of DAC and 3 days of venetoclax . RTC on 4/15.   2. Immunosuppression: No concerns for infection today in clinic. Continue triple antimicrobial prophylaxis, patient currently taking Levaquin , posaconazole , and Valtrex . ANC today is 0.36.   The patient is aware of indications to return to the emergency room including fever 101F or greater, chills, spontaneous bleeding or any other symptom related to their disease requiring urgent medical attention.  This was a shared visit with Dr. Laverna. I spent  15 minutes of reportable time on the date of the visit, which does not include any overlapping time spent with the billing provider.   Dr. Laverna was present and participated in all aspects of this patient's care.   Please see his associated note for further details on today's visit and care plan. All questions were tended to and answered to the best of our ability.   The patient understands and is in agreement with current plan and recommendations. The patient was informed to contact the clinic if any futher questions arise.  Waddell Rimes, MPAS, PA-C Department of Leukemia  [employer]  [facility]

## 2024-03-10 ENCOUNTER — Other Ambulatory Visit (HOSPITAL_COMMUNITY): Payer: Self-pay

## 2024-03-10 ENCOUNTER — Encounter: Payer: Self-pay | Admitting: Hematology & Oncology

## 2024-03-10 ENCOUNTER — Other Ambulatory Visit: Payer: Self-pay

## 2024-03-10 NOTE — Progress Notes (Signed)
 Patient called back - he will not be able to pick up on Monday 1/19  due to infusion treatment and needs to start on Tuesday 1/20. Patient agreed to have medication mailed on 1/16 for delivery to home address on file on 1/19.

## 2024-03-10 NOTE — Progress Notes (Signed)
 Specialty Pharmacy Refill Coordination Note  MyChart Questionnaire Submission  Patrick [name] is a 84 y.o. male contacted today regarding refills of specialty medication(s) Venclexta .  Doses on hand: (Patient-Rptd) Not sure   Next inj: (Patient-Rptd) 03/14/24  Patient requested: (Patient-Rptd) Pickup at [facility] at [facility] date: 03/14/24  Medication will be filled on 03/11/24

## 2024-03-11 ENCOUNTER — Other Ambulatory Visit (HOSPITAL_COMMUNITY): Payer: Self-pay

## 2024-03-11 ENCOUNTER — Other Ambulatory Visit: Payer: Self-pay

## 2024-03-14 ENCOUNTER — Inpatient Hospital Stay

## 2024-03-14 VITALS — BP 126/71 | HR 58 | Temp 97.5°F | Resp 18

## 2024-03-14 DIAGNOSIS — Z5111 Encounter for antineoplastic chemotherapy: Secondary | ICD-10-CM | POA: Diagnosis not present

## 2024-03-14 DIAGNOSIS — D708 Other neutropenia: Secondary | ICD-10-CM

## 2024-03-14 DIAGNOSIS — D46Z Other myelodysplastic syndromes: Secondary | ICD-10-CM

## 2024-03-14 DIAGNOSIS — C9201 Acute myeloblastic leukemia, in remission: Secondary | ICD-10-CM

## 2024-03-14 DIAGNOSIS — D709 Neutropenia, unspecified: Secondary | ICD-10-CM

## 2024-03-14 DIAGNOSIS — D696 Thrombocytopenia, unspecified: Secondary | ICD-10-CM

## 2024-03-14 DIAGNOSIS — D72810 Lymphocytopenia: Secondary | ICD-10-CM

## 2024-03-14 DIAGNOSIS — D4621 Refractory anemia with excess of blasts 1: Secondary | ICD-10-CM

## 2024-03-14 LAB — CMP (CANCER CENTER ONLY)
ALT: 12 U/L (ref 0–44)
AST: 14 U/L — ABNORMAL LOW (ref 15–41)
Albumin: 4.3 g/dL (ref 3.5–5.0)
Alkaline Phosphatase: 83 U/L (ref 38–126)
Anion gap: 8 (ref 5–15)
BUN: 20 mg/dL (ref 8–23)
CO2: 28 mmol/L (ref 22–32)
Calcium: 9.8 mg/dL (ref 8.9–10.3)
Chloride: 107 mmol/L (ref 98–111)
Creatinine: 1.24 mg/dL (ref 0.61–1.24)
GFR, Estimated: 58 mL/min — ABNORMAL LOW
Glucose, Bld: 128 mg/dL — ABNORMAL HIGH (ref 70–99)
Potassium: 4.4 mmol/L (ref 3.5–5.1)
Sodium: 143 mmol/L (ref 135–145)
Total Bilirubin: 0.5 mg/dL (ref 0.0–1.2)
Total Protein: 6.3 g/dL — ABNORMAL LOW (ref 6.5–8.1)

## 2024-03-14 LAB — D-DIMER, QUANTITATIVE: D-Dimer, Quant: 0.85 ug{FEU}/mL — ABNORMAL HIGH (ref 0.00–0.50)

## 2024-03-14 LAB — CBC WITH DIFFERENTIAL (CANCER CENTER ONLY)
Abs Immature Granulocytes: 0.01 K/uL (ref 0.00–0.07)
Basophils Absolute: 0 K/uL (ref 0.0–0.1)
Basophils Relative: 2 %
Eosinophils Absolute: 0 K/uL (ref 0.0–0.5)
Eosinophils Relative: 2 %
HCT: 36 % — ABNORMAL LOW (ref 39.0–52.0)
Hemoglobin: 12 g/dL — ABNORMAL LOW (ref 13.0–17.0)
Immature Granulocytes: 1 %
Lymphocytes Relative: 15 %
Lymphs Abs: 0.2 K/uL — ABNORMAL LOW (ref 0.7–4.0)
MCH: 33.9 pg (ref 26.0–34.0)
MCHC: 33.3 g/dL (ref 30.0–36.0)
MCV: 101.7 fL — ABNORMAL HIGH (ref 80.0–100.0)
Monocytes Absolute: 0.3 K/uL (ref 0.1–1.0)
Monocytes Relative: 24 %
Neutro Abs: 0.8 K/uL — ABNORMAL LOW (ref 1.7–7.7)
Neutrophils Relative %: 56 %
Platelet Count: 127 K/uL — ABNORMAL LOW (ref 150–400)
RBC: 3.54 MIL/uL — ABNORMAL LOW (ref 4.22–5.81)
RDW: 16.3 % — ABNORMAL HIGH (ref 11.5–15.5)
WBC Count: 1.3 K/uL — ABNORMAL LOW (ref 4.0–10.5)
nRBC: 0 % (ref 0.0–0.2)

## 2024-03-14 LAB — LACTATE DEHYDROGENASE: LDH: 258 U/L — ABNORMAL HIGH (ref 105–235)

## 2024-03-14 LAB — MAGNESIUM: Magnesium: 2 mg/dL (ref 1.7–2.4)

## 2024-03-14 LAB — SAMPLE TO BLOOD BANK

## 2024-03-14 LAB — APTT: aPTT: 36 s (ref 24–36)

## 2024-03-14 LAB — PROTIME-INR
INR: 1.1 (ref 0.8–1.2)
Prothrombin Time: 14.4 s (ref 11.4–15.2)

## 2024-03-14 LAB — URIC ACID: Uric Acid, Serum: 4.4 mg/dL (ref 3.7–8.6)

## 2024-03-14 LAB — PHOSPHORUS: Phosphorus: 2.9 mg/dL (ref 2.5–4.6)

## 2024-03-14 LAB — FIBRINOGEN: Fibrinogen: 436 mg/dL (ref 210–475)

## 2024-03-14 MED ORDER — FILGRASTIM-SNDZ 480 MCG/0.8ML IJ SOSY
480.0000 ug | PREFILLED_SYRINGE | Freq: Once | INTRAMUSCULAR | Status: AC
  Administered 2024-03-14: 480 ug via SUBCUTANEOUS
  Filled 2024-03-14: qty 0.8

## 2024-03-14 NOTE — Patient Instructions (Signed)
 [facility] - A DEPT OF [facility]  Discharge Instructions: Thank you for choosing [facility] to provide your oncology and hematology care.   If you have a lab appointment with the Cancer Center, please go directly to the Cancer Center and check in at the registration area.  Wear comfortable clothing and clothing appropriate for easy access to any Portacath or PICC line.   We strive to give you quality time with your provider. You may need to reschedule your appointment if you arrive late (15 or more minutes).  Arriving late affects you and other patients whose appointments are after yours.  Also, if you miss three or more appointments without notifying the office, you may be dismissed from the clinic at the provider's discretion.      For prescription refill requests, have your pharmacy contact our office and allow 72 hours for refills to be completed.    Today you received the following chemotherapy and/or immunotherapy agents Dacogen      To help prevent nausea and vomiting after your treatment, we encourage you to take your nausea medication as directed.  BELOW ARE SYMPTOMS THAT SHOULD BE REPORTED IMMEDIATELY: *FEVER GREATER THAN 100.4 F (38 C) OR HIGHER *CHILLS OR SWEATING *NAUSEA AND VOMITING THAT IS NOT CONTROLLED WITH YOUR NAUSEA MEDICATION *UNUSUAL SHORTNESS OF BREATH *UNUSUAL BRUISING OR BLEEDING *URINARY PROBLEMS (pain or burning when urinating, or frequent urination) *BOWEL PROBLEMS (unusual diarrhea, constipation, pain near the anus) TENDERNESS IN MOUTH AND THROAT WITH OR WITHOUT PRESENCE OF ULCERS (sore throat, sores in mouth, or a toothache) UNUSUAL RASH, SWELLING OR PAIN  UNUSUAL VAGINAL DISCHARGE OR ITCHING   Items with * indicate a potential emergency and should be followed up as soon as possible or go to the Emergency Department if any problems should occur.  Please show the CHEMOTHERAPY ALERT CARD or IMMUNOTHERAPY  ALERT CARD at check-in to the Emergency Department and triage nurse. Should you have questions after your visit or need to cancel or reschedule your appointment, please contact [facility] - A DEPT OF [facility]  [phone] and follow the prompts.  Office hours are 8:00 a.m. to 4:30 p.m. Monday - Friday. Please note that voicemails left after 4:00 p.m. may not be returned until the following business day.  We are closed weekends and major holidays. You have access to a nurse at all times for urgent questions. Please call the main number to the clinic [phone] and follow the prompts.  For any non-urgent questions, you may also contact your provider using MyChart. We now offer e-Visits for anyone 1 and older to request care online for non-urgent symptoms. For details visit [email or web address].   Also download the MyChart app! Go to the app store, search "MyChart", open the app, select [place], and log in with your MyChart username and password.

## 2024-03-14 NOTE — Patient Instructions (Signed)
 Filgrastim Injection What is this medication? FILGRASTIM (fil GRA stim) lowers the risk of infection in people who are receiving chemotherapy. It works by Systems analyst make more white blood cells, which protects your body from infection. It may also be used to help people who have been exposed to high doses of radiation. It can be used to help prepare your body before a stem cell transplant. It works by helping your bone marrow make and release stem cells into the blood. This medicine may be used for other purposes; ask your health care provider or pharmacist if you have questions. COMMON BRAND NAME(S): Neupogen, Nivestym, Nypozi, Releuko, Zarxio What should I tell my care team before I take this medication? They need to know if you have any of these conditions: History of blood diseases, such as sickle cell anemia Kidney disease Recent or ongoing radiation An unusual or allergic reaction to filgrastim, pegfilgrastim, latex, rubber, other medications, foods, dyes, or preservatives Pregnant or trying to get pregnant Breast-feeding How should I use this medication? This medication is injected under the skin or into a vein. It is usually given by your care team in a hospital or clinic setting. It may be given at home. If you get this medication at home, you will be taught how to prepare and give it. Use exactly as directed. Take it as directed on the prescription label at the same time every day. Keep taking it unless your care team tells you to stop. It is important that you put your used needles and syringes in a special sharps container. Do not put them in a trash can. If you do not have a sharps container, call your pharmacist or care team to get one. This medication comes with INSTRUCTIONS FOR USE. Ask your pharmacist for directions on how to use this medication. Read the information carefully. Talk to your pharmacist or care team if you have questions. Talk to your care team about the use of  this medication in children. While it may be prescribed for children for selected conditions, precautions do apply. Overdosage: If you think you have taken too much of this medicine contact a poison control center or emergency room at once. NOTE: This medicine is only for you. Do not share this medicine with others. What if I miss a dose? It is important not to miss any doses. Talk to your care team about what to do if you miss a dose. What may interact with this medication? Medications that may cause a release of neutrophils, such as lithium This list may not describe all possible interactions. Give your health care provider a list of all the medicines, herbs, non-prescription drugs, or dietary supplements you use. Also tell them if you smoke, drink alcohol, or use illegal drugs. Some items may interact with your medicine. What should I watch for while using this medication? Your condition will be monitored carefully while you are receiving this medication. You may need bloodwork while taking this medication. Talk to your care team about your risk of cancer. You may be more at risk for certain types of cancer if you take this medication. What side effects may I notice from receiving this medication? Side effects that you should report to your care team as soon as possible: Allergic reactions--skin rash, itching, hives, swelling of the face, lips, tongue, or throat Capillary leak syndrome--stomach or muscle pain, unusual weakness or fatigue, feeling faint or lightheaded, decrease in the amount of urine, swelling of the ankles, hands,  or feet, trouble breathing High white blood cell level--fever, fatigue, trouble breathing, night sweats, change in vision, weight loss Inflammation of the aorta--fever, fatigue, back, chest, or stomach pain, severe headache Kidney injury (glomerulonephritis)--decrease in the amount of urine, red or dark brown urine, foamy or bubbly urine, swelling of the ankles, hands,  or feet Shortness of breath or trouble breathing Spleen injury--pain in upper left stomach or shoulder Unusual bruising or bleeding Side effects that usually do not require medical attention (report to your care team if they continue or are bothersome): Back pain Bone pain Fatigue Fever Headache Nausea This list may not describe all possible side effects. Call your doctor for medical advice about side effects. You may report side effects to FDA at 1-800-FDA-1088. Where should I keep my medication? Keep out of the reach of children and pets. Keep this medication in the original packaging until you are ready to take it. Protect from light. See product for storage information. Each product may have different instructions. Get rid of any unused medication after the expiration date. To get rid of medications that are no longer needed or have expired: Take the medication to a medications take-back program. Check with your pharmacy or law enforcement to find a location. If you cannot return the medication, ask your pharmacist or care team how to get rid of this medication safely. NOTE: This sheet is a summary. It may not cover all possible information. If you have questions about this medicine, talk to your doctor, pharmacist, or health care provider.  2024 Elsevier/Gold Standard (2021-07-04 00:00:00)

## 2024-03-15 ENCOUNTER — Inpatient Hospital Stay

## 2024-03-15 VITALS — BP 124/68 | HR 62 | Temp 97.4°F | Resp 18

## 2024-03-15 DIAGNOSIS — D46Z Other myelodysplastic syndromes: Secondary | ICD-10-CM

## 2024-03-15 DIAGNOSIS — Z5111 Encounter for antineoplastic chemotherapy: Secondary | ICD-10-CM | POA: Diagnosis not present

## 2024-03-15 DIAGNOSIS — D4621 Refractory anemia with excess of blasts 1: Secondary | ICD-10-CM

## 2024-03-15 DIAGNOSIS — C9201 Acute myeloblastic leukemia, in remission: Secondary | ICD-10-CM

## 2024-03-15 MED ORDER — AMLODIPINE BESYLATE 2.5 MG PO TABS: 5.0000 mg | ORAL_TABLET | Freq: Every day | ORAL | Status: AC

## 2024-03-15 MED ORDER — SODIUM CHLORIDE 0.9 % IV SOLN
20.0000 mg/m2 | Freq: Once | INTRAVENOUS | Status: AC
  Administered 2024-03-15: 40 mg via INTRAVENOUS
  Filled 2024-03-15: qty 8

## 2024-03-15 MED ORDER — FAMOTIDINE IN NACL 20-0.9 MG/50ML-% IV SOLN
20.0000 mg | INTRAVENOUS | Status: AC
  Administered 2024-03-15 (×2): 20 mg via INTRAVENOUS
  Filled 2024-03-15: qty 50

## 2024-03-15 MED ORDER — SODIUM CHLORIDE 0.9 % IV SOLN: INTRAVENOUS | Status: DC

## 2024-03-15 MED ORDER — PROCHLORPERAZINE MALEATE 10 MG PO TABS: 10.0000 mg | ORAL_TABLET | Freq: Once | ORAL | Status: DC

## 2024-03-15 MED ORDER — SODIUM CHLORIDE 0.9 % IV SOLN: Freq: Once | INTRAVENOUS | Status: AC

## 2024-03-15 NOTE — Progress Notes (Signed)
 1100-Pt.'s BP up to 118/64 and pt states that he feels back to his normal self.  No complaints from patient at this time.  Order received from CANDIE Pepper NP to proceed with Dacogen  at this time, to instruct pt to decrease Norvasc  down to 5 mg daily and to hold Norvasc  if systolic bp is less than 120.  Pt instructed of NP orders.  Teach back done.

## 2024-03-15 NOTE — Progress Notes (Signed)
 OK to treat with ANC-0.8 and WBC-1.3 per order of Dr. Timmy.

## 2024-03-15 NOTE — Patient Instructions (Addendum)
" °  Decrease dose of Norvasc  to 5 mg daily.  Check your blood pressure before you take the Norvasc .  If the top number of your blood pressure is 120 or less, hold on taking your Norvasc  on that day.  If your blood pressure remains low, please let us  know.     [facility] - A DEPT OF [facility]  Discharge Instructions: Thank you for choosing [facility] to provide your oncology and hematology care.   If you have a lab appointment with the Cancer Center, please go directly to the Cancer Center and check in at the registration area.  Wear comfortable clothing and clothing appropriate for easy access to any Portacath or PICC line.   We strive to give you quality time with your provider. You may need to reschedule your appointment if you arrive late (15 or more minutes).  Arriving late affects you and other patients whose appointments are after yours.  Also, if you miss three or more appointments without notifying the office, you may be dismissed from the clinic at the providers discretion.      For prescription refill requests, have your pharmacy contact our office and allow 72 hours for refills to be completed.    Today you received the following chemotherapy and/or immunotherapy agents:  Dacogen       To help prevent nausea and vomiting after your treatment, we encourage you to take your nausea medication as directed.  BELOW ARE SYMPTOMS THAT SHOULD BE REPORTED IMMEDIATELY: *FEVER GREATER THAN 100.4 F (38 C) OR HIGHER *CHILLS OR SWEATING *NAUSEA AND VOMITING THAT IS NOT CONTROLLED WITH YOUR NAUSEA MEDICATION *UNUSUAL SHORTNESS OF BREATH *UNUSUAL BRUISING OR BLEEDING *URINARY PROBLEMS (pain or burning when urinating, or frequent urination) *BOWEL PROBLEMS (unusual diarrhea, constipation, pain near the anus) TENDERNESS IN MOUTH AND THROAT WITH OR WITHOUT PRESENCE OF ULCERS (sore throat, sores in mouth, or a toothache) UNUSUAL RASH, SWELLING OR  PAIN  UNUSUAL VAGINAL DISCHARGE OR ITCHING   Items with * indicate a potential emergency and should be followed up as soon as possible or go to the Emergency Department if any problems should occur.  Please show the CHEMOTHERAPY ALERT CARD or IMMUNOTHERAPY ALERT CARD at check-in to the Emergency Department and triage nurse. Should you have questions after your visit or need to cancel or reschedule your appointment, please contact [facility] - A DEPT OF [facility]  [phone] and follow the prompts.  Office hours are 8:00 a.m. to 4:30 p.m. Monday - Friday. Please note that voicemails left after 4:00 p.m. may not be returned until the following business day.  We are closed weekends and major holidays. You have access to a nurse at all times for urgent questions. Please call the main number to the clinic [phone] and follow the prompts.  For any non-urgent questions, you may also contact your provider using MyChart. We now offer e-Visits for anyone 33 and older to request care online for non-urgent symptoms. For details visit [email or web address].   Also download the MyChart app! Go to the app store, search MyChart, open the app, select [facility], and log in with your MyChart username and password.   "

## 2024-03-15 NOTE — Progress Notes (Signed)
 Pt states that he feels as though he is going to pass out and that everything feels off.  He states that he is lightheaded and has blurred vision with a dry cough.  Pt is red in the face and has a glassed over look in his eyes.  CANDIE Pepper NP to pt.'s chairside to assess pt.  Order received to give pt Pepcid  40 mg IV, a bolus of NS over one hour and to recheck BP in one hour.

## 2024-03-16 ENCOUNTER — Inpatient Hospital Stay

## 2024-03-16 VITALS — BP 120/59 | HR 55 | Temp 97.7°F | Resp 19

## 2024-03-16 DIAGNOSIS — D46Z Other myelodysplastic syndromes: Secondary | ICD-10-CM

## 2024-03-16 DIAGNOSIS — C9201 Acute myeloblastic leukemia, in remission: Secondary | ICD-10-CM

## 2024-03-16 DIAGNOSIS — D4621 Refractory anemia with excess of blasts 1: Secondary | ICD-10-CM

## 2024-03-16 DIAGNOSIS — Z5111 Encounter for antineoplastic chemotherapy: Secondary | ICD-10-CM | POA: Diagnosis not present

## 2024-03-16 MED ORDER — SODIUM CHLORIDE 0.9 % IV SOLN: INTRAVENOUS | Status: DC

## 2024-03-16 MED ORDER — SODIUM CHLORIDE 0.9 % IV SOLN
20.0000 mg/m2 | Freq: Once | INTRAVENOUS | Status: AC
  Administered 2024-03-16: 40 mg via INTRAVENOUS
  Filled 2024-03-16: qty 8

## 2024-03-16 MED ORDER — PROCHLORPERAZINE MALEATE 10 MG PO TABS: 10.0000 mg | ORAL_TABLET | Freq: Once | ORAL | Status: DC

## 2024-03-17 ENCOUNTER — Inpatient Hospital Stay

## 2024-03-17 VITALS — BP 126/68 | HR 94 | Temp 97.6°F | Resp 18

## 2024-03-17 DIAGNOSIS — Z5111 Encounter for antineoplastic chemotherapy: Secondary | ICD-10-CM | POA: Diagnosis not present

## 2024-03-17 DIAGNOSIS — C9201 Acute myeloblastic leukemia, in remission: Secondary | ICD-10-CM

## 2024-03-17 DIAGNOSIS — D4621 Refractory anemia with excess of blasts 1: Secondary | ICD-10-CM

## 2024-03-17 DIAGNOSIS — D46Z Other myelodysplastic syndromes: Secondary | ICD-10-CM

## 2024-03-17 LAB — CBC WITH DIFFERENTIAL (CANCER CENTER ONLY)
Abs Immature Granulocytes: 0.09 K/uL — ABNORMAL HIGH (ref 0.00–0.07)
Basophils Absolute: 0 K/uL (ref 0.0–0.1)
Basophils Relative: 0 %
Eosinophils Absolute: 0 K/uL (ref 0.0–0.5)
Eosinophils Relative: 0 %
HCT: 32.1 % — ABNORMAL LOW (ref 39.0–52.0)
Hemoglobin: 10.8 g/dL — ABNORMAL LOW (ref 13.0–17.0)
Immature Granulocytes: 2 %
Lymphocytes Relative: 3 %
Lymphs Abs: 0.2 K/uL — ABNORMAL LOW (ref 0.7–4.0)
MCH: 34.3 pg — ABNORMAL HIGH (ref 26.0–34.0)
MCHC: 33.6 g/dL (ref 30.0–36.0)
MCV: 101.9 fL — ABNORMAL HIGH (ref 80.0–100.0)
Monocytes Absolute: 0.3 K/uL (ref 0.1–1.0)
Monocytes Relative: 7 %
Neutro Abs: 4.4 K/uL (ref 1.7–7.7)
Neutrophils Relative %: 88 %
Platelet Count: 92 K/uL — ABNORMAL LOW (ref 150–400)
RBC: 3.15 MIL/uL — ABNORMAL LOW (ref 4.22–5.81)
RDW: 16.9 % — ABNORMAL HIGH (ref 11.5–15.5)
WBC Count: 5 K/uL (ref 4.0–10.5)
nRBC: 0 % (ref 0.0–0.2)

## 2024-03-17 LAB — CMP (CANCER CENTER ONLY)
ALT: 12 U/L (ref 0–44)
AST: 16 U/L (ref 15–41)
Albumin: 4 g/dL (ref 3.5–5.0)
Alkaline Phosphatase: 75 U/L (ref 38–126)
Anion gap: 10 (ref 5–15)
BUN: 16 mg/dL (ref 8–23)
CO2: 25 mmol/L (ref 22–32)
Calcium: 9.4 mg/dL (ref 8.9–10.3)
Chloride: 105 mmol/L (ref 98–111)
Creatinine: 1.06 mg/dL (ref 0.61–1.24)
GFR, Estimated: >60 mL/min
Glucose, Bld: 145 mg/dL — ABNORMAL HIGH (ref 70–99)
Potassium: 4.1 mmol/L (ref 3.5–5.1)
Sodium: 140 mmol/L (ref 135–145)
Total Bilirubin: 0.4 mg/dL (ref 0.0–1.2)
Total Protein: 5.8 g/dL — ABNORMAL LOW (ref 6.5–8.1)

## 2024-03-17 MED ORDER — SODIUM CHLORIDE 0.9 % IV SOLN
20.0000 mg/m2 | Freq: Once | INTRAVENOUS | Status: AC
  Administered 2024-03-17: 40 mg via INTRAVENOUS
  Filled 2024-03-17: qty 8

## 2024-03-17 MED ORDER — PROCHLORPERAZINE MALEATE 10 MG PO TABS: 10.0000 mg | ORAL_TABLET | Freq: Once | ORAL | Status: DC

## 2024-03-17 MED ORDER — SODIUM CHLORIDE 0.9 % IV SOLN: INTRAVENOUS | Status: DC

## 2024-03-17 NOTE — Patient Instructions (Signed)
 PICC Home Care Guide A peripherally inserted central catheter (PICC) is a form of IV access that allows medicines and IV fluids to be quickly put into the blood and spread throughout the body. The PICC is a long, thin, flexible tube (catheter) that is put into a vein in a person's arm or leg. The catheter ends in a large vein just outside the heart called the superior vena cava (SVC). After the PICC is put in, a chest X-ray may be done to make sure that it is in the right place. A PICC may be placed for different reasons, such as: To give medicines and liquid nutrition. To give IV fluids and blood products. To take blood samples often. If there is trouble placing a peripheral intravenous (PIV) catheter. If cared for properly, a PICC can remain in place for many months. Having a PICC can allow you to go home from the hospital sooner and continue treatment at home. Medicines and PICC care can be managed at home by a family member, caregiver, or home health care team. What are the risks? Generally, having a PICC is safe. However, problems may occur, including: A blood clot (thrombus) forming in or at the end of the PICC. A blood clot forming in a vein (deep vein thrombosis) or traveling to the lung (pulmonary embolism). Inflammation of the vein (phlebitis) in which the PICC is placed. Infection at the insertion site or in the blood. Blood infections from central lines, like PICCs, can be serious and often require a hospital stay. PICC malposition, or PICC movement or poor placement. A break or cut in the PICC. Do not use scissors near the PICC. Nerve or tendon irritation or injury during PICC insertion. How to care for your PICC Please follow the specific guidelines provided by your health care provider. Preventing infection You and any caregivers should wash your hands often with soap and water for at least 20 seconds. Wash hands: Before touching the PICC or the infusion device. Before changing a  bandage (dressing). Do not change the dressing unless you have been taught to do so and have shown you are able to change it safely. Flush the PICC as told. Tell your health care provider right away if the PICC is hard to flush or does not flush. Do not use force to flush the PICC. Use clean and germ-free (sterile) supplies only. Keep the supplies in a dry place. Do not reuse needles, syringes, or any other supplies. Reusing supplies can lead to infection. Keep the PICC dressing dry and secure it with tape if the edges stop sticking to your skin. Check your PICC insertion site every day for signs of infection. Check for: Redness, swelling, or pain. Fluid or blood. Warmth. Pus or a bad smell. Preventing other problems Do not use a syringe that is less than 10 mL to flush the PICC. Do not have your blood pressure checked on the arm in which the PICC is placed. Do not ever pull or tug on the PICC. Keep it secured to your arm with tape or a stretch wrap when not in use. Do not take the PICC out yourself. Only a trained health care provider should remove the PICC. Keep pets and children away from your PICC. How to care for your PICC dressing Keep your PICC dressing clean and dry to prevent infection. Do not take baths, swim, or use a hot tub until your health care provider approves. Ask your health care provider if you can take  showers. You may only be allowed to take sponge baths. When you are allowed to shower: Ask your health care provider to teach you how to wrap the PICC. Cover the PICC with clear plastic wrap and tape to keep it dry while showering. Follow instructions from your health care provider about how to take care of your insertion site and dressing. Make sure you: Wash your hands with soap and water for at least 20 seconds before and after you change your dressing. If soap and water are not available, use hand sanitizer. Change your dressing only if taught to do so by your health care  provider. Your PICC dressing needs to be changed if it becomes loose or wet. Leave stitches (sutures), skin glue, or adhesive strips in place. These skin closures may need to stay in place for 2 weeks or longer. If adhesive strip edges start to loosen and curl up, you may trim the loose edges. Do not remove adhesive strips completely unless your health care provider tells you to do that. Follow these instructions at home: Disposal of supplies Throw away any syringes in a disposal container that is meant for sharp items (sharps container). You can buy a sharps container from a pharmacy, or you can make one by using an empty, hard plastic bottle with a lid. Place any used dressings or infusion bags into a plastic bag. Throw that bag in the trash. General instructions  Always carry your PICC identification card or wear a medical alert bracelet. Keep the tube clamped at all times, unless it is being used. Always carry a smooth-edge clamp with you to clamp the PICC if it breaks. Do not use scissors or sharp objects near the tube. You may bend your arm and move it freely. If your PICC is near or at the bend of your elbow, avoid activity with repeated motion at the elbow. Avoid lifting heavy objects as told by your health care provider. Keep all follow-up visits. This is important. You will need to have your PICC dressing changed at least once a week. Contact a health care provider if: You have pain in your arm, ear, face, or teeth. You have a fever or chills. You have redness, swelling, or pain around the insertion site. You have fluid or blood coming from the insertion site. Your insertion site feels warm to the touch. You have pus or a bad smell coming from the insertion site. Your skin feels hard and raised around the insertion site. Your PICC dressing has gotten wet or is coming off and you have not been taught how to change it. Get help right away if: You have problems with your PICC, such as  your PICC: Was tugged or pulled and has partially come out. Do not  push the PICC back in. Cannot be flushed, is hard to flush, or leaks around the insertion site when it is flushed. Makes a flushing sound when it is flushed. Appears to have a hole or tear. Is accidentally pulled all the way out. If this happens, cover the insertion site with a gauze dressing. Do not throw the PICC away. Your health care provider will need to check it to be sure the entire catheter came out. You feel your heart racing or skipping beats, or you have chest pain. You have shortness of breath or trouble breathing. You have swelling, redness, warmth, or pain in the arm in which the PICC is placed. You have a red streak going up your arm that  starts under the PICC dressing. These symptoms may be an emergency. Get help right away. Call 911. Do not wait to see if the symptoms will go away. Do not drive yourself to the hospital. Summary A peripherally inserted central catheter (PICC) is a long, thin, flexible tube (catheter) that is put into a vein in the arm or leg. If cared for properly, a PICC can remain in place for many months. Having a PICC can allow you to go home from the hospital sooner and continue treatment at home. The PICC is inserted using a germ-free (sterile) technique by a specially trained health care provider. Only a trained health care provider should remove it. Do not have your blood pressure checked on the arm in which your PICC is placed. Always keep your PICC identification card with you. This information is not intended to replace advice given to you by your health care provider. Make sure you discuss any questions you have with your health care provider. Document Revised: 08/29/2020 Document Reviewed: 08/29/2020 Elsevier Patient Education  2024 [employer].

## 2024-03-17 NOTE — Patient Instructions (Signed)
 [facility] - A DEPT OF [facility]  Discharge Instructions: Thank you for choosing [facility] to provide your oncology and hematology care.   If you have a lab appointment with the Cancer Center, please go directly to the Cancer Center and check in at the registration area.  Wear comfortable clothing and clothing appropriate for easy access to any Portacath or PICC line.   We strive to give you quality time with your provider. You may need to reschedule your appointment if you arrive late (15 or more minutes).  Arriving late affects you and other patients whose appointments are after yours.  Also, if you miss three or more appointments without notifying the office, you may be dismissed from the clinic at the provider's discretion.      For prescription refill requests, have your pharmacy contact our office and allow 72 hours for refills to be completed.    Today you received the following chemotherapy and/or immunotherapy agents Dacogen      To help prevent nausea and vomiting after your treatment, we encourage you to take your nausea medication as directed.  BELOW ARE SYMPTOMS THAT SHOULD BE REPORTED IMMEDIATELY: *FEVER GREATER THAN 100.4 F (38 C) OR HIGHER *CHILLS OR SWEATING *NAUSEA AND VOMITING THAT IS NOT CONTROLLED WITH YOUR NAUSEA MEDICATION *UNUSUAL SHORTNESS OF BREATH *UNUSUAL BRUISING OR BLEEDING *URINARY PROBLEMS (pain or burning when urinating, or frequent urination) *BOWEL PROBLEMS (unusual diarrhea, constipation, pain near the anus) TENDERNESS IN MOUTH AND THROAT WITH OR WITHOUT PRESENCE OF ULCERS (sore throat, sores in mouth, or a toothache) UNUSUAL RASH, SWELLING OR PAIN  UNUSUAL VAGINAL DISCHARGE OR ITCHING   Items with * indicate a potential emergency and should be followed up as soon as possible or go to the Emergency Department if any problems should occur.  Please show the CHEMOTHERAPY ALERT CARD or IMMUNOTHERAPY  ALERT CARD at check-in to the Emergency Department and triage nurse. Should you have questions after your visit or need to cancel or reschedule your appointment, please contact [facility] - A DEPT OF [facility]  [phone] and follow the prompts.  Office hours are 8:00 a.m. to 4:30 p.m. Monday - Friday. Please note that voicemails left after 4:00 p.m. may not be returned until the following business day.  We are closed weekends and major holidays. You have access to a nurse at all times for urgent questions. Please call the main number to the clinic [phone] and follow the prompts.  For any non-urgent questions, you may also contact your provider using MyChart. We now offer e-Visits for anyone 1 and older to request care online for non-urgent symptoms. For details visit [email or web address].   Also download the MyChart app! Go to the app store, search "MyChart", open the app, select [place], and log in with your MyChart username and password.

## 2024-03-21 ENCOUNTER — Inpatient Hospital Stay

## 2024-03-22 ENCOUNTER — Inpatient Hospital Stay

## 2024-03-22 ENCOUNTER — Other Ambulatory Visit: Payer: Self-pay

## 2024-03-22 DIAGNOSIS — Z5111 Encounter for antineoplastic chemotherapy: Secondary | ICD-10-CM | POA: Diagnosis not present

## 2024-03-22 DIAGNOSIS — D72810 Lymphocytopenia: Secondary | ICD-10-CM

## 2024-03-22 DIAGNOSIS — D4621 Refractory anemia with excess of blasts 1: Secondary | ICD-10-CM

## 2024-03-22 DIAGNOSIS — D46Z Other myelodysplastic syndromes: Secondary | ICD-10-CM

## 2024-03-22 DIAGNOSIS — D696 Thrombocytopenia, unspecified: Secondary | ICD-10-CM

## 2024-03-22 DIAGNOSIS — D708 Other neutropenia: Secondary | ICD-10-CM

## 2024-03-22 DIAGNOSIS — D709 Neutropenia, unspecified: Secondary | ICD-10-CM

## 2024-03-22 LAB — CMP (CANCER CENTER ONLY)
ALT: 16 U/L (ref 0–44)
AST: 14 U/L — ABNORMAL LOW (ref 15–41)
Albumin: 4.4 g/dL (ref 3.5–5.0)
Alkaline Phosphatase: 77 U/L (ref 38–126)
Anion gap: 9 (ref 5–15)
BUN: 18 mg/dL (ref 8–23)
CO2: 29 mmol/L (ref 22–32)
Calcium: 10.1 mg/dL (ref 8.9–10.3)
Chloride: 105 mmol/L (ref 98–111)
Creatinine: 1.05 mg/dL (ref 0.61–1.24)
GFR, Estimated: >60 mL/min
Glucose, Bld: 99 mg/dL (ref 70–99)
Potassium: 4.6 mmol/L (ref 3.5–5.1)
Sodium: 142 mmol/L (ref 135–145)
Total Bilirubin: 0.5 mg/dL (ref 0.0–1.2)
Total Protein: 6.4 g/dL — ABNORMAL LOW (ref 6.5–8.1)

## 2024-03-22 LAB — CBC WITH DIFFERENTIAL (CANCER CENTER ONLY)
Abs Immature Granulocytes: 0.03 10*3/uL (ref 0.00–0.07)
Basophils Absolute: 0 10*3/uL (ref 0.0–0.1)
Basophils Relative: 0 %
Eosinophils Absolute: 0 10*3/uL (ref 0.0–0.5)
Eosinophils Relative: 0 %
HCT: 34.7 % — ABNORMAL LOW (ref 39.0–52.0)
Hemoglobin: 11.7 g/dL — ABNORMAL LOW (ref 13.0–17.0)
Immature Granulocytes: 1 %
Lymphocytes Relative: 6 %
Lymphs Abs: 0.2 10*3/uL — ABNORMAL LOW (ref 0.7–4.0)
MCH: 34 pg (ref 26.0–34.0)
MCHC: 33.7 g/dL (ref 30.0–36.0)
MCV: 100.9 fL — ABNORMAL HIGH (ref 80.0–100.0)
Monocytes Absolute: 0.2 10*3/uL (ref 0.1–1.0)
Monocytes Relative: 7 %
Neutro Abs: 2.3 10*3/uL (ref 1.7–7.7)
Neutrophils Relative %: 86 %
Platelet Count: 55 10*3/uL — ABNORMAL LOW (ref 150–400)
RBC: 3.44 MIL/uL — ABNORMAL LOW (ref 4.22–5.81)
RDW: 15.9 % — ABNORMAL HIGH (ref 11.5–15.5)
WBC Count: 2.7 10*3/uL — ABNORMAL LOW (ref 4.0–10.5)
nRBC: 0 % (ref 0.0–0.2)

## 2024-03-22 LAB — FIBRINOGEN: Fibrinogen: 381 mg/dL (ref 210–475)

## 2024-03-22 LAB — SAMPLE TO BLOOD BANK

## 2024-03-22 LAB — D-DIMER, QUANTITATIVE: D-Dimer, Quant: 1.21 ug{FEU}/mL — ABNORMAL HIGH (ref 0.00–0.50)

## 2024-03-22 LAB — APTT: aPTT: 31 s (ref 24–36)

## 2024-03-22 LAB — LACTATE DEHYDROGENASE: LDH: 226 U/L (ref 105–235)

## 2024-03-22 LAB — PHOSPHORUS: Phosphorus: 2.3 mg/dL — ABNORMAL LOW (ref 2.5–4.6)

## 2024-03-22 LAB — PROTIME-INR
INR: 1 (ref 0.8–1.2)
Prothrombin Time: 13.6 s (ref 11.4–15.2)

## 2024-03-22 LAB — URIC ACID: Uric Acid, Serum: 3.9 mg/dL (ref 3.7–8.6)

## 2024-03-22 LAB — MAGNESIUM: Magnesium: 2.3 mg/dL (ref 1.7–2.4)

## 2024-03-22 NOTE — Patient Instructions (Signed)
 PICC Home Care Guide A peripherally inserted central catheter (PICC) is a form of IV access that allows medicines and IV fluids to be quickly put into the blood and spread throughout the body. The PICC is a long, thin, flexible tube (catheter) that is put into a vein in a person's arm or leg. The catheter ends in a large vein just outside the heart called the superior vena cava (SVC). After the PICC is put in, a chest X-ray may be done to make sure that it is in the right place. A PICC may be placed for different reasons, such as: To give medicines and liquid nutrition. To give IV fluids and blood products. To take blood samples often. If there is trouble placing a peripheral intravenous (PIV) catheter. If cared for properly, a PICC can remain in place for many months. Having a PICC can allow you to go home from the hospital sooner and continue treatment at home. Medicines and PICC care can be managed at home by a family member, caregiver, or home health care team. What are the risks? Generally, having a PICC is safe. However, problems may occur, including: A blood clot (thrombus) forming in or at the end of the PICC. A blood clot forming in a vein (deep vein thrombosis) or traveling to the lung (pulmonary embolism). Inflammation of the vein (phlebitis) in which the PICC is placed. Infection at the insertion site or in the blood. Blood infections from central lines, like PICCs, can be serious and often require a hospital stay. PICC malposition, or PICC movement or poor placement. A break or cut in the PICC. Do not use scissors near the PICC. Nerve or tendon irritation or injury during PICC insertion. How to care for your PICC Please follow the specific guidelines provided by your health care provider. Preventing infection You and any caregivers should wash your hands often with soap and water for at least 20 seconds. Wash hands: Before touching the PICC or the infusion device. Before changing a  bandage (dressing). Do not change the dressing unless you have been taught to do so and have shown you are able to change it safely. Flush the PICC as told. Tell your health care provider right away if the PICC is hard to flush or does not flush. Do not use force to flush the PICC. Use clean and germ-free (sterile) supplies only. Keep the supplies in a dry place. Do not reuse needles, syringes, or any other supplies. Reusing supplies can lead to infection. Keep the PICC dressing dry and secure it with tape if the edges stop sticking to your skin. Check your PICC insertion site every day for signs of infection. Check for: Redness, swelling, or pain. Fluid or blood. Warmth. Pus or a bad smell. Preventing other problems Do not use a syringe that is less than 10 mL to flush the PICC. Do not have your blood pressure checked on the arm in which the PICC is placed. Do not ever pull or tug on the PICC. Keep it secured to your arm with tape or a stretch wrap when not in use. Do not take the PICC out yourself. Only a trained health care provider should remove the PICC. Keep pets and children away from your PICC. How to care for your PICC dressing Keep your PICC dressing clean and dry to prevent infection. Do not take baths, swim, or use a hot tub until your health care provider approves. Ask your health care provider if you can take  showers. You may only be allowed to take sponge baths. When you are allowed to shower: Ask your health care provider to teach you how to wrap the PICC. Cover the PICC with clear plastic wrap and tape to keep it dry while showering. Follow instructions from your health care provider about how to take care of your insertion site and dressing. Make sure you: Wash your hands with soap and water for at least 20 seconds before and after you change your dressing. If soap and water are not available, use hand sanitizer. Change your dressing only if taught to do so by your health care  provider. Your PICC dressing needs to be changed if it becomes loose or wet. Leave stitches (sutures), skin glue, or adhesive strips in place. These skin closures may need to stay in place for 2 weeks or longer. If adhesive strip edges start to loosen and curl up, you may trim the loose edges. Do not remove adhesive strips completely unless your health care provider tells you to do that. Follow these instructions at home: Disposal of supplies Throw away any syringes in a disposal container that is meant for sharp items (sharps container). You can buy a sharps container from a pharmacy, or you can make one by using an empty, hard plastic bottle with a lid. Place any used dressings or infusion bags into a plastic bag. Throw that bag in the trash. General instructions  Always carry your PICC identification card or wear a medical alert bracelet. Keep the tube clamped at all times, unless it is being used. Always carry a smooth-edge clamp with you to clamp the PICC if it breaks. Do not use scissors or sharp objects near the tube. You may bend your arm and move it freely. If your PICC is near or at the bend of your elbow, avoid activity with repeated motion at the elbow. Avoid lifting heavy objects as told by your health care provider. Keep all follow-up visits. This is important. You will need to have your PICC dressing changed at least once a week. Contact a health care provider if: You have pain in your arm, ear, face, or teeth. You have a fever or chills. You have redness, swelling, or pain around the insertion site. You have fluid or blood coming from the insertion site. Your insertion site feels warm to the touch. You have pus or a bad smell coming from the insertion site. Your skin feels hard and raised around the insertion site. Your PICC dressing has gotten wet or is coming off and you have not been taught how to change it. Get help right away if: You have problems with your PICC, such as  your PICC: Was tugged or pulled and has partially come out. Do not  push the PICC back in. Cannot be flushed, is hard to flush, or leaks around the insertion site when it is flushed. Makes a flushing sound when it is flushed. Appears to have a hole or tear. Is accidentally pulled all the way out. If this happens, cover the insertion site with a gauze dressing. Do not throw the PICC away. Your health care provider will need to check it to be sure the entire catheter came out. You feel your heart racing or skipping beats, or you have chest pain. You have shortness of breath or trouble breathing. You have swelling, redness, warmth, or pain in the arm in which the PICC is placed. You have a red streak going up your arm that  starts under the PICC dressing. These symptoms may be an emergency. Get help right away. Call 911. Do not wait to see if the symptoms will go away. Do not drive yourself to the hospital. Summary A peripherally inserted central catheter (PICC) is a long, thin, flexible tube (catheter) that is put into a vein in the arm or leg. If cared for properly, a PICC can remain in place for many months. Having a PICC can allow you to go home from the hospital sooner and continue treatment at home. The PICC is inserted using a germ-free (sterile) technique by a specially trained health care provider. Only a trained health care provider should remove it. Do not have your blood pressure checked on the arm in which your PICC is placed. Always keep your PICC identification card with you. This information is not intended to replace advice given to you by your health care provider. Make sure you discuss any questions you have with your health care provider. Document Revised: 08/29/2020 Document Reviewed: 08/29/2020 Elsevier Patient Education  2024 [employer].

## 2024-03-24 ENCOUNTER — Encounter: Payer: Self-pay | Admitting: Hematology & Oncology

## 2024-03-24 ENCOUNTER — Other Ambulatory Visit: Payer: Self-pay

## 2024-03-24 ENCOUNTER — Inpatient Hospital Stay

## 2024-03-24 DIAGNOSIS — C9201 Acute myeloblastic leukemia, in remission: Secondary | ICD-10-CM

## 2024-03-24 DIAGNOSIS — Z5111 Encounter for antineoplastic chemotherapy: Secondary | ICD-10-CM | POA: Diagnosis not present

## 2024-03-24 LAB — CBC WITH DIFFERENTIAL (CANCER CENTER ONLY)
Abs Immature Granulocytes: 0.02 10*3/uL (ref 0.00–0.07)
Basophils Absolute: 0 10*3/uL (ref 0.0–0.1)
Basophils Relative: 1 %
Eosinophils Absolute: 0 10*3/uL (ref 0.0–0.5)
Eosinophils Relative: 1 %
HCT: 30.4 % — ABNORMAL LOW (ref 39.0–52.0)
Hemoglobin: 10.2 g/dL — ABNORMAL LOW (ref 13.0–17.0)
Immature Granulocytes: 1 %
Lymphocytes Relative: 7 %
Lymphs Abs: 0.2 10*3/uL — ABNORMAL LOW (ref 0.7–4.0)
MCH: 33.6 pg (ref 26.0–34.0)
MCHC: 33.6 g/dL (ref 30.0–36.0)
MCV: 100 fL (ref 80.0–100.0)
Monocytes Absolute: 0.1 10*3/uL (ref 0.1–1.0)
Monocytes Relative: 4 %
Neutro Abs: 1.9 10*3/uL (ref 1.7–7.7)
Neutrophils Relative %: 86 %
Platelet Count: 38 10*3/uL — ABNORMAL LOW (ref 150–400)
RBC: 3.04 MIL/uL — ABNORMAL LOW (ref 4.22–5.81)
RDW: 15.9 % — ABNORMAL HIGH (ref 11.5–15.5)
WBC Count: 2.2 10*3/uL — ABNORMAL LOW (ref 4.0–10.5)
nRBC: 0 % (ref 0.0–0.2)

## 2024-03-24 LAB — CMP (CANCER CENTER ONLY)
ALT: 14 U/L (ref 0–44)
AST: 15 U/L (ref 15–41)
Albumin: 4.1 g/dL (ref 3.5–5.0)
Alkaline Phosphatase: 72 U/L (ref 38–126)
Anion gap: 8 (ref 5–15)
BUN: 20 mg/dL (ref 8–23)
CO2: 26 mmol/L (ref 22–32)
Calcium: 9.4 mg/dL (ref 8.9–10.3)
Chloride: 105 mmol/L (ref 98–111)
Creatinine: 0.94 mg/dL (ref 0.61–1.24)
GFR, Estimated: >60 mL/min
Glucose, Bld: 150 mg/dL — ABNORMAL HIGH (ref 70–99)
Potassium: 4.5 mmol/L (ref 3.5–5.1)
Sodium: 138 mmol/L (ref 135–145)
Total Bilirubin: 0.4 mg/dL (ref 0.0–1.2)
Total Protein: 5.7 g/dL — ABNORMAL LOW (ref 6.5–8.1)

## 2024-03-24 NOTE — Patient Instructions (Signed)

## 2024-03-24 NOTE — Progress Notes (Signed)
 Specialty Pharmacy Ongoing Clinical Assessment Note  Patrick [name] is a 84 y.o. male who is being followed by the specialty pharmacy service for RxSp Oncology   Patient's specialty medication(s) reviewed today: Venetoclax  (VENCLEXTA )   Missed doses in the last 4 weeks: 0   Patient/Caregiver did not have any additional questions or concerns.   Therapeutic benefit summary: Patient is achieving benefit   Adverse events/side effects summary: No adverse events/side effects   Patient's therapy is appropriate to: Continue    Goals Addressed             This Visit's Progress    Slow Disease Progression   On track    Patient is on track. Patient will maintain adherence. 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 Patient stated he will return to [facility] in a few months for another check up         Follow up: 3 months  [facility]

## 2024-03-25 ENCOUNTER — Inpatient Hospital Stay

## 2024-03-25 DIAGNOSIS — Z5111 Encounter for antineoplastic chemotherapy: Secondary | ICD-10-CM | POA: Diagnosis not present

## 2024-03-25 NOTE — Patient Instructions (Signed)
 PICC Home Care Guide A peripherally inserted central catheter (PICC) is a form of IV access that allows medicines and IV fluids to be quickly put into the blood and spread throughout the body. The PICC is a long, thin, flexible tube (catheter) that is put into a vein in a person's arm or leg. The catheter ends in a large vein just outside the heart called the superior vena cava (SVC). After the PICC is put in, a chest X-ray may be done to make sure that it is in the right place. A PICC may be placed for different reasons, such as: To give medicines and liquid nutrition. To give IV fluids and blood products. To take blood samples often. If there is trouble placing a peripheral intravenous (PIV) catheter. If cared for properly, a PICC can remain in place for many months. Having a PICC can allow you to go home from the hospital sooner and continue treatment at home. Medicines and PICC care can be managed at home by a family member, caregiver, or home health care team. What are the risks? Generally, having a PICC is safe. However, problems may occur, including: A blood clot (thrombus) forming in or at the end of the PICC. A blood clot forming in a vein (deep vein thrombosis) or traveling to the lung (pulmonary embolism). Inflammation of the vein (phlebitis) in which the PICC is placed. Infection at the insertion site or in the blood. Blood infections from central lines, like PICCs, can be serious and often require a hospital stay. PICC malposition, or PICC movement or poor placement. A break or cut in the PICC. Do not use scissors near the PICC. Nerve or tendon irritation or injury during PICC insertion. How to care for your PICC Please follow the specific guidelines provided by your health care provider. Preventing infection You and any caregivers should wash your hands often with soap and water for at least 20 seconds. Wash hands: Before touching the PICC or the infusion device. Before changing a  bandage (dressing). Do not change the dressing unless you have been taught to do so and have shown you are able to change it safely. Flush the PICC as told. Tell your health care provider right away if the PICC is hard to flush or does not flush. Do not use force to flush the PICC. Use clean and germ-free (sterile) supplies only. Keep the supplies in a dry place. Do not reuse needles, syringes, or any other supplies. Reusing supplies can lead to infection. Keep the PICC dressing dry and secure it with tape if the edges stop sticking to your skin. Check your PICC insertion site every day for signs of infection. Check for: Redness, swelling, or pain. Fluid or blood. Warmth. Pus or a bad smell. Preventing other problems Do not use a syringe that is less than 10 mL to flush the PICC. Do not have your blood pressure checked on the arm in which the PICC is placed. Do not ever pull or tug on the PICC. Keep it secured to your arm with tape or a stretch wrap when not in use. Do not take the PICC out yourself. Only a trained health care provider should remove the PICC. Keep pets and children away from your PICC. How to care for your PICC dressing Keep your PICC dressing clean and dry to prevent infection. Do not take baths, swim, or use a hot tub until your health care provider approves. Ask your health care provider if you can take  showers. You may only be allowed to take sponge baths. When you are allowed to shower: Ask your health care provider to teach you how to wrap the PICC. Cover the PICC with clear plastic wrap and tape to keep it dry while showering. Follow instructions from your health care provider about how to take care of your insertion site and dressing. Make sure you: Wash your hands with soap and water for at least 20 seconds before and after you change your dressing. If soap and water are not available, use hand sanitizer. Change your dressing only if taught to do so by your health care  provider. Your PICC dressing needs to be changed if it becomes loose or wet. Leave stitches (sutures), skin glue, or adhesive strips in place. These skin closures may need to stay in place for 2 weeks or longer. If adhesive strip edges start to loosen and curl up, you may trim the loose edges. Do not remove adhesive strips completely unless your health care provider tells you to do that. Follow these instructions at home: Disposal of supplies Throw away any syringes in a disposal container that is meant for sharp items (sharps container). You can buy a sharps container from a pharmacy, or you can make one by using an empty, hard plastic bottle with a lid. Place any used dressings or infusion bags into a plastic bag. Throw that bag in the trash. General instructions  Always carry your PICC identification card or wear a medical alert bracelet. Keep the tube clamped at all times, unless it is being used. Always carry a smooth-edge clamp with you to clamp the PICC if it breaks. Do not use scissors or sharp objects near the tube. You may bend your arm and move it freely. If your PICC is near or at the bend of your elbow, avoid activity with repeated motion at the elbow. Avoid lifting heavy objects as told by your health care provider. Keep all follow-up visits. This is important. You will need to have your PICC dressing changed at least once a week. Contact a health care provider if: You have pain in your arm, ear, face, or teeth. You have a fever or chills. You have redness, swelling, or pain around the insertion site. You have fluid or blood coming from the insertion site. Your insertion site feels warm to the touch. You have pus or a bad smell coming from the insertion site. Your skin feels hard and raised around the insertion site. Your PICC dressing has gotten wet or is coming off and you have not been taught how to change it. Get help right away if: You have problems with your PICC, such as  your PICC: Was tugged or pulled and has partially come out. Do not  push the PICC back in. Cannot be flushed, is hard to flush, or leaks around the insertion site when it is flushed. Makes a flushing sound when it is flushed. Appears to have a hole or tear. Is accidentally pulled all the way out. If this happens, cover the insertion site with a gauze dressing. Do not throw the PICC away. Your health care provider will need to check it to be sure the entire catheter came out. You feel your heart racing or skipping beats, or you have chest pain. You have shortness of breath or trouble breathing. You have swelling, redness, warmth, or pain in the arm in which the PICC is placed. You have a red streak going up your arm that  starts under the PICC dressing. These symptoms may be an emergency. Get help right away. Call 911. Do not wait to see if the symptoms will go away. Do not drive yourself to the hospital. Summary A peripherally inserted central catheter (PICC) is a long, thin, flexible tube (catheter) that is put into a vein in the arm or leg. If cared for properly, a PICC can remain in place for many months. Having a PICC can allow you to go home from the hospital sooner and continue treatment at home. The PICC is inserted using a germ-free (sterile) technique by a specially trained health care provider. Only a trained health care provider should remove it. Do not have your blood pressure checked on the arm in which your PICC is placed. Always keep your PICC identification card with you. This information is not intended to replace advice given to you by your health care provider. Make sure you discuss any questions you have with your health care provider. Document Revised: 08/29/2020 Document Reviewed: 08/29/2020 Elsevier Patient Education  2024 [employer].

## 2024-03-27 ENCOUNTER — Other Ambulatory Visit: Payer: Self-pay | Admitting: Internal Medicine

## 2024-03-28 ENCOUNTER — Inpatient Hospital Stay

## 2024-03-28 ENCOUNTER — Inpatient Hospital Stay: Attending: Hematology & Oncology

## 2024-04-01 ENCOUNTER — Inpatient Hospital Stay: Attending: Hematology & Oncology

## 2024-04-01 ENCOUNTER — Other Ambulatory Visit (HOSPITAL_COMMUNITY): Payer: Self-pay

## 2024-04-01 ENCOUNTER — Encounter: Payer: Self-pay | Admitting: Hematology & Oncology

## 2024-04-01 ENCOUNTER — Other Ambulatory Visit: Payer: Self-pay | Admitting: Hematology & Oncology

## 2024-04-01 ENCOUNTER — Inpatient Hospital Stay

## 2024-04-01 DIAGNOSIS — D709 Neutropenia, unspecified: Secondary | ICD-10-CM

## 2024-04-01 DIAGNOSIS — D46Z Other myelodysplastic syndromes: Secondary | ICD-10-CM

## 2024-04-01 DIAGNOSIS — D696 Thrombocytopenia, unspecified: Secondary | ICD-10-CM

## 2024-04-01 DIAGNOSIS — D708 Other neutropenia: Secondary | ICD-10-CM

## 2024-04-01 DIAGNOSIS — D72810 Lymphocytopenia: Secondary | ICD-10-CM

## 2024-04-01 DIAGNOSIS — C9201 Acute myeloblastic leukemia, in remission: Secondary | ICD-10-CM

## 2024-04-01 DIAGNOSIS — D4621 Refractory anemia with excess of blasts 1: Secondary | ICD-10-CM

## 2024-04-01 LAB — CMP (CANCER CENTER ONLY)
ALT: 15 U/L (ref 0–44)
AST: 15 U/L (ref 15–41)
Albumin: 4.3 g/dL (ref 3.5–5.0)
Alkaline Phosphatase: 73 U/L (ref 38–126)
Anion gap: 7 (ref 5–15)
BUN: 16 mg/dL (ref 8–23)
CO2: 27 mmol/L (ref 22–32)
Calcium: 9.8 mg/dL (ref 8.9–10.3)
Chloride: 106 mmol/L (ref 98–111)
Creatinine: 1.11 mg/dL (ref 0.61–1.24)
GFR, Estimated: >60 mL/min
Glucose, Bld: 150 mg/dL — ABNORMAL HIGH (ref 70–99)
Potassium: 5.1 mmol/L (ref 3.5–5.1)
Sodium: 141 mmol/L (ref 135–145)
Total Bilirubin: 0.4 mg/dL (ref 0.0–1.2)
Total Protein: 5.9 g/dL — ABNORMAL LOW (ref 6.5–8.1)

## 2024-04-01 LAB — LACTATE DEHYDROGENASE: LDH: 206 U/L (ref 105–235)

## 2024-04-01 LAB — CBC WITH DIFFERENTIAL (CANCER CENTER ONLY)
Abs Immature Granulocytes: 0.02 10*3/uL (ref 0.00–0.07)
Basophils Absolute: 0 10*3/uL (ref 0.0–0.1)
Basophils Relative: 0 %
Eosinophils Absolute: 0 10*3/uL (ref 0.0–0.5)
Eosinophils Relative: 2 %
HCT: 30.5 % — ABNORMAL LOW (ref 39.0–52.0)
Hemoglobin: 10.5 g/dL — ABNORMAL LOW (ref 13.0–17.0)
Immature Granulocytes: 2 %
Lymphocytes Relative: 14 %
Lymphs Abs: 0.2 10*3/uL — ABNORMAL LOW (ref 0.7–4.0)
MCH: 34.7 pg — ABNORMAL HIGH (ref 26.0–34.0)
MCHC: 34.4 g/dL (ref 30.0–36.0)
MCV: 100.7 fL — ABNORMAL HIGH (ref 80.0–100.0)
Monocytes Absolute: 0.1 10*3/uL (ref 0.1–1.0)
Monocytes Relative: 8 %
Neutro Abs: 0.8 10*3/uL — ABNORMAL LOW (ref 1.7–7.7)
Neutrophils Relative %: 74 %
Platelet Count: 20 10*3/uL — ABNORMAL LOW (ref 150–400)
RBC: 3.03 MIL/uL — ABNORMAL LOW (ref 4.22–5.81)
RDW: 16.3 % — ABNORMAL HIGH (ref 11.5–15.5)
WBC Count: 1.1 10*3/uL — ABNORMAL LOW (ref 4.0–10.5)
nRBC: 0 % (ref 0.0–0.2)

## 2024-04-01 LAB — SAMPLE TO BLOOD BANK

## 2024-04-01 LAB — MAGNESIUM: Magnesium: 1.8 mg/dL (ref 1.7–2.4)

## 2024-04-01 LAB — FIBRINOGEN: Fibrinogen: 381 mg/dL (ref 210–475)

## 2024-04-01 LAB — APTT: aPTT: 33 s (ref 24–36)

## 2024-04-01 LAB — PROTIME-INR
INR: 1 (ref 0.8–1.2)
Prothrombin Time: 13.8 s (ref 11.4–15.2)

## 2024-04-01 LAB — URIC ACID: Uric Acid, Serum: 4.2 mg/dL (ref 3.7–8.6)

## 2024-04-01 LAB — PHOSPHORUS: Phosphorus: 2.4 mg/dL — ABNORMAL LOW (ref 2.5–4.6)

## 2024-04-01 LAB — D-DIMER, QUANTITATIVE: D-Dimer, Quant: 0.93 ug{FEU}/mL — ABNORMAL HIGH (ref 0.00–0.50)

## 2024-04-01 MED ORDER — VENETOCLAX 100 MG PO TABS: 100.0000 mg | ORAL_TABLET | Freq: Every day | ORAL | 2 refills | Status: AC

## 2024-04-01 NOTE — Patient Instructions (Signed)
 PICC Home Care Guide A peripherally inserted central catheter (PICC) is a form of IV access that allows medicines and IV fluids to be quickly put into the blood and spread throughout the body. The PICC is a long, thin, flexible tube (catheter) that is put into a vein in a person's arm or leg. The catheter ends in a large vein just outside the heart called the superior vena cava (SVC). After the PICC is put in, a chest X-ray may be done to make sure that it is in the right place. A PICC may be placed for different reasons, such as: To give medicines and liquid nutrition. To give IV fluids and blood products. To take blood samples often. If there is trouble placing a peripheral intravenous (PIV) catheter. If cared for properly, a PICC can remain in place for many months. Having a PICC can allow you to go home from the hospital sooner and continue treatment at home. Medicines and PICC care can be managed at home by a family member, caregiver, or home health care team. What are the risks? Generally, having a PICC is safe. However, problems may occur, including: A blood clot (thrombus) forming in or at the end of the PICC. A blood clot forming in a vein (deep vein thrombosis) or traveling to the lung (pulmonary embolism). Inflammation of the vein (phlebitis) in which the PICC is placed. Infection at the insertion site or in the blood. Blood infections from central lines, like PICCs, can be serious and often require a hospital stay. PICC malposition, or PICC movement or poor placement. A break or cut in the PICC. Do not use scissors near the PICC. Nerve or tendon irritation or injury during PICC insertion. How to care for your PICC Please follow the specific guidelines provided by your health care provider. Preventing infection You and any caregivers should wash your hands often with soap and water for at least 20 seconds. Wash hands: Before touching the PICC or the infusion device. Before changing a  bandage (dressing). Do not change the dressing unless you have been taught to do so and have shown you are able to change it safely. Flush the PICC as told. Tell your health care provider right away if the PICC is hard to flush or does not flush. Do not use force to flush the PICC. Use clean and germ-free (sterile) supplies only. Keep the supplies in a dry place. Do not reuse needles, syringes, or any other supplies. Reusing supplies can lead to infection. Keep the PICC dressing dry and secure it with tape if the edges stop sticking to your skin. Check your PICC insertion site every day for signs of infection. Check for: Redness, swelling, or pain. Fluid or blood. Warmth. Pus or a bad smell. Preventing other problems Do not use a syringe that is less than 10 mL to flush the PICC. Do not have your blood pressure checked on the arm in which the PICC is placed. Do not ever pull or tug on the PICC. Keep it secured to your arm with tape or a stretch wrap when not in use. Do not take the PICC out yourself. Only a trained health care provider should remove the PICC. Keep pets and children away from your PICC. How to care for your PICC dressing Keep your PICC dressing clean and dry to prevent infection. Do not take baths, swim, or use a hot tub until your health care provider approves. Ask your health care provider if you can take  showers. You may only be allowed to take sponge baths. When you are allowed to shower: Ask your health care provider to teach you how to wrap the PICC. Cover the PICC with clear plastic wrap and tape to keep it dry while showering. Follow instructions from your health care provider about how to take care of your insertion site and dressing. Make sure you: Wash your hands with soap and water for at least 20 seconds before and after you change your dressing. If soap and water are not available, use hand sanitizer. Change your dressing only if taught to do so by your health care  provider. Your PICC dressing needs to be changed if it becomes loose or wet. Leave stitches (sutures), skin glue, or adhesive strips in place. These skin closures may need to stay in place for 2 weeks or longer. If adhesive strip edges start to loosen and curl up, you may trim the loose edges. Do not remove adhesive strips completely unless your health care provider tells you to do that. Follow these instructions at home: Disposal of supplies Throw away any syringes in a disposal container that is meant for sharp items (sharps container). You can buy a sharps container from a pharmacy, or you can make one by using an empty, hard plastic bottle with a lid. Place any used dressings or infusion bags into a plastic bag. Throw that bag in the trash. General instructions  Always carry your PICC identification card or wear a medical alert bracelet. Keep the tube clamped at all times, unless it is being used. Always carry a smooth-edge clamp with you to clamp the PICC if it breaks. Do not use scissors or sharp objects near the tube. You may bend your arm and move it freely. If your PICC is near or at the bend of your elbow, avoid activity with repeated motion at the elbow. Avoid lifting heavy objects as told by your health care provider. Keep all follow-up visits. This is important. You will need to have your PICC dressing changed at least once a week. Contact a health care provider if: You have pain in your arm, ear, face, or teeth. You have a fever or chills. You have redness, swelling, or pain around the insertion site. You have fluid or blood coming from the insertion site. Your insertion site feels warm to the touch. You have pus or a bad smell coming from the insertion site. Your skin feels hard and raised around the insertion site. Your PICC dressing has gotten wet or is coming off and you have not been taught how to change it. Get help right away if: You have problems with your PICC, such as  your PICC: Was tugged or pulled and has partially come out. Do not  push the PICC back in. Cannot be flushed, is hard to flush, or leaks around the insertion site when it is flushed. Makes a flushing sound when it is flushed. Appears to have a hole or tear. Is accidentally pulled all the way out. If this happens, cover the insertion site with a gauze dressing. Do not throw the PICC away. Your health care provider will need to check it to be sure the entire catheter came out. You feel your heart racing or skipping beats, or you have chest pain. You have shortness of breath or trouble breathing. You have swelling, redness, warmth, or pain in the arm in which the PICC is placed. You have a red streak going up your arm that  starts under the PICC dressing. These symptoms may be an emergency. Get help right away. Call 911. Do not wait to see if the symptoms will go away. Do not drive yourself to the hospital. Summary A peripherally inserted central catheter (PICC) is a long, thin, flexible tube (catheter) that is put into a vein in the arm or leg. If cared for properly, a PICC can remain in place for many months. Having a PICC can allow you to go home from the hospital sooner and continue treatment at home. The PICC is inserted using a germ-free (sterile) technique by a specially trained health care provider. Only a trained health care provider should remove it. Do not have your blood pressure checked on the arm in which your PICC is placed. Always keep your PICC identification card with you. This information is not intended to replace advice given to you by your health care provider. Make sure you discuss any questions you have with your health care provider. Document Revised: 08/29/2020 Document Reviewed: 08/29/2020 Elsevier Patient Education  2024 [employer].

## 2024-04-04 ENCOUNTER — Inpatient Hospital Stay

## 2024-04-07 ENCOUNTER — Inpatient Hospital Stay

## 2024-04-12 ENCOUNTER — Inpatient Hospital Stay

## 2024-04-12 ENCOUNTER — Inpatient Hospital Stay: Admitting: Hematology & Oncology

## 2024-04-13 ENCOUNTER — Inpatient Hospital Stay

## 2024-04-14 ENCOUNTER — Inpatient Hospital Stay

## 2024-05-10 ENCOUNTER — Inpatient Hospital Stay: Admitting: Hematology & Oncology

## 2024-05-10 ENCOUNTER — Inpatient Hospital Stay

## 2024-05-10 ENCOUNTER — Inpatient Hospital Stay: Attending: Hematology & Oncology

## 2024-05-11 ENCOUNTER — Inpatient Hospital Stay

## 2024-05-12 ENCOUNTER — Inpatient Hospital Stay

## 2024-09-14 ENCOUNTER — Ambulatory Visit: Admitting: Urology
